# Patient Record
Sex: Female | Born: 1965
Health system: Southern US, Community
[De-identification: ages and names within clinical notes are randomized; demographics above are authoritative.]

## PROBLEM LIST (undated history)

## (undated) DIAGNOSIS — C801 Malignant (primary) neoplasm, unspecified: Secondary | ICD-10-CM

## (undated) DIAGNOSIS — I7300 Raynaud's syndrome without gangrene: Secondary | ICD-10-CM

## (undated) DIAGNOSIS — K589 Irritable bowel syndrome without diarrhea: Secondary | ICD-10-CM

## (undated) DIAGNOSIS — M542 Cervicalgia: Secondary | ICD-10-CM

## (undated) DIAGNOSIS — D649 Anemia, unspecified: Secondary | ICD-10-CM

## (undated) DIAGNOSIS — I8500 Esophageal varices without bleeding: Secondary | ICD-10-CM

## (undated) DIAGNOSIS — C220 Liver cell carcinoma: Secondary | ICD-10-CM

## (undated) DIAGNOSIS — E739 Lactose intolerance, unspecified: Secondary | ICD-10-CM

## (undated) DIAGNOSIS — K766 Portal hypertension: Secondary | ICD-10-CM

## (undated) DIAGNOSIS — J449 Chronic obstructive pulmonary disease, unspecified: Secondary | ICD-10-CM

## (undated) DIAGNOSIS — C50919 Malignant neoplasm of unspecified site of unspecified female breast: Secondary | ICD-10-CM

## (undated) DIAGNOSIS — D696 Thrombocytopenia, unspecified: Secondary | ICD-10-CM

## (undated) DIAGNOSIS — K7460 Unspecified cirrhosis of liver: Secondary | ICD-10-CM

## (undated) DIAGNOSIS — R519 Headache, unspecified: Secondary | ICD-10-CM

## (undated) DIAGNOSIS — K219 Gastro-esophageal reflux disease without esophagitis: Secondary | ICD-10-CM

## (undated) DIAGNOSIS — B1920 Unspecified viral hepatitis C without hepatic coma: Secondary | ICD-10-CM

## (undated) HISTORY — DX: Malignant (primary) neoplasm, unspecified: C80.1

## (undated) HISTORY — DX: Irritable bowel syndrome, unspecified: K58.9

## (undated) HISTORY — DX: Portal hypertension: K76.6

## (undated) HISTORY — DX: Unspecified cirrhosis of liver: K74.60

## (undated) HISTORY — DX: Lactose intolerance, unspecified: E73.9

## (undated) HISTORY — PX: COLONOSCOPY: SHX174

## (undated) HISTORY — DX: Cervicalgia: M54.2

## (undated) HISTORY — DX: Unspecified viral hepatitis C without hepatic coma: B19.20

## (undated) HISTORY — DX: Raynaud's syndrome without gangrene: I73.00

## (undated) HISTORY — DX: Thrombocytopenia, unspecified: D69.6

## (undated) HISTORY — DX: Gastro-esophageal reflux disease without esophagitis: K21.9

## (undated) SURGERY — ESOPHAGOSCOPY, WITH ESOPHAGEAL VARICES BAND LIGATION
Anesthesia: Monitor Anesthesia Care

---
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
## 1990-11-29 HISTORY — PX: PELVIC LAPAROSCOPY: SHX162

## 2000-11-29 HISTORY — PX: UPPER GASTROINTESTINAL ENDOSCOPY: SHX188

## 2002-03-20 ENCOUNTER — Encounter: Admission: RE | Admit: 2002-03-20 | Discharge: 2002-03-20 | Payer: Self-pay | Admitting: Endocrinology

## 2002-03-20 ENCOUNTER — Encounter: Payer: Self-pay | Admitting: Endocrinology

## 2002-08-16 ENCOUNTER — Encounter: Payer: Self-pay | Admitting: Endocrinology

## 2002-08-16 ENCOUNTER — Encounter: Admission: RE | Admit: 2002-08-16 | Discharge: 2002-08-16 | Payer: Self-pay | Admitting: Endocrinology

## 2002-09-28 ENCOUNTER — Encounter: Payer: Self-pay | Admitting: Pulmonary Disease

## 2002-09-28 ENCOUNTER — Ambulatory Visit (HOSPITAL_COMMUNITY): Admission: RE | Admit: 2002-09-28 | Discharge: 2002-09-28 | Payer: Self-pay | Admitting: Pulmonary Disease

## 2002-10-02 ENCOUNTER — Other Ambulatory Visit: Admission: RE | Admit: 2002-10-02 | Discharge: 2002-10-02 | Payer: Self-pay | Admitting: Gynecology

## 2002-12-13 ENCOUNTER — Encounter: Payer: Self-pay | Admitting: Internal Medicine

## 2003-05-10 ENCOUNTER — Encounter (INDEPENDENT_AMBULATORY_CARE_PROVIDER_SITE_OTHER): Payer: Self-pay | Admitting: *Deleted

## 2003-05-10 ENCOUNTER — Ambulatory Visit (HOSPITAL_COMMUNITY): Admission: RE | Admit: 2003-05-10 | Discharge: 2003-05-10 | Payer: Self-pay

## 2003-06-25 ENCOUNTER — Encounter: Payer: Self-pay | Admitting: Internal Medicine

## 2003-07-26 ENCOUNTER — Ambulatory Visit (HOSPITAL_COMMUNITY): Admission: RE | Admit: 2003-07-26 | Discharge: 2003-07-26 | Payer: Self-pay | Admitting: Internal Medicine

## 2003-07-26 ENCOUNTER — Encounter: Payer: Self-pay | Admitting: Internal Medicine

## 2003-10-01 ENCOUNTER — Other Ambulatory Visit: Admission: RE | Admit: 2003-10-01 | Discharge: 2003-10-01 | Payer: Self-pay | Admitting: Gynecology

## 2004-03-17 ENCOUNTER — Encounter: Payer: Self-pay | Admitting: Internal Medicine

## 2004-06-17 ENCOUNTER — Ambulatory Visit (HOSPITAL_COMMUNITY): Admission: RE | Admit: 2004-06-17 | Discharge: 2004-06-17 | Payer: Self-pay | Admitting: Gynecology

## 2004-06-17 ENCOUNTER — Encounter (INDEPENDENT_AMBULATORY_CARE_PROVIDER_SITE_OTHER): Payer: Self-pay | Admitting: Specialist

## 2004-08-10 ENCOUNTER — Emergency Department (HOSPITAL_COMMUNITY): Admission: EM | Admit: 2004-08-10 | Discharge: 2004-08-10 | Payer: Self-pay | Admitting: Emergency Medicine

## 2005-05-24 ENCOUNTER — Ambulatory Visit: Payer: Self-pay | Admitting: Pulmonary Disease

## 2005-09-01 ENCOUNTER — Other Ambulatory Visit: Admission: RE | Admit: 2005-09-01 | Discharge: 2005-09-01 | Payer: Self-pay | Admitting: Gynecology

## 2006-01-20 ENCOUNTER — Ambulatory Visit: Payer: Self-pay | Admitting: Gastroenterology

## 2006-03-11 ENCOUNTER — Ambulatory Visit (HOSPITAL_COMMUNITY): Admission: RE | Admit: 2006-03-11 | Discharge: 2006-03-11 | Payer: Self-pay | Admitting: Interventional Radiology

## 2006-03-18 ENCOUNTER — Ambulatory Visit: Payer: Self-pay | Admitting: Pulmonary Disease

## 2006-07-26 ENCOUNTER — Encounter: Admission: RE | Admit: 2006-07-26 | Discharge: 2006-08-04 | Payer: Self-pay | Admitting: Pulmonary Disease

## 2007-02-23 ENCOUNTER — Other Ambulatory Visit: Admission: RE | Admit: 2007-02-23 | Discharge: 2007-02-23 | Payer: Self-pay | Admitting: Gynecology

## 2007-03-22 ENCOUNTER — Ambulatory Visit: Payer: Self-pay | Admitting: Internal Medicine

## 2007-03-22 LAB — CONVERTED CEMR LAB
ALT: 142 units/L — ABNORMAL HIGH (ref 0–40)
Albumin: 3.5 g/dL (ref 3.5–5.2)
Alkaline Phosphatase: 81 units/L (ref 39–117)
Basophils Absolute: 0.1 10*3/uL (ref 0.0–0.1)
Basophils Relative: 0.6 % (ref 0.0–1.0)
Calcium: 9.7 mg/dL (ref 8.4–10.5)
Eosinophils Absolute: 0.1 10*3/uL (ref 0.0–0.6)
Glucose, Bld: 96 mg/dL (ref 70–99)
HCT: 43.3 % (ref 36.0–46.0)
Lymphocytes Relative: 24.4 % (ref 12.0–46.0)
MCHC: 35.1 g/dL (ref 30.0–36.0)
MCV: 93.1 fL (ref 78.0–100.0)
Neutro Abs: 5.9 10*3/uL (ref 1.4–7.7)
Platelets: 225 10*3/uL (ref 150–400)
RDW: 12.4 % (ref 11.5–14.6)
Total Bilirubin: 0.8 mg/dL (ref 0.3–1.2)

## 2007-03-23 ENCOUNTER — Encounter: Payer: Self-pay | Admitting: Internal Medicine

## 2007-04-04 ENCOUNTER — Ambulatory Visit: Payer: Self-pay | Admitting: Internal Medicine

## 2008-07-18 ENCOUNTER — Other Ambulatory Visit: Admission: RE | Admit: 2008-07-18 | Discharge: 2008-07-18 | Payer: Self-pay | Admitting: Obstetrics and Gynecology

## 2008-07-19 ENCOUNTER — Ambulatory Visit (HOSPITAL_COMMUNITY): Admission: RE | Admit: 2008-07-19 | Discharge: 2008-07-19 | Payer: Self-pay | Admitting: Obstetrics and Gynecology

## 2008-07-22 ENCOUNTER — Encounter (INDEPENDENT_AMBULATORY_CARE_PROVIDER_SITE_OTHER): Payer: Self-pay | Admitting: Obstetrics and Gynecology

## 2008-07-22 ENCOUNTER — Inpatient Hospital Stay (HOSPITAL_COMMUNITY): Admission: AD | Admit: 2008-07-22 | Discharge: 2008-07-22 | Payer: Self-pay | Admitting: Obstetrics and Gynecology

## 2010-05-19 ENCOUNTER — Encounter (INDEPENDENT_AMBULATORY_CARE_PROVIDER_SITE_OTHER): Payer: Self-pay | Admitting: *Deleted

## 2010-06-19 ENCOUNTER — Ambulatory Visit: Payer: Self-pay | Admitting: Internal Medicine

## 2010-06-19 DIAGNOSIS — B182 Chronic viral hepatitis C: Secondary | ICD-10-CM | POA: Insufficient documentation

## 2010-06-19 DIAGNOSIS — R1084 Generalized abdominal pain: Secondary | ICD-10-CM | POA: Insufficient documentation

## 2010-06-19 DIAGNOSIS — K219 Gastro-esophageal reflux disease without esophagitis: Secondary | ICD-10-CM | POA: Insufficient documentation

## 2010-06-19 DIAGNOSIS — R197 Diarrhea, unspecified: Secondary | ICD-10-CM | POA: Insufficient documentation

## 2010-06-19 LAB — CONVERTED CEMR LAB: IgA: 121 mg/dL (ref 68–378)

## 2010-06-22 ENCOUNTER — Telehealth: Payer: Self-pay | Admitting: Internal Medicine

## 2010-06-23 ENCOUNTER — Telehealth: Payer: Self-pay | Admitting: Internal Medicine

## 2010-08-26 ENCOUNTER — Ambulatory Visit: Payer: Self-pay | Admitting: Internal Medicine

## 2010-09-01 ENCOUNTER — Encounter: Payer: Self-pay | Admitting: Internal Medicine

## 2010-09-09 ENCOUNTER — Ambulatory Visit: Payer: Self-pay | Admitting: Internal Medicine

## 2010-09-09 DIAGNOSIS — K648 Other hemorrhoids: Secondary | ICD-10-CM | POA: Insufficient documentation

## 2010-09-09 DIAGNOSIS — K589 Irritable bowel syndrome without diarrhea: Secondary | ICD-10-CM | POA: Insufficient documentation

## 2010-11-12 ENCOUNTER — Telehealth (INDEPENDENT_AMBULATORY_CARE_PROVIDER_SITE_OTHER): Payer: Self-pay | Admitting: *Deleted

## 2010-11-29 HISTORY — PX: HAMMER TOE SURGERY: SHX385

## 2010-12-10 ENCOUNTER — Telehealth: Payer: Self-pay | Admitting: Internal Medicine

## 2010-12-20 ENCOUNTER — Encounter: Payer: Self-pay | Admitting: *Deleted

## 2010-12-29 NOTE — Progress Notes (Signed)
Summary: result  Phone Note Call from Patient Call back at [phone]   Caller: Patient Call For: Marina Goodell Reason for Call: Talk to Nurse, Lab or Test Results Summary of Call: Patient wants lab result from Friday. Initial call taken by: Tawni Levy,  June 22, 2010 11:47 AM  Follow-up for Phone Call        No answer,no machine at this number.  Teryl Lucy RN  June 23, 2010 9:18 AM  Notified of lab results.Will keep colon appt. Follow-up by: Teryl Lucy RN,  June 23, 2010 10:56 AM

## 2010-12-29 NOTE — Letter (Signed)
Summary: Patient Notice- Colon Biospy Results  [facility]  [address] [place], [place] [address]   Phone: [phone]  Fax: [phone]        September 01, 2010 MRN: [id number]    Theresa Rocha [address] [place], [place]  [address]    Dear Ms. Muchmore,  I am pleased to inform you that the biopsies taken during your recent colonoscopy were normal with no evidence of microscopic colitis or other abnormalities.  Additional information/recommendations:   __Please call [phone] to schedule a return visit to review      your condition.  __You should have a repeat colonoscopy examination for the purposes of routine screening in approximately 10 years.  Please call us if you are having persistent problems or have questions about your condition that have not been fully answered at this time.  Sincerely,  Hilarie Fredrickson MD   This letter has been electronically signed by your physician.  Appended Document: Patient Notice- Colon Biospy Results letter mailed

## 2010-12-29 NOTE — Assessment & Plan Note (Signed)
Summary: Followup post colonoscopy (IBS-diarrhea)   History of Present Illness Visit Type: Follow-up Visit Primary GI MD: Yancey Flemings MD Primary Provider: Renford Dills, MD  Requesting Provider: na Chief Complaint: F/u from colon. Pt c/o diarrhea  History of Present Illness:   45 year old with GERD complicated by peptic stricture, hepatitis C, psoriasis, and diarrhea predominant irritable bowel syndrome. Recent evaluations for ongoing problems with diarrhea. Negative celiac testing. Complete colonoscopy with intubation of the terminal ileum August 26, 2010 was normal. Random colon biopsies and normal. She was prescribed Levbid. Twice daily dosage resulted in some dizziness. Once daily dosage tolerated and may be somewhat helpful. Generally has one loose bowel movement in the morning after her meal. 3-4 days per month, however she will have multiple loose postprandial bowel movements. She has not tried other therapies. We have reviewed her workup. She does complain of some rectal discomfort with defecation when her diarrhea is worse.   GI Review of Systems      Denies abdominal pain, acid reflux, belching, bloating, chest pain, dysphagia with liquids, dysphagia with solids, heartburn, loss of appetite, nausea, vomiting, vomiting blood, weight loss, and  weight gain.      Reports diarrhea.     Denies anal fissure, black tarry stools, change in bowel habit, constipation, diverticulosis, fecal incontinence, heme positive stool, hemorrhoids, irritable bowel syndrome, jaundice, light color stool, liver problems, rectal bleeding, and  rectal pain.    Current Medications (verified): 1)  Levbid 0.375 Mg Xr12h-Tab (Hyoscyamine Sulfate) .... Take 1 By Mouth Two Times A Day 2)  Hydrocodone-Acetaminophen 7.5-325 Mg Tabs (Hydrocodone-Acetaminophen) .... As Needed For Pain 3)  Promethazine Hcl 25 Mg Tabs (Promethazine Hcl) .... Will Take With Pain Medicine  Allergies (verified): 1)  ! Asa 2)  ! *  Dilaudid  Past History:  Past Medical History: Chronic Hepatitis C Irritable Bowel Syndrome Esophageal Stricture Internal hemorrhoids  GERD  Past Surgical History: Left foot surgery   Family History: Lung Cancer: Father Family History of Diabetes: MGM Family History of Liver Disease/Cirrhosis:MGM No FH of Colon Cancer:  Social History: Reviewed history from 06/19/2010 and no changes required. Occupation: Interventional Radiology Patient currently smokes.  Alcohol Use - yes Daily Caffeine Use Illicit Drug Use - no  Review of Systems  The patient denies allergy/sinus, anemia, anxiety-new, arthritis/joint pain, back pain, blood in urine, breast changes/lumps, change in vision, confusion, cough, coughing up blood, depression-new, fainting, fatigue, fever, headaches-new, hearing problems, heart murmur, heart rhythm changes, itching, menstrual pain, muscle pains/cramps, night sweats, nosebleeds, pregnancy symptoms, shortness of breath, skin rash, sleeping problems, sore throat, swelling of feet/legs, swollen lymph glands, thirst - excessive , urination - excessive , urination changes/pain, urine leakage, vision changes, and voice change.    Vital Signs:  Patient profile:   45 year old female Height:      63 inches Weight:      112 pounds BMI:     19.91 BSA:     1.51 Pulse rate:   88 / minute Pulse rhythm:   regular BP sitting:   120 / 60  (left arm) Cuff size:   regular  Vitals Entered By: Ok Anis CMA (September 09, 2010 2:07 PM)  Physical Exam  General:  Well developed, well nourished, no acute distress. Lungs:  Clear throughout to auscultation. Heart:  Regular rate and rhythm; no murmurs, rubs,  or bruits. Abdomen:  Soft, nontender and nondistended. No masses, hepatosplenomegaly or hernias noted. Normal bowel sounds. Pulses:  Normal pulses noted. Neurologic:  alert and oriented Skin:  psoriasis Psych:  Alert and cooperative. Normal mood and affect.   Impression  & Recommendations:  Problem # 1:  IBS (ICD-564.1) diarrhea predominant irritable bowel syndrome. Ongoing.  Plan: #1. Probiotic Align one daily for 2 weeks. Samples given #2. Prescribed Librax one p.o. a.c. and h.s. p.r.n. Do not take with Levbid #3. Imodium p.r.n. #4. Follow p.r.n.  Problem # 2:  HEMORRHOIDS, INTERNAL (ICD-455.0) occasional intermittent discomfort in the rectum likely due to known hemorrhoids  Plan: #1. Anusol-HC suppositories p.r.n.Marland Kitchen Prescribed  Problem # 3:  GERD (ICD-530.81) currently asymptomatic.  Problem # 4:  HEPATITIS C-CHRONIC WITHOUT COMA (ICD-070.54) followed previously at the medical specialist clinic. They wish to reestablish care given newer therapies  Patient Instructions: 1)  Align Samples given to patient to take 1 by mouth once daily 2)  x 2 weeks. 3)  Librax Rx. sent to pharmacy #100 take 1 before each meal and at bedtime as needed 4)  Take Immodium as needed (purchase over the counter) 5)  annusol HC Supp. #30 1 per rectum once daily as needed 6)  Copy sent to : Renford Dills, MD  7)  The medication list was reviewed and reconciled.  All changed / newly prescribed medications were explained.  A complete medication list was provided to the patient / caregiver. Prescriptions: ANUSOL-HC 25 MG SUPP (HYDROCORTISONE ACETATE) 1 per rectum once daily as needed  #30 x 1   Entered by:   Milford Cage NCMA   Authorized by:   Hilarie Fredrickson MD   Signed by:   Milford Cage NCMA on 09/09/2010   Method used:   Electronically to        [id number] (retail)       [address]       [place], [place]  [address]       Ph: [phone] or [phone]       Fax: [phone]   RxID:   [id number] LIBRAX 2.5-5 MG CAPS (CLIDINIUM-CHLORDIAZEPOXIDE) 1 by mouth before meals and at bedtime as needed  #100 x 3   Entered by:   Milford Cage NCMA   Authorized by:   Hilarie Fredrickson MD   Signed by:   Milford Cage NCMA on 09/09/2010   Method used:    Electronically to        [facility] [phone]* (retail)       [address]       [place], [place]  [address]       Ph: [phone] or [phone]       Fax: [phone]   RxID:   [id number]

## 2010-12-29 NOTE — Letter (Signed)
Summary: New Patient letter  [facility]  [address] [place], [place] [address]   Phone: [phone]  Fax: [phone]       05/19/2010 MRN: [id number]  Theresa Rocha [address] [place], [place]  [address]  Dear Ms. Theresa Rocha,  Welcome to the Gastroenterology Division at [facility].    You are scheduled to see Dr.  Marina Goodell on 06-19-10 at 3:30p.m. on the 3rd floor at [facility], [address]  We ask that you try to arrive at our office 15 minutes prior to your appointment time to allow for check-in.  We would like you to complete the enclosed self-administered evaluation form prior to your visit and bring it with you on the day of your appointment.  We will review it with you.  Also, please bring a complete list of all your medications or, if you prefer, bring the medication bottles and we will list them.  Please bring your insurance card so that we may make a copy of it.  If your insurance requires a referral to see a specialist, please bring your referral form from your primary care physician.  Co-payments are due at the time of your visit and may be paid by cash, check or credit card.     Your office visit will consist of a consult with your physician (includes a physical exam), any laboratory testing he/she may order, scheduling of any necessary diagnostic testing (e.g. x-ray, ultrasound, CT-scan), and scheduling of a procedure (e.g. Endoscopy, Colonoscopy) if required.  Please allow enough time on your schedule to allow for any/all of these possibilities.    If you cannot keep your appointment, please call [phone] to cancel or reschedule prior to your appointment date.  This allows [place] the opportunity to schedule an appointment for another patient in need of care.  If you do not cancel or reschedule by 5 p.m. the business day prior to your appointment date, you will be charged a $50.00 late cancellation/no-show fee.    Thank you for choosing [facility] for your medical needs.  We appreciate the opportunity to care for you.  Please visit [place] at our website  to learn more about our practice.                     Sincerely,                                                             The Gastroenterology Division

## 2010-12-29 NOTE — Progress Notes (Signed)
Summary: ? re meds  Phone Note Call from Patient Call back at [phone]   Caller: Patient Call For: Marina Goodell Reason for Call: Talk to Nurse Summary of Call: Patient has questions regarding her medication Initial call taken by: Tawni Levy,  June 23, 2010 3:48 PM  Follow-up for Phone Call        No answer-no machine.  Teryl Lucy RN  June 23, 2010 4:30 p.m. Pt. requesting to cut back Levbid to once a day.Has worked in decreasing stool right after eating and cramping but feels b.i.d. dose makes her dizzy. Follow-up by: Teryl Lucy RN,  June 24, 2010 9:18 AM  Additional Follow-up for Phone Call Additional follow up Details #1::        she can try one daily or 1/2 two times a day. Additional Follow-up by: Hilarie Fredrickson MD,  June 24, 2010 12:00 PM    Additional Follow-up for Phone Call Additional follow up Details #2::    Pt. ntfd. of Dr.Ambrielle Kington's recommendations Follow-up by: Teryl Lucy RN,  June 24, 2010 12:20 PM

## 2010-12-29 NOTE — Consult Note (Signed)
Summary: [employer] [facility] [employer]   Imported By: Sherian Rein 08/25/2010 07:37:52  _____________________________________________________________________  External Attachment:    Type:   Image     Comment:   External Document

## 2010-12-29 NOTE — Progress Notes (Signed)
Summary: [facility]   Imported By: Sherian Rein 08/25/2010 07:39:06  _____________________________________________________________________  External Attachment:    Type:   Image     Comment:   External Document

## 2010-12-29 NOTE — Letter (Signed)
Summary: [facility] Instructions  [facility]  [address] [place], [place] [address]   Phone: [phone]  Fax: [phone]       ELVERNA CAFFEE    1966/11/21    MRN: [id number]        Procedure Day /Date:THURSDAY, 08/26/10     Arrival Time:7:30 AM     Procedure Time:8:00 AM     Location of Procedure:                    [facility] (4th Floor)                        PREPARATION FOR COLONOSCOPY WITH MOVIPREP   Starting 5 days prior to your procedure 08/21/10 eat nuts, seeds, popcorn, corn, beans, peas,  salads, or any raw vegetables.  Do not take any fiber supplements (e.g. Metamucil, Citrucel, and Benefiber).  THE DAY BEFORE YOUR PROCEDURE         DATE: 08/25/10 WEDNESDAY  1.  Drink clear liquids the entire day-NO SOLID FOOD  2.  Do not drink anything colored red or purple.  Avoid juices with pulp.  No orange juice.  3.  Drink at least 64 oz. (8 glasses) of fluid/clear liquids during the day to prevent dehydration and help the prep work efficiently.  CLEAR LIQUIDS INCLUDE: Water Jello Ice Popsicles Tea (sugar ok, no milk/cream) Powdered fruit flavored drinks Coffee (sugar ok, no milk/cream) Gatorade Juice: apple, white grape, white cranberry  Lemonade Clear bullion, consomm, broth Carbonated beverages (any kind) Strained chicken noodle soup Hard Candy                             4.  In the morning, mix first dose of MoviPrep solution:    Empty 1 Pouch A and 1 Pouch B into the disposable container    Add lukewarm drinking water to the top line of the container. Mix to dissolve    Refrigerate (mixed solution should be used within 24 hrs)  5.  Begin drinking the prep at 5:00 p.m. The MoviPrep container is divided by 4 marks.   Every 15 minutes drink the solution down to the next mark (approximately 8 oz) until the full liter is complete.   6.  Follow completed prep with 16 oz of clear liquid of your choice (Nothing red or purple).   Continue to drink clear liquids until bedtime.  7.  Before going to bed, mix second dose of MoviPrep solution:    Empty 1 Pouch A and 1 Pouch B into the disposable container    Add lukewarm drinking water to the top line of the container. Mix to dissolve    Refrigerate  THE DAY OF YOUR PROCEDURE      DATE  08/26/10 THURSDAY  Beginning at 3:00 AM hours before procedure):         1. Every 15 minutes, drink the solution down to the next mark (approx 8 oz) until the full liter is complete.  2. Follow completed prep with 16 oz. of clear liquid of your choice.    3. You may drink clear liquids until 6:00 AM HOURS BEFORE PROCEDURE).   MEDICATION INSTRUCTIONS  Unless otherwise instructed, you should take regular prescription medications with a small sip of water   as early as possible the morning of your procedure.         OTHER  INSTRUCTIONS  You will need a responsible adult at least 44 years of age to accompany you and drive you home.   This person must remain in the waiting room during your procedure.  Wear loose fitting clothing that is easily removed.  Leave jewelry and other valuables at home.  However, you may wish to bring a book to read or  an iPod/MP3 player to listen to music as you wait for your procedure to start.  Remove all body piercing jewelry and leave at home.  Total time from sign-in until discharge is approximately 2-3 hours.  You should go home directly after your procedure and rest.  You can resume normal activities the  day after your procedure.  The day of your procedure you should not:   Drive   Make legal decisions   Operate machinery   Drink alcohol   Return to work  You will receive specific instructions about eating, activities and medications before you leave.    The above instructions have been reviewed and explained to me by   _______________________    I fully understand and can verbalize these instructions  _____________________________ Date _________

## 2010-12-29 NOTE — Procedures (Signed)
Summary: EGD   EGD  Procedure date:  07/26/2003  Findings:      Location: [facility]  Findings: Esophagitis  Findings: Stricture:  GERD Patient Name: Lodema, Parma MRN:  Procedure Procedures: Panendoscopy (EGD) CPT: 43235.    with esophageal dilation. CPT: G9296129.  Personnel: Endoscopist: Wilhemina Bonito. Marina Goodell, MD.  Exam Location: Exam performed in Endoscopy Suite.  Patient Consent: Procedure, Alternatives, Risks and Benefits discussed, consent obtained,  Indications Symptoms: Dysphagia.  History  Pre-Exam Physical: Performed Jul 26, 2003  Entire physical exam was normal.  Exam Exam Info: Maximum depth of insertion Duodenum, intended Duodenum. Patient position: on left side. Vocal cords visualized. Gastric retroflexion performed. Images taken. ASA Classification: II. Tolerance: excellent.  Sedation Meds: Demerol 70 mg. given IV. Versed 7 mg. given IV.  Monitoring: BP and pulse monitoring done. Oximetry used. Supplemental O2 given  Fluoroscopy: Fluoroscopy was used.  Findings HIATAL HERNIA: Comments: small.  STRICTURE / STENOSIS: Stricture in Distal Esophagus.  Constriction: partial. Etiology: benign due to reflux. 38 cm from mouth. Lumen diameter is 14 mm. ICD9: Esophageal Stricture: 530.3. Comment: mild esophagitis (edema/erythema) also present.  - Dilation: Distal Esophagus. Procedure was performed under Fluoroscopy. Wire Guided/Savary (Wilson-Cook) dilator used, Diameter: 17 mm, No Resistance, No Heme present on extraction. 1  total dilators used. Patient tolerance excellent.   Assessment Abnormal examination, see findings above.  Diagnoses: 530.3: Esophageal Stricture.  530.11: Esophagitis, Reflux.  530.81: GERD.   Events  Unplanned Intervention: No unplanned interventions were required.  Unplanned Events: There were no complications. Plans Instructions: Nothing to eat or drink for 2hrs.  Clear or full liquids: 2 hrs. Resume previous diet: am.    Medication(s): PPI: Esomeprazole/Nexium 40 mg QD,   Disposition: After procedure patient sent to recovery. After recovery patient sent home.   This report was created from the original endoscopy report, which was reviewed and signed by the above listed endoscopist.   cc:  Alroy Dust, MD      The Patient

## 2010-12-29 NOTE — Assessment & Plan Note (Signed)
Summary: DIARRHEA//ABD PAIN--   History of Present Illness Visit Type: Initial Visit Primary GI MD: Yancey Flemings MD Primary Provider: Renford Dills, MD Chief Complaint: Chornic diarrhea, severe lower abdominla pain x6 months History of Present Illness:   45 year old with a history of GERD complicated by peptic stricture, hepatitis C, probable diarrhea predominant irritable bowel syndrome, and psoriasis. She presents today for worsening abdominal pain and diarrhea. She has had chronic intermittent problems with cramping lower abdominal discomfort and diarrhea, generally after meals. Particularly the morning meal. Her problems have been more severe over the past 6 months. Her weight has been stable and there has been no bleeding. She does complain of bloating and nausea as well as acid reflux. No dysphagia. Her weight has been stable.   GI Review of Systems    Reports abdominal pain, acid reflux, bloating, and  nausea.     Location of  Abdominal pain: lower abdomen.    Denies belching, chest pain, dysphagia with liquids, dysphagia with solids, heartburn, loss of appetite, vomiting, vomiting blood, weight loss, and  weight gain.      Reports diarrhea and  liver problems.     Denies anal fissure, black tarry stools, change in bowel habit, constipation, diverticulosis, fecal incontinence, heme positive stool, hemorrhoids, irritable bowel syndrome, jaundice, light color stool, rectal bleeding, and  rectal pain. Preventive Screening-Counseling & Management  Alcohol-Tobacco     Smoking Status: current      Drug Use:  no.      Current Medications (verified): 1)  None  Allergies (verified): 1)  ! Asa 2)  ! * Dilaudid  Past History:  Past Medical History: Chronic Hepatitis C Irritable Bowel Syndrome Esophageal Stricture  Past Surgical History: Unremarkable  Family History: Lung Cancer: Father Family History of Diabetes: MGM Family History of Liver Disease/Cirrhosis:MGM  Social  History: Occupation: Interventional Radiology Patient currently smokes.  Alcohol Use - yes Daily Caffeine Use Illicit Drug Use - no Smoking Status:  current Drug Use:  no  Review of Systems       The patient complains of fatigue.  The patient denies allergy/sinus, anemia, anxiety-new, arthritis/joint pain, back pain, blood in urine, breast changes/lumps, change in vision, confusion, cough, coughing up blood, depression-new, fainting, fever, headaches-new, hearing problems, heart murmur, heart rhythm changes, itching, menstrual pain, muscle pains/cramps, night sweats, nosebleeds, pregnancy symptoms, shortness of breath, skin rash, sleeping problems, sore throat, swelling of feet/legs, swollen lymph glands, thirst - excessive , urination - excessive , urination changes/pain, urine leakage, vision changes, and voice change.    Vital Signs:  Patient profile:   45 year old female Height:      63 inches Weight:      113.50 pounds BMI:     20.18 Pulse rate:   80 / minute Pulse rhythm:   regular BP sitting:   92 / 58  (left arm) Cuff size:   regular  Vitals Entered By: June McMurray CMA Duncan Dull) (June 19, 2010 3:22 PM)  Physical Exam  General:  Well developed, well nourished, no acute distress. Head:  Normocephalic and atraumatic. Eyes:  PERRLA, no icterus. Nose:  No deformity, discharge,  or lesions. Mouth:  No deformity or lesions Neck:  Supple; no masses or thyromegaly. Lungs:  Clear throughout to auscultation. Heart:  Regular rate and rhythm; no murmurs, rubs,  or bruits. Abdomen:  Soft, nontender and nondistended. No masses, hepatosplenomegaly or hernias noted. Normal bowel sounds. Rectal:  deferred until colonoscopy Msk:  Symmetrical with no gross deformities.  Normal posture. Pulses:  Normal pulses noted. Extremities:  No clubbing, cyanosis, edema or deformities noted. Neurologic:  Alert and  oriented x4; Skin:  psoriatic plaques on the legs and arms Psych:  Alert and  cooperative. Normal mood and affect.   Impression & Recommendations:  Problem # 1:  DIARRHEA (ICD-787.91) problems with chronic postprandial diarrhea associated with abdominal cramping. Felt to have irritable bowel syndrome. Problems worse over the past 6 months.  Plan: #1. Obtain tissue transglutaminase antibody and serum IgA level to screen for celiac sprue #2. Colonoscopy with biopsies to exclude microscopic colitis. Can also evaluate for ileal Crohn's disease. The nature of the procedure as well as the risks, benefits, and alternatives were reviewed. She understood and agreed to proceed #3. Movi prep prescribed. Patient instructed on its use  Problem # 2:  ABDOMINAL PAIN -GENERALIZED (ICD-789.07) pain felt to be secondary to irritable bowel. Ruling out other conditions as outlined above.  Plan: #1. Prescribed Levbid 0.375 mg p.o. b.i.d. p.r.n.  Problem # 3:  GERD (ICD-530.81) having symptoms on no medication. Does have a history of peptic stricture though no dysphagia now.  Plan: #1. PPI therapy on demand  Problem # 4:  HEPATITIS C-CHRONIC  (ICD-070.54) previous evaluation per medical specialist clinic  Other Orders: Colonoscopy (Colon) T-Tissue Transglutamase Ab IgA ([id number]) TLB-IgA (Immunoglobulin A) (82784-IGA)  Patient Instructions: 1)  Colon [facility] 08/26/10 8:00 am arrive at 7:00 am 2)  Movi prep instructions given and Rx. for Movi prep and Levbid sent to pharmacy. 3)  Labs ordered for you to have drawn today . 4)  Colonoscopy and Flexible Sigmoidoscopy brochure given.  5)  The medication list was reviewed and reconciled.  All changed / newly prescribed medications were explained.  A complete medication list was provided to the patient / caregiver. 6)  Copy: Dr. Renford Dills Prescriptions: LEVBID 0.375 MG XR12H-TAB (HYOSCYAMINE SULFATE) take 1 by mouth two times a day  #60 x 6   Entered by:   Milford Cage NCMA   Authorized by:   Hilarie Fredrickson MD   Signed by:    Milford Cage NCMA on 06/19/2010   Method used:   Electronically to        [facility] [phone]* (retail)       [address]       [place], [place]  [address]       Ph: [phone] or [phone]       Fax: [phone]   RxID:   [id number] MOVIPREP 100 GM  SOLR (PEG-KCL-NACL-NASULF-NA ASC-C) As per prep instructions.  #1 x 0   Entered by:   Milford Cage NCMA   Authorized by:   Hilarie Fredrickson MD   Signed by:   Milford Cage NCMA on 06/19/2010   Method used:   Electronically to        [phone] (retail)       [address]       [place], [place]  [address]       Ph: [phone] or [phone]       Fax: [phone]   RxID:   [id number]

## 2010-12-29 NOTE — Procedures (Signed)
Summary: Colonoscopy  Patient: Theresa Rocha Note: All result statuses are Final unless otherwise noted.  Tests: (1) Colonoscopy (COL)   COL Colonoscopy           DONE     [facility]     [address]     [place], [place]  [address]           COLONOSCOPY PROCEDURE REPORT           PATIENT:  Theresa Rocha, Theresa Rocha  MR#:  [id number]     BIRTHDATE:  05/13/1966, 44 yrs. old  GENDER:  female     ENDOSCOPIST:  Wilhemina Bonito. Eda Keys, MD     REF. BY:  Office     PROCEDURE DATE:  08/26/2010     PROCEDURE:  Diagnostic Colonoscopy     ASA CLASS:  Class II     INDICATIONS:  unexplained diarrhea, Abdominal pain     MEDICATIONS:   Fentanyl 100 mcg IV, Versed 10 mg IV           DESCRIPTION OF PROCEDURE:   After the risks benefits and     alternatives of the procedure were thoroughly explained, informed     consent was obtained.  Digital rectal exam was performed and     revealed no abnormalities.   The LB CF-H180AL [id number] endoscope     was introduced through the anus and advanced to the cecum, which     was identified by both the appendix and ileocecal valve, without     limitations.Time to cecum = 5:25 min.  The quality of the prep was     good, using MoviPrep.  The instrument was then slowly withdrawn     (time = 8:40 min) as the colon was fully examined.     <<PROCEDUREIMAGES>>           FINDINGS:  A normal appearing cecum, ileocecal valve, and     appendiceal orifice were identified. The ascending, hepatic     flexure, transverse, splenic flexure, descending, sigmoid colon,     and rectum appeared unremarkable. Random colon biopsies taken. The     terminal ileum appeared normal.   Retroflexed views in the rectum     revealed internal hemorrhoids.    The scope was then withdrawn     from the patient and the procedure completed.           COMPLICATIONS:  None     ENDOSCOPIC IMPRESSION:     1) Normal colon     2) Normal terminal ileum     3) Internal hemorrhoids     4) Suspect Irritable  bowel syndrome           RECOMMENDATIONS:     1) Await biopsy results     2) Continue current colorectal screening recommendations for     "routine risk" patients with a repeat colonoscopy in 10 years.     3) call office next 1-3 days to schedule followup visit in a few     weeks to review results and recommendations           ______________________________     Wilhemina Bonito. Eda Keys, MD           CC:  Renford Dills MD; The Patient           n.     eSIGNED:   Wilhemina Bonito. Eda Keys at 08/26/2010 08:55 AM  Kandy, Towery, [phone]  Note: An exclamation mark (!) indicates a result that was not dispersed into the flowsheet. Document Creation Date: 08/26/2010 8:57 AM _______________________________________________________________________  (1) Order result status: Final Collection or observation date-time: 08/26/2010 08:47 Requested date-time:  Receipt date-time:  Reported date-time:  Referring Physician:   Ordering Physician: Fransico Setters [phone]) Specimen Source:  Source: Launa Grill Order Number: [id number] Lab site:   Appended Document: Colonoscopy    Clinical Lists Changes  Observations: Added new observation of COLONNXTDUE: 07/2020 (08/26/2010 11:00)      Appended Document: Colonoscopy     Procedures Next Due Date:    Colonoscopy: 07/2020

## 2010-12-31 NOTE — Progress Notes (Signed)
Summary: ? re fmla paperwork  Phone Note Call from Patient Call back at [phone]   Caller: Patient Call For: Dr Marina Goodell Reason for Call: Talk to Nurse Summary of Call: Patient wants to know why were her fmla paperwork denied. Initial call taken by: Tawni Levy,  December 10, 2010 1:13 PM  Follow-up for Phone Call        Explained why Dr.Joushua Dugar does not sign FMLA papers for [employer].Advised she should make r.o.v. appt if meds as ordered aren't working.Marland Kitchen Marland KitchenWorried about taking Immodium .Advised to use as directed on the box and use her Librax on a more regular basis.  Follow-up by: Teryl Lucy RN,  December 14, 2010 10:53 AM

## 2010-12-31 NOTE — Progress Notes (Signed)
  Phone Note Other Incoming   Request: Send information Summary of Call: Completed [place] medical release, [employer] documents and patient's FMLA paperwork received. Information forwarded to [employer].

## 2011-01-01 NOTE — Progress Notes (Signed)
Summary: [facility]   Imported By: Sherian Rein 08/25/2010 07:40:09  _____________________________________________________________________  External Attachment:    Type:   Image     Comment:   External Document

## 2011-04-16 NOTE — Assessment & Plan Note (Signed)
[facility]                         GASTROENTEROLOGY OFFICE NOTE   KALIANN, Theresa Rocha                        MRN:          [id number]  DATE:03/22/2007                            DOB:          1965-12-03    HISTORY:  This is a 45 year old white female with a history of hepatitis  C, psoriasis, and chronic diarrhea felt due to lactose intolerance  and/or irritable bowel.  She was last seen in this office in April of  2005 for diarrhea.  She has not been seen since.  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  She contacted the office  today requesting to be seen for problems with diarrhea.  She reports to  me that she has chronic problems with postprandial diarrhea depending on  what she eats.  Worse with dairy products, though not exclusive to dairy  products.  Also worse with stress.  She apparently left her husband this  past winter, which resulted in increasing problems with diarrhea and  some transient weight loss.  This improved to her baseline and she  actually had some weight gain.  She states she was doing okay until 3 to  4 days ago when she developed worsening problems with diarrhea, multiple  stools per day, almost exclusively worsened with meals.  No nocturnal  symptoms.  Some nausea, but no vomiting.  She has had some mucus in her  stool, but no blood.  She has some sharp cramping in the lower abdomen,  laterally.  She has had this intermittently.  She has mentioned chills,  but no fever.  She has had some antibiotics back in January and last  year for gynecologic infections.  She thinks she was given metronidazole  only.  No other issues or problems.  She states she has been in contact  with the hepatitis outpatient clinic at [facility].   PHYSICAL EXAM:  Thin, tanned female in no acute distress.  Blood pressure 106/72, heart rate is 88, weight is 113.8 pounds.  HEENT:  Sclerae anicteric.  Conjunctivae pink.  Oral  mucosa intact.  No adenopathy.  LUNGS:  Clear.  HEART:  Regular.  ABDOMEN:  Soft without tenderness, mass, or hernia, good bowel sounds  heard.  RECTAL:  Deferred.  EXTREMITIES:  Without edema.   IMPRESSION:  Acute diarrheal illness, possible viral gastroenteritis.  Could be exacerbation of irritable bowel syndrome.  No particularly  worrisome features by history or physical exam.   RECOMMENDATIONS:  1. CBC and comprehensive metabolic panel today.  2. Beta hCG.  The patient requested this, wondering if she is      pregnant, though she states she had a period last weekend but that      they have been scant.  We will have this performed for her.  3. Stool studies including ova and parasite, culture for enteric      pathogens, Clostridium difficile, and fecal leukocytes.  4. Hemoccult studies.  5. Levbid 0.375 mg b.i.d.  6. Lomotil 1 to 2 q.6h p.r.n.  7. Brat diet, advance as tolerated.  8.  Office visit in 2 to 3 weeks if still having problems.     Wilhemina Bonito. Marina Goodell, MD  Electronically Signed    JNP/MedQ  DD: 03/22/2007  DT: 03/22/2007  Job #: [id number]   cc:   Lonzo Cloud. Kriste Basque, MD  Leatha Gilding Mezer, M.D.

## 2011-04-16 NOTE — Assessment & Plan Note (Signed)
[facility]                         GASTROENTEROLOGY OFFICE NOTE   LAYLANA, GERWIG                        MRN:          [id number]  DATE:04/04/2007                            DOB:          01-14-1966    HISTORY:  Theresa Rocha presents today for followup. She was evaluated on March 22, 2007, for an acute diarrheal illness felt to possibly represent a  viral gastroenteritis. See that dictation for details. CBC and beta HCG  were negative. Comprehensive metabolic panel was normal except for  elevated liver tests secondary to known hepatitis C. Stool studies were  negative. Hemoccult cards were not submitted. She took Lomotil and  within a day her symptoms resolved. She states that to me that she will  get urgency and loose stools when she eats certain foods which are  greasy. Otherwise, she is well.   PHYSICAL EXAMINATION:  Finds a well-appearing female in no acute  distress. Blood pressure 100/60, heart rate 82, weight 114.4 pounds  (increased 0.8 pounds).  HEENT: Sclerae anicteric.  ABDOMEN: Soft without tenderness, mass or hernia.  EXTREMITIES: Are without edema.   IMPRESSION:  1. Recent acute diarrheal illness likely secondary to infectious      agent. Probable virus.  2. Background symptoms consistent with irritable bowel syndrome.  3. However, chronic hepatitis C.   RECOMMENDATIONS:  1. Given Levbid to take on an as needed basis for more irritable bowel      syndrome symptoms.  2. [facility] of [facility].  3. Resume general medical care with Dr.  Kriste Basque.     Wilhemina Bonito. Marina Goodell, MD  Electronically Signed    JNP/MedQ  DD: 04/04/2007  DT: 04/04/2007  Job #: [id number]   cc:   Lonzo Cloud. Kriste Basque, MD  Leatha Gilding Mezer, M.D.

## 2011-04-16 NOTE — Op Note (Signed)
NAME:  Theresa Rocha, Theresa Rocha                           ACCOUNT NO.:  [id number]   MEDICAL RECORD NO.:  [id number]                   PATIENT TYPE:  AMB   LOCATION:  SDC                                  FACILITY:  [facility]   PHYSICIAN:  Howard C. Mezer, M.D.               DATE OF BIRTH:  12-Feb-1966   DATE OF PROCEDURE:  06/17/2004  DATE OF DISCHARGE:                                 OPERATIVE REPORT   PREOPERATIVE DIAGNOSES:  Endometrial polyp.   POSTOPERATIVE DIAGNOSES:  Endometrial polyp.   OPERATION PERFORMED:  Hysteroscopy D&C and removal of endometrial polyp.   ANESTHESIA:  MAC plus paracervical block.   SURGEON:  Leatha Gilding. Mezer, M.D.   DESCRIPTION OF PROCEDURE:  With the patient in lithotomy position, she was  prepped and draped in a routine fashion. The uterus sounded to just over 7  cm and the cervix very easily dilated. The hysteroscope was introduced and  Hyskon was used as a distention medium.  The endocervical canal appeared to  be normal. Although the lighting was suboptimal, the cavity was very  adequately explored with a polyp emanating from the anterior left side of  the uterus located.  This had normal vascular markings and was not  suspicious for malignancy. The remainder of the cavity was normal, both  tubal ostia were identified.  Grasping forceps was introduced and the polyp  removed including the base.  The cavity was then sharply curetted productive  of a moderate amount of tissue.  The hysteroscope was reintroduced and no  additional polypoid tissue was noted. There was minimal bleeding into the  procedure.  A paracervical block of 1% Xylocaine had been placed at the  beginning of the procedure. The estimated blood loss was less than 20 mL and  the sponge, instrument and needle counts were correct.  The patient  tolerated the procedure well and was taken to the recovery room in  satisfactory condition.                                               Leatha Gilding. Mezer,  M.D.    HCM/MEDQ  D:  06/17/2004  T:  06/17/2004  Job:  [id number]   cc:   Lonzo Cloud. Kriste Basque, M.D. [facility]

## 2012-01-03 ENCOUNTER — Other Ambulatory Visit: Payer: Self-pay | Admitting: Internal Medicine

## 2012-01-03 DIAGNOSIS — Z1231 Encounter for screening mammogram for malignant neoplasm of breast: Secondary | ICD-10-CM

## 2012-01-06 ENCOUNTER — Other Ambulatory Visit: Payer: Self-pay | Admitting: Obstetrics and Gynecology

## 2012-01-07 ENCOUNTER — Other Ambulatory Visit: Payer: Self-pay | Admitting: Gynecology

## 2012-01-07 DIAGNOSIS — R928 Other abnormal and inconclusive findings on diagnostic imaging of breast: Secondary | ICD-10-CM

## 2012-01-10 ENCOUNTER — Ambulatory Visit
Admission: RE | Admit: 2012-01-10 | Discharge: 2012-01-10 | Disposition: A | Discharge status: Home / Self Care | Payer: BC Managed Care – PPO | Source: Ambulatory Visit | Attending: Gynecology | Admitting: Gynecology

## 2012-01-10 DIAGNOSIS — R928 Other abnormal and inconclusive findings on diagnostic imaging of breast: Secondary | ICD-10-CM

## 2012-01-11 ENCOUNTER — Ambulatory Visit: Payer: Self-pay

## 2013-05-11 ENCOUNTER — Other Ambulatory Visit: Payer: Self-pay | Admitting: Internal Medicine

## 2013-05-11 ENCOUNTER — Other Ambulatory Visit (HOSPITAL_COMMUNITY): Payer: Self-pay | Admitting: Internal Medicine

## 2013-05-11 ENCOUNTER — Ambulatory Visit (HOSPITAL_COMMUNITY)
Admission: RE | Admit: 2013-05-11 | Discharge: 2013-05-11 | Disposition: A | Discharge status: Home / Self Care | Payer: BC Managed Care – PPO | Source: Ambulatory Visit | Attending: Internal Medicine | Admitting: Internal Medicine

## 2013-05-11 DIAGNOSIS — Z801 Family history of malignant neoplasm of trachea, bronchus and lung: Secondary | ICD-10-CM | POA: Insufficient documentation

## 2013-05-11 DIAGNOSIS — F17200 Nicotine dependence, unspecified, uncomplicated: Secondary | ICD-10-CM | POA: Insufficient documentation

## 2013-05-11 DIAGNOSIS — R059 Cough, unspecified: Secondary | ICD-10-CM

## 2013-05-11 DIAGNOSIS — R05 Cough: Secondary | ICD-10-CM | POA: Insufficient documentation

## 2013-05-11 DIAGNOSIS — F17210 Nicotine dependence, cigarettes, uncomplicated: Secondary | ICD-10-CM

## 2013-05-11 MED ORDER — IOHEXOL 300 MG/ML  SOLN
80.0000 mL | Freq: Once | INTRAMUSCULAR | Status: AC | PRN
  Administered 2013-05-11: 80 mL via INTRAVENOUS

## 2013-05-14 ENCOUNTER — Ambulatory Visit (HOSPITAL_COMMUNITY): Payer: BC Managed Care – PPO

## 2013-06-13 ENCOUNTER — Other Ambulatory Visit (HOSPITAL_COMMUNITY): Payer: Self-pay | Admitting: Internal Medicine

## 2013-06-13 DIAGNOSIS — B182 Chronic viral hepatitis C: Secondary | ICD-10-CM

## 2013-06-15 ENCOUNTER — Ambulatory Visit (HOSPITAL_COMMUNITY)
Admission: RE | Admit: 2013-06-15 | Discharge: 2013-06-15 | Disposition: A | Discharge status: Home / Self Care | Payer: BC Managed Care – PPO | Source: Ambulatory Visit | Attending: Internal Medicine | Admitting: Internal Medicine

## 2013-06-15 DIAGNOSIS — K7689 Other specified diseases of liver: Secondary | ICD-10-CM | POA: Insufficient documentation

## 2013-06-15 DIAGNOSIS — B182 Chronic viral hepatitis C: Secondary | ICD-10-CM

## 2014-05-20 ENCOUNTER — Encounter: Payer: Self-pay | Admitting: Podiatry

## 2014-05-20 ENCOUNTER — Ambulatory Visit (INDEPENDENT_AMBULATORY_CARE_PROVIDER_SITE_OTHER): Payer: BC Managed Care – PPO

## 2014-05-20 ENCOUNTER — Ambulatory Visit (INDEPENDENT_AMBULATORY_CARE_PROVIDER_SITE_OTHER): Payer: BC Managed Care – PPO | Admitting: Podiatry

## 2014-05-20 DIAGNOSIS — T847XXA Infection and inflammatory reaction due to other internal orthopedic prosthetic devices, implants and grafts, initial encounter: Secondary | ICD-10-CM

## 2014-05-20 MED ORDER — CEPHALEXIN 500 MG PO CAPS: 500.0000 mg | ORAL_CAPSULE | Freq: Two times a day (BID) | ORAL | Status: DC

## 2014-05-21 NOTE — Progress Notes (Signed)
Subjective:     Patient ID: Theresa Rocha, female   DOB: 1966/11/24, 48 y.o.   MRN: [id number]  HPI patient presents stating the third toe on my left foot has been red and I was just concerned because the screw was put in at last year   Review of Systems     Objective:   Physical Exam Neurovascular status is intact with no change in health history and patient well oriented x3. Around the distal phalanx I did note a localized redness with no drainage or proximal edema erythema or lymph no distention noted. The screw itself does not appear to be working its way out    Assessment:     Possible localized infection versus possible inflammatory reaction third toe left    Plan:     X-ray reviewed with patient and placed patient on cephalexin 500 mg twice a day for 10 days. Gave instructions of redness does not go away gets worse or any drainage or proximal inflammation should occur she is to reappoint immediately for [place] to reevaluate

## 2014-12-17 ENCOUNTER — Ambulatory Visit (INDEPENDENT_AMBULATORY_CARE_PROVIDER_SITE_OTHER): Payer: BLUE CROSS/BLUE SHIELD | Admitting: Podiatrist

## 2014-12-17 ENCOUNTER — Ambulatory Visit (INDEPENDENT_AMBULATORY_CARE_PROVIDER_SITE_OTHER): Payer: BLUE CROSS/BLUE SHIELD

## 2014-12-17 ENCOUNTER — Encounter: Payer: Self-pay | Admitting: Podiatrist

## 2014-12-17 VITALS — BP 110/67 | HR 81 | Resp 16

## 2014-12-17 DIAGNOSIS — Z472 Encounter for removal of internal fixation device: Secondary | ICD-10-CM

## 2014-12-17 NOTE — Patient Instructions (Signed)
Pre-Operative Instructions  Congratulations, you have decided to take an important step to improving your quality of life.  You can be assured that the doctors of [facility] will be with you every step of the way.  1. Plan to be at the surgery center/hospital at least 1 (one) hour prior to your scheduled time unless otherwise directed by the surgical center/hospital staff.  You must have a responsible adult accompany you, remain during the surgery and drive you home.  Make sure you have directions to the surgical center/hospital and know how to get there on time. 2. For hospital based surgery you will need to obtain a history and physical form from your family physician within 1 month prior to the date of surgery- we will give you a form for you primary physician.  3. We make every effort to accommodate the date you request for surgery.  There are however, times where surgery dates or times have to be moved.  We will contact you as soon as possible if a change in schedule is required.   4. No Aspirin/Ibuprofen for one week before surgery.  If you are on aspirin, any non-steroidal anti-inflammatory medications (Mobic, Aleve, Ibuprofen) you should stop taking it 7 days prior to your surgery.  You make take Tylenol  For pain prior to surgery.  5. Medications- If you are taking daily heart and blood pressure medications, seizure, reflux, allergy, asthma, anxiety, pain or diabetes medications, make sure the surgery center/hospital is aware before the day of surgery so they may notify you which medications to take or avoid the day of surgery. 6. No food or drink after midnight the night before surgery unless directed otherwise by surgical center/hospital staff. 7. No alcoholic beverages 24 hours prior to surgery.  No smoking 24 hours prior to or 24 hours after surgery. 8. Wear loose pants or shorts- loose enough to fit over bandages, boots, and casts. 9. No slip on shoes, sneakers are best. 10. Bring  your boot with you to the surgery center/hospital.  Also bring crutches or a walker if your physician has prescribed it for you.  If you do not have this equipment, it will be provided for you after surgery. 11. If you have not been contracted by the surgery center/hospital by the day before your surgery, call to confirm the date and time of your surgery. 12. Leave-time from work may vary depending on the type of surgery you have.  Appropriate arrangements should be made prior to surgery with your employer. 13. Prescriptions will be provided immediately following surgery by your doctor.  Have these filled as soon as possible after surgery and take the medication as directed. 14. Remove nail polish on the operative foot. 15. Wash the night before surgery.  The night before surgery wash the foot and leg well with the antibacterial soap provided and water paying special attention to beneath the toenails and in between the toes.  Rinse thoroughly with water and dry well with a towel.  Perform this wash unless told not to do so by your physician.  Enclosed: 1 Ice pack (please put in freezer the night before surgery)   1 Hibiclens skin cleaner   Pre-op Instructions  If you have any questions regarding the instructions, do not hesitate to call our office.  [place]: [address] [place], [place] [address] [phone]  [place]: [address] , [place] [address] [phone]  [place]: [address]  [place],  [address] [phone]  Dr. Richard   Tuchman DPM, Dr. Norman Regal DPM Dr. Richard Sikora DPM, Dr. M. Todd Hyatt DPM, Dr. Deara Bober DPM 

## 2014-12-17 NOTE — Progress Notes (Signed)
Chief Complaint  Patient presents with  . Toe Pain    Follow up 3rd toe left   "This toe has been like this since last year. Its just not better."     HPI: Patient is 49 y.o. female who presents today for pain in her left third toe. She states that the toe has been painful since last year and that it has not gotten any better. She previously had hammertoe surgery with fusion with digital screws digits 2, 3, 4 on the left foot. She relates that the screws on digits 2 and 4 are not painful.   Allergies  Allergen Reactions  . Aspirin     REACTION: nausea  . Hydromorphone Hcl     Physical Exam  Patient is awake, alert, and oriented x 3.  In no acute distress.  Vascular status is intact with palpable pedal pulses at 2/4 DP and PT bilateral and capillary refill time within normal limits. Neurological sensation is also intact bilaterally via Semmes Weinstein monofilament at 5/5 sites.   The left third toe itself is swollen in comparison with digits 2 and 4. There is some localized redness to the toe. No open lesion is seen, no drainage is noted.  X-ray shows Lucency at the distal half of the screw consistent with loosening in this area of the screw is seen on xray. No sign of Lucency seen around screws on toes 2, and 4.  Assessment:  Loosening of screw 3rd toe left foot/ inflammation of toe.   Plan:  Discussed treatment options and alternatives. Considering she's been having pain in this toe for the last year I recommended removing the screw on the third toe. Also discussed that we can remove the screws on toes 2 and 4 as well as there is complete fusion noted on x-ray. The patient would like to proceed and have this performed.  The consent form was discussed and all three pages were signed and the patient's questions were encouraged and answered to the best of my ability. Risks of the surgery were discussed including but not limited to continued pain, infection, swelling, elevated toe, decreased  range of motion,  suture or implant reaction, bleeding, decreased function, etc. Preoperative instructions were also dispensed to the patient as well as a preoperative surgical pamphlet to go along with the instructions. Surgery will be scheduled at the patients convenience and patient will be seen at [facility] surgery center on outpatient basis.The patient is instructed to call if any questions or concerns arise.

## 2014-12-30 ENCOUNTER — Encounter: Payer: Self-pay | Admitting: Podiatrist

## 2014-12-30 DIAGNOSIS — Z4889 Encounter for other specified surgical aftercare: Secondary | ICD-10-CM

## 2015-01-03 ENCOUNTER — Encounter: Payer: BLUE CROSS/BLUE SHIELD | Admitting: Podiatrist

## 2015-01-08 ENCOUNTER — Ambulatory Visit (INDEPENDENT_AMBULATORY_CARE_PROVIDER_SITE_OTHER): Payer: BLUE CROSS/BLUE SHIELD

## 2015-01-08 ENCOUNTER — Encounter: Payer: Self-pay | Admitting: Podiatry

## 2015-01-08 ENCOUNTER — Ambulatory Visit (INDEPENDENT_AMBULATORY_CARE_PROVIDER_SITE_OTHER): Payer: BLUE CROSS/BLUE SHIELD | Admitting: Podiatry

## 2015-01-08 VITALS — BP 105/78 | HR 88 | Resp 18

## 2015-01-08 DIAGNOSIS — T847XXA Infection and inflammatory reaction due to other internal orthopedic prosthetic devices, implants and grafts, initial encounter: Secondary | ICD-10-CM

## 2015-01-08 DIAGNOSIS — Z9889 Other specified postprocedural states: Secondary | ICD-10-CM

## 2015-01-08 NOTE — Patient Instructions (Signed)
Monitor for any signs/symptoms of infection. Call the office immediately if any occur or go directly to the emergency room. Call with any questions/concerns.  

## 2015-01-13 NOTE — Progress Notes (Signed)
Dr Valentina Lucks performed a screw removal on 2,3,4 met left foot on 2.1.16

## 2015-01-14 NOTE — Progress Notes (Signed)
Patient ID: Theresa Rocha, female   DOB: 06-24-1966, 49 y.o.   MRN: [id number]  Subjective:  49 year old female persist the office today postop visit #1 status post hardware removal to digits 2-4 on the left foot. Overall, she states that she is doing well. She is taking pain medication once a day and taken and about a cast directed. She denies any systemic complaints as fevers, chills, nausea, vomiting. She denies any chest pain, calf pain, shortness of breath. She's been wearing a surgical shoe. She does state that she went back to work Sunday night and without her feet for a period of time which resulted in some discomfort to the area. No other complaints at this time.  Objective: AAO 3, NAD DP/PT pulses palpable, CRT less than 3 seconds Protective sensation intact with Simms Weinstein monofilament, vibratory sensation intact, Achilles tendon reflex intact. Incisions on the distal aspect of the second-fourth digits are well coapted with sutures intact. There is no surrounding erythema. There is no drainage or purulence expressed. No malodor. There is no clinical signs of infection at this time. There is trace edema to the digits however the edema is improved compared to what it was prior to surgery. There is mild ecchymosis present. There is no tenderness over one surgical site or other areas of the feet. No other open lesions or pre-ulcerative lesions identified. No pain with calf compression, swelling, warmth, erythema.  Assessment: 49 year old female 1 week status post left foot hardware removal  Plan: -X-rays were obtained and reviewed the patient. -Treatment options discussed including alternatives, risks, complications. -The dressing was removed and anabolic ointment was applied over the incisions followed by dry sterile dressing. Keep the dressing clean, dry, intact. -Recommended her to continue with surgical shoe at this time. -Continue ice and elevation -Finish course of  antibiotics -Pain medicine as needed. -Follow-up in one week for suture removal or sooner if any problems are to arise. In the meantime, encouraged to call the office with any questions, concerns, change in symptoms.

## 2015-01-15 ENCOUNTER — Encounter: Payer: Self-pay | Admitting: Podiatry

## 2015-01-15 ENCOUNTER — Encounter: Payer: Self-pay | Admitting: Podiatrist

## 2015-01-15 ENCOUNTER — Ambulatory Visit (INDEPENDENT_AMBULATORY_CARE_PROVIDER_SITE_OTHER): Payer: BLUE CROSS/BLUE SHIELD | Admitting: Podiatry

## 2015-01-15 VITALS — BP 99/65 | HR 87 | Resp 18

## 2015-01-15 DIAGNOSIS — Z9889 Other specified postprocedural states: Secondary | ICD-10-CM

## 2015-01-15 MED ORDER — CEPHALEXIN 500 MG PO CAPS: 500.0000 mg | ORAL_CAPSULE | Freq: Three times a day (TID) | ORAL | Status: DC

## 2015-01-15 MED ORDER — FLUCONAZOLE 150 MG PO TABS: 150.0000 mg | ORAL_TABLET | Freq: Once | ORAL | Status: DC

## 2015-01-15 NOTE — Patient Instructions (Signed)
Monitor for any signs/symptoms of infection. Call the office immediately if any occur or go directly to the emergency room. Call with any questions/concerns.  

## 2015-01-16 NOTE — Progress Notes (Signed)
Patient ID: REID NAWROT, female   DOB: 04/03/1966, 49 y.o.   MRN: [id number]  Subjective: 49 year old female presents the office today postop visit #2 status post hardware removal to digits 2 -4 on the left foot. She states that since last appointment she is continuing the surgical shoe. She states that she is having some discomfort to the second and third digits. She denies any systemic complaints such as fevers, chills, nausea, vomiting. Denies any calf pain, chest pain, shortness of breath. No other complaints at this time in no acute changes since last point.   Objective : AAO 3, NAD  DP/PT pulses palpable, CRT less than 3 seconds  Neurological status unchanged.  Sutures are intact to the distal aspect of the digits 2-4 without any evidence of dehiscence. There is no surrounding erythema, edema, increased warmth. Upon removal of the sutures there was a slight amount of purulence identified which appeared to be localized to the suture. Once the sutures remove the area was palpated and no further purulence was expressed. There is no overlying erythema, edema, increase in warmth. There is no areas of fluctuance or crepitus. No malodor. No ascending cellulitis. There is mild tenderness along the second third digits.  No other areas of tenderness bilateral lower extremity. No pain with calf compression, swelling, warmth, erythema.   Assessment : 49 year old female 2 weeks status post hardware removal of foot   Plan: -Treatment options discussed including alternatives, risks, complications.  -Sutures removed at today's appointment.  -Due to the small amount of purulence in the second digit will start Keflex in case of infection. Also prescribed Diflucan. - Monitoring signs or symptoms of worsening infection and directed to call the office immediately should any occur or go to the emergency room.  -Continue surgical shoe.  -Pain medication as needed. She did not need a refill today. - Follow-up in  2 weeks or sooner if any problems are to arise. In the meantime encouraged to call the office with any questions, concerns, change in symptoms.

## 2015-01-29 ENCOUNTER — Ambulatory Visit (INDEPENDENT_AMBULATORY_CARE_PROVIDER_SITE_OTHER): Payer: BLUE CROSS/BLUE SHIELD | Admitting: Podiatry

## 2015-01-29 ENCOUNTER — Ambulatory Visit (INDEPENDENT_AMBULATORY_CARE_PROVIDER_SITE_OTHER): Payer: BLUE CROSS/BLUE SHIELD

## 2015-01-29 ENCOUNTER — Encounter: Payer: Self-pay | Admitting: Podiatry

## 2015-01-29 VITALS — BP 119/67 | HR 87 | Resp 18

## 2015-01-29 DIAGNOSIS — Z9889 Other specified postprocedural states: Secondary | ICD-10-CM | POA: Diagnosis not present

## 2015-01-29 DIAGNOSIS — S92912A Unspecified fracture of left toe(s), initial encounter for closed fracture: Secondary | ICD-10-CM

## 2015-01-29 NOTE — Patient Instructions (Signed)
Continue to wear surgical shoe

## 2015-02-03 NOTE — Progress Notes (Signed)
Patient ID: ANNALIZA ZIA, female   DOB: 15-Dec-1965, 49 y.o.   MRN: [id number]  Subjective: 49 year old female presents the office today postop visit #3 status post hardware removal to digits 2 -4 on the left foot. She has since transitioned to regular shoes. She does that she has some pain to the toes still and some mild swelling to the fourth toe. Overall she is able to ambulate though in a regular shoe. She denies any systemic complaints such as fevers, chills, nausea, vomiting. Denies any calf pain, chest pain, shortness of breath. No other complaints at this time in no acute changes since last point.   Objective : AAO 3, NAD  DP/PT pulses palpable, CRT less than 3 seconds  Neurological status unchanged.  Incisions along the dorsal aspect of the digits second, third, fourth digits are well coapted without any  evidence of dehiscence. There is trace edema overlying the digits however there is no associated erythema or increase in warmth. There is no ascending cellulitis, fluctuance, crepitance, maolor. There is mild tenderness palpation upon the digits mostly over the fourth digit. No pain with MTPJ range of motion. No clinical signs of infection.  No other areas of tenderness bilateral lower extremity. No pain with calf compression, swelling, warmth, erythema.   Assessment : 49 year old female status post hardware removal of foot   Plan: -X-rays were obtained and reviewed with the patient. See x-ray report. -Treatment options discussed including alternatives, risks, complications.  -Due to x-ray findings of fracture of the fourth digit and pain recommend continue mobilization the surgical shoe. -Continue ice and elevation. -Pain medication as needed. -Follow-up in 2 weeks or sooner should any problems arise. In the meantime encouraged to call the office if any questions, concerns, change in symptoms.

## 2015-02-19 ENCOUNTER — Encounter: Payer: BLUE CROSS/BLUE SHIELD | Admitting: Podiatry

## 2015-03-05 ENCOUNTER — Encounter: Payer: Self-pay | Admitting: Podiatrist

## 2015-03-05 ENCOUNTER — Ambulatory Visit: Payer: BLUE CROSS/BLUE SHIELD

## 2015-03-05 ENCOUNTER — Encounter: Payer: BLUE CROSS/BLUE SHIELD | Admitting: Podiatry

## 2015-03-11 NOTE — Progress Notes (Signed)
No show for appointment.  Will have the office call her.

## 2015-09-25 ENCOUNTER — Encounter: Payer: Self-pay | Admitting: Internal Medicine

## 2016-06-14 DIAGNOSIS — H66001 Acute suppurative otitis media without spontaneous rupture of ear drum, right ear: Secondary | ICD-10-CM | POA: Diagnosis not present

## 2016-06-14 DIAGNOSIS — H9201 Otalgia, right ear: Secondary | ICD-10-CM | POA: Diagnosis not present

## 2016-06-14 DIAGNOSIS — H60331 Swimmer's ear, right ear: Secondary | ICD-10-CM | POA: Diagnosis not present

## 2016-06-21 DIAGNOSIS — Z043 Encounter for examination and observation following other accident: Secondary | ICD-10-CM | POA: Diagnosis not present

## 2016-06-21 DIAGNOSIS — M549 Dorsalgia, unspecified: Secondary | ICD-10-CM | POA: Diagnosis not present

## 2016-06-22 ENCOUNTER — Encounter (INDEPENDENT_AMBULATORY_CARE_PROVIDER_SITE_OTHER): Payer: Self-pay

## 2016-06-22 ENCOUNTER — Ambulatory Visit
Admission: RE | Admit: 2016-06-22 | Discharge: 2016-06-22 | Disposition: A | Discharge status: Home / Self Care | Payer: Self-pay | Source: Ambulatory Visit | Attending: Internal Medicine | Admitting: Internal Medicine

## 2016-06-22 ENCOUNTER — Other Ambulatory Visit: Payer: Self-pay | Admitting: Internal Medicine

## 2016-06-22 DIAGNOSIS — M25511 Pain in right shoulder: Secondary | ICD-10-CM

## 2016-08-05 DIAGNOSIS — S9031XA Contusion of right foot, initial encounter: Secondary | ICD-10-CM | POA: Diagnosis not present

## 2016-08-05 DIAGNOSIS — S99921A Unspecified injury of right foot, initial encounter: Secondary | ICD-10-CM | POA: Diagnosis not present

## 2016-08-05 DIAGNOSIS — M7989 Other specified soft tissue disorders: Secondary | ICD-10-CM | POA: Diagnosis not present

## 2016-08-18 DIAGNOSIS — M25571 Pain in right ankle and joints of right foot: Secondary | ICD-10-CM | POA: Diagnosis not present

## 2016-08-18 DIAGNOSIS — R2241 Localized swelling, mass and lump, right lower limb: Secondary | ICD-10-CM | POA: Diagnosis not present

## 2016-08-18 DIAGNOSIS — M79671 Pain in right foot: Secondary | ICD-10-CM | POA: Diagnosis not present

## 2017-03-10 DIAGNOSIS — L409 Psoriasis, unspecified: Secondary | ICD-10-CM | POA: Diagnosis not present

## 2017-03-10 DIAGNOSIS — I781 Nevus, non-neoplastic: Secondary | ICD-10-CM | POA: Diagnosis not present

## 2017-03-21 DIAGNOSIS — B182 Chronic viral hepatitis C: Secondary | ICD-10-CM | POA: Diagnosis not present

## 2017-03-31 ENCOUNTER — Encounter: Payer: Self-pay | Admitting: Internal Medicine

## 2017-03-31 ENCOUNTER — Ambulatory Visit (INDEPENDENT_AMBULATORY_CARE_PROVIDER_SITE_OTHER): Payer: BLUE CROSS/BLUE SHIELD | Admitting: Internal Medicine

## 2017-03-31 VITALS — BP 135/80 | HR 79 | Temp 98.0°F | Ht 63.0 in | Wt 100.0 lb

## 2017-03-31 DIAGNOSIS — B182 Chronic viral hepatitis C: Secondary | ICD-10-CM

## 2017-03-31 DIAGNOSIS — Z23 Encounter for immunization: Secondary | ICD-10-CM

## 2017-03-31 NOTE — Progress Notes (Signed)
[facility] [facility]   CC: consideration for treatment for chronic hepatitis C  HPI:  +Theresa Rocha is a 51 y.o. female who presents for initial evaluation and management of chronic hepatitis C.  Patient tested positive over 5 years ago. Hepatitis C-associated risk factors present are: none. Patient denies history of blood transfusion, intranasal drug use, IV drug abuse, renal dialysis, sexual contact with person with liver disease, tattoos. Patient has had other studies performed. Results: hepatitis C RNA by PCR, result: positive. Patient has not had prior treatment for Hepatitis C. Patient does not have a past history of liver disease. Patient does not have a family history of liver disease. Patient does not  have associated signs or symptoms related to liver disease.  Labs reviewed and confirm chronic hepatitis C with a positive viral load. Genotype is pending from [place] office.   She did previously have a Fibroscan done in [place] that she remembers being an F1.   Records reviewed from Epic.  She did see Dr. Maceo Pro in the past,  In about 2007.  No biopsy found.      Patient does not have documented immunity to Hepatitis A. Patient does not have documented immunity to Hepatitis B.    Review of Systems:  Constitutional: negative for fatigue and malaise Gastrointestinal: negative for diarrhea Integument/breast: negative for pruritus Musculoskeletal: negative for myalgias and arthralgias All other systems reviewed and are negative       PMH: hepatitis C  Prior to Admission medications   Medication Sig Start Date End Date Taking? Authorizing Provider  loperamide (IMODIUM) 2 MG capsule Take 2 mg by mouth as needed for diarrhea or loose stools.   Yes Historical Provider, MD    Allergies  Allergen Reactions  . Hydromorphone Hcl   . Aspirin Nausea Only    REACTION: nausea    Social History  Substance Use Topics  . Smoking status: Current Every Day Smoker   Packs/day: 0.50    Types: Cigarettes    Start date: 11/29/1978  . Smokeless tobacco: Never Used  . Alcohol use 7.2 oz/week    12 Cans of beer per week    [place]: mother and father with lung cancer   Objective:  Constitutional: in no apparent distress,  Vitals:   03/31/17 1025  BP: 135/80  Pulse: 79  Temp: 98 F (36.7 C)   Eyes: anicteric Cardiovascular: Cor RRR Respiratory: CTA B; normal respiratory effort Gastrointestinal: Bowel sounds are normal, liver is not enlarged, spleen is not enlarged, soft, nt Musculoskeletal: no pedal edema noted Skin: negatives: no rash; no porphyria cutanea tarda Lymphatic: no cervical lymphadenopathy   Laboratory Genotype: No results found for: HCVGENOTYPE HCV viral load: No results found for: HCVQUANT Lab Results  Component Value Date   WBC 8.9 03/22/2007   HGB 15.2 (H) 03/22/2007   HCT 43.3 03/22/2007   MCV 93.1 03/22/2007   PLT 225 03/22/2007    Lab Results  Component Value Date   CREATININE 0.8 03/22/2007   BUN 6 03/22/2007   NA 141 03/22/2007   K 3.9 03/22/2007   CL 106 03/22/2007   CO2 32 03/22/2007    Lab Results  Component Value Date   ALT 142 (H) 03/22/2007   AST 108 (H) 03/22/2007   ALKPHOS 81 03/22/2007     Labs and history reviewed and show CHILD-PUGH unknown  5-6 points: Child class A 7-9 points: Child class B 10-15 points: Child class C  Lab Results  Component Value Date   BILITOT 0.8 03/22/2007   ALBUMIN 3.5 03/22/2007     Assessment: New Patient with Chronic Hepatitis C genotype unknown, untreated. Genotype results not available today.  I discussed with the patient the lab findings that confirm chronic hepatitis C as well as the natural history and progression of disease including about 30% of people who develop cirrhosis of the liver if left untreated and once cirrhosis is established there is a 2-7% risk per year of liver cancer and liver failure.  I discussed the importance of treatment and benefits in  reducing the risk, even if significant liver fibrosis exists.   Plan: 1) Patient counseled extensively on limiting acetaminophen to no more than 2 grams daily, avoidance of alcohol. 2) Transmission discussed with patient including sexual transmission, sharing razors and toothbrush.   3) Will need referral to gastroenterology if concern for cirrhosis 4) Will need referral for substance abuse counseling: No.; Further work up to include urine drug screen  No. 5) Will prescribe appropriate medication based on genotype and coverage  6) Hepatitis A and B titers - in a [employer] employee so has had hepatitis B vaccine series 7) Pneumovax vaccine today 9) Further work up to include liver staging with elastography and Fibrosure 10) will follow up after starting medication

## 2017-03-31 NOTE — Patient Instructions (Signed)
Date 03/31/17  Dear Ms Theresa Rocha, As discussed in the [facility], your hepatitis C therapy will include highly effective medication(s) for treatment and will vary based on the type of hepatitis C and insurance approval.  Potential medications include:          Harvoni (sofosbuvir 90mg /ledipasvir 400mg ) tablet oral daily          OR     Epclusa (sofosbuvir 400mg /velpatasvir 100mg ) tablet oral daily          OR      Mavyret (glecaprevir 100 mg/pibrentasvir 40 mg): Take 3 tablets oral daily          OR     Zepatier (elbasvir 50 mg/grazoprevir 100 mg) oral daily, +/- ribavirin              Medications are typically for 8 or 12 weeks total ---------------------------------------------------------------- Your HCV Treatment Start Date: You will be notified by our office once the medication is approved and where you can pick it up (or if mailed)   ---------------------------------------------------------------- [place]:   [facility] [address], [place] [address] Phone: [phone] Hours: Monday to Friday 7:30 am to 6:00 pm   Please always contact your pharmacy at least 3-4 business days before you run out of medications to ensure your next month's medication is ready or 1 week prior to running out if you receive it by mail.  Remember, each prescription is for 28 days. ---------------------------------------------------------------- GENERAL NOTES REGARDING YOUR HEPATITIS C MEDICATION:  Some medications have the following interactions:  - Acid reducing agents such as H2 blockers (ie. Pepcid (famotidine), Zantac (ranitidine), Tagamet (cimetidine), Axid (nizatidine) and proton pump inhibitors (ie. Prilosec (omeprazole), Protonix (pantoprazole), Nexium (esomeprazole), or Aciphex (rabeprazole)). Do not take until you have discussed with a health care provider.    -Antacids that contain magnesium and/or aluminum hydroxide (ie. Milk of Magensia, Rolaids,  Gaviscon, Maalox, Mylanta, an dArthritis Pain Formula).  -Calcium carbonate (calcium supplements or antacids such as Tums, Caltrate, Os-Cal).  -St. John's wort or any products that contain St. John's wort like some herbal supplements  Please inform the office prior to starting any of these medications.  - The common side effects associated with Harvoni include:      1. Fatigue      2. Headache      3. Nausea      4. Diarrhea      5. Insomnia  Please note that this only lists the most common side effects and is NOT a comprehensive list of the potential side effects of these medications. For more information, please review the drug information sheets that come with your medication package from the pharmacy.  ---------------------------------------------------------------- GENERAL HELPFUL HINTS ON HCV THERAPY: 1. Stay well-hydrated. 2. Notify the [facility] of any changes in your other over-the-counter/herbal or prescription medications. 3. If you miss a dose of your medication, take the missed dose as soon as you remember. Return to your regular time/dose schedule the next day.  4.  Do not stop taking your medications without first talking with your healthcare provider. 5.  You may take Tylenol (acetaminophen), as long as the dose is less than 2000 mg (OR no more than 4 tablets of the Tylenol Extra Strengths 500mg  tablet) in 24 hours. 6.  You will see our pharmacist-specialist within the first 2 weeks of starting your medication to monitor for any possible side effects. 7.  You will have labs once during treatment, soon  after treatment completion and one final lab 6 months after treatment completion to verify the virus is out of your system.  Scharlene Gloss, [place] for [place], [place]  [phone]

## 2017-03-31 NOTE — Addendum Note (Signed)
Addended by: Myrtis Hopping A on: 03/31/2017 11:46 AM   Modules accepted: Orders

## 2017-04-01 LAB — CBC WITH DIFFERENTIAL/PLATELET
BASOS PCT: 2 %
Basophils Absolute: 76 cells/uL (ref 0–200)
EOS PCT: 6 %
Eosinophils Absolute: 228 cells/uL (ref 15–500)
HCT: 40.8 % (ref 35.0–45.0)
Hemoglobin: 13.8 g/dL (ref 11.7–15.5)
Lymphocytes Relative: 36 %
Lymphs Abs: 1368 cells/uL (ref 850–3900)
MCH: 33.1 pg — AB (ref 27.0–33.0)
MCHC: 33.8 g/dL (ref 32.0–36.0)
MCV: 97.8 fL (ref 80.0–100.0)
MONOS PCT: 11 %
MPV: 9.9 fL (ref 7.5–12.5)
Monocytes Absolute: 418 cells/uL (ref 200–950)
NEUTROS ABS: 1710 {cells}/uL (ref 1500–7800)
Neutrophils Relative %: 45 %
PLATELETS: 39 10*3/uL — AB (ref 140–400)
RBC: 4.17 MIL/uL (ref 3.80–5.10)
RDW: 14.4 % (ref 11.0–15.0)
WBC: 3.8 10*3/uL (ref 3.8–10.8)

## 2017-04-01 LAB — PROTIME-INR
INR: 1.2 — AB
PROTHROMBIN TIME: 12.8 s — AB (ref 9.0–11.5)

## 2017-04-01 LAB — HIV ANTIBODY (ROUTINE TESTING W REFLEX): HIV: NONREACTIVE

## 2017-04-01 LAB — HEPATITIS B CORE ANTIBODY, TOTAL: Hep B Core Total Ab: NONREACTIVE

## 2017-04-01 LAB — HEPATITIS A ANTIBODY, TOTAL: Hep A Total Ab: REACTIVE — AB

## 2017-04-01 LAB — HEPATITIS B SURFACE ANTIBODY,QUALITATIVE: HEP B S AB: POSITIVE — AB

## 2017-04-01 LAB — HEPATITIS B SURFACE ANTIGEN: Hepatitis B Surface Ag: NEGATIVE

## 2017-04-04 LAB — HEPATITIS C GENOTYPE

## 2017-04-05 ENCOUNTER — Other Ambulatory Visit: Payer: Self-pay | Admitting: Internal Medicine

## 2017-04-05 LAB — LIVER FIBROSIS, FIBROTEST-ACTITEST
ALPHA-2-MACROGLOBULIN: 335 mg/dL — AB (ref 106–279)
ALT: 99 U/L — AB (ref 6–29)
Apolipoprotein A1: 97 mg/dL — ABNORMAL LOW (ref 101–198)
BILIRUBIN: 0.7 mg/dL (ref 0.2–1.2)
FIBROSIS SCORE: 0.86
GGT: 230 U/L — ABNORMAL HIGH (ref 3–55)
Haptoglobin: 43 mg/dL (ref 43–212)
NECROINFLAMMAT ACT SCORE: 0.75
Reference ID: 1934517

## 2017-04-06 ENCOUNTER — Other Ambulatory Visit: Payer: Self-pay | Admitting: Internal Medicine

## 2017-04-06 ENCOUNTER — Ambulatory Visit (HOSPITAL_COMMUNITY)
Admission: RE | Admit: 2017-04-06 | Discharge: 2017-04-06 | Disposition: A | Discharge status: Home / Self Care | Payer: BLUE CROSS/BLUE SHIELD | Source: Ambulatory Visit | Attending: Internal Medicine | Admitting: Internal Medicine

## 2017-04-06 DIAGNOSIS — B182 Chronic viral hepatitis C: Secondary | ICD-10-CM | POA: Diagnosis not present

## 2017-04-06 MED ORDER — LEDIPASVIR-SOFOSBUVIR 90-400 MG PO TABS: 1.0000 | ORAL_TABLET | Freq: Every day | ORAL | 2 refills | Status: DC

## 2017-04-08 ENCOUNTER — Other Ambulatory Visit: Payer: Self-pay | Admitting: Pharmacist Clinician (PhC)/ Clinical Pharmacy Specialist

## 2017-04-08 MED ORDER — LEDIPASVIR-SOFOSBUVIR 90-400 MG PO TABS: 1.0000 | ORAL_TABLET | Freq: Every day | ORAL | 2 refills | Status: DC

## 2017-04-08 NOTE — Progress Notes (Signed)
Transfer to [place].

## 2017-04-12 ENCOUNTER — Other Ambulatory Visit: Payer: Self-pay | Admitting: Pharmacist Clinician (PhC)/ Clinical Pharmacy Specialist

## 2017-04-12 MED ORDER — RIBAVIRIN 200 MG PO TABS: 600.0000 mg | ORAL_TABLET | Freq: Every day | ORAL | 2 refills | Status: DC

## 2017-04-12 NOTE — Progress Notes (Signed)
After d/w Dr. Linus Salmons, we will add low dose ribavirin to Harvoni. She probably has decompensated at this point with her low platelets.

## 2017-04-13 ENCOUNTER — Telehealth: Payer: Self-pay | Admitting: Pharmacist Clinician (PhC)/ Clinical Pharmacy Specialist

## 2017-04-13 NOTE — Telephone Encounter (Signed)
Called Bernie to let her know that ribavirin will be used in addition to her Harvoni due to her potential decompensated cirrhosis with a plt of 39k. Advised her to call [facility] to set up shipping of both medications.

## 2017-04-19 ENCOUNTER — Telehealth: Payer: Self-pay | Admitting: Pharmacist Clinician (PhC)/ Clinical Pharmacy Specialist

## 2017-04-19 ENCOUNTER — Encounter: Payer: Self-pay | Admitting: Pharmacy Technician

## 2017-04-19 NOTE — Telephone Encounter (Signed)
Theresa Rocha called to ask what she can take to help her sleep since she can't/doesn't dring 3-4 beers at night now. Told her to pick up some benadryl to take as needed. She will prob receive her harvoni/riba this Thursday. Counseled her again on side effects and adherence. She will come back in June for labs.

## 2017-04-26 ENCOUNTER — Telehealth: Payer: Self-pay | Admitting: Pharmacist

## 2017-04-26 NOTE — Telephone Encounter (Signed)
Theresa Rocha called complaining of lower back pain since starting ribavirin and Harvoni on Friday.  I told her that it could be due to the ribavirin - myalgias can happen with this medication.  Told her to take some Motrin or Tylenol up to 2,000 mg/day. She is also using heating pads. She will follow up here in ~2 weeks.

## 2017-05-12 ENCOUNTER — Ambulatory Visit (INDEPENDENT_AMBULATORY_CARE_PROVIDER_SITE_OTHER): Payer: BLUE CROSS/BLUE SHIELD | Admitting: Pharmacist

## 2017-05-12 DIAGNOSIS — B182 Chronic viral hepatitis C: Secondary | ICD-10-CM | POA: Diagnosis not present

## 2017-05-12 LAB — CBC
HEMATOCRIT: 39.2 % (ref 35.0–45.0)
HEMOGLOBIN: 12.8 g/dL (ref 11.7–15.5)
MCH: 33.1 pg — AB (ref 27.0–33.0)
MCHC: 32.7 g/dL (ref 32.0–36.0)
MCV: 101.3 fL — AB (ref 80.0–100.0)
MPV: 10.1 fL (ref 7.5–12.5)
PLATELETS: 39 10*3/uL — AB (ref 140–400)
RBC: 3.87 MIL/uL (ref 3.80–5.10)
RDW: 14.1 % (ref 11.0–15.0)
WBC: 5.9 10*3/uL (ref 3.8–10.8)

## 2017-05-12 NOTE — Progress Notes (Signed)
HPI: Theresa Rocha is a 51 y.o. female who presents to the [facility] clinic for Hep C follow-up.  She has genotype 1a, F4 fibrosis, and started 12 weeks of Harvoni and ribavirin on 5/25.   Lab Results  Component Value Date   HCVGENOTYPE 1a 03/31/2017    Allergies: Allergies  Allergen Reactions  . Hydromorphone Hcl   . Aspirin Nausea Only    REACTION: nausea    Past Medical History: No past medical history on file.  Social History: Social History   Social History  . Marital status: Married    Spouse name: N/A  . Number of children: N/A  . Years of education: N/A   Social History Main Topics  . Smoking status: Current Every Day Smoker    Packs/day: 0.50    Types: Cigarettes    Start date: 11/29/1978  . Smokeless tobacco: Never Used  . Alcohol use 7.2 oz/week    12 Cans of beer per week  . Drug use: No  . Sexual activity: Not Currently    Partners: Male   Other Topics Concern  . Not on file   Social History Narrative  . No narrative on file    Labs: Hep B S Ab (no units)  Date Value  03/31/2017 POS (A)   Hepatitis B Surface Ag (no units)  Date Value  03/31/2017 NEGATIVE    Lab Results  Component Value Date   HCVGENOTYPE 1a 03/31/2017    No flowsheet data found.  AST (units/L)  Date Value  03/22/2007 108 (H)   ALT  Date Value  03/31/2017 99 U/L (H)  03/22/2007 142 units/L (H)   INR (no units)  Date Value  03/31/2017 1.2 (H)    CrCl: CrCl cannot be calculated (Patient's most recent lab result is older than the maximum 21 days allowed.).  Fibrosis Score: F4 as assessed by ultrasound   Child-Pugh Score: B  Previous Treatment Regimen: None  Assessment: Theresa Rocha is here today for Hep C follow-up. She started Havoni ~ 5/25. She has baseline cirrhosis with a platelet count of 39 which potentially could mean decompensation, so we started her on low dose ribavirin as well. She had some intense myalgias when she first started the medications  but now takes a 200 mg ibuprofen at the same time as her Harvoni and ribavirin and it has gone away.  She does complain of some flu-like symptoms such as chills and "coldness". Discussed the potential side effects associated with ribavirin, as well as Harvoni.  She has no headaches or nausea.  She is also experiencing some diarrhea, but suffers from IBS already, so she is not sure if it is the IBS acting up or the medications.  She takes PRN imodium when needed.  She has not missed any doses and takes them around 1:30-2:30 in the afternoon. Explained her fibrosis to her and why she is also on ribavirin.  Will see her monthly and check a viral load and CBC today.    Plans: - Continue Harvoni and ribavirin x 12 weeks - CBC, Hep C viral load today - F/u with me again 7/19 at 2pm  Cassie L. Kuppelweiser, PharmD, [facility] for Infectious Disease 05/12/2017, 4:27 PM

## 2017-05-17 LAB — HEPATITIS C RNA QUANTITATIVE
HCV QUANT: DETECTED [IU]/mL — AB
HCV Quantitative Log: <1.18 Log IU/mL — AB

## 2017-05-19 DIAGNOSIS — L409 Psoriasis, unspecified: Secondary | ICD-10-CM | POA: Diagnosis not present

## 2017-06-16 ENCOUNTER — Ambulatory Visit (INDEPENDENT_AMBULATORY_CARE_PROVIDER_SITE_OTHER): Payer: BLUE CROSS/BLUE SHIELD | Admitting: Pharmacist

## 2017-06-16 DIAGNOSIS — B182 Chronic viral hepatitis C: Secondary | ICD-10-CM | POA: Diagnosis not present

## 2017-06-16 LAB — CBC
HCT: 37.9 % (ref 35.0–45.0)
Hemoglobin: 12.6 g/dL (ref 11.7–15.5)
MCH: 34.1 pg — ABNORMAL HIGH (ref 27.0–33.0)
MCHC: 33.2 g/dL (ref 32.0–36.0)
MCV: 102.4 fL — ABNORMAL HIGH (ref 80.0–100.0)
MPV: 11 fL (ref 7.5–12.5)
PLATELETS: 52 10*3/uL — AB (ref 140–400)
RBC: 3.7 MIL/uL — ABNORMAL LOW (ref 3.80–5.10)
RDW: 13.4 % (ref 11.0–15.0)
WBC: 5.1 10*3/uL (ref 3.8–10.8)

## 2017-06-16 NOTE — Progress Notes (Signed)
HPI: Theresa Rocha is a 51 y.o. female who presents to the [facility] for follow-up of her Hep C infection. She has genotype 1a, F4 fibrosis w/ decompensation, and started 12 weeks of Harvoni + RBV on 5/25.    Lab Results  Component Value Date   HCVGENOTYPE 1a 03/31/2017    Allergies: Allergies  Allergen Reactions  . Hydromorphone Hcl   . Aspirin Nausea Only    REACTION: nausea    Past Medical History: No past medical history on file.  Social History: Social History   Social History  . Marital status: Married    Spouse name: N/A  . Number of children: N/A  . Years of education: N/A   Social History Main Topics  . Smoking status: Current Every Day Smoker    Packs/day: 0.50    Types: Cigarettes    Start date: 11/29/1978  . Smokeless tobacco: Never Used  . Alcohol use 7.2 oz/week    12 Cans of beer per week  . Drug use: No  . Sexual activity: Not Currently    Partners: Male   Other Topics Concern  . Not on file   Social History Narrative  . No narrative on file    Labs: Hep B S Ab (no units)  Date Value  03/31/2017 POS (A)   Hepatitis B Surface Ag (no units)  Date Value  03/31/2017 NEGATIVE    Lab Results  Component Value Date   HCVGENOTYPE 1a 03/31/2017    Hepatitis C RNA quantitative Latest Ref Rng & Units 05/12/2017  HCV Quantitative NOT DETECTED IU/mL <15 DETECTED(A)  HCV Quantitative Log NOT DETECTED Log IU/mL <1.18 DETECTED(A)    AST (units/L)  Date Value  03/22/2007 108 (H)   ALT  Date Value  03/31/2017 99 U/L (H)  03/22/2007 142 units/L (H)   INR (no units)  Date Value  03/31/2017 1.2 (H)    CrCl: CrCl cannot be calculated (Patient's most recent lab result is older than the maximum 21 days allowed.).  Fibrosis Score: F4 as assessed by elastography   Child-Pugh Score: B  Previous Treatment Regimen: None  Assessment: Theresa Rocha is here today to follow-up for her Hep C infection.  We are following her closely since she has  decompensated cirrhosis (platelets 38). She is about to start her 3rd and final month of Harvoni and RBV.  She is still taking a 200 mg tablet of Motrin with the medications every day and is not having anymore back pain or myalgias. She has been having acid reflux and diarrhea x 2 weeks.  She states her diarrhea is pretty normal, and she takes an imodium sometimes and it helps. She states her acid reflux is pretty severe and she has been taking gas-x at night before bed.  I did a drug interaction check and simethicone does not interact with Harvoni or ribavirin.  I told her to continue taking that if it is helping.  She has not missed a single dose and takes 4 pills (1 Harvoni tablet + 3 RBV tablets) around 1-2 pm every day.  I will make her EOT appt with Dr. Linus Rocha and then I told her we would probably see her again for her cure visit. She already has a GI doctor established at [place].   Plans: - Continue Harvoni PO daily + RBV 300 mg PO daily x 3 months total - Hep C RNA and CBC today - F/u with Dr. Linus Rocha at [facility] 9/13 at [facility]  Theresa Rocha, PharmD, [facility] 06/16/2017, 3:41 PM

## 2017-06-20 LAB — HEPATITIS C RNA QUANTITATIVE
HCV QUANT LOG: NOT DETECTED {Log_IU}/mL
HCV QUANT: NOT DETECTED [IU]/mL

## 2017-08-11 ENCOUNTER — Ambulatory Visit (INDEPENDENT_AMBULATORY_CARE_PROVIDER_SITE_OTHER): Payer: BLUE CROSS/BLUE SHIELD | Admitting: Internal Medicine

## 2017-08-11 ENCOUNTER — Encounter: Payer: Self-pay | Admitting: Internal Medicine

## 2017-08-11 VITALS — BP 128/74 | HR 83 | Temp 98.0°F | Ht 63.0 in | Wt 100.0 lb

## 2017-08-11 DIAGNOSIS — K7460 Unspecified cirrhosis of liver: Secondary | ICD-10-CM

## 2017-08-11 DIAGNOSIS — B1920 Unspecified viral hepatitis C without hepatic coma: Secondary | ICD-10-CM | POA: Insufficient documentation

## 2017-08-11 DIAGNOSIS — B182 Chronic viral hepatitis C: Secondary | ICD-10-CM

## 2017-08-11 NOTE — Progress Notes (Signed)
   Subjective:    Patient ID: Theresa Rocha, female    DOB: [birth year], 51 y.o.   MRN: [id number]  HPI Here for follow up of HCV She has genotype 1a and treated with Harvoni + riba for 12 weeks due to decompensated cirrhosis.  She had some issues with maylgias and reflux but ok now completed the medications.  Early viral load < 15.  She was found to have cirrhosis on ultrasound and has had thrombocytopenia that has persisted.  She previously saw Dr. Henrene Pastor for GI for other issues.  She was not aware of cirrhosis.     Review of Systems  Constitutional: Negative for fatigue.  Gastrointestinal: Negative for blood in stool.  Skin: Negative for rash.  Neurological: Negative for dizziness.       Objective:   Physical Exam  Constitutional: She appears well-developed and well-nourished. No distress.  HENT:  Mouth/Throat: No oropharyngeal exudate.  Eyes: No scleral icterus.  Cardiovascular: Normal rate, regular rhythm and normal heart sounds.   No murmur heard. Pulmonary/Chest: Effort normal and breath sounds normal. No respiratory distress.  Lymphadenopathy:    She has no cervical adenopathy.  Skin: No rash noted.    SH: occasional beer      Assessment & Plan:

## 2017-08-11 NOTE — Assessment & Plan Note (Addendum)
Will check EOT lab today.  Will cirrhosis, I will have her return in 6 months for definitive SVR24.   Hepatitis A and B immune, received pneumovax

## 2017-08-11 NOTE — Assessment & Plan Note (Addendum)
This is a new problem since I last saw her.   I discussed with her the need for [facility] screening via ultrasound every 6 months.  I also discussed EGD and referral back to [facility].

## 2017-08-15 LAB — CBC WITH DIFFERENTIAL/PLATELET
BASOS ABS: 62 {cells}/uL (ref 0–200)
Basophils Relative: 1.5 %
EOS PCT: 5.1 %
Eosinophils Absolute: 209 cells/uL (ref 15–500)
HCT: 40.2 % (ref 35.0–45.0)
HEMOGLOBIN: 13.7 g/dL (ref 11.7–15.5)
LYMPHS ABS: 1435 {cells}/uL (ref 850–3900)
MCH: 32.8 pg (ref 27.0–33.0)
MCHC: 34.1 g/dL (ref 32.0–36.0)
MCV: 96.2 fL (ref 80.0–100.0)
MONOS PCT: 12.5 %
NEUTROS ABS: 1882 {cells}/uL (ref 1500–7800)
Neutrophils Relative %: 45.9 %
RBC: 4.18 10*6/uL (ref 3.80–5.10)
RDW: 12.1 % (ref 11.0–15.0)
Total Lymphocyte: 35 %
WBC mixed population: 513 cells/uL (ref 200–950)
WBC: 4.1 10*3/uL (ref 3.8–10.8)

## 2017-08-15 LAB — COMPLETE METABOLIC PANEL WITH GFR
AG Ratio: 0.8 (calc) — ABNORMAL LOW (ref 1.0–2.5)
ALBUMIN MSPROF: 3.2 g/dL — AB (ref 3.6–5.1)
ALT: 29 U/L (ref 6–29)
AST: 62 U/L — AB (ref 10–35)
Alkaline phosphatase (APISO): 135 U/L — ABNORMAL HIGH (ref 33–130)
BUN/Creatinine Ratio: 9 (calc) (ref 6–22)
BUN: 6 mg/dL — AB (ref 7–25)
CALCIUM: 8.4 mg/dL — AB (ref 8.6–10.4)
CO2: 24 mmol/L (ref 20–32)
CREATININE: 0.65 mg/dL (ref 0.50–1.05)
Chloride: 106 mmol/L (ref 98–110)
GFR, EST NON AFRICAN AMERICAN: 104 mL/min/{1.73_m2} (ref >=60)
GFR, Est African American: 120 mL/min/{1.73_m2} (ref >=60)
GLUCOSE: 99 mg/dL (ref 65–99)
Globulin: 3.9 g/dL (calc) — ABNORMAL HIGH (ref 1.9–3.7)
Potassium: 4 mmol/L (ref 3.5–5.3)
Sodium: 137 mmol/L (ref 135–146)
Total Bilirubin: 0.8 mg/dL (ref 0.2–1.2)
Total Protein: 7.1 g/dL (ref 6.1–8.1)

## 2017-08-15 LAB — HEPATITIS C RNA QUANTITATIVE
HCV QUANT LOG: NOT DETECTED {Log_IU}/mL
HCV RNA, PCR, QN: NOT DETECTED [IU]/mL

## 2017-08-18 ENCOUNTER — Encounter: Payer: Self-pay | Admitting: Internal Medicine

## 2017-10-13 ENCOUNTER — Encounter (INDEPENDENT_AMBULATORY_CARE_PROVIDER_SITE_OTHER): Payer: Self-pay

## 2017-10-13 ENCOUNTER — Encounter: Payer: Self-pay | Admitting: Internal Medicine

## 2017-10-13 ENCOUNTER — Ambulatory Visit (INDEPENDENT_AMBULATORY_CARE_PROVIDER_SITE_OTHER): Payer: BLUE CROSS/BLUE SHIELD | Admitting: Internal Medicine

## 2017-10-13 VITALS — Ht 63.0 in | Wt 105.0 lb

## 2017-10-13 DIAGNOSIS — K580 Irritable bowel syndrome with diarrhea: Secondary | ICD-10-CM | POA: Diagnosis not present

## 2017-10-13 DIAGNOSIS — K219 Gastro-esophageal reflux disease without esophagitis: Secondary | ICD-10-CM | POA: Diagnosis not present

## 2017-10-13 DIAGNOSIS — K7460 Unspecified cirrhosis of liver: Secondary | ICD-10-CM | POA: Diagnosis not present

## 2017-10-13 NOTE — Patient Instructions (Signed)

## 2017-10-13 NOTE — Progress Notes (Signed)
HISTORY OF PRESENT ILLNESS:  Theresa Rocha is a 51 y.o. 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 She is sent today by her infectious disease specialist Dr. Scharlene Gloss with a chief complaint of hepatic cirrhosis needing screening endoscopy and long-term follow-up. I have not seen the patient in this office since 2011. She did undergo colonoscopy with biopsies September 2011. The colon and ileum as well as colonic biopsies were normal. Routine follow-up in 10 years recommended. The patient had her hepatitis C genotype 1A treated with Harvoni and ribavirin for 12 weeks. Early viral load less than 15. Last seen by ID 08/11/2017 (office note reviewed). Abdominal ultrasound with elastography was performed 04/06/2017. The liver had changes consistent with cirrhosis. Metavir score is Some F3 + F4, equating to high-risk of fibrosis. Most recent laboratories from September 2018 show normal liver tests except for AST of 62. Albumin is 0.8. Normal sodium at 137. CBC with hemoglobin 13.7. Previous platelet counts this year range from 39,000-52,000. Most recent INR 1.2. Current meld score is 8. Patient denies problems with mentation, GI bleeding, swelling or edema.  REVIEW OF SYSTEMS:  All non-GI ROS negative unless otherwise stated in history of present illness except for stress  Past Medical History:  Diagnosis Date  . Hepatitis C   . IBS (irritable bowel syndrome)     History reviewed. No pertinent surgical history.  Social History NYJAI Rocha  reports that she has been smoking cigarettes.  She started smoking about 38 years ago. She has been smoking about 0.50 packs per day. she has never used smokeless tobacco. She reports that she drinks about 7.2 oz of alcohol per week. She reports that she does not use drugs.  family history includes Liver cancer in her maternal grandmother and  mother; Lung cancer in her father and mother.  Allergies  Allergen Reactions  . Hydromorphone Hcl   . Aspirin Nausea Only    REACTION: nausea       PHYSICAL EXAMINATION: Vital signs: Ht 5\' 3"  (1.6 m)   Wt 105 lb (47.6 kg)   BMI 18.60 kg/m   Constitutional: Thin but generally well-appearing, no acute distress Psychiatric: alert and oriented x3, cooperative Eyes: extraocular movements intact, anicteric, conjunctiva pink Mouth: oral pharynx moist, no lesions Neck: supple without thyromegaly Lymph: no lymphadenopathy Cardiovascular: heart regular rate and rhythm, no murmur Lungs: clear to auscultation bilaterally Abdomen: soft, nontender, nondistended, no obvious ascites, no peritoneal signs, normal bowel sounds, no organomegaly Rectal: Omitted Extremities: no clubbing, cyanosis, or lower extremity edema bilaterally Skin: no lesions on visible extremities Neuro: No focal deficits. No asterixis.    ASSESSMENT:  #1. 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 MELD score 8 #2. Status post treatment of hepatitis C genotype 1 with Harvoni and ribavirin per Dr. Linus Salmons #3. Evidence of portal hypertension on imaging and associated thrombocytopenia #4. History of diarrhea predominant irritable bowel #5. History of GERD complicated by peptic stricture. Currently without symptoms on no therapy #6. Normal colonoscopy 2011   PLAN:  #1. Discussion on hepatic cirrhosis and prognosticator's as well as potential complications #2. Schedule upper endoscopy to screen for esophageal varices. Also look for evidence of problems related to her reflux disease as she may need PPI.The nature of the procedure, as well as the risks, benefits, and alternatives were carefully and thoroughly reviewed with the patient. Ample time for discussion and questions allowed. The patient  understood, was satisfied, and agreed to proceed. #3. Fiber supplementation for irritable bowel syndrome #4. Will  need follow-up screening colonoscopy 2021 #5. Will need at least annual checkups in this office with periodic imaging to screen for [facility] #6. Ongoing general medical care with Dr. Delfina Redwood and ID care with Dr. Linus Salmons  A copy of this consultation note has been sent to Dr. Delfina Redwood and Dr. Linus Salmons

## 2017-11-25 ENCOUNTER — Other Ambulatory Visit: Payer: Self-pay

## 2017-11-25 ENCOUNTER — Inpatient Hospital Stay (HOSPITAL_COMMUNITY)
Admission: AD | Admit: 2017-11-25 | Discharge: 2017-11-26 | Disposition: A | Discharge status: Home / Self Care | Payer: BLUE CROSS/BLUE SHIELD | Source: Ambulatory Visit | Attending: Obstetrics & Gynecology | Admitting: Obstetrics & Gynecology

## 2017-11-25 ENCOUNTER — Encounter (HOSPITAL_COMMUNITY): Payer: Self-pay | Admitting: *Deleted

## 2017-11-25 DIAGNOSIS — N3001 Acute cystitis with hematuria: Secondary | ICD-10-CM | POA: Insufficient documentation

## 2017-11-25 DIAGNOSIS — F17210 Nicotine dependence, cigarettes, uncomplicated: Secondary | ICD-10-CM | POA: Diagnosis not present

## 2017-11-25 DIAGNOSIS — R300 Dysuria: Secondary | ICD-10-CM | POA: Diagnosis not present

## 2017-11-25 LAB — WET PREP, GENITAL
CLUE CELLS WET PREP: NONE SEEN
Sperm: NONE SEEN
TRICH WET PREP: NONE SEEN
YEAST WET PREP: NONE SEEN

## 2017-11-25 NOTE — MAU Note (Addendum)
Dysuria and frequency started yesterday and getting worse. Some lower abd pain. Hx IBS and some diarrhea. Took med and that improved but continues to have lower abd pain and dysuria with urgency and frequency. Pt instructed in self swab for wet prep and cultures. Will wait in lobby for results and pt agrees with plan of care

## 2017-11-26 DIAGNOSIS — N3001 Acute cystitis with hematuria: Secondary | ICD-10-CM

## 2017-11-26 LAB — URINALYSIS, ROUTINE W REFLEX MICROSCOPIC
BACTERIA UA: NONE SEEN
BILIRUBIN URINE: NEGATIVE
GLUCOSE, UA: NEGATIVE mg/dL
Ketones, ur: NEGATIVE mg/dL
NITRITE: NEGATIVE
Protein, ur: 100 mg/dL — AB
SPECIFIC GRAVITY, URINE: 1.017 (ref 1.005–1.030)
Squamous Epithelial / LPF: NONE SEEN
pH: 7 (ref 5.0–8.0)

## 2017-11-26 MED ORDER — NITROFURANTOIN MONOHYD MACRO 100 MG PO CAPS: 100.0000 mg | ORAL_CAPSULE | Freq: Two times a day (BID) | ORAL | 0 refills | Status: DC

## 2017-11-26 MED ORDER — PHENAZOPYRIDINE HCL 100 MG PO TABS: 100.0000 mg | ORAL_TABLET | Freq: Three times a day (TID) | ORAL | 0 refills | Status: DC | PRN

## 2017-11-26 NOTE — Discharge Instructions (Signed)
In late 2019, the [facility] will be moving to the [place]. At that time, the MAU (Maternity Admissions Unit), where you are being seen today, will no longer take care of non-pregnant patients. We strongly encourage you to find a doctor's office before that time, so that you can be seen with any GYN concerns, like vaginal discharge, urinary tract infection, etc.. in a timely manner.  In order to make an office visit more convenient, the Center for [place] [facility] will be offering evening hours with same-day appointments, walk-in appointments and scheduled appointments available during this time.  Center for [facility] Hours: Monday - 8am - 7:30 pm with walk-in between 4pm- 7:30 pm Tuesday - 8 am - 5 pm (starting 02/28/18 we will be open late and accepting walk-ins from 4pm - 7:30pm) Wednesday - 8 am - 5 pm (starting 05/31/18 we will be open late and accepting walk-ins from 4pm - 7:30pm) Thursday 8 am - 5 pm (starting 08/31/18 we will be open late and accepting walk-ins from 4pm - 7:30pm) Friday 8 am - 5 pm  For an appointment please call the Center for [place] [facility] at [phone]  For urgent needs, [facility] is also available for management of urgent GYN complaints such as vaginal discharge or urinary tract infections.   Primary care follow up  [facility] (will see you even if you do not have sickle cell): [phone] [facility]: [phone] [facility]: [phone]   Urinary Tract Infection, Adult A urinary tract infection (UTI) is an infection of any part of the urinary tract. The urinary tract includes the:  Kidneys.  Ureters.  Bladder.  Urethra.  These organs make, store, and get rid of pee (urine) in the body. Follow these instructions at home:  Take over-the-counter and prescription medicines only as told by your doctor.  If you  were prescribed an antibiotic medicine, take it as told by your doctor. Do not stop taking the antibiotic even if you start to feel better.  Avoid the following drinks: ? Alcohol. ? Caffeine. ? Tea. ? Carbonated drinks.  Drink enough fluid to keep your pee clear or pale yellow.  Keep all follow-up visits as told by your doctor. This is important.  Make sure to: ? Empty your bladder often and completely. Do not to hold pee for long periods of time. ? Empty your bladder before and after sex. ? Wipe from front to back after a bowel movement if you are female. Use each tissue one time when you wipe. Contact a doctor if:  You have back pain.  You have a fever.  You feel sick to your stomach (nauseous).  You throw up (vomit).  Your symptoms do not get better after 3 days.  Your symptoms go away and then come back. Get help right away if:  You have very bad back pain.  You have very bad lower belly (abdominal) pain.  You are throwing up and cannot keep down any medicines or water. This information is not intended to replace advice given to you by your health care provider. Make sure you discuss any questions you have with your health care provider. Document Released: 05/03/2008 Document Revised: 04/22/2016 Document Reviewed: 10/06/2015 Elsevier Interactive Patient Education  [employer].

## 2017-11-26 NOTE — Progress Notes (Signed)
Marcille Buffy CNM in to discuss test results and plan of care. Written and verbal d/c instructions given and understanding voiced

## 2017-11-26 NOTE — MAU Provider Note (Signed)
History     CSN: [id number]  Arrival date and time: 11/25/17 2252   First Provider Initiated Contact with Patient 11/26/17 0039      Chief Complaint  Patient presents with  . Dysuria   Theresa Rocha is a 51 y.o. Who presents today with urinary complaints.    Dysuria   This is a new problem. The current episode started yesterday. The problem occurs every urination. The problem has been unchanged. The quality of the pain is described as shooting. The pain is severe. There has been no fever. Associated symptoms include frequency and urgency. Pertinent negatives include no chills, nausea or vomiting. She has tried nothing for the symptoms.    Past Medical History:  Diagnosis Date  . Hepatitis C   . IBS (irritable bowel syndrome)     No past surgical history on file.  Family History  Problem Relation Age of Onset  . Liver cancer Mother   . Lung cancer Mother   . Lung cancer Father   . Liver cancer Maternal Grandmother     Social History   Tobacco Use  . Smoking status: Current Every Day Smoker    Packs/day: 0.50    Types: Cigarettes    Start date: 11/29/1978  . Smokeless tobacco: Never Used  Substance Use Topics  . Alcohol use: Yes    Alcohol/week: 7.2 oz    Types: 12 Cans of beer per week  . Drug use: No    Allergies:  Allergies  Allergen Reactions  . Hydromorphone Hcl   . Aspirin Nausea Only    REACTION: nausea    Medications Prior to Admission  Medication Sig Dispense Refill Last Dose  . loperamide (IMODIUM) 2 MG capsule Take 2 mg as needed by mouth for diarrhea or loose stools.       Review of Systems  Constitutional: Negative for chills and fever.  Gastrointestinal: Negative for nausea and vomiting.  Genitourinary: Positive for dysuria, frequency and urgency.   Physical Exam   Blood pressure 135/78, pulse 97, temperature 98.2 F (36.8 C), resp. rate 18, height 5\' 3"  (1.6 m), weight 102 lb (46.3 kg), last menstrual period 04/20/2013.  Physical  Exam  Nursing note and vitals reviewed. Constitutional: She is oriented to person, place, and time. She appears well-developed and well-nourished. No distress.  HENT:  Head: Normocephalic.  Cardiovascular: Normal rate.  Respiratory: Effort normal.  GI: Soft. There is no tenderness. There is no rebound.  Neurological: She is alert and oriented to person, place, and time.  Skin: Skin is warm and dry.  Psychiatric: She has a normal mood and affect.   Results for orders placed or performed during the hospital encounter of 11/25/17 (from the past 24 hour(s))  Urinalysis, Routine w reflex microscopic     Status: Abnormal   Collection Time: 11/25/17 11:12 PM  Result Value Ref Range   Color, Urine AMBER (A) YELLOW   APPearance CLOUDY (A) CLEAR   Specific Gravity, Urine 1.017 1.005 - 1.030   pH 7.0 5.0 - 8.0   Glucose, UA NEGATIVE NEGATIVE mg/dL   Hgb urine dipstick LARGE (A) NEGATIVE   Bilirubin Urine NEGATIVE NEGATIVE   Ketones, ur NEGATIVE NEGATIVE mg/dL   Protein, ur 100 (A) NEGATIVE mg/dL   Nitrite NEGATIVE NEGATIVE   Leukocytes, UA LARGE (A) NEGATIVE   RBC / HPF TOO NUMEROUS TO COUNT 0 - 5 RBC/hpf   WBC, UA TOO NUMEROUS TO COUNT 0 - 5 WBC/hpf   Bacteria, UA NONE  SEEN NONE SEEN   Squamous Epithelial / LPF NONE SEEN NONE SEEN   Mucus PRESENT   Wet prep, genital     Status: Abnormal   Collection Time: 11/25/17 11:12 PM  Result Value Ref Range   Yeast Wet Prep HPF POC NONE SEEN NONE SEEN   Trich, Wet Prep NONE SEEN NONE SEEN   Clue Cells Wet Prep HPF POC NONE SEEN NONE SEEN   WBC, Wet Prep HPF POC FEW (A) NONE SEEN   Sperm NONE SEEN     MAU Course  Procedures  MDM   Assessment and Plan   1. Acute cystitis with hematuria    DC home Comfort measures reviewed  RX: macrobid BID x 5 days, pyridium PRN #10 Return to MAU as needed for emergencies    Follow-up Information    [facility]   Specialty:  Urgent Care Contact  information: [place] 11/26/2017, 12:39 AM

## 2017-11-28 LAB — GC/CHLAMYDIA PROBE AMP (~~LOC~~) NOT AT ARMC
Chlamydia: NEGATIVE
NEISSERIA GONORRHEA: NEGATIVE

## 2017-11-28 LAB — URINE CULTURE

## 2017-11-29 ENCOUNTER — Telehealth: Payer: Self-pay | Admitting: Student

## 2017-11-29 DIAGNOSIS — N3000 Acute cystitis without hematuria: Secondary | ICD-10-CM

## 2017-11-29 MED ORDER — CEPHALEXIN 500 MG PO CAPS: 500.0000 mg | ORAL_CAPSULE | Freq: Four times a day (QID) | ORAL | 0 refills | Status: DC

## 2017-11-29 NOTE — Telephone Encounter (Signed)
Left voicemail.  Antibiotic needs to be switched to Keflex  Jorje Guild, NP

## 2017-11-29 NOTE — Telephone Encounter (Signed)
Patient returned phone call. Verified by name & DOB. Notified of need to change abx. Confirmed allergies & pharmacy.   Jorje Guild, NP

## 2017-12-01 ENCOUNTER — Encounter: Payer: BLUE CROSS/BLUE SHIELD | Admitting: Internal Medicine

## 2017-12-01 ENCOUNTER — Encounter: Payer: Self-pay | Admitting: Internal Medicine

## 2017-12-01 ENCOUNTER — Ambulatory Visit (AMBULATORY_SURGERY_CENTER): Payer: BLUE CROSS/BLUE SHIELD | Admitting: Internal Medicine

## 2017-12-01 ENCOUNTER — Other Ambulatory Visit: Payer: Self-pay

## 2017-12-01 VITALS — BP 121/76 | HR 86 | Temp 96.0°F | Resp 16 | Ht 63.0 in | Wt 105.0 lb

## 2017-12-01 DIAGNOSIS — K7460 Unspecified cirrhosis of liver: Secondary | ICD-10-CM | POA: Diagnosis not present

## 2017-12-01 DIAGNOSIS — K219 Gastro-esophageal reflux disease without esophagitis: Secondary | ICD-10-CM

## 2017-12-01 DIAGNOSIS — K2990 Gastroduodenitis, unspecified, without bleeding: Secondary | ICD-10-CM

## 2017-12-01 DIAGNOSIS — K766 Portal hypertension: Secondary | ICD-10-CM

## 2017-12-01 DIAGNOSIS — K2970 Gastritis, unspecified, without bleeding: Secondary | ICD-10-CM | POA: Diagnosis not present

## 2017-12-01 DIAGNOSIS — K3189 Other diseases of stomach and duodenum: Secondary | ICD-10-CM | POA: Diagnosis not present

## 2017-12-01 DIAGNOSIS — I8500 Esophageal varices without bleeding: Secondary | ICD-10-CM | POA: Diagnosis not present

## 2017-12-01 MED ORDER — SODIUM CHLORIDE 0.9 % IV SOLN: 500.0000 mL | Freq: Once | INTRAVENOUS | Status: DC

## 2017-12-01 MED ORDER — OMEPRAZOLE 20 MG PO CPDR: 20.0000 mg | DELAYED_RELEASE_CAPSULE | Freq: Every day | ORAL | 11 refills | Status: DC

## 2017-12-01 NOTE — Op Note (Signed)
[employer] Patient Name: Theresa Rocha Procedure Date: 12/01/2017 9:53 AM MRN: [id number] Endoscopist: Docia Chuck. Henrene Pastor , MD Age: 51 Referring MD:  Date of Birth: 1966-07-04 Gender: Female Account #: [id number] Procedure:                Upper GI endoscopy, With biopsies Indications:              Cirrhosis rule out esophageal varices Medicines:                Monitored Anesthesia Care Procedure:                Pre-Anesthesia Assessment:                           - Prior to the procedure, a History and Physical                            was performed, and patient medications and                            allergies were reviewed. The patient's tolerance of                            previous anesthesia was also reviewed. The risks                            and benefits of the procedure and the sedation                            options and risks were discussed with the patient.                            All questions were answered, and informed consent                            was obtained. Prior Anticoagulants: The patient has                            taken no previous anticoagulant or antiplatelet                            agents. ASA Grade Assessment: III - A patient with                            severe systemic disease. After reviewing the risks                            and benefits, the patient was deemed in                            satisfactory condition to undergo the procedure.                           After obtaining informed consent, the endoscope was  passed under direct vision. Throughout the                            procedure, the patient's blood pressure, pulse, and                            oxygen saturations were monitored continuously. The                            Model GIF-[id number]) scope was introduced                            through the mouth, and advanced to the second part                            of  duodenum. The upper GI endoscopy was                            accomplished without difficulty. The patient                            tolerated the procedure well. 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                           Mild reflux Esophagitis was found At the mucosal Z                            line.                           Moderate portal hypertensive gastropathy was found                            in the entire examined stomach. There was hematin                            in the antral portion.                           Patchy mildly erythematous mucosa was found in the                            duodenal bulb. Biopsies were taken with a cold                            forceps for Helicobacter pylori testing using                            CLOtest.                           The examined duodenum was Otherwise normal.  The cardia and gastric fundus were normal on                            retroflexion. No gastric varices. Complications:            No immediate complications. Estimated Blood Loss:     Estimated blood loss: none. Impression:               - Trace Esophageal varices.                           - Reflux esophagitis.                           - Portal hypertensive gastropathy, Moderate.                           - Erythematous duodenopathy. Biopsied.                           - Normal examined duodenum. Recommendation:           1. Prescribe omeprazole 20 mg daily; #30; 11 refills                           2. Follow-up CLO test and treat if positive                           3. Recommend multivitamin with iron. Take 1 daily                           4. GI office follow-up one year                           5. Repeat screening EGD in 2 years                           6. Resume general medical care with her PCP and                            other specialists. Docia Chuck. Henrene Pastor,  MD 12/01/2017 10:16:50 AM This report has been signed electronically.

## 2017-12-01 NOTE — Progress Notes (Signed)
To recovery, reporet to RN, VSS

## 2017-12-01 NOTE — Patient Instructions (Signed)
YOU HAD AN ENDOSCOPIC PROCEDURE TODAY AT [facility]:   Refer to the procedure report that was given to you for any specific questions about what was found during the examination.  If the procedure report does not answer your questions, please call your gastroenterologist to clarify.  If you requested that your care partner not be given the details of your procedure findings, then the procedure report has been included in a sealed envelope for you to review at your convenience later.  YOU SHOULD EXPECT: Some feelings of bloating in the abdomen. Passage of more gas than usual.  Walking can help get rid of the air that was put into your GI tract during the procedure and reduce the bloating. If you had a lower endoscopy (such as a colonoscopy or flexible sigmoidoscopy) you may notice spotting of blood in your stool or on the toilet paper. If you underwent a bowel prep for your procedure, you may not have a normal bowel movement for a few days.  Please Note:  You might notice some irritation and congestion in your nose or some drainage.  This is from the oxygen used during your procedure.  There is no need for concern and it should clear up in a day or so.  SYMPTOMS TO REPORT IMMEDIATELY:   Following upper endoscopy (EGD)  Vomiting of blood or coffee ground material  New chest pain or pain under the shoulder blades  Painful or persistently difficult swallowing  New shortness of breath  Fever of 100F or higher  Black, tarry-looking stools  For urgent or emergent issues, a gastroenterologist can be reached at any hour by calling [phone].   DIET:  We do recommend a small meal at first, but then you may proceed to your regular diet.  Drink plenty of fluids but you should avoid alcoholic beverages for 24 hours.  ACTIVITY:  You should plan to take it easy for the rest of today and you should NOT DRIVE or use heavy machinery until tomorrow (because of the sedation medicines used  during the test).    FOLLOW UP: Our staff will call the number listed on your records the next business day following your procedure to check on you and address any questions or concerns that you may have regarding the information given to you following your procedure. If we do not reach you, we will leave a message.  However, if you are feeling well and you are not experiencing any problems, there is no need to return our call.  We will assume that you have returned to your regular daily activities without incident.  If any biopsies were taken you will be contacted by phone or by letter within the next 1-3 weeks.  Please call us at [phone] if you have not heard about the biopsies in 3 weeks.   Await for results Multivitamin with iron once daily Omeprazole 20 mg one  tab daily Repeat EGD in 2 years GI follow up in one year  SIGNATURES/CONFIDENTIALITY: You and/or your care partner have signed paperwork which will be entered into your electronic medical record.  These signatures attest to the fact that that the information above on your After Visit Summary has been reviewed and is understood.  Full responsibility of the confidentiality of this discharge information lies with you and/or your care-partner.

## 2017-12-01 NOTE — Progress Notes (Signed)
Called to room to assist during endoscopic procedure.  Patient ID and intended procedure confirmed with present staff. Received instructions for my participation in the procedure from the performing physician.  

## 2017-12-02 ENCOUNTER — Telehealth: Payer: Self-pay

## 2017-12-02 LAB — HELICOBACTER PYLORI SCREEN-BIOPSY: UREASE: NEGATIVE

## 2017-12-02 NOTE — Telephone Encounter (Signed)
  Follow up Call-  Call back number 12/01/2017  Post procedure Call Back phone  # [phone]  Permission to leave phone message Yes  Some recent data might be hidden     Patient questions:  Do you have a fever, pain , or abdominal swelling? No. Pain Score  0 *  Have you tolerated food without any problems? Yes.    Have you been able to return to your normal activities? Yes.    Do you have any questions about your discharge instructions: Diet   No. Medications  No. Follow up visit  No.  Do you have questions or concerns about your Care? No.  Actions: * If pain score is 4 or above: No action needed, pain <4.

## 2018-03-22 DIAGNOSIS — R05 Cough: Secondary | ICD-10-CM | POA: Diagnosis not present

## 2018-03-22 DIAGNOSIS — R062 Wheezing: Secondary | ICD-10-CM | POA: Diagnosis not present

## 2018-03-22 DIAGNOSIS — J208 Acute bronchitis due to other specified organisms: Secondary | ICD-10-CM | POA: Diagnosis not present

## 2018-06-27 DIAGNOSIS — L821 Other seborrheic keratosis: Secondary | ICD-10-CM | POA: Diagnosis not present

## 2018-06-27 DIAGNOSIS — L409 Psoriasis, unspecified: Secondary | ICD-10-CM | POA: Diagnosis not present

## 2018-06-27 DIAGNOSIS — I781 Nevus, non-neoplastic: Secondary | ICD-10-CM | POA: Diagnosis not present

## 2018-12-17 ENCOUNTER — Other Ambulatory Visit: Payer: Self-pay | Admitting: Internal Medicine

## 2019-01-08 ENCOUNTER — Encounter: Payer: Self-pay | Admitting: Internal Medicine

## 2019-06-21 DIAGNOSIS — T148XXA Other injury of unspecified body region, initial encounter: Secondary | ICD-10-CM | POA: Diagnosis not present

## 2019-06-21 DIAGNOSIS — B1920 Unspecified viral hepatitis C without hepatic coma: Secondary | ICD-10-CM | POA: Diagnosis not present

## 2019-06-21 DIAGNOSIS — K7460 Unspecified cirrhosis of liver: Secondary | ICD-10-CM | POA: Diagnosis not present

## 2019-06-27 ENCOUNTER — Other Ambulatory Visit: Payer: Self-pay | Admitting: Internal Medicine

## 2019-06-27 DIAGNOSIS — K766 Portal hypertension: Secondary | ICD-10-CM | POA: Diagnosis not present

## 2019-06-27 DIAGNOSIS — M25572 Pain in left ankle and joints of left foot: Secondary | ICD-10-CM | POA: Diagnosis not present

## 2019-06-27 DIAGNOSIS — F17210 Nicotine dependence, cigarettes, uncomplicated: Secondary | ICD-10-CM | POA: Diagnosis not present

## 2019-06-27 DIAGNOSIS — B182 Chronic viral hepatitis C: Secondary | ICD-10-CM

## 2019-07-03 ENCOUNTER — Telehealth: Payer: Self-pay | Admitting: Internal Medicine

## 2019-07-03 NOTE — Telephone Encounter (Signed)
Spoke with pt and she is aware of recommendation.

## 2019-07-03 NOTE — Telephone Encounter (Signed)
She has liver disease and is at risk for the usual complications.  We have not seen her in over a year and a half.  With bleeding, sore abdomen, increased weight, she should go to the emergency room for a thorough evaluation

## 2019-07-03 NOTE — Telephone Encounter (Signed)
Pt states she started noticing some blood on the tissue when she had a BM last week. Yesterday she started seeing more BRB coloring the toilet water and the last time she went to the bathroom she reports it was just blood. She has a small diarrhea stool this am and did not see any blood. She also states she is retaining fluid, reports her abd is sore and she weighed 106 at Dr. Lina Sar office and ususally runs around 100lbs. Pt has OV scheduled with PA 8/27, please advise.

## 2019-07-04 ENCOUNTER — Emergency Department (HOSPITAL_COMMUNITY): Payer: BC Managed Care – PPO

## 2019-07-04 ENCOUNTER — Other Ambulatory Visit: Payer: Self-pay

## 2019-07-04 ENCOUNTER — Encounter (HOSPITAL_COMMUNITY): Payer: Self-pay | Admitting: Obstetrics and Gynecology

## 2019-07-04 ENCOUNTER — Emergency Department (HOSPITAL_COMMUNITY)
Admission: EM | Admit: 2019-07-04 | Discharge: 2019-07-04 | Disposition: A | Discharge status: Home / Self Care | Payer: BC Managed Care – PPO | Attending: Emergency Medicine | Admitting: Emergency Medicine

## 2019-07-04 DIAGNOSIS — F17210 Nicotine dependence, cigarettes, uncomplicated: Secondary | ICD-10-CM | POA: Diagnosis not present

## 2019-07-04 DIAGNOSIS — Z79899 Other long term (current) drug therapy: Secondary | ICD-10-CM | POA: Insufficient documentation

## 2019-07-04 DIAGNOSIS — K7460 Unspecified cirrhosis of liver: Secondary | ICD-10-CM | POA: Insufficient documentation

## 2019-07-04 DIAGNOSIS — R160 Hepatomegaly, not elsewhere classified: Secondary | ICD-10-CM | POA: Diagnosis not present

## 2019-07-04 DIAGNOSIS — K625 Hemorrhage of anus and rectum: Secondary | ICD-10-CM | POA: Diagnosis not present

## 2019-07-04 DIAGNOSIS — R188 Other ascites: Secondary | ICD-10-CM | POA: Insufficient documentation

## 2019-07-04 LAB — COMPREHENSIVE METABOLIC PANEL WITH GFR
ALT: 32 U/L (ref 0–44)
AST: 77 U/L — ABNORMAL HIGH (ref 15–41)
Albumin: 3 g/dL — ABNORMAL LOW (ref 3.5–5.0)
Alkaline Phosphatase: 148 U/L — ABNORMAL HIGH (ref 38–126)
Anion gap: 6 (ref 5–15)
BUN: 5 mg/dL — ABNORMAL LOW (ref 6–20)
CO2: 26 mmol/L (ref 22–32)
Calcium: 8.1 mg/dL — ABNORMAL LOW (ref 8.9–10.3)
Chloride: 107 mmol/L (ref 98–111)
Creatinine, Ser: 0.63 mg/dL (ref 0.44–1.00)
GFR calc Af Amer: >60 mL/min
GFR calc non Af Amer: >60 mL/min
Glucose, Bld: 107 mg/dL — ABNORMAL HIGH (ref 70–99)
Potassium: 3.5 mmol/L (ref 3.5–5.1)
Sodium: 139 mmol/L (ref 135–145)
Total Bilirubin: 2 mg/dL — ABNORMAL HIGH (ref 0.3–1.2)
Total Protein: 7.6 g/dL (ref 6.5–8.1)

## 2019-07-04 LAB — URINALYSIS, ROUTINE W REFLEX MICROSCOPIC
Bilirubin Urine: NEGATIVE
Glucose, UA: NEGATIVE mg/dL
Ketones, ur: NEGATIVE mg/dL
Leukocytes,Ua: NEGATIVE
Nitrite: NEGATIVE
Protein, ur: NEGATIVE mg/dL
Specific Gravity, Urine: 1.005 (ref 1.005–1.030)
pH: 7 (ref 5.0–8.0)

## 2019-07-04 LAB — CBC
HCT: 41.8 % (ref 36.0–46.0)
Hemoglobin: 13.9 g/dL (ref 12.0–15.0)
MCH: 33.7 pg (ref 26.0–34.0)
MCHC: 33.3 g/dL (ref 30.0–36.0)
MCV: 101.5 fL — ABNORMAL HIGH (ref 80.0–100.0)
Platelets: 46 10*3/uL — ABNORMAL LOW (ref 150–400)
RBC: 4.12 MIL/uL (ref 3.87–5.11)
RDW: 15.9 % — ABNORMAL HIGH (ref 11.5–15.5)
WBC: 4.8 10*3/uL (ref 4.0–10.5)
nRBC: 0 % (ref 0.0–0.2)

## 2019-07-04 LAB — TYPE AND SCREEN
ABO/RH(D): A POS
Antibody Screen: NEGATIVE

## 2019-07-04 LAB — ABO/RH: ABO/RH(D): A POS

## 2019-07-04 LAB — LIPASE, BLOOD: Lipase: 82 U/L — ABNORMAL HIGH (ref 11–51)

## 2019-07-04 LAB — PREGNANCY, URINE: Preg Test, Ur: NEGATIVE

## 2019-07-04 LAB — I-STAT BETA HCG BLOOD, ED (MC, WL, AP ONLY): I-stat hCG, quantitative: 7.6 m[IU]/mL — ABNORMAL HIGH

## 2019-07-04 LAB — POC OCCULT BLOOD, ED: Fecal Occult Bld: NEGATIVE

## 2019-07-04 MED ORDER — IOHEXOL 300 MG/ML  SOLN
30.0000 mL | Freq: Once | INTRAMUSCULAR | Status: AC
  Administered 2019-07-04: 30 mL via ORAL

## 2019-07-04 MED ORDER — IOHEXOL 300 MG/ML  SOLN
75.0000 mL | Freq: Once | INTRAMUSCULAR | Status: AC | PRN
  Administered 2019-07-04: 75 mL via INTRAVENOUS

## 2019-07-04 MED ORDER — SODIUM CHLORIDE 0.9 % IV SOLN
INTRAVENOUS | Status: DC
  Administered 2019-07-04: 10:00:00 via INTRAVENOUS

## 2019-07-04 MED ORDER — SODIUM CHLORIDE (PF) 0.9 % IJ SOLN
INTRAMUSCULAR | Status: AC
  Filled 2019-07-04: qty 50

## 2019-07-04 NOTE — Discharge Instructions (Addendum)
Follow-up with your gastroenterologist Dr. Henrene Pastor.  Return for any recurrent rectal bleeding 2 bloody bowel movements in a day is an indication to come back.  CT as we discussed did show some increased ascites.  Discussed that with Dr. Henrene Pastor.  Return for any new or worse symptoms.

## 2019-07-04 NOTE — ED Triage Notes (Signed)
Pt reports she had hepatitis C, but took the medication to cure it, but has since had abdominal distention. Pt reports she called her GI doctor and they had planned to preform an [place], but she started having copious amounts of bright red bleeding rectally and called her GI and was told to come here. Pt has obvious abdominal distention and is alert and oriented at this time. Pt reports drinking a 6 pack of beer a week and smoking 1/2 pack of cigarettes a day

## 2019-07-04 NOTE — ED Notes (Signed)
Pt informed of need for urine specimen. Pt states she's unable to provide one at this time.

## 2019-07-04 NOTE — ED Provider Notes (Signed)
[facility] DEPT Provider Note   CSN: [id number] Arrival date & time: 07/04/19  0759    History   Chief Complaint Chief Complaint  Patient presents with   Rectal Bleeding    HPI Theresa Rocha is a 53 y.o. female.     Patient on Monday had GI bleeding with large amount of red blood from the rectum.  Only a little bit of scant blood yesterday.  None today.  Patient is followed by Dr. Henrene Pastor from [facility].  She has a known history of cirrhosis type unknown.  Patient had an upper endoscopy recently which did show some esophageal varices.  Patient states that his belly has been getting more distended lately.  No real pain.  Patient admits to drinking a sixpack of beer a week.  Patient also known to have internal hemorrhoids.  Chronic hepatitis C.     Past Medical History:  Diagnosis Date   GERD (gastroesophageal reflux disease)    Hepatitis C    IBS (irritable bowel syndrome)     Patient Active Problem List   Diagnosis Date Noted   Cirrhosis ([place]) 08/11/2017   HEMORRHOIDS, INTERNAL 09/09/2010   IBS 09/09/2010   Chronic hepatitis C without hepatic coma ([place]) 06/19/2010   GERD 06/19/2010   DIARRHEA 06/19/2010   ABDOMINAL PAIN -GENERALIZED 06/19/2010    Past Surgical History:  Procedure Laterality Date   COLONOSCOPY     HAMMER TOE SURGERY Left 2012   pins placed in great toe, 2nd,3rd, 4th toes, all pins removed except great toe   PELVIC LAPAROSCOPY Bilateral [facility]  2002   had esophageal dilitation     OB History   No obstetric history on file.      Home Medications    Prior to Admission medications   Medication Sig Start Date End Date Taking? Authorizing Provider  loperamide (IMODIUM) 2 MG capsule Take 2 mg by mouth daily as needed for diarrhea or loose stools.    Yes [provider]  omeprazole (PRILOSEC) 20 MG capsule Take 1 capsule (20 mg total) by mouth daily.  Office visit for further refills 12/18/18  Yes Irene Shipper, MD  cephALEXin (KEFLEX) 500 MG capsule Take 1 capsule (500 mg total) by mouth 4 (four) times daily. Patient not taking: Reported on 07/04/2019 11/29/17   Jorje Guild, NP  phenazopyridine (PYRIDIUM) 100 MG tablet Take 1 tablet (100 mg total) by mouth 3 (three) times daily as needed for pain. Patient not taking: Reported on 07/04/2019 11/26/17   Tresea Mall, CNM    Family History Family History  Problem Relation Age of Onset   Liver cancer Mother    Lung cancer Mother    Lung cancer Father    Liver cancer Maternal Grandmother     Social History Social History   Tobacco Use   Smoking status: Current Every Day Smoker    Packs/day: 0.50    Types: Cigarettes    Start date: 11/29/1978   Smokeless tobacco: Never Used  Substance Use Topics   Alcohol use: Yes    Alcohol/week: 6.0 standard drinks    Types: 6 Cans of beer per week    Frequency: Never   Drug use: No     Allergies   Hydromorphone hcl and Aspirin   Review of Systems Review of Systems  Constitutional: Negative for chills and fever.  HENT: Negative for congestion, rhinorrhea and sore throat.   Eyes: Negative for visual disturbance.  Respiratory: Negative for cough and shortness of breath.   Cardiovascular: Negative for chest pain and leg swelling.  Gastrointestinal: Positive for abdominal distention and blood in stool. Negative for abdominal pain, diarrhea, nausea and vomiting.  Genitourinary: Negative for dysuria.  Musculoskeletal: Negative for back pain and neck pain.  Skin: Negative for rash.  Neurological: Negative for dizziness, light-headedness and headaches.  Hematological: Does not bruise/bleed easily.  Psychiatric/Behavioral: Negative for confusion.     Physical Exam Updated Vital Signs BP 127/79    Pulse 88    Temp 98.3 F (36.8 C) (Oral)    Resp 20    Ht 1.6 m (5\' 3" )    Wt 48.1 kg    LMP 04/20/2013    SpO2 98%    BMI 18.78 kg/m     Physical Exam Vitals signs and nursing note reviewed.  Constitutional:      General: She is not in acute distress.    Appearance: She is well-developed.  HENT:     Head: Normocephalic and atraumatic.  Eyes:     Extraocular Movements: Extraocular movements intact.     Conjunctiva/sclera: Conjunctivae normal.     Pupils: Pupils are equal, round, and reactive to light.  Neck:     Musculoskeletal: Normal range of motion and neck supple.  Cardiovascular:     Rate and Rhythm: Normal rate and regular rhythm.     Heart sounds: No murmur.  Pulmonary:     Effort: Pulmonary effort is normal. No respiratory distress.     Breath sounds: Normal breath sounds.  Abdominal:     General: There is distension.     Palpations: Abdomen is soft.     Tenderness: There is no abdominal tenderness.  Genitourinary:    Rectum: Normal. Guaiac result negative.     Comments: Stool light brown in color. Skin:    General: Skin is warm and dry.  Neurological:     General: No focal deficit present.     Mental Status: She is alert and oriented to person, place, and time.      ED Treatments / Results  Labs (all labs ordered are listed, but only abnormal results are displayed) Labs Reviewed  COMPREHENSIVE METABOLIC PANEL - Abnormal; Notable for the following components:      Result Value   Glucose, Bld 107 (*)    BUN 5 (*)    Calcium 8.1 (*)    Albumin 3.0 (*)    AST 77 (*)    Alkaline Phosphatase 148 (*)    Total Bilirubin 2.0 (*)    All other components within normal limits  CBC - Abnormal; Notable for the following components:   MCV 101.5 (*)    RDW 15.9 (*)    Platelets 46 (*)    All other components within normal limits  LIPASE, BLOOD - Abnormal; Notable for the following components:   Lipase 82 (*)    All other components within normal limits  URINALYSIS, ROUTINE W REFLEX MICROSCOPIC - Abnormal; Notable for the following components:   Hgb urine dipstick SMALL (*)    Bacteria, UA RARE  (*)    All other components within normal limits  I-STAT BETA HCG BLOOD, ED (MC, WL, AP ONLY) - Abnormal; Notable for the following components:   I-stat hCG, quantitative 7.6 (*)    All other components within normal limits  PREGNANCY, URINE  POC OCCULT BLOOD, ED  POC OCCULT BLOOD, ED  TYPE AND SCREEN  ABO/RH    EKG None  Radiology Ct Abdomen Pelvis W Contrast  Result Date: 07/04/2019 CLINICAL DATA:  Abdominal distension and pain, hepatitis C bright red blood rectally EXAM: CT ABDOMEN AND PELVIS WITH CONTRAST TECHNIQUE: Multidetector CT imaging of the abdomen and pelvis was performed using the standard protocol following bolus administration of intravenous contrast. CONTRAST:  17mL OMNIPAQUE IOHEXOL 300 MG/ML  SOLN COMPARISON:  Ultrasounds Apr 06, 2017. FINDINGS: Lower chest: The visualized heart size within normal limits. No pericardial fluid/thickening. There are numerous tortuous varicosity seen surrounding the distal esophagus at the GE junction. The visualized portions of the lungs are clear. Hepatobiliary: There is diffuse low density seen throughout the liver parenchyma with a nodular contour and hepatomegaly.The main portal vein is patent. No evidence of calcified gallstones, gallbladder wall thickening or biliary dilatation. Pancreas: Unremarkable. No pancreatic ductal dilatation or surrounding inflammatory changes. Spleen: Normal in size without focal abnormality. Tortuous splenic varicosities are seen at the hilum. Adrenals/Urinary Tract: Both adrenal glands appear normal. The kidneys and collecting system appear normal without evidence of urinary tract calculus or hydronephrosis. Bladder is unremarkable. Stomach/Bowel: Numerous varicosities are seen surrounding the lesser curvature of the stomach. The remainder of the small bowel is unremarkable. Air and contrast seen in nondilated loops of colon. No focal area of narrowing for bowel wall thickening.There is contrast seen filling the  appendix which measures 1.4 cm in dimensions and has apparent wall thickening. This is best seen on series 2, image 48. There is surrounding fat stranding changes seen in the right lower quadrant which is somewhat obscured due to ascites. Vascular/Lymphatic: There are no enlarged mesenteric, retroperitoneal, or pelvic lymph nodes. A recanalized anterior umbilical vein is present. Mild amount of atherosclerotic calcifications are seen. Reproductive: The uterus and adnexa are unremarkable. Other: AS moderate to large amount of abdominopelvic ascites is seen. 012 Musculoskeletal: No acute or significant osseous findings. IMPRESSION: 1. Findings of cirrhosis and hepatomegaly.  Patent portal vein. 2. Numerous varicosities as described above. 3. Moderate to large amount of abdominopelvic ascites 4. Contrast filled appendix which appears to be a mildly dilated with apparent wall thickening which could be due to surrounding ascites. Electronically Signed   By: Prudencio Pair M.D.   On: 07/04/2019 12:28    Procedures Procedures (including critical care time)  Medications Ordered in ED Medications  0.9 %  sodium chloride infusion ( Intravenous Stopped 07/04/19 1420)  sodium chloride (PF) 0.9 % injection (has no administration in time range)  iohexol (OMNIPAQUE) 300 MG/ML solution 30 mL (30 mLs Oral Contrast Given 07/04/19 1004)  iohexol (OMNIPAQUE) 300 MG/ML solution 75 mL (75 mLs Intravenous Contrast Given 07/04/19 1151)     Initial Impression / Assessment and Plan / ED Course  I have reviewed the triage vital signs and the nursing notes.  Pertinent labs & imaging results that were available during my care of the patient were reviewed by me and considered in my medical decision making (see chart for details).        No evidence of any significant rectal bleeding today stool Hemoccult negative light brown in color.  Hemoglobin stable.  Not tachycardic not hypotensive.  Abdomen with some distention probably  related to the ascites seen on the CT scan.  In addition there is some evidence of varices on the CT scan.  Which could result in bleeding.  Which is now moderate to large.  Patient with known history of cirrhosis type unknown.  There is a history of some alcohol use and there is a history  of hepatitis C.  Patient followed by Dr. Henrene Pastor from [place].  Discussed with their PA they will contact the patient for follow-up in the office.  And will probably phone in medication to help with the ascites.  Did not recommend anything at this time.  Patient stable for discharge home.  Final Clinical Impressions(s) / ED Diagnoses   Final diagnoses:  Rectal bleeding  Cirrhosis of liver with ascites, unspecified hepatic cirrhosis type [facility])    ED Discharge Orders    None       Fredia Sorrow, MD 07/04/19 1432

## 2019-07-04 NOTE — ED Notes (Signed)
ED Provider at bedside. 

## 2019-07-05 ENCOUNTER — Other Ambulatory Visit: Payer: BLUE CROSS/BLUE SHIELD

## 2019-07-05 ENCOUNTER — Telehealth: Payer: Self-pay

## 2019-07-05 ENCOUNTER — Other Ambulatory Visit: Payer: Self-pay

## 2019-07-05 DIAGNOSIS — K7460 Unspecified cirrhosis of liver: Secondary | ICD-10-CM

## 2019-07-05 NOTE — Telephone Encounter (Signed)
Pt scheduled [facility]. Blanch Media orders below on 07/10/19@9am , pt to arrive there at 8:45am. Pt aware of appts and Dr. Blanch Media recommendations.

## 2019-07-05 NOTE — Telephone Encounter (Signed)
Pt scheduled to see Dr. Henrene Pastor 07/12/19@11 :30am. Pt needs to be scheduled for IR para, orders are in epic. Left message for pt to call back, need to find out what day will work for pt to have para.

## 2019-07-05 NOTE — Telephone Encounter (Signed)
-----   Message from Irene Shipper, MD sent at 07/04/2019  9:36 PM EDT ----- Regarding: needs OV and paracentesis Vaughan Basta, Patient of mine in ER for cirrhosis, ascites and transient rectal bleeding. She used to work at [employer]. Anyway, she was sent home but needs follow up care. I reviewed her chart (ER sent me a note tonight) Please contact the patient regarding the following instructions:  1. No alcohol 2. US guided paracentesis. Up to 4 liters with IV albumin replacement (8 gm / L) 3. Send fluid for cell count with differential, protein, albumin, and cytology 4. 2 gm sodium diet 5. OV with me Thursday 8-13- 20 11:30 am (add on)  Thanks, Dr Henrene Pastor

## 2019-07-09 ENCOUNTER — Ambulatory Visit
Admission: RE | Admit: 2019-07-09 | Discharge: 2019-07-09 | Disposition: A | Discharge status: Home / Self Care | Payer: BLUE CROSS/BLUE SHIELD | Source: Ambulatory Visit | Attending: Internal Medicine | Admitting: Internal Medicine

## 2019-07-09 ENCOUNTER — Other Ambulatory Visit: Payer: Self-pay | Admitting: Radiology

## 2019-07-09 DIAGNOSIS — B182 Chronic viral hepatitis C: Secondary | ICD-10-CM | POA: Diagnosis not present

## 2019-07-09 DIAGNOSIS — K7689 Other specified diseases of liver: Secondary | ICD-10-CM | POA: Diagnosis not present

## 2019-07-10 ENCOUNTER — Telehealth: Payer: Self-pay | Admitting: Internal Medicine

## 2019-07-10 ENCOUNTER — Other Ambulatory Visit: Payer: Self-pay | Admitting: Internal Medicine

## 2019-07-10 ENCOUNTER — Other Ambulatory Visit: Payer: Self-pay

## 2019-07-10 ENCOUNTER — Ambulatory Visit (HOSPITAL_COMMUNITY)
Admission: RE | Admit: 2019-07-10 | Discharge: 2019-07-10 | Disposition: A | Discharge status: Home / Self Care | Payer: BC Managed Care – PPO | Source: Ambulatory Visit | Attending: Internal Medicine | Admitting: Internal Medicine

## 2019-07-10 DIAGNOSIS — K7460 Unspecified cirrhosis of liver: Secondary | ICD-10-CM

## 2019-07-10 DIAGNOSIS — K769 Liver disease, unspecified: Secondary | ICD-10-CM

## 2019-07-10 DIAGNOSIS — R188 Other ascites: Secondary | ICD-10-CM | POA: Insufficient documentation

## 2019-07-10 MED ORDER — LIDOCAINE HCL 1 % IJ SOLN
INTRAMUSCULAR | Status: AC
  Filled 2019-07-10: qty 20

## 2019-07-10 NOTE — Telephone Encounter (Signed)
Pt returned your call and would like another call. °

## 2019-07-10 NOTE — Telephone Encounter (Signed)
See result note.  

## 2019-07-10 NOTE — Progress Notes (Signed)
   US guided Limited Abd  No ascites seen in all 4 quadrants  No paracentesis procedure performed  Pt DC to home

## 2019-07-12 ENCOUNTER — Ambulatory Visit: Payer: BC Managed Care – PPO | Admitting: Internal Medicine

## 2019-07-17 ENCOUNTER — Telehealth: Payer: Self-pay | Admitting: Internal Medicine

## 2019-07-18 ENCOUNTER — Ambulatory Visit (HOSPITAL_COMMUNITY): Payer: BC Managed Care – PPO

## 2019-07-19 ENCOUNTER — Encounter: Payer: Self-pay | Admitting: Gastroenterology

## 2019-07-19 ENCOUNTER — Ambulatory Visit: Payer: BLUE CROSS/BLUE SHIELD | Admitting: Gastroenterology

## 2019-07-26 ENCOUNTER — Ambulatory Visit (HOSPITAL_COMMUNITY)
Admission: RE | Admit: 2019-07-26 | Discharge: 2019-07-26 | Disposition: A | Discharge status: Home / Self Care | Payer: BC Managed Care – PPO | Source: Ambulatory Visit | Attending: Internal Medicine | Admitting: Internal Medicine

## 2019-07-26 ENCOUNTER — Other Ambulatory Visit: Payer: Self-pay

## 2019-07-26 ENCOUNTER — Encounter: Payer: Self-pay | Admitting: Gastroenterology

## 2019-07-26 ENCOUNTER — Other Ambulatory Visit (INDEPENDENT_AMBULATORY_CARE_PROVIDER_SITE_OTHER): Payer: BC Managed Care – PPO

## 2019-07-26 ENCOUNTER — Telehealth: Payer: Self-pay | Admitting: Hematology

## 2019-07-26 ENCOUNTER — Ambulatory Visit (INDEPENDENT_AMBULATORY_CARE_PROVIDER_SITE_OTHER): Payer: BC Managed Care – PPO | Admitting: Gastroenterology

## 2019-07-26 VITALS — BP 110/52 | HR 100 | Temp 98.0°F | Ht 63.0 in | Wt 108.0 lb

## 2019-07-26 DIAGNOSIS — R160 Hepatomegaly, not elsewhere classified: Secondary | ICD-10-CM | POA: Diagnosis not present

## 2019-07-26 DIAGNOSIS — K219 Gastro-esophageal reflux disease without esophagitis: Secondary | ICD-10-CM

## 2019-07-26 DIAGNOSIS — K7460 Unspecified cirrhosis of liver: Secondary | ICD-10-CM

## 2019-07-26 DIAGNOSIS — K769 Liver disease, unspecified: Secondary | ICD-10-CM | POA: Insufficient documentation

## 2019-07-26 DIAGNOSIS — K7011 Alcoholic hepatitis with ascites: Secondary | ICD-10-CM | POA: Insufficient documentation

## 2019-07-26 DIAGNOSIS — B182 Chronic viral hepatitis C: Secondary | ICD-10-CM | POA: Diagnosis not present

## 2019-07-26 LAB — PROTIME-INR
INR: 1.5 ratio — ABNORMAL HIGH (ref 0.8–1.0)
Prothrombin Time: 17.6 s — ABNORMAL HIGH (ref 9.6–13.1)

## 2019-07-26 MED ORDER — SPIRONOLACTONE 50 MG PO TABS: 50.0000 mg | ORAL_TABLET | Freq: Every day | ORAL | 2 refills | Status: DC

## 2019-07-26 MED ORDER — GADOBUTROL 1 MMOL/ML IV SOLN
5.0000 mL | Freq: Once | INTRAVENOUS | Status: AC | PRN
  Administered 2019-07-26: 11:00:00 5 mL via INTRAVENOUS

## 2019-07-26 MED ORDER — PANTOPRAZOLE SODIUM 40 MG PO TBEC: 40.0000 mg | DELAYED_RELEASE_TABLET | Freq: Every day | ORAL | 5 refills | Status: DC

## 2019-07-26 MED ORDER — FUROSEMIDE 20 MG PO TABS: 20.0000 mg | ORAL_TABLET | Freq: Every day | ORAL | 2 refills | Status: DC

## 2019-07-26 NOTE — Progress Notes (Signed)
07/26/2019 Theresa MCAFEE [id number] 09/02/1966   HISTORY OF PRESENT ILLNESS: Bryony is a 53 year old female who is a patient of Dr. Blanch Media.  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  Her hep C was successfully treated 2 years ago.  She has continued to drink alcohol up until 3 or 4 weeks ago when she tells me she quit completely.  She presents here today for complaints of abdominal bloating related to ascites.  Apparently she had paracentesis attempted previously, but they were not able to access the fluid in order to successfully perform the procedure/study.  Dr. Henrene Pastor ordered an MRI of the liver for her, which was performed this morning.  The results showed the following:  IMPRESSION: 1. In the lateral segment left hepatic lobe there are two LI-RADS category LR-5 lesions representing hepatocellular carcinoma. 2. There is also a LI-RADS category LR-3 lesion peripherally in the right hepatic lobe, intermediate probability for hepatocellular carcinoma. 3. Hepatic cirrhosis and diffuse wall thickening of the gallbladder.  Widespread steatosis in the liver. 4. 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 5.  Aortic Atherosclerosis (ICD10-I70.0). 6. Widespread ascites and mesenteric edema.  Labs at the beginning of August showed normal sodium and creatinine.  AST 77, ALT 32, alk phos 148, total bilirubin 2.0, platelets 46.  White blood cell count and hemoglobin normal.  While she is here she complains of her omeprazole causing her gassiness/flatulence.  She has chronic diarrhea that has been thought to be due to IBS and manages that with Imodium.  Past Medical History:  Diagnosis Date  . Cervicalgia   . GERD (gastroesophageal reflux disease)   . Hepatic cirrhosis ([place])   . Hepatitis C   . IBS (irritable bowel syndrome)   . Lactose intolerance   . Portal hypertension ([place])   . Raynaud's syndrome   . Thrombocytopenia ([place])    Past Surgical History:   Procedure Laterality Date  . COLONOSCOPY    . HAMMER TOE SURGERY Left 2012   pins placed in great toe, 2nd,3rd, 4th toes, all pins removed except great toe  . PELVIC LAPAROSCOPY Bilateral 1992  . UPPER GASTROINTESTINAL ENDOSCOPY  2002   had esophageal dilitation    reports that she has been smoking cigarettes. She started smoking about 40 years ago. She has been smoking about 0.50 packs per day. She has never used smokeless tobacco. She reports current alcohol use of about 6.0 standard drinks of alcohol per week. She reports that she does not use drugs. family history includes Liver cancer in her maternal grandmother and mother; Lung cancer in her father and mother. Allergies  Allergen Reactions  . Hydromorphone Hcl Nausea And Vomiting  . Aspirin Nausea Only      Outpatient Encounter Medications as of 07/26/2019  Medication Sig  . loperamide (IMODIUM) 2 MG capsule Take 2 mg by mouth daily as needed for diarrhea or loose stools.   Marland Kitchen omeprazole (PRILOSEC) 20 MG capsule Take 1 capsule (20 mg total) by mouth daily. Office visit for further refills  . cephALEXin (KEFLEX) 500 MG capsule Take 1 capsule (500 mg total) by mouth 4 (four) times daily. (Patient not taking: Reported on 07/04/2019)  . phenazopyridine (PYRIDIUM) 100 MG tablet Take 1 tablet (100 mg total) by mouth 3 (three) times daily as needed for pain. (Patient not taking: Reported on 07/04/2019)   No facility-administered encounter medications on file as of 07/26/2019.      REVIEW OF SYSTEMS  : All  other systems reviewed and negative except where noted in the History of Present Illness.   PHYSICAL EXAM: BP (!) 110/52 (BP Location: Left Arm, Patient Position: Sitting, Cuff Size: Normal)   Pulse 100   Temp 98 F (36.7 C) (Other (Comment))   Ht '5\' 3"'$  (1.6 m)   Wt 108 lb (49 kg)   LMP 04/20/2013   BMI 19.13 kg/m  General: Well developed white female in no acute distress Head: Normocephalic and atraumatic Eyes:  Sclerae  anicteric, conjunctiva pink. Ears: Normal auditory acuity Lungs: Clear throughout to auscultation; no increased WOB. Heart: Regular rate and rhythm; no M/R/G. Abdomen: Soft, somewhat distended, but not tense.  BS present.  Non-tender. Musculoskeletal: Symmetrical with no gross deformities  Skin: No lesions on visible extremities Extremities: No edema  Neurological: Alert oriented x 4, grossly non-focal Psychological:  Alert and cooperative. 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  Hep C was successfully treated 2 years ago.  She has continued to use alcohol up until 3 to 4 weeks ago when she claims that she stopped completely.  She had an MRI earlier today that shows portal venous hypertension with uphill paraesophageal varices as well as widespread ascites and mesenteric edema and liver lesions concerning for hepatocellular carcinoma.  Also has thrombocytopenia with platelets of 46 most recently. *GERD: She thinks that the omeprazole makes her gassy.  Will switch PPI to pantoprazole 40 mg daily to see if she has any different response with this.  -I discussed the MRI findings with the patient.  Will refer to oncology.  We will plan for EGD with Dr. Henrene Pastor for esophageal varices screening.  She does have ascites by imaging, but by her exam I am not sure that she has enough fluid that they will be able to do a paracentesis as this was an issue previously as well.  She does not have tense ascites, although does appear somewhat bloated from this.  I am going to start her on Lasix 20 mg daily and Aldactone 50 mg daily.  We discussed a 2 g sodium diet and she was given literature on this.  She will try to measure her weights daily.  I would like her back in a week to check a BMP in order to monitor her renal function in case we need to further increase her diuretics.  Today I am going to check a PT/INR and AFP level.   CC:   Seward Carol, MD

## 2019-07-26 NOTE — Patient Instructions (Addendum)
You have been scheduled for an endoscopy. Please follow written instructions given to you at your visit today. If you use inhalers (even only as needed), please bring them with you on the day of your procedure. Your physician has requested that you go to www.startemmi.com and enter the access code given to you at your visit today. This web site gives a general overview about your procedure. However, you should still follow specific instructions given to you by our office regarding your preparation for the procedure.  Your provider has requested that you go to the basement level for lab work before leaving today. Press "B" on the elevator. The lab is located at the first door on the left as you exit the elevator.  In one week Return for lab work 08/02/19  We have sent the following medications to your pharmacy for you to pick up at your convenience:  STOP ALCOHOL  STOP OMEPRAZOLE  You have been set up for an oncology referral for liver masses

## 2019-07-26 NOTE — Telephone Encounter (Signed)
Received a new patient from [facility] for cirrhosis of liver/liver masses. I cld and scheduled MS. Theresa Rocha to see [name] on 8/31 at 230pm. Lft the appt date and time on the pt's vm to cb and confirm appt.

## 2019-07-27 NOTE — Progress Notes (Signed)
Case reviewed personally with PA Zehr

## 2019-07-30 ENCOUNTER — Ambulatory Visit: Payer: BC Managed Care – PPO | Admitting: Hematology

## 2019-07-30 LAB — AFP TUMOR MARKER: AFP-Tumor Marker: 5.5 ng/mL

## 2019-07-30 NOTE — Telephone Encounter (Signed)
Called patient to advise of rescheduled appointment with Cira Rue NP and [name] on 9/3 @ 1:45 PM. Patient aware to arrive 15 minutes early.

## 2019-07-31 NOTE — Progress Notes (Addendum)
Theresa Rocha  [phone] [phone] Note   Patient Care Team: Seward Carol, MD as PCP - General (Internal Medicine) Arna Snipe, RN as Oncology Nurse Navigator Irene Shipper, MD as Consulting Physician (Gastroenterology) Truitt Merle, MD as Consulting Physician (Hematology) Alla Feeling, NP as Nurse Practitioner (Nurse Practitioner) 08/02/2019  CHIEF COMPLAINTS/PURPOSE OF CONSULTATION:  Liver masses  Oncology History  Hepatocellular carcinoma ([place])  07/04/2019 Imaging   CT AP IMPRESSION: 1. Findings of cirrhosis and hepatomegaly.  Patent portal vein. 2. Numerous varicosities as described above. 3. Moderate to large amount of abdominopelvic ascites 4. Contrast filled appendix which appears to be a mildly dilated with apparent wall thickening which could be due to surrounding ascites.   07/09/2019 Imaging   ABD US IMPRESSION: 1. Three small hypoechoic lesions in the RIGHT hepatic lobe measuring less than 1 cm but not evident on comparison contrast enhanced CT from 07/04/2019. Recommend MRI of the abdomen without and with contrast to evaluate these hepatic lesions in cirrhotic patient. 2. Increased liver echogenicity and lobular contour consistent cirrhosis. 3. Moderate mild to moderate volume of ascites. 4. Mild gallbladder wall thickening likely related to ascites. 5. No cholelithiasis or cholecystitis evident   07/26/2019 Imaging   MR ABD IMPRESSION: 1. In the lateral segment left hepatic lobe there are two LI-RADS category LR-5 lesions representing hepatocellular carcinoma. 2. There is also a LI-RADS category LR-3 lesion peripherally in the right hepatic lobe, intermediate probability for hepatocellular carcinoma. 3. Hepatic cirrhosis and diffuse wall thickening of the gallbladder. Widespread steatosis in the liver. 4. 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 5.  Aortic Atherosclerosis  (ICD10-I70.0). 6. Widespread ascites and mesenteric edema.   07/26/2019 Tumor Marker   AFP 5.5 (baseline)   08/02/2019 Initial Diagnosis   Hepatocellular carcinoma (HCC)     HISTORY OF PRESENTING ILLNESS:  Theresa Rocha 53 y.o. female with a history of chronic hep C genotype 1a s/p harvoni and ribavirin x12 weeks in 2018 and liver cirrhosis followed by Dr. Henrene Pastor presented to ED on 07/04/2019 reporting rectal bleeding. FOBT was negative. Hgb 13.9 but was found to be thrombocytopenic with PLT count 49K. CT AP on 07/04/19 showed cirrhosis and hepatomegaly, esophageal varices, and moderate to large volume ascites. Outpatient ABD [place] on 07/09/19 showed three small sub-cm hypoechoic lesions in the right hepatic lobe. MR Abdomen on 07/26/19 showed in the lateral segment left hepatic lobe there are two LR-5 lesions representing HCC. There is also a LR-3 lesion peripherally in the right hepatic lobe, indeterminate for HCC. No abdominopelvic adenopathy on CT. AFP in normal range, 5.5. She had an abdominal US in 04/06/2017 that showed small hypoechoic nodules in the right hepatic lobe, largest up to 1 cm, that were felt likely to be small cysts.   PMH is significant for treated hepatitis C, cirrhosis with portal hypertension and esophageal varices, IBS with diarrhea, GERD, and psoriasis. Socially, she lives with her husband. She adopted her niece and nephew who are 45 and 49, respectively. She works as the Medical sales representative at [employer]. She works two 12 hour shifts per week. She is independent of ADLs and drives herself. She denies drug use, has never received blood transfusion. She is current every day smoker, 0.5 PPD x35 years. She drank 3-4 cans beer nightly x20 years. She reports a family history of lung cancer in her mother and father.   Today she presents by herself, her husband was on the  phone for part of today's discussion. She has had fatigue for past couple of months. She has IBS and lactose  intolerance. Also has intermittent nausea which she attributes to her diuretics. She started lasix and aldactone which has helped abdominal bloating. Also has intermittent abdominal pain she feels is related to gas. She has diarrhea at baseline, controlled with imodium. She has a "smoker's cough," otherwise denies chest pain or dyspnea. Denies recurrent GI bleeding.   MEDICAL HISTORY:  Past Medical History:  Diagnosis Date   Cervicalgia    GERD (gastroesophageal reflux disease)    Hepatic cirrhosis (HCC)    Hepatitis C    IBS (irritable bowel syndrome)    Lactose intolerance    Portal hypertension (HCC)    Raynaud's syndrome    Thrombocytopenia (HCC)     SURGICAL HISTORY: Past Surgical History:  Procedure Laterality Date   COLONOSCOPY     HAMMER TOE SURGERY Left 2012   pins placed in great toe, 2nd,3rd, 4th toes, all pins removed except great toe   PELVIC LAPAROSCOPY Bilateral 1992   UPPER GASTROINTESTINAL ENDOSCOPY  2002   had esophageal dilitation    SOCIAL HISTORY: Social History   Socioeconomic History   Marital status: Married    Spouse name: Not on file   Number of children: Not on file   Years of education: Not on file   Highest education level: Not on file  Occupational History   Not on file  Social Needs   Financial resource strain: Not on file   Food insecurity    Worry: Not on file    Inability: Not on file   Transportation needs    Medical: Not on file    Non-medical: Not on file  Tobacco Use   Smoking status: Current Every Day Smoker    Packs/day: 0.50    Types: Cigarettes    Start date: 11/29/1978   Smokeless tobacco: Never Used  Substance and Sexual Activity   Alcohol use: Yes    Alcohol/week: 6.0 standard drinks    Types: 6 Cans of beer per week    Frequency: Never   Drug use: No   Sexual activity: Not Currently    Partners: Male  Lifestyle   Physical activity    Days per week: Not on file    Minutes per session:  Not on file   Stress: Not on file  Relationships   Social connections    Talks on phone: Not on file    Gets together: Not on file    Attends religious service: Not on file    Active member of club or organization: Not on file    Attends meetings of clubs or organizations: Not on file    Relationship status: Not on file   Intimate partner violence    Fear of current or ex partner: Not on file    Emotionally abused: Not on file    Physically abused: Not on file    Forced sexual activity: Not on file  Other Topics Concern   Not on file  Social History Narrative   Not on file    FAMILY HISTORY: Family History  Problem Relation Age of Onset   Liver cancer Mother    Lung cancer Mother    Lung cancer Father    Liver cancer Maternal Grandmother     ALLERGIES:  is allergic to hydromorphone hcl and aspirin.  MEDICATIONS:  Current Outpatient Medications  Medication Sig Dispense Refill   furosemide (LASIX) 20  MG tablet Take 1 tablet (20 mg total) by mouth daily. 30 tablet 2   loperamide (IMODIUM) 2 MG capsule Take 2 mg by mouth daily as needed for diarrhea or loose stools.      pantoprazole (PROTONIX) 40 MG tablet Take 1 tablet (40 mg total) by mouth daily. 30 tablet 5   spironolactone (ALDACTONE) 50 MG tablet Take 1 tablet (50 mg total) by mouth daily. 30 tablet 2   omeprazole (PRILOSEC) 20 MG capsule Take 1 capsule (20 mg total) by mouth daily. Office visit for further refills (Patient not taking: Reported on 08/02/2019) 30 capsule 2   ondansetron (ZOFRAN) 8 MG tablet Take 1 tablet (8 mg total) by mouth every 8 (eight) hours as needed for nausea or vomiting. 20 tablet 0   No current facility-administered medications for this visit.     PHYSICAL EXAMINATION: ECOG PERFORMANCE STATUS: 1 - Symptomatic but completely ambulatory  Vitals:   08/02/19 1406  BP: 131/88  Pulse: 98  Resp: 16  Temp: 98.9 F (37.2 C)  SpO2: 98%   Filed Weights   08/02/19 1406  Weight:  97 lb 6.4 oz (44.2 kg)    GENERAL:alert, no distress and comfortable SKIN: psoriatic rash on lower legs  EYES: sclera clear LYMPH:  no palpable cervical or supraclavicular lymphadenopathy LUNGS: clear to auscultation with normal breathing effort HEART: regular rate & rhythm and no murmurs and no lower extremity edema ABDOMEN:abdomen soft, non-tender and normal bowel sounds. No hepatomegaly. Spleen is not palpable. No significant ascites Musculoskeletal: no cyanosis of digits   PSYCH: alert & oriented x 3 with fluent speech NEURO: no focal motor/sensory deficits  LABORATORY DATA:  I have reviewed the data as listed CBC Latest Ref Rng & Units 07/04/2019 08/11/2017 06/16/2017  WBC 4.0 - 10.5 K/uL 4.8 4.1 5.1  Hemoglobin 12.0 - 15.0 g/dL 13.9 13.7 12.6  Hematocrit 36.0 - 46.0 % 41.8 40.2 37.9  Platelets 150 - 400 K/uL 46(L) CANCELED 52(L)   CMP Latest Ref Rng & Units 08/02/2019 07/04/2019 08/11/2017  Glucose 70 - 99 mg/dL 100(H) 107(H) 99  BUN 6 - 23 mg/dL 5(L) 5(L) 6(L)  Creatinine 0.40 - 1.20 mg/dL 0.62 0.63 0.65  Sodium 135 - 145 mEq/L 133(L) 139 137  Potassium 3.5 - 5.1 mEq/L 3.4(L) 3.5 4.0  Chloride 96 - 112 mEq/L 97 107 106  CO2 19 - 32 mEq/L 31 26 24   Calcium 8.4 - 10.5 mg/dL 8.5 8.1(L) 8.4(L)  Total Protein 6.5 - 8.1 g/dL - 7.6 7.1  Total Bilirubin 0.3 - 1.2 mg/dL - 2.0(H) 0.8  Alkaline Phos 38 - 126 U/L - 148(H) -  AST 15 - 41 U/L - 77(H) 62(H)  ALT 0 - 44 U/L - 32 29    RADIOGRAPHIC STUDIES: I have personally reviewed the radiological images as listed and agreed with the findings in the report. Ct Abdomen Pelvis W Contrast  Result Date: 07/04/2019 CLINICAL DATA:  Abdominal distension and pain, hepatitis C bright red blood rectally EXAM: CT ABDOMEN AND PELVIS WITH CONTRAST TECHNIQUE: Multidetector CT imaging of the abdomen and pelvis was performed using the standard protocol following bolus administration of intravenous contrast. CONTRAST:  76mL OMNIPAQUE IOHEXOL 300 MG/ML  SOLN  COMPARISON:  Ultrasounds Apr 06, 2017. FINDINGS: Lower chest: The visualized heart size within normal limits. No pericardial fluid/thickening. There are numerous tortuous varicosity seen surrounding the distal esophagus at the GE junction. The visualized portions of the lungs are clear. Hepatobiliary: There is diffuse low density seen throughout  the liver parenchyma with a nodular contour and hepatomegaly.The main portal vein is patent. No evidence of calcified gallstones, gallbladder wall thickening or biliary dilatation. Pancreas: Unremarkable. No pancreatic ductal dilatation or surrounding inflammatory changes. Spleen: Normal in size without focal abnormality. Tortuous splenic varicosities are seen at the hilum. Adrenals/Urinary Tract: Both adrenal glands appear normal. The kidneys and collecting system appear normal without evidence of urinary tract calculus or hydronephrosis. Bladder is unremarkable. Stomach/Bowel: Numerous varicosities are seen surrounding the lesser curvature of the stomach. The remainder of the small bowel is unremarkable. Air and contrast seen in nondilated loops of colon. No focal area of narrowing for bowel wall thickening.There is contrast seen filling the appendix which measures 1.4 cm in dimensions and has apparent wall thickening. This is best seen on series 2, image 48. There is surrounding fat stranding changes seen in the right lower quadrant which is somewhat obscured due to ascites. Vascular/Lymphatic: There are no enlarged mesenteric, retroperitoneal, or pelvic lymph nodes. A recanalized anterior umbilical vein is present. Mild amount of atherosclerotic calcifications are seen. Reproductive: The uterus and adnexa are unremarkable. Other: AS moderate to large amount of abdominopelvic ascites is seen. 012 Musculoskeletal: No acute or significant osseous findings. IMPRESSION: 1. Findings of cirrhosis and hepatomegaly.  Patent portal vein. 2. Numerous varicosities as described above.  3. Moderate to large amount of abdominopelvic ascites 4. Contrast filled appendix which appears to be a mildly dilated with apparent wall thickening which could be due to surrounding ascites. Electronically Signed   By: Prudencio Pair M.D.   On: 07/04/2019 12:28   Mr Liver W Wo Contrast  Result Date: 07/26/2019 CLINICAL DATA:  Abdominal distention and pain. Hepatitis-C. Rectal bleeding. EXAM: MRI ABDOMEN WITHOUT AND WITH CONTRAST TECHNIQUE: Multiplanar multisequence MR imaging of the abdomen was performed both before and after the administration of intravenous contrast. COMPARISON:  CT abdomen 07/04/2019 and abdominal ultrasound 07/09/2019 FINDINGS: Lower chest: Unremarkable Hepatobiliary: Hepatic cirrhosis with nodular liver contour and typical morphologic changes. Diffuse wall thickening of the gallbladder. Widespread steatosis in the liver. In segment 2 of the liver on image 23/901 there is a 1.1 by 1.0 cm focus of non rim arterial phase enhancement which demonstrates later washout and capsule appearance. LI-RADS category LR-5. Also in the lateral segment left hepatic lobe on image 31/901 there is a 1.5 by 1.3 cm focus of arterial phase enhancement with washout but without definite capsule appearance. PI-RADS category LR-5. Inferiorly in the peripheral portion of the right hepatic lobe there is a 0.7 by 0.7 cm subcapsular focus of accentuated arterial phase enhancement on image 59/901, with a faint suggestion of possible washout on later images, but the small size of the lesion makes this indeterminate. LI-RADS category LR-3. Pancreas:  Unremarkable Spleen:  Unremarkable Adrenals/Urinary Tract:  Unremarkable Stomach/Bowel: Unremarkable Vascular/Lymphatic: Uphill paraesophageal varices. Aortoiliac atherosclerotic vascular disease. Other:  Widespread ascites and mesenteric edema. Musculoskeletal: Unremarkable IMPRESSION: 1. In the lateral segment left hepatic lobe there are two LI-RADS category LR-5 lesions  representing hepatocellular carcinoma. 2. There is also a LI-RADS category LR-3 lesion peripherally in the right hepatic lobe, intermediate probability for hepatocellular carcinoma. 3. Hepatic cirrhosis and diffuse wall thickening of the gallbladder. Widespread steatosis in the liver. 4. 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 5.  Aortic Atherosclerosis (ICD10-I70.0). 6. Widespread ascites and mesenteric edema. Electronically Signed   By: Van Clines M.D.   On: 07/26/2019 12:58   Ir Abdomen US Limited  Result Date: 07/10/2019 CLINICAL DATA:  53 year old  female with HCV cirrhosis and abdominal distension. Evaluate for ascites and drain if present. EXAM: LIMITED ABDOMEN ULTRASOUND FOR ASCITES TECHNIQUE: Limited ultrasound survey for ascites was performed in all four abdominal quadrants. COMPARISON:  Recent prior abdominal ultrasound 07/09/2019 FINDINGS: Sonographic interrogation of the 4 quadrants of the abdomen demonstrates trace perihepatic ascites. There is no sufficient fluid pocket for paracentesis at this time. Paracentesis was deferred. IMPRESSION: Trace perihepatic ascites, insufficient for paracentesis. Electronically Signed   By: Jacqulynn Cadet M.D.   On: 07/10/2019 10:07   US Abdomen Limited Ruq  Result Date: 07/09/2019 CLINICAL DATA:  Chronic hepatitis-C.  Follow-up liver lesion. EXAM: ULTRASOUND ABDOMEN LIMITED RIGHT UPPER QUADRANT COMPARISON:  CT 07/04/2019 FINDINGS: Gallbladder: Mild gallbladder wall thickening to 4 mm. Small amount pericholecystic fluid may relate to ascites. The gallbladder is nondistended. Negative sonographic Murphy's sign. No calculi identified Common bile duct: Diameter: Normal at 3 mm Liver: Liver has uniformly increased echogenicity. Liver has a nodular contour. There is perihepatic fluid consistent ascites. There several discrete hypoechoic lesions in the liver parenchyma of the RIGHT hepatic lobe. These lesions are small measuring  approximately 7 mm each. These lesions are not identified on comparison CT from 07/04/2019. Portal vein is patent on color Doppler imaging with normal direction of blood flow towards the liver. Other: Mild-to-moderate ascites. IMPRESSION: 1. Three small hypoechoic lesions in the RIGHT hepatic lobe measuring less than 1 cm but not evident on comparison contrast enhanced CT from 07/04/2019. Recommend MRI of the abdomen without and with contrast to evaluate these hepatic lesions in cirrhotic patient. 2. Increased liver echogenicity and lobular contour consistent cirrhosis. 3. Moderate mild to moderate volume of ascites. 4. Mild gallbladder wall thickening likely related to ascites. 5. No cholelithiasis or cholecystitis evident Electronically Signed   By: Suzy Bouchard M.D.   On: 07/09/2019 15:17    ASSESSMENT & PLAN: 53 yo female with   1. Multifocal Hepatocellular carcinoma, cT2N0Mx, stage II Pt Is a 53 year old Caucasian female with treated hepatitis C, liver cirrhosis with portal hypertension and varices, ascites, and recent episode of GI bleeding. No evidence of hepatic encephalopathy. She is Child-Pugh class B. She has significant thrombocytopenia, PLT 46K. She has multifocal lesions, two LR-5 lesions in the left hepatic lobe 1.1 x1.0 and 1.5 x 1.3 with typical image findings consistent with Cobb; one lesion in the right hepatic lobe is 0.7 cm, LR-3. Her AFP is in normal range. Given her history of liver disease and imaging findings, her diagnosis of HCC is quite certain.  Her case was reviewed in GI tumor board and a biopsy was not felt to be needed to confirm the diagnosis.  She has what appears to be early stage disease, no abdominopelvic adenopathy.   We did discuss the treatment options of liver transplant versus resection versus liver targeted therapy for [place]. Although her liver tumor is within [place] for liver transplant, she only quit drinking alcohol 1 week ago, she will need to have  quit for approximately 6 months before transplant.  She has strong family support.  We will refer her to Roosevelt Locks, NP at liver clinic for transplant work-up. Even if she is not a transplant candidate, she will need ongoing management for cirrhosis. We will also refer her to Dr. Barry Dienes for surgery to discuss possible resection. If she is not a surgical candidate, she will likely be referred to IR for liver targeted therapy.   We briefly discussed systemic chemotherapy and biological therapy. In general, [place] is  not very sensitive to chemotherapy. Will consider these if she progresses after resection or local therapy.   We will see her back in 3-4 months.   We again discussed alcohol cessation and she is very motivated to quit.  2. 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 She is followed by Dr. Henrene Pastor. She will undergo endoscopy next week. She on lasix and aldactone, ascites nearly resolved clinically. We discussed due to her significant liver disease, varices and bleeding risk, she would be a complicated surgical candidate   3. Hepatitis c -s/p harvoni and ribavirin per Dr. Bradd Burner in 2018  Orders Placed This Encounter  Procedures   CT Chest Wo Contrast    Standing Status:   Future    Standing Expiration Date:   08/01/2020    Order Specific Question:   ** REASON FOR EXAM (FREE TEXT)    Answer:   initial staging [place]    Order Specific Question:   Is patient pregnant?    Answer:   No    Order Specific Question:   Preferred imaging location?    Answer:   [facility]    Order Specific Question:   Radiology Contrast Protocol - do NOT remove file path    Answer:   \[email or web address]   Ambulatory referral to General Surgery    Referral Priority:   Routine    Referral Type:   Surgical    Referral Reason:   Specialty Services Required    Referred to Provider:   Stark Klein, MD      Requested Specialty:   General Surgery    Number of Visits Requested:   1    All questions were answered. The patient knows to call the clinic with any problems, questions or concerns.     Alla Feeling, NP 08/02/2019 4:52 PM  Addendum  I have seen the patient, examined her. I agree with the assessment and and plan and have edited the notes.   Mrs Vandeven is a 53 yo female with PMH of treated Hep C, alcohol abuse, liver cirrhosis with portal hypertension, varices, ascites, presented with image discovered multifocal liver lesions. The two lesion (1.5cm and 1.3cm) in left lobe have typical images features of HCC, and additional 0.7cm lesion in right lobe is indeterminate. We reviewed her images in GI tumor board yesterday, and her two left lobe lesions are consider LR-5, diagnostic for HCC, and no tissue biopsy is recommended.  Her abdominal MRI showed no extrahepatic lesion, will get a CT chest to complete staging.  We discussed treatment options for her early stage liver cancer, including surgical resection, liver transplant, ablation by IR or SBRT. She is open to these options, and agrees to see liver surgeon Dr. Barry Dienes and liver [facility] to discuss liver transplant.  She understands she has to quit alcohol completely, for at least 6 months, to be eligible for liver transplant.  If surgical resection is not offered (due to her high risk for surgery from liver cirrhosis, portal hypertension and thrombocytopenia), she will be referred to IR for ablation while she is being considered for liver transplant.   We discussed the risk of recurrence after definitive therapy, and the role of systemic therapy if she has further disease progression after local therapy, or metastatic disease in the future.  I will follow-up her in my clinic to monitor her disease in the future.  Truitt Merle  08/02/2019

## 2019-08-02 ENCOUNTER — Other Ambulatory Visit (INDEPENDENT_AMBULATORY_CARE_PROVIDER_SITE_OTHER): Payer: BC Managed Care – PPO

## 2019-08-02 ENCOUNTER — Encounter: Payer: Self-pay | Admitting: Internal Medicine

## 2019-08-02 ENCOUNTER — Encounter: Payer: Self-pay | Admitting: Nurse Practitioner

## 2019-08-02 ENCOUNTER — Telehealth: Payer: Self-pay

## 2019-08-02 ENCOUNTER — Inpatient Hospital Stay: Payer: BC Managed Care – PPO | Attending: Nurse Practitioner | Admitting: Nurse Practitioner

## 2019-08-02 ENCOUNTER — Other Ambulatory Visit: Payer: Self-pay

## 2019-08-02 VITALS — BP 131/88 | HR 98 | Temp 98.9°F | Resp 16 | Ht 63.0 in | Wt 97.4 lb

## 2019-08-02 DIAGNOSIS — R5383 Other fatigue: Secondary | ICD-10-CM

## 2019-08-02 DIAGNOSIS — F17210 Nicotine dependence, cigarettes, uncomplicated: Secondary | ICD-10-CM

## 2019-08-02 DIAGNOSIS — F1099 Alcohol use, unspecified with unspecified alcohol-induced disorder: Secondary | ICD-10-CM

## 2019-08-02 DIAGNOSIS — K219 Gastro-esophageal reflux disease without esophagitis: Secondary | ICD-10-CM | POA: Diagnosis not present

## 2019-08-02 DIAGNOSIS — C220 Liver cell carcinoma: Secondary | ICD-10-CM

## 2019-08-02 DIAGNOSIS — R160 Hepatomegaly, not elsewhere classified: Secondary | ICD-10-CM

## 2019-08-02 DIAGNOSIS — K7460 Unspecified cirrhosis of liver: Secondary | ICD-10-CM

## 2019-08-02 DIAGNOSIS — R197 Diarrhea, unspecified: Secondary | ICD-10-CM

## 2019-08-02 DIAGNOSIS — K7011 Alcoholic hepatitis with ascites: Secondary | ICD-10-CM | POA: Diagnosis not present

## 2019-08-02 DIAGNOSIS — K766 Portal hypertension: Secondary | ICD-10-CM | POA: Diagnosis not present

## 2019-08-02 DIAGNOSIS — R110 Nausea: Secondary | ICD-10-CM

## 2019-08-02 LAB — BASIC METABOLIC PANEL
BUN: 5 mg/dL — ABNORMAL LOW (ref 6–23)
CO2: 31 mEq/L (ref 19–32)
Calcium: 8.5 mg/dL (ref 8.4–10.5)
Chloride: 97 mEq/L (ref 96–112)
Creatinine, Ser: 0.62 mg/dL (ref 0.40–1.20)
GFR: 100.97 mL/min (ref >60.00)
Glucose, Bld: 100 mg/dL — ABNORMAL HIGH (ref 70–99)
Potassium: 3.4 mEq/L — ABNORMAL LOW (ref 3.5–5.1)
Sodium: 133 mEq/L — ABNORMAL LOW (ref 135–145)

## 2019-08-02 MED ORDER — ONDANSETRON HCL 8 MG PO TABS: 8.0000 mg | ORAL_TABLET | Freq: Three times a day (TID) | ORAL | 0 refills | Status: DC | PRN

## 2019-08-02 NOTE — Telephone Encounter (Signed)
Called pt to introduce myself and to provide direct contact information. Explained role of Oncology Nurse Navigator and confirmed initial consult for today @ 1:45 with Lacie NP and [name]. Will provide her with educational materials from [facility].

## 2019-08-07 ENCOUNTER — Telehealth: Payer: Self-pay

## 2019-08-07 ENCOUNTER — Other Ambulatory Visit: Payer: Self-pay

## 2019-08-07 ENCOUNTER — Ambulatory Visit (HOSPITAL_COMMUNITY)
Admission: RE | Admit: 2019-08-07 | Discharge: 2019-08-07 | Disposition: A | Discharge status: Home / Self Care | Payer: BC Managed Care – PPO | Source: Ambulatory Visit | Attending: Nurse Practitioner | Admitting: Nurse Practitioner

## 2019-08-07 DIAGNOSIS — C220 Liver cell carcinoma: Secondary | ICD-10-CM

## 2019-08-07 DIAGNOSIS — J439 Emphysema, unspecified: Secondary | ICD-10-CM | POA: Diagnosis not present

## 2019-08-07 DIAGNOSIS — R918 Other nonspecific abnormal finding of lung field: Secondary | ICD-10-CM | POA: Diagnosis not present

## 2019-08-07 NOTE — Telephone Encounter (Signed)
Faxed over referral to [facility] for patient to be seen by Roosevelt Locks NP, sent OV note from 08/02/2019, demographics, insurance card information. Faxed to [phone]

## 2019-08-08 ENCOUNTER — Telehealth: Payer: Self-pay

## 2019-08-08 NOTE — Telephone Encounter (Signed)
Covid-19 screening questions   Do you now or have you had a fever in the last 14 days?  Do you have any respiratory symptoms of shortness of breath or cough now or in the last 14 days?  Do you have any family members or close contacts with diagnosed or suspected Covid-19 in the past 14 days?  Have you been tested for Covid-19 and found to be positive?       

## 2019-08-09 ENCOUNTER — Ambulatory Visit (AMBULATORY_SURGERY_CENTER): Payer: BC Managed Care – PPO | Admitting: Internal Medicine

## 2019-08-09 ENCOUNTER — Other Ambulatory Visit: Payer: Self-pay

## 2019-08-09 ENCOUNTER — Other Ambulatory Visit (INDEPENDENT_AMBULATORY_CARE_PROVIDER_SITE_OTHER): Payer: BC Managed Care – PPO

## 2019-08-09 ENCOUNTER — Encounter: Payer: Self-pay | Admitting: Internal Medicine

## 2019-08-09 VITALS — BP 110/62 | HR 81 | Temp 98.4°F | Resp 17 | Ht 63.0 in | Wt 97.0 lb

## 2019-08-09 DIAGNOSIS — K3189 Other diseases of stomach and duodenum: Secondary | ICD-10-CM | POA: Diagnosis not present

## 2019-08-09 DIAGNOSIS — K7460 Unspecified cirrhosis of liver: Secondary | ICD-10-CM

## 2019-08-09 DIAGNOSIS — I8500 Esophageal varices without bleeding: Secondary | ICD-10-CM | POA: Diagnosis not present

## 2019-08-09 DIAGNOSIS — K219 Gastro-esophageal reflux disease without esophagitis: Secondary | ICD-10-CM

## 2019-08-09 DIAGNOSIS — K766 Portal hypertension: Secondary | ICD-10-CM

## 2019-08-09 LAB — BASIC METABOLIC PANEL
BUN: 10 mg/dL (ref 6–23)
CO2: 29 mEq/L (ref 19–32)
Calcium: 8.4 mg/dL (ref 8.4–10.5)
Chloride: 101 mEq/L (ref 96–112)
Creatinine, Ser: 0.7 mg/dL (ref 0.40–1.20)
GFR: 87.76 mL/min (ref >60.00)
Glucose, Bld: 100 mg/dL — ABNORMAL HIGH (ref 70–99)
Potassium: 3.2 mEq/L — ABNORMAL LOW (ref 3.5–5.1)
Sodium: 137 mEq/L (ref 135–145)

## 2019-08-09 MED ORDER — SODIUM CHLORIDE 0.9 % IV SOLN: 500.0000 mL | Freq: Once | INTRAVENOUS | Status: DC

## 2019-08-09 NOTE — Progress Notes (Signed)
Temperature- June Leland  Large bruised area to right hand after unsuccessful IV attempt- warm pack applied

## 2019-08-09 NOTE — Op Note (Signed)
[facility] Patient Name: Theresa Rocha Procedure Date: 08/09/2019 10:13 AM MRN: [id number] Endoscopist: Docia Chuck. Henrene Pastor , MD Age: 53 Referring MD:  Date of Birth: December 03, 1965 Gender: Female Account #: [id number] Procedure:                Upper GI endoscopy Indications:              Cirrhosis rule out esophageal varices. Previous                            screening examination January 2019 with trace                            varices. Now with decompensated cirrhosis (ascites)                            and small HCC's Medicines:                Monitored Anesthesia Care Procedure:                Pre-Anesthesia Assessment:                           - Prior to the procedure, a History and Physical                            was performed, and patient medications and                            allergies were reviewed. The patient's tolerance of                            previous anesthesia was also reviewed. The risks                            and benefits of the procedure and the sedation                            options and risks were discussed with the patient.                            All questions were answered, and informed consent                            was obtained. Prior Anticoagulants: The patient has                            taken no previous anticoagulant or antiplatelet                            agents. ASA Grade Assessment: III - A patient with                            severe systemic disease. After reviewing the risks  and benefits, the patient was deemed in                            satisfactory condition to undergo the procedure.                           After obtaining informed consent, the endoscope was                            passed under direct vision. Throughout the                            procedure, the patient's blood pressure, pulse, and                            oxygen saturations were monitored  continuously. The                            Endoscope was introduced through the mouth, and                            advanced to the second part of duodenum. The upper                            GI endoscopy was accomplished without difficulty.                            The patient tolerated the procedure well. Scope In: Scope Out: Findings:                 1+ varices (3 columns without stigmata) were found                            in the lower third of the esophagus.                           The stomach revealed mild but diffuse portal                            hypertensive gastropathy. No gastric varices.                           The examined duodenum was normal.                           The cardia and gastric fundus were normal on                            retroflexion. Complications:            No immediate complications. Estimated Blood Loss:     Estimated blood loss: none. Impression:               1. Small esophageal varices  2. Mild diffuse portal hypertensive gastropathy                           3. Otherwise normal EGD. Recommendation:           1. Instructed to take diuretics (Aldactone 50 mg,                            Lasix 20 mg) EVERY OTHER DAY                           2. We will contact you after your blood work from                            today (obtained earlier) returns                           3. Continue low-sodium diet                           4. Continue complete abstinence from alcohol                           5. Office follow-up with Dr. Henrene Pastor in 4 to 6 weeks                           6. Continue follow-up with your other specialists                            as scheduled. Docia Chuck. Henrene Pastor, MD 08/09/2019 10:38:16 AM This report has been signed electronically.

## 2019-08-09 NOTE — Progress Notes (Signed)
PT taken to PACU. Monitors in place. VSS. Report given to RN. 

## 2019-08-09 NOTE — Patient Instructions (Signed)
Continue on Aldactone every day.    Continue low sodium diet.  Complete abstinence from alcohol.  Follow up with Dr. Henrene Pastor in 4-6 weeks.  YOU HAD AN ENDOSCOPIC PROCEDURE TODAY AT [facility]:   Refer to the procedure report that was given to you for any specific questions about what was found during the examination.  If the procedure report does not answer your questions, please call your gastroenterologist to clarify.  If you requested that your care partner not be given the details of your procedure findings, then the procedure report has been included in a sealed envelope for you to review at your convenience later.  YOU SHOULD EXPECT: Some feelings of bloating in the abdomen. Passage of more gas than usual.  Walking can help get rid of the air that was put into your GI tract during the procedure and reduce the bloating. If you had a lower endoscopy (such as a colonoscopy or flexible sigmoidoscopy) you may notice spotting of blood in your stool or on the toilet paper. If you underwent a bowel prep for your procedure, you may not have a normal bowel movement for a few days.  Please Note:  You might notice some irritation and congestion in your nose or some drainage.  This is from the oxygen used during your procedure.  There is no need for concern and it should clear up in a day or so.  SYMPTOMS TO REPORT IMMEDIATELY:    Following upper endoscopy (EGD)  Vomiting of blood or coffee ground material  New chest pain or pain under the shoulder blades  Painful or persistently difficult swallowing  New shortness of breath  Fever of 100F or higher  Black, tarry-looking stools  For urgent or emergent issues, a gastroenterologist can be reached at any hour by calling [phone].   DIET:  We do recommend a small meal at first, but then you may proceed to your regular diet.  Drink plenty of fluids but you should avoid alcoholic beverages for 24 hours.  ACTIVITY:  You should  plan to take it easy for the rest of today and you should NOT DRIVE or use heavy machinery until tomorrow (because of the sedation medicines used during the test).    FOLLOW UP: Our staff will call the number listed on your records 48-72 hours following your procedure to check on you and address any questions or concerns that you may have regarding the information given to you following your procedure. If we do not reach you, we will leave a message.  We will attempt to reach you two times.  During this call, we will ask if you have developed any symptoms of COVID 19. If you develop any symptoms (ie: fever, flu-like symptoms, shortness of breath, cough etc.) before then, please call [phone].  If you test positive for Covid 19 in the 2 weeks post procedure, please call and report this information to [place].    If any biopsies were taken you will be contacted by phone or by letter within the next 1-3 weeks.  Please call us at [phone] if you have not heard about the biopsies in 3 weeks.    SIGNATURES/CONFIDENTIALITY: You and/or your care partner have signed paperwork which will be entered into your electronic medical record.  These signatures attest to the fact that that the information above on your After Visit Summary has been reviewed and is understood.  Full responsibility of the confidentiality of this discharge information lies  with you and/or your care-partner.

## 2019-08-10 ENCOUNTER — Telehealth: Payer: Self-pay | Admitting: Hematology

## 2019-08-10 NOTE — Telephone Encounter (Signed)
I called pt and discussed her CT chest findings. It showed a few (up to 33mm) lung nodules, no high suspicion for metastasis. She is a smoker with emphysema on CT, will repeat CT in 6 months. She has appointment with Dr. Barry Dienes next Monday, will send referral to liver clinic [facility] (I emailed her today). Pt appreciated my call.   Truitt Merle  08/10/2019

## 2019-08-13 ENCOUNTER — Telehealth: Payer: Self-pay | Admitting: *Deleted

## 2019-08-13 ENCOUNTER — Telehealth: Payer: Self-pay

## 2019-08-13 DIAGNOSIS — K7460 Unspecified cirrhosis of liver: Secondary | ICD-10-CM | POA: Diagnosis not present

## 2019-08-13 DIAGNOSIS — C220 Liver cell carcinoma: Secondary | ICD-10-CM | POA: Diagnosis not present

## 2019-08-13 DIAGNOSIS — K766 Portal hypertension: Secondary | ICD-10-CM | POA: Diagnosis not present

## 2019-08-13 NOTE — Telephone Encounter (Signed)
Second follow up call attempt.  Reached voicemail

## 2019-08-13 NOTE — Telephone Encounter (Signed)
Called [phone] and left a messaged we tried to reach pt for a follow up call. maw

## 2019-08-13 NOTE — Telephone Encounter (Signed)
Pt is returning your call and said she is doing okay from her procedure

## 2019-08-15 ENCOUNTER — Other Ambulatory Visit: Payer: Self-pay

## 2019-08-15 ENCOUNTER — Other Ambulatory Visit: Payer: Self-pay | Admitting: General Surgery

## 2019-08-15 DIAGNOSIS — C220 Liver cell carcinoma: Secondary | ICD-10-CM

## 2019-08-15 DIAGNOSIS — K7460 Unspecified cirrhosis of liver: Secondary | ICD-10-CM

## 2019-08-15 MED ORDER — POTASSIUM CHLORIDE ER 20 MEQ PO TBCR: 20.0000 meq | EXTENDED_RELEASE_TABLET | Freq: Every day | ORAL | 0 refills | Status: DC

## 2019-08-16 ENCOUNTER — Encounter: Payer: Self-pay | Admitting: *Deleted

## 2019-08-16 ENCOUNTER — Ambulatory Visit
Admission: RE | Admit: 2019-08-16 | Discharge: 2019-08-16 | Disposition: A | Discharge status: Home / Self Care | Payer: BC Managed Care – PPO | Source: Ambulatory Visit | Attending: General Surgery | Admitting: General Surgery

## 2019-08-16 DIAGNOSIS — C220 Liver cell carcinoma: Secondary | ICD-10-CM

## 2019-08-16 HISTORY — PX: IR RADIOLOGIST EVAL & MGMT: IMG5224

## 2019-08-16 NOTE — Consult Note (Signed)
Chief Complaint: Patient was seen in consultation today for liver ablation at the request of [facility]  Referring Physician(s): Byerly,Faera  History of Present Illness: Theresa Rocha is a 53 y.o. female with a history of chronic hepatitis C infection diagnosed in approximately 2002 and treated with antivirals in 2018 by Dr. Linus Salmons. She developed evidence of cirrhosis also around that time and has a history of moderate/heavy alcohol use. She is followed by Dr. Henrene Pastor who performed EGD last week demonstrating 1+ varices in the lower third of the esophagus and mild portal hypertensive gastropathy of the stomach.  She has not had any history of variceal bleeding.    Recently, she had a CT of the abdomen and pelvis on 07/04/2019 demonstrating cirrhosis and ascites.  By ultrasound, volume of ascites was not large enough for paracentesis.  After starting Aldactone and Lasix she has lost 8 pounds and has had no progressive abdominal distention.  MRI of the abdomen was performed on 07/26/2019 demonstrating 2 small left lobe lesions measuring approximately 1.1 cm and 1.5 cm in greatest diameter and demonstrating increased arterial phase enhancement and washout pattern consistent with small hepatocellular carcinomas (LI-RADS LR-5).  Inferiorly in the right lobe there was a 0.7 cm area of arterial enhancement that was indeterminate (LR-3).  She does have normal AFP level of 5.5, INR 1.5 and thrombocytopenia with platelet count of 46.  She saw Cira Rue, NP and [name] on 08/02/2019.  Systemic therapy was not recommended.  She saw Dr. Barry Dienes the following week who did not recommend surgical resection due to high surgical risk.  CT of the chest on 08/07/2019 demonstrated some tiny pulmonary nodules bilaterally in the setting of emphysematous lung disease due to chronic smoking.  These were felt to be low suspicion for metastatic disease and will be followed.  She is scheduled to see Roosevelt Locks, CRNP in the  [facility] next Monday for initial Hepatology evaluation.   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 She is very functional and has no complaints referable to her liver disease.  She does have chronic diarrhea related to IBS and lactose intolerance.  Past Medical History:  Diagnosis Date   Cancer ([facility])    liver- just diagnosed, not started treatment yet   Cervicalgia    GERD (gastroesophageal reflux disease)    Hepatic cirrhosis (HCC)    Hepatitis C    IBS (irritable bowel syndrome)    Lactose intolerance    Portal hypertension (H[facility])    Raynaud's syndrome    Thrombocytopenia ([place])     Past Surgical History:  Procedure Laterality Date   COLONOSCOPY     HAMMER TOE SURGERY Left 2012   pins placed in great toe, 2nd,3rd, 4th toes, all pins removed except great toe   PELVIC LAPAROSCOPY Bilateral 1992   UPPER GASTROINTESTINAL ENDOSCOPY  2002   had esophageal dilitation    Allergies: Hydromorphone hcl and Aspirin  Medications: Prior to Admission medications   Medication Sig Start Date End Date Taking? Authorizing Provider  furosemide (LASIX) 20 MG tablet Take 1 tablet (20 mg total) by mouth daily. 07/26/19   Zehr, Laban Emperor, PA-C  loperamide (IMODIUM) 2 MG capsule Take 2 mg by mouth daily as needed for diarrhea or loose stools.  [provider]  omeprazole (PRILOSEC) 20 MG capsule Take 1 capsule (20 mg total) by mouth daily. Office visit for further refills Patient not taking: Reported on 08/02/2019 12/18/18   Irene Shipper, MD  ondansetron (ZOFRAN) 8 MG tablet Take 1 tablet (8 mg total) by mouth every 8 (eight) hours as needed for nausea or vomiting. 08/02/19   Alla Feeling, NP  pantoprazole (PROTONIX) 40 MG tablet Take 1 tablet (40 mg total) by mouth daily.  07/26/19   Zehr, Laban Emperor, PA-C  Potassium Chloride ER 20 MEQ TBCR Take 20 mEq by mouth daily. 08/15/19   Irene Shipper, MD  spironolactone (ALDACTONE) 50 MG tablet Take 1 tablet (50 mg total) by mouth daily. 07/26/19   Zehr, Laban Emperor, PA-C     Family History  Problem Relation Age of Onset   Liver cancer Mother    Lung cancer Mother    Lung cancer Father    Liver cancer Maternal Grandmother    Colon cancer Neg Hx    Esophageal cancer Neg Hx    Stomach cancer Neg Hx    Rectal cancer Neg Hx     Social History   Socioeconomic History   Marital status: Married    Spouse name: Not on file   Number of children: Not on file   Years of education: Not on file   Highest education level: Not on file  Occupational History   Not on file  Social Needs   Financial resource strain: Not on file   Food insecurity    Worry: Not on file    Inability: Not on file   Transportation needs    Medical: Not on file    Non-medical: Not on file  Tobacco Use   Smoking status: Current Every Day Smoker    Packs/day: 0.50    Types: Cigarettes    Start date: 11/29/1978   Smokeless tobacco: Never Used  Substance and Sexual Activity   Alcohol use: Yes    Alcohol/week: 6.0 standard drinks    Types: 6 Cans of beer per week    Frequency: Never   Drug use: No   Sexual activity: Not Currently    Partners: Male  Lifestyle   Physical activity    Days per week: Not on file    Minutes per session: Not on file   Stress: Not on file  Relationships   Social connections    Talks on phone: Not on file    Gets together: Not on file    Attends religious service: Not on file    Active member of club or organization: Not on file    Attends meetings of clubs or organizations: Not on file    Relationship status: Not on file  Other Topics Concern   Not on file  Social History Narrative   Not on file    ECOG Status: 0 - Asymptomatic  Review of Systems: A 12 point ROS discussed and  pertinent positives are indicated in the HPI above.  All other systems are negative.  Review of Systems  Constitutional: Negative.   HENT: Negative.   Respiratory: Negative.   Cardiovascular: Negative.   Gastrointestinal: Positive for diarrhea. Negative for abdominal pain, blood in stool, nausea and vomiting.  Genitourinary: Negative.   Musculoskeletal: Negative.   Neurological: Negative.   Hematological: Negative.     Vital Signs: BP 102/69 (BP Location: Left Arm)    Pulse 90    Temp 97.7 F (36.5 C)  LMP 04/20/2013    SpO2 90%   Physical Exam Vitals signs reviewed.  Constitutional:      General: She is not in acute distress.    Appearance: Normal appearance. She is normal weight. She is not ill-appearing, toxic-appearing or diaphoretic.  HENT:     Head: Normocephalic and atraumatic.  Eyes:     General: No scleral icterus. Neck:     Musculoskeletal: Neck supple.  Cardiovascular:     Rate and Rhythm: Normal rate and regular rhythm.     Heart sounds: Normal heart sounds. No murmur. No gallop.   Pulmonary:     Effort: Pulmonary effort is normal. No respiratory distress.     Breath sounds: Normal breath sounds. No stridor. No wheezing, rhonchi or rales.  Abdominal:     General: There is distension.     Palpations: There is no mass.     Tenderness: There is no abdominal tenderness. There is no guarding or rebound.     Hernia: No hernia is present.  Musculoskeletal:        General: No swelling.  Skin:    General: Skin is warm and dry.  Neurological:     General: No focal deficit present.     Mental Status: She is alert and oriented to person, place, and time.     Imaging: Ct Chest Wo Contrast  Result Date: 08/08/2019 CLINICAL DATA:  53 year old female with hepatocellular carcinoma. Staging. EXAM: CT CHEST WITHOUT CONTRAST TECHNIQUE: Multidetector CT imaging of the chest was performed following the standard protocol without IV contrast. COMPARISON:  05/11/2013  FINDINGS: Cardiovascular: The heart size is normal. No substantial pericardial effusion. No thoracic aortic aneurysm. Mediastinum/Nodes: No mediastinal lymphadenopathy. No evidence for gross hilar lymphadenopathy although assessment is limited by the lack of intravenous contrast on today's study. The esophagus has normal imaging features. There is no axillary lymphadenopathy. Lungs/Pleura: Centrilobular and paraseptal emphysema evident. Biapical pleuroparenchymal scarring noted. 2 mm right middle lobe pulmonary nodule visible on 93/5. 3 mm right middle lobe nodule identified on 77/5. 4 mm left upper lobe nodule identified on 74/5. Another nodule in the lingula (86/5) measures 6 mm. No focal airspace consolidation. No pleural effusion. Upper Abdomen: Cirrhotic liver morphology noted. Liver lesions seen on recent MRI not well demonstrated on today's noncontrast CT. Musculoskeletal: No worrisome lytic or sclerotic osseous abnormality. IMPRESSION: 1. Tiny bilateral pulmonary nodules measuring up to 6 mm. These are too small to definitively characterize. Given the history of HCC, close follow-up recommended to ensure stability. 2.  Emphysema. ([id number]) Electronically Signed   By: Misty Stanley M.D.   On: 08/08/2019 08:05   Mr Liver W Wo Contrast  Result Date: 07/26/2019 CLINICAL DATA:  Abdominal distention and pain. Hepatitis-C. Rectal bleeding. EXAM: MRI ABDOMEN WITHOUT AND WITH CONTRAST TECHNIQUE: Multiplanar multisequence MR imaging of the abdomen was performed both before and after the administration of intravenous contrast. COMPARISON:  CT abdomen 07/04/2019 and abdominal ultrasound 07/09/2019 FINDINGS: Lower chest: Unremarkable Hepatobiliary: Hepatic cirrhosis with nodular liver contour and typical morphologic changes. Diffuse wall thickening of the gallbladder. Widespread steatosis in the liver. In segment 2 of the liver on image 23/901 there is a 1.1 by 1.0 cm focus of non rim arterial phase enhancement  which demonstrates later washout and capsule appearance. LI-RADS category LR-5. Also in the lateral segment left hepatic lobe on image 31/901 there is a 1.5 by 1.3 cm focus of arterial phase enhancement with washout but without definite capsule appearance. PI-RADS category LR-5. Inferiorly in  the peripheral portion of the right hepatic lobe there is a 0.7 by 0.7 cm subcapsular focus of accentuated arterial phase enhancement on image 59/901, with a faint suggestion of possible washout on later images, but the small size of the lesion makes this indeterminate. LI-RADS category LR-3. Pancreas:  Unremarkable Spleen:  Unremarkable Adrenals/Urinary Tract:  Unremarkable Stomach/Bowel: Unremarkable Vascular/Lymphatic: Uphill paraesophageal varices. Aortoiliac atherosclerotic vascular disease. Other:  Widespread ascites and mesenteric edema. Musculoskeletal: Unremarkable IMPRESSION: 1. In the lateral segment left hepatic lobe there are two LI-RADS category LR-5 lesions representing hepatocellular carcinoma. 2. There is also a LI-RADS category LR-3 lesion peripherally in the right hepatic lobe, intermediate probability for hepatocellular carcinoma. 3. Hepatic cirrhosis and diffuse wall thickening of the gallbladder. Widespread steatosis in the liver. 4. 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 5.  Aortic Atherosclerosis (ICD10-I70.0). 6. Widespread ascites and mesenteric edema. Electronically Signed   By: Van Clines M.D.   On: 07/26/2019 12:58    Labs:  CBC: Recent Labs    07/04/19 0900  WBC 4.8  HGB 13.9  HCT 41.8  PLT 46*    COAGS: Recent Labs    07/26/19 1505  INR 1.5*    BMP: Recent Labs    07/04/19 0900 08/02/19 1303 08/09/19 0817  NA 139 133* 137  K 3.5 3.4* 3.2*  CL 107 97 101  CO2 '26 31 29  '$ GLUCOSE 107* 100* 100*  BUN 5* 5* 10  CALCIUM 8.1* 8.5 8.4  CREATININE 0.63 0.62 0.70  GFRNONAA >60  --   --   GFRAA >60  --   --     LIVER FUNCTION  TESTS: Recent Labs    07/04/19 0900  BILITOT 2.0*  AST 77*  ALT 32  ALKPHOS 148*  PROT 7.6  ALBUMIN 3.0*    TUMOR MARKERS: Recent Labs    07/26/19 1505  AFPTM 5.5    Assessment and Plan:  I met with Theresa Rocha and her husband Mikki Santee.  We reviewed imaging findings.  The smaller lesion demonstrates more avid hyperenhancement on the early arterial phase and is located very high in the dome of the left lobe just beneath the diaphragm and base of the heart.  This measures just over 1 cm in diameter and is in segment II of the left lobe.  The larger region of abnormal enhancement measures approximately 1.5 cm in greatest diameter and is located at the border of segment II and segment III in the lateral segment.  This is more potentially amenable to percutaneous thermal ablation based on location if it can be visualized with imaging.  Given its subtle appearance by MRI, it may be very difficult to detect by CT and was not visualized on the CT dated 07/04/2019.  The only way potentially to accurately ablate this lesion would be under ultrasound guidance for probe placement.  I reviewed potential ablation of one or both lesions in the left lobe including risks.  The larger lesion is in a safer location for ablation.  The smaller lesion would be very difficult to visualize with any imaging modality and also difficult to treat given its high location just under the diaphragm near the heart.  I told Theresa Rocha that it would be worthwhile to examine the liver thoroughly by ultrasound to see if the left lobe lesions are visible.  If the larger lesion is clearly visualized by ultrasound, I would favor that we perform thermal ablation of this lesion with potential biopsy at that time.  I think  treating the smaller lesion may be quite difficult at this time but will try to visualize this lesion by ultrasound as well.  We have made a plan to look at the liver by ultrasound on 08/24/2019 informally at [facility] and if the  1.5 cm lesion is visible, we will proceed with scheduling thermal ablation of the liver at [facility] under general anesthesia.  She also plans to discuss longer-term plans regarding her liver disease with Dawn Drazek next Monday.  Thank you for this interesting consult.  I greatly enjoyed meeting Theresa Rocha and look forward to participating in their care.  A copy of this report was sent to the requesting provider on this date.  Electronically Signed: Azzie Roup 08/16/2019, 4:31 PM     I spent a total of 40 Minutes in face to face in clinical consultation, greater than 50% of which was counseling/coordinating care for ablation of hepatocellular carcinoma.

## 2019-08-19 DIAGNOSIS — C50919 Malignant neoplasm of unspecified site of unspecified female breast: Secondary | ICD-10-CM | POA: Insufficient documentation

## 2019-08-20 DIAGNOSIS — K7460 Unspecified cirrhosis of liver: Secondary | ICD-10-CM | POA: Diagnosis not present

## 2019-08-20 DIAGNOSIS — R188 Other ascites: Secondary | ICD-10-CM | POA: Diagnosis not present

## 2019-08-20 DIAGNOSIS — I8500 Esophageal varices without bleeding: Secondary | ICD-10-CM | POA: Diagnosis not present

## 2019-08-20 DIAGNOSIS — C220 Liver cell carcinoma: Secondary | ICD-10-CM | POA: Diagnosis not present

## 2019-08-22 DIAGNOSIS — K625 Hemorrhage of anus and rectum: Secondary | ICD-10-CM | POA: Diagnosis not present

## 2019-08-22 DIAGNOSIS — B1920 Unspecified viral hepatitis C without hepatic coma: Secondary | ICD-10-CM | POA: Diagnosis not present

## 2019-08-23 ENCOUNTER — Other Ambulatory Visit (INDEPENDENT_AMBULATORY_CARE_PROVIDER_SITE_OTHER): Payer: BC Managed Care – PPO

## 2019-08-23 ENCOUNTER — Other Ambulatory Visit: Payer: Self-pay

## 2019-08-23 DIAGNOSIS — K7460 Unspecified cirrhosis of liver: Secondary | ICD-10-CM | POA: Diagnosis not present

## 2019-08-23 LAB — BASIC METABOLIC PANEL
BUN: 9 mg/dL (ref 6–23)
CO2: 27 mEq/L (ref 19–32)
Calcium: 8.4 mg/dL (ref 8.4–10.5)
Chloride: 105 mEq/L (ref 96–112)
Creatinine, Ser: 0.65 mg/dL (ref 0.40–1.20)
GFR: 95.59 mL/min (ref >60.00)
Glucose, Bld: 95 mg/dL (ref 70–99)
Potassium: 3.8 mEq/L (ref 3.5–5.1)
Sodium: 136 mEq/L (ref 135–145)

## 2019-08-27 ENCOUNTER — Other Ambulatory Visit: Payer: Self-pay | Admitting: Internal Medicine

## 2019-08-27 DIAGNOSIS — Z1231 Encounter for screening mammogram for malignant neoplasm of breast: Secondary | ICD-10-CM

## 2019-08-29 ENCOUNTER — Other Ambulatory Visit: Payer: Self-pay | Admitting: Hematology

## 2019-08-29 ENCOUNTER — Ambulatory Visit
Admission: RE | Admit: 2019-08-29 | Discharge: 2019-08-29 | Disposition: A | Discharge status: Home / Self Care | Payer: BC Managed Care – PPO | Source: Ambulatory Visit | Attending: Hematology | Admitting: Hematology

## 2019-08-29 ENCOUNTER — Other Ambulatory Visit (HOSPITAL_COMMUNITY): Payer: Self-pay | Admitting: Diagnostic Radiology

## 2019-08-29 ENCOUNTER — Ambulatory Visit
Admission: RE | Admit: 2019-08-29 | Discharge: 2019-08-29 | Disposition: A | Discharge status: Home / Self Care | Payer: BC Managed Care – PPO | Source: Ambulatory Visit | Attending: Internal Medicine | Admitting: Internal Medicine

## 2019-08-29 ENCOUNTER — Other Ambulatory Visit: Payer: Self-pay

## 2019-08-29 DIAGNOSIS — N63 Unspecified lump in unspecified breast: Secondary | ICD-10-CM

## 2019-08-29 DIAGNOSIS — Z1231 Encounter for screening mammogram for malignant neoplasm of breast: Secondary | ICD-10-CM

## 2019-08-29 DIAGNOSIS — N6311 Unspecified lump in the right breast, upper outer quadrant: Secondary | ICD-10-CM | POA: Diagnosis not present

## 2019-08-29 DIAGNOSIS — C50811 Malignant neoplasm of overlapping sites of right female breast: Secondary | ICD-10-CM | POA: Diagnosis not present

## 2019-08-30 DIAGNOSIS — C50919 Malignant neoplasm of unspecified site of unspecified female breast: Secondary | ICD-10-CM

## 2019-08-30 HISTORY — DX: Malignant neoplasm of unspecified site of unspecified female breast: C50.919

## 2019-08-30 NOTE — Progress Notes (Signed)
Interventional Radiology Progress Note  Ultrasound of the liver was performed yesterday informally at the hospital in order to try to determine whether the small left lobe liver lesions would be visible by ultrasound to allow accurate percutaneous ablation.  By ultrasound, the liver is very heterogeneous and nodular in appearance and neither of the areas of suspicious early arterial phase enhancement in the left lobe of the liver could be visualized or reproduced by ultrasound currently.  Trying to ablate one or both areas by contrast-enhanced CT would be extremely difficult given their small size and transient early arterial enhancement by MRI.  Theresa Rocha also now has a palpable right breast mass and is scheduled to have this evaluated at the [facility].  Given this work-up and also the fact that the liver lesions are not visible by ultrasound, I recommended a follow-up MRI of the abdomen with and without contrast either later this month or in November to closely follow the liver lesions.  We will coordinate setting up the MRI study. She did have a consultation with Roosevelt Locks recently and is scheduled to undergo evaluation at [facility] for potential future liver transplantation.  [name]. Kathlene Cote, M.D Pager:  [phone]

## 2019-08-31 ENCOUNTER — Telehealth: Payer: Self-pay | Admitting: *Deleted

## 2019-08-31 ENCOUNTER — Telehealth: Payer: Self-pay | Admitting: Hematology

## 2019-08-31 NOTE — Telephone Encounter (Signed)
Scheduled appt per 10/1 sch message - pt is aware of appt date and time

## 2019-08-31 NOTE — Telephone Encounter (Signed)
Left vm for pt to return call to provide pt with navigation resources and contact information.

## 2019-09-03 ENCOUNTER — Other Ambulatory Visit: Payer: BC Managed Care – PPO

## 2019-09-03 NOTE — Progress Notes (Signed)
[place]   [phone] [phone]   Clinic Follow up Note   Patient Care Team: Seward Carol, MD as PCP - General (Internal Medicine) Arna Snipe, RN as Oncology Nurse Navigator Irene Shipper, MD as Consulting Physician (Gastroenterology) Truitt Merle, MD as Consulting Physician (Hematology) Alla Feeling, NP as Nurse Practitioner (Nurse Practitioner)  Date of Service:  09/06/2019  CHIEF COMPLAINT: Newly diagnosed Right breast cancer and Liver cancer   SUMMARY OF ONCOLOGIC HISTORY: Oncology History Overview Note  Cancer Staging Hepatocellular carcinoma [facility]) Staging form: Liver, AJCC 8th Edition - Clinical stage from 08/02/2019: Stage II (cT2, cN0, cM0) - Signed by Truitt Merle, MD on 08/02/2019  Malignant neoplasm of central portion of right breast in female, estrogen receptor positive ([place]) Staging form: Breast, AJCC 8th Edition - Clinical stage from 08/29/2019: Stage IB (cT2, cN0, cM0, G2, ER+, PR+, HER2-) - Signed by Truitt Merle, MD on 09/05/2019    Hepatocellular carcinoma ([place])  07/04/2019 Imaging   CT AP IMPRESSION: 1. Findings of cirrhosis and hepatomegaly.  Patent portal vein. 2. Numerous varicosities as described above. 3. Moderate to large amount of abdominopelvic ascites 4. Contrast filled appendix which appears to be a mildly dilated with apparent wall thickening which could be due to surrounding ascites.   07/09/2019 Imaging   ABD US IMPRESSION: 1. Three small hypoechoic lesions in the RIGHT hepatic lobe measuring less than 1 cm but not evident on comparison contrast enhanced CT from 07/04/2019. Recommend MRI of the abdomen without and with contrast to evaluate these hepatic lesions in cirrhotic patient. 2. Increased liver echogenicity and lobular contour consistent cirrhosis. 3. Moderate mild to moderate volume of ascites. 4. Mild gallbladder wall thickening likely related to ascites. 5. No cholelithiasis or cholecystitis  evident   07/26/2019 Imaging   MR ABD IMPRESSION: 1. In the lateral segment left hepatic lobe there are two LI-RADS category LR-5 lesions representing hepatocellular carcinoma. 2. There is also a LI-RADS category LR-3 lesion peripherally in the right hepatic lobe, intermediate probability for hepatocellular carcinoma. 3. Hepatic cirrhosis and diffuse wall thickening of the gallbladder. Widespread steatosis in the liver. 4. 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 5.  Aortic Atherosclerosis (ICD10-I70.0). 6. Widespread ascites and mesenteric edema.   07/26/2019 Tumor Marker   AFP 5.5 (baseline)   08/02/2019 Initial Diagnosis   Hepatocellular carcinoma ([place])   08/02/2019 Cancer Staging   Staging form: Liver, AJCC 8th Edition - Clinical stage from 08/02/2019: Stage II (cT2, cN0, cM0) - Signed by Truitt Merle, MD on 08/02/2019   08/07/2019 Imaging   CT Chest 08/07/19 IMPRESSION: 1. Tiny bilateral pulmonary nodules measuring up to 6 mm. These are too small to definitively characterize. Given the history of HCC, close follow-up recommended to ensure stability. 2.  Emphysema. (ICD10-J43.9)   08/09/2019 Procedure   Upper Endoscopy by Dr Henrene Pastor 08/09/19  IMPRESSION 1. Small esophageal varices 2. Mild diffuse portal hypertensive gastropathy 3. Otherwise normal EGD.   Malignant neoplasm of central portion of right breast in female, estrogen receptor positive ([place])  08/29/2019 Cancer Staging   Staging form: Breast, AJCC 8th Edition - Clinical stage from 08/29/2019: Stage IB (cT2, cN0, cM0, G2, ER+, PR+, HER2-) - Signed by Truitt Merle, MD on 09/05/2019   08/29/2019 Mammogram   Diagnostic mammogram 08/29/19 IMPRESSION: Suspicious mass in the 12 o'clock region of the right breast 3 cm from the nipple measuring 3.2 x 1.6 x 2.6 cm   08/29/2019 Initial Biopsy   Diagnosis 08/29/19 Breast,  right, needle core biopsy, 12 o'clock - INVASIVE DUCTAL CARCINOMA, SEE COMMENT.   08/29/2019  Receptors her2   Results: IMMUNOHISTOCHEMICAL AND MORPHOMETRIC ANALYSIS PERFORMED MANUALLY The tumor cells are NEGATIVE for Her2 (1+). Estrogen Receptor: 100%, POSITIVE, STRONG STAINING INTENSITY Progesterone Receptor: 90%, POSITIVE, STRONG STAINING INTENSITY Proliferation Marker Ki67: 15%   09/05/2019 Initial Diagnosis   Malignant neoplasm of central portion of right breast in female, estrogen receptor positive ([place])      CURRENT THERAPY:  PENDING surgery   INTERVAL HISTORY:  Theresa Rocha is here for a follow up. She was to have mammogram before she went on Transplant list. She did not have mammogram since 2013. Before scan she felt the right breast mass herself. Her mammogram showed mass and biopsy confirmed this as breast cancer. She denies any family h/o Breast or Ovarian cancer. She will see Dr. Barry Dienes next week about proceeding with surgery. She notes she went through menopause 2-3 years ago. She notes she checks her breast every time she showers, so it was random for her to feel lump suddenly.  She notes she currently works 2 days a week with varying hours.     REVIEW OF SYSTEMS:   Constitutional: Denies fevers, chills or abnormal weight loss Eyes: Denies blurriness of vision Ears, nose, mouth, throat, and face: Denies mucositis or sore throat Respiratory: Denies cough, dyspnea or wheezes Cardiovascular: Denies palpitation, chest discomfort or lower extremity swelling Gastrointestinal:  Denies nausea, heartburn or change in bowel habits Skin: Denies abnormal skin rashes Lymphatics: Denies new lymphadenopathy or easy bruising Neurological:Denies numbness, tingling or new weaknesses Behavioral/Psych: Mood is stable, no new changes  All other systems were reviewed with the patient and are negative.  MEDICAL HISTORY:  Past Medical History:  Diagnosis Date  . Cancer [facility])    liver- just diagnosed, not started treatment yet  . Cervicalgia   . GERD (gastroesophageal  reflux disease)   . Hepatic cirrhosis ([place])   . Hepatitis C   . IBS (irritable bowel syndrome)   . Lactose intolerance   . Portal hypertension ([place])   . Raynaud's syndrome   . Thrombocytopenia ([place])     SURGICAL HISTORY: Past Surgical History:  Procedure Laterality Date  . COLONOSCOPY    . HAMMER TOE SURGERY Left 2012   pins placed in great toe, 2nd,3rd, 4th toes, all pins removed except great toe  . IR RADIOLOGIST EVAL & MGMT  08/16/2019  . PELVIC LAPAROSCOPY Bilateral 1992  . UPPER GASTROINTESTINAL ENDOSCOPY  2002   had esophageal dilitation    I have reviewed the social history and family history with the patient and they are unchanged from previous note.  ALLERGIES:  is allergic to hydromorphone hcl and aspirin.  MEDICATIONS:  Current Outpatient Medications  Medication Sig Dispense Refill  . furosemide (LASIX) 20 MG tablet Take 1 tablet (20 mg total) by mouth daily. 30 tablet 2  . loperamide (IMODIUM) 2 MG capsule Take 2 mg by mouth daily as needed for diarrhea or loose stools.     . ondansetron (ZOFRAN) 8 MG tablet Take 1 tablet (8 mg total) by mouth every 8 (eight) hours as needed for nausea or vomiting. 20 tablet 0  . pantoprazole (PROTONIX) 40 MG tablet Take 1 tablet (40 mg total) by mouth daily. 30 tablet 5  . Potassium Chloride ER 20 MEQ TBCR Take 20 mEq by mouth daily. 30 tablet 0  . spironolactone (ALDACTONE) 50 MG tablet Take 1 tablet (50 mg total) by mouth daily.  30 tablet 2   No current facility-administered medications for this visit.     PHYSICAL EXAMINATION: ECOG PERFORMANCE STATUS: 1 - Symptomatic but completely ambulatory  Vitals:   09/06/19 1537  BP: 107/70  Pulse: 82  Resp: 18  Temp: 99.1 F (37.3 C)  SpO2: 99%   Filed Weights   09/06/19 1537  Weight: 101 lb 11.2 oz (46.1 kg)    GENERAL:alert, no distress and comfortable SKIN: skin color, texture, turgor are normal, no rashes or significant lesions EYES: normal, Conjunctiva are pink and  non-injected, sclera clear  NECK: supple, thyroid normal size, non-tender, without nodularity LYMPH:  no palpable lymphadenopathy in the cervical, axillary  LUNGS: clear to auscultation and percussion with normal breathing effort HEART: regular rate & rhythm and no murmurs and no lower extremity edema ABDOMEN:abdomen soft, non-tender and normal bowel sounds Musculoskeletal:no cyanosis of digits and no clubbing  NEURO: alert & oriented x 3 with fluent speech, no focal motor/sensory deficits BREAST: (+) Diffuse skin ecchymosis around nipple. (+) 5x3cm mass above nipple, tender, likely enlarged from bleeding from biopsy.   LABORATORY DATA:  I have reviewed the data as listed CBC Latest Ref Rng & Units 09/06/2019 07/04/2019 08/11/2017  WBC 4.0 - 10.5 K/uL 6.0 4.8 4.1  Hemoglobin 12.0 - 15.0 g/dL 12.9 13.9 13.7  Hematocrit 36.0 - 46.0 % 38.6 41.8 40.2  Platelets 150 - 400 K/uL 69(L) 46(L) CANCELED     CMP Latest Ref Rng & Units 09/06/2019 08/23/2019 08/09/2019  Glucose 70 - 99 mg/dL 120(H) 95 100(H)  BUN 6 - 20 mg/dL _0 Creatinine 0.44 - 1.00 mg/dL 0.74 0.65 0.70  Sodium 135 - 145 mmol/L 138 136 137  Potassium 3.5 - 5.1 mmol/L 3.7 3.8 3.2(L)  Chloride 98 - 111 mmol/L 105 105 101  CO2 22 - 32 mmol/L _1 Calcium 8.9 - 10.3 mg/dL 8.1(L) 8.4 8.4  Total Protein 6.5 - 8.1 g/dL 6.8 - -  Total Bilirubin 0.3 - 1.2 mg/dL 1.3(H) - -  Alkaline Phos 38 - 126 U/L 122 - -  AST 15 - 41 U/L 40 - -  ALT 0 - 44 U/L 23 - -      RADIOGRAPHIC STUDIES: I have personally reviewed the radiological images as listed and agreed with the findings in the report. No results found.   ASSESSMENT & PLAN:  Theresa Rocha is a 53 y.o. female with    1. Malignant neoplasm of central portion of right breast, Stage II, c(T2N0M0), ER/PR+, HER2-, Grade II -We discussed her image findings and the biopsy results in great details. She has invasive ductal carcinoma of right breast.  -We discussed surgical  resection is the only way to cure it. She will see  by Dr. Barry Dienes next week to discuss this and will likely proceed with surgery soon.  -Given her high breast density, bigger size on exam than what we seen on images, I recommend breast MRI before surgery for further evaluation.   -I discussed the risk of recurrence after surgical resection, which depends on staging and biology of the tumor.  I recommend a Oncotype Dx test on the surgical sample and we'll make a decision about adjuvant chemotherapy based on the Oncotype result. Written material of this test was given to her. She is young and fit, would be a good candidate for chemotherapy if her Oncotype recurrence score is high. -If her surgical sentinel lymph node positive, I recommend mammaprint for further risk stratification  and guide adjuvant chemotherapy. -I discussed liver cancer can limited the chemo treatment she receives. She understands.  -I discussed the role of adjuvant radiation, especially after lumpectomy, to reduce her risk of local recurrence.  -Given the strong ER and PR expression in her postmenopausal status, I recommend adjuvant endocrine therapy with aromatase inhibitor for a total of 5-10 years to reduce the risk of cancer recurrence. Potential benefits and side effects were discussed with patient and she is interested. -We also discussed the breast cancer surveillance after her surgery. She will continue annual screening mammogram, self exam, and a routine office visit with lab and exam with [place]. -I encouraged her to have healthy diet and exercise regularly -Labs reviewed today, CBC and CMP WNL except PLT 69K, BG 120, Ca 8.1, Albumin 2.9, Tbili 1.3. Physical exam today shows her right breast mass is about 5cm, which appears to be much bigger than the size on image -F/u with her after surgery or radiation   2. Multifocal Hepatocellular carcinoma, cT2N0Mx, stage II -She initially presented with multifocal lesions of liver  consistent with [place] with indeterminate right lobe lesions. Her AFP is in normal range.  -Given her history of liver disease and imaging findings, her diagnosis of HCC is quite certain.  Her case was reviewed in GI tumor board and a biopsy was not felt to be needed to confirm the diagnosis. She has what appears to be early stage disease, no evidence of distant metastasis --She was seen by liver clinic Dawn Drazak to discuss liver transplant. She started workup and screening for transplant when she was diagnosed with right breast cancer.  Unfortunately the breast cancer diagnosis and treatment will likely put her liver transplant on hold. She will discuss with Dawn further  -She was seen by Dr. Barry Dienes and Dr. Kathlene Cote, unfortunately she was felt to be not a good candidate for surgical resection of liver targeted ablation at this point due to her liver function and location of tumors.  Dr. Kathlene Cote recommended close follow-up -I discussed her breast cancer with Dr. Barry Dienes and Dr. Kathlene Cote and agreed to wait on Liver treatment decisions until we treat her breast cancer. She is agreeable.    3. 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  -08/09/19 Upper endoscopy did show small Varices, no bleeding.  -She is followed by Dr. Henrene Pastor.  -She on lasix and aldactone, ascites nearly resolved clinically. We discussed due to her significant liver disease, varices and bleeding risk, she would be a complicated surgical candidate.    4. Hepatitis c -s/p harvoni and ribavirin per Dr. Bradd Burner in 2018   5. Alcohol and smoking Cessation  -She is current every day smoker, 0.5 PPD x35 years.  -She drank 3-4 cans beer nightly x20 years. -Cessation of both was discussed and strongly encouraged.  -CT chest from 08/08/19 shows tiny b/l pulmonary nodules, which are too small to characterize, will monitor.   6. Low Weight  -I encouraged her  to increase calorie, protein and carbohydrate intake wo help her gain weight before breast surgery.   7.  Thrombocytopenia -Secondary to liver cirrhosis and splenomegaly -I discussed the option of thrombopoietin agonist, such as Doptelet, before her breast surgery to reduce her risk of recurrence   PLAN:  -Proceed with breast surgery soon  -Oncotype on breast surgical sample  -F/u after surgery or radiation  -Copy note to Heide Spark, Dr Barry Dienes and Dr Kathlene Cote.  No problem-specific Assessment & Plan notes found for this encounter.   No orders of the defined types were placed in this encounter.  All questions were answered. The patient knows to call the clinic with any problems, questions or concerns. No barriers to learning was detected. I spent 30 minutes counseling the patient face to face. The total time spent in the appointment was 40 minutes and more than 50% was on counseling and review of test results     Truitt Merle, MD 09/06/2019   I, Joslyn Devon, am acting as scribe for Truitt Merle, MD.   I have reviewed the above documentation for accuracy and completeness, and I agree with the above.

## 2019-09-05 ENCOUNTER — Ambulatory Visit: Payer: BC Managed Care – PPO | Admitting: Hematology

## 2019-09-05 ENCOUNTER — Other Ambulatory Visit: Payer: BC Managed Care – PPO

## 2019-09-05 ENCOUNTER — Other Ambulatory Visit: Payer: Self-pay

## 2019-09-05 DIAGNOSIS — C220 Liver cell carcinoma: Secondary | ICD-10-CM

## 2019-09-05 DIAGNOSIS — C50111 Malignant neoplasm of central portion of right female breast: Secondary | ICD-10-CM | POA: Insufficient documentation

## 2019-09-06 ENCOUNTER — Other Ambulatory Visit: Payer: Self-pay

## 2019-09-06 ENCOUNTER — Inpatient Hospital Stay: Payer: BC Managed Care – PPO | Attending: Hematology

## 2019-09-06 ENCOUNTER — Inpatient Hospital Stay (HOSPITAL_BASED_OUTPATIENT_CLINIC_OR_DEPARTMENT_OTHER): Payer: BC Managed Care – PPO | Admitting: Hematology

## 2019-09-06 VITALS — BP 107/70 | HR 82 | Temp 99.1°F | Resp 18 | Ht 63.0 in | Wt 101.7 lb

## 2019-09-06 DIAGNOSIS — R918 Other nonspecific abnormal finding of lung field: Secondary | ICD-10-CM | POA: Diagnosis not present

## 2019-09-06 DIAGNOSIS — C50111 Malignant neoplasm of central portion of right female breast: Secondary | ICD-10-CM | POA: Diagnosis not present

## 2019-09-06 DIAGNOSIS — C220 Liver cell carcinoma: Secondary | ICD-10-CM | POA: Diagnosis not present

## 2019-09-06 DIAGNOSIS — Z170 Estrogen receptor positive status [ER+]: Secondary | ICD-10-CM

## 2019-09-06 DIAGNOSIS — K7031 Alcoholic cirrhosis of liver with ascites: Secondary | ICD-10-CM | POA: Diagnosis not present

## 2019-09-06 DIAGNOSIS — F1010 Alcohol abuse, uncomplicated: Secondary | ICD-10-CM | POA: Diagnosis not present

## 2019-09-06 DIAGNOSIS — B182 Chronic viral hepatitis C: Secondary | ICD-10-CM

## 2019-09-06 DIAGNOSIS — D6959 Other secondary thrombocytopenia: Secondary | ICD-10-CM | POA: Insufficient documentation

## 2019-09-06 DIAGNOSIS — K766 Portal hypertension: Secondary | ICD-10-CM | POA: Insufficient documentation

## 2019-09-06 DIAGNOSIS — B1920 Unspecified viral hepatitis C without hepatic coma: Secondary | ICD-10-CM | POA: Insufficient documentation

## 2019-09-06 DIAGNOSIS — I8510 Secondary esophageal varices without bleeding: Secondary | ICD-10-CM | POA: Diagnosis not present

## 2019-09-06 DIAGNOSIS — Z01419 Encounter for gynecological examination (general) (routine) without abnormal findings: Secondary | ICD-10-CM | POA: Diagnosis not present

## 2019-09-06 DIAGNOSIS — F17210 Nicotine dependence, cigarettes, uncomplicated: Secondary | ICD-10-CM | POA: Insufficient documentation

## 2019-09-06 DIAGNOSIS — Z681 Body mass index (BMI) 19 or less, adult: Secondary | ICD-10-CM | POA: Diagnosis not present

## 2019-09-06 LAB — CBC WITH DIFFERENTIAL (CANCER CENTER ONLY)
Abs Immature Granulocytes: 0.02 10*3/uL (ref 0.00–0.07)
Basophils Absolute: 0.1 10*3/uL (ref 0.0–0.1)
Basophils Relative: 1 %
Eosinophils Absolute: 0.2 10*3/uL (ref 0.0–0.5)
Eosinophils Relative: 3 %
HCT: 38.6 % (ref 36.0–46.0)
Hemoglobin: 12.9 g/dL (ref 12.0–15.0)
Immature Granulocytes: 0 %
Lymphocytes Relative: 24 %
Lymphs Abs: 1.4 10*3/uL (ref 0.7–4.0)
MCH: 32.7 pg (ref 26.0–34.0)
MCHC: 33.4 g/dL (ref 30.0–36.0)
MCV: 98 fL (ref 80.0–100.0)
Monocytes Absolute: 0.6 10*3/uL (ref 0.1–1.0)
Monocytes Relative: 10 %
Neutro Abs: 3.7 10*3/uL (ref 1.7–7.7)
Neutrophils Relative %: 62 %
Platelet Count: 69 10*3/uL — ABNORMAL LOW (ref 150–400)
RBC: 3.94 MIL/uL (ref 3.87–5.11)
RDW: 14 % (ref 11.5–15.5)
WBC Count: 6 10*3/uL (ref 4.0–10.5)
nRBC: 0 % (ref 0.0–0.2)

## 2019-09-06 LAB — CMP (CANCER CENTER ONLY)
ALT: 23 U/L (ref 0–44)
AST: 40 U/L (ref 15–41)
Albumin: 2.9 g/dL — ABNORMAL LOW (ref 3.5–5.0)
Alkaline Phosphatase: 122 U/L (ref 38–126)
Anion gap: 7 (ref 5–15)
BUN: 10 mg/dL (ref 6–20)
CO2: 26 mmol/L (ref 22–32)
Calcium: 8.1 mg/dL — ABNORMAL LOW (ref 8.9–10.3)
Chloride: 105 mmol/L (ref 98–111)
Creatinine: 0.74 mg/dL (ref 0.44–1.00)
GFR, Est AFR Am: >60 mL/min (ref >60)
GFR, Estimated: >60 mL/min (ref >60)
Glucose, Bld: 120 mg/dL — ABNORMAL HIGH (ref 70–99)
Potassium: 3.7 mmol/L (ref 3.5–5.1)
Sodium: 138 mmol/L (ref 135–145)
Total Bilirubin: 1.3 mg/dL — ABNORMAL HIGH (ref 0.3–1.2)
Total Protein: 6.8 g/dL (ref 6.5–8.1)

## 2019-09-07 ENCOUNTER — Encounter: Payer: Self-pay | Admitting: *Deleted

## 2019-09-07 ENCOUNTER — Telehealth: Payer: Self-pay | Admitting: Hematology

## 2019-09-07 ENCOUNTER — Telehealth: Payer: Self-pay | Admitting: *Deleted

## 2019-09-07 ENCOUNTER — Encounter: Payer: Self-pay | Admitting: Hematology

## 2019-09-07 NOTE — Telephone Encounter (Signed)
Left vm regarding new pt appt with [name] to assess for needs or questions regarding dx or treatment care plan. Contact information provided for questions or needs.

## 2019-09-07 NOTE — Telephone Encounter (Signed)
No los per 10/8. °

## 2019-09-10 ENCOUNTER — Other Ambulatory Visit: Payer: Self-pay | Admitting: General Surgery

## 2019-09-10 ENCOUNTER — Other Ambulatory Visit: Payer: Self-pay | Admitting: Hematology

## 2019-09-10 DIAGNOSIS — Z170 Estrogen receptor positive status [ER+]: Secondary | ICD-10-CM

## 2019-09-10 DIAGNOSIS — C50111 Malignant neoplasm of central portion of right female breast: Secondary | ICD-10-CM

## 2019-09-10 DIAGNOSIS — C50411 Malignant neoplasm of upper-outer quadrant of right female breast: Secondary | ICD-10-CM

## 2019-09-10 NOTE — Progress Notes (Signed)
Breast  

## 2019-09-11 DIAGNOSIS — C50919 Malignant neoplasm of unspecified site of unspecified female breast: Secondary | ICD-10-CM | POA: Diagnosis not present

## 2019-09-11 DIAGNOSIS — K7469 Other cirrhosis of liver: Secondary | ICD-10-CM | POA: Diagnosis not present

## 2019-09-11 DIAGNOSIS — C220 Liver cell carcinoma: Secondary | ICD-10-CM | POA: Diagnosis not present

## 2019-09-13 ENCOUNTER — Telehealth: Payer: Self-pay | Admitting: *Deleted

## 2019-09-13 ENCOUNTER — Other Ambulatory Visit: Payer: Self-pay

## 2019-09-13 ENCOUNTER — Ambulatory Visit (HOSPITAL_COMMUNITY)
Admission: RE | Admit: 2019-09-13 | Discharge: 2019-09-13 | Disposition: A | Discharge status: Home / Self Care | Payer: BC Managed Care – PPO | Source: Ambulatory Visit | Attending: Hematology | Admitting: Hematology

## 2019-09-13 DIAGNOSIS — C50911 Malignant neoplasm of unspecified site of right female breast: Secondary | ICD-10-CM | POA: Diagnosis not present

## 2019-09-13 DIAGNOSIS — C50111 Malignant neoplasm of central portion of right female breast: Secondary | ICD-10-CM | POA: Diagnosis not present

## 2019-09-13 DIAGNOSIS — Z170 Estrogen receptor positive status [ER+]: Secondary | ICD-10-CM | POA: Insufficient documentation

## 2019-09-13 MED ORDER — GADOBUTROL 1 MMOL/ML IV SOLN
5.0000 mL | Freq: Once | INTRAVENOUS | Status: AC | PRN
  Administered 2019-09-13: 5 mL via INTRAVENOUS

## 2019-09-13 NOTE — Telephone Encounter (Signed)
Pt called requesting msg be sent to [name] with sx date information and need for medication to take prior to sx. Informed pt that msg has been sent to Dr. Donneta Romberg NP and nurse. Denies further questions or needs at this time.

## 2019-09-16 ENCOUNTER — Other Ambulatory Visit: Payer: Self-pay | Admitting: Hematology

## 2019-09-16 MED ORDER — ANASTROZOLE 1 MG PO TABS: 1.0000 mg | ORAL_TABLET | Freq: Every day | ORAL | 2 refills | Status: DC

## 2019-09-16 MED ORDER — DOPTELET 20 MG PO TABS: 40.0000 mg | ORAL_TABLET | Freq: Every day | ORAL | 0 refills | Status: DC

## 2019-09-17 ENCOUNTER — Encounter: Payer: Self-pay | Admitting: *Deleted

## 2019-09-17 ENCOUNTER — Telehealth: Payer: Self-pay | Admitting: *Deleted

## 2019-09-17 ENCOUNTER — Telehealth: Payer: Self-pay | Admitting: Nurse Practitioner

## 2019-09-17 MED FILL — ANASTROZOLE 1 MG TABLET: 1 | 30 days supply | Qty: 30 | Fill #0

## 2019-09-17 NOTE — Telephone Encounter (Signed)
I called [name] to review recommendations to begin Doptelet 40 mg daily x5 days, starting 10 days before breast surgery. I also reviewed rationale for beginning Anastrozole while she awaits surgery and potential side effects of AI. She agrees to the recommendations and will start. Doptelet is pending authorization. She has DEXA scheduled this week at [facility]. She will request the report to be sent over to me. She verbalizes understanding and appreciates the call. Theresa Rue, NP  09/17/2019

## 2019-09-17 NOTE — Telephone Encounter (Signed)
[place] faxed Prior authorization request for Doptelet 20 MG tabs.  Request to [employer] letter tray receptacle of Prior Authorization requests and forms for review.

## 2019-09-18 ENCOUNTER — Telehealth: Payer: Self-pay | Admitting: Pharmacist

## 2019-09-18 DIAGNOSIS — B182 Chronic viral hepatitis C: Secondary | ICD-10-CM

## 2019-09-18 NOTE — Telephone Encounter (Signed)
Oral Oncology Pharmacist Encounter  Received new prescription for Doptelet (avatrombopag) for the treatment of chronic liver disease thrombocytopenia prior to surgery, planned duration 5 days.  Patient is planned for resection of breast cancer on 10/16/19 Avatrombopag is planned to be administered at 40 mg once daily for 5 days, starting 10 days prior to scheduled surgery. Start date planned for 10/06/19 Prescribing information states patient should undergo procedure 5-8 days after last avatrombopag dose  Labs from 09/06/19 assessed, OK for treatment course. Noted total bilirubin is 1.3 mg/dL, slightly above normal limits, no dose adjustment per manufacturer Noted pltc=69k  Current medication list in Epic reviewed, no DDIs with avatrombopag identified.  Prescription has been e-scribed to the [facility] for benefits analysis and approval by MD on 09/16/19.  Oral Oncology Clinic will continue to follow for insurance authorization, copayment issues, initial counseling and start date.  Johny Drilling, PharmD, BCPS, BCOP  09/18/2019 3:22 PM Oral Oncology Clinic [phone]

## 2019-09-19 ENCOUNTER — Ambulatory Visit: Payer: BC Managed Care – PPO | Admitting: Internal Medicine

## 2019-09-21 ENCOUNTER — Other Ambulatory Visit: Payer: Self-pay

## 2019-09-21 ENCOUNTER — Other Ambulatory Visit (INDEPENDENT_AMBULATORY_CARE_PROVIDER_SITE_OTHER): Payer: BC Managed Care – PPO

## 2019-09-21 DIAGNOSIS — K7460 Unspecified cirrhosis of liver: Secondary | ICD-10-CM

## 2019-09-21 DIAGNOSIS — Z13820 Encounter for screening for osteoporosis: Secondary | ICD-10-CM | POA: Diagnosis not present

## 2019-09-21 LAB — BASIC METABOLIC PANEL
BUN: 8 mg/dL (ref 6–23)
CO2: 26 mEq/L (ref 19–32)
Calcium: 8.2 mg/dL — ABNORMAL LOW (ref 8.4–10.5)
Chloride: 104 mEq/L (ref 96–112)
Creatinine, Ser: 0.69 mg/dL (ref 0.40–1.20)
GFR: 89.19 mL/min (ref >60.00)
Glucose, Bld: 110 mg/dL — ABNORMAL HIGH (ref 70–99)
Potassium: 3.7 mEq/L (ref 3.5–5.1)
Sodium: 136 mEq/L (ref 135–145)

## 2019-09-25 MED ORDER — DOPTELET 20 MG PO TABS: 40.0000 mg | ORAL_TABLET | Freq: Every day | ORAL | 0 refills | Status: DC

## 2019-09-25 NOTE — Telephone Encounter (Addendum)
Oral Chemotherapy Pharmacist Encounter   I spoke with patient for overview of new oral chemotherapy medication: Doptelet (avatrombopag) for the treatment of chronic liver disease thrombocytopenia prior to surgery, planned duration 5 days.  Patient is planned for resection of breast cancer on 10/16/19 Avatrombopag is planned to be administered at 40 mg once daily for 5 days, starting 10 days prior to scheduled surgery. Start date planned for 10/06/19 Prescribing information states patient should undergo procedure 5-8 days after last avatrombopag dose  Counseled patient on administration, dosing, side effects, monitoring, drug-food interactions, safe handling, storage, and disposal.  Patient will take Doptelet 20mg  tablets, 2 tablets (40 mg) by mouth once daily, taken with food, for 5 days.  Doptelet start date: 10/06/19  Side effects include but not limited to: headache, arthralgias, fatigue, bruising, petechia, gingival hemorrhage, epistaxis, upper respiratory tract infection, or fever.   Thrombotic and thromboembolic complications with Doptelet have occurred (<10% incidence).  Reviewed with patient importance of keeping a medication schedule and plan for any missed doses.  Medication reconciliation performed and medication/allergy list updated.  Insurance authorization for Doptelet was not required at this time. Prescription must be filled a [facility] specialty pharmacy per insurance requirement. Patient informed prescription sent to [facility] today. We will follow-up with dispensing pharmacy to ensure timely delivery of the Doptelet to patient.  Patient states she is experiencing headaches and back pain since starting anastrozole on 09/22/19. She will alert the office is this persists or worsens. Patient informed [name] may be able to change to a different aromatase inhibitor to minimize adverse effects.  Patient also with questions about filling out accomodation paperwork for the time  she will ned to be out of work secondary to surgery and chest wall radiation. She plans to drop this paperwork at [facility] tomorrow. Patient questions how long to ask for accomodation and was instructed to ask surgeon or [name] for better information regarding this.  All questions answered.  Ms. Delaune voiced understanding and appreciation.   Patient knows to call the office with questions or concerns.  Johny Drilling, PharmD, BCPS, BCOP  09/25/2019  9:31 AM Oral Oncology Clinic [phone]

## 2019-09-25 NOTE — Telephone Encounter (Signed)
Oral Chemotherapy Pharmacist Encounter   Received notification that insurance authorization for Doptelet (avatrombopag) was not required at this time. Prescription must be filled a [facility] specialty pharmacy per insurance requirement.  Prescription for avatrombopag 20 mg tablets, take 2 tablets (40 mg) daily with food for 5 days, 11/7-11/12, quantity #10, refills=0, has been e-scribed to [facility].  Attempted to reach patient to provide update and offer for initial counseling on Doptelet.  No answer.  Left voicemail for patient to call back to discuss details of medication acquisition and initial counseling session.  Johny Drilling, PharmD, BCPS, BCOP  09/25/2019   2:24 PM Oral Oncology Clinic [phone]

## 2019-09-26 ENCOUNTER — Ambulatory Visit: Payer: BC Managed Care – PPO | Admitting: Internal Medicine

## 2019-09-26 ENCOUNTER — Encounter: Payer: Self-pay | Admitting: Internal Medicine

## 2019-09-26 VITALS — BP 92/60 | HR 60 | Temp 97.8°F | Ht 63.0 in | Wt 99.6 lb

## 2019-09-26 DIAGNOSIS — K766 Portal hypertension: Secondary | ICD-10-CM | POA: Diagnosis not present

## 2019-09-26 DIAGNOSIS — K7031 Alcoholic cirrhosis of liver with ascites: Secondary | ICD-10-CM | POA: Diagnosis not present

## 2019-09-26 DIAGNOSIS — I8500 Esophageal varices without bleeding: Secondary | ICD-10-CM | POA: Diagnosis not present

## 2019-09-26 DIAGNOSIS — K7011 Alcoholic hepatitis with ascites: Secondary | ICD-10-CM

## 2019-09-26 DIAGNOSIS — K3189 Other diseases of stomach and duodenum: Secondary | ICD-10-CM

## 2019-09-26 DIAGNOSIS — K219 Gastro-esophageal reflux disease without esophagitis: Secondary | ICD-10-CM

## 2019-09-26 MED ORDER — FUROSEMIDE 20 MG PO TABS: 20.0000 mg | ORAL_TABLET | Freq: Every day | ORAL | 6 refills | Status: DC

## 2019-09-26 MED ORDER — PANTOPRAZOLE SODIUM 40 MG PO TBEC: 40.0000 mg | DELAYED_RELEASE_TABLET | Freq: Every day | ORAL | 6 refills | Status: DC

## 2019-09-26 MED ORDER — SPIRONOLACTONE 50 MG PO TABS: 50.0000 mg | ORAL_TABLET | Freq: Every day | ORAL | 6 refills | Status: DC

## 2019-09-26 NOTE — Patient Instructions (Addendum)
We have sent the following medications to your pharmacy for you to pick up at your convenience:  Aldactone, Lasix, Pantoprazole  Please follow up in 6 months

## 2019-09-26 NOTE — Progress Notes (Signed)
HISTORY OF PRESENT ILLNESS:  Theresa Rocha is a 53 y.o. 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  She was seen in this clinic in recent months with decompensated cirrhosis as manifested by ascites.  She was placed on diuretics.  No significant ascites on ultrasound.  Upper endoscopy August 09, 2019 with 1+ varices without stigmata.  Mild portal hypertensive gastropathy.  She has been seen by the transplant clinic [facility]) at [facility] August 20, 2019.  I have reviewed that note.  Meld score 14.  The potential for transplantation was discussed.  She has remained abstinent from alcohol.  Feeling better with improved energy.  Taking diuretics every other day with stable weight.  Her only complaint is inverted sleep pattern.  She was evaluated for probable small HCC which was not accessible for ablation therapy but is being monitored closely.  Unfortunately, also diagnosed with intraductal or breast cancer for which she is anticipating lumpectomy with sentinel node November 17.  Adjuvant therapy has not been determined though hopeful for radiation only.  I have reviewed interval x-rays and laboratories from September 06, 2019.  Total bilirubin 1.3.  Normal transaminases.  Albumin 2.9.  Last colonoscopy September 2011 was normal  REVIEW OF SYSTEMS:  All non-GI ROS negative unless otherwise stated in the HPI.  Past Medical History:  Diagnosis Date  . Breast cancer ([place]) 08/30/2019  . Cancer [facility])    liver- just diagnosed, not started treatment yet  . Cervicalgia   . GERD (gastroesophageal reflux disease)   . Hepatic cirrhosis ([place])   . Hepatitis C   . IBS (irritable bowel syndrome)   . Lactose intolerance   . Portal hypertension ([place])   . Raynaud's syndrome   . Thrombocytopenia ([place])     Past Surgical History:  Procedure Laterality Date  . COLONOSCOPY    . HAMMER TOE SURGERY Left  2012   pins placed in great toe, 2nd,3rd, 4th toes, all pins removed except great toe  . IR RADIOLOGIST EVAL & MGMT  08/16/2019  . PELVIC LAPAROSCOPY Bilateral 1992  . UPPER GASTROINTESTINAL ENDOSCOPY  2002   had esophageal dilitation    Social History Theresa Rocha  reports that she has been smoking cigarettes. She started smoking about 40 years ago. She has been smoking about 0.50 packs per day. She has never used smokeless tobacco. She reports current alcohol use of about 6.0 standard drinks of alcohol per week. She reports that she does not use drugs.  family history includes Liver cancer in her maternal grandmother and mother; Lung cancer in her father and mother.  Allergies  Allergen Reactions  . Hydromorphone Hcl Nausea And Vomiting  . Aspirin Nausea Only       PHYSICAL EXAMINATION: Vital signs: BP 92/60   Pulse 60   Temp 97.8 F (36.6 C)   Ht 5\' 3"  (1.6 m)   Wt 99 lb 9.6 oz (45.2 kg)   LMP 04/20/2013   BMI 17.64 kg/m   Constitutional: Thin but generally well-appearing, no acute distress Psychiatric: alert and oriented x3, cooperative Eyes: extraocular movements intact, anicteric, conjunctiva pink Mouth: oral pharynx moist, no lesions Neck: supple no lymphadenopathy Cardiovascular: heart regular rate and rhythm, no murmur Lungs: clear to auscultation bilaterally Abdomen: soft, nontender, nondistended, no obvious ascites, no peritoneal signs, normal bowel sounds, no organomegaly Rectal: Omitted Extremities: no clubbing, cyanosis, or lower extremity edema bilaterally  Skin: no lesions on visible extremities Neuro: No focal deficits. No asterixis.   ASSESSMENT:  1.  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  Currently compensated.  Meld score 14.  Being followed by transplant center 2.  Probable small HCC being monitored by IR 3.  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  Mild portal gastropathy.  September 2020 4.  GERD.  Stable on PPI 5.  Chronic  alcoholism.  She has been abstinent in recent months 6.  Recent diagnosis of breast cancer.  For lumpectomy November 17   PLAN:  1.  Continue PPI.  Medication refilled 2.  Continue diuretics every other day.  Medications refilled 3.  Ongoing follow-up with other specialist 4.  Routine GI follow-up in this clinic in 6 months.  Contact the office in the interim for any questions or problems 5.  Relook EGD 1 to 2 years 6.  Screening colonoscopy due next year pending overall clinical status

## 2019-09-27 NOTE — Telephone Encounter (Signed)
Oral Oncology Pharmacist Encounter  I spoke with pharmacist at [facility] this morning. Doptelet prescription instructions updated for patient to take 40mg  with food for 5 days, 11/7-11/11.  Prescription is ready to be scheduled. They plan to reach out to the patient today.  Johny Drilling, PharmD, BCPS, BCOP 09/27/2019 8:07 AM Oral Oncology Clinic [phone]

## 2019-09-28 ENCOUNTER — Telehealth: Payer: Self-pay

## 2019-09-28 NOTE — Telephone Encounter (Signed)
No entry 

## 2019-09-30 HISTORY — PX: BREAST BIOPSY: SHX20

## 2019-09-30 HISTORY — PX: BREAST LUMPECTOMY: SHX2

## 2019-10-01 NOTE — Telephone Encounter (Signed)
Oral Oncology Patient Advocate Encounter  Confirmed with [facility] that the [facility] will deliver to the patient today, 11/2 with a $16.66 copay.  [place] Patient [place] Phone [phone] Fax [phone] 10/01/2019   10:11 AM

## 2019-10-04 ENCOUNTER — Telehealth: Payer: Self-pay

## 2019-10-04 NOTE — Telephone Encounter (Signed)
Spoke with patient to let her know that we have faxed a correction letter to [facility] for her husband's FMLA request.  This was sent to fax [phone]

## 2019-10-11 ENCOUNTER — Encounter (HOSPITAL_COMMUNITY): Payer: Self-pay

## 2019-10-11 ENCOUNTER — Encounter (HOSPITAL_COMMUNITY)
Admission: RE | Admit: 2019-10-11 | Discharge: 2019-10-11 | Disposition: A | Discharge status: Home / Self Care | Payer: BC Managed Care – PPO | Source: Ambulatory Visit | Attending: General Surgery | Admitting: General Surgery

## 2019-10-11 ENCOUNTER — Other Ambulatory Visit: Payer: Self-pay

## 2019-10-11 DIAGNOSIS — Z853 Personal history of malignant neoplasm of breast: Secondary | ICD-10-CM | POA: Insufficient documentation

## 2019-10-11 DIAGNOSIS — J439 Emphysema, unspecified: Secondary | ICD-10-CM | POA: Insufficient documentation

## 2019-10-11 DIAGNOSIS — Z01812 Encounter for preprocedural laboratory examination: Secondary | ICD-10-CM | POA: Insufficient documentation

## 2019-10-11 DIAGNOSIS — Z79899 Other long term (current) drug therapy: Secondary | ICD-10-CM | POA: Diagnosis not present

## 2019-10-11 DIAGNOSIS — Z79811 Long term (current) use of aromatase inhibitors: Secondary | ICD-10-CM | POA: Insufficient documentation

## 2019-10-11 DIAGNOSIS — K766 Portal hypertension: Secondary | ICD-10-CM | POA: Diagnosis not present

## 2019-10-11 DIAGNOSIS — Z87891 Personal history of nicotine dependence: Secondary | ICD-10-CM | POA: Diagnosis not present

## 2019-10-11 DIAGNOSIS — E739 Lactose intolerance, unspecified: Secondary | ICD-10-CM | POA: Diagnosis not present

## 2019-10-11 HISTORY — DX: Headache, unspecified: R51.9

## 2019-10-11 HISTORY — DX: Liver cell carcinoma: C22.0

## 2019-10-11 HISTORY — DX: Chronic obstructive pulmonary disease, unspecified: J44.9

## 2019-10-11 LAB — CBC WITH DIFFERENTIAL/PLATELET
Abs Immature Granulocytes: 0.08 10*3/uL — ABNORMAL HIGH (ref 0.00–0.07)
Basophils Absolute: 0.1 10*3/uL (ref 0.0–0.1)
Basophils Relative: 1 %
Eosinophils Absolute: 0.1 10*3/uL (ref 0.0–0.5)
Eosinophils Relative: 2 %
HCT: 40.2 % (ref 36.0–46.0)
Hemoglobin: 13.3 g/dL (ref 12.0–15.0)
Immature Granulocytes: 2 %
Lymphocytes Relative: 22 %
Lymphs Abs: 1.1 10*3/uL (ref 0.7–4.0)
MCH: 32.2 pg (ref 26.0–34.0)
MCHC: 33.1 g/dL (ref 30.0–36.0)
MCV: 97.3 fL (ref 80.0–100.0)
Monocytes Absolute: 0.5 10*3/uL (ref 0.1–1.0)
Monocytes Relative: 10 %
Neutro Abs: 3.3 10*3/uL (ref 1.7–7.7)
Neutrophils Relative %: 63 %
Platelets: DECREASED 10*3/uL (ref 150–400)
RBC: 4.13 MIL/uL (ref 3.87–5.11)
RDW: 14.8 % (ref 11.5–15.5)
WBC: 5.2 10*3/uL (ref 4.0–10.5)
nRBC: 0 % (ref 0.0–0.2)

## 2019-10-11 LAB — PROTIME-INR
INR: 1.4 — ABNORMAL HIGH (ref 0.8–1.2)
Prothrombin Time: 17.1 seconds — ABNORMAL HIGH (ref 11.4–15.2)

## 2019-10-11 LAB — COMPREHENSIVE METABOLIC PANEL
ALT: 24 U/L (ref 0–44)
AST: 37 U/L (ref 15–41)
Albumin: 3 g/dL — ABNORMAL LOW (ref 3.5–5.0)
Alkaline Phosphatase: 108 U/L (ref 38–126)
Anion gap: 6 (ref 5–15)
BUN: 6 mg/dL (ref 6–20)
CO2: 24 mmol/L (ref 22–32)
Calcium: 8.6 mg/dL — ABNORMAL LOW (ref 8.9–10.3)
Chloride: 108 mmol/L (ref 98–111)
Creatinine, Ser: 0.76 mg/dL (ref 0.44–1.00)
GFR calc Af Amer: >60 mL/min (ref >60)
GFR calc non Af Amer: >60 mL/min (ref >60)
Glucose, Bld: 96 mg/dL (ref 70–99)
Potassium: 4 mmol/L (ref 3.5–5.1)
Sodium: 138 mmol/L (ref 135–145)
Total Bilirubin: 1.3 mg/dL — ABNORMAL HIGH (ref 0.3–1.2)
Total Protein: 7 g/dL (ref 6.5–8.1)

## 2019-10-11 LAB — TYPE AND SCREEN
ABO/RH(D): A POS
Antibody Screen: NEGATIVE

## 2019-10-11 LAB — ABO/RH: ABO/RH(D): A POS

## 2019-10-11 NOTE — Pre-Procedure Instructions (Signed)
Theresa Rocha  10/11/2019      [id number] - [place], [place] [address] [place] [address] Phone: [phone] Fax: [phone]  [place], [place] [address] Phone: [phone] Fax: [place], [place] [address] Phone: [phone] Fax: [phone]    Your procedure is scheduled on Nov.17  Report to [facility] Entrance A at 5:30 A.M.  Call this number if you have problems the morning of surgery:  [phone]   Remember:  Do not eat or drink after midnight.  You may drink clear liquids until 4:30 .  Clear liquids allowed are:                    Water, Juice (non-citric and without pulp), Carbonated beverages, Clear Tea, Black Coffee only, Plain Jell-O only, Gatorade and Plain Popsicles only    Take these medicines the morning of surgery with A SIP OF WATER :             Anastrozole (arimidex)            zofran if needed            Pantoprazole (protonix)              7 days prior to surgery STOP taking any Aspirin (unless otherwise instructed by your surgeon), Aleve, Naproxen, Ibuprofen, Motrin, Advil, Goody's, BC's, all herbal medications, fish oil, and all vitamins.    Do not wear jewelry, make-up or nail polish.  Do not wear lotions, powders, or perfumes, or deodorant.  Do not shave 48 hours prior to surgery.  Men may shave face and neck.  Do not bring valuables to the hospital.  [facility] is not responsible for any belongings or valuables.  Contacts, dentures or bridgework may not be worn into surgery.  Leave your suitcase in the car.  After surgery it may be brought to your room.  For patients admitted to the hospital, discharge time will be determined by your treatment team.  Patients discharged the day of surgery will not be allowed to  drive home.    Special instructions:  [facility]- Preparing For Surgery  Before surgery, you can play an important role. Because skin is not sterile, your skin needs to be as free of germs as possible. You can reduce the number of germs on your skin by washing with CHG (chlorahexidine gluconate) Soap before surgery.  CHG is an antiseptic cleaner which kills germs and bonds with the skin to continue killing germs even after washing.    Oral Hygiene is also important to reduce your risk of infection.  Remember - BRUSH YOUR TEETH THE MORNING OF SURGERY WITH YOUR REGULAR TOOTHPASTE  Please do not use if you have an allergy to CHG or antibacterial soaps. If your skin becomes reddened/irritated stop using the CHG.  Do not shave (including legs and underarms) for at least 48 hours prior to first CHG shower. It is OK to shave your face.  Please follow these instructions carefully.   1. Shower the NIGHT BEFORE SURGERY and the MORNING OF SURGERY with CHG.   2. If you chose to wash your hair, wash your hair first as usual with your normal shampoo.  3. After you shampoo, rinse your hair and body thoroughly to remove the shampoo.  4. Use CHG as you would any other liquid soap. You can apply CHG directly to the skin and wash gently with a scrungie or a clean washcloth.   5. Apply the CHG Soap to your body ONLY FROM THE NECK DOWN.  Do not use on open wounds or open sores. Avoid contact with your eyes, ears, mouth and genitals (private parts). Wash Face and genitals (private parts)  with your normal soap.  6. Wash thoroughly, paying special attention to the area where your surgery will be performed.  7. Thoroughly rinse your body with warm water from the neck down.  8. DO NOT shower/wash with your normal soap after using and rinsing off the CHG Soap.  9. Pat yourself dry with a CLEAN TOWEL.  10. Wear CLEAN PAJAMAS to bed the night before surgery, wear comfortable clothes the morning of  surgery  11. Place CLEAN SHEETS on your bed the night of your first shower and DO NOT SLEEP WITH PETS.    Day of Surgery:  Do not apply any deodorants/lotions.  Please wear clean clothes to the hospital/surgery center.   Remember to brush your teeth WITH YOUR REGULAR TOOTHPASTE.    Please read over the following fact sheets that you were given. Coughing and Deep Breathing and Surgical Site Infection Prevention

## 2019-10-11 NOTE — Progress Notes (Signed)
PCP - Seward Carol @ [facility] Cardiologist - na Oncologist: Burr Medico GI: Henrene Pastor Liver Specialist: Charlaine Dalton  Chest x-ray - na EKG - na Stress Test - na ECHO - na Cardiac Cath - na  Sleep Study - na CPAP -   Fasting Blood Sugar - na Checks Blood Sugar _____ times a day  Blood Thinner Instructions:na Aspirin Instructions:  ERAS Protcol -yes PRE-SURGERY Ensure or G2- none  COVID TEST- 11/13   Anesthesia review: History, low platelets  Patient denies shortness of breath, fever, cough and chest pain at PAT appointment   All instructions explained to the patient, with a verbal understanding of the material. Patient agrees to go over the instructions while at home for a better understanding. Patient also instructed to self quarantine after being tested for COVID-19. The opportunity to ask questions was provided.

## 2019-10-11 NOTE — Progress Notes (Signed)
Notified Michelle @ Dr. Marlowe Aschoff office of abnormal pt/ptt and platelet count stating "platelet clumps noted on smear, count appears decreased" and immature platelet fraction may be indicated, consider ordering this additional test [id number].

## 2019-10-11 NOTE — Progress Notes (Addendum)
Anesthesia Chart Review:  Case: [id number] Date/Time: 10/16/19 0715   Procedure: RIGHT BREAST LUMPECTOMY WITH SENTINEL  LYMPH NODE BIOPSY (Right Breast)   Anesthesia type: General   Pre-op diagnosis: right breast cancer   Location: [facility] OR ROOM 04 / T[facility] OR   Surgeon: Stark Klein, MD      DISCUSSION: Patient is a 53 year old female scheduled for the above procedure. 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   History includes smoking, right breast cancer (08/29/19 biopsy: invasive ductal carcinoma), hepatic cirrhosis (with portal hypertension, esophageal varices, thrombocytopenia), hepatocellular carcinoma (06/2019), Hepatitis C (s/p treatment 2018), COPD, GERD, IBS, Raynaud's syndrome. Sober since 06/2019.  Patient with known cirrhosis believed due to ETOH and hepatitis C. On 07/04/19, she developed episode of rectal bleeding and was seen in the ED. Hemoccult negative and HGB stable. Imaging showed ascites. Had attempted paracentesis 07/10/19, but insufficient ascites and started on diuretic therapy instead. MRI of the liver 07/26/19 showed two hepatic lesions felt consistent with hepatocellular carcinoma. Patient seen by GI, oncology, hepatology, and general surgery. [facility] records (where liver transplant evaluation initiated) indicate that per [facility] Tumor Board, she was not felt to be a candidate for systemic therapy and also not felt to be a candidate for liver resection given presence of ascites and portal hypertension She was referred to IR Aletta Edouard, MD for consideration for liver ablation,niether lesion could be visualized/reproduced by [place] due to their small size, so follow-up MRI in November recommended and on-going management of new palpable right breast mass (that has since been confirmed as breast cancer).  Last seen at [facility] on 09/11/19 by Roosevelt Locks, NP with plans to follow-up ~ 2-3 weeks after breast surgery. She discussed increased  surgical risks in regards to underlying liver disease. Patient is no longer a candidate for liver transplant given her breast cancer diagnosis.   Preoperative labs consistent with her known liver disease.  Total bilirubin 1.3, INR 1.4, PT 17.1. Platelet clumps noted on smear with count appearing decreased (previously PLT count 46-69K since 01/17/17 in [facility]). AST/ALT and H/H were WNL.   - COVID-19 test is in process. PAT RN contacted Dr. Marlowe Aschoff staff regarding abnormal lab values. Per April at [facility], Dr. Barry Dienes wants a repeat CBC on the morning of surgery. Dr. Barry Dienes had already ordered a T&S and also prepare pheresed platelets with a standing order for 2 units (12-20 packs) of if PLT count < 50,000. Anesthesiologist and surgeon to follow-up day of surgery CBC results.   VS: BP 105/64   Pulse 71   Temp 36.8 C (Oral)   Resp 18   Ht 5\' 3"  (1.6 m)   Wt 45.9 kg   LMP 04/20/2013   SpO2 98%   BMI 17.94 kg/m    PROVIDERS: Seward Carol, MD is PCP Samson Frederic, MD is Hepatologist. Last seen by Roosevelt Locks, NP on 09/11/19 ([facility], Note scanned under Media Tab).  Scarlette Shorts, MD is GI  Donald Prose, MD is ID Truitt Merle, MD is HEM-ONC   LABS: Preoperative labs noted. See DISCUSSION. Repeat CBC on the day of surgery.  (all labs ordered are listed, but only abnormal results are displayed)  Labs Reviewed  CBC WITH DIFFERENTIAL/PLATELET - Abnormal; Notable for the following components:      Result Value   Abs Immature Granulocytes 0.08 (*)    All other components within normal limits  COMPREHENSIVE METABOLIC PANEL -  Abnormal; Notable for the following components:   Calcium 8.6 (*)    Albumin 3.0 (*)    Total Bilirubin 1.3 (*)    All other components within normal limits  PROTIME-INR - Abnormal; Notable for the following components:   Prothrombin Time 17.1 (*)    INR 1.4 (*)    All other components within normal limits  TYPE AND SCREEN   ABO/RH  PREPARE PLATELET PHERESIS    OTHER:  According to 09/11/19 Liver Care notes: "She had an EGD on 08/09/2019 which reported grade 1 esophageal varices portal hypertensive gastropathy and no gastric varices.  She has no history of variceal hemorrhage or GI bleeding."   IMAGES: CT Chest 08/07/19: IMPRESSION: 1. Tiny bilateral pulmonary nodules measuring up to 6 mm. These are too small to definitively characterize. Given the history of HCC, close follow-up recommended to ensure stability. 2.  Emphysema. [id number])   EKG: N/A   CV: N/A   Past Medical History:  Diagnosis Date  . Breast cancer ([place]) 08/30/2019  . Cancer [facility])    liver- just diagnosed, not started treatment yet  . Cervicalgia   . COPD (chronic obstructive pulmonary disease) ([place])   . GERD (gastroesophageal reflux disease)   . Headache   . Hepatic cirrhosis ([place])   . Hepatitis C    resolved 2018  . Hepatocellular carcinoma ([place])   . IBS (irritable bowel syndrome)   . Lactose intolerance   . Portal hypertension ([place])   . Raynaud's syndrome   . Thrombocytopenia ([place])     Past Surgical History:  Procedure Laterality Date  . COLONOSCOPY    . HAMMER TOE SURGERY Left 2012   pins placed in great toe, 2nd,3rd, 4th toes, all pins removed except great toe  . IR RADIOLOGIST EVAL & MGMT  08/16/2019  . PELVIC LAPAROSCOPY Bilateral 1992  . UPPER GASTROINTESTINAL ENDOSCOPY  2002   had esophageal dilitation    MEDICATIONS: . anastrozole (ARIMIDEX) 1 MG tablet  . Avatrombopag Maleate (DOPTELET) 20 MG TABS  . furosemide (LASIX) 20 MG tablet  . loperamide (IMODIUM) 2 MG capsule  . ondansetron (ZOFRAN) 8 MG tablet  . pantoprazole (PROTONIX) 40 MG tablet  . Potassium Chloride ER 20 MEQ TBCR  . spironolactone (ALDACTONE) 50 MG tablet   No current facility-administered medications for this encounter.     Myra Gianotti, PA-C Surgical Short Stay/Anesthesiology [facility] Phone [phone] [facility] Phone [phone] 10/12/2019 5:53 PM

## 2019-10-12 ENCOUNTER — Other Ambulatory Visit (HOSPITAL_COMMUNITY)
Admission: RE | Admit: 2019-10-12 | Discharge: 2019-10-12 | Disposition: A | Discharge status: Home / Self Care | Payer: BC Managed Care – PPO | Source: Ambulatory Visit | Attending: General Surgery | Admitting: General Surgery

## 2019-10-12 DIAGNOSIS — Z01812 Encounter for preprocedural laboratory examination: Secondary | ICD-10-CM | POA: Diagnosis not present

## 2019-10-12 DIAGNOSIS — Z20828 Contact with and (suspected) exposure to other viral communicable diseases: Secondary | ICD-10-CM | POA: Diagnosis not present

## 2019-10-12 NOTE — Anesthesia Preprocedure Evaluation (Addendum)
Anesthesia Evaluation  Patient identified by MRN, date of birth, ID band Patient awake    Reviewed: Allergy & Precautions, NPO status , Patient's Chart, lab work & pertinent test results  Airway Mallampati: II  TM Distance: >3 FB Neck ROM: Full    Dental  (+) Edentulous Upper, Edentulous Lower   Pulmonary COPD, Current SmokerPatient did not abstain from smoking.,    Pulmonary exam normal breath sounds clear to auscultation       Cardiovascular negative cardio ROS Normal cardiovascular exam Rhythm:Regular Rate:Normal     Neuro/Psych  Headaches, negative psych ROS   GI/Hepatic GERD  ,(+) Cirrhosis     substance abuse  alcohol use, Hepatitis -, C  Endo/Other  negative endocrine ROS  Renal/GU negative Renal ROS  negative genitourinary   Musculoskeletal negative musculoskeletal ROS (+)   Abdominal   Peds negative pediatric ROS (+)  Hematology negative hematology ROS (+)   Anesthesia Other Findings Breast Cancer  Reproductive/Obstetrics negative OB ROS                           Anesthesia Physical Anesthesia Plan  ASA: III  Anesthesia Plan: General   Post-op Pain Management:  Regional for Post-op pain   Induction: Intravenous  PONV Risk Score and Plan: 2 and Ondansetron, Midazolam and Treatment may vary due to age or medical condition  Airway Management Planned: LMA and Oral ETT  Additional Equipment:   Intra-op Plan:   Post-operative Plan: Extubation in OR  Informed Consent: I have reviewed the patients History and Physical, chart, labs and discussed the procedure including the risks, benefits and alternatives for the proposed anesthesia with the patient or authorized representative who has indicated his/her understanding and acceptance.     Dental advisory given  Plan Discussed with: CRNA  Anesthesia Plan Comments: (See PAT note written 10/12/2019 by Myra Gianotti, PA-C.  Known cirrhosis with recent diagnosis of liver and breast cancer. PLT count 46-69K since 12/2016. Clumped on 10/11/19 CBC, so plan for STAT CBC on arrival. Dr. Barry Dienes already ordered T&S and has order for 2 Unit phereses platelets if count <= 50K.  )       Anesthesia Quick Evaluation

## 2019-10-14 LAB — NOVEL CORONAVIRUS, NAA (HOSP ORDER, SEND-OUT TO REF LAB; TAT 18-24 HRS): SARS-CoV-2, NAA: NOT DETECTED

## 2019-10-15 MED FILL — ANASTROZOLE 1 MG TABLET: 1 | 30 days supply | Qty: 30 | Fill #1

## 2019-10-15 NOTE — H&P (Signed)
Theresa Rocha Location: [facility] Patient #: [id number] DOB: Jul 03, 1966 Married / Language: English / Race: White Female   History of Present Illness The patient is a 53 year old female who presents with breast cancer. Pt is being worked up for transplant for cirrhosis and [place]. She had mammogram as part of workup. This was positive for a 2.5 cm mass at 12 o'clock. She is referred back for consultation by Dr. Autumn Patty for this new diagnosis of breast cancer. Biopsy showed invasive ductal carcinoma, ER/PR +, her 2 negative, Ki 67 15%. axilla was negative. Mass is palpable. She hasn't had a mammogram in at least 5 years.     dx mammogram 08/29/2019 CLINICAL DATA: 53 year old female with a palpable right breast mass. The patient has known hepatitis C and cirrhosis and presumed hepatocellular carcinoma and is awaiting transplant.  EXAM: DIGITAL DIAGNOSTIC BILATERAL MAMMOGRAM WITH CAD AND TOMO  ULTRASOUND RIGHT BREAST  COMPARISON: Previous exam(s).  ACR Breast Density Category c: The breast tissue is heterogeneously dense, which may obscure small masses.  FINDINGS: No suspicious mass or malignant type microcalcifications identified in the left breast.  In the upper slightly outer quadrant of the right breast there is a spiculated 2.5 cm mass. Several tiny punctate calcifications are seen in the mass. No additional mass is seen in the right breast.  Mammographic images were processed with CAD.  On physical exam, I palpate a discrete mass in the right breast at 12 o'clock 3 cm from the nipple.  Targeted ultrasound is performed, showing a lobulated hypoechoic mass in the right breast at 12 o'clock 3 cm from the nipple measuring 3.2 x 1.6 x 2.6 cm. Sonographic evaluation of the right axilla does not show any enlarged adenopathy  IMPRESSION: Suspicious mass in the 12 o'clock region of the right breast.  RECOMMENDATION: Ultrasound-guided core biopsy of the  mass in the 12 o'clock region of the right breast is recommended. The biopsy will be scheduled the patient's convenience.  I have discussed the findings and recommendations with the patient. If applicable, a reminder letter will be sent to the patient regarding the next appointment.  BI-RADS CATEGORY 5: Highly suggestive of malignancy.  pathology 08/29/19 Diagnosis Breast, right, needle core biopsy, 12 o'clock - INVASIVE DUCTAL CARCINOMA, SEE COMMENT. The carcinoma appears grade 2 and measures 13 mm in greatest linear extent. IMMUNOHISTOCHEMICAL AND MORPHOMETRIC ANALYSIS PERFORMED MANUALLY The tumor cells are NEGATIVE for Her2 (1+). Estrogen Receptor: 100%, POSITIVE, STRONG STAINING INTENSITY Progesterone Receptor: 90%, POSITIVE, STRONG STAINING INTENSITY Proliferation Marker Ki67: 15%   Allergies Aspirin 81 *ANALGESICS - NonNarcotic*  Dolophine *ANALGESICS - OPIOID*  Allergies Reconciled   Medication History Spironolactone ('50MG'$  Tablet, Oral) Active. Potassium Chloride ER (20MEQ Tablet ER, Oral) Active. Pantoprazole Sodium ('40MG'$  Tablet DR, Oral) Active. Ondansetron HCl ('8MG'$  Tablet, Oral) Active. Furosemide ('20MG'$  Tablet, Oral) Active. Loperamide HCl ('2MG'$  Tablet, Oral) Active. Medications Reconciled    Review of Systems  All other systems negative  Vitals  Weight: 100.5 lb Height: 63in Body Surface Area: 1.44 m Body Mass Index: 17.8 kg/m  Temp.: 98.97F(Temporal)  Pulse: 97 (Regular)  BP: 118/64 (Sitting, Left Arm, Standard)       Physical Exam General Mental Status-Alert. General Appearance-Consistent with stated age. Hydration-Well hydrated. Voice-Normal.  Head and Neck Head-normocephalic, atraumatic with no lesions or palpable masses.  Eye Sclera/Conjunctiva - Bilateral-No scleral icterus.  Chest and Lung Exam Chest and lung exam reveals -quiet, even and easy respiratory effort with no use of accessory  muscles. Inspection Chest Wall - Normal. Back - normal.  Breast Note: 3 cm palpable mass at 12 o'clock on right. breasts are relatively dense bilaterally. slight contour change on right upper breast. no nipple retraction or skin dimpling. mass is mobile. no LAD. nothing palpable on left.   Cardiovascular Cardiovascular examination reveals -normal pedal pulses bilaterally. Note: regular rate and rhythm  Abdomen Inspection-Inspection Normal. Palpation/Percussion Palpation and Percussion of the abdomen reveal - Soft, Non Tender, No Rebound tenderness, No Rigidity (guarding) and No hepatosplenomegaly.  Peripheral Vascular Upper Extremity Inspection - Bilateral - Normal - No Clubbing, No Cyanosis, No Edema, Pulses Intact. Lower Extremity Palpation - Edema - Bilateral - No edema - Bilateral.  Neurologic Neurologic evaluation reveals -alert and oriented x 3 with no impairment of recent or remote memory. Mental Status-Normal.  Musculoskeletal Global Assessment -Note: no gross deformities.  Normal Exam - Left-Upper Extremity Strength Normal and Lower Extremity Strength Normal. Normal Exam - Right-Upper Extremity Strength Normal and Lower Extremity Strength Normal.  Lymphatic Head & Neck  General Head & Neck Lymphatics: Bilateral - Description - Normal. Axillary  General Axillary Region: Bilateral - Description - Normal. Tenderness - Non Tender.    Assessment & Plan  MALIGNANT NEOPLASM OF UPPER-OUTER QUADRANT OF RIGHT BREAST IN FEMALE, ESTROGEN RECEPTOR POSITIVE (C50.411) Impression: This is a new cT2N0 right breast cancer, hormone positive. She is good candidate for lumpectomy. Will plan no seed since mass is palpable. Nipple is too small for periareolar incision. The surgical procedure was described to the patient. I discussed the incision type and location and that we would need radiology involved on with a wire or seed marker and/or sentinel node.  The risks  and benefits of the procedure were described to the patient and she wishes to proceed.  We discussed the risks bleeding, infection, damage to other structures, need for further procedures/surgeries. We discussed the risk of seroma. The patient was advised if the area in the breast in cancer, we may need to go back to surgery for additional tissue to obtain negative margins or for a lymph node biopsy. The patient was advised that these are the most common complications, but that others can occur as well. They were advised against taking aspirin or other anti-inflammatory agents/blood thinners the week before surgery.  Advised her to discuss with [facility] how this will affect her transplant status. Current Plans You are being scheduled for surgery- Our schedulers will call you.  You should hear from our office's scheduling department within 5 working days about the location, date, and time of surgery. We try to make accommodations for patient's preferences in scheduling surgery, but sometimes the OR schedule or the surgeon's schedule prevents [place] from making those accommodations.  If you have not heard from our office [phone]) in 5 working days, call the office and ask for your surgeon's nurse.  If you have other questions about your diagnosis, plan, or surgery, call the office and ask for your surgeon's nurse.  Pt Education - flb breast cancer surgery: discussed with patient and provided information.

## 2019-10-16 ENCOUNTER — Encounter (HOSPITAL_COMMUNITY)
Admission: RE | Disposition: A | Discharge status: Home / Self Care | Payer: Self-pay | Source: Ambulatory Visit | Attending: General Surgery

## 2019-10-16 ENCOUNTER — Ambulatory Visit (HOSPITAL_COMMUNITY): Payer: BC Managed Care – PPO | Admitting: Vascular Surgery

## 2019-10-16 ENCOUNTER — Encounter (HOSPITAL_COMMUNITY): Payer: Self-pay

## 2019-10-16 ENCOUNTER — Other Ambulatory Visit: Payer: Self-pay

## 2019-10-16 ENCOUNTER — Ambulatory Visit (HOSPITAL_COMMUNITY)
Admission: RE | Admit: 2019-10-16 | Discharge: 2019-10-16 | Disposition: A | Discharge status: Home / Self Care | Payer: BC Managed Care – PPO | Source: Ambulatory Visit | Attending: General Surgery | Admitting: General Surgery

## 2019-10-16 ENCOUNTER — Encounter (HOSPITAL_COMMUNITY)
Admission: RE | Admit: 2019-10-16 | Discharge: 2019-10-16 | Disposition: A | Discharge status: Home / Self Care | Payer: BC Managed Care – PPO | Source: Ambulatory Visit | Attending: General Surgery | Admitting: General Surgery

## 2019-10-16 DIAGNOSIS — Z170 Estrogen receptor positive status [ER+]: Secondary | ICD-10-CM | POA: Insufficient documentation

## 2019-10-16 DIAGNOSIS — G8918 Other acute postprocedural pain: Secondary | ICD-10-CM | POA: Diagnosis not present

## 2019-10-16 DIAGNOSIS — J449 Chronic obstructive pulmonary disease, unspecified: Secondary | ICD-10-CM | POA: Diagnosis not present

## 2019-10-16 DIAGNOSIS — C50911 Malignant neoplasm of unspecified site of right female breast: Secondary | ICD-10-CM | POA: Diagnosis not present

## 2019-10-16 DIAGNOSIS — C50411 Malignant neoplasm of upper-outer quadrant of right female breast: Secondary | ICD-10-CM | POA: Insufficient documentation

## 2019-10-16 DIAGNOSIS — Z79899 Other long term (current) drug therapy: Secondary | ICD-10-CM | POA: Insufficient documentation

## 2019-10-16 DIAGNOSIS — K7011 Alcoholic hepatitis with ascites: Secondary | ICD-10-CM | POA: Diagnosis not present

## 2019-10-16 DIAGNOSIS — B182 Chronic viral hepatitis C: Secondary | ICD-10-CM | POA: Diagnosis not present

## 2019-10-16 HISTORY — PX: BREAST LUMPECTOMY WITH AXILLARY LYMPH NODE BIOPSY: SHX5593

## 2019-10-16 LAB — CBC
HCT: 36.8 % (ref 36.0–46.0)
Hemoglobin: 12 g/dL (ref 12.0–15.0)
MCH: 32 pg (ref 26.0–34.0)
MCHC: 32.6 g/dL (ref 30.0–36.0)
MCV: 98.1 fL (ref 80.0–100.0)
Platelets: DECREASED 10*3/uL (ref 150–400)
RBC: 3.75 MIL/uL — ABNORMAL LOW (ref 3.87–5.11)
RDW: 14.9 % (ref 11.5–15.5)
WBC: 6.2 10*3/uL (ref 4.0–10.5)
nRBC: 0 % (ref 0.0–0.2)

## 2019-10-16 SURGERY — BREAST LUMPECTOMY WITH AXILLARY LYMPH NODE BIOPSY
Anesthesia: General | Site: Breast | Laterality: Right

## 2019-10-16 MED ORDER — PROPOFOL 10 MG/ML IV BOLUS
INTRAVENOUS | Status: DC | PRN
  Administered 2019-10-16: 200 mg via INTRAVENOUS

## 2019-10-16 MED ORDER — BUPIVACAINE-EPINEPHRINE 0.5% -1:200000 IJ SOLN
INTRAMUSCULAR | Status: AC
  Filled 2019-10-16: qty 1

## 2019-10-16 MED ORDER — LIDOCAINE-EPINEPHRINE 1 %-1:100000 IJ SOLN
INTRAMUSCULAR | Status: DC | PRN
  Administered 2019-10-16: 8 mL

## 2019-10-16 MED ORDER — MEPERIDINE HCL 25 MG/ML IJ SOLN: 6.2500 mg | INTRAMUSCULAR | Status: DC | PRN

## 2019-10-16 MED ORDER — LIDOCAINE HCL 1 % IJ SOLN
INTRAMUSCULAR | Status: AC
  Filled 2019-10-16: qty 20

## 2019-10-16 MED ORDER — OXYCODONE HCL 5 MG/5ML PO SOLN: 5.0000 mg | Freq: Once | ORAL | Status: AC | PRN

## 2019-10-16 MED ORDER — DEXAMETHASONE SODIUM PHOSPHATE 10 MG/ML IJ SOLN
INTRAMUSCULAR | Status: DC | PRN
  Administered 2019-10-16: 4 mg via INTRAVENOUS

## 2019-10-16 MED ORDER — PROPOFOL 10 MG/ML IV BOLUS
INTRAVENOUS | Status: AC
  Filled 2019-10-16: qty 20

## 2019-10-16 MED ORDER — PROMETHAZINE HCL 12.5 MG PO TABS: 12.5000 mg | ORAL_TABLET | Freq: Four times a day (QID) | ORAL | 0 refills | Status: DC | PRN

## 2019-10-16 MED ORDER — FENTANYL CITRATE (PF) 250 MCG/5ML IJ SOLN
INTRAMUSCULAR | Status: AC
  Filled 2019-10-16: qty 5

## 2019-10-16 MED ORDER — MIDAZOLAM HCL 2 MG/2ML IJ SOLN
INTRAMUSCULAR | Status: DC | PRN
  Administered 2019-10-16 (×2): 1 mg via INTRAVENOUS

## 2019-10-16 MED ORDER — LIDOCAINE 2% (20 MG/ML) 5 ML SYRINGE
INTRAMUSCULAR | Status: DC | PRN
  Administered 2019-10-16: 60 mg via INTRAVENOUS

## 2019-10-16 MED ORDER — FENTANYL CITRATE (PF) 100 MCG/2ML IJ SOLN
25.0000 ug | INTRAMUSCULAR | Status: DC | PRN
  Administered 2019-10-16: 10:00:00 50 ug via INTRAVENOUS

## 2019-10-16 MED ORDER — LIDOCAINE-EPINEPHRINE 1 %-1:100000 IJ SOLN
INTRAMUSCULAR | Status: AC
  Filled 2019-10-16: qty 1

## 2019-10-16 MED ORDER — LACTATED RINGERS IV SOLN
INTRAVENOUS | Status: DC | PRN
  Administered 2019-10-16 (×2): via INTRAVENOUS

## 2019-10-16 MED ORDER — OXYCODONE HCL 5 MG PO TABS
5.0000 mg | ORAL_TABLET | Freq: Once | ORAL | Status: AC | PRN
  Administered 2019-10-16: 5 mg via ORAL

## 2019-10-16 MED ORDER — CEFAZOLIN SODIUM-DEXTROSE 2-4 GM/100ML-% IV SOLN
2.0000 g | INTRAVENOUS | Status: AC
  Administered 2019-10-16: 2 g via INTRAVENOUS
  Filled 2019-10-16: qty 100

## 2019-10-16 MED ORDER — SODIUM CHLORIDE (PF) 0.9 % IJ SOLN
INTRAMUSCULAR | Status: AC
  Filled 2019-10-16: qty 10

## 2019-10-16 MED ORDER — ONDANSETRON HCL 4 MG/2ML IJ SOLN
INTRAMUSCULAR | Status: DC | PRN
  Administered 2019-10-16: 4 mg via INTRAVENOUS

## 2019-10-16 MED ORDER — ACETAMINOPHEN 500 MG PO TABS
1000.0000 mg | ORAL_TABLET | ORAL | Status: AC
  Administered 2019-10-16: 1000 mg via ORAL
  Filled 2019-10-16: qty 2

## 2019-10-16 MED ORDER — CHLORHEXIDINE GLUCONATE CLOTH 2 % EX PADS: 6.0000 | MEDICATED_PAD | Freq: Once | CUTANEOUS | Status: DC

## 2019-10-16 MED ORDER — OXYCODONE HCL 5 MG PO TABS
ORAL_TABLET | ORAL | Status: AC
  Filled 2019-10-16: qty 1

## 2019-10-16 MED ORDER — OXYCODONE HCL 5 MG PO TABS: 5.0000 mg | ORAL_TABLET | Freq: Four times a day (QID) | ORAL | 0 refills | Status: DC | PRN

## 2019-10-16 MED ORDER — FENTANYL CITRATE (PF) 100 MCG/2ML IJ SOLN
INTRAMUSCULAR | Status: AC
  Filled 2019-10-16: qty 2

## 2019-10-16 MED ORDER — TECHNETIUM TC 99M SULFUR COLLOID FILTERED
1.0000 | Freq: Once | INTRAVENOUS | Status: AC | PRN
  Administered 2019-10-16: 1 via INTRADERMAL

## 2019-10-16 MED ORDER — EPHEDRINE SULFATE 50 MG/ML IJ SOLN
INTRAMUSCULAR | Status: DC | PRN
  Administered 2019-10-16 (×2): 5 mg via INTRAVENOUS

## 2019-10-16 MED ORDER — SODIUM CHLORIDE (PF) 0.9 % IJ SOLN
INTRAVENOUS | Status: DC | PRN
  Administered 2019-10-16: 5 mL via INTRAMUSCULAR

## 2019-10-16 MED ORDER — 0.9 % SODIUM CHLORIDE (POUR BTL) OPTIME
TOPICAL | Status: DC | PRN
  Administered 2019-10-16: 1000 mL

## 2019-10-16 MED ORDER — ROPIVACAINE HCL 5 MG/ML IJ SOLN
INTRAMUSCULAR | Status: DC | PRN
  Administered 2019-10-16: 30 mL via PERINEURAL

## 2019-10-16 MED ORDER — FENTANYL CITRATE (PF) 250 MCG/5ML IJ SOLN
INTRAMUSCULAR | Status: DC | PRN
  Administered 2019-10-16 (×2): 50 ug via INTRAVENOUS
  Administered 2019-10-16: 25 ug via INTRAVENOUS

## 2019-10-16 MED ORDER — MIDAZOLAM HCL 2 MG/2ML IJ SOLN
INTRAMUSCULAR | Status: AC
  Filled 2019-10-16: qty 2

## 2019-10-16 MED ORDER — PHENYLEPHRINE HCL (PRESSORS) 10 MG/ML IV SOLN
INTRAVENOUS | Status: DC | PRN
  Administered 2019-10-16: 120 ug via INTRAVENOUS
  Administered 2019-10-16 (×2): 80 ug via INTRAVENOUS
  Administered 2019-10-16: 120 ug via INTRAVENOUS
  Administered 2019-10-16: 160 ug via INTRAVENOUS
  Administered 2019-10-16: 120 ug via INTRAVENOUS

## 2019-10-16 MED ORDER — ONDANSETRON HCL 4 MG/2ML IJ SOLN
INTRAMUSCULAR | Status: AC
  Filled 2019-10-16: qty 2

## 2019-10-16 MED ORDER — METHYLENE BLUE 0.5 % INJ SOLN
INTRAVENOUS | Status: AC
  Filled 2019-10-16: qty 10

## 2019-10-16 MED ORDER — PROMETHAZINE HCL 25 MG/ML IJ SOLN: 6.2500 mg | INTRAMUSCULAR | Status: DC | PRN

## 2019-10-16 SURGICAL SUPPLY — 54 items
BINDER BREAST LRG (GAUZE/BANDAGES/DRESSINGS) IMPLANT
BINDER BREAST MEDIUM (GAUZE/BANDAGES/DRESSINGS) ×2 IMPLANT
BINDER BREAST XLRG (GAUZE/BANDAGES/DRESSINGS) IMPLANT
BLADE SURG 10 STRL SS (BLADE) ×2 IMPLANT
BNDG GAUZE ELAST 4 BULKY (GAUZE/BANDAGES/DRESSINGS) IMPLANT
CANISTER SUCT 3000ML PPV (MISCELLANEOUS) ×2 IMPLANT
CHLORAPREP W/TINT 26 (MISCELLANEOUS) ×2 IMPLANT
CLIP VESOCCLUDE LG 6/CT (CLIP) ×2 IMPLANT
CLIP VESOCCLUDE MED 24/CT (CLIP) ×2 IMPLANT
CLIP VESOCCLUDE MED 6/CT (CLIP) ×2 IMPLANT
CLIP VESOCCLUDE SM WIDE 6/CT (CLIP) ×2 IMPLANT
CONT SPEC STER OR (MISCELLANEOUS) ×4 IMPLANT
COVER MAYO STAND STRL (DRAPES) ×2 IMPLANT
COVER PROBE W GEL 5X96 (DRAPES) ×2 IMPLANT
COVER SURGICAL LIGHT HANDLE (MISCELLANEOUS) ×2 IMPLANT
COVER WAND RF STERILE (DRAPES) IMPLANT
DERMABOND ADVANCED (GAUZE/BANDAGES/DRESSINGS) ×1
DERMABOND ADVANCED .7 DNX12 (GAUZE/BANDAGES/DRESSINGS) ×1 IMPLANT
DRSG PAD ABDOMINAL 8X10 ST (GAUZE/BANDAGES/DRESSINGS) ×2 IMPLANT
ELECT REM PT RETURN 9FT ADLT (ELECTROSURGICAL) ×2
ELECTRODE REM PT RTRN 9FT ADLT (ELECTROSURGICAL) ×1 IMPLANT
GAUZE SPONGE 4X4 12PLY STRL (GAUZE/BANDAGES/DRESSINGS) ×2 IMPLANT
GAUZE SPONGE 4X4 12PLY STRL LF (GAUZE/BANDAGES/DRESSINGS) IMPLANT
GLOVE BIO SURGEON STRL SZ 6 (GLOVE) ×2 IMPLANT
GLOVE BIOGEL PI IND STRL 7.0 (GLOVE) ×1 IMPLANT
GLOVE BIOGEL PI INDICATOR 7.0 (GLOVE) ×1
GLOVE INDICATOR 6.5 STRL GRN (GLOVE) ×2 IMPLANT
GOWN STRL REUS W/ TWL LRG LVL3 (GOWN DISPOSABLE) ×2 IMPLANT
GOWN STRL REUS W/TWL 2XL LVL3 (GOWN DISPOSABLE) ×2 IMPLANT
GOWN STRL REUS W/TWL LRG LVL3 (GOWN DISPOSABLE) ×2
ILLUMINATOR WAVEGUIDE N/F (MISCELLANEOUS) IMPLANT
KIT BASIN OR (CUSTOM PROCEDURE TRAY) ×2 IMPLANT
KIT MARKER MARGIN INK (KITS) ×2 IMPLANT
LIGHT WAVEGUIDE WIDE FLAT (MISCELLANEOUS) IMPLANT
NEEDLE 18GX1X1/2 (RX/OR ONLY) (NEEDLE) ×2 IMPLANT
NEEDLE FILTER BLUNT 18X 1/2SAF (NEEDLE) ×1
NEEDLE FILTER BLUNT 18X1 1/2 (NEEDLE) ×1 IMPLANT
NEEDLE HYPO 25GX1X1/2 BEV (NEEDLE) ×2 IMPLANT
NS IRRIG 1000ML POUR BTL (IV SOLUTION) ×2 IMPLANT
PACK GENERAL/GYN (CUSTOM PROCEDURE TRAY) ×2 IMPLANT
PACK UNIVERSAL I (CUSTOM PROCEDURE TRAY) ×2 IMPLANT
PENCIL SMOKE EVACUATOR (MISCELLANEOUS) ×2 IMPLANT
STOCKINETTE IMPERVIOUS 9X36 MD (GAUZE/BANDAGES/DRESSINGS) ×2 IMPLANT
STRIP CLOSURE SKIN 1/2X4 (GAUZE/BANDAGES/DRESSINGS) IMPLANT
SUT MON AB 4-0 PC3 18 (SUTURE) ×4 IMPLANT
SUT SILK 2 0 SH (SUTURE) IMPLANT
SUT VIC AB 2-0 SH 27 (SUTURE) ×1
SUT VIC AB 2-0 SH 27XBRD (SUTURE) ×1 IMPLANT
SUT VIC AB 3-0 SH 27 (SUTURE) ×1
SUT VIC AB 3-0 SH 27X BRD (SUTURE) ×1 IMPLANT
SYR 5ML LL (SYRINGE) ×2 IMPLANT
SYR CONTROL 10ML LL (SYRINGE) ×4 IMPLANT
TOWEL GREEN STERILE (TOWEL DISPOSABLE) ×2 IMPLANT
TOWEL GREEN STERILE FF (TOWEL DISPOSABLE) IMPLANT

## 2019-10-16 NOTE — Interval H&P Note (Signed)
History and Physical Interval Note:  10/16/2019 7:26 AM  Theresa Rocha  has presented today for surgery, with the diagnosis of right breast cancer.  The various methods of treatment have been discussed with the patient and family. After consideration of risks, benefits and other options for treatment, the patient has consented to  Procedure(s): RIGHT BREAST LUMPECTOMY WITH SENTINEL LYMPH NODE BIOPSY (Right) as a surgical intervention.  The patient's history has been reviewed, patient examined, no change in status, stable for surgery.  I have reviewed the patient's chart and labs.  Questions were answered to the patient's satisfaction.     Stark Klein

## 2019-10-16 NOTE — Anesthesia Procedure Notes (Signed)
Procedure Name: LMA Insertion Date/Time: 10/16/2019 7:55 AM Performed by: Bryson Corona, CRNA Pre-anesthesia Checklist: Patient identified, Emergency Drugs available, Suction available and Patient being monitored Patient Re-evaluated:Patient Re-evaluated prior to induction Oxygen Delivery Method: Circle System Utilized Preoxygenation: Pre-oxygenation with 100% oxygen Induction Type: IV induction Ventilation: Mask ventilation without difficulty LMA: LMA inserted LMA Size: 4.0 Number of attempts: 1 Placement Confirmation: positive ETCO2 Tube secured with: Tape Dental Injury: Teeth and Oropharynx as per pre-operative assessment

## 2019-10-16 NOTE — Op Note (Signed)
Right Breast Lumpectomy with Sentinel Node Mapping and Biopsy  Indications: This patient presents with history of right breast cancer with clinically negative axillary lymph node exam.  Pre-operative Diagnosis: right breast cancer, cT2N0, UOQ, +/+/-  Post-operative Diagnosis: right breast cancer, same as above  Surgeon: Stark Klein, MD FACS   Assistants: Judyann Munson, RNFA  Anesthesia: General LMA anesthesia and Local anesthesia 1% plain lidocaine, 0.25.% bupivacaine, with epinephrine  ASA Class: 3  Procedure Details  The patient was seen in the Holding Room. The risks, benefits, complications, treatment options, and expected outcomes were discussed with the patient. The possibilities of reaction to medication, pulmonary aspiration, bleeding, infection, the need for additional procedures, failure to diagnose a condition, and creating a complication requiring transfusion or operation were discussed with the patient. The patient concurred with the proposed plan, giving informed consent.  The site of surgery properly noted/marked. The patient was taken to Operating Room # 2, identified as Theresa Rocha and the procedure verified as Right Breast Lumpectomy and Sentinel Node Biopsy.  After induction of anesthesia, the right arm, breast, and chest were prepped and draped in standard fashion.   A Time Out was held and the above information confirmed.  Methylene blue was injected into the subareolar location.    The lumpectomy was performed by creating a transverse incision over the superior  breast.  A section of skin was removed where the skin was tethered.  Skin hooks were used to assist with creating skin flaps along with the cautery.  Dissection was carried down to the pectoral fascia.  The specimen was marked with the margin marker paint kit.  The lumpectomy specimen was checked in the faxatron and the biopsy clip was in the center.  Hemostasis was achieved with cautery.  Clips were  placed on all sides of the lumpectomy site.  There was a significant defect superiorly at the lumpectomy site.  The cautery was used to free up the underlying breast tissue.  2-0 vicryl mastopexy sutures were used to help close down the defect.   The wound was irrigated and closed with a 3-0 Vicryl deep dermal interrupted and a 4-0 Monocryl subcuticular closure in layers.   Using a hand-held gamma probe, axillary sentinel nodes were identified transcutaneously.  An oblique incision was created below the axillary hairline.  Dissection was carried through the clavipectoral fascia.  Two deep level 2 axillary sentinel nodes were removed and submitted to pathology. Counts per second were 190 and 58.  The background count was 8. The axillary incision was closed with 3-0 vicryl deep dermal interrupted sutures and 4-0 monocryl subcuticular closure in layers.   Sterile dressings were applied. At the end of the operation, all sponge, instrument, and needle counts were correct.  Findings: grossly clear surgical margins, anterior margin is skin and posterior margin is pectoralis.  Pec fascia was taken with the specimen.    Estimated Blood Loss:  Minimal         Drains: none         Specimens: right breast lumpectomy and two right sentinel lymph nodes.                  Complications:  None; patient tolerated the procedure well.         Disposition: PACU - hemodynamically stable.         Condition: stable

## 2019-10-16 NOTE — Anesthesia Procedure Notes (Signed)
Anesthesia Regional Block: Pectoralis block   Pre-Anesthetic Checklist: ,, timeout performed, Correct Patient, Correct Site, Correct Laterality, Correct Procedure, Correct Position, site marked, Risks and benefits discussed,  Surgical consent,  Pre-op evaluation,  At surgeon's request and post-op pain management  Laterality: Right  Prep: chloraprep       Needles:  Injection technique: Single-shot  Needle Type: Stimiplex     Needle Length: 9cm  Needle Gauge: 21     Additional Needles:   Procedures:,,,, ultrasound used (permanent image in chart),,,,  Narrative:  Start time: 10/16/2019 7:21 AM End time: 10/16/2019 7:26 AM Injection made incrementally with aspirations every 5 mL.  Performed by: Personally  Anesthesiologist: Lynda Rainwater, MD

## 2019-10-16 NOTE — Discharge Instructions (Addendum)
[facility] °Office Phone Number [phone] ° °BREAST BIOPSY/ PARTIAL MASTECTOMY: POST OP INSTRUCTIONS ° °Always review your discharge instruction sheet given to you by the facility where your surgery was performed. ° °IF YOU HAVE DISABILITY OR FAMILY LEAVE FORMS, YOU MUST BRING THEM TO THE OFFICE FOR PROCESSING.  DO NOT GIVE THEM TO YOUR DOCTOR. ° °1. A prescription for pain medication may be given to you upon discharge.  Take your pain medication as prescribed, if needed.  If narcotic pain medicine is not needed, then you may take acetaminophen (Tylenol) or ibuprofen (Advil) as needed. °2. Take your usually prescribed medications unless otherwise directed °3. If you need a refill on your pain medication, please contact your pharmacy.  They will contact our office to request authorization.  Prescriptions will not be filled after 5pm or on week-ends. °4. You should eat very light the first 24 hours after surgery, such as soup, crackers, pudding, etc.  Resume your normal diet the day after surgery. °5. Most patients will experience some swelling and bruising in the breast.  Ice packs and a good support bra will help.  Swelling and bruising can take several days to resolve.  °6. It is common to experience some constipation if taking pain medication after surgery.  Increasing fluid intake and taking a stool softener will usually help or prevent this problem from occurring.  A mild laxative (Milk of Magnesia or Miralax) should be taken according to package directions if there are no bowel movements after 48 hours. °7. Unless discharge instructions indicate otherwise, you may remove your bandages 48 hours after surgery, and you may shower at that time.  You may have steri-strips (small skin tapes) in place directly over the incision.  These strips should be left on the skin for 7-10 days.   Any sutures or staples will be removed at the office during your follow-up visit. °8. ACTIVITIES:  You may resume  regular daily activities (gradually increasing) beginning the next day.  Wearing a good support bra or sports bra (or the breast binder) minimizes pain and swelling.  You may have sexual intercourse when it is comfortable. °a. You may drive when you no longer are taking prescription pain medication, you can comfortably wear a seatbelt, and you can safely maneuver your car and apply brakes. °b. RETURN TO WORK:  __________1 week_______________ °9. You should see your doctor in the office for a follow-up appointment approximately two weeks after your surgery.  Your doctor’s nurse will typically make your follow-up appointment when she calls you with your pathology report.  Expect your pathology report 2-3 business days after your surgery.  You may call to check if you do not hear from us after three days. ° ° °WHEN TO CALL YOUR DOCTOR: °1. Fever over 101.0 °2. Nausea and/or vomiting. °3. Extreme swelling or bruising. °4. Continued bleeding from incision. °5. Increased pain, redness, or drainage from the incision. ° °The clinic staff is available to answer your questions during regular business hours.  Please don’t hesitate to call and ask to speak to one of the nurses for clinical concerns.  If you have a medical emergency, go to the nearest emergency room or call 911.  A surgeon from [facility] is always on call at the hospital. ° °For further questions, please visit [email or web address]  ° °

## 2019-10-16 NOTE — Anesthesia Postprocedure Evaluation (Signed)
Anesthesia Post Note  Patient: Theresa Rocha  Procedure(s) Performed: RIGHT BREAST LUMPECTOMY WITH SENTINEL LYMPH NODE BIOPSY (Right Breast)     Patient location during evaluation: PACU Anesthesia Type: General Level of consciousness: awake and alert Pain management: pain level controlled Vital Signs Assessment: post-procedure vital signs reviewed and stable Respiratory status: spontaneous breathing, nonlabored ventilation and respiratory function stable Cardiovascular status: blood pressure returned to baseline and stable Postop Assessment: no apparent nausea or vomiting Anesthetic complications: no    Last Vitals:  Vitals:   10/16/19 1021 10/16/19 1038  BP: 123/85 118/87  Pulse: 92 84  Resp: 17 15  Temp: 36.4 C   SpO2: 94% 97%    Last Pain:  Vitals:   10/16/19 1038  TempSrc:   PainSc: [place]

## 2019-10-16 NOTE — Transfer of Care (Signed)
Immediate Anesthesia Transfer of Care Note  Patient: Theresa Rocha  Procedure(s) Performed: RIGHT BREAST LUMPECTOMY WITH SENTINEL LYMPH NODE BIOPSY (Right Breast)  Patient Location: PACU  Anesthesia Type:General  Level of Consciousness: awake, alert  and oriented  Airway & Oxygen Therapy: Patient Spontanous Breathing and Patient connected to face mask oxygen  Post-op Assessment: Report given to RN and Post -op Vital signs reviewed and stable  Post vital signs: Reviewed and stable  Last Vitals:  Vitals Value Taken Time  BP 131/86 10/16/19 0949  Temp    Pulse 92 10/16/19 0951  Resp 18 10/16/19 0951  SpO2 97 % 10/16/19 0951  Vitals shown include unvalidated device data.  Last Pain:  Vitals:   10/16/19 0626  TempSrc: Oral  PainSc:          Complications: No apparent anesthesia complications

## 2019-10-16 NOTE — Progress Notes (Signed)
Notified Addison Lank., CRNA about patient's decreased platelet count, who notified Dr. Sabra Heck. Per patient, this is chronic. No new orders per anesthesia team.

## 2019-10-17 ENCOUNTER — Encounter (HOSPITAL_COMMUNITY): Payer: Self-pay | Admitting: General Surgery

## 2019-10-18 LAB — SURGICAL PATHOLOGY

## 2019-10-18 NOTE — Progress Notes (Signed)
Please let patient know margins and lymph nodes are negative.

## 2019-10-19 ENCOUNTER — Telehealth: Payer: Self-pay | Admitting: *Deleted

## 2019-10-19 NOTE — Telephone Encounter (Signed)
Ordered oncotype per Dr. Feng.  Faxed requisition to pathology.   

## 2019-10-23 ENCOUNTER — Telehealth: Payer: Self-pay

## 2019-10-23 NOTE — Telephone Encounter (Signed)
Faxed signed order back to [facility] at [phone] received confirmation fax went through, sent to HIM for scanning to chart.

## 2019-10-31 ENCOUNTER — Other Ambulatory Visit: Payer: Self-pay | Admitting: *Deleted

## 2019-10-31 ENCOUNTER — Telehealth: Payer: Self-pay | Admitting: *Deleted

## 2019-10-31 DIAGNOSIS — C50111 Malignant neoplasm of central portion of right female breast: Secondary | ICD-10-CM

## 2019-10-31 NOTE — Progress Notes (Signed)
amb  

## 2019-10-31 NOTE — Telephone Encounter (Signed)
Received oncotype results of 21/8%. Unable to leave voicemail.  No answer.

## 2019-11-01 ENCOUNTER — Other Ambulatory Visit: Payer: BC Managed Care – PPO

## 2019-11-01 ENCOUNTER — Ambulatory Visit: Payer: BC Managed Care – PPO | Admitting: Hematology

## 2019-11-01 ENCOUNTER — Telehealth: Payer: Self-pay | Admitting: Internal Medicine

## 2019-11-06 NOTE — Telephone Encounter (Signed)
The labs Dr. Henrene Pastor ordered were from October.  Patient does not appear to have had any labs drawn at [facility]

## 2019-11-08 ENCOUNTER — Encounter: Payer: Self-pay | Admitting: Hematology

## 2019-11-09 ENCOUNTER — Telehealth: Payer: Self-pay | Admitting: Internal Medicine

## 2019-11-09 ENCOUNTER — Other Ambulatory Visit: Payer: Self-pay

## 2019-11-09 DIAGNOSIS — R197 Diarrhea, unspecified: Secondary | ICD-10-CM

## 2019-11-09 DIAGNOSIS — K7031 Alcoholic cirrhosis of liver with ascites: Secondary | ICD-10-CM

## 2019-11-09 DIAGNOSIS — E876 Hypokalemia: Secondary | ICD-10-CM

## 2019-11-09 LAB — CBC WITH DIFFERENTIAL/PLATELET
Abs Immature Granulocytes: 0.01 10*3/uL (ref 0.00–0.07)
Basophils Absolute: 0.1 10*3/uL (ref 0.0–0.1)
Basophils Relative: 1 %
Eosinophils Absolute: 0.1 10*3/uL (ref 0.0–0.5)
Eosinophils Relative: 2 %
HCT: 36 % (ref 36.0–46.0)
Hemoglobin: 11.9 g/dL — ABNORMAL LOW (ref 12.0–15.0)
Immature Granulocytes: 0 %
Lymphocytes Relative: 23 %
Lymphs Abs: 1.2 10*3/uL (ref 0.7–4.0)
MCH: 32.6 pg (ref 26.0–34.0)
MCHC: 33.1 g/dL (ref 30.0–36.0)
MCV: 98.6 fL (ref 80.0–100.0)
Monocytes Absolute: 0.7 10*3/uL (ref 0.1–1.0)
Monocytes Relative: 14 %
Neutro Abs: 3.3 10*3/uL (ref 1.7–7.7)
Neutrophils Relative %: 60 %
Platelets: 85 10*3/uL — ABNORMAL LOW (ref 150–400)
RBC: 3.65 MIL/uL — ABNORMAL LOW (ref 3.87–5.11)
RDW: 15.1 % (ref 11.5–15.5)
WBC: 5.4 10*3/uL (ref 4.0–10.5)
nRBC: 0 % (ref 0.0–0.2)

## 2019-11-09 LAB — COMPREHENSIVE METABOLIC PANEL
ALT: 18 U/L (ref 0–44)
AST: 31 U/L (ref 15–41)
Albumin: 2.7 g/dL — ABNORMAL LOW (ref 3.5–5.0)
Alkaline Phosphatase: 83 U/L (ref 38–126)
Anion gap: 7 (ref 5–15)
BUN: 8 mg/dL (ref 6–20)
CO2: 27 mmol/L (ref 22–32)
Calcium: 8.1 mg/dL — ABNORMAL LOW (ref 8.9–10.3)
Chloride: 105 mmol/L (ref 98–111)
Creatinine, Ser: 0.95 mg/dL (ref 0.44–1.00)
GFR calc Af Amer: >60 mL/min (ref >60)
GFR calc non Af Amer: >60 mL/min (ref >60)
Glucose, Bld: 84 mg/dL (ref 70–99)
Potassium: 3.1 mmol/L — ABNORMAL LOW (ref 3.5–5.1)
Sodium: 139 mmol/L (ref 135–145)
Total Bilirubin: 1 mg/dL (ref 0.3–1.2)
Total Protein: 6.2 g/dL — ABNORMAL LOW (ref 6.5–8.1)

## 2019-11-09 LAB — PROTIME-INR
INR: 1.3 — ABNORMAL HIGH (ref 0.8–1.2)
Prothrombin Time: 16.5 seconds — ABNORMAL HIGH (ref 11.4–15.2)

## 2019-11-09 MED ORDER — POTASSIUM CHLORIDE CRYS ER 20 MEQ PO TBCR: 20.0000 meq | EXTENDED_RELEASE_TABLET | Freq: Every day | ORAL | 0 refills | Status: DC

## 2019-11-09 MED ORDER — FUROSEMIDE 40 MG PO TABS: 40.0000 mg | ORAL_TABLET | Freq: Every day | ORAL | 3 refills | Status: DC

## 2019-11-09 MED ORDER — SPIRONOLACTONE 100 MG PO TABS: 100.0000 mg | ORAL_TABLET | Freq: Every day | ORAL | 3 refills | Status: DC

## 2019-11-09 NOTE — Telephone Encounter (Signed)
1.  Increase Aldactone to 100 mg daily 2.  Increase Lasix to 40 mg daily 3.  Low-sodium diet 4.  Check stool for C. difficile now 5.  Check CBC, comprehensive metabolic panel, PT/INR now 6.  Have her follow-up with me in 2 weeks

## 2019-11-09 NOTE — Telephone Encounter (Signed)
Pt had a surgery two weeks ago, she states that since then the medications that Dr. Henrene Pastor had her on are not working and she has been having gi issues. Pls call her.

## 2019-11-09 NOTE — Telephone Encounter (Signed)
Spoke with pt and she is aware. 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 New scripts sent to pharmacy. Pt scheduled to see Dr. Henrene Pastor 11/21/19@11am .

## 2019-11-09 NOTE — Telephone Encounter (Signed)
1.  Moved my 11 AM appointment (ES) that day to some other time with an APP 2.  Put Theresa Rocha in that spot Thanks

## 2019-11-09 NOTE — Telephone Encounter (Signed)
Pt had lumpectomy 3 weeks ago. Reports since surgery the diuretics she is on do not seem to be working. States her feet and ankles are swollen and her abd is quite distended. Also reports she has been having watery diarrhea now for over a week with bad abdominal cramping. She is currently taking lasix 20mg  daily and aldactone 50mg  daily. Please advise.

## 2019-11-09 NOTE — Telephone Encounter (Signed)
There are no available appts until 12/14/19. ?Overbook on 12/23? Please advise.

## 2019-11-12 NOTE — Telephone Encounter (Signed)
Patient has been scheduled for paracentesis tomorrow at [facility] 12:45 arrival for 1:00 tomorrow. She has been notified

## 2019-11-12 NOTE — Telephone Encounter (Signed)
Patient stopped into the office today to show me her abdomen.  She c/o large amount of ascites and it has gotten larger, despite the increase in diuretics on Friday.  She did increase to furosemide 40 and spironolactone 100.  She states she is very uncomfortable in any position.

## 2019-11-12 NOTE — Telephone Encounter (Signed)
Okay then.  Set her up for ultrasound-guided paracentesis.  Up to 5 L.  IV albumin replacement.  Send fluid for cell count with differential.  Thanks

## 2019-11-12 NOTE — Addendum Note (Signed)
Addended by: Marlon Pel on: 11/12/2019 03:56 PM   Modules accepted: Orders

## 2019-11-13 ENCOUNTER — Other Ambulatory Visit: Payer: BC Managed Care – PPO

## 2019-11-13 ENCOUNTER — Ambulatory Visit (HOSPITAL_COMMUNITY)
Admission: RE | Admit: 2019-11-13 | Discharge: 2019-11-13 | Disposition: A | Discharge status: Home / Self Care | Payer: BC Managed Care – PPO | Source: Ambulatory Visit | Attending: Internal Medicine | Admitting: Internal Medicine

## 2019-11-13 ENCOUNTER — Other Ambulatory Visit: Payer: Self-pay

## 2019-11-13 DIAGNOSIS — K7031 Alcoholic cirrhosis of liver with ascites: Secondary | ICD-10-CM | POA: Diagnosis present

## 2019-11-13 DIAGNOSIS — R188 Other ascites: Secondary | ICD-10-CM | POA: Diagnosis not present

## 2019-11-13 DIAGNOSIS — R197 Diarrhea, unspecified: Secondary | ICD-10-CM

## 2019-11-13 HISTORY — PX: IR PARACENTESIS: IMG2679

## 2019-11-13 LAB — BODY FLUID CELL COUNT WITH DIFFERENTIAL
Eos, Fluid: 0 %
Lymphs, Fluid: 55 %
Monocyte-Macrophage-Serous Fluid: 28 % — ABNORMAL LOW (ref 50–90)
Neutrophil Count, Fluid: 17 % (ref 0–25)
Total Nucleated Cell Count, Fluid: 131 cu mm (ref 0–1000)

## 2019-11-13 MED ORDER — LIDOCAINE HCL 1 % IJ SOLN
INTRAMUSCULAR | Status: DC | PRN
  Administered 2019-11-13: 10 mL

## 2019-11-13 MED ORDER — LIDOCAINE HCL 1 % IJ SOLN
INTRAMUSCULAR | Status: AC
  Filled 2019-11-13: qty 20

## 2019-11-13 NOTE — Procedures (Signed)
   US guided LLQ paracentesis  2.9 L clear yellow fluid Sent for labs  Tolerated well

## 2019-11-14 ENCOUNTER — Telehealth: Payer: Self-pay | Admitting: Internal Medicine

## 2019-11-14 NOTE — Telephone Encounter (Signed)
Pt inquired about results of stool sample and paracentesis.

## 2019-11-14 NOTE — Telephone Encounter (Signed)
Spoke with pt and let her know the lab results are not back yet, let her know we will call her when they are back.

## 2019-11-14 NOTE — Progress Notes (Signed)
Location of Breast Cancer: Right Breast  Histology per Pathology Report:  08/29/19 Diagnosis Breast, right, needle core biopsy, 12 o'clock - INVASIVE DUCTAL CARCINOMA, SEE COMMENT.  Receptor Status: ER(100%), PR (90%), Her2-neu (NEG), Ki-(15%)  10/16/19 FINAL MICROSCOPIC DIAGNOSIS: A. BREAST, RIGHT, LUMPECTOMY: - Invasive ductal carcinoma, 2.3 cm, Nottingham grade 2 of 3. - Margins of resection are not involved (Closest margin: 1 mm, anterior). - See oncology table. B. SENTINEL LYMPH NODE, RIGHT AXILLARY #1, BIOPSY: - One lymph node, negative for carcinoma (0/1). C. SENTINEL LYMPH NODE, RIGHT AXILLARY #2, BIOPSY: - One lymph node, negative for carcinoma (0/1).  Did patient present with symptoms or was this found on screening mammography?: She had a mammogram as part of work up for a liver transplant due to cirrhosis and [place]. She does report that she felt the mass before the mammogram.   Past/Anticipated interventions by surgeon, if any: 10/16/19 Dr. Barry Dienes Right Breast Lumpectomy with Sentinel Node Mapping and Biopsy  Past/Anticipated interventions by medical oncology, if any: 09/06/19 [name] PLAN:  -Proceed with breast surgery soon  -Oncotype on breast surgical sample  -F/u after surgery or radiation  -Copy note to [facility], Dr Barry Dienes and Dr Kathlene Cote.   Lymphedema issues, if any:  She denies.  She does report generalized swelling. She noted extremity swelling and abdominal swelling. She did have a paracentesis performed with of 2.5 Liters of fluid removed on 11/13/19.  Pain issues, if any:  She reports pain to her abdomen related to her paracentesis.   SAFETY ISSUES:  Prior radiation? No  Pacemaker/ICD? No  Possible current pregnancy? No, she is not having periods.   Is the patient on methotrexate? No  Current Complaints / other details:   Multifocal Hepatocellular carcinoma, cT2N0Mx, stage II -She initially presented with multifocal lesions of liver  consistent with [place] with indeterminate right lobe lesions. Her AFP is in normal range.  -Given her history of liver disease and imaging findings, her diagnosis of HCC is quite certain.Her case was reviewed in GI tumor board and a biopsy was not felt to be needed to confirm the diagnosis.She has what appears to be early stage disease, no evidence of distant metastasis --She was seen by liver clinic Dawn Drazak to discuss liver transplant.She started workup and screening for transplant when she was diagnosed with right breast cancer.  Unfortunately the breast cancer diagnosis and treatment will likely put her liver transplant on hold. She will discuss with Dawn further  -She was seen by Dr. Barry Dienes and Dr. Kathlene Cote, unfortunately she was felt to be not a good candidate for surgical resection of liver targeted ablation at this point due to her liver function and location of tumors.  Dr. Kathlene Cote recommended close follow-up -I discussed her breast cancer with Dr. Barry Dienes and Dr. Kathlene Cote and agreed to wait on Liver treatment decisions until we treat her breast cancer. She is agreeable.      Ardon Franklin, Stephani Police, RN 11/14/2019,10:37 AM

## 2019-11-15 LAB — PATHOLOGIST SMEAR REVIEW

## 2019-11-15 NOTE — Progress Notes (Signed)
Radiation Oncology         [phone] ________________________________  Initial outpatient Consultation by telephone as patient was unable to access MyChart video during pandemic precautions   Name: Theresa Rocha MRN: [id number]  Date: 11/16/2019  DOB: 1966-01-01  [id number], Jori Moll, MD  Truitt Merle, MD   REFERRING PHYSICIAN: Truitt Merle, MD  DIAGNOSIS:    ICD-10-CM   1. Malignant neoplasm of central portion of right breast in female, estrogen receptor positive ([place])  C50.111    Z17.0     Cancer Staging Malignant neoplasm of central portion of right breast in female, estrogen receptor positive ([place]) Staging form: Breast, AJCC 8th Edition - Clinical stage from 08/29/2019: Stage IB (cT2, cN0, cM0, G2, ER+, PR+, HER2-) - Signed by Truitt Merle, MD on 09/05/2019 - Pathologic stage from 10/16/2019: Stage IA (pT2, pN0, cM0, G2, ER+, PR+, HER2-, Oncotype DX score: 21) - Signed by Gardenia Phlegm, NP on 11/07/2019  CHIEF COMPLAINT: Here to discuss management of right breast cancer  HISTORY OF PRESENT ILLNESS::Theresa Rocha is a 53 y.o. female who presented with breast abnormality on the following imaging: diagnostic mammography on the date of 08/29/2019.  Symptoms, if any, at that time, were: palpable right breast mass.   Ultrasound of breast on 08/29/2019 revealed suspicious 3.2 cm mass in the right breast at 12 o'clock.   Biopsy on date of 08/29/2019 showed invasive ductal carcinoma.  ER status: positive; PR status positive, Her2 status negative; Grade 2.  MRI breasts 09-13-2019 demonstrated an irregular spiculated mass in the right breast with no abnormal right axillary or right internal mammary lymph nodes.  There was a prominent left internal mammary node that was indeterminate.  The consensus at tumor board is that this is unlikely to be related to her right breast cancer and this will be followed in the future by [name].  No evidence of malignancy in the left breast..  She has  known indeterminate pulmonary nodules from the CT scan of her chest that was previously done in September, cannot rule out metastases from known hepatocellular carcinoma.  She proceeded to right lumpectomy with sentinel lymph node biopsy on 10/16/2019 with pathology report revealing: tumor size of 2.3 cm; histology of ductal carcinoma; margin status to invasive disease of >1 mm; nodal status of negative (0/2); Grade 2. Prognostic panel was not repeated.  Oncotype DX was obtained on the final surgical sample and the recurrence score of 21 predicts a risk of recurrence outside the breast over the next 9 years of 7%, if the patient's only systemic therapy is an antiestrogen for 5 years.  It also predicts no benefit from chemotherapy.  She is also coping with HCC of the liver. Following with [name] and Dr. Kathlene Cote for this. The consensus is to complete procedures for her breast cancer before considering further procedures to treat the [facility] in 2021.  Lymphedema issues, if any:  She denies.  She does report generalized swelling. She noted extremity swelling and abdominal swelling. She did have a paracentesis performed with of 2.5 Liters of fluid removed on 11/13/19.  Pain issues, if any:  She reports pain to her abdomen related to her paracentesis.   SAFETY ISSUES:  Prior radiation? No  Pacemaker/ICD? No  Possible current pregnancy? No, she is not having periods.   Is the patient on methotrexate? 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), Cervicalgia, COPD (chronic obstructive pulmonary disease) ([place]), GERD (gastroesophageal reflux disease), Headache, Hepatic cirrhosis ([place]), Hepatitis C, Hepatocellular carcinoma ([place]), IBS (irritable bowel syndrome), Lactose intolerance, Portal hypertension ([place]), Raynaud's syndrome, and Thrombocytopenia  ([place]).    PAST SURGICAL HISTORY: Past Surgical History:  Procedure Laterality Date  . BREAST LUMPECTOMY WITH AXILLARY LYMPH NODE BIOPSY Right 10/16/2019   Procedure: RIGHT BREAST LUMPECTOMY WITH SENTINEL LYMPH NODE BIOPSY;  Surgeon: Stark Klein, MD;  Location: [place];  Service: General;  Laterality: Right;  . COLONOSCOPY    . HAMMER TOE SURGERY Left 2012   pins placed in great toe, 2nd,3rd, 4th toes, all pins removed except great toe  . IR PARACENTESIS  11/13/2019  . IR RADIOLOGIST EVAL & MGMT  08/16/2019  . PELVIC LAPAROSCOPY Bilateral 1992  . UPPER GASTROINTESTINAL ENDOSCOPY  2002   had esophageal dilitation    FAMILY HISTORY: family history includes Liver cancer in her maternal grandmother and mother; Lung cancer in her father and mother.  SOCIAL HISTORY:  reports that she has been smoking cigarettes. She started smoking about 40 years ago. She has a 15.00 pack-year smoking history. She has never used smokeless tobacco. She reports previous alcohol use of about 6.0 standard drinks of alcohol per week. She reports that she does not use drugs.  ALLERGIES: Hydromorphone hcl and Aspirin  MEDICATIONS:  Current Outpatient Medications  Medication Sig Dispense Refill  . anastrozole (ARIMIDEX) 1 MG tablet Take 1 tablet (1 mg total) by mouth daily. 30 tablet 2  . furosemide (LASIX) 40 MG tablet Take 1 tablet (40 mg total) by mouth daily. 30 tablet 3  . loperamide (IMODIUM) 2 MG capsule Take 2 mg by mouth daily as needed for diarrhea or loose stools.     . ondansetron (ZOFRAN) 8 MG tablet Take 1 tablet (8 mg total) by mouth every 8 (eight) hours as needed for nausea or vomiting. 20 tablet 0  . pantoprazole (PROTONIX) 40 MG tablet Take 1 tablet (40 mg total) by mouth daily. 30 tablet 6  . potassium chloride SA (KLOR-CON) 20 MEQ tablet Take 1 tablet (20 mEq total) by mouth daily. 30 tablet 0  . spironolactone (ALDACTONE) 100 MG tablet Take 1 tablet (100 mg total) by mouth daily. 30 tablet 3  .  Avatrombopag Maleate (DOPTELET) 20 MG TABS Take 40 mg by mouth daily. Take daily with food for 5 days. Start 10 days prior to surgery 11/7 - 11/12 then stop. (Patient not taking: Reported on 11/16/2019) 10 tablet 0  . oxyCODONE (OXY IR/ROXICODONE) 5 MG immediate release tablet Take 1 tablet (5 mg total) by mouth every 6 (six) hours as needed for severe pain. (Patient not taking: Reported on 11/16/2019) 15 tablet 0  . promethazine (PHENERGAN) 12.5 MG tablet Take 1 tablet (12.5 mg total) by mouth every 6 (six) hours as needed for nausea or vomiting. (Patient not taking: Reported on 11/16/2019) 20 tablet 0   No current facility-administered medications for this encounter.    REVIEW OF SYSTEMS: As above  PHYSICAL EXAM:  vitals were not taken for this visit.   General: Alert and oriented, in no acute distress   LABORATORY DATA:  Lab Results  Component Value Date   WBC 5.4 11/09/2019   HGB 11.9 (L) 11/09/2019   HCT 36.0 11/09/2019   MCV 98.6 11/09/2019   PLT 85 (L) 11/09/2019   CMP     Component Value Date/Time   NA 139  11/09/2019 1107   K 3.1 (L) 11/09/2019 1107   CL 105 11/09/2019 1107   CO2 27 11/09/2019 1107   GLUCOSE 84 11/09/2019 1107   BUN 8 11/09/2019 1107   CREATININE 0.95 11/09/2019 1107   CREATININE 0.74 09/06/2019 1506   CREATININE 0.65 08/11/2017 1159   CALCIUM 8.1 (L) 11/09/2019 1107   PROT 6.2 (L) 11/09/2019 1107   ALBUMIN 2.7 (L) 11/09/2019 1107   AST 31 11/09/2019 1107   AST 40 09/06/2019 1506   ALT 18 11/09/2019 1107   ALT 23 09/06/2019 1506   ALT 99 (H) 03/31/2017 1408   ALKPHOS 83 11/09/2019 1107   BILITOT 1.0 11/09/2019 1107   BILITOT 1.3 (H) 09/06/2019 1506   GFRNONAA >60 11/09/2019 1107   GFRNONAA >60 09/06/2019 1506   GFRNONAA 104 08/11/2017 1159   GFRAA >60 11/09/2019 1107   GFRAA >60 09/06/2019 1506   GFRAA 120 08/11/2017 1159         RADIOGRAPHY: IR Paracentesis  Result Date: 11/13/2019 INDICATION: Cirrhosis; ascites EXAM:  ULTRASOUND-GUIDED PARACENTESIS COMPARISON:  None. MEDICATIONS: 10 cc 1% lidocaine COMPLICATIONS: None immediate. TECHNIQUE: Informed written consent was obtained from the patient after a discussion of the risks, benefits and alternatives to treatment. A timeout was performed prior to the initiation of the procedure. Initial ultrasound scanning demonstrates a large amount of ascites within the right lower abdominal quadrant. The right lower abdomen was prepped and draped in the usual sterile fashion. 1% lidocaine with epinephrine was used for local anesthesia. Under direct ultrasound guidance, a 19 gauge, 7-cm, Yueh catheter was introduced. An ultrasound image was saved for documentation purposed. The paracentesis was performed. The catheter was removed and a dressing was applied. The patient tolerated the procedure well without immediate post procedural complication. FINDINGS: A total of approximately 2.9 liters of clear yellow fluid was removed. Samples were sent to the laboratory as requested by the clinical team. IMPRESSION: Successful ultrasound-guided paracentesis yielding 2.9 liters of peritoneal fluid. Read by Lavonia Drafts [facility] Electronically Signed   By: Corrie Mckusick D.O.   On: 11/13/2019 14:40      IMPRESSION/PLAN: Right Breast Cancer   It was a pleasure meeting the patient today. We discussed the risks, benefits, and side effects of radiotherapy. I recommend radiotherapy to the right breast to reduce her risk of locoregional recurrence by 2/3.  We discussed that radiation would take approximately 4 weeks to complete and that I would give the patient a few weeks to heal following surgery before starting treatment planning. We spoke about acute effects including skin irritation and fatigue as well as much less common late effects including internal organ injury or irritation. We spoke about the latest technology that is used to minimize the risk of late effects for patients undergoing radiotherapy to  the breast or chest wall. No guarantees of treatment were given. The patient is enthusiastic about proceeding with treatment. I look forward to participating in the patient's care.  We will proceed with CT simulation early next week and start treatment after New Year's to facilitate patient's plans over the holidays.  We discussed measures to reduce the risk of infection during the COVID-19 pandemic.   This encounter was provided by telemedicine platform by telephone as patient was unable to access MyChart video during pandemic precautions The patient has given verbal consent for this type of encounter and has been advised to only accept a meeting of this type in a secure network environment. The time spent during this encounter  was 17 minutes. The attendants for this meeting include Eppie Gibson  and Lataunya Ruud.  During the encounter, Eppie Gibson was located at [facility] Radiation Oncology Department.  Yoneko Talerico was located at home.    __________________________________________   Eppie Gibson, MD   This document serves as a record of services personally performed by Eppie Gibson, MD. It was created on her behalf by Wilburn Mylar, a trained medical scribe. The creation of this record is based on the scribe's personal observations and the provider's statements to them. This document has been checked and approved by the attending provider.

## 2019-11-16 ENCOUNTER — Ambulatory Visit
Admission: RE | Admit: 2019-11-16 | Discharge: 2019-11-16 | Disposition: A | Discharge status: Home / Self Care | Payer: BC Managed Care – PPO | Source: Ambulatory Visit | Attending: Radiation Oncology | Admitting: Radiation Oncology

## 2019-11-16 ENCOUNTER — Other Ambulatory Visit: Payer: Self-pay

## 2019-11-16 ENCOUNTER — Encounter: Payer: Self-pay | Admitting: Radiation Oncology

## 2019-11-16 DIAGNOSIS — Z9889 Other specified postprocedural states: Secondary | ICD-10-CM | POA: Diagnosis not present

## 2019-11-16 DIAGNOSIS — Z170 Estrogen receptor positive status [ER+]: Secondary | ICD-10-CM

## 2019-11-16 DIAGNOSIS — K7469 Other cirrhosis of liver: Secondary | ICD-10-CM | POA: Diagnosis not present

## 2019-11-16 DIAGNOSIS — C50111 Malignant neoplasm of central portion of right female breast: Secondary | ICD-10-CM | POA: Diagnosis not present

## 2019-11-16 DIAGNOSIS — C220 Liver cell carcinoma: Secondary | ICD-10-CM | POA: Diagnosis not present

## 2019-11-19 ENCOUNTER — Other Ambulatory Visit: Payer: Self-pay

## 2019-11-19 ENCOUNTER — Ambulatory Visit
Admission: RE | Admit: 2019-11-19 | Discharge: 2019-11-19 | Disposition: A | Discharge status: Home / Self Care | Payer: BC Managed Care – PPO | Source: Ambulatory Visit | Attending: Radiation Oncology | Admitting: Radiation Oncology

## 2019-11-19 ENCOUNTER — Other Ambulatory Visit (INDEPENDENT_AMBULATORY_CARE_PROVIDER_SITE_OTHER): Payer: BC Managed Care – PPO

## 2019-11-19 ENCOUNTER — Other Ambulatory Visit: Payer: Self-pay | Admitting: Radiation Oncology

## 2019-11-19 DIAGNOSIS — K7031 Alcoholic cirrhosis of liver with ascites: Secondary | ICD-10-CM

## 2019-11-19 DIAGNOSIS — E876 Hypokalemia: Secondary | ICD-10-CM | POA: Diagnosis not present

## 2019-11-19 DIAGNOSIS — K7011 Alcoholic hepatitis with ascites: Secondary | ICD-10-CM

## 2019-11-19 DIAGNOSIS — C50111 Malignant neoplasm of central portion of right female breast: Secondary | ICD-10-CM | POA: Insufficient documentation

## 2019-11-19 DIAGNOSIS — B182 Chronic viral hepatitis C: Secondary | ICD-10-CM

## 2019-11-19 DIAGNOSIS — Z170 Estrogen receptor positive status [ER+]: Secondary | ICD-10-CM | POA: Insufficient documentation

## 2019-11-19 DIAGNOSIS — C220 Liver cell carcinoma: Secondary | ICD-10-CM

## 2019-11-19 LAB — BASIC METABOLIC PANEL
BUN: 15 mg/dL (ref 6–23)
CO2: 26 mEq/L (ref 19–32)
Calcium: 8.7 mg/dL (ref 8.4–10.5)
Chloride: 104 mEq/L (ref 96–112)
Creatinine, Ser: 0.7 mg/dL (ref 0.40–1.20)
GFR: 87.67 mL/min (ref >60.00)
Glucose, Bld: 125 mg/dL — ABNORMAL HIGH (ref 70–99)
Potassium: 3.8 mEq/L (ref 3.5–5.1)
Sodium: 137 mEq/L (ref 135–145)

## 2019-11-19 NOTE — Progress Notes (Signed)
  Radiation Oncology         [phone] ________________________________  Name: Theresa Rocha MRN: [id number]  Date: 11/19/2019  DOB: [birth year]  SIMULATION AND TREATMENT PLANNING NOTE    Outpatient  DIAGNOSIS:  C50.111     ICD-10-CM   1. Ascites due to alcoholic hepatitis  [id number] Ambulatory referral to Nutrition and Diabetic Education  2. Malignant neoplasm of central portion of right breast in female, estrogen receptor positive ([place])  C50.111 Ambulatory referral to Nutrition and Diabetic Education   Z17.0   3. Hepatocellular carcinoma ([place])  C22.0 Ambulatory referral to Nutrition and Diabetic Education  4. Chronic hepatitis C without hepatic coma (HCC)  B18.2 Ambulatory referral to Nutrition and Diabetic Education    NARRATIVE:  The patient was brought to the [place].  Identity was confirmed.  All relevant records and images related to the planned course of therapy were reviewed.  The patient freely provided informed written consent to proceed with treatment after reviewing the details related to the planned course of therapy. The consent form was witnessed and verified by the simulation staff.    Then, the patient was set-up in a stable reproducible supine position for radiation therapy with her ipsilateral arm over her head, and her upper body secured in a custom-made Vac-lok device.  CT images were obtained.  Surface markings were placed.  The CT images were loaded into the planning software.    TREATMENT PLANNING NOTE: Treatment planning then occurred.  The radiation prescription was entered and confirmed.     A total of 3 medically necessary complex treatment devices were fabricated and supervised by me: 2 fields with MLCs for custom blocks to protect heart, and lungs;  and, a Vac-lok. MORE COMPLEX DEVICES MAY BE MADE IN DOSIMETRY FOR FIELD IN FIELD BEAMS FOR DOSE HOMOGENEITY.  I have requested : 3D Simulation which is medically necessary to give adequate  dose to at risk tissues while sparing lungs and heart.  I have requested a DVH of the following structures: lungs, heart, right lumpectomy cavity.    The patient will receive 40.05 Gy in 15 fractions to the right breast with 2 tangential fields.  This will be followed by a boost.  Optical Surface Tracking Plan:  Since intensity modulated radiotherapy (IMRT) and 3D conformal radiation treatment methods are predicated on accurate and precise positioning for treatment, intrafraction motion monitoring is medically necessary to ensure accurate and safe treatment delivery. The ability to quantify intrafraction motion without excessive ionizing radiation dose can only be performed with optical surface tracking. Accordingly, surface imaging offers the opportunity to obtain 3D measurements of patient position throughout IMRT and 3D treatments without excessive radiation exposure. I am ordering optical surface tracking for this patient's upcoming course of radiotherapy.  ________________________________   Reference:  [name] Alert, J, et al. Surface imaging-based analysis of intrafraction motion for breast radiotherapy patients.Journal of [place], n. 6, nov. 2014. ISSN [id number].  Available at: <http://www.jacmp.org/index.php/jacmp/article/view/4957>.   Note: Patient's scars were examined and felt to have healed well for treatment planning.  Patient reported trouble gaining weight and challenges with finding appropriate diet that is compatible with her liver failure.  Referral made to dietitian/nutrition.  -----------------------------------  Eppie Gibson, MD

## 2019-11-21 ENCOUNTER — Encounter: Payer: Self-pay | Admitting: Internal Medicine

## 2019-11-21 ENCOUNTER — Ambulatory Visit: Payer: BC Managed Care – PPO | Admitting: Internal Medicine

## 2019-11-21 ENCOUNTER — Telehealth: Payer: Self-pay | Admitting: Hematology

## 2019-11-21 ENCOUNTER — Other Ambulatory Visit: Payer: Self-pay

## 2019-11-21 ENCOUNTER — Other Ambulatory Visit (INDEPENDENT_AMBULATORY_CARE_PROVIDER_SITE_OTHER): Payer: BC Managed Care – PPO

## 2019-11-21 VITALS — BP 100/60 | HR 93 | Temp 98.3°F | Ht 63.0 in | Wt 91.6 lb

## 2019-11-21 DIAGNOSIS — K589 Irritable bowel syndrome without diarrhea: Secondary | ICD-10-CM

## 2019-11-21 DIAGNOSIS — K7460 Unspecified cirrhosis of liver: Secondary | ICD-10-CM | POA: Diagnosis not present

## 2019-11-21 DIAGNOSIS — R197 Diarrhea, unspecified: Secondary | ICD-10-CM | POA: Diagnosis not present

## 2019-11-21 DIAGNOSIS — C50111 Malignant neoplasm of central portion of right female breast: Secondary | ICD-10-CM | POA: Diagnosis not present

## 2019-11-21 DIAGNOSIS — Z170 Estrogen receptor positive status [ER+]: Secondary | ICD-10-CM | POA: Diagnosis not present

## 2019-11-21 DIAGNOSIS — K7011 Alcoholic hepatitis with ascites: Secondary | ICD-10-CM

## 2019-11-21 LAB — BASIC METABOLIC PANEL
BUN: 14 mg/dL (ref 6–23)
CO2: 28 mEq/L (ref 19–32)
Calcium: 8.9 mg/dL (ref 8.4–10.5)
Chloride: 101 mEq/L (ref 96–112)
Creatinine, Ser: 0.71 mg/dL (ref 0.40–1.20)
GFR: 86.24 mL/min (ref >60.00)
Glucose, Bld: 97 mg/dL (ref 70–99)
Potassium: 3.4 mEq/L — ABNORMAL LOW (ref 3.5–5.1)
Sodium: 136 mEq/L (ref 135–145)

## 2019-11-21 MED ORDER — DICYCLOMINE HCL 20 MG PO TABS: ORAL_TABLET | ORAL | 3 refills | Status: DC

## 2019-11-21 MED FILL — ANASTROZOLE 1 MG TABLET: 1 | 30 days supply | Qty: 30 | Fill #2

## 2019-11-21 NOTE — Telephone Encounter (Signed)
Scheduled appt per 12/21 sch message - mailed reminder letter with appt date and time

## 2019-11-21 NOTE — Patient Instructions (Addendum)
We have sent the following medications to your pharmacy for you to pick up at your convenience:  [facility] provider has requested that you go to the basement level for lab work before leaving today. Press "B" on the elevator. The lab is located at the first door on the left as you exit the elevator.  Please follow up in about 3 months

## 2019-11-22 ENCOUNTER — Encounter: Payer: Self-pay | Admitting: Internal Medicine

## 2019-11-22 LAB — SPECIMEN STATUS REPORT

## 2019-11-22 NOTE — Progress Notes (Signed)
HISTORY OF PRESENT ILLNESS:  Theresa Rocha is a 53 y.o. 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  Also recent diagnosis of breast cancer for which she is anticipating radiation therapy in January.  Screening upper endoscopy with 1+ varices without stigmata.  Mild portal gastropathy.  Has been seen by the transplant clinic at [facility].  Meld score 14.  Last seen by myself September 26, 2019.  Recently contacted the office complaining of recurrent abdominal ascites.  Underwent 2.9 L paracentesis.  Studies negative for SBP.  Daily diuretics resumed at Aldactone 100 mg daily and Lasix 40 mg daily.  She presents today for follow-up as requested.  She is feeling better since her paracentesis.  She is concerned about the inability to gain weight.  Theresa Rocha has always been thin.  Her last weight was 99 pounds.  91 pounds today.  Most recent laboratories from November 09, 2019 finds albumin 2.7.  Normal liver tests with AST 31, ALT 18, alkaline phosphatase 83, total bilirubin 1.0.  Unremarkable CBC except for platelets 85,000.  Last ultrasound guided paracentesis as described (November 13, 2019).  Theresa Rocha also reports intermittent problems with cramping and loose stools consistent with her known irritable bowel syndrome.  She requests a medication for her cramps.  REVIEW OF SYSTEMS:  All non-GI ROS negative unless otherwise stated in the HPI except for muscle cramps, night sweats, back pain  Past Medical History:  Diagnosis Date  . Breast cancer ([place]) 08/30/2019  . Cancer [facility])    liver- just diagnosed, not started treatment yet  . Cervicalgia   . COPD (chronic obstructive pulmonary disease) ([place])   . GERD (gastroesophageal reflux disease)   . Headache   . Hepatic cirrhosis ([place])   . Hepatitis C    resolved 2018  . Hepatocellular carcinoma ([place])   . IBS (irritable bowel syndrome)   . Lactose intolerance   . Portal hypertension ([place])    . Raynaud's syndrome   . Thrombocytopenia ([place])     Past Surgical History:  Procedure Laterality Date  . BREAST LUMPECTOMY WITH AXILLARY LYMPH NODE BIOPSY Right 10/16/2019   Procedure: RIGHT BREAST LUMPECTOMY WITH SENTINEL LYMPH NODE BIOPSY;  Surgeon: Stark Klein, MD;  Location: [place];  Service: General;  Laterality: Right;  . COLONOSCOPY    . HAMMER TOE SURGERY Left 2012   pins placed in great toe, 2nd,3rd, 4th toes, all pins removed except great toe  . IR PARACENTESIS  11/13/2019  . IR RADIOLOGIST EVAL & MGMT  08/16/2019  . PELVIC LAPAROSCOPY Bilateral 1992  . UPPER GASTROINTESTINAL ENDOSCOPY  2002   had esophageal dilitation    Social History Theresa Rocha  reports that she has been smoking cigarettes. She started smoking about 41 years ago. She has a 15.00 pack-year smoking history. She has never used smokeless tobacco. She reports previous alcohol use of about 6.0 standard drinks of alcohol per week. She reports that she does not use drugs.  family history includes Liver cancer in her maternal grandmother and mother; Lung cancer in her father and mother.  Allergies  Allergen Reactions  . Hydromorphone Hcl Nausea And Vomiting  . Aspirin Nausea Only       PHYSICAL EXAMINATION: Vital signs: BP 100/60   Pulse 93   Temp 98.3 F (36.8 C)   Ht 5\' 3"  (1.6 m)   Wt 91 lb 9.6 oz (41.5 kg)   LMP 04/20/2013   BMI  16.23 kg/m   Constitutional: generally well-appearing, no acute distress Psychiatric: alert and oriented x3, cooperative Eyes: extraocular movements intact, anicteric, conjunctiva pink Mouth: oral pharynx moist, no lesions Neck: supple no lymphadenopathy Cardiovascular: heart regular rate and rhythm, no murmur Lungs: clear to auscultation bilaterally Abdomen: soft, nontender, nondistended, no obvious ascites, no peritoneal signs, normal bowel sounds, no organomegaly Rectal: Omitted Extremities: no clubbing, cyanosis, or lower extremity edema bilaterally Skin:  Spider telangiectatic is present.  No additional lesions on visible extremities Neuro: No focal deficits. No asterixis.    ASSESSMENT:  1.  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  Recent problems with recurrent ascites status post paracentesis.  Now doing better on diuretics 2.  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  Recent diagnosis of breast cancer.  Anticipating radiation therapy in January 4.  Irritable bowel syndrome.  Diarrhea predominant.  Problematic at this time 5.  Probable small HCC being monitored closely by IR 6.  Concurrent care with transplant team at [place]:  1.  Continue Aldactone 100 mg daily 2.  Continue Lasix 40 mg daily 3.  Basic metabolic panel today 4.  Prescribe Bentyl 20 mg every 4-6 hours as needed for cramping pain.  Medication side effects reviewed 5.  GI office follow-up 3 months 25-minute spent face-to-face with the patient.  Greater than 50% of the time used for counseling regarding her advanced liver disease and active irritable bowel syndrome

## 2019-11-26 LAB — CLOSTRIDIUM DIFFICILE BY PCR: Toxigenic C. Difficile by PCR: NEGATIVE

## 2019-11-28 ENCOUNTER — Telehealth: Payer: Self-pay

## 2019-11-28 NOTE — Telephone Encounter (Signed)
Malachy Mood, please refill and let her know, thanks    Truitt Merle MD

## 2019-11-28 NOTE — Telephone Encounter (Signed)
Instructed patient to continue taking the Anastrozole, she verbalized an understanding and states she just got it refilled.  Will let us know when she needs another.

## 2019-11-28 NOTE — Telephone Encounter (Addendum)
Patient calls stating that she is currently taking Anastrozole and she is on her last refill.  She is uncertain whether she is supposed to continue this medication or not.    Work [phone] or cell [phone]   Spoke with patient to let her know [name] would like her to continue taking the Anastrozole.  She states she just got it filled.  Will let us know when she needs additional refills.

## 2019-11-30 DIAGNOSIS — Z923 Personal history of irradiation: Secondary | ICD-10-CM

## 2019-11-30 HISTORY — DX: Personal history of irradiation: Z92.3

## 2019-12-02 ENCOUNTER — Other Ambulatory Visit: Payer: Self-pay | Admitting: Oncology

## 2019-12-03 ENCOUNTER — Encounter: Payer: Self-pay | Admitting: Nutrition

## 2019-12-03 ENCOUNTER — Telehealth: Payer: Self-pay

## 2019-12-03 ENCOUNTER — Ambulatory Visit: Payer: BC Managed Care – PPO | Admitting: Nutrition

## 2019-12-03 ENCOUNTER — Ambulatory Visit
Admission: RE | Admit: 2019-12-03 | Discharge: 2019-12-03 | Disposition: A | Discharge status: Home / Self Care | Payer: BC Managed Care – PPO | Source: Ambulatory Visit | Attending: Radiation Oncology | Admitting: Radiation Oncology

## 2019-12-03 ENCOUNTER — Inpatient Hospital Stay: Payer: BC Managed Care – PPO | Attending: Hematology | Admitting: Nutrition

## 2019-12-03 ENCOUNTER — Other Ambulatory Visit: Payer: Self-pay

## 2019-12-03 DIAGNOSIS — Z170 Estrogen receptor positive status [ER+]: Secondary | ICD-10-CM | POA: Diagnosis not present

## 2019-12-03 DIAGNOSIS — C50111 Malignant neoplasm of central portion of right female breast: Secondary | ICD-10-CM | POA: Insufficient documentation

## 2019-12-03 MED ORDER — SONAFINE EX EMUL
1.0000 "application " | Freq: Once | CUTANEOUS | Status: AC
  Administered 2019-12-03: 1 via TOPICAL

## 2019-12-03 MED ORDER — ALRA NON-METALLIC DEODORANT (RAD-ONC)
1.0000 "application " | Freq: Once | TOPICAL | Status: AC
  Administered 2019-12-03: 1 via TOPICAL

## 2019-12-03 NOTE — Telephone Encounter (Signed)
-----   Message from Algernon Huxley, RN sent at 11/19/2019 11:25 AM EST ----- Regarding: labs Pt needs labs, orders in epic

## 2019-12-03 NOTE — Progress Notes (Signed)
Pt here for patient teaching.  Pt given Radiation and You booklet, skin care instructions, Alra deodorant and Sonafine.  Reviewed areas of pertinence such as fatigue, skin changes, breast tenderness and breast swelling . Pt able to give teach back of to pat skin, use unscented/gentle soap, drink plenty of water and sitz bath,apply Sonafine bid, avoid applying anything to skin within 4 hours of treatment, avoid wearing an under wire bra and to use an electric razor if they must shave. Pt verbalizes understanding of information given and will contact nursing with any questions or concerns.     [email or web address]/treatmentinformation/whattoexpect/index

## 2019-12-03 NOTE — Telephone Encounter (Signed)
Pt aware.

## 2019-12-03 NOTE — Progress Notes (Signed)
54 year old female diagnosed with hepatocellular/breast cancer.  She is a patient of [name] and Dr. Isidore Moos.  Patient is receiving radiation therapy to her breast for her breast cancer.    Past medical history includes Raynaud's disease, IBS, hepatitis C, hepatitis cirrhosis, GERD, COPD, and lactose intolerance.  Medications include Arimidex, Lasix, Imodium, Zofran, Protonix, and Phenergan.  Labs include potassium 3.4.  Height: 5 feet 3 inches. Weight: 98.4 pounds.  Patient has ascites. Usual body weight: 100-108 pounds. BMI is not accurate secondary to ascites.  Noted patient had paracentesis on December 15 and had 2.5 L of fluid removed. She states she is lactose intolerant and does not tolerate greasy foods. Reports she is trying to keep her sodium intake at no more than 2000 mg daily.  She understands how to read a food label. Patient hopes to find a way to increase overall calories and protein.  Nutrition diagnosis: Food and nutrition related knowledge deficit related to cancer and associated treatments as evidenced by no prior need for nutrition related information.  Intervention: Educated patient on strategies for increasing protein within stated intolerances.   I provided a fact sheet and samples of lactose free nutrition supplements. Encourage patient to continue to monitor sodium in various foods she is consuming. Encourage smaller more frequent meals and snacks. Provided fact sheets gave patient my contact information.  Monitoring, evaluation, goals: Patient will tolerate maintenance of lean body mass.  Next visit: Patient will contact me with questions or concerns.

## 2019-12-03 NOTE — Progress Notes (Signed)
Patient did not show up for nutrition appointment. 

## 2019-12-04 ENCOUNTER — Other Ambulatory Visit: Payer: Self-pay

## 2019-12-04 ENCOUNTER — Ambulatory Visit
Admission: RE | Admit: 2019-12-04 | Discharge: 2019-12-04 | Disposition: A | Discharge status: Home / Self Care | Payer: BC Managed Care – PPO | Source: Ambulatory Visit | Attending: Radiation Oncology | Admitting: Radiation Oncology

## 2019-12-04 DIAGNOSIS — Z170 Estrogen receptor positive status [ER+]: Secondary | ICD-10-CM | POA: Diagnosis not present

## 2019-12-04 DIAGNOSIS — C50111 Malignant neoplasm of central portion of right female breast: Secondary | ICD-10-CM | POA: Diagnosis not present

## 2019-12-05 ENCOUNTER — Ambulatory Visit
Admission: RE | Admit: 2019-12-05 | Discharge: 2019-12-05 | Disposition: A | Discharge status: Home / Self Care | Payer: BC Managed Care – PPO | Source: Ambulatory Visit | Attending: Radiation Oncology | Admitting: Radiation Oncology

## 2019-12-05 ENCOUNTER — Other Ambulatory Visit (INDEPENDENT_AMBULATORY_CARE_PROVIDER_SITE_OTHER): Payer: BC Managed Care – PPO

## 2019-12-05 ENCOUNTER — Other Ambulatory Visit: Payer: Self-pay

## 2019-12-05 DIAGNOSIS — K7031 Alcoholic cirrhosis of liver with ascites: Secondary | ICD-10-CM

## 2019-12-05 DIAGNOSIS — Z170 Estrogen receptor positive status [ER+]: Secondary | ICD-10-CM | POA: Diagnosis not present

## 2019-12-05 DIAGNOSIS — C50111 Malignant neoplasm of central portion of right female breast: Secondary | ICD-10-CM | POA: Diagnosis not present

## 2019-12-05 LAB — BASIC METABOLIC PANEL
BUN: 11 mg/dL (ref 6–23)
CO2: 30 mEq/L (ref 19–32)
Calcium: 8.6 mg/dL (ref 8.4–10.5)
Chloride: 103 mEq/L (ref 96–112)
Creatinine, Ser: 0.68 mg/dL (ref 0.40–1.20)
GFR: 90.64 mL/min (ref >60.00)
Glucose, Bld: 103 mg/dL — ABNORMAL HIGH (ref 70–99)
Potassium: 3.9 mEq/L (ref 3.5–5.1)
Sodium: 136 mEq/L (ref 135–145)

## 2019-12-06 ENCOUNTER — Other Ambulatory Visit: Payer: Self-pay

## 2019-12-06 ENCOUNTER — Ambulatory Visit
Admission: RE | Admit: 2019-12-06 | Discharge: 2019-12-06 | Disposition: A | Discharge status: Home / Self Care | Payer: BC Managed Care – PPO | Source: Ambulatory Visit | Attending: Radiation Oncology | Admitting: Radiation Oncology

## 2019-12-06 DIAGNOSIS — K7460 Unspecified cirrhosis of liver: Secondary | ICD-10-CM

## 2019-12-06 DIAGNOSIS — Z170 Estrogen receptor positive status [ER+]: Secondary | ICD-10-CM | POA: Diagnosis not present

## 2019-12-06 DIAGNOSIS — C50111 Malignant neoplasm of central portion of right female breast: Secondary | ICD-10-CM | POA: Diagnosis not present

## 2019-12-07 ENCOUNTER — Other Ambulatory Visit: Payer: Self-pay

## 2019-12-07 ENCOUNTER — Ambulatory Visit
Admission: RE | Admit: 2019-12-07 | Discharge: 2019-12-07 | Disposition: A | Discharge status: Home / Self Care | Payer: BC Managed Care – PPO | Source: Ambulatory Visit | Attending: Radiation Oncology | Admitting: Radiation Oncology

## 2019-12-07 DIAGNOSIS — C50111 Malignant neoplasm of central portion of right female breast: Secondary | ICD-10-CM | POA: Diagnosis not present

## 2019-12-07 DIAGNOSIS — Z170 Estrogen receptor positive status [ER+]: Secondary | ICD-10-CM | POA: Diagnosis not present

## 2019-12-10 ENCOUNTER — Other Ambulatory Visit: Payer: Self-pay

## 2019-12-10 ENCOUNTER — Ambulatory Visit
Admission: RE | Admit: 2019-12-10 | Discharge: 2019-12-10 | Disposition: A | Discharge status: Home / Self Care | Payer: BC Managed Care – PPO | Source: Ambulatory Visit | Attending: Radiation Oncology | Admitting: Radiation Oncology

## 2019-12-10 ENCOUNTER — Encounter: Payer: Self-pay | Admitting: *Deleted

## 2019-12-10 DIAGNOSIS — C50111 Malignant neoplasm of central portion of right female breast: Secondary | ICD-10-CM | POA: Diagnosis not present

## 2019-12-10 DIAGNOSIS — Z170 Estrogen receptor positive status [ER+]: Secondary | ICD-10-CM | POA: Diagnosis not present

## 2019-12-11 ENCOUNTER — Other Ambulatory Visit: Payer: Self-pay

## 2019-12-11 ENCOUNTER — Ambulatory Visit
Admission: RE | Admit: 2019-12-11 | Discharge: 2019-12-11 | Disposition: A | Discharge status: Home / Self Care | Payer: BC Managed Care – PPO | Source: Ambulatory Visit | Attending: Radiation Oncology | Admitting: Radiation Oncology

## 2019-12-11 DIAGNOSIS — Z170 Estrogen receptor positive status [ER+]: Secondary | ICD-10-CM | POA: Diagnosis not present

## 2019-12-11 DIAGNOSIS — C50111 Malignant neoplasm of central portion of right female breast: Secondary | ICD-10-CM | POA: Diagnosis not present

## 2019-12-12 ENCOUNTER — Other Ambulatory Visit: Payer: Self-pay

## 2019-12-12 ENCOUNTER — Ambulatory Visit
Admission: RE | Admit: 2019-12-12 | Discharge: 2019-12-12 | Disposition: A | Discharge status: Home / Self Care | Payer: BC Managed Care – PPO | Source: Ambulatory Visit | Attending: Radiation Oncology | Admitting: Radiation Oncology

## 2019-12-12 DIAGNOSIS — Z170 Estrogen receptor positive status [ER+]: Secondary | ICD-10-CM | POA: Diagnosis not present

## 2019-12-12 DIAGNOSIS — C50111 Malignant neoplasm of central portion of right female breast: Secondary | ICD-10-CM | POA: Diagnosis not present

## 2019-12-13 ENCOUNTER — Other Ambulatory Visit: Payer: Self-pay

## 2019-12-13 ENCOUNTER — Ambulatory Visit
Admission: RE | Admit: 2019-12-13 | Discharge: 2019-12-13 | Disposition: A | Discharge status: Home / Self Care | Payer: BC Managed Care – PPO | Source: Ambulatory Visit | Attending: Radiation Oncology | Admitting: Radiation Oncology

## 2019-12-13 DIAGNOSIS — Z170 Estrogen receptor positive status [ER+]: Secondary | ICD-10-CM | POA: Diagnosis not present

## 2019-12-13 DIAGNOSIS — C50111 Malignant neoplasm of central portion of right female breast: Secondary | ICD-10-CM | POA: Diagnosis not present

## 2019-12-14 ENCOUNTER — Ambulatory Visit: Payer: BC Managed Care – PPO

## 2019-12-17 ENCOUNTER — Ambulatory Visit: Payer: BC Managed Care – PPO | Admitting: Radiation Oncology

## 2019-12-17 ENCOUNTER — Other Ambulatory Visit: Payer: Self-pay

## 2019-12-17 ENCOUNTER — Ambulatory Visit
Admission: RE | Admit: 2019-12-17 | Discharge: 2019-12-17 | Disposition: A | Discharge status: Home / Self Care | Payer: BC Managed Care – PPO | Source: Ambulatory Visit | Attending: Radiation Oncology | Admitting: Radiation Oncology

## 2019-12-17 DIAGNOSIS — C50111 Malignant neoplasm of central portion of right female breast: Secondary | ICD-10-CM | POA: Diagnosis not present

## 2019-12-17 DIAGNOSIS — Z170 Estrogen receptor positive status [ER+]: Secondary | ICD-10-CM | POA: Diagnosis not present

## 2019-12-18 ENCOUNTER — Other Ambulatory Visit: Payer: Self-pay

## 2019-12-18 ENCOUNTER — Ambulatory Visit
Admission: RE | Admit: 2019-12-18 | Discharge: 2019-12-18 | Disposition: A | Discharge status: Home / Self Care | Payer: BC Managed Care – PPO | Source: Ambulatory Visit | Attending: Radiation Oncology | Admitting: Radiation Oncology

## 2019-12-18 DIAGNOSIS — C50111 Malignant neoplasm of central portion of right female breast: Secondary | ICD-10-CM | POA: Diagnosis not present

## 2019-12-18 DIAGNOSIS — Z170 Estrogen receptor positive status [ER+]: Secondary | ICD-10-CM | POA: Diagnosis not present

## 2019-12-19 ENCOUNTER — Other Ambulatory Visit: Payer: Self-pay

## 2019-12-19 ENCOUNTER — Ambulatory Visit
Admission: RE | Admit: 2019-12-19 | Discharge: 2019-12-19 | Disposition: A | Discharge status: Home / Self Care | Payer: BC Managed Care – PPO | Source: Ambulatory Visit | Attending: Radiation Oncology | Admitting: Radiation Oncology

## 2019-12-19 ENCOUNTER — Telehealth: Payer: Self-pay | Admitting: Hematology

## 2019-12-19 ENCOUNTER — Other Ambulatory Visit (INDEPENDENT_AMBULATORY_CARE_PROVIDER_SITE_OTHER): Payer: BC Managed Care – PPO

## 2019-12-19 DIAGNOSIS — C50111 Malignant neoplasm of central portion of right female breast: Secondary | ICD-10-CM | POA: Diagnosis not present

## 2019-12-19 DIAGNOSIS — K7460 Unspecified cirrhosis of liver: Secondary | ICD-10-CM | POA: Diagnosis not present

## 2019-12-19 DIAGNOSIS — Z170 Estrogen receptor positive status [ER+]: Secondary | ICD-10-CM | POA: Diagnosis not present

## 2019-12-19 LAB — BASIC METABOLIC PANEL
BUN: 12 mg/dL (ref 6–23)
CO2: 28 mEq/L (ref 19–32)
Calcium: 8.5 mg/dL (ref 8.4–10.5)
Chloride: 104 mEq/L (ref 96–112)
Creatinine, Ser: 0.69 mg/dL (ref 0.40–1.20)
GFR: 89.11 mL/min (ref >60.00)
Glucose, Bld: 113 mg/dL — ABNORMAL HIGH (ref 70–99)
Potassium: 3.8 mEq/L (ref 3.5–5.1)
Sodium: 137 mEq/L (ref 135–145)

## 2019-12-19 MED ORDER — POTASSIUM CHLORIDE CRYS ER 20 MEQ PO TBCR: 20.0000 meq | EXTENDED_RELEASE_TABLET | Freq: Every day | ORAL | 0 refills | Status: DC

## 2019-12-19 NOTE — Telephone Encounter (Signed)
Spoke with pt and she is coming for labs today and wanted to make sure there is an order in place. Pt states she only has 2 potassium pills left, refill sent to pharmacy but discussed with her that the labs may decide if she continues the potassium or not. Pt verbalized understanding.

## 2019-12-19 NOTE — Telephone Encounter (Signed)
Returned patient's phone call regarding rescheduling 01/22 appointment, per patient's request appointment has moved to 02/04.

## 2019-12-19 NOTE — Telephone Encounter (Signed)
Pt has questions about labs and would like a call back.

## 2019-12-20 ENCOUNTER — Ambulatory Visit
Admission: RE | Admit: 2019-12-20 | Discharge: 2019-12-20 | Disposition: A | Discharge status: Home / Self Care | Payer: BC Managed Care – PPO | Source: Ambulatory Visit | Attending: Radiation Oncology | Admitting: Radiation Oncology

## 2019-12-20 ENCOUNTER — Other Ambulatory Visit: Payer: Self-pay

## 2019-12-20 DIAGNOSIS — K7460 Unspecified cirrhosis of liver: Secondary | ICD-10-CM

## 2019-12-20 DIAGNOSIS — Z170 Estrogen receptor positive status [ER+]: Secondary | ICD-10-CM | POA: Diagnosis not present

## 2019-12-20 DIAGNOSIS — C50111 Malignant neoplasm of central portion of right female breast: Secondary | ICD-10-CM | POA: Diagnosis not present

## 2019-12-21 ENCOUNTER — Ambulatory Visit
Admission: RE | Admit: 2019-12-21 | Discharge: 2019-12-21 | Disposition: A | Discharge status: Home / Self Care | Payer: BC Managed Care – PPO | Source: Ambulatory Visit | Attending: Radiation Oncology | Admitting: Radiation Oncology

## 2019-12-21 ENCOUNTER — Inpatient Hospital Stay: Payer: BC Managed Care – PPO | Admitting: Hematology

## 2019-12-21 ENCOUNTER — Other Ambulatory Visit: Payer: Self-pay

## 2019-12-21 DIAGNOSIS — Z170 Estrogen receptor positive status [ER+]: Secondary | ICD-10-CM | POA: Diagnosis not present

## 2019-12-21 DIAGNOSIS — C50111 Malignant neoplasm of central portion of right female breast: Secondary | ICD-10-CM | POA: Diagnosis not present

## 2019-12-24 ENCOUNTER — Ambulatory Visit: Payer: BC Managed Care – PPO | Admitting: Radiation Oncology

## 2019-12-24 ENCOUNTER — Ambulatory Visit
Admission: RE | Admit: 2019-12-24 | Discharge: 2019-12-24 | Disposition: A | Discharge status: Home / Self Care | Payer: BC Managed Care – PPO | Source: Ambulatory Visit | Attending: Radiation Oncology | Admitting: Radiation Oncology

## 2019-12-24 ENCOUNTER — Encounter: Payer: Self-pay | Admitting: Radiation Oncology

## 2019-12-24 ENCOUNTER — Other Ambulatory Visit: Payer: Self-pay

## 2019-12-24 ENCOUNTER — Ambulatory Visit: Payer: BC Managed Care – PPO

## 2019-12-24 DIAGNOSIS — Z170 Estrogen receptor positive status [ER+]: Secondary | ICD-10-CM | POA: Diagnosis not present

## 2019-12-24 DIAGNOSIS — C50111 Malignant neoplasm of central portion of right female breast: Secondary | ICD-10-CM | POA: Diagnosis not present

## 2019-12-25 ENCOUNTER — Ambulatory Visit: Payer: BC Managed Care – PPO

## 2019-12-25 ENCOUNTER — Other Ambulatory Visit: Payer: Self-pay

## 2019-12-25 ENCOUNTER — Ambulatory Visit
Admission: RE | Admit: 2019-12-25 | Discharge: 2019-12-25 | Disposition: A | Discharge status: Home / Self Care | Payer: BC Managed Care – PPO | Source: Ambulatory Visit | Attending: Radiation Oncology | Admitting: Radiation Oncology

## 2019-12-25 DIAGNOSIS — Z170 Estrogen receptor positive status [ER+]: Secondary | ICD-10-CM | POA: Diagnosis not present

## 2019-12-25 DIAGNOSIS — C50111 Malignant neoplasm of central portion of right female breast: Secondary | ICD-10-CM | POA: Diagnosis not present

## 2019-12-26 ENCOUNTER — Other Ambulatory Visit: Payer: Self-pay

## 2019-12-26 ENCOUNTER — Ambulatory Visit
Admission: RE | Admit: 2019-12-26 | Discharge: 2019-12-26 | Disposition: A | Discharge status: Home / Self Care | Payer: BC Managed Care – PPO | Source: Ambulatory Visit | Attending: Radiation Oncology | Admitting: Radiation Oncology

## 2019-12-26 DIAGNOSIS — Z170 Estrogen receptor positive status [ER+]: Secondary | ICD-10-CM | POA: Diagnosis not present

## 2019-12-26 DIAGNOSIS — C50111 Malignant neoplasm of central portion of right female breast: Secondary | ICD-10-CM | POA: Diagnosis not present

## 2019-12-27 ENCOUNTER — Other Ambulatory Visit: Payer: Self-pay

## 2019-12-27 ENCOUNTER — Ambulatory Visit
Admission: RE | Admit: 2019-12-27 | Discharge: 2019-12-27 | Disposition: A | Discharge status: Home / Self Care | Payer: BC Managed Care – PPO | Source: Ambulatory Visit | Attending: Radiation Oncology | Admitting: Radiation Oncology

## 2019-12-27 ENCOUNTER — Encounter: Payer: Self-pay | Admitting: *Deleted

## 2019-12-27 DIAGNOSIS — Z170 Estrogen receptor positive status [ER+]: Secondary | ICD-10-CM | POA: Diagnosis not present

## 2019-12-27 DIAGNOSIS — C50111 Malignant neoplasm of central portion of right female breast: Secondary | ICD-10-CM | POA: Diagnosis not present

## 2019-12-28 ENCOUNTER — Other Ambulatory Visit: Payer: Self-pay

## 2019-12-28 ENCOUNTER — Ambulatory Visit: Payer: BC Managed Care – PPO

## 2019-12-28 ENCOUNTER — Ambulatory Visit
Admission: RE | Admit: 2019-12-28 | Discharge: 2019-12-28 | Disposition: A | Discharge status: Home / Self Care | Payer: BC Managed Care – PPO | Source: Ambulatory Visit | Attending: Radiation Oncology | Admitting: Radiation Oncology

## 2019-12-28 DIAGNOSIS — Z170 Estrogen receptor positive status [ER+]: Secondary | ICD-10-CM

## 2019-12-28 DIAGNOSIS — C50111 Malignant neoplasm of central portion of right female breast: Secondary | ICD-10-CM | POA: Diagnosis not present

## 2019-12-28 MED ORDER — ANASTROZOLE 1 MG PO TABS: 1.0000 mg | ORAL_TABLET | Freq: Every day | ORAL | 2 refills | Status: DC

## 2019-12-28 MED FILL — ANASTROZOLE 1 MG TABLET: 1 | 30 days supply | Qty: 30 | Fill #0

## 2019-12-28 NOTE — Progress Notes (Signed)
[place]   [phone] [phone]   Clinic Follow up Note   Patient Care Team: Seward Carol, MD as PCP - General (Internal Medicine) Arna Snipe, RN (Inactive) as Oncology Nurse Navigator Irene Shipper, MD as Consulting Physician (Gastroenterology) Truitt Merle, MD as Consulting Physician (Hematology) Alla Feeling, NP as Nurse Practitioner (Nurse Practitioner) Stark Klein, MD as Consulting Physician (General Surgery) Aletta Edouard, MD as Consulting Physician (Interventional Radiology) Roosevelt Locks, [place] as Nurse Practitioner (Nurse Practitioner) Mauro Kaufmann, RN as Oncology Nurse Navigator Rockwell Germany, RN as Oncology Nurse Navigator  Date of Service:  01/03/2020  CHIEF COMPLAINT: F/u of right breast cancer and liver cancer   SUMMARY OF ONCOLOGIC HISTORY: Oncology History Overview Note  Cancer Staging Hepatocellular carcinoma [facility]) Staging form: Liver, AJCC 8th Edition - Clinical stage from 08/02/2019: Stage II (cT2, cN0, cM0) - Signed by Truitt Merle, MD on 08/02/2019  Malignant neoplasm of central portion of right breast in female, estrogen receptor positive ([place]) Staging form: Breast, AJCC 8th Edition - Clinical stage from 08/29/2019: Stage IB (cT2, cN0, cM0, G2, ER+, PR+, HER2-) - Signed by Truitt Merle, MD on 09/05/2019    Hepatocellular carcinoma ([place])  07/04/2019 Imaging   CT AP IMPRESSION: 1. Findings of cirrhosis and hepatomegaly.  Patent portal vein. 2. Numerous varicosities as described above. 3. Moderate to large amount of abdominopelvic ascites 4. Contrast filled appendix which appears to be a mildly dilated with apparent wall thickening which could be due to surrounding ascites.   07/09/2019 Imaging   ABD US IMPRESSION: 1. Three small hypoechoic lesions in the RIGHT hepatic lobe measuring less than 1 cm but not evident on comparison contrast enhanced CT from 07/04/2019. Recommend MRI of the abdomen without and with  contrast to evaluate these hepatic lesions in cirrhotic patient. 2. Increased liver echogenicity and lobular contour consistent cirrhosis. 3. Moderate mild to moderate volume of ascites. 4. Mild gallbladder wall thickening likely related to ascites. 5. No cholelithiasis or cholecystitis evident   07/26/2019 Imaging   MR ABD IMPRESSION: 1. In the lateral segment left hepatic lobe there are two LI-RADS category LR-5 lesions representing hepatocellular carcinoma. 2. There is also a LI-RADS category LR-3 lesion peripherally in the right hepatic lobe, intermediate probability for hepatocellular carcinoma. 3. Hepatic cirrhosis and diffuse wall thickening of the gallbladder. Widespread steatosis in the liver. 4. 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 5.  Aortic Atherosclerosis (ICD10-I70.0). 6. Widespread ascites and mesenteric edema.   07/26/2019 Tumor Marker   AFP 5.5 (baseline)   08/02/2019 Initial Diagnosis   Hepatocellular carcinoma ([place])   08/02/2019 Cancer Staging   Staging form: Liver, AJCC 8th Edition - Clinical stage from 08/02/2019: Stage II (cT2, cN0, cM0) - Signed by Truitt Merle, MD on 08/02/2019   08/07/2019 Imaging   CT Chest 08/07/19 IMPRESSION: 1. Tiny bilateral pulmonary nodules measuring up to 6 mm. These are too small to definitively characterize. Given the history of HCC, close follow-up recommended to ensure stability. 2.  Emphysema. (ICD10-J43.9)   08/09/2019 Procedure   Upper Endoscopy by Dr Henrene Pastor 08/09/19  IMPRESSION 1. Small esophageal varices 2. Mild diffuse portal hypertensive gastropathy 3. Otherwise normal EGD.   Malignant neoplasm of central portion of right breast in female, estrogen receptor positive ([place])  08/29/2019 Cancer Staging   Staging form: Breast, AJCC 8th Edition - Clinical stage from 08/29/2019: Stage IB (cT2, cN0, cM0, G2, ER+, PR+, HER2-) - Signed by Truitt Merle, MD on 09/05/2019  08/29/2019 Mammogram   Diagnostic mammogram  08/29/19 IMPRESSION: Suspicious mass in the 12 o'clock region of the right breast 3 cm from the nipple measuring 3.2 x 1.6 x 2.6 cm   08/29/2019 Initial Biopsy   Diagnosis 08/29/19 Breast, right, needle core biopsy, 12 o'clock - INVASIVE DUCTAL CARCINOMA, SEE COMMENT.   08/29/2019 Receptors her2   Results: IMMUNOHISTOCHEMICAL AND MORPHOMETRIC ANALYSIS PERFORMED MANUALLY The tumor cells are NEGATIVE for Her2 (1+). Estrogen Receptor: 100%, POSITIVE, STRONG STAINING INTENSITY Progesterone Receptor: 90%, POSITIVE, STRONG STAINING INTENSITY Proliferation Marker Ki67: 15%   09/05/2019 Initial Diagnosis   Malignant neoplasm of central portion of right breast in female, estrogen receptor positive ([place])   09/13/2019 Breast MRI   MRI breast 09/13/19 IMPRESSION: 1. Irregular spiculated enhancing mass in the right breast representing the patient's known malignancy. No abnormal right axillary lymph nodes. 2. Prominent left internal mammary lymph node is indeterminate. No abnormal appearing right internal mammary lymph nodes. 3. No evidence of malignancy in the left breast.   10/16/2019 Cancer Staging   Staging form: Breast, AJCC 8th Edition - Pathologic stage from 10/16/2019: Stage IA (pT2, pN0, cM0, G2, ER+, PR+, HER2-, Oncotype DX score: 21) - Signed by Gardenia Phlegm, NP on 11/07/2019   10/16/2019 Oncotype testing   Oncotype 10/16/19 Recurrence score 21 with 7% benefit of Tamoxifen or AI. There is less than 1 % benefit of chemo.    10/16/2019 Surgery   RIGHT BREAST LUMPECTOMY WITH SENTINEL LYMPH NODE BIOPSY by Dr Barry Dienes 10/16/19    10/16/2019 Pathology Results   FINAL MICROSCOPIC DIAGNOSIS: 10/16/19  A. BREAST, RIGHT, LUMPECTOMY:  - Invasive ductal carcinoma, 2.3 cm, Nottingham grade 2 of 3.  - Margins of resection are not involved (Closest margin: 1 mm,  anterior).  - See oncology table.   B. SENTINEL LYMPH NODE, RIGHT AXILLARY #1, BIOPSY:  - One lymph node, negative  for carcinoma (0/1).   C. SENTINEL LYMPH NODE, RIGHT AXILLARY #2, BIOPSY:  - One lymph node, negative for carcinoma (0/1).    12/03/2019 - 12/31/2019 Radiation Therapy   Adjuvant RT with Dr Isidore Moos 12/03/19-12/31/19      CURRENT THERAPY:  Anastrozole '1mg'$  daily started in Nov 2020  INTERVAL HISTORY:  Abreanna Drawdy is here for a follow up. She presents to the clinic alone. She completed adjuvant radiation for her breast cancer last week.  She tolerates it went well overall, has moderate skin burn, and moderate fatigue.  She has been working through the course of radiation.  She underwent paracentesis on November 13, 2019, and has been on diuretics since then. She has responded very well, abdominal bloating and leg swelling has resolved.  She is currently taking diuretics every other day.  She has close follow-up and lab with Dr. Henrene Pastor.  No abdominal pain, nausea, bowel movement has been normal, no hematochezia or melena, appetite fair, her weight has been stable.  Review of system otherwise negative.   MEDICAL HISTORY:  Past Medical History:  Diagnosis Date  . Breast cancer ([place]) 08/30/2019  . Cancer [facility])    liver- just diagnosed, not started treatment yet  . Cervicalgia   . COPD (chronic obstructive pulmonary disease) ([place])   . GERD (gastroesophageal reflux disease)   . Headache   . Hepatic cirrhosis ([place])   . Hepatitis C    resolved 2018  . Hepatocellular carcinoma ([place])   . IBS (irritable bowel syndrome)   . Lactose intolerance   . Portal hypertension ([place])   . Raynaud's  syndrome   . Thrombocytopenia ([place])     SURGICAL HISTORY: Past Surgical History:  Procedure Laterality Date  . BREAST LUMPECTOMY WITH AXILLARY LYMPH NODE BIOPSY Right 10/16/2019   Procedure: RIGHT BREAST LUMPECTOMY WITH SENTINEL LYMPH NODE BIOPSY;  Surgeon: Stark Klein, MD;  Location: [place];  Service: General;  Laterality: Right;  . COLONOSCOPY    . HAMMER TOE SURGERY Left 2012   pins placed in great toe,  2nd,3rd, 4th toes, all pins removed except great toe  . IR PARACENTESIS  11/13/2019  . IR RADIOLOGIST EVAL & MGMT  08/16/2019  . PELVIC LAPAROSCOPY Bilateral 1992  . UPPER GASTROINTESTINAL ENDOSCOPY  2002   had esophageal dilitation    I have reviewed the social history and family history with the patient and they are unchanged from previous note.  ALLERGIES:  is allergic to hydromorphone hcl and aspirin.  MEDICATIONS:  Current Outpatient Medications  Medication Sig Dispense Refill  . anastrozole (ARIMIDEX) 1 MG tablet Take 1 tablet (1 mg total) by mouth daily. 30 tablet 2  . dicyclomine (BENTYL) 20 MG tablet Take one tablet every 4-6 hours as needed for abdominal cramping 90 tablet 3  . furosemide (LASIX) 40 MG tablet Take 1 tablet (40 mg total) by mouth daily. 30 tablet 3  . loperamide (IMODIUM) 2 MG capsule Take 2 mg by mouth daily as needed for diarrhea or loose stools.     . pantoprazole (PROTONIX) 40 MG tablet Take 1 tablet (40 mg total) by mouth daily. 30 tablet 6  . potassium chloride SA (KLOR-CON) 20 MEQ tablet Take 1 tablet (20 mEq total) by mouth daily. 30 tablet 0  . promethazine (PHENERGAN) 12.5 MG tablet Take 1 tablet (12.5 mg total) by mouth every 6 (six) hours as needed for nausea or vomiting. 20 tablet 0  . spironolactone (ALDACTONE) 100 MG tablet Take 1 tablet (100 mg total) by mouth daily. 30 tablet 3  . baclofen (LIORESAL) 10 MG tablet Take 10 mg by mouth 2 (two) times daily as needed.    . ondansetron (ZOFRAN) 8 MG tablet Take 1 tablet (8 mg total) by mouth every 8 (eight) hours as needed for nausea or vomiting. (Patient not taking: Reported on 01/03/2020) 20 tablet 0   No current facility-administered medications for this visit.    PHYSICAL EXAMINATION: ECOG PERFORMANCE STATUS: 1 - Symptomatic but completely ambulatory  Vitals:   01/03/20 0813  BP: 107/72  Pulse: 92  Resp: 18  Temp: 98 F (36.7 C)  SpO2: 99%   Filed Weights   01/03/20 0813  Weight: 94 lb  (42.6 kg)   GENERAL:alert, no distress and comfortable SKIN: skin color, texture, turgor are normal, no rashes or significant lesions EYES: normal, Conjunctiva are pink and non-injected, sclera clear NECK: supple, thyroid normal size, non-tender, without nodularity LYMPH:  no palpable lymphadenopathy in the cervical, axillary  LUNGS: clear to auscultation and percussion with normal breathing effort HEART: regular rate & rhythm and no murmurs and no lower extremity edema ABDOMEN:abdomen soft, non-tender and normal bowel sounds Musculoskeletal:no cyanosis of digits and no clubbing  NEURO: alert & oriented x 3 with fluent speech, no focal motor/sensory deficits BREAST: Status post right breast lumpectomy, diffuse skin pigmentation and erythema from radiation, no skin ulcers or discharge.  Lateral breast and axillary exam showed no palpable mass or adenopathy.  LABORATORY DATA:  I have reviewed the data as listed CBC Latest Ref Rng & Units 01/03/2020 11/09/2019 10/16/2019  WBC 4.0 - 10.5 K/uL 5.3  5.4 6.2  Hemoglobin 12.0 - 15.0 g/dL 13.1 11.9(L) 12.0  Hematocrit 36.0 - 46.0 % 39.7 36.0 36.8  Platelets 150 - 400 K/uL 64(L) 85(L) PLATELET CLUMPS NOTED ON SMEAR, COUNT APPEARS DECREASED     CMP Latest Ref Rng & Units 12/19/2019 12/05/2019 11/21/2019  Glucose 70 - 99 mg/dL 113(H) 103(H) 97  BUN 6 - 23 mg/dL '12 11 14  '$ Creatinine 0.40 - 1.20 mg/dL 0.69 0.68 0.71  Sodium 135 - 145 mEq/L 137 136 136  Potassium 3.5 - 5.1 mEq/L 3.8 3.9 3.4(L)  Chloride 96 - 112 mEq/L 104 103 101  CO2 19 - 32 mEq/L '28 30 28  '$ Calcium 8.4 - 10.5 mg/dL 8.5 8.6 8.9  Total Protein 6.5 - 8.1 g/dL - - -  Total Bilirubin 0.3 - 1.2 mg/dL - - -  Alkaline Phos 38 - 126 U/L - - -  AST 15 - 41 U/L - - -  ALT 0 - 44 U/L - - -      RADIOGRAPHIC STUDIES: I have personally reviewed the radiological images as listed and agreed with the findings in the report. No results found.   ASSESSMENT & PLAN:  Theresa Rocha is a 54  y.o. female with    1. Malignant neoplasm of central portion of right breast, Stage II, c(T2N0M0), ER/PR+, HER2-, Grade II -She was diagnosed in 07/2019. She underwent right lumpectomy with SLNB. Path showed 2.3 cm mass was resected, clear margins and node negative. Her Oncotype showed RS 21 with no benefit form adjuvant chemo, so it was not recommended.  -She completed adjuvant RT with Dr Isidore Moos 12/03/19-12/31/19 to reduce risk of local breast cancer.  -She started adjuvant anastrozole in November 2020, has been tolerating well.  We will continue for 7 to 10 years. -Continue breast cancer surveillance, with annual mammogram, office visit with Pap every 3 to 6 months   2.Multifocal Hepatocellular carcinoma, cT2N0Mx, stage II -She initially presented with multifocal lesions of liver consistent with [place] with indeterminate right lobe lesions. Her AFP was in normal range.  -Given her history of liver disease and imaging findings, her diagnosis of HCC is quite certain.Her case was reviewed in GI tumor board and a biopsy was not felt to be needed to confirm the diagnosis.She has what appears to be early stage disease, no evidence of distant metastasis -She was seen by liver clinic [facility] to discuss liver transplant.She started workup and screening for transplant when she was diagnosed with right breast cancer.  Unfortunately the breast cancer diagnosis and treatment will likely put her liver transplant on hold.  -She was seen by Dr. Barry Dienes and Dr. Kathlene Cote, unfortunately she was felt to be not a good candidate for surgical resection of liver targeted ablation at this point due to her liver function and location of tumors.  Dr. Kathlene Cote recommended close follow-up  -We also previously discussed her case in GI tumor board, SBRT radiation could be considered if the liver targeted radiation is not feasible. -will repeat liver MRI in a month, and her follow-up with Dr. Kathlene Cote scan  3. 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  -08/09/19 Upper endoscopy did show small Varices, no bleeding.  -She is followed by Dr. Henrene Pastor.  -She on lasix and aldactone, ascites resolved clinically.  -f/u with Dr. Henrene Pastor   4. Hepatitis C -s/p harvoni and ribavirin per Dr. Bradd Burner in 2018   5. Alcohol and  smoking Cessation  -She is current every day smoker, 0.5 PPD x35 years.  -She drank 3-4 cans beer nightly x20 years. -Cessation of both was discussed and strongly encouraged.  -CT chest from 08/08/19 shows tiny b/l pulmonary nodules, which are too small to characterize, will monitor.   6. Low Weight  -I encouraged her to increase calorie, protein and carbohydrate intake wo help her gain weight before breast surgery.  -weight stable lately   7. Thrombocytopenia -Secondary to liver cirrhosis and splenomegaly -I discussed the option of thrombopoietin agonist, such as Doptelet, before her breast surgery to reduce her risk of recurrence    PLAN:  -Continue anastrozole, she is tolerating well -Lab today, including CBC, CMP, AFP -Abdominal MRI with and without contrast to monitor her [facility] in a month, will send a message to Dr. Kathlene Cote for her follow-up appointment with him after the scan -We will send a message to Dr. Henrene Pastor about her lab and f/u diuretics  -Virtual survivorship in 3 months -Lab and follow-up in 6 months      No problem-specific Assessment & Plan notes found for this encounter.   Orders Placed This Encounter  Procedures  . MR Abdomen W Wo Contrast    LIVER HCC protocol    Standing Status:   Future    Standing Expiration Date:   03/02/2021    Order Specific Question:   If indicated for the ordered procedure, I authorize the administration of contrast media per Radiology protocol    Answer:   Yes    Order Specific Question:   What is the patient's sedation requirement?    Answer:   No  Sedation    Order Specific Question:   Does the patient have a pacemaker or implanted devices?    Answer:   No    Order Specific Question:   Radiology Contrast Protocol - do NOT remove file path    Answer:   [email or web address]    Order Specific Question:   Preferred imaging location?    Answer:   [facility] (table limit-350 lbs)  . AFP tumor marker    Standing Status:   Standing    Number of Occurrences:   20    Standing Expiration Date:   01/02/2025   All questions were answered. The patient knows to call the clinic with any problems, questions or concerns. No barriers to learning was detected. The total time spent in the appointment was 30 minutes.     Truitt Merle, MD 01/03/2020   I, Joslyn Devon, am acting as scribe for Truitt Merle, MD.   I have reviewed the above documentation for accuracy and completeness, and I agree with the above.

## 2019-12-31 ENCOUNTER — Encounter: Payer: Self-pay | Admitting: *Deleted

## 2019-12-31 ENCOUNTER — Other Ambulatory Visit: Payer: Self-pay

## 2019-12-31 ENCOUNTER — Encounter: Payer: Self-pay | Admitting: Radiation Oncology

## 2019-12-31 ENCOUNTER — Ambulatory Visit
Admission: RE | Admit: 2019-12-31 | Discharge: 2019-12-31 | Disposition: A | Discharge status: Home / Self Care | Payer: BC Managed Care – PPO | Source: Ambulatory Visit | Attending: Radiation Oncology | Admitting: Radiation Oncology

## 2019-12-31 DIAGNOSIS — C50111 Malignant neoplasm of central portion of right female breast: Secondary | ICD-10-CM | POA: Insufficient documentation

## 2019-12-31 DIAGNOSIS — Z170 Estrogen receptor positive status [ER+]: Secondary | ICD-10-CM | POA: Diagnosis not present

## 2019-12-31 MED ORDER — SONAFINE EX EMUL
1.0000 "application " | Freq: Once | CUTANEOUS | Status: AC
  Administered 2019-12-31: 1 via TOPICAL

## 2020-01-01 NOTE — Progress Notes (Signed)
  Patient Name: Theresa Rocha MRN: [id number] DOB: 11-20-1966 Referring Physician: Truitt Merle (Profile Not Attached) Date of Service: 12/31/2019 [facility] [place] [place]                                                        End Of Treatment Note  Diagnoses: C50.111-Malignant neoplasm of central portion of right female breast  Cancer Staging: Cancer Staging Hepatocellular carcinoma [facility]) Staging form: Liver, AJCC 8th Edition - Clinical stage from 08/02/2019: Stage II (cT2, cN0, cM0) - Signed by Truitt Merle, MD on 08/02/2019  Malignant neoplasm of central portion of right breast in female, estrogen receptor positive ([place]) Staging form: Breast, AJCC 8th Edition - Clinical stage from 08/29/2019: Stage IB (cT2, cN0, cM0, G2, ER+, PR+, HER2-) - Signed by Truitt Merle, MD on 09/05/2019 - Pathologic stage from 10/16/2019: Stage IA (pT2, pN0, cM0, G2, ER+, PR+, HER2-, Oncotype DX score: 21) - Signed by Garden[place] Phlegm, NP on 11/07/2019   Intent: Curative  Radiation Treatment Dates: 12/03/2019 through 12/31/2019 Site Technique Total Dose (Gy) Dose per Fx (Gy) Completed Fx Beam Energies  Breast, Right: Breast_Rt 3D 40.05/40.05 2.67 15/15 6X  Breast, Right: Breast_Rt_Bst 3D 10/10 2 5/5 6X   Narrative: The patient tolerated radiation therapy relatively well.   Plan: The patient will follow-up with radiation oncology in 1 mo, or as needed. -----------------------------------  Eppie Gibson, MD

## 2020-01-03 ENCOUNTER — Other Ambulatory Visit: Payer: Self-pay

## 2020-01-03 ENCOUNTER — Encounter: Payer: Self-pay | Admitting: Hematology

## 2020-01-03 ENCOUNTER — Inpatient Hospital Stay: Payer: BC Managed Care – PPO

## 2020-01-03 ENCOUNTER — Telehealth: Payer: Self-pay | Admitting: Hematology

## 2020-01-03 ENCOUNTER — Inpatient Hospital Stay: Payer: BC Managed Care – PPO | Attending: Hematology | Admitting: Hematology

## 2020-01-03 VITALS — BP 107/72 | HR 92 | Temp 98.0°F | Resp 18 | Ht 63.0 in | Wt 94.0 lb

## 2020-01-03 DIAGNOSIS — Z170 Estrogen receptor positive status [ER+]: Secondary | ICD-10-CM

## 2020-01-03 DIAGNOSIS — C220 Liver cell carcinoma: Secondary | ICD-10-CM | POA: Insufficient documentation

## 2020-01-03 DIAGNOSIS — F17210 Nicotine dependence, cigarettes, uncomplicated: Secondary | ICD-10-CM | POA: Insufficient documentation

## 2020-01-03 DIAGNOSIS — D6959 Other secondary thrombocytopenia: Secondary | ICD-10-CM | POA: Diagnosis not present

## 2020-01-03 DIAGNOSIS — Z923 Personal history of irradiation: Secondary | ICD-10-CM | POA: Insufficient documentation

## 2020-01-03 DIAGNOSIS — C50111 Malignant neoplasm of central portion of right female breast: Secondary | ICD-10-CM

## 2020-01-03 DIAGNOSIS — Z79899 Other long term (current) drug therapy: Secondary | ICD-10-CM | POA: Diagnosis not present

## 2020-01-03 DIAGNOSIS — I8510 Secondary esophageal varices without bleeding: Secondary | ICD-10-CM | POA: Insufficient documentation

## 2020-01-03 DIAGNOSIS — C50011 Malignant neoplasm of nipple and areola, right female breast: Secondary | ICD-10-CM | POA: Diagnosis not present

## 2020-01-03 DIAGNOSIS — K766 Portal hypertension: Secondary | ICD-10-CM | POA: Diagnosis not present

## 2020-01-03 DIAGNOSIS — Z79811 Long term (current) use of aromatase inhibitors: Secondary | ICD-10-CM | POA: Insufficient documentation

## 2020-01-03 DIAGNOSIS — J449 Chronic obstructive pulmonary disease, unspecified: Secondary | ICD-10-CM | POA: Diagnosis not present

## 2020-01-03 DIAGNOSIS — B1920 Unspecified viral hepatitis C without hepatic coma: Secondary | ICD-10-CM | POA: Insufficient documentation

## 2020-01-03 DIAGNOSIS — K7031 Alcoholic cirrhosis of liver with ascites: Secondary | ICD-10-CM | POA: Insufficient documentation

## 2020-01-03 DIAGNOSIS — R162 Hepatomegaly with splenomegaly, not elsewhere classified: Secondary | ICD-10-CM | POA: Diagnosis not present

## 2020-01-03 LAB — CBC WITH DIFFERENTIAL (CANCER CENTER ONLY)
Abs Immature Granulocytes: 0.02 10*3/uL (ref 0.00–0.07)
Basophils Absolute: 0.1 10*3/uL (ref 0.0–0.1)
Basophils Relative: 1 %
Eosinophils Absolute: 0.1 10*3/uL (ref 0.0–0.5)
Eosinophils Relative: 2 %
HCT: 39.7 % (ref 36.0–46.0)
Hemoglobin: 13.1 g/dL (ref 12.0–15.0)
Immature Granulocytes: 0 %
Lymphocytes Relative: 17 %
Lymphs Abs: 0.9 10*3/uL (ref 0.7–4.0)
MCH: 30.8 pg (ref 26.0–34.0)
MCHC: 33 g/dL (ref 30.0–36.0)
MCV: 93.2 fL (ref 80.0–100.0)
Monocytes Absolute: 0.5 10*3/uL (ref 0.1–1.0)
Monocytes Relative: 10 %
Neutro Abs: 3.7 10*3/uL (ref 1.7–7.7)
Neutrophils Relative %: 70 %
Platelet Count: 64 10*3/uL — ABNORMAL LOW (ref 150–400)
RBC: 4.26 MIL/uL (ref 3.87–5.11)
RDW: 15 % (ref 11.5–15.5)
WBC Count: 5.3 10*3/uL (ref 4.0–10.5)
nRBC: 0 % (ref 0.0–0.2)

## 2020-01-03 LAB — CMP (CANCER CENTER ONLY)
ALT: 20 U/L (ref 0–44)
AST: 32 U/L (ref 15–41)
Albumin: 3.3 g/dL — ABNORMAL LOW (ref 3.5–5.0)
Alkaline Phosphatase: 122 U/L (ref 38–126)
Anion gap: 6 (ref 5–15)
BUN: 11 mg/dL (ref 6–20)
CO2: 26 mmol/L (ref 22–32)
Calcium: 8.6 mg/dL — ABNORMAL LOW (ref 8.9–10.3)
Chloride: 106 mmol/L (ref 98–111)
Creatinine: 0.75 mg/dL (ref 0.44–1.00)
GFR, Est AFR Am: >60 mL/min (ref >60)
GFR, Estimated: >60 mL/min (ref >60)
Glucose, Bld: 104 mg/dL — ABNORMAL HIGH (ref 70–99)
Potassium: 3.6 mmol/L (ref 3.5–5.1)
Sodium: 138 mmol/L (ref 135–145)
Total Bilirubin: 1.3 mg/dL — ABNORMAL HIGH (ref 0.3–1.2)
Total Protein: 7 g/dL (ref 6.5–8.1)

## 2020-01-03 NOTE — Telephone Encounter (Signed)
Scheduled appt per 2/4 los.  Sent a message to HIM pool to get a calendar mailed out. 

## 2020-01-04 LAB — AFP TUMOR MARKER: AFP, Serum, Tumor Marker: 5.9 ng/mL (ref 0.0–8.3)

## 2020-01-07 ENCOUNTER — Telehealth: Payer: Self-pay | Admitting: Internal Medicine

## 2020-01-07 NOTE — Telephone Encounter (Signed)
The pt advised that as soon as Dr Henrene Pastor receives the information from Dr Burr Medico we will call her with recommendations.  The pt has been advised of the information and verbalized understanding.

## 2020-01-18 ENCOUNTER — Other Ambulatory Visit: Payer: Self-pay | Admitting: Interventional Radiology

## 2020-01-18 ENCOUNTER — Telehealth: Payer: Self-pay

## 2020-01-18 DIAGNOSIS — C787 Secondary malignant neoplasm of liver and intrahepatic bile duct: Secondary | ICD-10-CM

## 2020-01-18 DIAGNOSIS — C50919 Malignant neoplasm of unspecified site of unspecified female breast: Secondary | ICD-10-CM

## 2020-01-18 NOTE — Telephone Encounter (Signed)
Pt aware.

## 2020-01-18 NOTE — Telephone Encounter (Signed)
-----   Message from Algernon Huxley, RN sent at 12/20/2019  9:41 AM EST ----- Regarding: bmet Needs labs, order in epic

## 2020-01-18 NOTE — Telephone Encounter (Signed)
Pt states the fluid that had been accumulating in her abdomen is not present any more and she is not gaining any weight. States [name] told her she may only need one of the diuretics she is on.

## 2020-01-18 NOTE — Telephone Encounter (Signed)
Pt had labs last week at the cancer center with [name] and per pt [name] thought her lasix/aldactone may need to be adjusted. Pt was due for bmet and wanted Dr. Henrene Pastor to review cmet from last week and advise. Pt states she is currently taking aldactone 100mg  daily and lasix 40mg  daily.

## 2020-01-18 NOTE — Telephone Encounter (Signed)
Continue both diuretics for now to avoid recurrent ascites.  Continue with regular blood work.  Repeat basic metabolic panel in 1 week

## 2020-01-18 NOTE — Telephone Encounter (Signed)
I need more information.  I reviewed the comprehensive metabolic panel from February 3, it looks fine.  Adjust diuretics in what manner?  For what reason?

## 2020-01-24 ENCOUNTER — Other Ambulatory Visit: Payer: Self-pay

## 2020-01-24 ENCOUNTER — Other Ambulatory Visit (INDEPENDENT_AMBULATORY_CARE_PROVIDER_SITE_OTHER): Payer: BC Managed Care – PPO

## 2020-01-24 DIAGNOSIS — K7460 Unspecified cirrhosis of liver: Secondary | ICD-10-CM

## 2020-01-24 LAB — BASIC METABOLIC PANEL
BUN: 11 mg/dL (ref 6–23)
CO2: 26 mEq/L (ref 19–32)
Calcium: 9 mg/dL (ref 8.4–10.5)
Chloride: 106 mEq/L (ref 96–112)
Creatinine, Ser: 0.63 mg/dL (ref 0.40–1.20)
GFR: 98.94 mL/min (ref >60.00)
Glucose, Bld: 96 mg/dL (ref 70–99)
Potassium: 3.9 mEq/L (ref 3.5–5.1)
Sodium: 138 mEq/L (ref 135–145)

## 2020-01-25 IMAGING — MG MM DIGITAL DIAGNOSTIC BILAT W/ TOMO W/ CAD
6 of 10 series · 6 of 30 positions shown · non-contrast
Comparison: Previous exam(s).

CLINICAL DATA: 53-year-old female with a palpable right breast
mass. The patient has known hepatitis C and cirrhosis and presumed
hepatocellular carcinoma and is awaiting transplant.

EXAM:
DIGITAL DIAGNOSTIC BILATERAL MAMMOGRAM WITH CAD AND TOMO
ULTRASOUND RIGHT BREAST

[L CC synth-2D]
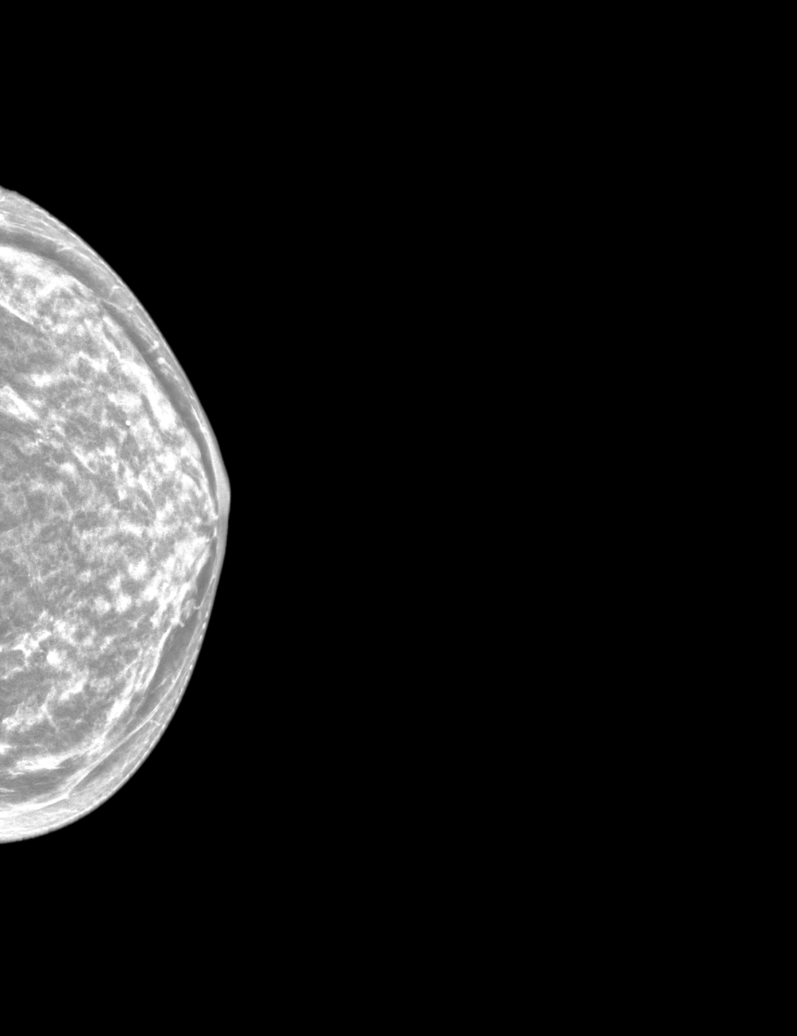

[R TAN synth-2D]
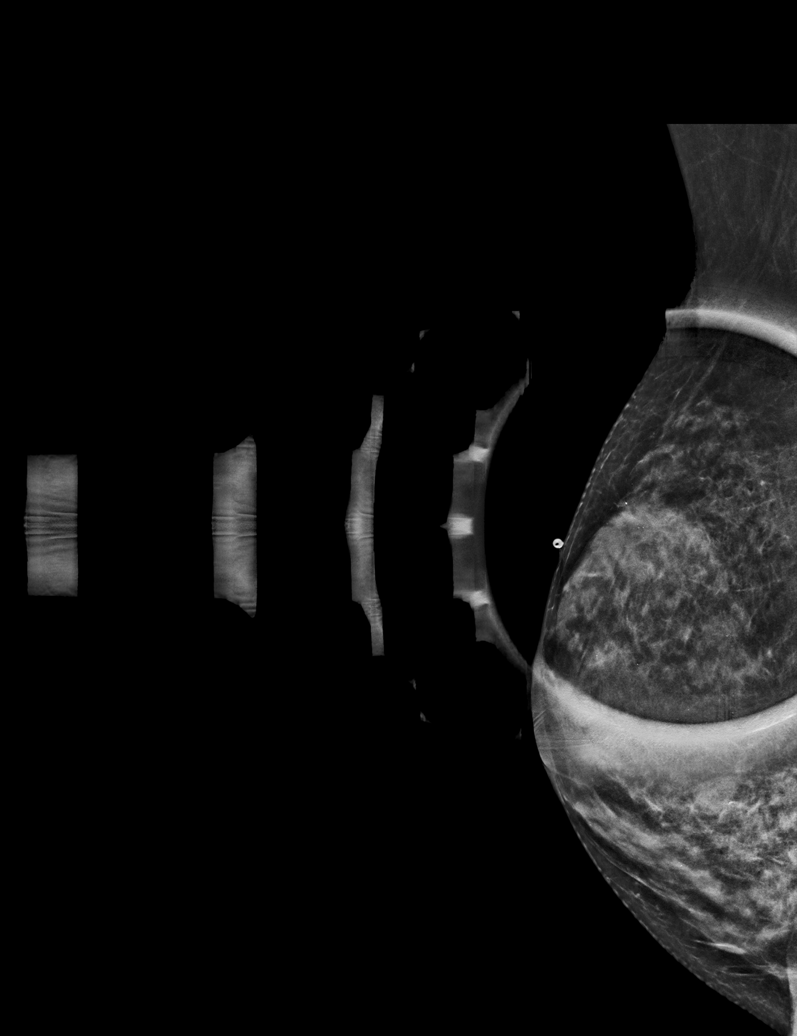

[R MLO synth-2D]
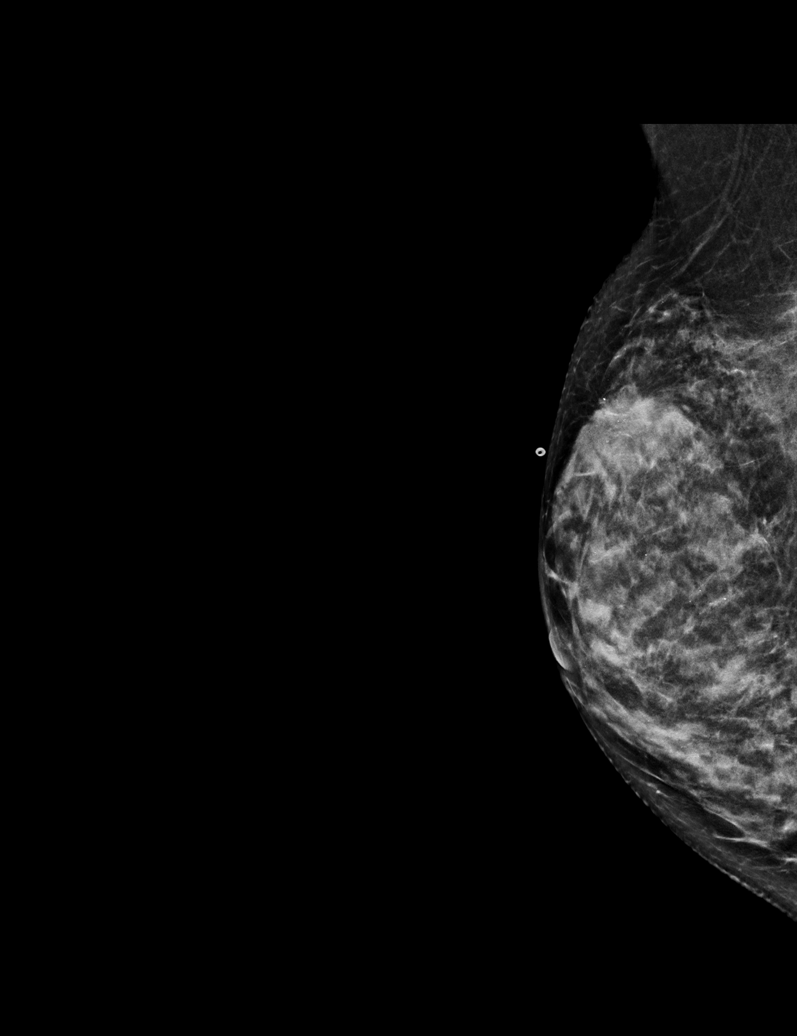

[R CC synth-2D]
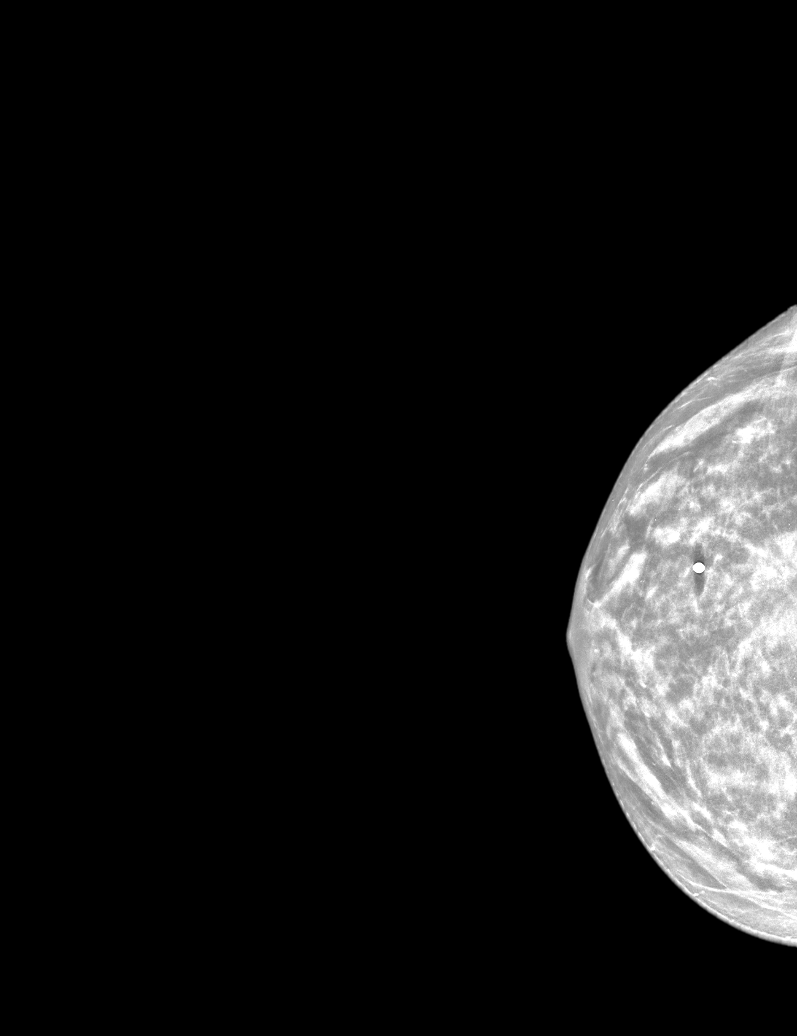

[L MLO synth-2D]
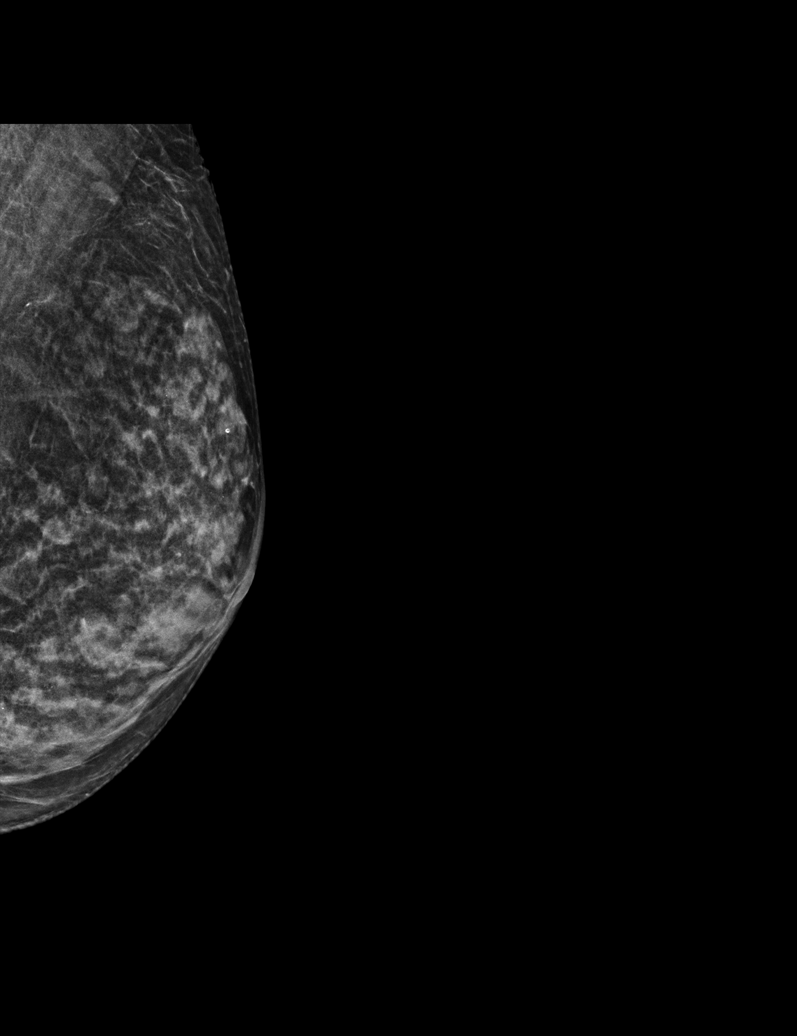

[R TAN tomo · tomo slice 23/45.0]
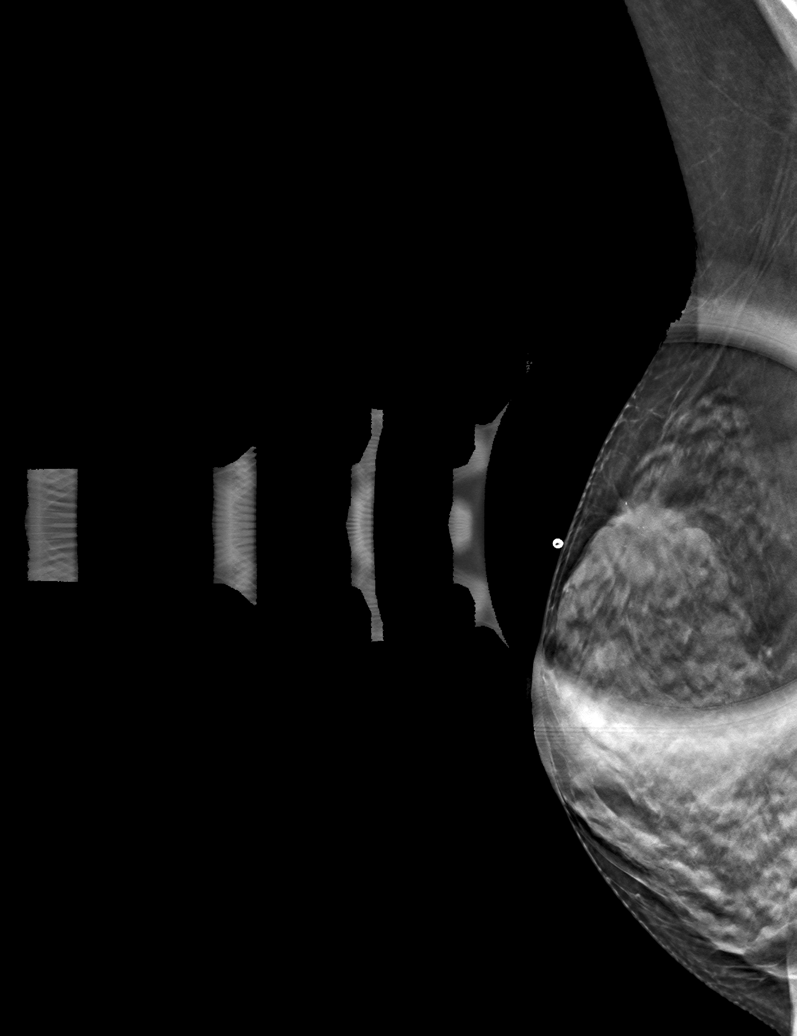

[6 of 30 positions shown; findings below may reference images not displayed]

ACR Breast Density Category c: The breast tissue is heterogeneously
dense, which may obscure small masses.
FINDINGS: No suspicious mass or malignant type microcalcifications identified
in the left breast.

In the upper slightly outer quadrant of the right breast there is a
spiculated 2.5 cm mass. Several tiny punctate calcifications are
seen in the mass. No additional mass is seen in the right breast.

Mammographic images were processed with CAD.

On physical exam, I palpate a discrete mass in the right breast at
12 o'clock 3 cm from the nipple.

Targeted ultrasound is performed, showing a lobulated hypoechoic
mass in the right breast at 12 o'clock 3 cm from the nipple
measuring 3.2 x 1.6 x 2.6 cm. Sonographic evaluation of the right
axilla does not show any enlarged adenopathy
IMPRESSION: Suspicious mass in the 12 o'clock region of the right breast.

RECOMMENDATION:
Ultrasound-guided core biopsy of the mass in the 12 o'clock region
of the right breast is recommended. The biopsy will be scheduled the
patient's convenience.

I have discussed the findings and recommendations with the patient.
If applicable, a reminder letter will be sent to the patient
regarding the next appointment.

BI-RADS CATEGORY  5: Highly suggestive of malignancy.

## 2020-01-30 ENCOUNTER — Ambulatory Visit: Payer: Self-pay | Admitting: Radiation Oncology

## 2020-02-04 ENCOUNTER — Other Ambulatory Visit: Payer: Self-pay

## 2020-02-04 ENCOUNTER — Ambulatory Visit (HOSPITAL_COMMUNITY)
Admission: RE | Admit: 2020-02-04 | Discharge: 2020-02-04 | Disposition: A | Discharge status: Home / Self Care | Payer: BC Managed Care – PPO | Source: Ambulatory Visit | Attending: Hematology | Admitting: Hematology

## 2020-02-04 DIAGNOSIS — Z170 Estrogen receptor positive status [ER+]: Secondary | ICD-10-CM | POA: Diagnosis not present

## 2020-02-04 DIAGNOSIS — C50111 Malignant neoplasm of central portion of right female breast: Secondary | ICD-10-CM

## 2020-02-04 DIAGNOSIS — K769 Liver disease, unspecified: Secondary | ICD-10-CM | POA: Diagnosis not present

## 2020-02-04 MED ORDER — GADOBUTROL 1 MMOL/ML IV SOLN
5.0000 mL | Freq: Once | INTRAVENOUS | Status: AC | PRN
  Administered 2020-02-04: 5 mL via INTRAVENOUS

## 2020-02-06 ENCOUNTER — Ambulatory Visit
Admission: RE | Admit: 2020-02-06 | Discharge: 2020-02-06 | Disposition: A | Discharge status: Home / Self Care | Payer: BC Managed Care – PPO | Source: Ambulatory Visit | Attending: Interventional Radiology | Admitting: Interventional Radiology

## 2020-02-06 ENCOUNTER — Encounter: Payer: Self-pay | Admitting: *Deleted

## 2020-02-06 DIAGNOSIS — K7689 Other specified diseases of liver: Secondary | ICD-10-CM | POA: Diagnosis not present

## 2020-02-06 DIAGNOSIS — C50919 Malignant neoplasm of unspecified site of unspecified female breast: Secondary | ICD-10-CM

## 2020-02-06 DIAGNOSIS — C787 Secondary malignant neoplasm of liver and intrahepatic bile duct: Secondary | ICD-10-CM

## 2020-02-06 HISTORY — PX: IR RADIOLOGIST EVAL & MGMT: IMG5224

## 2020-02-06 NOTE — Progress Notes (Signed)
I called the patient today about her upcoming follow-up appointment in radiation oncology.   Given the state of the COVID-19 pandemic, concerning case numbers in our community, and guidance from [facility], I offered a phone assessment with the patient to determine if coming to the clinic was necessary. She accepted.  I let the patient know that I had spoken with Dr. Isidore Moos, and she wanted them to know the importance of washing their hands for at least 20 seconds at a time, especially after going out in public, and before they eat.  Limit going out in public whenever possible. Do not touch your face, unless your hands are clean, such as when bathing. Get plenty of rest, eat well, and stay hydrated.   The patient denies any symptomatic concerns.  Specifically, they report good healing of their skin in the radiation fields.  Skin is intact.    I recommended that she continue skin care by applying oil or lotion with vitamin E to the skin in the radiation fields, BID, for 2 more months.  Continue follow-up with medical oncology - follow-up is scheduled on 04/01/20 with Cira Rue NP .  I explained that yearly mammograms are important for patients with intact breast tissue, and physical exams are important after mastectomy for patients that cannot undergo mammography.  I encouraged her to call if she had further questions or concerns about her healing. Otherwise, she will follow-up PRN in radiation oncology. Patient is pleased with this plan, and we will cancel her upcoming follow-up to reduce the risk of COVID-19 transmission.

## 2020-02-06 NOTE — Progress Notes (Signed)
Chief Complaint: Follow up of liver lesions.  History of Present Illness: Theresa Rocha is a 54 y.o. female seen previously last September for possible ablation of liver lesions suspicious for small hepatocellular carcinomas.  She has a history of chronic hepatitis C infection and cirrhosis with MRI of the abdomen on 07/26/2019 demonstrating 2 small left lobe lesions measuring approximately 1.1 cm and 1.5 cm in greatest diameter and demonstrating increased arterial phase enhancement and washout pattern consistent with small hepatocellular carcinomas (LI-RADS LR-5).  Inferiorly in the right lobe there was a 0.7 cm area of arterial enhancement that was indeterminate (LR-3).  Neither of the 2 suspicious left lobe lesions could be localized by ultrasound in the liver.  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  She underwent lumpectomy and resection of 2 negative sentinel lymph nodes by Dr. Barry Dienes in November.  She just completed radiation therapy to the right breast in early February administered by Dr. Isidore Moos. She is on adjuvant anastrozole per [name].   Theresa Rocha did have significant fatigue with radiation therapy which she is still recovering from.  She is back at work 2 days a week.  Her weight was down to as low as 89 pounds during treatment and she weighs approximately 93 pounds currently.  She is trying to supplement her diet with nutritional shakes for additional caloric intake.  She does not have any complaint of abdominal pain or distention and has not had any nausea, vomiting, melena or blood in her stool.  She is on diuretics for her previous ascites and did undergo a paracentesis on 11/13/2019 yielding 2.9 L of fluid.  She has not had any recurrence of abdominal distention since that time.  AFP remains normal with recent value of 5.9  on 01/03/2020.  A follow-up MRI of the abdomen was performed on 02/04/2020.  Past Medical History:  Diagnosis Date  . Breast cancer ([place]) 08/30/2019  . Cancer [facility])    liver- just diagnosed, not started treatment yet  . Cervicalgia   . COPD (chronic obstructive pulmonary disease) ([place])   . GERD (gastroesophageal reflux disease)   . Headache   . Hepatic cirrhosis ([place])   . Hepatitis C    resolved 2018  . Hepatocellular carcinoma ([place])   . IBS (irritable bowel syndrome)   . Lactose intolerance   . Portal hypertension ([place])   . Raynaud's syndrome   . Thrombocytopenia ([place])     Past Surgical History:  Procedure Laterality Date  . BREAST LUMPECTOMY WITH AXILLARY LYMPH NODE BIOPSY Right 10/16/2019   Procedure: RIGHT BREAST LUMPECTOMY WITH SENTINEL LYMPH NODE BIOPSY;  Surgeon: Stark Klein, MD;  Location: [facility];  Service: General;  Laterality: Right;  . COLONOSCOPY    . HAMMER TOE SURGERY Left 2012   pins placed in great toe, 2nd,3rd, 4th toes, all pins removed except great toe  . IR PARACENTESIS  11/13/2019  . IR RADIOLOGIST EVAL & MGMT  08/16/2019  . PELVIC LAPAROSCOPY Bilateral 1992  . UPPER GASTROINTESTINAL ENDOSCOPY  2002   had esophageal dilitation    Allergies: Hydromorphone hcl and Aspirin  Medications: Prior to Admission medications   Medication Sig Start Date End Date Taking? Authorizing Provider  anastrozole (ARIMIDEX) 1 MG tablet Take 1 tablet (1 mg total) by mouth daily. 12/28/19   Truitt Merle, MD  baclofen (LIORESAL) 10 MG tablet Take 10 mg by mouth 2 (two) times daily as needed. 10/31/19   [provider]  dicyclomine (BENTYL) 20 MG tablet Take one tablet every 4-6 hours as needed for abdominal cramping 11/21/19   Irene Shipper, MD  furosemide (LASIX) 40 MG tablet Take 1 tablet (40 mg total) by mouth daily. 11/09/19   Irene Shipper, MD  loperamide (IMODIUM) 2 MG capsule Take 2 mg by mouth daily as needed for diarrhea or loose stools.     [provider]   ondansetron (ZOFRAN) 8 MG tablet Take 1 tablet (8 mg total) by mouth every 8 (eight) hours as needed for nausea or vomiting. Patient not taking: Reported on 01/03/2020 08/02/19   Alla Feeling, NP  pantoprazole (PROTONIX) 40 MG tablet Take 1 tablet (40 mg total) by mouth daily. 09/26/19   Irene Shipper, MD  potassium chloride SA (KLOR-CON) 20 MEQ tablet Take 1 tablet (20 mEq total) by mouth daily. 12/19/19   Irene Shipper, MD  promethazine (PHENERGAN) 12.5 MG tablet Take 1 tablet (12.5 mg total) by mouth every 6 (six) hours as needed for nausea or vomiting. 10/16/19   Stark Klein, MD  spironolactone (ALDACTONE) 100 MG tablet Take 1 tablet (100 mg total) by mouth daily. 11/09/19   Irene Shipper, MD     Family History  Problem Relation Age of Onset  . Liver cancer Mother   . Lung cancer Mother   . Lung cancer Father   . Liver cancer Maternal Grandmother   . Colon cancer Neg Hx   . Esophageal cancer Neg Hx   . Stomach cancer Neg Hx   . Rectal cancer Neg Hx     Social History   Socioeconomic History  . Marital status: Married    Spouse name: Not on file  . Number of children: Not on file  . Years of education: Not on file  . Highest education level: Not on file  Occupational History  . Not on file  Tobacco Use  . Smoking status: Current Every Day Smoker    Packs/day: 0.50    Years: 30.00    Pack years: 15.00    Types: Cigarettes    Start date: 11/29/1978  . Smokeless tobacco: Never Used  Substance and Sexual Activity  . Alcohol use: Not Currently    Alcohol/week: 6.0 standard drinks    Types: 6 Cans of beer per week    Comment: stopped 06/2019  . Drug use: No  . Sexual activity: Not Currently    Partners: Male  Other Topics Concern  . Not on file  Social History Narrative  . Not on file   Social Determinants of Health   Financial Resource Strain:   . Difficulty of Paying Living Expenses: Not on file  Food Insecurity:   . Worried About Charity fundraiser in the Last  Year: Not on file  . Ran Out of Food in the Last Year: Not on file  Transportation Needs:   . Lack of Transportation (Medical): Not on file  . Lack of Transportation (Non-Medical): Not on file  Physical Activity:   . Days of Exercise per Week: Not on file  . Minutes of Exercise per Session: Not on file  Stress:   . Feeling of Stress : Not on file  Social Connections:   . Frequency of Communication with Friends and Family: Not on file  . Frequency of Social Gatherings with Friends and Family:  Not on file  . Attends Religious Services: Not on file  . Active Member of Clubs or Organizations: Not on file  . Attends Archivist Meetings: Not on file  . Marital Status: Not on file    ECOG Status: 1 - Symptomatic but completely ambulatory  Review of Systems: A 12 point ROS discussed and pertinent positives are indicated in the HPI above.  All other systems are negative.  Review of Systems  Constitutional: Positive for fatigue and unexpected weight change. Negative for chills and fever.  Respiratory: Negative.   Cardiovascular: Negative.   Gastrointestinal: Negative.   Genitourinary: Negative.   Skin: Negative.   Neurological: Negative.   Hematological: Negative.     Vital Signs: BP 104/62 (BP Location: Right Arm)   Pulse 83   Temp 98 F (36.7 C)   LMP 04/20/2013   SpO2 99%   Physical Exam Vitals reviewed.  Constitutional:      General: She is not in acute distress.    Appearance: Normal appearance. She is not ill-appearing, toxic-appearing or diaphoretic.  Neurological:     General: No focal deficit present.     Mental Status: She is alert and oriented to person, place, and time.     Imaging: MR Abdomen W Wo Contrast  Result Date: 02/04/2020 CLINICAL DATA:  Hepatic cirrhosis with previously identified LI-RADS category 5 lesions in the left hepatic lobe. EXAM: MRI ABDOMEN WITHOUT AND WITH CONTRAST TECHNIQUE: Multiplanar multisequence MR imaging of the abdomen  was performed both before and after the administration of intravenous contrast. Of note, the axial postcontrast HASTE imaging was not performed. CONTRAST:  73m GADAVIST GADOBUTROL 1 MMOL/ML IV SOLN COMPARISON:  07/26/2019 FINDINGS: Lower chest: Moderate right pleural effusion. Hepatobiliary: Nodular hepatic parenchyma with irregular/nodular contour. Liver measures 14 cm craniocaudal length. The previously identified lesion high in the lateral segment left liver (segment II) is stable in size measuring 1.1 x 1.0 cm today (image 20/series 16). This shows diffuse arterial phase hyperenhancement on early imaging. Previous MRI demonstrated diffuse washout of the lesion but this is not well demonstrated on today's exam, potentially related to small size, and proximity to the hemidiaphragm/respiratory motion. A 2nd segment II lesion identified previously at 1.5 x 1.3 cm now measures 1.6 x 1.2 cm, stable (28/16). This lesion also shows arterial phase hyperenhancement with diffuse washout on delayed postcontrast imaging and remains compatible with LI-RADS 5 category. The 3rd focus of subcapsular arterial phase hyperenhancement identified previously is again seen on today's study (57/16) stable at 6 mm today compared to 7 mm previously. As noted on the prior study, no definite washout is discernible in this lesion on delayed postcontrast imaging. This lesion remains compatible with LI-RADS category 3. No evidence for gallstones. Diffuse gallbladder wall thickening has decreased slightly in the interval. Pancreas: No focal mass lesion. No dilatation of the main duct. No intraparenchymal cyst. No peripancreatic edema. Spleen:  12 cm craniocaudal length.  No focal abnormality. Adrenals/Urinary Tract: No adrenal nodule or mass. Kidneys unremarkable. Stomach/Bowel: Stomach is unremarkable. No gastric wall thickening. No evidence of outlet obstruction. Duodenum is normally positioned as is the ligament of Treitz. No small bowel or  colonic dilatation within the visualized abdomen. Vascular/Lymphatic: No abdominal aortic aneurysm. There is no gastrohepatic or hepatoduodenal ligament lymphadenopathy. No retroperitoneal or mesenteric lymphadenopathy. Other: Overall decrease in volume of ascites with small intraperitoneal free fluid evident today. Musculoskeletal: No abnormal marrow enhancement within the visualized bony anatomy. IMPRESSION: Stable exam. The small segment II  lesions identified previously as LI-RADS category 5 are stable in size. Previously characterized tiny LI-RADS 3 lesion in the inferior right liver is stable. No new suspicious focal hepatic lesion on a background liver morphology consistent with cirrhosis. Interval decrease in ascites with decrease and diffuse gallbladder wall thickening. Electronically Signed   By: Misty Stanley M.D.   On: 02/04/2020 09:43    Labs:  CBC: Recent Labs    10/11/19 1101 10/16/19 0558 11/09/19 1107 01/03/20 0847  WBC 5.2 6.2 5.4 5.3  HGB 13.3 12.0 11.9* 13.1  HCT 40.2 36.8 36.0 39.7  PLT PLATELET CLUMPS NOTED ON SMEAR, COUNT APPEARS DECREASED PLATELET CLUMPS NOTED ON SMEAR, COUNT APPEARS DECREASED 85* 64*    COAGS: Recent Labs    07/26/19 1505 10/11/19 1101 11/09/19 1107  INR 1.5* 1.4* 1.3*    BMP: Recent Labs    09/06/19 1506 09/21/19 1231 10/11/19 1101 10/11/19 1101 11/09/19 1107 11/19/19 1001 12/05/19 1544 12/19/19 1530 01/03/20 0847 01/24/20 1225  NA 138   < > 138   < > 139   < > 136 137 138 138  K 3.7   < > 4.0   < > 3.1*   < > 3.9 3.8 3.6 3.9  CL 105   < > 108   < > 105   < > 103 104 106 106  CO2 26   < > 24   < > 27   < > '30 28 26 26  '$ GLUCOSE 120*   < > 96   < > 84   < > 103* 113* 104* 96  BUN 10   < > 6   < > 8   < > '11 12 11 11  '$ CALCIUM 8.1*   < > 8.6*   < > 8.1*   < > 8.6 8.5 8.6* 9.0  CREATININE 0.74   < > 0.76   < > 0.95   < > 0.68 0.69 0.75 0.63  GFRNONAA >60  --  >60  --  >60  --   --   --  >60  --   GFRAA >60  --  >60  --  >60  --    --   --  >60  --    < > = values in this interval not displayed.    LIVER FUNCTION TESTS: Recent Labs    09/06/19 1506 10/11/19 1101 11/09/19 1107 01/03/20 0847  BILITOT 1.3* 1.3* 1.0 1.3*  AST 40 37 31 32  ALT '23 24 18 20  '$ ALKPHOS 122 108 83 122  PROT 6.8 7.0 6.2* 7.0  ALBUMIN 2.9* 3.0* 2.7* 3.3*    TUMOR MARKERS: Recent Labs    07/26/19 1505  AFPTM 5.5    Assessment and Plan:  I met in person with Angeleigh and we reviewed the recent MRI study on 02/04/2020. This demonstrates stable appearance of the 1.1 cm lesion in the superior aspect of segment II of the left lobe and the 1.5-1.6 cm segment II lesion, both of which demonstrate enhancement early in the arterial phase with eventual washout.  These were both previously considered LI-RADS 5 lesions.  The smaller 1.1 cm lesion does not demonstrate as convincing washout on the current study.  The third small subcapsular area of enhancement in the right lobe measures approximately 6 mm and remains a LI-RADS 3 lesion.  Given stability of the small lesions over nearly 6 months and no rise in AFP, I recommended continued close surveillance by MRI before  making a decision regarding treatment.  It is reassuring that the lesions appear to demonstrate absolutely no growth over 6 months.  The current MRI also demonstrates no ascites and a small right pleural effusion.  The pleural effusion may be secondary to the recent breast radiation therapy and is currently not causing any symptoms.  I recommended a follow-up MRI of the abdomen with and without gadolinium in 3 to 4 months.   Electronically Signed: Azzie Roup 02/06/2020, 12:07 PM     I spent a total of 15 Minutes in face to face in clinical consultation, greater than 50% of which was counseling/coordinating care for possible ablation of liver lesions.

## 2020-02-07 MED FILL — ANASTROZOLE 1 MG TABLET: 1 | 30 days supply | Qty: 30 | Fill #1

## 2020-02-08 ENCOUNTER — Ambulatory Visit
Admission: RE | Admit: 2020-02-08 | Discharge: 2020-02-08 | Disposition: A | Discharge status: Home / Self Care | Payer: BC Managed Care – PPO | Source: Ambulatory Visit | Attending: Radiation Oncology | Admitting: Radiation Oncology

## 2020-02-15 ENCOUNTER — Telehealth: Payer: Self-pay | Admitting: Internal Medicine

## 2020-02-15 MED ORDER — POTASSIUM CHLORIDE CRYS ER 20 MEQ PO TBCR: 20.0000 meq | EXTENDED_RELEASE_TABLET | Freq: Every day | ORAL | 0 refills | Status: DC

## 2020-02-15 NOTE — Telephone Encounter (Signed)
Theresa Rocha, does this patient need to continue potassium? I see the rx was only given for 30 days. Or does she need repeat labs?

## 2020-02-15 NOTE — Telephone Encounter (Signed)
Refill sent for a 30 days supply only. Left a detailed message on patients voicemail regarding Rx and lab work

## 2020-02-15 NOTE — Telephone Encounter (Signed)
Pt would like a refill for potassium.  She requested brand name because the tablets are smaller.

## 2020-02-15 NOTE — Telephone Encounter (Signed)
Pt had a BMET done on 01/24/20 and was supposed to have repeat labs in 4 weeks. She is due for labs next week. Probably needs a refill until we see next lab results.

## 2020-02-20 ENCOUNTER — Other Ambulatory Visit (INDEPENDENT_AMBULATORY_CARE_PROVIDER_SITE_OTHER): Payer: BC Managed Care – PPO

## 2020-02-20 DIAGNOSIS — K7469 Other cirrhosis of liver: Secondary | ICD-10-CM | POA: Diagnosis not present

## 2020-02-20 DIAGNOSIS — C220 Liver cell carcinoma: Secondary | ICD-10-CM | POA: Diagnosis not present

## 2020-02-20 DIAGNOSIS — R188 Other ascites: Secondary | ICD-10-CM | POA: Diagnosis not present

## 2020-02-20 DIAGNOSIS — K7460 Unspecified cirrhosis of liver: Secondary | ICD-10-CM

## 2020-02-20 LAB — BASIC METABOLIC PANEL
BUN: 9 mg/dL (ref 6–23)
CO2: 24 mEq/L (ref 19–32)
Calcium: 8.3 mg/dL — ABNORMAL LOW (ref 8.4–10.5)
Chloride: 106 mEq/L (ref 96–112)
Creatinine, Ser: 0.59 mg/dL (ref 0.40–1.20)
GFR: 106.68 mL/min (ref >60.00)
Glucose, Bld: 152 mg/dL — ABNORMAL HIGH (ref 70–99)
Potassium: 3.7 mEq/L (ref 3.5–5.1)
Sodium: 136 mEq/L (ref 135–145)

## 2020-02-21 ENCOUNTER — Other Ambulatory Visit: Payer: Self-pay

## 2020-02-21 DIAGNOSIS — K7460 Unspecified cirrhosis of liver: Secondary | ICD-10-CM

## 2020-03-07 DIAGNOSIS — K7469 Other cirrhosis of liver: Secondary | ICD-10-CM | POA: Diagnosis not present

## 2020-03-10 ENCOUNTER — Other Ambulatory Visit: Payer: Self-pay | Admitting: Internal Medicine

## 2020-03-14 MED FILL — ANASTROZOLE 1 MG TABLET: 1 | 30 days supply | Qty: 30 | Fill #2

## 2020-03-26 ENCOUNTER — Telehealth: Payer: Self-pay

## 2020-03-26 NOTE — Telephone Encounter (Signed)
-----   Message from Algernon Huxley, RN sent at 02/21/2020  8:32 AM EDT ----- Regarding: lab BMET needed, order in epic

## 2020-03-26 NOTE — Telephone Encounter (Signed)
Pt notified of need for BMET, orders in epic.

## 2020-03-28 ENCOUNTER — Other Ambulatory Visit (INDEPENDENT_AMBULATORY_CARE_PROVIDER_SITE_OTHER): Payer: BC Managed Care – PPO

## 2020-03-28 DIAGNOSIS — K7460 Unspecified cirrhosis of liver: Secondary | ICD-10-CM | POA: Diagnosis not present

## 2020-03-28 LAB — BASIC METABOLIC PANEL
BUN: 11 mg/dL (ref 6–23)
CO2: 25 mEq/L (ref 19–32)
Calcium: 8.6 mg/dL (ref 8.4–10.5)
Chloride: 111 mEq/L (ref 96–112)
Creatinine, Ser: 0.72 mg/dL (ref 0.40–1.20)
GFR: 84.75 mL/min (ref >60.00)
Glucose, Bld: 109 mg/dL — ABNORMAL HIGH (ref 70–99)
Potassium: 3.9 mEq/L (ref 3.5–5.1)
Sodium: 141 mEq/L (ref 135–145)

## 2020-03-31 ENCOUNTER — Other Ambulatory Visit: Payer: Self-pay

## 2020-03-31 DIAGNOSIS — K7460 Unspecified cirrhosis of liver: Secondary | ICD-10-CM

## 2020-04-01 ENCOUNTER — Encounter: Payer: Self-pay | Admitting: Nurse Practitioner

## 2020-04-01 ENCOUNTER — Inpatient Hospital Stay: Payer: BC Managed Care – PPO | Attending: Hematology | Admitting: Nurse Practitioner

## 2020-04-01 DIAGNOSIS — Z170 Estrogen receptor positive status [ER+]: Secondary | ICD-10-CM

## 2020-04-01 DIAGNOSIS — C50111 Malignant neoplasm of central portion of right female breast: Secondary | ICD-10-CM | POA: Diagnosis not present

## 2020-04-01 NOTE — Progress Notes (Signed)
Patient Care Team: Seward Carol, MD as PCP - General (Internal Medicine) Arna Snipe, RN (Inactive) as Oncology Nurse Navigator Irene Shipper, MD as Consulting Physician (Gastroenterology) Truitt Merle, MD as Consulting Physician (Hematology) Alla Feeling, NP as Nurse Practitioner (Nurse Practitioner) Stark Klein, MD as Consulting Physician (General Surgery) Aletta Edouard, MD as Consulting Physician (Interventional Radiology) Roosevelt Locks, CRNP as Nurse Practitioner (Nurse Practitioner) Mauro Kaufmann, RN as Oncology Nurse Navigator Rockwell Germany, RN as Oncology Nurse Navigator Eppie Gibson, MD as Attending Physician (Radiation Oncology)   CLINIC: Survivorship   I connected with Theresa Rocha. Theresa Rocha on 04/01/20 at 12:20 PM EDT by Mychart video-enabled platform and verified that I am speaking with the correct person using two identifiers.  I discussed the limitations, risks, security and privacy concerns of performing an evaluation and management service by telephone and the availability of in person appointments. I also discussed with the patient that there may be a patient responsible charge related to this service. The patient expressed understanding and agreed to proceed.   Others included in today's visit: None Patient's location: Home Provider's location: [place] office   BRIEF ONCOLOGIC HISTORY:  Oncology History Overview Note  Cancer Staging Hepatocellular carcinoma ([place]) Staging form: Liver, AJCC 8th Edition - Clinical stage from 08/02/2019: Stage II (cT2, cN0, cM0) - Signed by Truitt Merle, MD on 08/02/2019  Malignant neoplasm of central portion of right breast in female, estrogen receptor positive ([place]) Staging form: Breast, AJCC 8th Edition - Clinical stage from 08/29/2019: Stage IB (cT2, cN0, cM0, G2, ER+, PR+, HER2-) - Signed by Truitt Merle, MD on 09/05/2019    Hepatocellular carcinoma ([place])  07/04/2019 Imaging   CT AP IMPRESSION: 1. Findings of cirrhosis and hepatomegaly.   Patent portal vein. 2. Numerous varicosities as described above. 3. Moderate to large amount of abdominopelvic ascites 4. Contrast filled appendix which appears to be a mildly dilated with apparent wall thickening which could be due to surrounding ascites.   07/09/2019 Imaging   ABD US IMPRESSION: 1. Three small hypoechoic lesions in the RIGHT hepatic lobe measuring less than 1 cm but not evident on comparison contrast enhanced CT from 07/04/2019. Recommend MRI of the abdomen without and with contrast to evaluate these hepatic lesions in cirrhotic patient. 2. Increased liver echogenicity and lobular contour consistent cirrhosis. 3. Moderate mild to moderate volume of ascites. 4. Mild gallbladder wall thickening likely related to ascites. 5. No cholelithiasis or cholecystitis evident   07/26/2019 Imaging   MR ABD IMPRESSION: 1. In the lateral segment left hepatic lobe there are two LI-RADS category LR-5 lesions representing hepatocellular carcinoma. 2. There is also a LI-RADS category LR-3 lesion peripherally in the right hepatic lobe, intermediate probability for hepatocellular carcinoma. 3. Hepatic cirrhosis and diffuse wall thickening of the gallbladder. Widespread steatosis in the liver. 4. 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 5.  Aortic Atherosclerosis (ICD10-I70.0). 6. Widespread ascites and mesenteric edema.   07/26/2019 Tumor Marker   AFP 5.5 (baseline)   08/02/2019 Initial Diagnosis   Hepatocellular carcinoma ([place])   08/02/2019 Cancer Staging   Staging form: Liver, AJCC 8th Edition - Clinical stage from 08/02/2019: Stage II (cT2, cN0, cM0) - Signed by Truitt Merle, MD on 08/02/2019   08/07/2019 Imaging   CT Chest 08/07/19 IMPRESSION: 1. Tiny bilateral pulmonary nodules measuring up to 6 mm. These are too small to definitively characterize. Given the history of HCC, close follow-up recommended to ensure stability. 2.  Emphysema. (ICD10-J43.9)    08/09/2019 Procedure  Upper Endoscopy by Dr Henrene Pastor 08/09/19  IMPRESSION 1. Small esophageal varices 2. Mild diffuse portal hypertensive gastropathy 3. Otherwise normal EGD.   Malignant neoplasm of central portion of right breast in female, estrogen receptor positive ([place])  08/29/2019 Cancer Staging   Staging form: Breast, AJCC 8th Edition - Clinical stage from 08/29/2019: Stage IB (cT2, cN0, cM0, G2, ER+, PR+, HER2-) - Signed by Truitt Merle, MD on 09/05/2019   08/29/2019 Mammogram   Diagnostic mammogram 08/29/19 IMPRESSION: Suspicious mass in the 12 o'clock region of the right breast 3 cm from the nipple measuring 3.2 x 1.6 x 2.6 cm   08/29/2019 Initial Biopsy   Diagnosis 08/29/19 Breast, right, needle core biopsy, 12 o'clock - INVASIVE DUCTAL CARCINOMA, SEE COMMENT.   08/29/2019 Receptors her2   Results: IMMUNOHISTOCHEMICAL AND MORPHOMETRIC ANALYSIS PERFORMED MANUALLY The tumor cells are NEGATIVE for Her2 (1+). Estrogen Receptor: 100%, POSITIVE, STRONG STAINING INTENSITY Progesterone Receptor: 90%, POSITIVE, STRONG STAINING INTENSITY Proliferation Marker Ki67: 15%   09/05/2019 Initial Diagnosis   Malignant neoplasm of central portion of right breast in female, estrogen receptor positive ([place])   09/13/2019 Breast MRI   MRI breast 09/13/19 IMPRESSION: 1. Irregular spiculated enhancing mass in the right breast representing the patient's known malignancy. No abnormal right axillary lymph nodes. 2. Prominent left internal mammary lymph node is indeterminate. No abnormal appearing right internal mammary lymph nodes. 3. No evidence of malignancy in the left breast.   10/16/2019 Cancer Staging   Staging form: Breast, AJCC 8th Edition - Pathologic stage from 10/16/2019: Stage IA (pT2, pN0, cM0, G2, ER+, PR+, HER2-, Oncotype DX score: 21) - Signed by Gardenia Phlegm, NP on 11/07/2019   10/16/2019 Oncotype testing   Oncotype 10/16/19 Recurrence score 21 with 7% benefit of  Tamoxifen or AI. There is less than 1 % benefit of chemo.    10/16/2019 Surgery   RIGHT BREAST LUMPECTOMY WITH SENTINEL LYMPH NODE BIOPSY by Dr Barry Dienes 10/16/19    10/16/2019 Pathology Results   FINAL MICROSCOPIC DIAGNOSIS: 10/16/19  A. BREAST, RIGHT, LUMPECTOMY:  - Invasive ductal carcinoma, 2.3 cm, Nottingham grade 2 of 3.  - Margins of resection are not involved (Closest margin: 1 mm,  anterior).  - See oncology table.   B. SENTINEL LYMPH NODE, RIGHT AXILLARY #1, BIOPSY:  - One lymph node, negative for carcinoma (0/1).   C. SENTINEL LYMPH NODE, RIGHT AXILLARY #2, BIOPSY:  - One lymph node, negative for carcinoma (0/1).    09/2019 -  Anti-estrogen oral therapy   Anastrozole 1 mg po once daily starting 09/2019    12/03/2019 - 12/31/2019 Radiation Therapy   Adjuvant RT with Dr Isidore Moos 12/03/19-12/31/19   04/01/2020 Survivorship   SCP delivered by virtual visit per Cira Rue, NP      INTERVAL HISTORY:  Ms. Bridge to review her survivorship care plan detailing her treatment course for breast cancer, as well as monitoring long-term side effects of that treatment, education regarding health maintenance, screening, and overall wellness and health promotion.     Overall, Ms. Sheldon reports feeling well overall. She continues anastrozole which she started in 09/2019. She has mild hot flashes at night and moodiness that presents as irritability in the afternoon especially if she is fatigued. Her main complaint is joint pain in her knees that requires her to use assistance to stand up after bending at the knees, like when doing laundry low to the ground. This is moderately interfering but manageable. She is not taking pain meds due to liver function.  She remains functional and walks. Otherwise, denies recent fever, infection, change in bowel habits, rectal or vaginal bleeding, decreased appetite, weight loss, new/increased abdominal pain or bloating, cough, chest pain, dyspnea, leg swelling, or new  nodularity, mass, tenderness, nipple change or discharge.     ONCOLOGY TREATMENT TEAM:  1. Surgeon:  Dr. Barry Dienes at [facility] 2. Medical Oncologist: [name]  3. Radiation Oncologist: Dr. Isidore Moos     PAST MEDICAL/SURGICAL HISTORY:  Past Medical History:  Diagnosis Date  . Breast cancer ([place]) 08/30/2019  . Cancer [facility])    liver- just diagnosed, not started treatment yet  . Cervicalgia   . COPD (chronic obstructive pulmonary disease) ([place])   . GERD (gastroesophageal reflux disease)   . Headache   . Hepatic cirrhosis ([employer])   . Hepatitis C    resolved 2018  . Hepatocellular carcinoma ([place])   . IBS (irritable bowel syndrome)   . Lactose intolerance   . Portal hypertension ([place])   . Raynaud's syndrome   . Thrombocytopenia ([place])    Past Surgical History:  Procedure Laterality Date  . BREAST LUMPECTOMY WITH AXILLARY LYMPH NODE BIOPSY Right 10/16/2019   Procedure: RIGHT BREAST LUMPECTOMY WITH SENTINEL LYMPH NODE BIOPSY;  Surgeon: Stark Klein, MD;  Location: [facility];  Service: General;  Laterality: Right;  . COLONOSCOPY    . HAMMER TOE SURGERY Left 2012   pins placed in great toe, 2nd,3rd, 4th toes, all pins removed except great toe  . IR PARACENTESIS  11/13/2019  . IR RADIOLOGIST EVAL & MGMT  08/16/2019  . IR RADIOLOGIST EVAL & MGMT  02/06/2020  . PELVIC LAPAROSCOPY Bilateral 1992  . UPPER GASTROINTESTINAL ENDOSCOPY  2002   had esophageal dilitation     ALLERGIES:  Allergies  Allergen Reactions  . Hydromorphone Hcl Nausea And Vomiting  . Aspirin Nausea Only     CURRENT MEDICATIONS:  Outpatient Encounter Medications as of 04/01/2020  Medication Sig  . anastrozole (ARIMIDEX) 1 MG tablet Take 1 tablet (1 mg total) by mouth daily.  . furosemide (LASIX) 40 MG tablet Take 1 tablet (40 mg total) by mouth daily.  Marland Kitchen KLOR-CON M20 20 MEQ tablet TAKE 1 TABLET BY MOUTH EVERY DAY  . loperamide (IMODIUM) 2 MG capsule Take 2 mg by mouth daily as needed for diarrhea or loose  stools.   . promethazine (PHENERGAN) 12.5 MG tablet Take 1 tablet (12.5 mg total) by mouth every 6 (six) hours as needed for nausea or vomiting.  Marland Kitchen spironolactone (ALDACTONE) 100 MG tablet Take 1 tablet (100 mg total) by mouth daily.  . baclofen (LIORESAL) 10 MG tablet Take 10 mg by mouth 2 (two) times daily as needed.  . dicyclomine (BENTYL) 20 MG tablet Take one tablet every 4-6 hours as needed for abdominal cramping  . ondansetron (ZOFRAN) 8 MG tablet Take 1 tablet (8 mg total) by mouth every 8 (eight) hours as needed for nausea or vomiting. (Patient not taking: Reported on 01/03/2020)  . pantoprazole (PROTONIX) 40 MG tablet Take 1 tablet (40 mg total) by mouth daily.   No facility-administered encounter medications on file as of 04/01/2020.     ONCOLOGIC FAMILY HISTORY:  Family History  Problem Relation Age of Onset  . Liver cancer Mother   . Lung cancer Mother   . Lung cancer Father   . Liver cancer Maternal Grandmother   . Colon cancer Neg Hx   . Esophageal cancer Neg Hx   . Stomach cancer Neg Hx   . Rectal  cancer Neg Hx      GENETIC COUNSELING/TESTING: None  SOCIAL HISTORY:  Social History   Socioeconomic History  . Marital status: Married    Spouse name: Not on file  . Number of children: Not on file  . Years of education: Not on file  . Highest education level: Not on file  Occupational History  . Not on file  Tobacco Use  . Smoking status: Current Every Day Smoker    Packs/day: 0.50    Years: 30.00    Pack years: 15.00    Types: Cigarettes    Start date: 11/29/1978  . Smokeless tobacco: Never Used  Substance and Sexual Activity  . Alcohol use: Not Currently    Alcohol/week: 6.0 standard drinks    Types: 6 Cans of beer per week    Comment: stopped 06/2019  . Drug use: No  . Sexual activity: Not Currently    Partners: Male  Other Topics Concern  . Not on file  Social History Narrative  . Not on file   Social Determinants of Health   Financial Resource  Strain:   . Difficulty of Paying Living Expenses:   Food Insecurity:   . Worried About Charity fundraiser in the Last Year:   . Arboriculturist in the Last Year:   Transportation Needs:   . Film/video editor (Medical):   Marland Kitchen Lack of Transportation (Non-Medical):   Physical Activity:   . Days of Exercise per Week:   . Minutes of Exercise per Session:   Stress:   . Feeling of Stress :   Social Connections:   . Frequency of Communication with Friends and Family:   . Frequency of Social Gatherings with Friends and Family:   . Attends Religious Services:   . Active Member of Clubs or Organizations:   . Attends Archivist Meetings:   Marland Kitchen Marital Status:   Intimate Partner Violence:   . Fear of Current or Ex-Partner:   . Emotionally Abused:   Marland Kitchen Physically Abused:   . Sexually Abused:      OBSERVATIONS/OBJECTIVE:  Patient appears well via video platform. Speech is clear. Normal respiratory effort. Mood appears appropriate.   LABORATORY DATA:  None for this visit.  DIAGNOSTIC IMAGING:  None for this visit.      ASSESSMENT AND PLAN:  Ms.. Slaven is a pleasant 54 y.o. female with Stage IA right breast invasive ductal carcinoma, ER+/PR+/HER2-, diagnosed in 08/2019, treated with lumpectomy, adjuvant radiation therapy, and anti-estrogen therapy with Anastrozole beginning pre-operatively in 09/2019.  She presents to the Survivorship Clinic for our initial meeting and routine follow-up post-completion of treatment for breast cancer.    1. Stage IA right breast cancer:  Ms. Erdman is continuing to recover from definitive treatment for breast cancer. She will follow-up with her medical oncologist, [name] in 06/2020 with history and physical exam per surveillance protocol.  She will continue her anti-estrogen therapy with Anastrozole. Thus far, she is tolerating it moderately well with mild hot flash, moodiness, and mild to moderate knee pain. Symptoms are manageable and prefers to  continue anastrozole for now. We reviewed if symptoms worsen or fail to improve, we can consider switching her to an alternative agent such as exemestane or tamoxifen. She was instructed to make [name] or myself aware if she begins to experience any worsening side effects of the medication and I could see her back in clinic to help manage those side effects, as needed. Her  mammogram is due in 07/2020; orders placed today.  Her breast density is category C. Today, a comprehensive survivorship care plan and treatment summary was reviewed with the patient today detailing her breast cancer diagnosis, treatment course, potential late/long-term effects of treatment, appropriate follow-up care with recommendations for the future, and patient education resources.  A copy of this summary, along with a letter will be sent to the patient's primary care provider via In Basket message after today's visit.    2. Hot flashes, moodiness, joint pain (knees): related to anastrozole - She has been tolerating well since starting in 09/2019. Symptoms are mild to moderate, overall manageable. She prefers to continue anastrozole for now. Will follow closely. I encouraged her to remain active, eat and drink well, and promote healthy lifestyle for mental and physical health.   3. Bone health:  Given Ms. Peake age/history of breast cancer and her current treatment regimen including anti-estrogen therapy with anastrozole, she is at risk for bone demineralization.  Her last DEXA scan was [facility], and recalls she was diagnosed with osteoporosis. Will obtain a copy of the report to determine whether to continue AI or change to tamoxifen. In the meantime I encouraged her to increase consumption of foods rich in calcium, begin calcium/vitamin D supplement, as well as increase her weight-bearing activities.  She was given education on specific activities to promote bone health.  4. Cancer screening:  Due  to Ms. Nam history and her age, she should receive screening for skin cancers, colon cancer, and gynecologic cancers.  The information and recommendations are listed on the patient's comprehensive care plan/treatment summary and were reviewed in detail with the patient.    5. Health maintenance and wellness promotion: Ms. Pack was encouraged to consume 5-7 servings of fruits and vegetables per day. We reviewed the "Nutrition Rainbow" handout, as well as the handout "Take Control of Your Health and Reduce Your Cancer Risk" from the [employer].  She was also encouraged to engage in moderate to vigorous exercise for 30 minutes per day most days of the week. We discussed the [employer] fitness program, which is designed for cancer survivors to help them become more physically fit after cancer treatments.  She was instructed to limit her alcohol consumption and was encouraged stop smoking.     6. Support services/counseling: It is not uncommon for this period of the patient's cancer care trajectory to be one of many emotions and stressors.  We discussed how this can be increasingly difficult during the times of quarantine and social distancing due to the COVID-19 pandemic.   She was given information regarding our available services and encouraged to contact me with any questions or for help enrolling in any of our support group/programs.    Follow up instructions:    -Return to cancer center 06/2020  -Mammogram due in [address]21 -Follow up with surgery as indicated -DEXA - will [facility] report from [facility]  -She is welcome to return back to the Survivorship Clinic at any time; no additional follow-up needed at this time.  -Consider referral back to survivorship as a long-term survivor for continued surveillance The patient was provided an opportunity to ask questions and all were answered. The patient agreed with the plan and demonstrated an understanding of the instructions.   The patient was advised to call back or seek an in-person evaluation if the symptoms worsen or if the condition fails to improve as anticipated.  I provided 30 minutes of face-to-face time during this video-enabled encounter.    Orders Placed This Encounter  Procedures  . MM DIAG BREAST TOMO BILATERAL    Standing Status:   Future    Standing Expiration Date:   04/01/2021    Order Specific Question:   Reason for Exam (SYMPTOM  OR DIAGNOSIS REQUIRED)    Answer:   h/o right breast cancer s/p lumpectomy 09/2019 and radiation    Order Specific Question:   Is the patient pregnant?    Answer:   No    Order Specific Question:   Preferred imaging location?    Answer:   [place], NP

## 2020-04-09 ENCOUNTER — Telehealth: Payer: Self-pay | Admitting: Nurse Practitioner

## 2020-04-09 DIAGNOSIS — C50111 Malignant neoplasm of central portion of right female breast: Secondary | ICD-10-CM

## 2020-04-09 DIAGNOSIS — Z170 Estrogen receptor positive status [ER+]: Secondary | ICD-10-CM

## 2020-04-09 MED ORDER — TAMOXIFEN CITRATE 20 MG PO TABS: 20.0000 mg | ORAL_TABLET | Freq: Every day | ORAL | 1 refills | Status: DC

## 2020-04-09 NOTE — Telephone Encounter (Signed)
I called Ms. Rey to f/u her worsening knee joint pain. This is interfering with her ADLs that require bending down, laundry, etc. She tries to avoid pain medication due to her [place]. I received her D[facility] from [facility] performed on 09/21/19 which showed osteoporosis T score -3.1 at [facility] spine. Given the above, I am recommending to change her endocrine therapy from AI to tamoxifen. I reviewed potential SE's including but not limited to hot flash, weight gain, insomnia, thrombosis, CAD, gyn malignancies. She has no h/o blood clots or endometrial cancer. She agrees to proceed.   She will stop anastrozole now and start Tamoxifen in approx 2 weeks when her joint pain improves. I sent in new Rx to her pharmacy. She will return at her previously scheduled visit in 06/2020, or sooner if needed. She knows to call if her symptoms worsen or fail to improve, or she has new concerns.   Cira Rue, NP

## 2020-04-10 IMAGING — US IR PARACENTESIS
1 series · 2 of 2 positions shown · non-contrast
Comparison: None.

MEDICATIONS:
10 cc 1% lidocaine

COMPLICATIONS:
None immediate.

INDICATION: Cirrhosis; ascites

EXAM:
ULTRASOUND-GUIDED PARACENTESIS
TECHNIQUE: Informed written consent was obtained from the patient after a
discussion of the risks, benefits and alternatives to treatment. A
timeout was performed prior to the initiation of the procedure.

[Series 1: (id) · 2 of 2 slices shown]
[im 1/2]
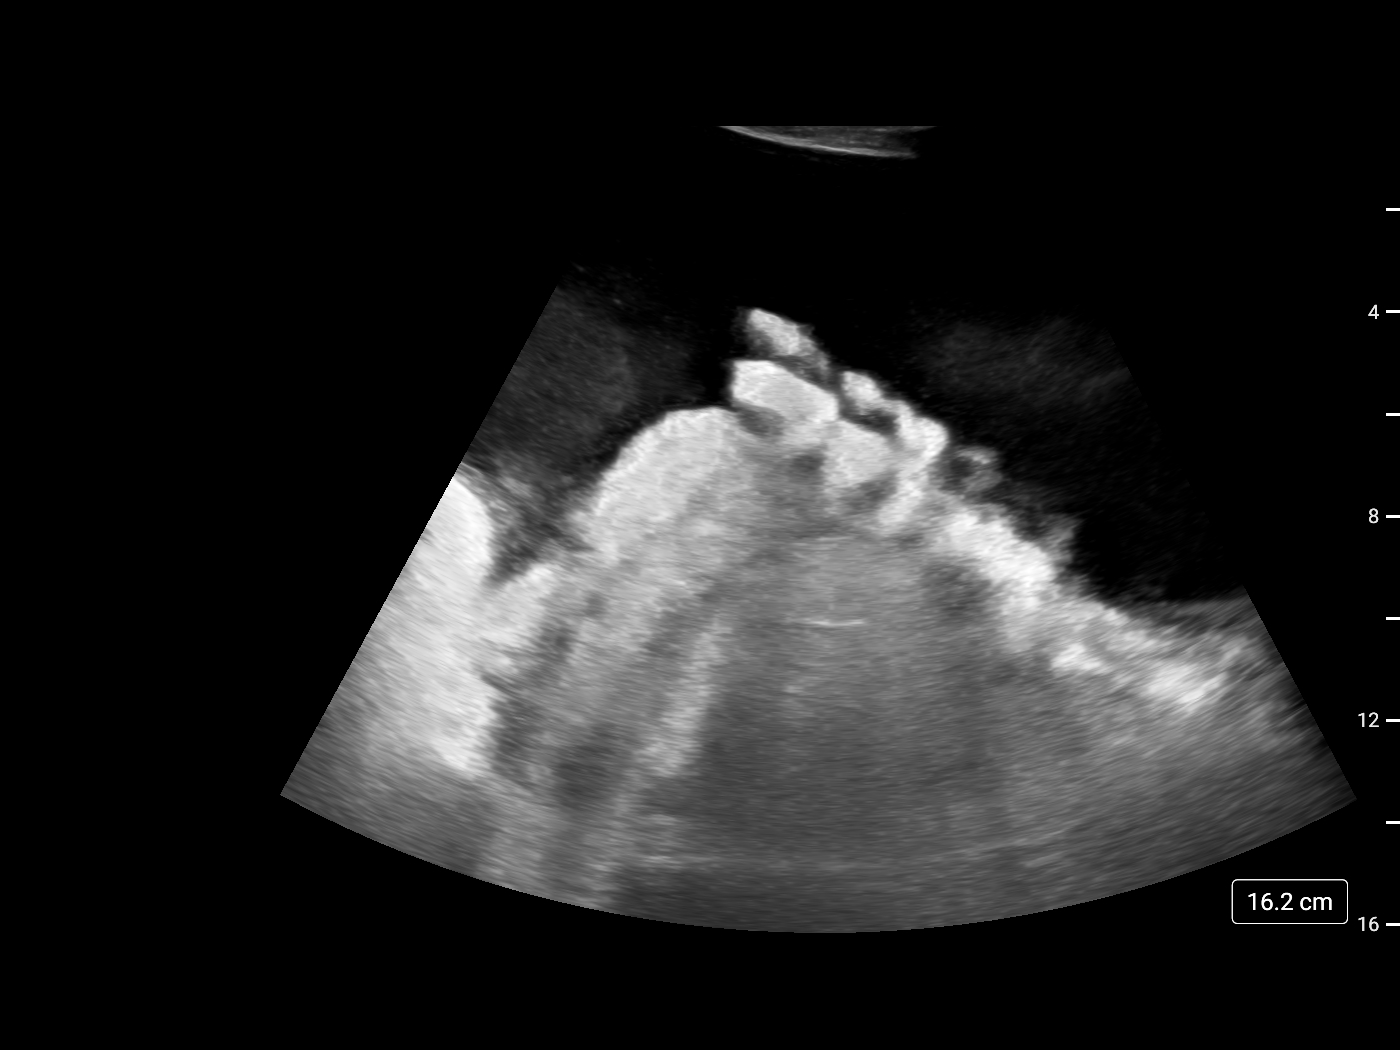
[im 2/2]
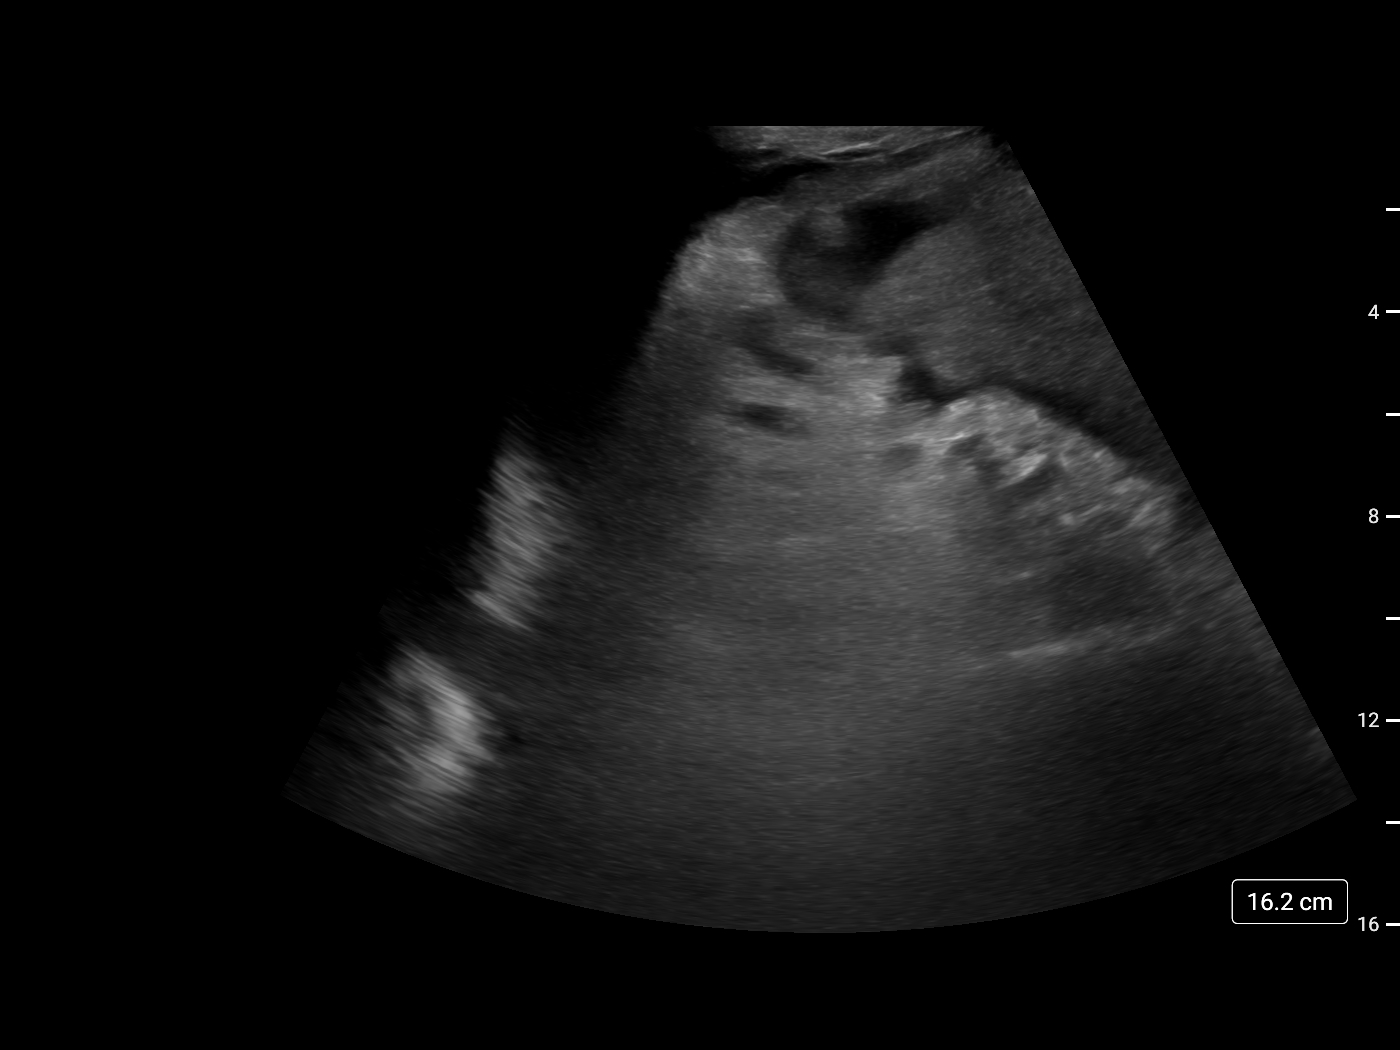

[2 of 2 positions shown; findings below may reference images not displayed]

Initial ultrasound scanning demonstrates a large amount of ascites
within the right lower abdominal quadrant. The right lower abdomen
was prepped and draped in the usual sterile fashion. 1% lidocaine
with epinephrine was used for local anesthesia. Under direct
ultrasound guidance, a 19 gauge, 7-cm, Yueh catheter was introduced.
An ultrasound image was saved for documentation purposed. The
paracentesis was performed. The catheter was removed and a dressing
was applied. The patient tolerated the procedure well without
immediate post procedural complication.
FINDINGS: A total of approximately 2.9 liters of clear yellow fluid was
removed. Samples were sent to the laboratory as requested by the
clinical team.
IMPRESSION: Successful ultrasound-guided paracentesis yielding 2.9 liters of
peritoneal fluid.

Read by

Davis Xavier Babilonia

## 2020-04-24 ENCOUNTER — Other Ambulatory Visit: Payer: Self-pay | Admitting: Interventional Radiology

## 2020-04-24 DIAGNOSIS — C50919 Malignant neoplasm of unspecified site of unspecified female breast: Secondary | ICD-10-CM

## 2020-05-22 DIAGNOSIS — K7469 Other cirrhosis of liver: Secondary | ICD-10-CM | POA: Diagnosis not present

## 2020-05-22 DIAGNOSIS — R188 Other ascites: Secondary | ICD-10-CM | POA: Diagnosis not present

## 2020-05-22 DIAGNOSIS — C220 Liver cell carcinoma: Secondary | ICD-10-CM | POA: Diagnosis not present

## 2020-05-23 DIAGNOSIS — N760 Acute vaginitis: Secondary | ICD-10-CM | POA: Diagnosis not present

## 2020-05-27 DIAGNOSIS — K7469 Other cirrhosis of liver: Secondary | ICD-10-CM | POA: Diagnosis not present

## 2020-05-27 DIAGNOSIS — C220 Liver cell carcinoma: Secondary | ICD-10-CM | POA: Diagnosis not present

## 2020-06-18 ENCOUNTER — Ambulatory Visit (HOSPITAL_COMMUNITY)
Admission: RE | Admit: 2020-06-18 | Discharge: 2020-06-18 | Disposition: A | Discharge status: Home / Self Care | Payer: BC Managed Care – PPO | Source: Ambulatory Visit | Attending: Interventional Radiology | Admitting: Interventional Radiology

## 2020-06-18 ENCOUNTER — Other Ambulatory Visit: Payer: Self-pay

## 2020-06-18 DIAGNOSIS — C787 Secondary malignant neoplasm of liver and intrahepatic bile duct: Secondary | ICD-10-CM | POA: Insufficient documentation

## 2020-06-18 DIAGNOSIS — C50919 Malignant neoplasm of unspecified site of unspecified female breast: Secondary | ICD-10-CM

## 2020-06-18 DIAGNOSIS — K7460 Unspecified cirrhosis of liver: Secondary | ICD-10-CM | POA: Diagnosis not present

## 2020-06-18 DIAGNOSIS — R188 Other ascites: Secondary | ICD-10-CM | POA: Diagnosis not present

## 2020-06-18 DIAGNOSIS — K7689 Other specified diseases of liver: Secondary | ICD-10-CM | POA: Diagnosis not present

## 2020-06-18 MED ORDER — GADOBUTROL 1 MMOL/ML IV SOLN
4.5000 mL | Freq: Once | INTRAVENOUS | Status: AC | PRN
  Administered 2020-06-18: 4.5 mL via INTRAVENOUS

## 2020-06-25 NOTE — Progress Notes (Signed)
[place]   [phone] Fax:([phone]) [phone]   Clinic Follow up Note   Patient Care Team: Seward Carol, MD as PCP - General (Internal Medicine) Arna Snipe, RN (Inactive) as Oncology Nurse Navigator Irene Shipper, MD as Consulting Physician (Gastroenterology) Truitt Merle, MD as Consulting Physician (Hematology) Alla Feeling, NP as Nurse Practitioner (Nurse Practitioner) Stark Klein, MD as Consulting Physician (General Surgery) Aletta Edouard, MD as Consulting Physician (Interventional Radiology) Roosevelt Locks, [place] as Nurse Practitioner (Nurse Practitioner) Mauro Kaufmann, RN as Oncology Nurse Navigator Rockwell Germany, RN as Oncology Nurse Navigator Eppie Gibson, MD as Attending Physician (Radiation Oncology)  Date of Service:  07/02/2020  CHIEF COMPLAINT: F/u of right breast cancer and liver cancer   SUMMARY OF ONCOLOGIC HISTORY: Oncology History Overview Note  Cancer Staging Hepatocellular carcinoma [facility]) Staging form: Liver, AJCC 8th Edition - Clinical stage from 08/02/2019: Stage II (cT2, cN0, cM0) - Signed by Truitt Merle, MD on 08/02/2019  Malignant neoplasm of central portion of right breast in female, estrogen receptor positive ([place]) Staging form: Breast, AJCC 8th Edition - Clinical stage from 08/29/2019: Stage IB (cT2, cN0, cM0, G2, ER+, PR+, HER2-) - Signed by Truitt Merle, MD on 09/05/2019    Hepatocellular carcinoma ([place])  07/04/2019 Imaging   CT AP IMPRESSION: 1. Findings of cirrhosis and hepatomegaly.  Patent portal vein. 2. Numerous varicosities as described above. 3. Moderate to large amount of abdominopelvic ascites 4. Contrast filled appendix which appears to be a mildly dilated with apparent wall thickening which could be due to surrounding ascites.   07/09/2019 Imaging   ABD US IMPRESSION: 1. Three small hypoechoic lesions in the RIGHT hepatic lobe measuring less than 1 cm but not evident on comparison contrast enhanced CT from  07/04/2019. Recommend MRI of the abdomen without and with contrast to evaluate these hepatic lesions in cirrhotic patient. 2. Increased liver echogenicity and lobular contour consistent cirrhosis. 3. Moderate mild to moderate volume of ascites. 4. Mild gallbladder wall thickening likely related to ascites. 5. No cholelithiasis or cholecystitis evident   07/26/2019 Imaging   MR ABD IMPRESSION: 1. In the lateral segment left hepatic lobe there are two LI-RADS category LR-5 lesions representing hepatocellular carcinoma. 2. There is also a LI-RADS category LR-3 lesion peripherally in the right hepatic lobe, intermediate probability for hepatocellular carcinoma. 3. Hepatic cirrhosis and diffuse wall thickening of the gallbladder. Widespread steatosis in the liver. 4. 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 5.  Aortic Atherosclerosis (ICD10-I70.0). 6. Widespread ascites and mesenteric edema.   07/26/2019 Tumor Marker   AFP 5.5 (baseline)   08/02/2019 Initial Diagnosis   Hepatocellular carcinoma ([place])   08/02/2019 Cancer Staging   Staging form: Liver, AJCC 8th Edition - Clinical stage from 08/02/2019: Stage II (cT2, cN0, cM0) - Signed by Truitt Merle, MD on 08/02/2019   08/07/2019 Imaging   CT Chest 08/07/19 IMPRESSION: 1. Tiny bilateral pulmonary nodules measuring up to 6 mm. These are too small to definitively characterize. Given the history of HCC, close follow-up recommended to ensure stability. 2.  Emphysema. (ICD10-J43.9)   08/09/2019 Procedure   Upper Endoscopy by Dr Henrene Pastor 08/09/19  IMPRESSION 1. Small esophageal varices 2. Mild diffuse portal hypertensive gastropathy 3. Otherwise normal EGD.   02/04/2020 Imaging   MRI abdomen  IMPRESSION: Stable exam. The small segment II lesions identified previously as LI-RADS category 5 are stable in size.   Previously characterized tiny LI-RADS 3 lesion in the inferior right liver is stable.  No new suspicious focal  hepatic lesion on a background liver morphology consistent with cirrhosis.   Interval decrease in ascites with decrease and diffuse gallbladder wall thickening.     06/18/2020 Imaging   MRI Abdomen  IMPRESSION: 1. Stable 1.1 x 0.9 cm rounded lesion in the medial left lobe of the liver, hepatic segment II, demonstrating arterial phase hyperenhancement without washout or capsule. This has previously been described as a LI-RADS category 5 lesion based on prior observations, if categorized by current imaging features this is consistent with LI-RADS category 4. 2. Stable 1.5 x 1.2 cm lesion lateral and inferior to this lesion in hepatic segment II with very subtle evidence of washout but without capsular enhancement. LI-RADS category 5. 3. Stable 5 mm focus of early arterial phase hyperenhancement of the inferior right lobe of the liver, hepatic segment VI, without evidence of washout or capsule. This remains consistent with LI-RADS category 3. 4. No new suspicious lesions or contrast enhancement. 5. Cirrhotic morphology of the liver. 6. Trace perihepatic ascites.     Malignant neoplasm of central portion of right breast in female, estrogen receptor positive ([place])  08/29/2019 Cancer Staging   Staging form: Breast, AJCC 8th Edition - Clinical stage from 08/29/2019: Stage IB (cT2, cN0, cM0, G2, ER+, PR+, HER2-) - Signed by Truitt Merle, MD on 09/05/2019   08/29/2019 Mammogram   Diagnostic mammogram 08/29/19 IMPRESSION: Suspicious mass in the 12 o'clock region of the right breast 3 cm from the nipple measuring 3.2 x 1.6 x 2.6 cm   08/29/2019 Initial Biopsy   Diagnosis 08/29/19 Breast, right, needle core biopsy, 12 o'clock - INVASIVE DUCTAL CARCINOMA, SEE COMMENT.   08/29/2019 Receptors her2   Results: IMMUNOHISTOCHEMICAL AND MORPHOMETRIC ANALYSIS PERFORMED MANUALLY The tumor cells are NEGATIVE for Her2 (1+). Estrogen Receptor: 100%, POSITIVE, STRONG STAINING INTENSITY Progesterone  Receptor: 90%, POSITIVE, STRONG STAINING INTENSITY Proliferation Marker Ki67: 15%   09/05/2019 Initial Diagnosis   Malignant neoplasm of central portion of right breast in female, estrogen receptor positive ([place])   09/13/2019 Breast MRI   MRI breast 09/13/19 IMPRESSION: 1. Irregular spiculated enhancing mass in the right breast representing the patient's known malignancy. No abnormal right axillary lymph nodes. 2. Prominent left internal mammary lymph node is indeterminate. No abnormal appearing right internal mammary lymph nodes. 3. No evidence of malignancy in the left breast.   10/16/2019 Cancer Staging   Staging form: Breast, AJCC 8th Edition - Pathologic stage from 10/16/2019: Stage IA (pT2, pN0, cM0, G2, ER+, PR+, HER2-, Oncotype DX score: 21) - Signed by Gardenia Phlegm, NP on 11/07/2019   10/16/2019 Oncotype testing   Oncotype 10/16/19 Recurrence score 21 with 7% benefit of Tamoxifen or AI. There is less than 1 % benefit of chemo.    10/16/2019 Surgery   RIGHT BREAST LUMPECTOMY WITH SENTINEL LYMPH NODE BIOPSY by Dr Barry Dienes 10/16/19    10/16/2019 Pathology Results   FINAL MICROSCOPIC DIAGNOSIS: 10/16/19  A. BREAST, RIGHT, LUMPECTOMY:  - Invasive ductal carcinoma, 2.3 cm, Nottingham grade 2 of 3.  - Margins of resection are not involved (Closest margin: 1 mm,  anterior).  - See oncology table.   B. SENTINEL LYMPH NODE, RIGHT AXILLARY #1, BIOPSY:  - One lymph node, negative for carcinoma (0/1).   C. SENTINEL LYMPH NODE, RIGHT AXILLARY #2, BIOPSY:  - One lymph node, negative for carcinoma (0/1).    09/2019 -  Anti-estrogen oral therapy   Anastrozole '1mg'$  daily started in Nov 2020. Due to worsened knee pain/arthritis, she  was switched to Tamoxifen in 03/2020.    12/03/2019 - 12/31/2019 Radiation Therapy   Adjuvant RT with Dr Isidore Moos 12/03/19-12/31/19   04/01/2020 Survivorship   SCP delivered by virtual visit per Cira Rue, NP       CURRENT THERAPY:  Anastrozole  '1mg'$  daily started in Nov 2020. Due to worsened knee pain/arthritis, she was switched to Tamoxifen in 03/2020.  Zometa q22month starting in 06/2020.   INTERVAL HISTORY:  Theresa Rocha here for a follow up. She was last seen by me 6 months ago and virtual visit with NP Lacie 3 months ago. She presents to the clinic alone. She notes she saw Dr YKathlene Cotelast week. Her MRI was stable and she will continue to f/u with him. I reviewed her medication list with her. She is no longer on diuretics. She notes she had more bleeding from IV 2 weeks ago with her MRI.     REVIEW OF SYSTEMS:   Constitutional: Denies fevers, chills or abnormal weight loss Eyes: Denies blurriness of vision Ears, nose, mouth, throat, and face: Denies mucositis or sore throat Respiratory: Denies cough, dyspnea or wheezes Cardiovascular: Denies palpitation, chest discomfort or lower extremity swelling Gastrointestinal:  Denies nausea, heartburn or change in bowel habits Skin: Denies abnormal skin rashes Lymphatics: Denies new lymphadenopathy or easy bruising Neurological:Denies numbness, tingling or new weaknesses Behavioral/Psych: Mood is stable, no new changes  All other systems were reviewed with the patient and are negative.  MEDICAL HISTORY:  Past Medical History:  Diagnosis Date   [place]ast cancer [place] 08/30/2019   Cancer ([facility]    liver- just diagnosed, not started treatment yet   Cervicalgia    COPD (chronic obstructive pulmonary disease) (HCC)    GERD (gastroesophageal reflux disease)    Headache    Hepatic cirrhosis (HCC)    Hepatitis C    resolved 2018   Hepatocellular carcinoma (HCC)    IBS (irritable bowel syndrome)    Lactose intolerance    Portal hypertension ([facility]    Raynaud's syndrome    Thrombocytopenia ([facility]     SURGICAL HISTORY: Past Surgical History:  Procedure Laterality Date   BREAST LUMPECTOMY WITH AXILLARY LYMPH NODE BIOPSY Right 10/16/2019   Procedure: RIGHT BREAST  LUMPECTOMY WITH SENTINEL LYMPH NODE BIOPSY;  Surgeon: BStark Klein MD;  [facility]  Service: General;  Laterality: Right;   COLONOSCOPY     HAMMER TOE SURGERY Left 2012   pins placed in great toe, 2nd,3rd, 4th toes, all pins removed except great toe   IR PARACENTESIS  11/13/2019   IR RADIOLOGIST EVAL & MGMT  08/16/2019   IR RADIOLOGIST EVAL & MGMT  02/06/2020   IR RADIOLOGIST EVAL & MGMT  06/26/2020   PELVIC LAPAROSCOPY Bilateral 1992   UPPER GASTROINTESTINAL ENDOSCOPY  2002   had esophageal dilitation    I have reviewed the social history and family history with the patient and they are unchanged from previous note.  ALLERGIES:  is allergic to hydromorphone hcl and aspirin.  MEDICATIONS:  Current Outpatient Medications  Medication Sig Dispense Refill   baclofen (LIORESAL) 10 MG tablet Take 10 mg by mouth 2 (two) times daily as needed.     dicyclomine (BENTYL) 20 MG tablet Take one tablet every 4-6 hours as needed for abdominal cramping 90 tablet 3   KLOR-CON M20 20 MEQ tablet TAKE 1 TABLET BY MOUTH EVERY DAY 30 tablet 3   loperamide (IMODIUM) 2 MG capsule Take 2 mg by mouth daily as needed  for diarrhea or loose stools.      ondansetron (ZOFRAN) 8 MG tablet Take 1 tablet (8 mg total) by mouth every 8 (eight) hours as needed for nausea or vomiting. (Patient not taking: Reported on 01/03/2020) 20 tablet 0   pantoprazole (PROTONIX) 40 MG tablet Take 1 tablet (40 mg total) by mouth daily. 30 tablet 6   promethazine (PHENERGAN) 12.5 MG tablet Take 1 tablet (12.5 mg total) by mouth every 6 (six) hours as needed for nausea or vomiting. 20 tablet 0   tamoxifen (NOLVADEX) 20 MG tablet Take 1 tablet (20 mg total) by mouth daily. 90 tablet 1   No current facility-administered medications for this visit.    PHYSICAL EXAMINATION: ECOG PERFORMANCE STATUS: 1 - Symptomatic but completely ambulatory  Vitals:   07/02/20 0907  BP: 126/82  Pulse: 78  Resp: 18  Temp: 98.6 F (37  C)  SpO2: 96%   Filed Weights   07/02/20 0907  Weight: 103 lb 1.6 oz (46.8 kg)    GENERAL:alert, no distress and comfortable SKIN: skin color, texture, turgor are normal, no rashes or significant lesions EYES: normal, Conjunctiva are pink and non-injected, sclera clear  NECK: supple, thyroid normal size, non-tender, without nodularity LYMPH:  no palpable lymphadenopathy in the cervical, axillary  LUNGS: clear to auscultation and percussion with normal breathing effort HEART: regular rate & rhythm and no murmurs and no lower extremity edema ABDOMEN:abdomen soft, non-tender and normal bowel sounds Musculoskeletal:no cyanosis of digits and no clubbing  NEURO: alert & oriented x 3 with fluent speech, no focal motor/sensory deficits BREAST: S/p right lumpectomy: Surgical incision healed well. No palpable mass, nodules or adenopathy bilaterally. Breast exam benign.   LABORATORY DATA:  I have reviewed the data as listed CBC Latest Ref Rng & Units 07/02/2020 01/03/2020 11/09/2019  WBC 4.0 - 10.5 K/uL 4.1 5.3 5.4  Hemoglobin 12.0 - 15.0 g/dL 10.9(L) 13.1 11.9(L)  Hematocrit 36 - 46 % 34.4(L) 39.7 36.0  Platelets 150 - 400 K/uL 54(L) 64(L) 85(L)     CMP Latest Ref Rng & Units 07/02/2020 03/28/2020 02/20/2020  Glucose 70 - 99 mg/dL 97 109(H) 152(H)  BUN 6 - 20 mg/dL '7 11 9  '$ Creatinine 0.44 - 1.00 mg/dL 0.72 0.72 0.59  Sodium 135 - 145 mmol/L 142 141 136  Potassium 3.5 - 5.1 mmol/L 4.0 3.9 3.7  Chloride 98 - 111 mmol/L 109 111 106  CO2 22 - 32 mmol/L '27 25 24  '$ Calcium 8.9 - 10.3 mg/dL 8.7(L) 8.6 8.3(L)  Total Protein 6.5 - 8.1 g/dL 7.1 - -  Total Bilirubin 0.3 - 1.2 mg/dL 0.6 - -  Alkaline Phos 38 - 126 U/L 113 - -  AST 15 - 41 U/L 28 - -  ALT 0 - 44 U/L 16 - -      RADIOGRAPHIC STUDIES: I have personally reviewed the radiological images as listed and agreed with the findings in the report. No results found.   ASSESSMENT & PLAN:  Theresa Rocha is a 54 y.o. female with    1.  Malignant neoplasm of central portion of right breast, Stage II, c(T2N0M0), ER/PR+, HER2-, Grade II, RS 21 -She was diagnosed in 07/2019. She underwent right lumpectomy with SLNB and adjuvant Radiation.  -Given low risk disease with Recurrence Score 21, adjuvant chemo was not recommended. -She completed adjuvant RT with Dr Isidore Moos 12/03/19-12/31/19 to reduce risk of local breast cancer.  -She started adjuvant anastrozole in November 2020, has been tolerating well. Due to  knee joint pain, her anastrozole was switched to Tamoxifen. She is tolerating with hot flashes well. Will continue to complete 10 years.  -She is clinically doing well. Lab reviewed, her CBC and CMP are within normal limits except Hg 10.9 ,plt 54K, Ca 8.7. Her physical exam unremarkable. There is no clinical concern for recurrence. -Continue Surveillance. Next Mammogram in 07/2020.  -Continue Tamoxifen -f/u in 6 months    2.Multifocal Hepatocellular carcinoma, cT2N0Mx, stage II -She initially presented with multifocal lesions of liver consistent with [place] with indeterminate right lobe lesions. Her AFP was in normal range.  -Given her history of liver disease and imaging findings, her diagnosis of HCC is quite certain.Her case was reviewed in GI tumor board and a biopsy was not felt to be needed to confirm the diagnosis.She has what appears to be early stage disease, no evidence of distant metastasis -She was seen by liver clinic Dawn Drazak to discuss liver transplant.She started workup and screening for transplant when she was diagnosed with right breast cancer. Unfortunately the breast cancer diagnosis and treatment will likely put her liver transplant on hold.  -She was seen by Dr. Barry Dienes and Dr. Kathlene Cote, unfortunately she was felt to be not a good candidate for surgical resection of liver targeted ablation at this point due to her liver function and location of tumors.  -We also previously discussed her case in GI tumor board, SBRT  radiation could be considered if the liver targeted radiation is not feasible. -I reviewed Her 06/18/20 abdominal MRI which shows stable liver lesions, some with typical appearance of [place]. She recently saw Dr Kathlene Cote who discussed doing a biopsy before recommending any treatment. She will continue to f/u with him for close observation. Next MRI in 6 months  -She will continue to f/u with Heide Spark every 6 months.    3. 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  -08/09/19 Upper endoscopy did show small Varices, no bleeding.  -Her Ascites have resolved. She is no longer on diuretics.  -f/u with Dr. Henrene Pastor   4. Hepatitis C -s/p harvoni and ribavirin per Dr. Bradd Burner in 2018  5. Alcohol and smoking Cessation  -She has h/o smoking 0.5 PPD x35 years. She drank 3-4 cans beer nightly x20 years. -Cessation of both was discussed and strongly encouraged. She has stopped drinking alcohol but smokes 5 cigarettes a day still. I encouraged her to continue reducing until she quits completely.  -CT chest from 08/08/19 shows tiny b/l pulmonary nodules, which are too small to characterize, will monitor.  6. Osteoporosis, Hypocalcemia  -Her DEXA from 09/21/19 which showed osteoporosis T score -3.1 at [facility]. Given the above, I switched her endocrine therapy from AI to tamoxifen. -I also discussed bisphosphonate with Zometa infusions to strengthen her bone and reduce risk of bone mets from breast cancer. I reviewed side effects with her. She note she has full dentures in place. She is agreeable to proceed. Plan to start in 2 weeks and continue every 6 months for 2 years.  -Her Ca is 8.7 today (07/02/20). I recommend she start Calcium supplement.   7. Low Weight  -I encouraged her to increase calorie, protein and carbohydrate intake wo help her gain weight before breast surgery.  -She notes she has been eating more red meat and  carbs and has been able to gain more weight, 103 pounds today (07/02/20)  8. Thrombocytopenia -Secondary to liver cirrhosis and  splenomegaly -I discussed the option of thrombopoietin agonist, such as Doptelet, before her breast surgery to reduce her risk of recurrence  -Platelets have been slowly trending down this year, at [place] today (07/02/20).   9. Anemia -She has had intermittent mild anemia lately.  -I discussed this could be bleeding from her Varices. She denies any obvious bleeding.  -Hg 10.9 today (07/02/20). I will obtain iron study for more work up -I recommend she start prenatal vitamin.  -Repeat lab in 3 months   PLAN:  -Continue Tamoxifen  -start Zometa in 2 weeks  -called lab to add on ferritin and iron study today  -Lab in 3 months for anemia f/u  -Lab, F/u and Zometa in 6 months    No problem-specific Assessment & Plan notes found for this encounter.   Orders Placed This Encounter  Procedures   Ferritin    Standing Status:   Future    Number of Occurrences:   1    Standing Expiration Date:   07/02/2021   Iron and TIBC    Standing Status:   Standing    Number of Occurrences:   10    Standing Expiration Date:   07/02/2021   All questions were answered. The patient knows to call the clinic with any problems, questions or concerns. No barriers to learning was detected. The total time spent in the appointment was 30 minutes.     Truitt Merle, MD 07/02/2020   I, Joslyn Devon, am acting as scribe for Truitt Merle, MD.   I have reviewed the above documentation for accuracy and completeness, and I agree with the above.

## 2020-06-26 ENCOUNTER — Ambulatory Visit
Admission: RE | Admit: 2020-06-26 | Discharge: 2020-06-26 | Disposition: A | Discharge status: Home / Self Care | Payer: BC Managed Care – PPO | Source: Ambulatory Visit | Attending: Interventional Radiology | Admitting: Interventional Radiology

## 2020-06-26 ENCOUNTER — Encounter: Payer: Self-pay | Admitting: *Deleted

## 2020-06-26 DIAGNOSIS — C50919 Malignant neoplasm of unspecified site of unspecified female breast: Secondary | ICD-10-CM

## 2020-06-26 DIAGNOSIS — C220 Liver cell carcinoma: Secondary | ICD-10-CM | POA: Diagnosis not present

## 2020-06-26 HISTORY — PX: IR RADIOLOGIST EVAL & MGMT: IMG5224

## 2020-06-26 NOTE — Progress Notes (Addendum)
Chief Complaint: Patient was seen in consultation today for follow-up of liver lesions.  History of Present Illness: Theresa Rocha is a 54 y.o. female being followed for possible ablation/local therapy of liver lesions suspicious for small hepatocellular carcinomas.  She has a history of chronic hepatitis C infection and cirrhosis with MRI of the abdomen on 07/26/2019 demonstrating 2 small left lobe lesions measuring approximately 1.1 cm and 1.5 cm in greatest diameter and demonstrating increased arterial phase enhancement and washout pattern consistent with small hepatocellular carcinomas (LI-RADS LR-5). Inferiorly in the right lobe there was a 0.7 cm area of arterial enhancement that was indeterminate (LR-3).  Neither of the 2 suspicious left lobe lesions could be localized by ultrasound in the liver.  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  She underwent lumpectomy and resection of 2 negative sentinel lymph nodes by Dr. Barry Dienes in November.  She just completed radiation therapy to the right breast in early February administered by Dr. Isidore Moos. She is on adjuvant anastrozole per [name].   She is feeling well and has less fatigue after treatment. She is now off diuretics with no significant recurrence of ascites. She does have some joint pain periodically from anastrozole, primarily in her knees. She denies any abdominal pain or other gastrointestinal symptoms.  Past Medical History:  Diagnosis Date  . Breast cancer ([place]) 08/30/2019  . Cancer [facility])    liver- just diagnosed, not started treatment yet  . Cervicalgia   . COPD (chronic obstructive pulmonary disease) ([place])   . GERD (gastroesophageal reflux disease)   . Headache   . Hepatic cirrhosis ([place])   . Hepatitis C    resolved 2018  . Hepatocellular carcinoma ([place])   .  IBS (irritable bowel syndrome)   . Lactose intolerance   . Portal hypertension ([place])   . Raynaud's syndrome   . Thrombocytopenia ([place])     Past Surgical History:  Procedure Laterality Date  . BREAST LUMPECTOMY WITH AXILLARY LYMPH NODE BIOPSY Right 10/16/2019   Procedure: RIGHT BREAST LUMPECTOMY WITH SENTINEL LYMPH NODE BIOPSY;  Surgeon: Stark Klein, MD;  Location: [place];  Service: General;  Laterality: Right;  . COLONOSCOPY    . HAMMER TOE SURGERY Left 2012   pins placed in great toe, 2nd,3rd, 4th toes, all pins removed except great toe  . IR PARACENTESIS  11/13/2019  . IR RADIOLOGIST EVAL & MGMT  08/16/2019  . IR RADIOLOGIST EVAL & MGMT  02/06/2020  . IR RADIOLOGIST EVAL & MGMT  06/26/2020  . PELVIC LAPAROSCOPY Bilateral 1992  . UPPER GASTROINTESTINAL ENDOSCOPY  2002   had esophageal dilitation    Allergies: Hydromorphone hcl and Aspirin  Medications: Prior to Admission medications   Medication Sig Start Date End Date Taking? Authorizing Provider  baclofen (LIORESAL) 10 MG tablet Take 10 mg by mouth 2 (two) times daily as needed. 10/31/19   [provider]  dicyclomine (BENTYL) 20 MG tablet Take one tablet every 4-6 hours as needed for abdominal cramping 11/21/19   Irene Shipper, MD  furosemide (LASIX) 40 MG tablet Take 1 tablet (40 mg total) by mouth daily. 11/09/19   Irene Shipper, MD  KLOR-CON M20 20 MEQ tablet TAKE 1 TABLET BY MOUTH EVERY DAY 03/10/20   Irene Shipper, MD  loperamide (IMODIUM) 2 MG capsule Take 2  mg by mouth daily as needed for diarrhea or loose stools.     [provider]  ondansetron (ZOFRAN) 8 MG tablet Take 1 tablet (8 mg total) by mouth every 8 (eight) hours as needed for nausea or vomiting. Patient not taking: Reported on 01/03/2020 08/02/19   Alla Feeling, NP  pantoprazole (PROTONIX) 40 MG tablet Take 1 tablet (40 mg total) by mouth daily. 09/26/19   Irene Shipper, MD  promethazine (PHENERGAN) 12.5 MG tablet Take 1 tablet (12.5 mg total)  by mouth every 6 (six) hours as needed for nausea or vomiting. 10/16/19   Stark Klein, MD  spironolactone (ALDACTONE) 100 MG tablet Take 1 tablet (100 mg total) by mouth daily. 11/09/19   Irene Shipper, MD  tamoxifen (NOLVADEX) 20 MG tablet Take 1 tablet (20 mg total) by mouth daily. 04/09/20   Alla Feeling, NP     Family History  Problem Relation Age of Onset  . Liver cancer Mother   . Lung cancer Mother   . Lung cancer Father   . Liver cancer Maternal Grandmother   . Colon cancer Neg Hx   . Esophageal cancer Neg Hx   . Stomach cancer Neg Hx   . Rectal cancer Neg Hx     Social History   Socioeconomic History  . Marital status: Married    Spouse name: Not on file  . Number of children: Not on file  . Years of education: Not on file  . Highest education level: Not on file  Occupational History  . Not on file  Tobacco Use  . Smoking status: Current Every Day Smoker    Packs/day: 0.50    Years: 30.00    Pack years: 15.00    Types: Cigarettes    Start date: 11/29/1978  . Smokeless tobacco: Never Used  Vaping Use  . Vaping Use: Never used  Substance and Sexual Activity  . Alcohol use: Not Currently    Alcohol/week: 6.0 standard drinks    Types: 6 Cans of beer per week    Comment: stopped 06/2019  . Drug use: No  . Sexual activity: Not Currently    Partners: Male  Other Topics Concern  . Not on file  Social History Narrative  . Not on file   Social Determinants of Health   Financial Resource Strain:   . Difficulty of Paying Living Expenses:   Food Insecurity:   . Worried About Charity fundraiser in the Last Year:   . Arboriculturist in the Last Year:   Transportation Needs:   . Film/video editor (Medical):   Marland Kitchen Lack of Transportation (Non-Medical):   Physical Activity:   . Days of Exercise per Week:   . Minutes of Exercise per Session:   Stress:   . Feeling of Stress :   Social Connections:   . Frequency of Communication with Friends and Family:   .  Frequency of Social Gatherings with Friends and Family:   . Attends Religious Services:   . Active Member of Clubs or Organizations:   . Attends Archivist Meetings:   Marland Kitchen Marital Status:     ECOG Status: 0 - Asymptomatic  Review of Systems: A 12 point ROS discussed and pertinent positives are indicated in the HPI above.  All other systems are negative.  Review of Systems  Constitutional: Negative.   Respiratory: Negative.   Cardiovascular: Negative.   Gastrointestinal: Negative.   Genitourinary: Negative.   Musculoskeletal:  Positive for arthralgias.  Neurological: Negative.     Vital Signs: LMP 04/20/2013   Physical Exam Constitutional:      General: She is not in acute distress.    Appearance: Normal appearance. She is not ill-appearing, toxic-appearing or diaphoretic.  Abdominal:     General: There is no distension.  Musculoskeletal:        General: No swelling.  Skin:    General: Skin is warm and dry.  Neurological:     General: No focal deficit present.     Mental Status: She is alert and oriented to person, place, and time.       Imaging: MR ABDOMEN WWO CONTRAST  Result Date: 06/19/2020 CLINICAL DATA:  Hepatitis C cirrhosis, LI-RADS category 5 lesions, surveillance follow-up, additional history of breast cancer status post lumpectomy EXAM: MRI ABDOMEN WITHOUT AND WITH CONTRAST TECHNIQUE: Multiplanar multisequence MR imaging of the abdomen was performed both before and after the administration of intravenous contrast. CONTRAST:  4.55m GADAVIST GADOBUTROL 1 MMOL/ML IV SOLN COMPARISON:  02/04/2020, 07/26/2019 FINDINGS: Lower chest: No acute findings. Hepatobiliary: Cirrhotic morphology of the liver. Redemonstrated rounded lesion in the medial left lobe of the liver, hepatic segment II, measuring 1.1 x 0.9 cm, unchanged in size, which again demonstrates brisk early arterial phase hyperenhancement, without evidence of washout or capsule (series 17, image 20).  There is a redemonstrated, more ill-defined lesion lateral and inferior to this lesion measuring 1.5 x 1.2 cm, unchanged, with very subtle evidence of washout but without capsular enhancement (series 17, image 28, series 21, image 30). Unchanged 5 mm focus of early arterial phase hyperenhancement of the inferior right lobe of the liver, hepatic segment VI, without evidence of washout or capsule (series 17, image 60). No new suspicious contrast enhancement identified. Pancreas: No mass, inflammatory changes, or other parenchymal abnormality identified. Spleen:  Within normal limits in size and appearance. Adrenals/Urinary Tract: No masses identified. No evidence of hydronephrosis. Stomach/Bowel: Visualized portions within the abdomen are unremarkable. Vascular/Lymphatic: No pathologically enlarged lymph nodes identified. No abdominal aortic aneurysm demonstrated. Other:  Trace perihepatic ascites. Musculoskeletal: No suspicious bone lesions identified. IMPRESSION: 1. Stable 1.1 x 0.9 cm rounded lesion in the medial left lobe of the liver, hepatic segment II, demonstrating arterial phase hyperenhancement without washout or capsule. This has previously been described as a LI-RADS category 5 lesion based on prior observations, if categorized by current imaging features this is consistent with LI-RADS category 4. 2. Stable 1.5 x 1.2 cm lesion lateral and inferior to this lesion in hepatic segment II with very subtle evidence of washout but without capsular enhancement. LI-RADS category 5. 3. Stable 5 mm focus of early arterial phase hyperenhancement of the inferior right lobe of the liver, hepatic segment VI, without evidence of washout or capsule. This remains consistent with LI-RADS category 3. 4. No new suspicious lesions or contrast enhancement. 5. Cirrhotic morphology of the liver. 6. Trace perihepatic ascites. Electronically Signed   By: AEddie CandleM.D.   On: 06/19/2020 09:28   IR Radiologist Eval &  Mgmt  Result Date: 06/26/2020 Please refer to notes tab for details about interventional procedure. (Op Note)   Labs:  CBC: Recent Labs    10/11/19 1101 10/16/19 0558 11/09/19 1107 01/03/20 0847  WBC 5.2 6.2 5.4 5.3  HGB 13.3 12.0 11.9* 13.1  HCT 40.2 36.8 36.0 39.7  PLT PLATELET CLUMPS NOTED ON SMEAR, COUNT APPEARS DECREASED PLATELET CLUMPS NOTED ON SMEAR, COUNT APPEARS DECREASED 85* 64*    COAGS:  Recent Labs    07/26/19 1505 10/11/19 1101 11/09/19 1107  INR 1.5* 1.4* 1.3*    BMP: Recent Labs    09/06/19 1506 09/21/19 1231 10/11/19 1101 10/11/19 1101 11/09/19 1107 11/19/19 1001 01/03/20 0847 01/24/20 1225 02/20/20 1151 03/28/20 1555  NA 138   < > 138   < > 139   < > 138 138 136 141  K 3.7   < > 4.0   < > 3.1*   < > 3.6 3.9 3.7 3.9  CL 105   < > 108   < > 105   < > 106 106 106 111  CO2 26   < > 24   < > 27   < > '26 26 24 25  '$ GLUCOSE 120*   < > 96   < > 84   < > 104* 96 152* 109*  BUN 10   < > 6   < > 8   < > '11 11 9 11  '$ CALCIUM 8.1*   < > 8.6*   < > 8.1*   < > 8.6* 9.0 8.3* 8.6  CREATININE 0.74   < > 0.76   < > 0.95   < > 0.75 0.63 0.59 0.72  GFRNONAA >60  --  >60  --  >60  --  >60  --   --   --   GFRAA >60  --  >60  --  >60  --  >60  --   --   --    < > = values in this interval not displayed.    LIVER FUNCTION TESTS: Recent Labs    09/06/19 1506 10/11/19 1101 11/09/19 1107 01/03/20 0847  BILITOT 1.3* 1.3* 1.0 1.3*  AST 40 37 31 32  ALT '23 24 18 20  '$ ALKPHOS 122 108 83 122  PROT 6.8 7.0 6.2* 7.0  ALBUMIN 2.9* 3.0* 2.7* 3.3*    TUMOR MARKERS: Recent Labs    07/26/19 1505  AFPTM 5.5    Assessment and Plan:  I met with Theresa Rocha and reviewed the recent MRI of the abdomen with her dated 06/18/2020.  This demonstrates stable appearance of the 1.1 cm and 1.5 cm hyperenhancing left lobe liver lesions (probable LR5) and unchanged 5 mm arterial focus of hyperenhancement in the inferior right lobe of the liver in segment VI (LR3).  No new liver lesions  are identified.  AFP level was 4.4 on 05/27/20 and 5.9 on 01/03/20.  It is reassuring that none of the 3 liver lesions including the 2 more suspicious lesions in the left lobe have not enlarged and appear stable and unchanged since the first MRI in August, 2020.  These lesions were not visible by ultrasound.  I recommended continued close surveillance of the liver by imaging and correlation with AFP levels.  I recommended another follow-up MRI in 6 months.   Electronically Signed: Azzie Roup 06/26/2020, 11:45 AM     I spent a total of 15 Minutes in face to face in clinical consultation, greater than 50% of which was counseling/coordinating care for follow up of liver lesions.

## 2020-07-02 ENCOUNTER — Encounter: Payer: Self-pay | Admitting: Hematology

## 2020-07-02 ENCOUNTER — Inpatient Hospital Stay: Payer: BC Managed Care – PPO

## 2020-07-02 ENCOUNTER — Other Ambulatory Visit: Payer: Self-pay

## 2020-07-02 ENCOUNTER — Inpatient Hospital Stay: Payer: BC Managed Care – PPO | Attending: Hematology | Admitting: Hematology

## 2020-07-02 VITALS — BP 126/82 | HR 78 | Temp 98.6°F | Resp 18 | Ht 63.0 in | Wt 103.1 lb

## 2020-07-02 DIAGNOSIS — Z170 Estrogen receptor positive status [ER+]: Secondary | ICD-10-CM | POA: Diagnosis not present

## 2020-07-02 DIAGNOSIS — D649 Anemia, unspecified: Secondary | ICD-10-CM

## 2020-07-02 DIAGNOSIS — Z79899 Other long term (current) drug therapy: Secondary | ICD-10-CM | POA: Insufficient documentation

## 2020-07-02 DIAGNOSIS — C220 Liver cell carcinoma: Secondary | ICD-10-CM

## 2020-07-02 DIAGNOSIS — B1920 Unspecified viral hepatitis C without hepatic coma: Secondary | ICD-10-CM | POA: Insufficient documentation

## 2020-07-02 DIAGNOSIS — F17210 Nicotine dependence, cigarettes, uncomplicated: Secondary | ICD-10-CM | POA: Insufficient documentation

## 2020-07-02 DIAGNOSIS — C50111 Malignant neoplasm of central portion of right female breast: Secondary | ICD-10-CM | POA: Insufficient documentation

## 2020-07-02 DIAGNOSIS — M818 Other osteoporosis without current pathological fracture: Secondary | ICD-10-CM | POA: Insufficient documentation

## 2020-07-02 LAB — CMP (CANCER CENTER ONLY)
ALT: 16 U/L (ref 0–44)
AST: 28 U/L (ref 15–41)
Albumin: 3.5 g/dL (ref 3.5–5.0)
Alkaline Phosphatase: 113 U/L (ref 38–126)
Anion gap: 6 (ref 5–15)
BUN: 7 mg/dL (ref 6–20)
CO2: 27 mmol/L (ref 22–32)
Calcium: 8.7 mg/dL — ABNORMAL LOW (ref 8.9–10.3)
Chloride: 109 mmol/L (ref 98–111)
Creatinine: 0.72 mg/dL (ref 0.44–1.00)
GFR, Est AFR Am: >60 mL/min (ref >60)
GFR, Estimated: >60 mL/min (ref >60)
Glucose, Bld: 97 mg/dL (ref 70–99)
Potassium: 4 mmol/L (ref 3.5–5.1)
Sodium: 142 mmol/L (ref 135–145)
Total Bilirubin: 0.6 mg/dL (ref 0.3–1.2)
Total Protein: 7.1 g/dL (ref 6.5–8.1)

## 2020-07-02 LAB — CBC WITH DIFFERENTIAL (CANCER CENTER ONLY)
Abs Immature Granulocytes: 0.01 10*3/uL (ref 0.00–0.07)
Basophils Absolute: 0 10*3/uL (ref 0.0–0.1)
Basophils Relative: 1 %
Eosinophils Absolute: 0.1 10*3/uL (ref 0.0–0.5)
Eosinophils Relative: 3 %
HCT: 34.4 % — ABNORMAL LOW (ref 36.0–46.0)
Hemoglobin: 10.9 g/dL — ABNORMAL LOW (ref 12.0–15.0)
Immature Granulocytes: 0 %
Lymphocytes Relative: 26 %
Lymphs Abs: 1.1 10*3/uL (ref 0.7–4.0)
MCH: 26 pg (ref 26.0–34.0)
MCHC: 31.7 g/dL (ref 30.0–36.0)
MCV: 81.9 fL (ref 80.0–100.0)
Monocytes Absolute: 0.5 10*3/uL (ref 0.1–1.0)
Monocytes Relative: 13 %
Neutro Abs: 2.3 10*3/uL (ref 1.7–7.7)
Neutrophils Relative %: 57 %
Platelet Count: 54 10*3/uL — ABNORMAL LOW (ref 150–400)
RBC: 4.2 MIL/uL (ref 3.87–5.11)
RDW: 19.8 % — ABNORMAL HIGH (ref 11.5–15.5)
WBC Count: 4.1 10*3/uL (ref 4.0–10.5)
nRBC: 0 % (ref 0.0–0.2)

## 2020-07-02 LAB — IRON AND TIBC
Iron: 29 ug/dL — ABNORMAL LOW (ref 41–142)
Saturation Ratios: 7 % — ABNORMAL LOW (ref 21–57)
TIBC: 404 ug/dL (ref 236–444)
UIBC: 375 ug/dL (ref 120–384)

## 2020-07-02 LAB — FERRITIN: Ferritin: 13 ng/mL (ref 11–307)

## 2020-07-03 ENCOUNTER — Telehealth: Payer: Self-pay | Admitting: Hematology

## 2020-07-03 LAB — AFP TUMOR MARKER: AFP, Serum, Tumor Marker: 4.2 ng/mL (ref 0.0–8.3)

## 2020-07-03 NOTE — Telephone Encounter (Signed)
Scheduled per 8/4 los. Pt is aware of appt times and dates.

## 2020-07-08 ENCOUNTER — Telehealth: Payer: Self-pay | Admitting: *Deleted

## 2020-07-08 NOTE — Telephone Encounter (Signed)
-----   Message from Truitt Merle, MD sent at 07/08/2020 10:30 AM EDT ----- Please let pt know her iron level is low, please start OTC iron pill once daily in addition to prenatal vitamin. Thanks   Truitt Merle

## 2020-07-08 NOTE — Telephone Encounter (Signed)
LM with note below 

## 2020-07-17 ENCOUNTER — Other Ambulatory Visit (INDEPENDENT_AMBULATORY_CARE_PROVIDER_SITE_OTHER): Payer: BC Managed Care – PPO

## 2020-07-17 ENCOUNTER — Inpatient Hospital Stay: Payer: BC Managed Care – PPO

## 2020-07-17 ENCOUNTER — Telehealth: Payer: Self-pay | Admitting: Internal Medicine

## 2020-07-17 ENCOUNTER — Other Ambulatory Visit: Payer: Self-pay

## 2020-07-17 VITALS — BP 149/82 | HR 80 | Temp 98.3°F | Resp 18

## 2020-07-17 DIAGNOSIS — K7460 Unspecified cirrhosis of liver: Secondary | ICD-10-CM | POA: Diagnosis not present

## 2020-07-17 DIAGNOSIS — C50111 Malignant neoplasm of central portion of right female breast: Secondary | ICD-10-CM

## 2020-07-17 DIAGNOSIS — Z170 Estrogen receptor positive status [ER+]: Secondary | ICD-10-CM | POA: Diagnosis not present

## 2020-07-17 DIAGNOSIS — Z79899 Other long term (current) drug therapy: Secondary | ICD-10-CM | POA: Diagnosis not present

## 2020-07-17 DIAGNOSIS — M818 Other osteoporosis without current pathological fracture: Secondary | ICD-10-CM | POA: Diagnosis not present

## 2020-07-17 DIAGNOSIS — C220 Liver cell carcinoma: Secondary | ICD-10-CM | POA: Diagnosis not present

## 2020-07-17 DIAGNOSIS — B1920 Unspecified viral hepatitis C without hepatic coma: Secondary | ICD-10-CM | POA: Diagnosis not present

## 2020-07-17 DIAGNOSIS — F17210 Nicotine dependence, cigarettes, uncomplicated: Secondary | ICD-10-CM | POA: Diagnosis not present

## 2020-07-17 LAB — BASIC METABOLIC PANEL
BUN: 8 mg/dL (ref 6–23)
CO2: 27 mEq/L (ref 19–32)
Calcium: 8.8 mg/dL (ref 8.4–10.5)
Chloride: 106 mEq/L (ref 96–112)
Creatinine, Ser: 0.75 mg/dL (ref 0.40–1.20)
GFR: 80.76 mL/min (ref >60.00)
Glucose, Bld: 96 mg/dL (ref 70–99)
Potassium: 3.9 mEq/L (ref 3.5–5.1)
Sodium: 140 mEq/L (ref 135–145)

## 2020-07-17 MED ORDER — ZOLEDRONIC ACID 4 MG/100ML IV SOLN
INTRAVENOUS | Status: AC
  Filled 2020-07-17: qty 100

## 2020-07-17 MED ORDER — SODIUM CHLORIDE 0.9 % IV SOLN
Freq: Once | INTRAVENOUS | Status: AC
  Filled 2020-07-17: qty 250

## 2020-07-17 MED ORDER — ZOLEDRONIC ACID 4 MG/100ML IV SOLN
4.0000 mg | Freq: Once | INTRAVENOUS | Status: AC
  Administered 2020-07-17: 4 mg via INTRAVENOUS

## 2020-07-17 NOTE — Telephone Encounter (Signed)
See below

## 2020-07-17 NOTE — Patient Instructions (Signed)
[facility] Discharge Instructions for Patients Receiving Chemotherapy  Today you received the following chemotherapy agents Zometa  To help prevent nausea and vomiting after your treatment, we encourage you to take your nausea medication.   If you develop nausea and vomiting that is not controlled by your nausea medication, call the clinic.   BELOW ARE SYMPTOMS THAT SHOULD BE REPORTED IMMEDIATELY:  *FEVER GREATER THAN 100.5 F  *CHILLS WITH OR WITHOUT FEVER  NAUSEA AND VOMITING THAT IS NOT CONTROLLED WITH YOUR NAUSEA MEDICATION  *UNUSUAL SHORTNESS OF BREATH  *UNUSUAL BRUISING OR BLEEDING  TENDERNESS IN MOUTH AND THROAT WITH OR WITHOUT PRESENCE OF ULCERS  *URINARY PROBLEMS  *BOWEL PROBLEMS  UNUSUAL RASH Items with * indicate a potential emergency and should be followed up as soon as possible.  Feel free to call the clinic should you have any questions or concerns. The clinic phone number is [phone].  Please show the [place] at check-in to the Emergency Department and triage nurse.  Zoledronic Acid injection (Hypercalcemia, Oncology) What is this medicine? ZOLEDRONIC ACID (ZOE le dron ik AS id) lowers the amount of calcium loss from bone. It is used to treat too much calcium in your blood from cancer. It is also used to prevent complications of cancer that has spread to the bone. This medicine may be used for other purposes; ask your health care provider or pharmacist if you have questions. COMMON BRAND NAME(S): Zometa What should I tell my health care provider before I take this medicine? They need to know if you have any of these conditions:  aspirin-sensitive asthma  cancer, especially if you are receiving medicines used to treat cancer  dental disease or wear dentures  infection  kidney disease  receiving corticosteroids like dexamethasone or prednisone  an unusual or allergic reaction to zoledronic acid, other medicines, foods,  dyes, or preservatives  pregnant or trying to get pregnant  breast-feeding How should I use this medicine? This medicine is for infusion into a vein. It is given by a health care professional in a hospital or clinic setting. Talk to your pediatrician regarding the use of this medicine in children. Special care may be needed. Overdosage: If you think you have taken too much of this medicine contact a poison control center or emergency room at once. NOTE: This medicine is only for you. Do not share this medicine with others. What if I miss a dose? It is important not to miss your dose. Call your doctor or health care professional if you are unable to keep an appointment. What may interact with this medicine?  certain antibiotics given by injection  NSAIDs, medicines for pain and inflammation, like ibuprofen or naproxen  some diuretics like bumetanide, furosemide  teriparatide  thalidomide This list may not describe all possible interactions. Give your health care provider a list of all the medicines, herbs, non-prescription drugs, or dietary supplements you use. Also tell them if you smoke, drink alcohol, or use illegal drugs. Some items may interact with your medicine. What should I watch for while using this medicine? Visit your doctor or health care professional for regular checkups. It may be some time before you see the benefit from this medicine. Do not stop taking your medicine unless your doctor tells you to. Your doctor may order blood tests or other tests to see how you are doing. Women should inform their doctor if they wish to become pregnant or think they might be pregnant. There is  a potential for serious side effects to an unborn child. Talk to your health care professional or pharmacist for more information. You should make sure that you get enough calcium and vitamin D while you are taking this medicine. Discuss the foods you eat and the vitamins you take with your health  care professional. Some people who take this medicine have severe bone, joint, and/or muscle pain. This medicine may also increase your risk for jaw problems or a broken thigh bone. Tell your doctor right away if you have severe pain in your jaw, bones, joints, or muscles. Tell your doctor if you have any pain that does not go away or that gets worse. Tell your dentist and dental surgeon that you are taking this medicine. You should not have major dental surgery while on this medicine. See your dentist to have a dental exam and fix any dental problems before starting this medicine. Take good care of your teeth while on this medicine. Make sure you see your dentist for regular follow-up appointments. What side effects may I notice from receiving this medicine? Side effects that you should report to your doctor or health care professional as soon as possible:  allergic reactions like skin rash, itching or hives, swelling of the face, lips, or tongue  anxiety, confusion, or depression  breathing problems  changes in vision  eye pain  feeling faint or lightheaded, falls  jaw pain, especially after dental work  mouth sores  muscle cramps, stiffness, or weakness  redness, blistering, peeling or loosening of the skin, including inside the mouth  trouble passing urine or change in the amount of urine Side effects that usually do not require medical attention (report to your doctor or health care professional if they continue or are bothersome):  bone, joint, or muscle pain  constipation  diarrhea  fever  hair loss  irritation at site where injected  loss of appetite  nausea, vomiting  stomach upset  trouble sleeping  trouble swallowing  weak or tired This list may not describe all possible side effects. Call your doctor for medical advice about side effects. You may report side effects to FDA at 1-800-FDA-1088. Where should I keep my medicine? This drug is given in a  hospital or clinic and will not be stored at home. NOTE: This sheet is a summary. It may not cover all possible information. If you have questions about this medicine, talk to your doctor, pharmacist, or health care provider.  2020 Elsevier/Gold Standard (2014-04-13 14:19:39)

## 2020-07-29 DIAGNOSIS — N760 Acute vaginitis: Secondary | ICD-10-CM | POA: Diagnosis not present

## 2020-07-29 DIAGNOSIS — R197 Diarrhea, unspecified: Secondary | ICD-10-CM | POA: Diagnosis not present

## 2020-07-29 DIAGNOSIS — N898 Other specified noninflammatory disorders of vagina: Secondary | ICD-10-CM | POA: Diagnosis not present

## 2020-10-01 ENCOUNTER — Other Ambulatory Visit: Payer: Self-pay | Admitting: Nurse Practitioner

## 2020-10-01 DIAGNOSIS — Z170 Estrogen receptor positive status [ER+]: Secondary | ICD-10-CM

## 2020-10-01 DIAGNOSIS — C50111 Malignant neoplasm of central portion of right female breast: Secondary | ICD-10-CM

## 2020-10-01 DIAGNOSIS — C220 Liver cell carcinoma: Secondary | ICD-10-CM

## 2020-10-02 ENCOUNTER — Ambulatory Visit
Admission: RE | Admit: 2020-10-02 | Discharge: 2020-10-02 | Disposition: A | Discharge status: Home / Self Care | Payer: BC Managed Care – PPO | Source: Ambulatory Visit | Attending: Nurse Practitioner | Admitting: Nurse Practitioner

## 2020-10-02 ENCOUNTER — Inpatient Hospital Stay: Payer: BC Managed Care – PPO | Attending: Hematology

## 2020-10-02 ENCOUNTER — Other Ambulatory Visit: Payer: Self-pay

## 2020-10-02 DIAGNOSIS — C220 Liver cell carcinoma: Secondary | ICD-10-CM | POA: Diagnosis not present

## 2020-10-02 DIAGNOSIS — D649 Anemia, unspecified: Secondary | ICD-10-CM

## 2020-10-02 DIAGNOSIS — C50111 Malignant neoplasm of central portion of right female breast: Secondary | ICD-10-CM | POA: Diagnosis not present

## 2020-10-02 DIAGNOSIS — Z170 Estrogen receptor positive status [ER+]: Secondary | ICD-10-CM | POA: Diagnosis not present

## 2020-10-02 DIAGNOSIS — R922 Inconclusive mammogram: Secondary | ICD-10-CM | POA: Diagnosis not present

## 2020-10-02 LAB — CMP (CANCER CENTER ONLY)
ALT: 28 U/L (ref 0–44)
AST: 60 U/L — ABNORMAL HIGH (ref 15–41)
Albumin: 3.8 g/dL (ref 3.5–5.0)
Alkaline Phosphatase: 89 U/L (ref 38–126)
Anion gap: 7 (ref 5–15)
BUN: 6 mg/dL (ref 6–20)
CO2: 28 mmol/L (ref 22–32)
Calcium: 8.5 mg/dL — ABNORMAL LOW (ref 8.9–10.3)
Chloride: 106 mmol/L (ref 98–111)
Creatinine: 0.77 mg/dL (ref 0.44–1.00)
GFR, Estimated: >60 mL/min (ref >60)
Glucose, Bld: 100 mg/dL — ABNORMAL HIGH (ref 70–99)
Potassium: 3.9 mmol/L (ref 3.5–5.1)
Sodium: 141 mmol/L (ref 135–145)
Total Bilirubin: 0.9 mg/dL (ref 0.3–1.2)
Total Protein: 7.8 g/dL (ref 6.5–8.1)

## 2020-10-02 LAB — CBC WITH DIFFERENTIAL (CANCER CENTER ONLY)
Abs Immature Granulocytes: 0.02 10*3/uL (ref 0.00–0.07)
Basophils Absolute: 0 10*3/uL (ref 0.0–0.1)
Basophils Relative: 1 %
Eosinophils Absolute: 0.1 10*3/uL (ref 0.0–0.5)
Eosinophils Relative: 2 %
HCT: 43.9 % (ref 36.0–46.0)
Hemoglobin: 14.5 g/dL (ref 12.0–15.0)
Immature Granulocytes: 0 %
Lymphocytes Relative: 24 %
Lymphs Abs: 1.3 10*3/uL (ref 0.7–4.0)
MCH: 28.8 pg (ref 26.0–34.0)
MCHC: 33 g/dL (ref 30.0–36.0)
MCV: 87.3 fL (ref 80.0–100.0)
Monocytes Absolute: 0.6 10*3/uL (ref 0.1–1.0)
Monocytes Relative: 12 %
Neutro Abs: 3.2 10*3/uL (ref 1.7–7.7)
Neutrophils Relative %: 61 %
Platelet Count: 78 10*3/uL — ABNORMAL LOW (ref 150–400)
RBC: 5.03 MIL/uL (ref 3.87–5.11)
RDW: 19.5 % — ABNORMAL HIGH (ref 11.5–15.5)
WBC Count: 5.2 10*3/uL (ref 4.0–10.5)
nRBC: 0 % (ref 0.0–0.2)

## 2020-10-02 LAB — IRON AND TIBC
Iron: 108 ug/dL (ref 41–142)
Saturation Ratios: 25 % (ref 21–57)
TIBC: 436 ug/dL (ref 236–444)
UIBC: 328 ug/dL (ref 120–384)

## 2020-10-03 LAB — AFP TUMOR MARKER: AFP, Serum, Tumor Marker: 3.6 ng/mL (ref 0.0–8.3)

## 2020-10-06 ENCOUNTER — Encounter: Payer: Self-pay | Admitting: Hematology

## 2020-10-11 ENCOUNTER — Other Ambulatory Visit: Payer: Self-pay | Admitting: Nurse Practitioner

## 2020-10-11 DIAGNOSIS — C50111 Malignant neoplasm of central portion of right female breast: Secondary | ICD-10-CM

## 2020-10-11 DIAGNOSIS — Z170 Estrogen receptor positive status [ER+]: Secondary | ICD-10-CM

## 2020-11-12 DIAGNOSIS — I8500 Esophageal varices without bleeding: Secondary | ICD-10-CM | POA: Diagnosis not present

## 2020-11-12 DIAGNOSIS — C220 Liver cell carcinoma: Secondary | ICD-10-CM | POA: Diagnosis not present

## 2020-11-12 DIAGNOSIS — K7469 Other cirrhosis of liver: Secondary | ICD-10-CM | POA: Diagnosis not present

## 2020-11-13 ENCOUNTER — Other Ambulatory Visit: Payer: Self-pay | Admitting: Nurse Practitioner

## 2020-11-13 DIAGNOSIS — C220 Liver cell carcinoma: Secondary | ICD-10-CM

## 2020-11-17 ENCOUNTER — Other Ambulatory Visit: Payer: Self-pay | Admitting: Interventional Radiology

## 2020-11-17 DIAGNOSIS — C220 Liver cell carcinoma: Secondary | ICD-10-CM

## 2020-11-17 DIAGNOSIS — C50919 Malignant neoplasm of unspecified site of unspecified female breast: Secondary | ICD-10-CM

## 2020-11-17 DIAGNOSIS — C787 Secondary malignant neoplasm of liver and intrahepatic bile duct: Secondary | ICD-10-CM

## 2020-11-23 ENCOUNTER — Other Ambulatory Visit: Payer: Self-pay | Admitting: Internal Medicine

## 2020-12-03 ENCOUNTER — Other Ambulatory Visit (HOSPITAL_COMMUNITY): Payer: Self-pay | Admitting: Nurse Practitioner

## 2020-12-03 ENCOUNTER — Other Ambulatory Visit: Payer: Self-pay | Admitting: *Deleted

## 2020-12-03 DIAGNOSIS — C220 Liver cell carcinoma: Secondary | ICD-10-CM

## 2020-12-06 ENCOUNTER — Other Ambulatory Visit: Payer: Self-pay | Admitting: Internal Medicine

## 2020-12-08 ENCOUNTER — Telehealth: Payer: Self-pay | Admitting: Internal Medicine

## 2020-12-08 MED ORDER — PANTOPRAZOLE SODIUM 40 MG PO TBEC: 40.0000 mg | DELAYED_RELEASE_TABLET | Freq: Every day | ORAL | 2 refills | Status: DC

## 2020-12-08 NOTE — Telephone Encounter (Signed)
Protonix refilled

## 2020-12-08 NOTE — Telephone Encounter (Signed)
Inbound call from patient requesting additional refills for Protonix medication be sent to pharmacy in chart please.

## 2020-12-16 ENCOUNTER — Other Ambulatory Visit: Payer: BC Managed Care – PPO

## 2020-12-16 DIAGNOSIS — C220 Liver cell carcinoma: Secondary | ICD-10-CM | POA: Diagnosis not present

## 2020-12-17 ENCOUNTER — Ambulatory Visit (HOSPITAL_COMMUNITY): Admission: RE | Admit: 2020-12-17 | Payer: BC Managed Care – PPO | Source: Ambulatory Visit

## 2020-12-17 ENCOUNTER — Encounter (HOSPITAL_COMMUNITY): Payer: Self-pay

## 2020-12-17 LAB — AFP TUMOR MARKER: AFP-Tumor Marker: 4.4 ng/mL

## 2020-12-18 ENCOUNTER — Telehealth: Payer: BC Managed Care – PPO

## 2020-12-30 ENCOUNTER — Ambulatory Visit (HOSPITAL_COMMUNITY)
Admission: RE | Admit: 2020-12-30 | Discharge: 2020-12-30 | Disposition: A | Discharge status: Home / Self Care | Payer: BC Managed Care – PPO | Source: Ambulatory Visit | Attending: Nurse Practitioner | Admitting: Nurse Practitioner

## 2020-12-30 ENCOUNTER — Other Ambulatory Visit: Payer: Self-pay

## 2020-12-30 DIAGNOSIS — C220 Liver cell carcinoma: Secondary | ICD-10-CM | POA: Diagnosis not present

## 2020-12-30 DIAGNOSIS — K7460 Unspecified cirrhosis of liver: Secondary | ICD-10-CM | POA: Diagnosis not present

## 2020-12-30 DIAGNOSIS — R188 Other ascites: Secondary | ICD-10-CM | POA: Diagnosis not present

## 2020-12-30 DIAGNOSIS — K7689 Other specified diseases of liver: Secondary | ICD-10-CM | POA: Diagnosis not present

## 2020-12-30 MED ORDER — GADOBUTROL 1 MMOL/ML IV SOLN
4.5000 mL | Freq: Once | INTRAVENOUS | Status: AC | PRN
  Administered 2020-12-30: 4.5 mL via INTRAVENOUS

## 2020-12-31 ENCOUNTER — Ambulatory Visit
Admission: RE | Admit: 2020-12-31 | Discharge: 2020-12-31 | Disposition: A | Discharge status: Home / Self Care | Payer: BC Managed Care – PPO | Source: Ambulatory Visit | Attending: Interventional Radiology | Admitting: Interventional Radiology

## 2020-12-31 ENCOUNTER — Encounter: Payer: Self-pay | Admitting: *Deleted

## 2020-12-31 DIAGNOSIS — C787 Secondary malignant neoplasm of liver and intrahepatic bile duct: Secondary | ICD-10-CM

## 2020-12-31 DIAGNOSIS — C50919 Malignant neoplasm of unspecified site of unspecified female breast: Secondary | ICD-10-CM

## 2020-12-31 DIAGNOSIS — K7689 Other specified diseases of liver: Secondary | ICD-10-CM | POA: Diagnosis not present

## 2020-12-31 HISTORY — PX: IR RADIOLOGIST EVAL & MGMT: IMG5224

## 2020-12-31 NOTE — Progress Notes (Signed)
Chief Complaint: Patient was consulted remotely today (TeleHealth) for follow up of liver lesions.  History of Present Illness: Theresa Rocha is a 55 y.o. female being followed for possible ablation/local therapy of liver lesions suspicious for small hepatocellular carcinomas. She has a history of chronic hepatitis C infection and cirrhosis with MRI of the abdomen on 8/27/2020demonstrating 2 small left lobe lesions measuring approximately 1.1 cm and 1.5 cm in greatest diameter and demonstrating increased arterial phase enhancement and washout pattern consistent with small hepatocellular carcinomas (LI-RADS LR-5). Inferiorly in the right lobe there was a 0.7 cm area of arterial enhancement that was indeterminate (LR-3).Neither of the 2 suspicious left lobe lesions could be localized by ultrasound in the liver.  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 She underwent lumpectomy and resection of 2 negativesentinel lymph nodes by Dr. Barry Dienes in November. She just completed radiation therapy to the right breast in early February administered by Dr. Isidore Moos. She is on adjuvant anastrozole per [name].  She has been asymptomatic.  Last AFP level was 4.4 on 12/16/2020.  AFP was 3.6 last November, 4.2 last August and 5.9 last February.   Past Medical History:  Diagnosis Date  . Breast cancer ([place]) 08/30/2019  . Cancer [facility])    liver- just diagnosed, not started treatment yet  . Cervicalgia   . COPD (chronic obstructive pulmonary disease) ([place])   . GERD (gastroesophageal reflux disease)   . Headache   . Hepatic cirrhosis ([place])   . Hepatitis C    resolved 2018  . Hepatocellular carcinoma ([place])   . IBS (irritable bowel syndrome)   . Lactose intolerance   . Personal history of radiation therapy 11/2019   Right breast  . Portal  hypertension ([place])   . Raynaud's syndrome   . Thrombocytopenia ([place])     Past Surgical History:  Procedure Laterality Date  . BREAST BIOPSY Right 09/2019  . BREAST LUMPECTOMY Right 09/2019  . BREAST LUMPECTOMY WITH AXILLARY LYMPH NODE BIOPSY Right 10/16/2019   Procedure: RIGHT BREAST LUMPECTOMY WITH SENTINEL LYMPH NODE BIOPSY;  Surgeon: Stark Klein, MD;  Location: [facility];  Service: General;  Laterality: Right;  . COLONOSCOPY    . HAMMER TOE SURGERY Left 2012   pins placed in great toe, 2nd,3rd, 4th toes, all pins removed except great toe  . IR PARACENTESIS  11/13/2019  . IR RADIOLOGIST EVAL & MGMT  08/16/2019  . IR RADIOLOGIST EVAL & MGMT  02/06/2020  . IR RADIOLOGIST EVAL & MGMT  06/26/2020  . PELVIC LAPAROSCOPY Bilateral 1992  . UPPER GASTROINTESTINAL ENDOSCOPY  2002   had esophageal dilitation    Allergies: Hydromorphone hcl and Aspirin  Medications: Prior to Admission medications   Medication Sig Start Date End Date Taking? Authorizing Provider  baclofen (LIORESAL) 10 MG tablet Take 10 mg by mouth 2 (two) times daily as needed. 10/31/19   [provider]  dicyclomine (BENTYL) 20 MG tablet Take one tablet every 4-6 hours as needed for abdominal cramping 11/21/19   Irene Shipper, MD  KLOR-CON M20 20 MEQ tablet TAKE 1 TABLET BY MOUTH EVERY DAY 03/10/20   Irene Shipper, MD  loperamide (IMODIUM) 2 MG capsule Take 2 mg by mouth daily as needed for diarrhea or loose stools.     [provider]  ondansetron [facility]) 8  MG tablet Take 1 tablet (8 mg total) by mouth every 8 (eight) hours as needed for nausea or vomiting. Patient not taking: Reported on 01/03/2020 08/02/19   Alla Feeling, NP  pantoprazole (PROTONIX) 40 MG tablet Take 1 tablet (40 mg total) by mouth daily. Office visit for further refills 12/08/20   Irene Shipper, MD  promethazine (PHENERGAN) 12.5 MG tablet Take 1 tablet (12.5 mg total) by mouth every 6 (six) hours as needed for nausea or vomiting. 10/16/19    Stark Klein, MD  tamoxifen (NOLVADEX) 20 MG tablet TAKE 1 TABLET BY MOUTH EVERY DAY 10/13/20   Truitt Merle, MD     Family History  Problem Relation Age of Onset  . Liver cancer Mother   . Lung cancer Mother   . Lung cancer Father   . Liver cancer Maternal Grandmother   . Colon cancer Neg Hx   . Esophageal cancer Neg Hx   . Stomach cancer Neg Hx   . Rectal cancer Neg Hx     Social History   Socioeconomic History  . Marital status: Married    Spouse name: Not on file  . Number of children: Not on file  . Years of education: Not on file  . Highest education level: Not on file  Occupational History  . Not on file  Tobacco Use  . Smoking status: Current Every Day Smoker    Packs/day: 0.50    Years: 30.00    Pack years: 15.00    Types: Cigarettes    Start date: 11/29/1978  . Smokeless tobacco: Never Used  Vaping Use  . Vaping Use: Never used  Substance and Sexual Activity  . Alcohol use: Not Currently    Alcohol/week: 6.0 standard drinks    Types: 6 Cans of beer per week    Comment: stopped 06/2019  . Drug use: No  . Sexual activity: Not Currently    Partners: Male  Other Topics Concern  . Not on file  Social History Narrative  . Not on file   Social Determinants of Health   Financial Resource Strain: Not on file  Food Insecurity: Not on file  Transportation Needs: Not on file  Physical Activity: Not on file  Stress: Not on file  Social Connections: Not on file    ECOG Status: 0 - Asymptomatic  Review of Systems  Constitutional: Negative.   Respiratory: Negative.   Cardiovascular: Negative.   Gastrointestinal: Negative.   Musculoskeletal: Negative.   Neurological: Negative.     Review of Systems: A 12 point ROS discussed and pertinent positives are indicated in the HPI above.  All other systems are negative.  Physical Exam No direct physical exam was performed (except for noted visual exam findings with Video Visits).   Vital Signs: LMP 04/20/2013    Imaging: No results found.  Labs:  CBC: Recent Labs    01/03/20 0847 07/02/20 0834 10/02/20 0954  WBC 5.3 4.1 5.2  HGB 13.1 10.9* 14.5  HCT 39.7 34.4* 43.9  PLT 64* 54* 78*    COAGS: No results for input(s): INR, APTT in the last 8760 hours.  BMP: Recent Labs    01/03/20 0847 01/24/20 1225 03/28/20 1555 07/02/20 0834 07/17/20 1053 10/02/20 0954  NA 138   < > 141 142 140 141  K 3.6   < > 3.9 4.0 3.9 3.9  CL 106   < > 111 109 106 106  CO2 26   < > 25 27 27  28  GLUCOSE 104*   < > 109* 97 96 100*  BUN 11   < > 11 7 8 6   CALCIUM 8.6*   < > 8.6 8.7* 8.8 8.5*  CREATININE 0.75   < > 0.72 0.72 0.75 0.77  GFRNONAA >60  --   --  >60  --  >60  GFRAA >60  --   --  >60  --   --    < > = values in this interval not displayed.    LIVER FUNCTION TESTS: Recent Labs    01/03/20 0847 07/02/20 0834 10/02/20 0954  BILITOT 1.3* 0.6 0.9  AST 32 28 60*  ALT 20 16 28   ALKPHOS 122 113 89  PROT 7.0 7.1 7.8  ALBUMIN 3.3* 3.5 3.8    TUMOR MARKERS: Recent Labs    12/16/20 1106  AFPTM 4.4    Assessment and Plan:  I spoke with Theresa Rocha over the phone.  We reviewed the follow-up MRI which was performed yesterday on 12/30/2020.  This demonstrates stable segment II lesion measuring 1.0 x 0.9 cm, stable segment II lesion measuring 1.3 x 1.2 cm and stable tiny focus of arterial hyperenhancement in the right lobe in segment VI measuring no more than 5 mm.  Lesions are characterized as LR 4 and LR 3.  Given stability of the small lesions, I recommended continued surveillance with periodic MRI.  These have been performed at roughly a 81-month interval since August 2020.  Theresa Rocha will also be following up with [name].   Electronically Signed: Azzie Roup 12/31/2020, 9:59 AM     I spent a total of 10 Minutes in remote  clinical consultation, greater than 50% of which was counseling/coordinating care for liver lesions.    Visit type: Audio only (telephone). Audio (no video) only due  to patient's lack of internet/smartphone capability.   Alternative for in-person consultation at [facility], [address] [facility], [place], [place]. This visit type was conducted due to national recommendations for restrictions regarding the COVID-19 Pandemic (e.g. social distancing).  This format is felt to be most appropriate for this patient at this time.  All issues noted in this document were discussed and addressed.

## 2020-12-31 NOTE — Progress Notes (Signed)
[place]   [phone] Fax:([phone]) [phone]   Clinic Follow up Note   Patient Care Team: Seward Carol, MD as PCP - General (Internal Medicine) Arna Snipe, RN (Inactive) as Oncology Nurse Navigator Irene Shipper, MD as Consulting Physician (Gastroenterology) Truitt Merle, MD as Consulting Physician (Hematology) Alla Feeling, NP as Nurse Practitioner (Nurse Practitioner) Stark Klein, MD as Consulting Physician (General Surgery) Aletta Edouard, MD as Consulting Physician (Interventional Radiology) Roosevelt Locks, [place] as Nurse Practitioner (Nurse Practitioner) Mauro Kaufmann, RN as Oncology Nurse Navigator Rockwell Germany, RN as Oncology Nurse Navigator Eppie Gibson, MD as Attending Physician (Radiation Oncology)  Date of Service:  01/02/2021  CHIEF COMPLAINT: F/u of right breast cancer and liver cancer  SUMMARY OF ONCOLOGIC HISTORY: Oncology History Overview Note  Cancer Staging Hepatocellular carcinoma [facility]) Staging form: Liver, AJCC 8th Edition - Clinical stage from 08/02/2019: Stage II (cT2, cN0, cM0) - Signed by Truitt Merle, MD on 08/02/2019  Malignant neoplasm of central portion of right breast in female, estrogen receptor positive ([place]) Staging form: Breast, AJCC 8th Edition - Clinical stage from 08/29/2019: Stage IB (cT2, cN0, cM0, G2, ER+, PR+, HER2-) - Signed by Truitt Merle, MD on 09/05/2019    Hepatocellular carcinoma ([place])  07/04/2019 Imaging   CT AP IMPRESSION: 1. Findings of cirrhosis and hepatomegaly.  Patent portal vein. 2. Numerous varicosities as described above. 3. Moderate to large amount of abdominopelvic ascites 4. Contrast filled appendix which appears to be a mildly dilated with apparent wall thickening which could be due to surrounding ascites.   07/09/2019 Imaging   ABD US IMPRESSION: 1. Three small hypoechoic lesions in the RIGHT hepatic lobe measuring less than 1 cm but not evident on comparison contrast enhanced CT from  07/04/2019. Recommend MRI of the abdomen without and with contrast to evaluate these hepatic lesions in cirrhotic patient. 2. Increased liver echogenicity and lobular contour consistent cirrhosis. 3. Moderate mild to moderate volume of ascites. 4. Mild gallbladder wall thickening likely related to ascites. 5. No cholelithiasis or cholecystitis evident   07/26/2019 Imaging   MR ABD IMPRESSION: 1. In the lateral segment left hepatic lobe there are two LI-RADS category LR-5 lesions representing hepatocellular carcinoma. 2. There is also a LI-RADS category LR-3 lesion peripherally in the right hepatic lobe, intermediate probability for hepatocellular carcinoma. 3. Hepatic cirrhosis and diffuse wall thickening of the gallbladder. Widespread steatosis in the liver. 4. 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 5.  Aortic Atherosclerosis (ICD10-I70.0). 6. Widespread ascites and mesenteric edema.   07/26/2019 Tumor Marker   AFP 5.5 (baseline)   08/02/2019 Initial Diagnosis   Hepatocellular carcinoma ([place])   08/02/2019 Cancer Staging   Staging form: Liver, AJCC 8th Edition - Clinical stage from 08/02/2019: Stage II (cT2, cN0, cM0) - Signed by Truitt Merle, MD on 08/02/2019   08/07/2019 Imaging   CT Chest 08/07/19 IMPRESSION: 1. Tiny bilateral pulmonary nodules measuring up to 6 mm. These are too small to definitively characterize. Given the history of HCC, close follow-up recommended to ensure stability. 2.  Emphysema. (ICD10-J43.9)   08/09/2019 Procedure   Upper Endoscopy by Dr Henrene Pastor 08/09/19  IMPRESSION 1. Small esophageal varices 2. Mild diffuse portal hypertensive gastropathy 3. Otherwise normal EGD.   02/04/2020 Imaging   MRI abdomen  IMPRESSION: Stable exam. The small segment II lesions identified previously as LI-RADS category 5 are stable in size.   Previously characterized tiny LI-RADS 3 lesion in the inferior right liver is stable.   No  new suspicious focal  hepatic lesion on a background liver morphology consistent with cirrhosis.   Interval decrease in ascites with decrease and diffuse gallbladder wall thickening.     06/18/2020 Imaging   MRI Abdomen  IMPRESSION: 1. Stable 1.1 x 0.9 cm rounded lesion in the medial left lobe of the liver, hepatic segment II, demonstrating arterial phase hyperenhancement without washout or capsule. This has previously been described as a LI-RADS category 5 lesion based on prior observations, if categorized by current imaging features this is consistent with LI-RADS category 4. 2. Stable 1.5 x 1.2 cm lesion lateral and inferior to this lesion in hepatic segment II with very subtle evidence of washout but without capsular enhancement. LI-RADS category 5. 3. Stable 5 mm focus of early arterial phase hyperenhancement of the inferior right lobe of the liver, hepatic segment VI, without evidence of washout or capsule. This remains consistent with LI-RADS category 3. 4. No new suspicious lesions or contrast enhancement. 5. Cirrhotic morphology of the liver. 6. Trace perihepatic ascites.     Malignant neoplasm of central portion of right breast in female, estrogen receptor positive ([place])  08/29/2019 Cancer Staging   Staging form: Breast, AJCC 8th Edition - Clinical stage from 08/29/2019: Stage IB (cT2, cN0, cM0, G2, ER+, PR+, HER2-) - Signed by Truitt Merle, MD on 09/05/2019   08/29/2019 Mammogram   Diagnostic mammogram 08/29/19 IMPRESSION: Suspicious mass in the 12 o'clock region of the right breast 3 cm from the nipple measuring 3.2 x 1.6 x 2.6 cm   08/29/2019 Initial Biopsy   Diagnosis 08/29/19 Breast, right, needle core biopsy, 12 o'clock - INVASIVE DUCTAL CARCINOMA, SEE COMMENT.   08/29/2019 Receptors her2   Results: IMMUNOHISTOCHEMICAL AND MORPHOMETRIC ANALYSIS PERFORMED MANUALLY The tumor cells are NEGATIVE for Her2 (1+). Estrogen Receptor: 100%, POSITIVE, STRONG STAINING INTENSITY Progesterone  Receptor: 90%, POSITIVE, STRONG STAINING INTENSITY Proliferation Marker Ki67: 15%   09/05/2019 Initial Diagnosis   Malignant neoplasm of central portion of right breast in female, estrogen receptor positive ([place])   09/13/2019 Breast MRI   MRI breast 09/13/19 IMPRESSION: 1. Irregular spiculated enhancing mass in the right breast representing the patient's known malignancy. No abnormal right axillary lymph nodes. 2. Prominent left internal mammary lymph node is indeterminate. No abnormal appearing right internal mammary lymph nodes. 3. No evidence of malignancy in the left breast.   10/16/2019 Cancer Staging   Staging form: Breast, AJCC 8th Edition - Pathologic stage from 10/16/2019: Stage IA (pT2, pN0, cM0, G2, ER+, PR+, HER2-, Oncotype DX score: 21) - Signed by Gardenia Phlegm, NP on 11/07/2019   10/16/2019 Oncotype testing   Oncotype 10/16/19 Recurrence score 21 with 7% benefit of Tamoxifen or AI. There is less than 1 % benefit of chemo.    10/16/2019 Surgery   RIGHT BREAST LUMPECTOMY WITH SENTINEL LYMPH NODE BIOPSY by Dr Barry Dienes 10/16/19    10/16/2019 Pathology Results   FINAL MICROSCOPIC DIAGNOSIS: 10/16/19  A. BREAST, RIGHT, LUMPECTOMY:  - Invasive ductal carcinoma, 2.3 cm, Nottingham grade 2 of 3.  - Margins of resection are not involved (Closest margin: 1 mm,  anterior).  - See oncology table.   B. SENTINEL LYMPH NODE, RIGHT AXILLARY #1, BIOPSY:  - One lymph node, negative for carcinoma (0/1).   C. SENTINEL LYMPH NODE, RIGHT AXILLARY #2, BIOPSY:  - One lymph node, negative for carcinoma (0/1).    09/2019 -  Anti-estrogen oral therapy   Anastrozole 64m daily started in Nov 2020. Due to worsened knee pain/arthritis, she was  switched to Tamoxifen in 03/2020.    12/03/2019 - 12/31/2019 Radiation Therapy   Adjuvant RT with Dr Isidore Moos 12/03/19-12/31/19   04/01/2020 Survivorship   SCP delivered by virtual visit per Cira Rue, NP       CURRENT THERAPY:  Anastrozole  $RemoveBefo'1mg'hGoIgpkmimu$  daily started in Nov 2020. Due to worsened knee pain/arthritis, she was switched to Tamoxifen in 03/2020.  Zometa q100months starting in 06/2020.   INTERVAL HISTORY:  Theresa Rocha is here for a follow up of breast and liver cancer. She was last seen by me 6 months ago. She presents to the clinic alone. She notes she plans to see Roosevelt Locks again in June. She notes Tamoxifen is better than anastrozole. She has less joint pain now. She notes after her first Zometa infusion she had flu like symptoms only on the day after. She agreed to continue Zometa. She notes diarrhea from IBS and Lactose intolerance.    REVIEW OF SYSTEMS:   Constitutional: Denies fevers, chills or abnormal weight loss Eyes: Denies blurriness of vision Ears, nose, mouth, throat, and face: Denies mucositis or sore throat Respiratory: Denies cough, dyspnea or wheezes Cardiovascular: Denies palpitation, chest discomfort or lower extremity swelling Gastrointestinal:  Denies nausea, heartburn or change in bowel habits Skin: Denies abnormal skin rashes Lymphatics: Denies new lymphadenopathy or easy bruising Neurological:Denies numbness, tingling or new weaknesses Behavioral/Psych: Mood is stable, no new changes  All other systems were reviewed with the patient and are negative.  MEDICAL HISTORY:  Past Medical History:  Diagnosis Date  . Breast cancer ([place]) 08/30/2019  . Cancer [facility])    liver- just diagnosed, not started treatment yet  . Cervicalgia   . COPD (chronic obstructive pulmonary disease) ([place])   . GERD (gastroesophageal reflux disease)   . Headache   . Hepatic cirrhosis ([place])   . Hepatitis C    resolved 2018  . Hepatocellular carcinoma ([place])   . IBS (irritable bowel syndrome)   . Lactose intolerance   . Personal history of radiation therapy 11/2019   Right breast  . Portal hypertension ([place])   . Raynaud's syndrome   . Thrombocytopenia ([place])     SURGICAL HISTORY: Past Surgical History:  Procedure  Laterality Date  . BREAST BIOPSY Right 09/2019  . BREAST LUMPECTOMY Right 09/2019  . BREAST LUMPECTOMY WITH AXILLARY LYMPH NODE BIOPSY Right 10/16/2019   Procedure: RIGHT BREAST LUMPECTOMY WITH SENTINEL LYMPH NODE BIOPSY;  Surgeon: Stark Klein, MD;  Location: [place];  Service: General;  Laterality: Right;  . COLONOSCOPY    . HAMMER TOE SURGERY Left 2012   pins placed in great toe, 2nd,3rd, 4th toes, all pins removed except great toe  . IR PARACENTESIS  11/13/2019  . IR RADIOLOGIST EVAL & MGMT  08/16/2019  . IR RADIOLOGIST EVAL & MGMT  02/06/2020  . IR RADIOLOGIST EVAL & MGMT  06/26/2020  . IR RADIOLOGIST EVAL & MGMT  12/31/2020  . PELVIC LAPAROSCOPY Bilateral 1992  . UPPER GASTROINTESTINAL ENDOSCOPY  2002   had esophageal dilitation    I have reviewed the social history and family history with the patient and they are unchanged from previous note.  ALLERGIES:  is allergic to hydromorphone hcl and aspirin.  MEDICATIONS:  Current Outpatient Medications  Medication Sig Dispense Refill  . baclofen (LIORESAL) 10 MG tablet Take 10 mg by mouth 2 (two) times daily as needed.    . dicyclomine (BENTYL) 20 MG tablet Take one tablet every 4-6 hours as needed for abdominal cramping 90  tablet 3  . KLOR-CON M20 20 MEQ tablet TAKE 1 TABLET BY MOUTH EVERY DAY 30 tablet 3  . loperamide (IMODIUM) 2 MG capsule Take 2 mg by mouth daily as needed for diarrhea or loose stools.     . ondansetron (ZOFRAN) 8 MG tablet Take 1 tablet (8 mg total) by mouth every 8 (eight) hours as needed for nausea or vomiting. (Patient not taking: Reported on 01/03/2020) 20 tablet 0  . pantoprazole (PROTONIX) 40 MG tablet Take 1 tablet (40 mg total) by mouth daily. Office visit for further refills 30 tablet 2  . promethazine (PHENERGAN) 12.5 MG tablet Take 1 tablet (12.5 mg total) by mouth every 6 (six) hours as needed for nausea or vomiting. 20 tablet 0  . tamoxifen (NOLVADEX) 20 MG tablet TAKE 1 TABLET BY MOUTH EVERY DAY 90 tablet 1    No current facility-administered medications for this visit.    PHYSICAL EXAMINATION: ECOG PERFORMANCE STATUS: 1 - Symptomatic but completely ambulatory  Vitals:   01/02/21 0942  BP: 117/77  Pulse: 83  Resp: 16  Temp: 98.6 F (37 C)  SpO2: 100%   Filed Weights   01/02/21 0942  Weight: 111 lb 12.8 oz (50.7 kg)    GENERAL:alert, no distress and comfortable SKIN: skin color, texture, turgor are normal, no rashes or significant lesions EYES: normal, Conjunctiva are pink and non-injected, sclera clear  NECK: supple, thyroid normal size, non-tender, without nodularity LYMPH:  no palpable lymphadenopathy in the cervical, axillary  LUNGS: clear to auscultation and percussion with normal breathing effort HEART: regular rate & rhythm and no murmurs and no lower extremity edema ABDOMEN:abdomen soft, non-tender and normal bowel sounds Musculoskeletal:no cyanosis of digits and no clubbing  NEURO: alert & oriented x 3 with fluent speech, no focal motor/sensory deficits BREAST: S/p right lumpectomy: Surgical incision healed well with minimal scar tissue. No palpable mass, nodules or adenopathy bilaterally. Breast exam benign.   LABORATORY DATA:  I have reviewed the data as listed CBC Latest Ref Rng & Units 01/02/2021 10/02/2020 07/02/2020  WBC 4.0 - 10.5 K/uL 4.8 5.2 4.1  Hemoglobin 12.0 - 15.0 g/dL 9.6(L) 14.5 10.9(L)  Hematocrit 36.0 - 46.0 % 30.4(L) 43.9 34.4(L)  Platelets 150 - 400 K/uL 77(L) 78(L) 54(L)     CMP Latest Ref Rng & Units 01/02/2021 10/02/2020 07/17/2020  Glucose 70 - 99 mg/dL 102(H) 100(H) 96  BUN 6 - 20 mg/dL 4(L) 6 8  Creatinine 0.44 - 1.00 mg/dL 0.72 0.77 0.75  Sodium 135 - 145 mmol/L 140 141 140  Potassium 3.5 - 5.1 mmol/L 3.7 3.9 3.9  Chloride 98 - 111 mmol/L 112(H) 106 106  CO2 22 - 32 mmol/L _0 Calcium 8.9 - 10.3 mg/dL 8.0(L) 8.5(L) 8.8  Total Protein 6.5 - 8.1 g/dL 6.7 7.8 -  Total Bilirubin 0.3 - 1.2 mg/dL 0.5 0.9 -  Alkaline Phos 38 - 126 U/L 77 89  -  AST 15 - 41 U/L 43(H) 60(H) -  ALT 0 - 44 U/L 23 28 -      RADIOGRAPHIC STUDIES: I have personally reviewed the radiological images as listed and agreed with the findings in the report. No results found.   ASSESSMENT & PLAN:  Theresa Rocha is a 55 y.o. female with   1. Malignant neoplasm of central portion of right breast, Stage II, c(T2N0M0), ER/PR+, HER2-, Grade II, RS 21 -She was diagnosed in 07/2019. She underwent right lumpectomy with SLNB and adjuvant Radiation.  -Given low  risk disease with Recurrence Score 21, adjuvant chemo was not recommended.  -Shecompletedadjuvant RT with Dr Squire1/4/21-2/1/21to reduce risk of local breast cancer.  -She started adjuvant anastrozole in November 2020, has been tolerating well. Due to knee joint pain, her anastrozole was switched to Tamoxifen. She is tolerating with hot flashes well. Will continue to complete 10 years.  -From a breast cancer standpoint, She is clinically doing well. Lab reviewed, her CBC and CMP are within normal limits except Hg 9.6, plt 77K. Her physical exam and her 09/2020 mammogram were unremarkable. There is no clinical concern for recurrence. -Continue Surveillance. Next Mammogram in 09/2020.  -Continue Tamoxifen -f/u in 6 months    2.Multifocal Hepatocellular carcinoma, cT2N0Mx, stage II -She initially presented with multifocal lesions of liver consistent with [place] with indeterminate right lobe lesions. Her AFPwas in normal range.  -Given her history of liver disease and imaging findings, her diagnosis of HCC is quite certain.Her case was reviewed in GI tumor board and a biopsy was not felt to be needed to confirm the diagnosis.She has what appears to be early stage disease, no evidence of distant metastasis -She was seen by liver clinic Dawn Drazak to discuss liver transplant.She started workup and screening for transplant when she was diagnosed with right breast cancer. Unfortunately the breast cancer  diagnosis and treatment will likely put her liver transplant on hold.  -She was seen by Dr. Barry Dienes and Dr. Kathlene Cote, unfortunately she was felt to be not a good candidate for surgical resection or liver targeted ablation at this point due to her liver function and location of tumors.  -We also previously discussed her case in GI tumor board, SBRT radiation could be considered if theliver targetedradiation is not feasible. She is currently on observation.  -Her MRI Liver from 12/30/20 showed stable liver lesions and no new suspicious lesions. I reviewed with patient today.  -She will continue to f/u with him for close observation. She will continue to f/u with Heide Spark every 6 months.    3. 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  -08/09/19 Upper endoscopy did show small Varices, no bleeding.  -Her Ascites have resolved. She is no longer on diuretics.  -Given recently worsened anemia, I recommend she f/u with Dr Henrene Pastor soon.   4. Hepatitis C -s/p harvoni and ribavirin per Dr. Bradd Burner in 2018  5. Alcohol and smoking Cessation  -She has h/o smoking 0.5 PPD x35 years. She drank 3-4 cans beer nightly x20 years. -Cessation of both was discussed and strongly encouraged. She has stopped drinking alcohol but smokes 5 cigarettes a day still. I encouraged her to continue reducing until she quits completely.  -CT chest from 08/08/19 shows tiny b/l pulmonary nodules, which are too small to characterize, will monitor.  6. Osteoporosis, Hypocalcemia  -Her DEXA from 09/21/19 which showed osteoporosis T score -3.1 at AP spine. Given the above, I switched her endocrine therapy from AI to tamoxifen.  -I started her on Zometa q59month for 2 years on 07/17/20.  -I again recommended her to start Calcium supplement in 06/2020.  -She will repeat DEXA with Gyn office in late 2022.   7. Low Weight  -I encouraged her to increase  calorie, protein and carbohydrate intake wo help her gain weight before breast surgery. -She notes she has been eating more red meat and carbs and has been able to gain more weight.  -Weight increased to 111 today (  01/02/21)  8. Thrombocytopenia -Secondary to liver cirrhosis and splenomegaly -I discussed the option of thrombopoietin agonist, such as Doptelet, before her breast surgery to reduce her risk of recurrence  -Platelets has improved to 77K today (01/02/21).   9. Anemia -She has had intermittent mild anemia.  -I discussed this could be bleeding from her Varices. She denies any obvious bleeding.  -Anemia worsened today with Hg 9.6 (01/02/21). I will check her iron panel and B12 today.  -I recommend she start OTC oral iron. Will repeat labs in 1 month   PLAN: -Lab today  -Proceed with Zometa today., she will restart calcium and vitD 2 tabs daily for her hypocalcemia  -Lab in 4 weeks for anemia f/u  -Continue Tamoxifen  -Lab and F/u in 6 months    No problem-specific Assessment & Plan notes found for this encounter.   Orders Placed This Encounter  Procedures  . Retic Panel    Standing Status:   Future    Number of Occurrences:   1    Standing Expiration Date:   01/02/2022  . Folate RBC    Standing Status:   Future    Number of Occurrences:   1    Standing Expiration Date:   01/02/2022  . Vitamin B12    Standing Status:   Future    Number of Occurrences:   1    Standing Expiration Date:   01/02/2022  . Methylmalonic acid, serum    Standing Status:   Future    Number of Occurrences:   1    Standing Expiration Date:   01/02/2022  . Ferritin    Standing Status:   Future    Number of Occurrences:   1    Standing Expiration Date:   01/02/2022   All questions were answered. The patient knows to call the clinic with any problems, questions or concerns. No barriers to learning was detected. The total time spent in the appointment was 30 minutes.     Truitt Merle, MD 01/02/2021   I,  Joslyn Devon, am acting as scribe for Truitt Merle, MD.   I have reviewed the above documentation for accuracy and completeness, and I agree with the above.

## 2021-01-02 ENCOUNTER — Encounter: Payer: Self-pay | Admitting: Hematology

## 2021-01-02 ENCOUNTER — Inpatient Hospital Stay: Payer: BC Managed Care – PPO

## 2021-01-02 ENCOUNTER — Other Ambulatory Visit: Payer: Self-pay

## 2021-01-02 ENCOUNTER — Telehealth: Payer: Self-pay | Admitting: Hematology

## 2021-01-02 ENCOUNTER — Inpatient Hospital Stay: Payer: BC Managed Care – PPO | Attending: Hematology | Admitting: Hematology

## 2021-01-02 VITALS — BP 117/77 | HR 83 | Temp 98.6°F | Resp 16 | Ht 63.0 in | Wt 111.8 lb

## 2021-01-02 DIAGNOSIS — C50111 Malignant neoplasm of central portion of right female breast: Secondary | ICD-10-CM | POA: Insufficient documentation

## 2021-01-02 DIAGNOSIS — D6959 Other secondary thrombocytopenia: Secondary | ICD-10-CM | POA: Insufficient documentation

## 2021-01-02 DIAGNOSIS — D649 Anemia, unspecified: Secondary | ICD-10-CM

## 2021-01-02 DIAGNOSIS — R162 Hepatomegaly with splenomegaly, not elsewhere classified: Secondary | ICD-10-CM | POA: Insufficient documentation

## 2021-01-02 DIAGNOSIS — F17210 Nicotine dependence, cigarettes, uncomplicated: Secondary | ICD-10-CM | POA: Insufficient documentation

## 2021-01-02 DIAGNOSIS — C220 Liver cell carcinoma: Secondary | ICD-10-CM | POA: Insufficient documentation

## 2021-01-02 DIAGNOSIS — M810 Age-related osteoporosis without current pathological fracture: Secondary | ICD-10-CM | POA: Diagnosis not present

## 2021-01-02 DIAGNOSIS — Z170 Estrogen receptor positive status [ER+]: Secondary | ICD-10-CM | POA: Diagnosis not present

## 2021-01-02 DIAGNOSIS — K580 Irritable bowel syndrome with diarrhea: Secondary | ICD-10-CM | POA: Diagnosis not present

## 2021-01-02 DIAGNOSIS — B1920 Unspecified viral hepatitis C without hepatic coma: Secondary | ICD-10-CM | POA: Insufficient documentation

## 2021-01-02 DIAGNOSIS — I8510 Secondary esophageal varices without bleeding: Secondary | ICD-10-CM | POA: Diagnosis not present

## 2021-01-02 DIAGNOSIS — Z923 Personal history of irradiation: Secondary | ICD-10-CM | POA: Insufficient documentation

## 2021-01-02 DIAGNOSIS — K766 Portal hypertension: Secondary | ICD-10-CM | POA: Diagnosis not present

## 2021-01-02 DIAGNOSIS — K7030 Alcoholic cirrhosis of liver without ascites: Secondary | ICD-10-CM | POA: Insufficient documentation

## 2021-01-02 DIAGNOSIS — Z79810 Long term (current) use of selective estrogen receptor modulators (SERMs): Secondary | ICD-10-CM | POA: Insufficient documentation

## 2021-01-02 DIAGNOSIS — Z79899 Other long term (current) drug therapy: Secondary | ICD-10-CM | POA: Diagnosis not present

## 2021-01-02 DIAGNOSIS — R232 Flushing: Secondary | ICD-10-CM | POA: Diagnosis not present

## 2021-01-02 LAB — IRON AND TIBC
Iron: 17 ug/dL — ABNORMAL LOW (ref 41–142)
Saturation Ratios: 4 % — ABNORMAL LOW (ref 21–57)
TIBC: 396 ug/dL (ref 236–444)
UIBC: 379 ug/dL (ref 120–384)

## 2021-01-02 LAB — CBC WITH DIFFERENTIAL (CANCER CENTER ONLY)
Abs Immature Granulocytes: 0.02 10*3/uL (ref 0.00–0.07)
Basophils Absolute: 0.1 10*3/uL (ref 0.0–0.1)
Basophils Relative: 1 %
Eosinophils Absolute: 0.1 10*3/uL (ref 0.0–0.5)
Eosinophils Relative: 3 %
HCT: 30.4 % — ABNORMAL LOW (ref 36.0–46.0)
Hemoglobin: 9.6 g/dL — ABNORMAL LOW (ref 12.0–15.0)
Immature Granulocytes: 0 %
Lymphocytes Relative: 24 %
Lymphs Abs: 1.1 10*3/uL (ref 0.7–4.0)
MCH: 29.3 pg (ref 26.0–34.0)
MCHC: 31.6 g/dL (ref 30.0–36.0)
MCV: 92.7 fL (ref 80.0–100.0)
Monocytes Absolute: 0.5 10*3/uL (ref 0.1–1.0)
Monocytes Relative: 11 %
Neutro Abs: 2.9 10*3/uL (ref 1.7–7.7)
Neutrophils Relative %: 61 %
Platelet Count: 77 10*3/uL — ABNORMAL LOW (ref 150–400)
RBC: 3.28 MIL/uL — ABNORMAL LOW (ref 3.87–5.11)
RDW: 15.9 % — ABNORMAL HIGH (ref 11.5–15.5)
WBC Count: 4.8 10*3/uL (ref 4.0–10.5)
nRBC: 0 % (ref 0.0–0.2)

## 2021-01-02 LAB — RETIC PANEL
Immature Retic Fract: 27.2 % — ABNORMAL HIGH (ref 2.3–15.9)
RBC.: 2.99 MIL/uL — ABNORMAL LOW (ref 3.87–5.11)
Retic Count, Absolute: 65.5 10*3/uL (ref 19.0–186.0)
Retic Ct Pct: 2.2 % (ref 0.4–3.1)
Reticulocyte Hemoglobin: 23.3 pg — ABNORMAL LOW (ref >27.9)

## 2021-01-02 LAB — CMP (CANCER CENTER ONLY)
ALT: 23 U/L (ref 0–44)
AST: 43 U/L — ABNORMAL HIGH (ref 15–41)
Albumin: 3.4 g/dL — ABNORMAL LOW (ref 3.5–5.0)
Alkaline Phosphatase: 77 U/L (ref 38–126)
Anion gap: 6 (ref 5–15)
BUN: 4 mg/dL — ABNORMAL LOW (ref 6–20)
CO2: 22 mmol/L (ref 22–32)
Calcium: 8 mg/dL — ABNORMAL LOW (ref 8.9–10.3)
Chloride: 112 mmol/L — ABNORMAL HIGH (ref 98–111)
Creatinine: 0.72 mg/dL (ref 0.44–1.00)
GFR, Estimated: >60 mL/min (ref >60)
Glucose, Bld: 102 mg/dL — ABNORMAL HIGH (ref 70–99)
Potassium: 3.7 mmol/L (ref 3.5–5.1)
Sodium: 140 mmol/L (ref 135–145)
Total Bilirubin: 0.5 mg/dL (ref 0.3–1.2)
Total Protein: 6.7 g/dL (ref 6.5–8.1)

## 2021-01-02 LAB — VITAMIN B12: Vitamin B-12: 624 pg/mL (ref 180–914)

## 2021-01-02 LAB — FERRITIN: Ferritin: 13 ng/mL (ref 11–307)

## 2021-01-02 MED ORDER — ZOLEDRONIC ACID 4 MG/100ML IV SOLN
INTRAVENOUS | Status: AC
  Filled 2021-01-02: qty 100

## 2021-01-02 MED ORDER — ZOLEDRONIC ACID 4 MG/100ML IV SOLN
4.0000 mg | Freq: Once | INTRAVENOUS | Status: AC
  Administered 2021-01-02: 4 mg via INTRAVENOUS

## 2021-01-02 MED ORDER — SODIUM CHLORIDE 0.9 % IV SOLN
Freq: Once | INTRAVENOUS | Status: AC
  Filled 2021-01-02: qty 250

## 2021-01-02 NOTE — Progress Notes (Signed)
Per [name], ok for treatment today with corrected calcium 8.48. Instructed pt. to take Calcium 2 tabs daily for two months then once daily.

## 2021-01-02 NOTE — Telephone Encounter (Signed)
Scheduled appointments per 2/4 los. Spoke to patient who is aware of appointments dates and times. Gave patient calendar print out.

## 2021-01-02 NOTE — Patient Instructions (Addendum)
Zoledronic Acid Injection (Hypercalcemia, Oncology) What is this medicine? ZOLEDRONIC ACID (ZOE le dron ik AS id) slows calcium loss from bones. It high calcium levels in the blood from some kinds of cancer. It may be used in other people at risk for bone loss. This medicine may be used for other purposes; ask your health care provider or pharmacist if you have questions. COMMON BRAND NAME(S): Zometa What should I tell my health care provider before I take this medicine? They need to know if you have any of these conditions:  cancer  dehydration  dental disease  kidney disease  liver disease  low levels of calcium in the blood  lung or breathing disease (asthma)  receiving steroids like dexamethasone or prednisone  an unusual or allergic reaction to zoledronic acid, other medicines, foods, dyes, or preservatives  pregnant or trying to get pregnant  breast-feeding How should I use this medicine? This drug is injected into a vein. It is given by a health care provider in a hospital or clinic setting. Talk to your health care provider about the use of this drug in children. Special care may be needed. Overdosage: If you think you have taken too much of this medicine contact a poison control center or emergency room at once. NOTE: This medicine is only for you. Do not share this medicine with others. What if I miss a dose? Keep appointments for follow-up doses. It is important not to miss your dose. Call your health care provider if you are unable to keep an appointment. What may interact with this medicine?  certain antibiotics given by injection  NSAIDs, medicines for pain and inflammation, like ibuprofen or naproxen  some diuretics like bumetanide, furosemide  teriparatide  thalidomide This list may not describe all possible interactions. Give your health care provider a list of all the medicines, herbs, non-prescription drugs, or dietary supplements you use. Also tell  them if you smoke, drink alcohol, or use illegal drugs. Some items may interact with your medicine. What should I watch for while using this medicine? Visit your health care provider for regular checks on your progress. It may be some time before you see the benefit from this drug. Some people who take this drug have severe bone, joint, or muscle pain. This drug may also increase your risk for jaw problems or a broken thigh bone. Tell your health care provider right away if you have severe pain in your jaw, bones, joints, or muscles. Tell you health care provider if you have any pain that does not go away or that gets worse. Tell your dentist and dental surgeon that you are taking this drug. You should not have major dental surgery while on this drug. See your dentist to have a dental exam and fix any dental problems before starting this drug. Take good care of your teeth while on this drug. Make sure you see your dentist for regular follow-up appointments. You should make sure you get enough calcium and vitamin D while you are taking this drug. Discuss the foods you eat and the vitamins you take with your health care provider. Check with your health care provider if you have severe diarrhea, nausea, and vomiting, or if you sweat a lot. The loss of too much body fluid may make it dangerous for you to take this drug. You may need blood work done while you are taking this drug. Do not become pregnant while taking this drug. Women should inform their health care provider   if they wish to become pregnant or think they might be pregnant. There is potential for serious harm to an unborn child. Talk to your health care provider for more information. What side effects may I notice from receiving this medicine? Side effects that you should report to your doctor or health care provider as soon as possible:  allergic reactions (skin rash, itching or hives; swelling of the face, lips, or tongue)  bone  pain  infection (fever, chills, cough, sore throat, pain or trouble passing urine)  jaw pain, especially after dental work  joint pain  kidney injury (trouble passing urine or change in the amount of urine)  low blood pressure (dizziness; feeling faint or lightheaded, falls; unusually weak or tired)  low calcium levels (fast heartbeat; muscle cramps or pain; pain, tingling, or numbness in the hands or feet; seizures)  low magnesium levels (fast, irregular heartbeat; muscle cramp or pain; muscle weakness; tremors; seizures)  low red blood cell counts (trouble breathing; feeling faint; lightheaded, falls; unusually weak or tired)  muscle pain  redness, blistering, peeling, or loosening of the skin, including inside the mouth  severe diarrhea  swelling of the ankles, feet, hands  trouble breathing Side effects that usually do not require medical attention (report to your doctor or health care provider if they continue or are bothersome):  anxious  constipation  coughing  depressed mood  eye irritation, itching, or pain  fever  general ill feeling or flu-like symptoms  nausea  pain, redness, or irritation at site where injected  trouble sleeping This list may not describe all possible side effects. Call your doctor for medical advice about side effects. You may report side effects to FDA at 1-800-FDA-1088. Where should I keep my medicine? This drug is given in a hospital or clinic. It will not be stored at home. NOTE: This sheet is a summary. It may not cover all possible information. If you have questions about this medicine, talk to your doctor, pharmacist, or health care provider.  2021 Elsevier/Gold Standard (2019-08-30 09:13:00)  Per [name], take Calcium supplement 2 tablets daily for two months then decrease to 1 tablet daily.

## 2021-01-03 LAB — AFP TUMOR MARKER: AFP, Serum, Tumor Marker: 4.1 ng/mL (ref 0.0–8.3)

## 2021-01-04 LAB — FOLATE RBC
Folate, Hemolysate: 342 ng/mL
Folate, RBC: 1239 ng/mL (ref >498)
Hematocrit: 27.6 % — ABNORMAL LOW (ref 34.0–46.6)

## 2021-01-05 ENCOUNTER — Telehealth: Payer: Self-pay

## 2021-01-05 NOTE — Telephone Encounter (Signed)
-----   Message from Truitt Merle, MD sent at 01/05/2021  7:23 AM EST ----- Please let pt know her iron level is low, please take OTC iron ferric sulfate 2 tabs daily, repeat lab in a month. Thanks  Truitt Merle  01/05/2021

## 2021-01-05 NOTE — Telephone Encounter (Signed)
Pt called message left for lab results medication changes with instructions pt encouraged to call back for any questions changes or concerns

## 2021-01-06 ENCOUNTER — Encounter: Payer: Self-pay | Admitting: Internal Medicine

## 2021-01-06 LAB — METHYLMALONIC ACID, SERUM: Methylmalonic Acid, Quantitative: 67 nmol/L (ref 0–378)

## 2021-03-30 ENCOUNTER — Encounter: Payer: Self-pay | Admitting: Podiatry

## 2021-03-30 ENCOUNTER — Ambulatory Visit: Payer: BC Managed Care – PPO | Admitting: Podiatry

## 2021-03-30 ENCOUNTER — Other Ambulatory Visit: Payer: Self-pay | Admitting: Podiatry

## 2021-03-30 ENCOUNTER — Ambulatory Visit (INDEPENDENT_AMBULATORY_CARE_PROVIDER_SITE_OTHER): Payer: BC Managed Care – PPO

## 2021-03-30 ENCOUNTER — Other Ambulatory Visit: Payer: Self-pay

## 2021-03-30 DIAGNOSIS — M2062 Acquired deformities of toe(s), unspecified, left foot: Secondary | ICD-10-CM

## 2021-03-30 DIAGNOSIS — M779 Enthesopathy, unspecified: Secondary | ICD-10-CM | POA: Diagnosis not present

## 2021-03-30 NOTE — Patient Instructions (Signed)

## 2021-04-01 NOTE — Progress Notes (Signed)
Subjective:   Patient ID: Theresa Rocha, female   DOB: 55 y.o.   MRN: [id number]   HPI 55 year old female presents the office today for concerns of pain to her left foot.  She said that she has pain on daily basis and she points along the medial first metatarsal along the area of bunion where she gets discomfort.  She states that she got a pedicure recently and she has had discomfort.  She is concerned that the pressure from the bunion is causing the skin to open up.  No open sores.  She is concerned that the screw from the prior surgery is backing out.  She tried offloading, padding with insignificant improvement.  Has had 3 other surgeries on her feet by other providers.     Review of Systems  All other systems reviewed and are negative.  Past Medical History:  Diagnosis Date  . Breast cancer ([place]) 08/30/2019  . Cancer [facility])    liver- just diagnosed, not started treatment yet  . Cervicalgia   . COPD (chronic obstructive pulmonary disease) ([place])   . GERD (gastroesophageal reflux disease)   . Headache   . Hepatic cirrhosis ([place])   . Hepatitis C    resolved 2018  . Hepatocellular carcinoma ([place])   . IBS (irritable bowel syndrome)   . Lactose intolerance   . Personal history of radiation therapy 11/2019   Right breast  . Portal hypertension ([place])   . Raynaud's syndrome   . Thrombocytopenia ([place])     Past Surgical History:  Procedure Laterality Date  . BREAST BIOPSY Right 09/2019  . BREAST LUMPECTOMY Right 09/2019  . BREAST LUMPECTOMY WITH AXILLARY LYMPH NODE BIOPSY Right 10/16/2019   Procedure: RIGHT BREAST LUMPECTOMY WITH SENTINEL LYMPH NODE BIOPSY;  Surgeon: Stark Klein, MD;  Location: [facility];  Service: General;  Laterality: Right;  . COLONOSCOPY    . HAMMER TOE SURGERY Left 2012   pins placed in great toe, 2nd,3rd, 4th toes, all pins removed except great toe  . IR PARACENTESIS  11/13/2019  . IR RADIOLOGIST EVAL & MGMT  08/16/2019  . IR RADIOLOGIST EVAL & MGMT   02/06/2020  . IR RADIOLOGIST EVAL & MGMT  06/26/2020  . IR RADIOLOGIST EVAL & MGMT  12/31/2020  . PELVIC LAPAROSCOPY Bilateral 1992  . UPPER GASTROINTESTINAL ENDOSCOPY  2002   had esophageal dilitation     Current Outpatient Medications:  .  baclofen (LIORESAL) 10 MG tablet, Take 10 mg by mouth 2 (two) times daily as needed., Disp: , Rfl:  .  dicyclomine (BENTYL) 20 MG tablet, Take one tablet every 4-6 hours as needed for abdominal cramping, Disp: 90 tablet, Rfl: 3 .  KLOR-CON M20 20 MEQ tablet, TAKE 1 TABLET BY MOUTH EVERY DAY, Disp: 30 tablet, Rfl: 3 .  loperamide (IMODIUM) 2 MG capsule, Take 2 mg by mouth daily as needed for diarrhea or loose stools. , Disp: , Rfl:  .  ondansetron (ZOFRAN) 8 MG tablet, Take 1 tablet (8 mg total) by mouth every 8 (eight) hours as needed for nausea or vomiting. (Patient not taking: Reported on 01/03/2020), Disp: 20 tablet, Rfl: 0 .  pantoprazole (PROTONIX) 40 MG tablet, Take 1 tablet (40 mg total) by mouth daily. Office visit for further refills, Disp: 30 tablet, Rfl: 2 .  promethazine (PHENERGAN) 12.5 MG tablet, Take 1 tablet (12.5 mg total) by mouth every 6 (six) hours as needed for nausea or vomiting., Disp: 20 tablet, Rfl: 0 .  tamoxifen (NOLVADEX) 20  MG tablet, TAKE 1 TABLET BY MOUTH EVERY DAY, Disp: 90 tablet, Rfl: 1  Allergies  Allergen Reactions  . Hydromorphone Hcl Nausea And Vomiting  . Aspirin Nausea Only         Objective:  Physical Exam  General: AAO x3, NAD  Dermatological: Small scab present to medial aspect of the first metatarsal head on the bunion but there is no edema, erythema measuring approximately signs of infection.  No pain.  Vascular: Dorsalis Pedis artery and Posterior Tibial artery pedal pulses are 2/4 bilateral with immedate capillary fill time. There is no pain with calf compression, swelling, warmth, erythema.   Neruologic: Grossly intact via light touch bilateral.   Musculoskeletal: Moderate bunion is present and there  is tenderness palpation along the medial first metatarsal head along with the first MPJ with range of motion.  Slight crepitation with range of motion.  Muscular strength 5/5 in all groups tested bilateral.  Gait: Unassisted, Nonantalgic.       Assessment:   55 year old female with symptomatic bunion left foot     Plan:  -Treatment options discussed including all alternatives, risks, and complications -Etiology of symptoms were discussed -X-rays were obtained and reviewed with the patient.  No evidence of acute fracture or stress fracture.  Screw from the prior surgery is intact.  Mild arthritic changes present the first MPJ. -I discussed with conservative as well as surgical treatment options.  At this point she was proceed with surgical intervention.  Discussed with her further revision bunion surgery and already having some mild arthritis present I recommended first MPJ arthrodesis.  Discussed trying to do reconstruction as well but will proceed with arthrodesis after discussion she agrees with this. -The incision placement as well as the postoperative course was discussed with the patient. I discussed risks of the surgery which include, but not limited to, infection, bleeding, pain, swelling, need for further surgery, delayed or nonhealing, painful or ugly scar, numbness or sensation changes, over/under correction, recurrence, transfer lesions, further deformity, hardware failure, DVT/PE, loss of toe/foot. Patient understands these risks and wishes to proceed with surgery. The surgical consent was reviewed with the patient all 3 pages were signed. No promises or guarantees were given to the outcome of the procedure. All questions were answered to the best of my ability. Before the surgery the patient was encouraged to call the office if there is any further questions. The surgery will be performed at the [facility] on an outpatient basis. -She is on tamoxifen.  We will get clearance from her  oncologist.  Will likely need to be on blood thinners postoperatively given increased risk of DVT.  Trula Slade DPM

## 2021-04-08 ENCOUNTER — Encounter: Payer: Self-pay | Admitting: Podiatry

## 2021-04-14 ENCOUNTER — Telehealth: Payer: Self-pay | Admitting: Hematology

## 2021-04-14 NOTE — Telephone Encounter (Signed)
Scheduled appt per 5/17 sch msg. Called pt, no answer. Left msg with appt date and time.  

## 2021-04-23 ENCOUNTER — Telehealth: Payer: Self-pay | Admitting: Urology

## 2021-04-23 NOTE — Telephone Encounter (Signed)
DOS - 05/20/21  HALLUX MPJ FUSION LEFT --- 17001   [employer] EFFECTIVE DATE - 12/01/15   PLAN DEDUCTIBLE - $600.00 W/ $600.00 REMAINING OUT OF POCKET - $3,600.00 W/ $1,710.00 COINSURANCE - 20% COPAY - $0.00   SPOKE WITH JOSE WITH [employer] AND HE STATED THAT FOR CPT CODE 74944 NO PRIOR AUTH IS REQUIRED.   REF # I - [id number]

## 2021-04-28 ENCOUNTER — Other Ambulatory Visit: Payer: BC Managed Care – PPO

## 2021-04-28 ENCOUNTER — Ambulatory Visit: Payer: BC Managed Care – PPO | Admitting: Hematology

## 2021-04-29 NOTE — Progress Notes (Signed)
[place]   Telephone:([phone]) [phone] [phone]) [phone]   Clinic Follow up Note   Patient Care Team: Seward Carol, MD as PCP - General (Internal Medicine) Arna Snipe, RN (Inactive) as Oncology Nurse Navigator Irene Shipper, MD as Consulting Physician (Gastroenterology) Truitt Merle, MD as Consulting Physician (Hematology) Alla Feeling, NP as Nurse Practitioner (Nurse Practitioner) Stark Klein, MD as Consulting Physician (General Surgery) Aletta Edouard, MD as Consulting Physician (Interventional Radiology) Roosevelt Locks, [place] as Nurse Practitioner (Nurse Practitioner) Mauro Kaufmann, RN as Oncology Nurse Navigator Rockwell Germany, RN as Oncology Nurse Navigator Eppie Gibson, MD as Attending Physician (Radiation Oncology)  Date of Service:  05/01/2021  CHIEF COMPLAINT: F/u of right breast cancer and liver cancer  SUMMARY OF ONCOLOGIC HISTORY: Oncology History Overview Note  Cancer Staging Hepatocellular carcinoma [facility]) Staging form: Liver, AJCC 8th Edition - Clinical stage from 08/02/2019: Stage II (cT2, cN0, cM0) - Signed by Truitt Merle, MD on 08/02/2019  Malignant neoplasm of central portion of right breast in female, estrogen receptor positive ([place]) Staging form: Breast, AJCC 8th Edition - Clinical stage from 08/29/2019: Stage IB (cT2, cN0, cM0, G2, ER+, PR+, HER2-) - Signed by Truitt Merle, MD on 09/05/2019    Hepatocellular carcinoma ([place])  07/04/2019 Imaging   CT AP IMPRESSION: 1. Findings of cirrhosis and hepatomegaly.  Patent portal vein. 2. Numerous varicosities as described above. 3. Moderate to large amount of abdominopelvic ascites 4. Contrast filled appendix which appears to be a mildly dilated with apparent wall thickening which could be due to surrounding ascites.   07/09/2019 Imaging   ABD US IMPRESSION: 1. Three small hypoechoic lesions in the RIGHT hepatic lobe measuring less than 1 cm but not evident on comparison contrast enhanced CT from  07/04/2019. Recommend MRI of the abdomen without and with contrast to evaluate these hepatic lesions in cirrhotic patient. 2. Increased liver echogenicity and lobular contour consistent cirrhosis. 3. Moderate mild to moderate volume of ascites. 4. Mild gallbladder wall thickening likely related to ascites. 5. No cholelithiasis or cholecystitis evident   07/26/2019 Imaging   MR ABD IMPRESSION: 1. In the lateral segment left hepatic lobe there are two LI-RADS category LR-5 lesions representing hepatocellular carcinoma. 2. There is also a LI-RADS category LR-3 lesion peripherally in the right hepatic lobe, intermediate probability for hepatocellular carcinoma. 3. Hepatic cirrhosis and diffuse wall thickening of the gallbladder. Widespread steatosis in the liver. 4. 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 5.  Aortic Atherosclerosis (ICD10-I70.0). 6. Widespread ascites and mesenteric edema.   07/26/2019 Tumor Marker   AFP 5.5 (baseline)   08/02/2019 Initial Diagnosis   Hepatocellular carcinoma ([place])   08/02/2019 Cancer Staging   Staging form: Liver, AJCC 8th Edition - Clinical stage from 08/02/2019: Stage II (cT2, cN0, cM0) - Signed by Truitt Merle, MD on 08/02/2019   08/07/2019 Imaging   CT Chest 08/07/19 IMPRESSION: 1. Tiny bilateral pulmonary nodules measuring up to 6 mm. These are too small to definitively characterize. Given the history of HCC, close follow-up recommended to ensure stability. 2.  Emphysema. (ICD10-J43.9)   08/09/2019 Procedure   Upper Endoscopy by Dr Henrene Pastor 08/09/19  IMPRESSION 1. Small esophageal varices 2. Mild diffuse portal hypertensive gastropathy 3. Otherwise normal EGD.   02/04/2020 Imaging   MRI abdomen  IMPRESSION: Stable exam. The small segment II lesions identified previously as LI-RADS category 5 are stable in size.   Previously characterized tiny LI-RADS 3 lesion in the inferior right liver is stable.   No  new suspicious focal  hepatic lesion on a background liver morphology consistent with cirrhosis.   Interval decrease in ascites with decrease and diffuse gallbladder wall thickening.     06/18/2020 Imaging   MRI Abdomen  IMPRESSION: 1. Stable 1.1 x 0.9 cm rounded lesion in the medial left lobe of the liver, hepatic segment II, demonstrating arterial phase hyperenhancement without washout or capsule. This has previously been described as a LI-RADS category 5 lesion based on prior observations, if categorized by current imaging features this is consistent with LI-RADS category 4. 2. Stable 1.5 x 1.2 cm lesion lateral and inferior to this lesion in hepatic segment II with very subtle evidence of washout but without capsular enhancement. LI-RADS category 5. 3. Stable 5 mm focus of early arterial phase hyperenhancement of the inferior right lobe of the liver, hepatic segment VI, without evidence of washout or capsule. This remains consistent with LI-RADS category 3. 4. No new suspicious lesions or contrast enhancement. 5. Cirrhotic morphology of the liver. 6. Trace perihepatic ascites.     Malignant neoplasm of central portion of right breast in female, estrogen receptor positive ([place])  08/29/2019 Cancer Staging   Staging form: Breast, AJCC 8th Edition - Clinical stage from 08/29/2019: Stage IB (cT2, cN0, cM0, G2, ER+, PR+, HER2-) - Signed by Truitt Merle, MD on 09/05/2019   08/29/2019 Mammogram   Diagnostic mammogram 08/29/19 IMPRESSION: Suspicious mass in the 12 o'clock region of the right breast 3 cm from the nipple measuring 3.2 x 1.6 x 2.6 cm   08/29/2019 Initial Biopsy   Diagnosis 08/29/19 Breast, right, needle core biopsy, 12 o'clock - INVASIVE DUCTAL CARCINOMA, SEE COMMENT.   08/29/2019 Receptors her2   Results: IMMUNOHISTOCHEMICAL AND MORPHOMETRIC ANALYSIS PERFORMED MANUALLY The tumor cells are NEGATIVE for Her2 (1+). Estrogen Receptor: 100%, POSITIVE, STRONG STAINING INTENSITY Progesterone  Receptor: 90%, POSITIVE, STRONG STAINING INTENSITY Proliferation Marker Ki67: 15%   09/05/2019 Initial Diagnosis   Malignant neoplasm of central portion of right breast in female, estrogen receptor positive ([place])   09/13/2019 Breast MRI   MRI breast 09/13/19 IMPRESSION: 1. Irregular spiculated enhancing mass in the right breast representing the patient's known malignancy. No abnormal right axillary lymph nodes. 2. Prominent left internal mammary lymph node is indeterminate. No abnormal appearing right internal mammary lymph nodes. 3. No evidence of malignancy in the left breast.   10/16/2019 Cancer Staging   Staging form: Breast, AJCC 8th Edition - Pathologic stage from 10/16/2019: Stage IA (pT2, pN0, cM0, G2, ER+, PR+, HER2-, Oncotype DX score: 21) - Signed by Gardenia Phlegm, NP on 11/07/2019   10/16/2019 Oncotype testing   Oncotype 10/16/19 Recurrence score 21 with 7% benefit of Tamoxifen or AI. There is less than 1 % benefit of chemo.    10/16/2019 Surgery   RIGHT BREAST LUMPECTOMY WITH SENTINEL LYMPH NODE BIOPSY by Dr Barry Dienes 10/16/19    10/16/2019 Pathology Results   FINAL MICROSCOPIC DIAGNOSIS: 10/16/19  A. BREAST, RIGHT, LUMPECTOMY:  - Invasive ductal carcinoma, 2.3 cm, Nottingham grade 2 of 3.  - Margins of resection are not involved (Closest margin: 1 mm,  anterior).  - See oncology table.   B. SENTINEL LYMPH NODE, RIGHT AXILLARY #1, BIOPSY:  - One lymph node, negative for carcinoma (0/1).   C. SENTINEL LYMPH NODE, RIGHT AXILLARY #2, BIOPSY:  - One lymph node, negative for carcinoma (0/1).    09/2019 -  Anti-estrogen oral therapy   Anastrozole 64m daily started in Nov 2020. Due to worsened knee pain/arthritis, she was  switched to Tamoxifen in 03/2020.    12/03/2019 - 12/31/2019 Radiation Therapy   Adjuvant RT with Dr Isidore Moos 12/03/19-12/31/19   04/01/2020 Survivorship   SCP delivered by virtual visit per Cira Rue, NP       CURRENT THERAPY:  Anastrozole  64m daily started in Nov 2020. Due to worsened knee pain/arthritis, she was switched to Tamoxifen in 03/2020.  Zometa q643monthstarting in 06/2020, held before 3rd dose on 05/01/21, due to mouth sores.   INTERVAL HISTORY:  TiCorona Popovichs here for a follow up of breast and liver cancer. She was last seen by me 4 months ago. She presents to the clinic alone. She notes she is doing well. She note she had 3 surges in her left foot (last 6 years ago), but due to recent bunion, she plans to have another foot surgery on 05/20/21. This causes her pain with certain shoes. She denies other vaginal bleeding. She notes mouth sores with Zometa infusion from her full dentures. Orajel mouthwash has helped. She has not been seen by dentist in years. She denies seeing bone exposure.     REVIEW OF SYSTEMS:   Constitutional: Denies fevers, chills or abnormal weight loss Eyes: Denies blurriness of vision Ears, nose, mouth, throat, and face: Denies mucositis or sore throat Respiratory: Denies cough, dyspnea or wheezes Cardiovascular: Denies palpitation, chest discomfort or lower extremity swelling Gastrointestinal:  Denies nausea, heartburn or change in bowel habits Skin: Denies abnormal skin rashes Lymphatics: Denies new lymphadenopathy or easy bruising Neurological:Denies numbness, tingling or new weaknesses Behavioral/Psych: Mood is stable, no new changes  All other systems were reviewed with the patient and are negative.  MEDICAL HISTORY:  Past Medical History:  Diagnosis Date  . [facility]  . [facility]   liver- just diagnosed, not started treatment yet  . Cervicalgia   . COPD (chronic obstructive pulmonary disease) ([facility]  . GERD (gastroesophageal reflux disease)   . Headache   . Hepatic cirrhosis ([facility]  . Hepatitis C    resolved 2018  . Hepatocellular carcinoma ([facility]  . IBS (irritable bowel syndrome)   . Lactose intolerance   . Personal history of radiation therapy  11/2019   Right breast  . Portal hypertension ([facility]  . Raynaud's syndrome   . Thrombocytopenia ([facility]    SURGICAL HISTORY: Past Surgical History:  Procedure Laterality Date  . BREAST BIOPSY Right 09/2019  . BREAST LUMPECTOMY Right 09/2019  . BREAST LUMPECTOMY WITH AXILLARY LYMPH NODE BIOPSY Right 10/16/2019   Procedure: RIGHT BREAST LUMPECTOMY WITH SENTINEL LYMPH NODE BIOPSY;  Surgeon: ByStark KleinMD;  [facility] Service: General;  Laterality: Right;  . COLONOSCOPY    . HAMMER TOE SURGERY Left 2012   pins placed in great toe, 2nd,3rd, 4th toes, all pins removed except great toe  . IR PARACENTESIS  11/13/2019  . IR RADIOLOGIST EVAL & MGMT  08/16/2019  . IR RADIOLOGIST EVAL & MGMT  02/06/2020  . IR RADIOLOGIST EVAL & MGMT  06/26/2020  . IR RADIOLOGIST EVAL & MGMT  12/31/2020  . PELVIC LAPAROSCOPY Bilateral 1992  . UPPER GASTROINTESTINAL ENDOSCOPY  2002   had esophageal dilitation    I have reviewed the social history and family history with the patient and they are unchanged from previous note.  ALLERGIES:  is allergic to hydromorphone hcl and aspirin.  MEDICATIONS:  Current Outpatient Medications  Medication Sig Dispense Refill  . baclofen (LIORESAL) 10 MG tablet  Take 10 mg by mouth 2 (two) times daily as needed.    . dicyclomine (BENTYL) 20 MG tablet Take one tablet every 4-6 hours as needed for abdominal cramping 90 tablet 3  . KLOR-CON M20 20 MEQ tablet TAKE 1 TABLET BY MOUTH EVERY DAY 30 tablet 3  . loperamide (IMODIUM) 2 MG capsule Take 2 mg by mouth daily as needed for diarrhea or loose stools.     . ondansetron (ZOFRAN) 8 MG tablet Take 1 tablet (8 mg total) by mouth every 8 (eight) hours as needed for nausea or vomiting. (Patient not taking: Reported on 01/03/2020) 20 tablet 0  . pantoprazole (PROTONIX) 40 MG tablet Take 1 tablet (40 mg total) by mouth daily. Office visit for further refills 30 tablet 2  . promethazine (PHENERGAN) 12.5 MG tablet Take 1 tablet (12.5  mg total) by mouth every 6 (six) hours as needed for nausea or vomiting. 20 tablet 0  . tamoxifen (NOLVADEX) 20 MG tablet TAKE 1 TABLET BY MOUTH EVERY DAY 90 tablet 1   No current facility-administered medications for this visit.    PHYSICAL EXAMINATION: ECOG PERFORMANCE STATUS: 0 - Asymptomatic  Vitals:   05/01/21 1148  BP: 127/78  Pulse: 78  Resp: 13  Temp: 99.6 F (37.6 C)  SpO2: 99%   Filed Weights   05/01/21 1148  Weight: 111 lb 8 oz (50.6 kg)    GENERAL:alert, no distress and comfortable SKIN: skin color, texture, turgor are normal, no rashes or significant lesions EYES: normal, Conjunctiva are pink and non-injected, sclera clear  NECK: supple, thyroid normal size, non-tender, without nodularity LYMPH:  no palpable lymphadenopathy in the cervical, axillary  LUNGS: clear to auscultation and percussion with normal breathing effort HEART: regular rate & rhythm and no murmurs and no lower extremity edema ABDOMEN:abdomen soft, non-tender and normal bowel sounds Musculoskeletal:no cyanosis of digits and no clubbing  NEURO: alert & oriented x 3 with fluent speech, no focal motor/sensory deficits BREAST: S/p right lumpectomy: surgical incision healed well. No palpable mass, nodules or adenopathy bilaterally. Breast exam benign.   LABORATORY DATA:  I have reviewed the data as listed CBC Latest Ref Rng & Units 05/01/2021 01/02/2021 01/02/2021  WBC 4.0 - 10.5 K/uL 5.6 4.8 -  Hemoglobin 12.0 - 15.0 g/dL 11.0(L) 9.6(L) -  Hematocrit 36.0 - 46.0 % 35.6(L) 27.6(L) 30.4(L)  Platelets 150 - 400 K/uL 68(L) 77(L) -     CMP Latest Ref Rng & Units 05/01/2021 01/02/2021 10/02/2020  Glucose 70 - 99 mg/dL 107(H) 102(H) 100(H)  BUN 6 - 20 mg/dL 4(L) 4(L) 6  Creatinine 0.44 - 1.00 mg/dL 0.76 0.72 0.77  Sodium 135 - 145 mmol/L 139 140 141  Potassium 3.5 - 5.1 mmol/L 3.7 3.7 3.9  Chloride 98 - 111 mmol/L 104 112(H) 106  CO2 22 - 32 mmol/L _0 Calcium 8.9 - 10.3 mg/dL 8.6(L) 8.0(L) 8.5(L)   Total Protein 6.5 - 8.1 g/dL 7.5 6.7 7.8  Total Bilirubin 0.3 - 1.2 mg/dL 1.3(H) 0.5 0.9  Alkaline Phos 38 - 126 U/L 82 77 89  AST 15 - 41 U/L 76(H) 43(H) 60(H)  ALT 0 - 44 U/L _1 RADIOGRAPHIC STUDIES: I have personally reviewed the radiological images as listed and agreed with the findings in the report. No results found.   ASSESSMENT & PLAN:  Theresa Rocha is a 55 y.o. female with   1. Malignant neoplasm of central portion of right breast, Stage  II, c(T2N0M0), ER/PR+, HER2-, Grade II, RS 21 -She was diagnosed in 07/2019. She underwent right lumpectomy with SLNBand adjuvant Radiation.  -Given low risk disease with Recurrence Score 21, adjuvant chemo was not recommended.  -Shecompletedadjuvant RT with Dr Squire1/4/21-2/1/21to reduce risk of local breast cancer.  -She started adjuvant anastrozole in November 2020, has been tolerating well.Due to knee joint pain, her anastrozole was switched to Tamoxifen. She is tolerating with hot flashes well. Will continue to complete 10 years.  -From a breast cancer standpoint, She is clinically doing well. Lab reviewed, her CBC and CMP are within normal limits except Hg 11, plt 68K, BG 107, Ca 8.6, AST 76, tbili 1.3.  Breast exam unremarkable.  -Continue Surveillance. Next Mammogram in 09/2021.  -Continue Tamoxifen -f/u in 6 months   2.Multifocal Hepatocellular carcinoma, cT2N0Mx, stage II -She initially presented with multifocal lesions of liver consistent with [place] with indeterminate right lobe lesions. Her AFPwas in normal range.  -Given her history of liver disease and imaging findings, her diagnosis of HCC is quite certain.Her case was reviewed in GI tumor board and a biopsy was not felt to be needed to confirm the diagnosis.She has what appears to be early stage disease, no evidence of distant metastasis -She was seen by liver clinic [facility]raz[facility] to discuss liver transplant.She started workup and screening for  transplant when she was diagnosed with right breast cancer. Unfortunately the breast cancer diagnosis and treatment will likely put her liver transplant on hold.  -She was seen by Dr. Barry Dienes and Dr. Kathlene Cote, unfortunately she was felt to be not a good candidate for surgical resection or liver targeted ablation at this point due to her liver function and location of tumors.  -We also previously discussed her case in GI tumor board, SBRT radiation could be considered if theliver targetedradiation is not feasible. She is currently on observation.  -She will continue to f/u for close observation. She will continue to f/u with Roosevelt Locks every 6 months. -Plan for next MRI scan in 06/2021.   3. 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  -s/p harvoni and ribavirin treatment for Hep C per Dr. Bradd Burner in 2018 -08/09/19 Upper endoscopy did show small Varices, no bleeding. -Her Ascites have resolved. She is no longer on diuretics. -Continue to F/u with Dr Henrene Pastor and [facility]  4. Alcohol and smoking Cessation  -Shehas h/o smoking0.5 PPD x35 years. She drank 3-4 cans beer nightly x20 years. -Cessation of both was discussed and strongly encouraged.She has stopped drinking alcohol but smokes 5 cigarettes a day still. I encouraged her to continue reducing until she quits completely. -CT chest from 08/08/19 shows tiny b/l pulmonary nodules, which are too small to characterize, will monitor.  5. Osteoporosis, Hypocalcemia -Her DEXA from10/23/20 which showed osteoporosis T score -3.1 at [facility] spine. Given the above, Iswitched herendocrine therapy from AI to tamoxifen.  -I started her on Zometa q32month for 2 years on 07/17/20. Held before 3rd dose on 05/01/21, due to mouth sores. She has full sets of dentures and has not seen dentist in years. I highly recommend she a dentist soon for evaluation.  -I again  recommended her to start Calcium supplement daily  -She will repeat DEXA with Gyn office in late 2022.   6. Low Weight  -I encouraged her to increase calorie, protein and carbohydrate intake wo help her gain weight before breast surgery. -She notes she  has been eating more red meat and carbs and has been able to gain more weight.  -Weight is stable at 111 pounds (05/01/21)  7. Thrombocytopenia, Pending left foot surgery -Secondary to liver cirrhosis and splenomegaly -I discussed the option of thrombopoietin agonist, such as Doptelet, before her breast surgery to reduce her risk of recurrence -Platelet ct is 68K today (05/01/21). She does plan to have another left foot surgery with Dr Jacqualyn Posey on 05/20/21. I discussed with this superficial surgery, this may not lead to severe blood loss. Treatment for thrombocytopenia is indicated for plt under 50K. I will discuss this and possibility of low dose blood thinner after surgery with Dr Jacqualyn Posey. If this is a concern, may prescribe TPO before surgery to increase her platelets.  -Given risk of blood clots after foot surgery, I will also hold Tamoxifen from 05/11/21 and she can restart in early July, 2022 when she recovers well from surgery. -Patient was asked about prophylactic anticoagulation after for surgery.  Due to her moderate risk of bleeding, I will only recommend low-dose anticoagulation.  8. Anemia -She has had intermittent mild anemia.  -I discussed this could be bleeding from her Varices. She denies any obvious bleeding.  -Anemia improved to 11 today (05/01/21). I previously recommend she start oral iron.   -I recommend she start OTC oral iron.   PLAN: -MRI abdomen in 06/2021,and f/u with Dr. Kathlene Cote after MRI  -Mammogram in 09/2021 -hold Tamoxifen for now dur to pending foot surgery and restart in 2-4 weeks after surgery when she is able to walk without limitation  -Lab and F/u in 6 months  -will communicate with Dr Jacqualyn Posey   No  problem-specific Assessment & Plan notes found for this encounter.   Orders Placed This Encounter  Procedures  . MR Abdomen W Wo Contrast    Standing Status:   Future    Standing Expiration Date:   05/01/2022    Order Specific Question:   If indicated for the ordered procedure, I authorize the administration of contrast media per Radiology protocol    Answer:   Yes    Order Specific Question:   What is the patient's sedation requirement?    Answer:   No Sedation    Order Specific Question:   Does the patient have a pacemaker or implanted devices?    Answer:   No    Order Specific Question:   Preferred imaging location?    Answer:   [facility] (table limit-550lbs)  . MM DIAG BREAST TOMO BILATERAL    Standing Status:   Future    Standing Expiration Date:   05/01/2022    Order Specific Question:   Reason for Exam (SYMPTOM  OR DIAGNOSIS REQUIRED)    Answer:   screening    Order Specific Question:   Preferred imaging location?    Answer:   [facility]    Order Specific Question:   Release to patient    Answer:   Immediate    Order Specific Question:   Is the patient pregnant?    Answer:   No   All questions were answered. The patient knows to call the clinic with any problems, questions or concerns. No barriers to learning was detected. The total time spent in the appointment was 30 minutes.     Truitt Merle, MD 05/01/2021   I, Joslyn Devon, am acting as scribe for Truitt Merle, MD.   I have reviewed the above documentation for accuracy and completeness, and  I agree with the above.

## 2021-05-01 ENCOUNTER — Encounter: Payer: Self-pay | Admitting: Hematology

## 2021-05-01 ENCOUNTER — Inpatient Hospital Stay: Payer: BC Managed Care – PPO | Attending: Hematology

## 2021-05-01 ENCOUNTER — Other Ambulatory Visit: Payer: Self-pay

## 2021-05-01 ENCOUNTER — Inpatient Hospital Stay: Payer: BC Managed Care – PPO | Admitting: Hematology

## 2021-05-01 VITALS — BP 127/78 | HR 78 | Temp 99.6°F | Resp 13 | Ht 63.0 in | Wt 111.5 lb

## 2021-05-01 DIAGNOSIS — D649 Anemia, unspecified: Secondary | ICD-10-CM | POA: Diagnosis not present

## 2021-05-01 DIAGNOSIS — C220 Liver cell carcinoma: Secondary | ICD-10-CM

## 2021-05-01 DIAGNOSIS — Z79810 Long term (current) use of selective estrogen receptor modulators (SERMs): Secondary | ICD-10-CM | POA: Insufficient documentation

## 2021-05-01 DIAGNOSIS — D6959 Other secondary thrombocytopenia: Secondary | ICD-10-CM | POA: Diagnosis not present

## 2021-05-01 DIAGNOSIS — K7031 Alcoholic cirrhosis of liver with ascites: Secondary | ICD-10-CM | POA: Diagnosis not present

## 2021-05-01 DIAGNOSIS — Z170 Estrogen receptor positive status [ER+]: Secondary | ICD-10-CM | POA: Insufficient documentation

## 2021-05-01 DIAGNOSIS — C50111 Malignant neoplasm of central portion of right female breast: Secondary | ICD-10-CM

## 2021-05-01 LAB — CBC WITH DIFFERENTIAL (CANCER CENTER ONLY)
Abs Immature Granulocytes: 0.02 10*3/uL (ref 0.00–0.07)
Basophils Absolute: 0.1 10*3/uL (ref 0.0–0.1)
Basophils Relative: 1 %
Eosinophils Absolute: 0.1 10*3/uL (ref 0.0–0.5)
Eosinophils Relative: 2 %
HCT: 35.6 % — ABNORMAL LOW (ref 36.0–46.0)
Hemoglobin: 11 g/dL — ABNORMAL LOW (ref 12.0–15.0)
Immature Granulocytes: 0 %
Lymphocytes Relative: 20 %
Lymphs Abs: 1.1 10*3/uL (ref 0.7–4.0)
MCH: 24.6 pg — ABNORMAL LOW (ref 26.0–34.0)
MCHC: 30.9 g/dL (ref 30.0–36.0)
MCV: 79.6 fL — ABNORMAL LOW (ref 80.0–100.0)
Monocytes Absolute: 0.6 10*3/uL (ref 0.1–1.0)
Monocytes Relative: 11 %
Neutro Abs: 3.7 10*3/uL (ref 1.7–7.7)
Neutrophils Relative %: 66 %
Platelet Count: 68 10*3/uL — ABNORMAL LOW (ref 150–400)
RBC: 4.47 MIL/uL (ref 3.87–5.11)
RDW: 22.5 % — ABNORMAL HIGH (ref 11.5–15.5)
WBC Count: 5.6 10*3/uL (ref 4.0–10.5)
nRBC: 0 % (ref 0.0–0.2)

## 2021-05-01 LAB — IRON AND TIBC
Iron: 37 ug/dL — ABNORMAL LOW (ref 41–142)
Saturation Ratios: 8 % — ABNORMAL LOW (ref 21–57)
TIBC: 451 ug/dL — ABNORMAL HIGH (ref 236–444)
UIBC: 414 ug/dL — ABNORMAL HIGH (ref 120–384)

## 2021-05-01 LAB — CMP (CANCER CENTER ONLY)
ALT: 31 U/L (ref 0–44)
AST: 76 U/L — ABNORMAL HIGH (ref 15–41)
Albumin: 3.5 g/dL (ref 3.5–5.0)
Alkaline Phosphatase: 82 U/L (ref 38–126)
Anion gap: 8 (ref 5–15)
BUN: 4 mg/dL — ABNORMAL LOW (ref 6–20)
CO2: 27 mmol/L (ref 22–32)
Calcium: 8.6 mg/dL — ABNORMAL LOW (ref 8.9–10.3)
Chloride: 104 mmol/L (ref 98–111)
Creatinine: 0.76 mg/dL (ref 0.44–1.00)
GFR, Estimated: >60 mL/min (ref >60)
Glucose, Bld: 107 mg/dL — ABNORMAL HIGH (ref 70–99)
Potassium: 3.7 mmol/L (ref 3.5–5.1)
Sodium: 139 mmol/L (ref 135–145)
Total Bilirubin: 1.3 mg/dL — ABNORMAL HIGH (ref 0.3–1.2)
Total Protein: 7.5 g/dL (ref 6.5–8.1)

## 2021-05-02 LAB — AFP TUMOR MARKER: AFP, Serum, Tumor Marker: 3.8 ng/mL (ref 0.0–9.2)

## 2021-05-12 ENCOUNTER — Other Ambulatory Visit: Payer: Self-pay | Admitting: Hematology

## 2021-05-12 ENCOUNTER — Telehealth: Payer: Self-pay | Admitting: Hematology

## 2021-05-12 ENCOUNTER — Encounter: Payer: Self-pay | Admitting: Hematology

## 2021-05-12 ENCOUNTER — Other Ambulatory Visit (HOSPITAL_COMMUNITY): Payer: Self-pay

## 2021-05-12 ENCOUNTER — Telehealth: Payer: Self-pay | Admitting: Pharmacist

## 2021-05-12 ENCOUNTER — Telehealth: Payer: Self-pay

## 2021-05-12 DIAGNOSIS — I8500 Esophageal varices without bleeding: Secondary | ICD-10-CM | POA: Insufficient documentation

## 2021-05-12 MED ORDER — DOPTELET 20 MG PO TABS
40.0000 mg | ORAL_TABLET | Freq: Every day | ORAL | 0 refills | Status: AC
  Filled 2021-05-12: qty 10, 5d supply, fill #0

## 2021-05-12 NOTE — Telephone Encounter (Signed)
This nurse spoke with patient who called and stated that she is having foot surgery on 6/22 and [name] is supposed to call in a medication for her to take prior to the surgery for low platelets.  This nurse advised patient that Dopelet was called in to [facility] outpatient pharmacy today.  Patient stated that she will pick it up and start taking it on 6/15.  Patient also stated that she needs the order for her follow up MRI to be sent to [facility] because she is being followed by Dr. Kathlene Cote.  This nurse advised patient that information would be forwarded to [name]. Patient knows to call clinic with any problems, questons or concerns.

## 2021-05-12 NOTE — Telephone Encounter (Signed)
Oral Oncology Patient Advocate Encounter   Received notification from [employer] that prior authorization for Doptelet is required.   PA submitted on CoverMyMeds Key [id number] Status is pending   Oral Oncology Clinic will continue to follow.   [place] Patient [place] Phone [phone] Fax [phone] 05/12/2021 1:19 PM

## 2021-05-12 NOTE — Telephone Encounter (Signed)
Oral Oncology Pharmacist Encounter  Received new prescription for Doptelet (avatrombopag) for the treatment of thrombocytopenia secondary to liver cirrhosis and splenomegaly, planned duration 5 days.  Prescription dose and frequency assessed for appropriateness.  CBC w/ Diff and CMP from 05/01/21 assessed, noted pltc 68 K/uL, AST 76 U/L and T. Bili 1.3 mg/dL - no hepatic dose adjustments required.  Current medication list in Epic reviewed, no relevant/significant DDIs with Doptelet identified.  Evaluated chart and no patient barriers to medication adherence noted.   Patient agreement for treatment documented in MD note on 05/01/21.  Prescription has been e-scribed to the [facility] for benefits analysis and approval.  Oral Oncology Clinic will continue to follow for insurance authorization, copayment issues, initial counseling and start date.  Leron Croak, PharmD, BCPS Hematology/Oncology Clinical Pharmacist [facility] [phone] 05/12/2021 1:46 PM

## 2021-05-12 NOTE — Telephone Encounter (Signed)
Dr. Jacqualyn Posey called me back and we discussed her risk of bleeding during surgery and risk of thrombosis after surgery. I have stopped her Tamoxifen. Due to her moderate risk of bleeding from surgery, I will called in Doptelet 40mg  daily for 5 days 10-13 days before surgery. She will be put on Xarelto 10mg  daily post-op to reduce her risk of thrombosis. Dr. Jacqualyn Posey will postpone her surgery and we will coordinate her date of surgery when she receives Doptelet, which I called in to [place] today. My nurse will call pt to review the plan with her. I also informed oral pharmacist Wells Guiles to help processing her prescription.   Truitt Merle  05/12/2021

## 2021-05-13 ENCOUNTER — Other Ambulatory Visit (HOSPITAL_COMMUNITY): Payer: Self-pay

## 2021-05-13 ENCOUNTER — Telehealth: Payer: Self-pay | Admitting: Podiatry

## 2021-05-13 DIAGNOSIS — C220 Liver cell carcinoma: Secondary | ICD-10-CM | POA: Diagnosis not present

## 2021-05-13 DIAGNOSIS — K7469 Other cirrhosis of liver: Secondary | ICD-10-CM | POA: Diagnosis not present

## 2021-05-13 DIAGNOSIS — I8500 Esophageal varices without bleeding: Secondary | ICD-10-CM | POA: Diagnosis not present

## 2021-05-13 DIAGNOSIS — Z9189 Other specified personal risk factors, not elsewhere classified: Secondary | ICD-10-CM | POA: Diagnosis not present

## 2021-05-13 NOTE — Telephone Encounter (Signed)
Pt called and stated that she is still waiting on her platelet medication. She spoke to her liver specialist this morning and they stated her platelets are at 68. Her liver specialist it was not a major concern. Patient is concerned about getting her sx done. Please call patient.

## 2021-05-13 NOTE — Telephone Encounter (Signed)
Oral Oncology Pharmacist Encounter   Prior Authorization for Doptelet (avatrombopag) has been denied.     Will proceed with appeal process at this time.   Oral Oncology Clinic will continue to follow.    Leron Croak, PharmD, BCPS Hematology/Oncology Clinical Pharmacist [facility] [phone] 05/13/2021 10:39 AM

## 2021-05-14 ENCOUNTER — Telehealth: Payer: Self-pay

## 2021-05-14 NOTE — Telephone Encounter (Signed)
-----   Message from Truitt Merle, MD sent at 05/14/2021 12:20 PM EDT ----- That's OK, thanks   Santiago Glad, please let pt know we are not able to get the medicine for her, OK to proceed with her foot surgery, I think her risk of bleeding is not very high given her current plt count, I will let her surgeon know   Krista Blue  ----- Message ----- From: Britt Boozer, [facility] Sent: 05/14/2021   7:47 AM EDT To: Truitt Merle, MD, Royston Bake, RN, [facility]  Good Morning!  Unfortunately they have upheld the appeal and will not provide coverage for Ms. Kowal as long as she has a pltc > 50 K/uL.  Thanks, Wells Guiles  ----- Message ----- From: Truitt Merle, MD Sent: 05/12/2021   4:22 PM EDT To: Britt Boozer, RPH, Royston Bake, RN, #  Do you have any samples? She only needs 10 tabs of 20mg . If not, let's try appeal. If not successful, will let her proceed with her foot surgery, I think the risk of bleeding is not very high  Thanks  Krista Blue  ----- Message ----- From: Britt Boozer, [facility] Sent: 05/12/2021   3:32 PM EDT To: Truitt Merle, MD, Royston Bake, RN, [facility]  Hi [name],  We just received notification that prior authorization was denied for Doptelet since her pltc was not < 50K on most recent labs. We can proceed with the appeal process if you would like, but that will take up to 72 hours for response.  Thanks, Wells Guiles ----- Message ----- From: Truitt Merle, MD Sent: 05/12/2021  12:43 PM EDT To: Britt Boozer, RPH, Royston Bake, RN, #  My nurse, pleas let pt know that I spoke with her podiatrist Dr. Jacqualyn Posey today. He will postpone her foot surgery. To improve her thrombocytopenia, I called in Doptelet to [facility] today, she will take 2 tabs daily for 5 days, 10-13 days before her surgery. Please let her call us when she receiveds the medicine, we will call Dr. Leigh Aurora office so they can schedule her surgery and we will let her know when to start.   Wells Guiles, please help to get it filled,  thanks   Krista Blue

## 2021-05-14 NOTE — Telephone Encounter (Signed)
Oral Oncology Pharmacist Encounter   Notified by [employer] that appeal for Doptelet has been upheld.  They are unable to provide coverage for the patient at this time due to patient currently having a platelet count > 50 K/uL.    MD notified about insurance determination.    Leron Croak, PharmD, BCPS Hematology/Oncology Clinical Pharmacist [facility] [phone] 05/14/2021 7:45 AM

## 2021-05-14 NOTE — Telephone Encounter (Signed)
I spoke with Ms Koc and relayed Dr Ernestina Penna comments and recommendations.  MS Quirk verbalized understanding.

## 2021-05-18 ENCOUNTER — Telehealth: Payer: Self-pay | Admitting: *Deleted

## 2021-05-18 NOTE — Telephone Encounter (Signed)
Patient is wanting to ask for a something for nausea to go along with pain medication for  her surgery on Wednesday.Possible Zofran. Please advise.

## 2021-05-20 ENCOUNTER — Other Ambulatory Visit: Payer: Self-pay | Admitting: Podiatry

## 2021-05-20 ENCOUNTER — Encounter: Payer: Self-pay | Admitting: Podiatry

## 2021-05-20 DIAGNOSIS — M21612 Bunion of left foot: Secondary | ICD-10-CM | POA: Diagnosis not present

## 2021-05-20 DIAGNOSIS — M7752 Other enthesopathy of left foot: Secondary | ICD-10-CM | POA: Diagnosis not present

## 2021-05-20 DIAGNOSIS — M25572 Pain in left ankle and joints of left foot: Secondary | ICD-10-CM | POA: Diagnosis not present

## 2021-05-20 DIAGNOSIS — M7751 Other enthesopathy of right foot: Secondary | ICD-10-CM | POA: Diagnosis not present

## 2021-05-20 MED ORDER — RIVAROXABAN 10 MG PO TABS: 10.0000 mg | ORAL_TABLET | Freq: Every day | ORAL | 0 refills | Status: DC

## 2021-05-20 MED ORDER — PROMETHAZINE HCL 25 MG PO TABS: 25.0000 mg | ORAL_TABLET | Freq: Three times a day (TID) | ORAL | 0 refills | Status: DC | PRN

## 2021-05-20 MED ORDER — CEPHALEXIN 500 MG PO CAPS: 500.0000 mg | ORAL_CAPSULE | Freq: Three times a day (TID) | ORAL | 0 refills | Status: DC

## 2021-05-20 MED ORDER — HYDROCODONE-ACETAMINOPHEN 10-325 MG PO TABS: 1.0000 | ORAL_TABLET | ORAL | 0 refills | Status: DC | PRN

## 2021-05-20 NOTE — Progress Notes (Signed)
Postop medications sent  She has been on hydrocodone before and did well with it. Spoke oncologist and will do xarelto 10mg  daily for DVT/PE  prevention.   She has clearance from her liver specialist and oncologist for surgery.   Patient understands risks and wants to proceed.

## 2021-05-21 ENCOUNTER — Telehealth: Payer: Self-pay | Admitting: Podiatry

## 2021-05-21 NOTE — Telephone Encounter (Signed)
Patient called our office stating she had surgery yesterday and wants to know when can she take the boot off when resting on the couch. She states the boot is extremely heavy and making the pain worst.

## 2021-05-21 NOTE — Telephone Encounter (Signed)
I called Theresa Rocha back and left her a voicemail stating  while awake and laying down she can take the boot off for a short amount of time or unstrap it.  And If she has a tall boot we can try to get her a smaller one when she comes in. And to also wear the boot anytime she is up or sleeping. I told her if she has anymore questions or concern to please give [place] a call back.

## 2021-05-22 ENCOUNTER — Telehealth: Payer: Self-pay

## 2021-05-22 ENCOUNTER — Telehealth: Payer: Self-pay | Admitting: *Deleted

## 2021-05-22 ENCOUNTER — Other Ambulatory Visit: Payer: Self-pay | Admitting: Podiatry

## 2021-05-22 DIAGNOSIS — M21619 Bunion of unspecified foot: Secondary | ICD-10-CM

## 2021-05-22 NOTE — Telephone Encounter (Signed)
Placed order for knee scooter with [employer]. Notified Crystalmarie that someone from [facility] will be in contact with her today.

## 2021-05-22 NOTE — Telephone Encounter (Signed)
Called and spoke with the patient and stated that I was calling to see how the patient was doing after surgery with Dr Jacqualyn Posey and patient stated that is was some what painful but over all doing good and the boot was a little tight and I stated to take the boot off and release some of the pressure and when patient was getting up patient had to have the boot on and ice and elevate and could wiggle the toes and not any fever or chills and not any nausea and the nerve block wore off yesterday morning at 1:30 am and to not be on it more than 15 minutes on the hour and to call the office if any concerns or questions. Theresa Rocha

## 2021-05-25 ENCOUNTER — Ambulatory Visit (INDEPENDENT_AMBULATORY_CARE_PROVIDER_SITE_OTHER): Payer: BC Managed Care – PPO

## 2021-05-25 ENCOUNTER — Encounter: Payer: BC Managed Care – PPO | Admitting: Podiatry

## 2021-05-25 ENCOUNTER — Other Ambulatory Visit: Payer: Self-pay

## 2021-05-25 ENCOUNTER — Ambulatory Visit (INDEPENDENT_AMBULATORY_CARE_PROVIDER_SITE_OTHER): Payer: BC Managed Care – PPO | Admitting: Podiatry

## 2021-05-25 DIAGNOSIS — Z9889 Other specified postprocedural states: Secondary | ICD-10-CM | POA: Diagnosis not present

## 2021-05-25 MED ORDER — HYDROCODONE-ACETAMINOPHEN 10-325 MG PO TABS: 1.0000 | ORAL_TABLET | ORAL | 0 refills | Status: AC | PRN

## 2021-05-25 MED ORDER — PROMETHAZINE HCL 25 MG PO TABS: 25.0000 mg | ORAL_TABLET | Freq: Three times a day (TID) | ORAL | 0 refills | Status: DC | PRN

## 2021-05-25 NOTE — Progress Notes (Signed)
   Subjective:  Patient presents today status post left great toe arthrodesis. DOS: 05/20/2021.  Patient states that she feels a lot of pressure and pain in her foot.  Currently the pain is at 6/10.  She is taking hydrocodone with Phenergan postoperatively.  She is also currently on Keflex 500 mg TID for postoperative prophylaxis.  She presents for further treatment and evaluation  Past Medical History:  Diagnosis Date   Breast cancer ([place]) 08/30/2019   Cancer ([place])    liver- just diagnosed, not started treatment yet   Cervicalgia    COPD (chronic obstructive pulmonary disease) (HCC)    GERD (gastroesophageal reflux disease)    Headache    Hepatic cirrhosis (HCC)    Hepatitis C    resolved 2018   Hepatocellular carcinoma (H[facility])    IBS (irritable bowel syndrome)    Lactose intolerance    Personal history of radiation therapy 11/2019   Right breast   Portal hypertension ([place])    Raynaud's syndrome    Thrombocytopenia ([place])       Objective/Physical Exam Neurovascular status intact.  Skin incisions appear to be well coapted with sutures intact. No sign of infectious process noted. No dehiscence. No active bleeding noted.  There is heavy edema with ecchymosis noted diffusely throughout the foot.  Patient is on anticoagulant medication therapy.  Radiographic Exam:  Orthopedic hardware and arthrodesis site appears to be stable with routine healing.  Good alignment of the first ray.  Good apposition of the arthrodesis site.  Assessment: 1. s/p left first MTPJ arthrodesis. DOS: 05/20/2021   Plan of Care:  1. Patient was evaluated. X-rays reviewed 2.  Dressings changed.  Clean dry and intact x1 week 3.  Continue Keflex 500 mg TID as prescribed 4.  Refill prescription for Vicodin 10/325 mg Q4H #30 PRN pain 5.  Refill prescription for Phenergan 25 mg as needed #20 6.  Continue nonweightbearing in the cam boot. 7.  Knee scooter pending.  Knee scooter is medically necessary.  Patient is  strictly nonweightbearing left lower extremity postoperatively 8.  Return to clinic 1 week with Dr. Debbe Bales, DPM [facility]  Dr. Edrick Kins, DPM    [address], [place] [address]                Office [phone]  Fax [phone]

## 2021-06-04 ENCOUNTER — Ambulatory Visit (INDEPENDENT_AMBULATORY_CARE_PROVIDER_SITE_OTHER): Payer: BC Managed Care – PPO | Admitting: Podiatry

## 2021-06-04 ENCOUNTER — Other Ambulatory Visit: Payer: Self-pay

## 2021-06-04 VITALS — BP 121/79 | HR 85 | Temp 97.5°F

## 2021-06-04 DIAGNOSIS — M21619 Bunion of unspecified foot: Secondary | ICD-10-CM

## 2021-06-04 DIAGNOSIS — Z9889 Other specified postprocedural states: Secondary | ICD-10-CM

## 2021-06-08 NOTE — Progress Notes (Signed)
Subjective: Theresa Rocha is a 55 y.o. is seen today in office s/p left 1st MPTJ arthrodesis preformed on 05/20/2021.  States that she still in some discomfort but overall is improving.  She has returned to work.  She is been walking in the cam boot.  She is denies any recent injury or trauma.  She has no other concerns today.  Denies any systemic complaints such as fevers, chills, nausea, vomiting. No calf pain, chest pain, shortness of breath.   Objective: General: No acute distress, AAOx3  DP/PT pulses palpable 2/4, CRT < 3 sec to all digits.  Protective sensation intact. Motor function intact.  Left foot: Incision is well coapted without any evidence of dehiscence with sutures intact. There is no surrounding erythema, ascending cellulitis, fluctuance, crepitus, malodor, drainage/purulence. There is mild to moderate edema around the surgical site. There is mild pain along the surgical site.  Arthrodesis site is stable.  Toe is rectus position. No other areas of tenderness to bilateral lower extremities.  No other open lesions or pre-ulcerative lesions.  No pain with calf compression, swelling, warmth, erythema.   Assessment and Plan:  Status post left foot first MPJ arthrodesis, doing well with no complications   -Treatment options discussed including all alternatives, risks, and complications -I cleaned the incisions today.  On the distal portion of the incision there is no opening but there is movement across the incision.  Keep dressing clean, dry, intact. -Continue cam boot. -Ice/elevation -Pain medication as needed. -Monitor for any clinical signs or symptoms of infection and DVT/PE and directed to call the office immediately should any occur or go to the ER. -Follow-up as scheduled or sooner if any problems arise. In the meantime, encouraged to call the office with any questions, concerns, change in symptoms.   Celesta Gentile, DPM

## 2021-06-09 ENCOUNTER — Other Ambulatory Visit: Payer: Self-pay | Admitting: Interventional Radiology

## 2021-06-09 DIAGNOSIS — C50919 Malignant neoplasm of unspecified site of unspecified female breast: Secondary | ICD-10-CM

## 2021-06-11 ENCOUNTER — Other Ambulatory Visit: Payer: Self-pay | Admitting: Podiatry

## 2021-06-11 NOTE — Telephone Encounter (Signed)
Please advise 

## 2021-06-16 ENCOUNTER — Other Ambulatory Visit: Payer: Self-pay | Admitting: Internal Medicine

## 2021-06-18 ENCOUNTER — Encounter: Payer: BC Managed Care – PPO | Admitting: Podiatry

## 2021-06-18 DIAGNOSIS — K920 Hematemesis: Secondary | ICD-10-CM | POA: Diagnosis not present

## 2021-06-18 DIAGNOSIS — R42 Dizziness and giddiness: Secondary | ICD-10-CM | POA: Diagnosis not present

## 2021-06-18 DIAGNOSIS — R519 Headache, unspecified: Secondary | ICD-10-CM | POA: Diagnosis not present

## 2021-06-19 ENCOUNTER — Other Ambulatory Visit: Payer: Self-pay

## 2021-06-19 ENCOUNTER — Encounter: Payer: Self-pay | Admitting: Podiatry

## 2021-06-19 ENCOUNTER — Ambulatory Visit (INDEPENDENT_AMBULATORY_CARE_PROVIDER_SITE_OTHER): Payer: BC Managed Care – PPO

## 2021-06-19 ENCOUNTER — Ambulatory Visit (INDEPENDENT_AMBULATORY_CARE_PROVIDER_SITE_OTHER): Payer: BC Managed Care – PPO | Admitting: Podiatry

## 2021-06-19 DIAGNOSIS — M21619 Bunion of unspecified foot: Secondary | ICD-10-CM

## 2021-06-19 DIAGNOSIS — Z9889 Other specified postprocedural states: Secondary | ICD-10-CM | POA: Diagnosis not present

## 2021-06-23 NOTE — Progress Notes (Signed)
Subjective: Theresa Rocha is a 55 y.o. is seen today in office s/p left 1st MPTJ arthrodesis preformed on 05/20/2021.  She says the surgical boot was hurting so she went back to wearing surgical shoe that she had at home.  This is been doing well she not had significant pain to her foot.  No increase in swelling.  No recent injury or changes otherwise. Denies any systemic complaints such as fevers, chills, nausea, vomiting. No calf pain, chest pain, shortness of breath.   Objective: General: No acute distress, AAOx3  DP/PT pulses palpable 2/4, CRT < 3 sec to all digits.  Protective sensation intact. Motor function intact.  Left foot: Incision is well coapted without any evidence of dehiscence and scar is formed.  There is minimal edema.  There is no erythema or warmth.  Arthrodesis site appears to be stable.  No significant tenderness palpation. No other areas of tenderness to bilateral lower extremities.  No other open lesions or pre-ulcerative lesions.  No pain with calf compression, swelling, warmth, erythema.   Assessment and Plan:  Status post left foot first MPJ arthrodesis, doing well with no complications   -Treatment options discussed including all alternatives, risks, and complications -X-rays obtained reviewed.  Radiolucency still present with first MPJ arthrodesis with hardware intact with any complicating factors. -Continue immobilization.  Not ready to transition to regular shoe at this time. -Continue to ice and elevate. -Pain medication as needed-she has not been requiring this. -Monitor for any clinical signs or symptoms of infection and DVT/PE and directed to call the office immediately should any occur or go to the ER. -Follow-up as scheduled or sooner if any problems arise. In the meantime, encouraged to call the office with any questions, concerns, change in symptoms.   Celesta Gentile, DPM

## 2021-07-02 ENCOUNTER — Ambulatory Visit: Payer: BC Managed Care – PPO

## 2021-07-02 ENCOUNTER — Other Ambulatory Visit: Payer: BC Managed Care – PPO

## 2021-07-02 ENCOUNTER — Ambulatory Visit: Payer: BC Managed Care – PPO | Admitting: Hematology

## 2021-07-06 ENCOUNTER — Encounter: Payer: BC Managed Care – PPO | Admitting: Podiatry

## 2021-07-07 ENCOUNTER — Other Ambulatory Visit: Payer: Self-pay

## 2021-07-07 ENCOUNTER — Ambulatory Visit (INDEPENDENT_AMBULATORY_CARE_PROVIDER_SITE_OTHER): Payer: BC Managed Care – PPO | Admitting: Podiatry

## 2021-07-07 ENCOUNTER — Encounter: Payer: Self-pay | Admitting: Podiatry

## 2021-07-07 ENCOUNTER — Ambulatory Visit (INDEPENDENT_AMBULATORY_CARE_PROVIDER_SITE_OTHER): Payer: BC Managed Care – PPO

## 2021-07-07 DIAGNOSIS — Z9889 Other specified postprocedural states: Secondary | ICD-10-CM | POA: Diagnosis not present

## 2021-07-07 DIAGNOSIS — M21619 Bunion of unspecified foot: Secondary | ICD-10-CM

## 2021-07-10 DIAGNOSIS — M21619 Bunion of unspecified foot: Secondary | ICD-10-CM | POA: Insufficient documentation

## 2021-07-10 NOTE — Progress Notes (Signed)
Subjective: Theresa Rocha is a 55 y.o. is seen today in office s/p left 1st MPTJ arthrodesis preformed on 05/20/2021.  States that she is doing better.  She is still wearing surgical shoe.  She has not been wrapping it she notices some swelling occasionally.  Not take any pain medication.  She has no new concerns today.  No recent injury or falls.  Denies any fevers or chills.     Objective: General: No acute distress, AAOx3  DP/PT pulses palpable 2/4, CRT < 3 sec to all digits.  Protective sensation intact. Motor function intact.  Left foot: Incision is well coapted without any evidence of dehiscence and scar is formed.  There is minimal edema.  There is no erythema or warmth.  Arthrodesis site appears to be stable.  There is no tenderness palpation on surgical site. No other areas of tenderness to bilateral lower extremities.  No other open lesions or pre-ulcerative lesions.  No pain with calf compression, swelling, warmth, erythema.   Assessment and Plan:  Status post left foot first MPJ arthrodesis, doing well with no complications   -Treatment options discussed including all alternatives, risks, and complications -X-rays obtained reviewed.  Increased consolidation across the arthrodesis site.  No evidence of acute fracture.  Hardware intact. -At this time she can transition to regular shoe as tolerated.  Continue with compression, elevation to help with any postoperative edema.  Return in about 4 weeks (around 08/04/2021).  Trula Slade DPM

## 2021-07-17 ENCOUNTER — Other Ambulatory Visit: Payer: Self-pay

## 2021-07-17 ENCOUNTER — Ambulatory Visit (HOSPITAL_COMMUNITY)
Admission: RE | Admit: 2021-07-17 | Discharge: 2021-07-17 | Disposition: A | Discharge status: Home / Self Care | Payer: BC Managed Care – PPO | Source: Ambulatory Visit | Attending: Hematology | Admitting: Hematology

## 2021-07-17 ENCOUNTER — Telehealth: Payer: Self-pay | Admitting: *Deleted

## 2021-07-17 DIAGNOSIS — K7460 Unspecified cirrhosis of liver: Secondary | ICD-10-CM | POA: Diagnosis not present

## 2021-07-17 DIAGNOSIS — K828 Other specified diseases of gallbladder: Secondary | ICD-10-CM | POA: Diagnosis not present

## 2021-07-17 DIAGNOSIS — C220 Liver cell carcinoma: Secondary | ICD-10-CM | POA: Diagnosis not present

## 2021-07-17 DIAGNOSIS — K7689 Other specified diseases of liver: Secondary | ICD-10-CM | POA: Diagnosis not present

## 2021-07-17 MED ORDER — GADOBUTROL 1 MMOL/ML IV SOLN
5.0000 mL | Freq: Once | INTRAVENOUS | Status: AC | PRN
  Administered 2021-07-17: 5 mL via INTRAVENOUS

## 2021-07-17 NOTE — Telephone Encounter (Signed)
Returned the call to patient, no answer, left message per Dr Jacqualyn Posey concerning MRI compatibility.

## 2021-07-17 NOTE — Telephone Encounter (Signed)
Patient is scheduled for an MRI (abdomen) today. She had a plate inserted into the foot 2 months ago, is this MRI compatible?Please advise.

## 2021-07-21 ENCOUNTER — Encounter: Payer: Self-pay | Admitting: *Deleted

## 2021-07-21 ENCOUNTER — Ambulatory Visit
Admission: RE | Admit: 2021-07-21 | Discharge: 2021-07-21 | Disposition: A | Discharge status: Home / Self Care | Payer: BC Managed Care – PPO | Source: Ambulatory Visit | Attending: Interventional Radiology | Admitting: Interventional Radiology

## 2021-07-21 DIAGNOSIS — C787 Secondary malignant neoplasm of liver and intrahepatic bile duct: Secondary | ICD-10-CM | POA: Diagnosis not present

## 2021-07-21 DIAGNOSIS — C50919 Malignant neoplasm of unspecified site of unspecified female breast: Secondary | ICD-10-CM

## 2021-07-21 HISTORY — PX: IR RADIOLOGIST EVAL & MGMT: IMG5224

## 2021-07-22 ENCOUNTER — Other Ambulatory Visit: Payer: Self-pay | Admitting: Interventional Radiology

## 2021-07-22 DIAGNOSIS — C220 Liver cell carcinoma: Secondary | ICD-10-CM

## 2021-08-07 ENCOUNTER — Ambulatory Visit (HOSPITAL_COMMUNITY)
Admission: RE | Admit: 2021-08-07 | Discharge: 2021-08-07 | Disposition: A | Discharge status: Home / Self Care | Payer: BC Managed Care – PPO | Source: Ambulatory Visit | Attending: Interventional Radiology | Admitting: Interventional Radiology

## 2021-08-07 ENCOUNTER — Other Ambulatory Visit: Payer: Self-pay

## 2021-08-07 ENCOUNTER — Other Ambulatory Visit (HOSPITAL_COMMUNITY): Payer: Self-pay | Admitting: Interventional Radiology

## 2021-08-07 DIAGNOSIS — K7689 Other specified diseases of liver: Secondary | ICD-10-CM | POA: Diagnosis not present

## 2021-08-07 DIAGNOSIS — C220 Liver cell carcinoma: Secondary | ICD-10-CM

## 2021-08-07 DIAGNOSIS — R188 Other ascites: Secondary | ICD-10-CM | POA: Diagnosis not present

## 2021-08-07 MED ORDER — SULFUR HEXAFLUORIDE MICROSPH 60.7-25 MG IJ SUSR
INTRAMUSCULAR | Status: AC
  Filled 2021-08-07: qty 5

## 2021-08-07 MED ORDER — SULFUR HEXAFLUORIDE MICROSPH 60.7-25 MG IJ SUSR
INTRAMUSCULAR | Status: DC | PRN
  Administered 2021-08-07: 2.4 mL via INTRAVENOUS

## 2021-08-07 MED ORDER — SULFUR HEXAFLUORIDE MICROSPH 60.7-25 MG IJ SUSR: 5.0000 mL | Freq: Once | INTRAMUSCULAR | Status: DC | PRN

## 2021-08-10 ENCOUNTER — Other Ambulatory Visit: Payer: Self-pay

## 2021-08-10 ENCOUNTER — Ambulatory Visit (INDEPENDENT_AMBULATORY_CARE_PROVIDER_SITE_OTHER): Payer: BC Managed Care – PPO

## 2021-08-10 ENCOUNTER — Encounter: Payer: Self-pay | Admitting: Podiatry

## 2021-08-10 ENCOUNTER — Ambulatory Visit (INDEPENDENT_AMBULATORY_CARE_PROVIDER_SITE_OTHER): Payer: BC Managed Care – PPO | Admitting: Podiatry

## 2021-08-10 DIAGNOSIS — M21612 Bunion of left foot: Secondary | ICD-10-CM | POA: Diagnosis not present

## 2021-08-10 DIAGNOSIS — M21619 Bunion of unspecified foot: Secondary | ICD-10-CM

## 2021-08-10 DIAGNOSIS — Z9889 Other specified postprocedural states: Secondary | ICD-10-CM

## 2021-08-10 NOTE — Progress Notes (Signed)
Chief Complaint: Patient was seen in consultation today for surveillance of liver lesions.  History of Present Illness: Theresa Rocha is a 55 y.o. female being followed for possible ablation/local therapy of liver lesions suspicious for small hepatocellular carcinomas.  She has a history of chronic hepatitis C infection and cirrhosis with MRI of the abdomen on 07/26/2019 demonstrating 2 small left lobe lesions measuring approximately 1.1 cm and 1.5 cm in greatest diameter and demonstrating increased arterial phase enhancement and washout pattern consistent with small hepatocellular carcinomas (LI-RADS LR-5).  Inferiorly in the right lobe there was a 0.7 cm area of arterial enhancement that was indeterminate (LR-3).  Neither of the 2 suspicious left lobe lesions could be localized by ultrasound in the liver. A follow-up MRI on 12/30/2020 demonstrated stable segment II lesion measuring 1.0 x 0.9 cm, stable segment II lesion measuring 1.3 x 1.2 cm and stable tiny focus of arterial hyperenhancement in the right lobe in segment VI measuring no more than 5 mm.  Lesions are characterized as LR 4 and LR 3.  Karime remains asymptomatic. A follow up MRI was performed on 07/17/2021.  Past Medical History:  Diagnosis Date   Breast cancer ([place]) 08/30/2019   Cancer ([place])    liver- just diagnosed, not started treatment yet   Cervicalgia    COPD (chronic obstructive pulmonary disease) (HCC)    GERD (gastroesophageal reflux disease)    Headache    Hepatic cirrhosis (HCC)    Hepatitis C    resolved 2018   Hepatocellular carcinoma (HCC)    IBS (irritable bowel syndrome)    Lactose intolerance    Personal history of radiation therapy 11/2019   Right breast   Portal hypertension ([place])    Raynaud's syndrome    Thrombocytopenia ([place])     Past Surgical History:  Procedure Laterality Date   BREAST BIOPSY Right 09/2019   BREAST LUMPECTOMY Right 09/2019   BREAST LUMPECTOMY WITH AXILLARY LYMPH NODE  BIOPSY Right 10/16/2019   Procedure: RIGHT BREAST LUMPECTOMY WITH SENTINEL LYMPH NODE BIOPSY;  Surgeon: Stark Klein, MD;  Location: [facility];  Service: General;  Laterality: Right;   COLONOSCOPY     HAMMER TOE SURGERY Left 2012   pins placed in great toe, 2nd,3rd, 4th toes, all pins removed except great toe   IR PARACENTESIS  11/13/2019   IR RADIOLOGIST EVAL & MGMT  08/16/2019   IR RADIOLOGIST EVAL & MGMT  02/06/2020   IR RADIOLOGIST EVAL & MGMT  06/26/2020   IR RADIOLOGIST EVAL & MGMT  12/31/2020   IR RADIOLOGIST EVAL & MGMT  07/21/2021   PELVIC LAPAROSCOPY Bilateral 1992   UPPER GASTROINTESTINAL ENDOSCOPY  2002   had esophageal dilitation    Allergies: Hydromorphone hcl and Aspirin  Medications: Prior to Admission medications   Medication Sig Start Date End Date Taking? Authorizing Provider  baclofen (LIORESAL) 10 MG tablet Take 10 mg by mouth 2 (two) times daily as needed. 10/31/19   [provider]  cephALEXin (KEFLEX) 500 MG capsule Take 1 capsule (500 mg total) by mouth 3 (three) times daily. 05/20/21   Trula Slade, DPM  dicyclomine (BENTYL) 20 MG tablet Take one tablet every 4-6 hours as needed for abdominal cramping 11/21/19   Irene Shipper, MD  KLOR-CON M20 20 MEQ tablet TAKE 1 TABLET BY MOUTH EVERY DAY 03/10/20   Irene Shipper, MD  loperamide (IMODIUM) 2 MG capsule Take 2 mg by mouth daily as needed for diarrhea or loose stools.  [provider]  ondansetron (ZOFRAN) 8 MG tablet Take 1 tablet (8 mg total) by mouth every 8 (eight) hours as needed for nausea or vomiting. Patient not taking: Reported on 01/03/2020 08/02/19   Alla Feeling, NP  pantoprazole (PROTONIX) 40 MG tablet Take 1 tablet (40 mg total) by mouth daily. Office visit for future refills 06/16/21   Irene Shipper, MD  promethazine (PHENERGAN) 25 MG tablet Take 1 tablet (25 mg total) by mouth every 8 (eight) hours as needed for nausea or vomiting. 05/25/21   Edrick Kins, DPM  rivaroxaban (XARELTO)  10 MG TABS tablet Take 1 tablet (10 mg total) by mouth daily. 05/20/21   Trula Slade, DPM  tamoxifen (NOLVADEX) 10 MG tablet Take by mouth.    [provider]  tamoxifen (NOLVADEX) 20 MG tablet TAKE 1 TABLET BY MOUTH EVERY DAY 10/13/20   Truitt Merle, MD     Family History  Problem Relation Age of Onset   Liver cancer Mother    Lung cancer Mother    Lung cancer Father    Liver cancer Maternal Grandmother    Colon cancer Neg Hx    Esophageal cancer Neg Hx    Stomach cancer Neg Hx    Rectal cancer Neg Hx     Social History   Socioeconomic History   Marital status: Married    Spouse name: Not on file   Number of children: Not on file   Years of education: Not on file   Highest education level: Not on file  Occupational History   Not on file  Tobacco Use   Smoking status: Every Day    Packs/day: 0.50    Years: 30.00    Pack years: 15.00    Types: Cigarettes    Start date: 11/29/1978   Smokeless tobacco: Never  Vaping Use   Vaping Use: Never used  Substance and Sexual Activity   Alcohol use: Not Currently    Alcohol/week: 6.0 standard drinks    Types: 6 Cans of beer per week    Comment: stopped 06/2019   Drug use: No   Sexual activity: Not Currently    Partners: Male  Other Topics Concern   Not on file  Social History Narrative   Not on file   Social Determinants of Health   Financial Resource Strain: Not on file  Food Insecurity: Not on file  Transportation Needs: Not on file  Physical Activity: Not on file  Stress: Not on file  Social Connections: Not on file    ECOG Status: 0 - Asymptomatic  Review of Systems: A 12 point ROS discussed and pertinent positives are indicated in the HPI above.  All other systems are negative.  Review of Systems  Vital Signs: LMP 04/20/2013   Physical Exam  Mallampati Score:     Imaging: MR Abdomen W Wo Contrast  Result Date: 07/20/2021 CLINICAL DATA:  Hepatocellular carcinoma, liver lesion surveillance  EXAM: MRI ABDOMEN WITHOUT AND WITH CONTRAST TECHNIQUE: Multiplanar multisequence MR imaging of the abdomen was performed both before and after the administration of intravenous contrast. CONTRAST:  69m GADAVIST GADOBUTROL 1 MMOL/ML IV SOLN COMPARISON:  12/30/2020 FINDINGS: Lower chest: No acute findings. Hepatobiliary: Coarse, nodular, cirrhotic morphology of the liver. Unchanged arterially hyperenhancing lesion of the anterior left lobe of the liver, hepatic segment II, measuring 1.1 x 0.9 cm (series 801, image 39). No evidence of washout or capsule. Redemonstrated arterially hyperenhancing lesion with mild underlying intrinsic T1 hyperintensity, inferior  and lateral to lesion above, hepatic segment II, measuring 1.2 x 1.2 cm (series 801, image 51). This lesion demonstrates subtle evidence of washout, new compared to prior examination, without evidence of capsule (series 804, image 51). Unchanged subcentimeter focus of arterial hyperenhancement of the inferior right lobe of the liver, hepatic segment VI, measuring 5 mm (series 801, image 90). There is a new, or at least substantially enlarged arterially hyperenhancing lesion of the posterior liver dome, hepatic segment VII, measuring 2.6 x 2.5 cm, without evidence of washout or capsule (series 801, image 50). New subcentimeter arterially hyperenhancing lesion of the inferior right lobe of the liver, hepatic segment VI, measuring 4 mm (series 801, image 78). Mildly distended gallbladder with gallbladder wall thickening and pericholecystic fluid (series 4, image 20). Pancreas: No mass, inflammatory changes, or other parenchymal abnormality identified. Spleen:  Within normal limits in size and appearance. Adrenals/Urinary Tract: No masses identified. No evidence of hydronephrosis. Stomach/Bowel: Visualized portions within the abdomen are unremarkable. Vascular/Lymphatic: No pathologically enlarged lymph nodes identified. No abdominal aortic aneurysm demonstrated.  Other:  Small volume ascites throughout the abdomen and pelvis. Musculoskeletal: No suspicious bone lesions identified. IMPRESSION: 1. Redemonstrated 1.2 x 1.2 cm arterially hyperenhancing lesion of the left lobe of the liver, hepatic segment II. This demonstrates subtle evidence of washout, without evidence of capsule. New evidence of washout upgrades this lesion to LI-RADS category 5. 2. There is a new, or at least substantially enlarged arterially hyperenhancing lesion of the posterior liver dome, hepatic segment VII, measuring 2.6 x 2.5 cm, without evidence of washout or capsule. LI-RADS category 5. 3. Unchanged arterially hyperenhancing lesion of the anterior left lobe of the liver, hepatic segment II, measuring 1.1 x 0.9 cm. No evidence of washout or capsule. LI-RADS category 3. 4. Additional subcentimeter foci of arterial hyperenhancement. LI-RADS category 3. 5. Small volume ascites throughout the abdomen and pelvis. 6. Mildly distended gallbladder with gallbladder wall thickening and pericholecystic fluid, nonspecific in the setting of ascites. No biliary ductal dilatation. Electronically Signed   By: Eddie Candle M.D.   On: 07/20/2021 09:09   US Abdomen Limited  Result Date: 08/07/2021 CLINICAL DATA:  History of cirrhosis. Suspicious lesion in the left lobe of the liver measuring approximately 1.2 cm by MRI and demonstrating arterial enhancement and washout. Recent MRI also demonstrated new area of arterial hyperenhancement in the posterior right lobe of the liver measuring approximately 2.5 cm without washout. Further evaluation of both of these areas is performed with contrast enhanced ultrasound to help with further management. EXAM: 1. LIMITED ABDOMINAL ULTRASOUND 2. ULTRASOUND OF THE LIVER WITH CONTRAST 3. ULTRASOUND OF THE LIVER WITH CONTRAST, ADDITIONAL LESION AND INJECTION CONTRAST:  Lumason 4.8 mL IV Number of injections: 2 COMPARISON:  MRI of the abdomen on 07/17/2021 FINDINGS: LIVER Size:  Enlarged left lobe and atrophic right lobe. Echogenicity: Increased/heterogeneous. Surface Contour: Nodular. Observation 1 Lobe: Right Size: Ill-defined hypoechoic area of by ultrasound in the posterior right lobe measuring roughly 2.5-2.7 cm. Arterial phase enhancement features: No arterial phase enhancement identified. Onset of washout: No washout identified. Degree of washout: No os showed identified. Tumor in vein: None CEUS LI-RADS category: CEUS LR-2 Observation 2 Lobe: Left Size: 1.7 x 1.8 x 1.6 cm Arterial phase enhancement features: Diffuse Onset of washout: 12 seconds Degree of washout: Marked Tumor in vein: No CEUS LI-RADS category: CEUS LR-5 FLUID Small amount of ascites seen adjacent to the left lobe of the liver. IMPRESSION: 1. Posterior right lobe hepatic lesion seen by  MRI is identified as and ill-defined area of relative decreased echogenicity in the liver by ultrasound measuring 2.5-2.7 cm. This demonstrates no arterial enhancement or washout is considered CEUS LR-2 by contrast enhanced ultrasound (probably benign). 2. Segment II left lobe rounded lesion is hypoechoic by ultrasound and measures approximately 1.7 x 1.8 x 1.6 cm. This demonstrates diffuse arterial enhancement with diffuse washout and is considered CEUS LR-5 by contrast enhanced ultrasound (definitely [place]). Electronically Signed   By: Aletta Edouard M.D.   On: 08/07/2021 17:45   US LIVER W/CM 1ST LESION  Result Date: 08/07/2021 CLINICAL DATA:  History of cirrhosis. Suspicious lesion in the left lobe of the liver measuring approximately 1.2 cm by MRI and demonstrating arterial enhancement and washout. Recent MRI also demonstrated new area of arterial hyperenhancement in the posterior right lobe of the liver measuring approximately 2.5 cm without washout. Further evaluation of both of these areas is performed with contrast enhanced ultrasound to help with further management. EXAM: 1. LIMITED ABDOMINAL ULTRASOUND 2. ULTRASOUND OF THE  LIVER WITH CONTRAST 3. ULTRASOUND OF THE LIVER WITH CONTRAST, ADDITIONAL LESION AND INJECTION CONTRAST:  Lumason 4.8 mL IV Number of injections: 2 COMPARISON:  MRI of the abdomen on 07/17/2021 FINDINGS: LIVER Size: Enlarged left lobe and atrophic right lobe. Echogenicity: Increased/heterogeneous. Surface Contour: Nodular. Observation 1 Lobe: Right Size: Ill-defined hypoechoic area of by ultrasound in the posterior right lobe measuring roughly 2.5-2.7 cm. Arterial phase enhancement features: No arterial phase enhancement identified. Onset of washout: No washout identified. Degree of washout: No os showed identified. Tumor in vein: None CEUS LI-RADS category: CEUS LR-2 Observation 2 Lobe: Left Size: 1.7 x 1.8 x 1.6 cm Arterial phase enhancement features: Diffuse Onset of washout: 12 seconds Degree of washout: Marked Tumor in vein: No CEUS LI-RADS category: CEUS LR-5 FLUID Small amount of ascites seen adjacent to the left lobe of the liver. IMPRESSION: 1. Posterior right lobe hepatic lesion seen by MRI is identified as and ill-defined area of relative decreased echogenicity in the liver by ultrasound measuring 2.5-2.7 cm. This demonstrates no arterial enhancement or washout is considered CEUS LR-2 by contrast enhanced ultrasound (probably benign). 2. Segment II left lobe rounded lesion is hypoechoic by ultrasound and measures approximately 1.7 x 1.8 x 1.6 cm. This demonstrates diffuse arterial enhancement with diffuse washout and is considered CEUS LR-5 by contrast enhanced ultrasound (definitely [place]). Electronically Signed   By: Aletta Edouard M.D.   On: 08/07/2021 17:45   US LIVER W/CM EA ADDT'L LESION  Result Date: 08/07/2021 CLINICAL DATA:  History of cirrhosis. Suspicious lesion in the left lobe of the liver measuring approximately 1.2 cm by MRI and demonstrating arterial enhancement and washout. Recent MRI also demonstrated new area of arterial hyperenhancement in the posterior right lobe of the liver  measuring approximately 2.5 cm without washout. Further evaluation of both of these areas is performed with contrast enhanced ultrasound to help with further management. EXAM: 1. LIMITED ABDOMINAL ULTRASOUND 2. ULTRASOUND OF THE LIVER WITH CONTRAST 3. ULTRASOUND OF THE LIVER WITH CONTRAST, ADDITIONAL LESION AND INJECTION CONTRAST:  Lumason 4.8 mL IV Number of injections: 2 COMPARISON:  MRI of the abdomen on 07/17/2021 FINDINGS: LIVER Size: Enlarged left lobe and atrophic right lobe. Echogenicity: Increased/heterogeneous. Surface Contour: Nodular. Observation 1 Lobe: Right Size: Ill-defined hypoechoic area of by ultrasound in the posterior right lobe measuring roughly 2.5-2.7 cm. Arterial phase enhancement features: No arterial phase enhancement identified. Onset of washout: No washout identified. Degree of washout: No os showed  identified. Tumor in vein: None CEUS LI-RADS category: CEUS LR-2 Observation 2 Lobe: Left Size: 1.7 x 1.8 x 1.6 cm Arterial phase enhancement features: Diffuse Onset of washout: 12 seconds Degree of washout: Marked Tumor in vein: No CEUS LI-RADS category: CEUS LR-5 FLUID Small amount of ascites seen adjacent to the left lobe of the liver. IMPRESSION: 1. Posterior right lobe hepatic lesion seen by MRI is identified as and ill-defined area of relative decreased echogenicity in the liver by ultrasound measuring 2.5-2.7 cm. This demonstrates no arterial enhancement or washout is considered CEUS LR-2 by contrast enhanced ultrasound (probably benign). 2. Segment II left lobe rounded lesion is hypoechoic by ultrasound and measures approximately 1.7 x 1.8 x 1.6 cm. This demonstrates diffuse arterial enhancement with diffuse washout and is considered CEUS LR-5 by contrast enhanced ultrasound (definitely [place]). Electronically Signed   By: Aletta Edouard M.D.   On: 08/07/2021 17:45   IR Radiologist Eval & Mgmt  Result Date: 07/21/2021 Please refer to notes tab for details about interventional  procedure. (Op Note)   Labs:  CBC: Recent Labs    10/02/20 0954 01/02/21 0910 01/02/21 1047 05/01/21 1113  WBC 5.2 4.8  --  5.6  HGB 14.5 9.6*  --  11.0*  HCT 43.9 30.4* 27.6* 35.6*  PLT 78* 77*  --  68*    COAGS: No results for input(s): INR, APTT in the last 8760 hours.  BMP: Recent Labs    10/02/20 0954 01/02/21 0910 05/01/21 1113  NA 141 140 139  K 3.9 3.7 3.7  CL 106 112* 104  CO2 '28 22 27  '$ GLUCOSE 100* 102* 107*  BUN 6 4* 4*  CALCIUM 8.5* 8.0* 8.6*  CREATININE 0.77 0.72 0.76  GFRNONAA >60 >60 >60    LIVER FUNCTION TESTS: Recent Labs    10/02/20 0954 01/02/21 0910 05/01/21 1113  BILITOT 0.9 0.5 1.3*  AST 60* 43* 76*  ALT '28 23 31  '$ ALKPHOS 89 77 82  PROT 7.8 6.7 7.5  ALBUMIN 3.8 3.4* 3.5    TUMOR MARKERS: Recent Labs    12/16/20 1106  AFPTM 4.4    Assessment and Plan:  I reviewed the recent MRI of 8/22 with Otila Kluver. A small LR-3 lesion in the left lobe is unchanged. The other left lobe lesion in segment II appears slightly larger, measuring 1.2 cm and demonstrates washout and was upgraded to LR-5. An area of vague hyperenhancement in the posterior right lobe appeared larger, measuring 2.6 cm and was categorized as LR-5 based on growth, despite demonstrating no washout or enhancing capsule by MRI.  In order to characterize both of the LR-5 lesions by MRI better for treatment planning recommendations, a contrast enhanced ultrasound (CEUS) was performed on 08/07/21 with 2 different Lumason injections centered over the two areas. The vague posterior right lobe lesion also appears vague by [place] and corresponds in size, measuring roughly 2.7 cm. CEUS shows no arterial hyperenhancement or washout and the lesion is considered CEUS LR-2. The segment II lesion in the left lobe is well circumscribed by [place] and measures approximately 1.7 x 1.8 x 1.6 cm. This lesion demonstrates uniform arterial enhancement and quick washout and is considered CEUS LR-5.  I discussed  CEUS findings with Jeanny and discussed percutaneous thermal ablation, biopsy and continued surveillance. The right lobe lesion can clearly be followed by MRI given more reassuring appearance by CEUS. The left lobe lesion is clearly highly suspicious for [facility], and after discussion, Charlesia would rather proceed with ablation  at this time. It is in a good location and of size amenable to percutaneous microwave ablation. We will begin the scheduling process for ablation at [facility] under [place] and CT guidance under general anesthesia.   Electronically Signed: Azzie Roup 08/10/2021, 8:25 AM    I spent a total of  25 Minutes in face to face in clinical consultation, greater than 50% of which was counseling/coordinating care for liver lesions.

## 2021-08-12 ENCOUNTER — Other Ambulatory Visit (HOSPITAL_COMMUNITY): Payer: Self-pay | Admitting: Interventional Radiology

## 2021-08-12 DIAGNOSIS — K769 Liver disease, unspecified: Secondary | ICD-10-CM

## 2021-08-12 NOTE — Progress Notes (Signed)
Subjective: Theresa Rocha is a 55 y.o. is seen today in office s/p left 1st MPTJ arthrodesis preformed on 05/20/2021.  She does report that she is doing better.  She does work 12-hour shifts on Saturday and Sunday over the weekend when her regular shoe.  Since doing this she noticed some mild ankle swelling on the left side but no injury to the area.  She does state she has some ascites right now not sure if it is coming from that issue.  She does feel that she is making progress.  No other concerns today.   Objective: General: No acute distress, AAOx3  DP/PT pulses palpable 2/4, CRT < 3 sec to all digits.  Protective sensation intact. Motor function intact.  Left foot: Incision is well coapted without any evidence of dehiscence and scar is formed.  There is faint edema to the surgical site.  There is no erythema or warmth.  Arthrodesis site appears to be stable.  There is no tenderness palpation on surgical site.  There is mild edema present on the medial aspect of the ankle and the course of the flexor tendons there is no significant pain.  There is no erythema or warmth.  No other areas of point tenderness.  MMT 5/5. No other areas of tenderness to bilateral lower extremities.  No other open lesions or pre-ulcerative lesions.  No pain with calf compression, swelling, warmth, erythema.   Assessment and Plan:  Status post left foot first MPJ arthrodesis, doing well with no complications   -Treatment options discussed including all alternatives, risks, and complications -X-rays obtained reviewed.  Increased consolidation across the arthrodesis site off on the oblique view still radiolucent evident.  No evidence of acute fracture.  Hardware intact. -At this point she clinically is making good progress and having significant pain or swelling to the arthrodesis site.  We discussed transition to regular shoe as tolerated but we discussed wearing different shoes with more support as opposed to what she  is wearing.  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  Over this will improve and we discussed general rehab exercises to be performed.  Compression anklet as needed.  Trula Slade DPM

## 2021-08-26 NOTE — Progress Notes (Signed)
Sent message, via epic in basket, requesting orders in epic from surgeon.  

## 2021-08-31 NOTE — Patient Instructions (Signed)
DUE TO COVID-19 ONLY ONE VISITOR IS ALLOWED TO COME WITH YOU AND STAY IN THE WAITING ROOM ONLY DURING PRE OP AND PROCEDURE.   **NO VISITORS ARE ALLOWED IN THE SHORT STAY AREA OR RECOVERY ROOM!!**  IF YOU WILL BE ADMITTED INTO THE HOSPITAL YOU ARE ALLOWED ONLY TWO SUPPORT PEOPLE DURING VISITATION HOURS ONLY (10AM -8PM)   The support person(s) may change daily. The support person(s) must pass our screening, gel in and out, and wear a mask at all times, including in the patient's room. Patients must also wear a mask when staff or their support person are in the room.  No visitors under the age of 29. Any visitor under the age of 8 must be accompanied by an adult.    COVID SWAB TESTING MUST BE COMPLETED ON:  09/14/21 **MUST PRESENT COMPLETED FORM AT TESTING SITE**    [place]  [place] (backside of the building) You are not required to quarantine, however you are required to wear a well-fitted mask when you are out and around people not in your household.  Hand Hygiene often Do NOT share personal items Notify your provider if you are in close contact with someone who has COVID or you develop fever 100.4 or greater, new onset of sneezing, cough, sore throat, shortness of breath or body aches.  [place], [address], must go inside of the hospital, NOT A DRIVE THRU!  (Must self quarantine after testing. Follow instructions on handout.)       Your procedure is scheduled on:    Report to [facility] Main Entrance    Report to admitting at: 7:00 AM   Call this number if you have problems the morning of surgery [phone]   Do not eat food :After Midnight.   May have liquids until : 5:30 AM   day of surgery  CLEAR LIQUID DIET  Foods Allowed                                                                     Foods Excluded  Water, Black Coffee and tea, regular and decaf                              liquids that you cannot  Plain Jell-O in any flavor  (No red)                                           see through such as: Fruit ices (not with fruit pulp)                                     milk, soups, orange juice              Iced Popsicles (No red)  All solid food                                   Apple juices Sports drinks like Gatorade (No red) Lightly seasoned clear broth or consume(fat free) Sugar,   Sample Menu Breakfast                                Lunch                                     Supper Cranberry juice                    Beef broth                            Chicken broth Jell-O                                     Grape juice                           Apple juice Coffee or tea                        Jell-O                                      Popsicle                                                Coffee or tea                        Coffee or tea     Oral Hygiene is also important to reduce your risk of infection.                                    Remember - BRUSH YOUR TEETH THE MORNING OF SURGERY WITH YOUR REGULAR TOOTHPASTE   Do NOT smoke after Midnight   Take these medicines the morning of surgery with A SIP OF WATER:   DO NOT TAKE ANY ORAL DIABETIC MEDICATIONS DAY OF YOUR SURGERY                              You may not have any metal on your body including hair pins, jewelry, and body piercing             Do not wear make-up, lotions, powders, perfumes/cologne, or deodorant  Do not wear nail polish including gel and S&S, artificial/acrylic nails, or any other type of covering on natural nails including finger and toenails. If you have artificial nails, gel coating, etc. that needs to be removed by a nail salon please have this removed prior to surgery or  surgery may need to be canceled/ delayed if the surgeon/ anesthesia feels like they are unable to be safely monitored.   Do not shave  48 hours prior to surgery.     Do not bring valuables to the hospital. [place].   Contacts, dentures or bridgework may not be worn into surgery.   Bring small overnight bag day of surgery.    Patients discharged on the day of surgery will not be allowed to drive home.   Special Instructions: Bring a copy of your healthcare power of attorney and living will documents         the day of surgery if you haven't scanned them before.              Please read over the following fact sheets you were given: IF YOU HAVE QUESTIONS ABOUT YOUR PRE-OP INSTRUCTIONS PLEASE CALL [phone]   [facility] - Preparing for Surgery Before surgery, you can play an important role.  Because skin is not sterile, your skin needs to be as free of germs as possible.  You can reduce the number of germs on your skin by washing with CHG (chlorahexidine gluconate) soap before surgery.  CHG is an antiseptic cleaner which kills germs and bonds with the skin to continue killing germs even after washing. Please DO NOT use if you have an allergy to CHG or antibacterial soaps.  If your skin becomes reddened/irritated stop using the CHG and inform your nurse when you arrive at Short Stay. Do not shave (including legs and underarms) for at least 48 hours prior to the first CHG shower.  You may shave your face/neck. Please follow these instructions carefully:  1.  Shower with CHG Soap the night before surgery and the  morning of Surgery.  2.  If you choose to wash your hair, wash your hair first as usual with your  normal  shampoo.  3.  After you shampoo, rinse your hair and body thoroughly to remove the  shampoo.                           4.  Use CHG as you would any other liquid soap.  You can apply chg directly  to the skin and wash                       Gently with a scrungie or clean washcloth.  5.  Apply the CHG Soap to your body ONLY FROM THE NECK DOWN.   Do not use on face/ open                            Wound or open sores. Avoid contact with eyes, ears mouth and genitals (private parts).                       Wash face,  Genitals (private parts) with your normal soap.             6.  Wash thoroughly, paying special attention to the area where your surgery  will be performed.  7.  Thoroughly rinse your body with warm water from the neck down.  8.  DO NOT shower/wash with your normal soap after using and rinsing off  the CHG Soap.  9.  Pat yourself dry with a clean towel.            10.  Wear clean pajamas.            11.  Place clean sheets on your bed the night of your first shower and do not  sleep with pets. Day of Surgery : Do not apply any lotions/deodorants the morning of surgery.  Please wear clean clothes to the hospital/surgery center.  FAILURE TO FOLLOW THESE INSTRUCTIONS MAY RESULT IN THE CANCELLATION OF YOUR SURGERY PATIENT SIGNATURE_________________________________  NURSE SIGNATURE__________________________________  ________________________________________________________________________

## 2021-09-01 ENCOUNTER — Other Ambulatory Visit: Payer: Self-pay

## 2021-09-01 ENCOUNTER — Encounter (HOSPITAL_COMMUNITY)
Admission: RE | Admit: 2021-09-01 | Discharge: 2021-09-01 | Disposition: A | Discharge status: Home / Self Care | Payer: BC Managed Care – PPO | Source: Ambulatory Visit | Attending: Interventional Radiology | Admitting: Interventional Radiology

## 2021-09-01 ENCOUNTER — Encounter (HOSPITAL_COMMUNITY): Payer: Self-pay

## 2021-09-01 DIAGNOSIS — Z01818 Encounter for other preprocedural examination: Secondary | ICD-10-CM | POA: Diagnosis not present

## 2021-09-01 HISTORY — DX: Anemia, unspecified: D64.9

## 2021-09-01 LAB — CBC
HCT: 30.8 % — ABNORMAL LOW (ref 36.0–46.0)
Hemoglobin: 8.7 g/dL — ABNORMAL LOW (ref 12.0–15.0)
MCH: 22.4 pg — ABNORMAL LOW (ref 26.0–34.0)
MCHC: 28.2 g/dL — ABNORMAL LOW (ref 30.0–36.0)
MCV: 79.2 fL — ABNORMAL LOW (ref 80.0–100.0)
Platelets: 72 10*3/uL — ABNORMAL LOW (ref 150–400)
RBC: 3.89 MIL/uL (ref 3.87–5.11)
RDW: 22.8 % — ABNORMAL HIGH (ref 11.5–15.5)
WBC: 3.9 10*3/uL — ABNORMAL LOW (ref 4.0–10.5)
nRBC: 0 % (ref 0.0–0.2)

## 2021-09-01 LAB — BASIC METABOLIC PANEL
Anion gap: 7 (ref 5–15)
BUN: 6 mg/dL (ref 6–20)
CO2: 25 mmol/L (ref 22–32)
Calcium: 8.3 mg/dL — ABNORMAL LOW (ref 8.9–10.3)
Chloride: 111 mmol/L (ref 98–111)
Creatinine, Ser: 0.58 mg/dL (ref 0.44–1.00)
GFR, Estimated: >60 mL/min (ref >60)
Glucose, Bld: 122 mg/dL — ABNORMAL HIGH (ref 70–99)
Potassium: 3.9 mmol/L (ref 3.5–5.1)
Sodium: 143 mmol/L (ref 135–145)

## 2021-09-01 NOTE — Progress Notes (Signed)
COVID Vaccine Completed: Yes Date COVID Vaccine completed: 2021 x 1 COVID vaccine manufacturer: [employer]   PCP - Dr. Seward Carol Cardiologist -   Chest x-ray -  EKG -  Stress Test -  ECHO -  Cardiac Cath -  Pacemaker/ICD device last checked:  Sleep Study -  CPAP -   Fasting Blood Sugar -  Checks Blood Sugar _____ times a day  Blood Thinner Instructions: Aspirin Instructions: Last Dose:  Anesthesia review: Hx: COPD,Portal HTN,Smoker  Patient denies shortness of breath, fever, cough and chest pain at PAT appointment   Patient verbalized understanding of instructions that were given to them at the PAT appointment. Patient was also instructed that they will need to review over the PAT instructions again at home before surgery.

## 2021-09-01 NOTE — Progress Notes (Signed)
Lab results: HGB: 8.7 HTC: 30.2 Platelets: 72

## 2021-09-08 ENCOUNTER — Other Ambulatory Visit: Payer: Self-pay | Admitting: Internal Medicine

## 2021-09-14 ENCOUNTER — Other Ambulatory Visit (HOSPITAL_COMMUNITY): Payer: Self-pay | Admitting: Interventional Radiology

## 2021-09-15 ENCOUNTER — Other Ambulatory Visit: Payer: Self-pay | Admitting: Radiology

## 2021-09-15 ENCOUNTER — Other Ambulatory Visit (HOSPITAL_COMMUNITY): Payer: Self-pay | Admitting: Physician Assistant

## 2021-09-15 LAB — SARS CORONAVIRUS 2 (TAT 6-24 HRS): SARS Coronavirus 2: NEGATIVE

## 2021-09-15 NOTE — Anesthesia Preprocedure Evaluation (Addendum)
Anesthesia Evaluation  Patient identified by MRN, date of birth, ID band Patient awake    Airway Mallampati: II  TM Distance: >3 FB     Dental   Pulmonary COPD, Current Smoker and Patient abstained from smoking.,    breath sounds clear to auscultation       Cardiovascular negative cardio ROS   Rhythm:Regular Rate:Normal     Neuro/Psych Seizures -,     GI/Hepatic GERD  ,(+) Hepatitis -  Endo/Other  negative endocrine ROS  Renal/GU negative Renal ROS     Musculoskeletal   Abdominal   Peds  Hematology   Anesthesia Other Findings   Reproductive/Obstetrics                           Anesthesia Physical Anesthesia Plan  ASA: 3  Anesthesia Plan: General   Post-op Pain Management:    Induction: Intravenous  PONV Risk Score and Plan: 2 and Treatment may vary due to age or medical condition, Ondansetron, Midazolam and Dexamethasone  Airway Management Planned:   Additional Equipment:   Intra-op Plan:   Post-operative Plan: Extubation in OR  Informed Consent: I have reviewed the patients History and Physical, chart, labs and discussed the procedure including the risks, benefits and alternatives for the proposed anesthesia with the patient or authorized representative who has indicated his/her understanding and acceptance.     Dental advisory given  Plan Discussed with: Anesthesiologist and CRNA  Anesthesia Plan Comments:       Anesthesia Quick Evaluation

## 2021-09-16 ENCOUNTER — Encounter (HOSPITAL_COMMUNITY): Payer: Self-pay

## 2021-09-16 ENCOUNTER — Ambulatory Visit (HOSPITAL_COMMUNITY)
Admission: RE | Admit: 2021-09-16 | Discharge: 2021-09-16 | Disposition: A | Discharge status: Home / Self Care | Payer: BC Managed Care – PPO | Source: Ambulatory Visit | Attending: Interventional Radiology | Admitting: Interventional Radiology

## 2021-09-16 ENCOUNTER — Other Ambulatory Visit: Payer: Self-pay

## 2021-09-16 ENCOUNTER — Encounter (HOSPITAL_COMMUNITY)
Admission: RE | Disposition: A | Discharge status: Home / Self Care | Payer: Self-pay | Source: Ambulatory Visit | Attending: Interventional Radiology

## 2021-09-16 ENCOUNTER — Observation Stay (HOSPITAL_COMMUNITY)
Admission: RE | Admit: 2021-09-16 | Discharge: 2021-09-16 | Disposition: A | Discharge status: Home / Self Care | Payer: BC Managed Care – PPO | Source: Ambulatory Visit | Attending: Interventional Radiology | Admitting: Interventional Radiology

## 2021-09-16 ENCOUNTER — Ambulatory Visit (HOSPITAL_COMMUNITY): Payer: BC Managed Care – PPO | Admitting: Anesthesiology

## 2021-09-16 ENCOUNTER — Encounter (HOSPITAL_COMMUNITY): Payer: Self-pay | Admitting: Interventional Radiology

## 2021-09-16 VITALS — BP 112/82 | HR 73 | Temp 98.4°F | Ht 63.0 in | Wt 110.0 lb

## 2021-09-16 DIAGNOSIS — K7460 Unspecified cirrhosis of liver: Secondary | ICD-10-CM | POA: Diagnosis not present

## 2021-09-16 DIAGNOSIS — Z886 Allergy status to analgesic agent status: Secondary | ICD-10-CM | POA: Diagnosis not present

## 2021-09-16 DIAGNOSIS — K7031 Alcoholic cirrhosis of liver with ascites: Secondary | ICD-10-CM | POA: Diagnosis not present

## 2021-09-16 DIAGNOSIS — B182 Chronic viral hepatitis C: Secondary | ICD-10-CM | POA: Insufficient documentation

## 2021-09-16 DIAGNOSIS — K769 Liver disease, unspecified: Secondary | ICD-10-CM | POA: Diagnosis not present

## 2021-09-16 DIAGNOSIS — C220 Liver cell carcinoma: Secondary | ICD-10-CM | POA: Diagnosis not present

## 2021-09-16 DIAGNOSIS — F17210 Nicotine dependence, cigarettes, uncomplicated: Secondary | ICD-10-CM | POA: Diagnosis not present

## 2021-09-16 DIAGNOSIS — F17200 Nicotine dependence, unspecified, uncomplicated: Secondary | ICD-10-CM | POA: Insufficient documentation

## 2021-09-16 DIAGNOSIS — K219 Gastro-esophageal reflux disease without esophagitis: Secondary | ICD-10-CM | POA: Diagnosis not present

## 2021-09-16 DIAGNOSIS — J449 Chronic obstructive pulmonary disease, unspecified: Secondary | ICD-10-CM | POA: Insufficient documentation

## 2021-09-16 DIAGNOSIS — I8510 Secondary esophageal varices without bleeding: Secondary | ICD-10-CM | POA: Diagnosis not present

## 2021-09-16 DIAGNOSIS — Z79899 Other long term (current) drug therapy: Secondary | ICD-10-CM | POA: Diagnosis not present

## 2021-09-16 HISTORY — PX: RADIOLOGY WITH ANESTHESIA: SHX6223

## 2021-09-16 LAB — COMPREHENSIVE METABOLIC PANEL
ALT: 36 U/L (ref 0–44)
AST: 79 U/L — ABNORMAL HIGH (ref 15–41)
Albumin: 3.1 g/dL — ABNORMAL LOW (ref 3.5–5.0)
Alkaline Phosphatase: 87 U/L (ref 38–126)
Anion gap: 5 (ref 5–15)
BUN: <5 mg/dL — ABNORMAL LOW (ref 6–20)
CO2: 26 mmol/L (ref 22–32)
Calcium: 8.1 mg/dL — ABNORMAL LOW (ref 8.9–10.3)
Chloride: 108 mmol/L (ref 98–111)
Creatinine, Ser: 0.51 mg/dL (ref 0.44–1.00)
GFR, Estimated: >60 mL/min (ref >60)
Glucose, Bld: 97 mg/dL (ref 70–99)
Potassium: 3.6 mmol/L (ref 3.5–5.1)
Sodium: 139 mmol/L (ref 135–145)
Total Bilirubin: 1.1 mg/dL (ref 0.3–1.2)
Total Protein: 6.9 g/dL (ref 6.5–8.1)

## 2021-09-16 LAB — PROTIME-INR
INR: 1.6 — ABNORMAL HIGH (ref 0.8–1.2)
Prothrombin Time: 19.2 seconds — ABNORMAL HIGH (ref 11.4–15.2)

## 2021-09-16 LAB — CBC WITH DIFFERENTIAL/PLATELET
Abs Immature Granulocytes: 0.01 10*3/uL (ref 0.00–0.07)
Basophils Absolute: 0 10*3/uL (ref 0.0–0.1)
Basophils Relative: 1 %
Eosinophils Absolute: 0.1 10*3/uL (ref 0.0–0.5)
Eosinophils Relative: 4 %
HCT: 30.2 % — ABNORMAL LOW (ref 36.0–46.0)
Hemoglobin: 8.8 g/dL — ABNORMAL LOW (ref 12.0–15.0)
Immature Granulocytes: 0 %
Lymphocytes Relative: 24 %
Lymphs Abs: 0.9 10*3/uL (ref 0.7–4.0)
MCH: 22.4 pg — ABNORMAL LOW (ref 26.0–34.0)
MCHC: 29.1 g/dL — ABNORMAL LOW (ref 30.0–36.0)
MCV: 77 fL — ABNORMAL LOW (ref 80.0–100.0)
Monocytes Absolute: 0.4 10*3/uL (ref 0.1–1.0)
Monocytes Relative: 11 %
Neutro Abs: 2.2 10*3/uL (ref 1.7–7.7)
Neutrophils Relative %: 60 %
Platelets: 55 10*3/uL — ABNORMAL LOW (ref 150–400)
RBC: 3.92 MIL/uL (ref 3.87–5.11)
RDW: 23.7 % — ABNORMAL HIGH (ref 11.5–15.5)
WBC: 3.7 10*3/uL — ABNORMAL LOW (ref 4.0–10.5)
nRBC: 0 % (ref 0.0–0.2)

## 2021-09-16 LAB — TYPE AND SCREEN
ABO/RH(D): A POS
Antibody Screen: NEGATIVE

## 2021-09-16 SURGERY — CT WITH ANESTHESIA
Anesthesia: General

## 2021-09-16 MED ORDER — SODIUM CHLORIDE 0.9 % IV SOLN: INTRAVENOUS | Status: DC

## 2021-09-16 MED ORDER — LACTATED RINGERS IV SOLN: INTRAVENOUS | Status: DC

## 2021-09-16 MED ORDER — SUGAMMADEX SODIUM 200 MG/2ML IV SOLN
INTRAVENOUS | Status: DC | PRN
  Administered 2021-09-16: 100 mg via INTRAVENOUS

## 2021-09-16 MED ORDER — MIDAZOLAM HCL 2 MG/2ML IJ SOLN
INTRAMUSCULAR | Status: DC | PRN
  Administered 2021-09-16 (×2): 1 mg via INTRAVENOUS

## 2021-09-16 MED ORDER — FENTANYL CITRATE PF 50 MCG/ML IJ SOSY: 25.0000 ug | PREFILLED_SYRINGE | INTRAMUSCULAR | Status: DC | PRN

## 2021-09-16 MED ORDER — ORAL CARE MOUTH RINSE: 15.0000 mL | Freq: Once | OROMUCOSAL | Status: AC

## 2021-09-16 MED ORDER — CHLORHEXIDINE GLUCONATE 0.12 % MT SOLN
15.0000 mL | Freq: Once | OROMUCOSAL | Status: AC
Start: 2021-09-16 — End: 2021-09-16
  Administered 2021-09-16: 15 mL via OROMUCOSAL

## 2021-09-16 MED ORDER — PROPOFOL 10 MG/ML IV BOLUS
INTRAVENOUS | Status: DC | PRN
  Administered 2021-09-16: 150 mg via INTRAVENOUS

## 2021-09-16 MED ORDER — SODIUM CHLORIDE 0.9 % IV SOLN
INTRAVENOUS | Status: AC
  Filled 2021-09-16: qty 250

## 2021-09-16 MED ORDER — ONDANSETRON HCL 4 MG/2ML IJ SOLN: 4.0000 mg | Freq: Four times a day (QID) | INTRAMUSCULAR | Status: DC | PRN

## 2021-09-16 MED ORDER — ONDANSETRON HCL 4 MG/2ML IJ SOLN
INTRAMUSCULAR | Status: DC | PRN
  Administered 2021-09-16: 4 mg via INTRAVENOUS

## 2021-09-16 MED ORDER — DEXAMETHASONE SODIUM PHOSPHATE 10 MG/ML IJ SOLN
INTRAMUSCULAR | Status: DC | PRN
  Administered 2021-09-16: 5 mg via INTRAVENOUS

## 2021-09-16 MED ORDER — MIDAZOLAM HCL 2 MG/2ML IJ SOLN
INTRAMUSCULAR | Status: AC
  Filled 2021-09-16: qty 2

## 2021-09-16 MED ORDER — MORPHINE SULFATE (PF) 4 MG/ML IV SOLN: 2.0000 mg | INTRAVENOUS | Status: DC | PRN

## 2021-09-16 MED ORDER — DOCUSATE SODIUM 100 MG PO CAPS: 100.0000 mg | ORAL_CAPSULE | Freq: Two times a day (BID) | ORAL | Status: DC

## 2021-09-16 MED ORDER — LIDOCAINE 2% (20 MG/ML) 5 ML SYRINGE
INTRAMUSCULAR | Status: DC | PRN
  Administered 2021-09-16: 60 mg via INTRAVENOUS

## 2021-09-16 MED ORDER — PHENYLEPHRINE 40 MCG/ML (10ML) SYRINGE FOR IV PUSH (FOR BLOOD PRESSURE SUPPORT)
PREFILLED_SYRINGE | INTRAVENOUS | Status: DC | PRN
  Administered 2021-09-16 (×3): 80 ug via INTRAVENOUS

## 2021-09-16 MED ORDER — SENNOSIDES-DOCUSATE SODIUM 8.6-50 MG PO TABS: 1.0000 | ORAL_TABLET | Freq: Every day | ORAL | Status: DC | PRN

## 2021-09-16 MED ORDER — FENTANYL CITRATE (PF) 250 MCG/5ML IJ SOLN
INTRAMUSCULAR | Status: AC
  Filled 2021-09-16: qty 5

## 2021-09-16 MED ORDER — ONDANSETRON HCL 4 MG/2ML IJ SOLN
INTRAMUSCULAR | Status: AC
  Administered 2021-09-16: 4 mg via INTRAVENOUS
  Filled 2021-09-16: qty 2

## 2021-09-16 MED ORDER — MORPHINE SULFATE (PF) 4 MG/ML IV SOLN
INTRAVENOUS | Status: AC
  Administered 2021-09-16: 2 mg via INTRAVENOUS
  Filled 2021-09-16: qty 1

## 2021-09-16 MED ORDER — FENTANYL CITRATE (PF) 250 MCG/5ML IJ SOLN
INTRAMUSCULAR | Status: DC | PRN
  Administered 2021-09-16: 50 ug via INTRAVENOUS
  Administered 2021-09-16: 100 ug via INTRAVENOUS

## 2021-09-16 MED ORDER — SODIUM CHLORIDE 0.9 % IV SOLN
2.0000 g | INTRAVENOUS | Status: AC
  Administered 2021-09-16: 2 g via INTRAVENOUS
  Filled 2021-09-16: qty 2

## 2021-09-16 MED ORDER — ROCURONIUM BROMIDE 10 MG/ML (PF) SYRINGE
PREFILLED_SYRINGE | INTRAVENOUS | Status: DC | PRN
  Administered 2021-09-16: 50 mg via INTRAVENOUS

## 2021-09-16 NOTE — Transfer of Care (Signed)
Immediate Anesthesia Transfer of Care Note  Patient: Theresa Rocha  Procedure(s) Performed: CT MICROWAVE ABLATION  Patient Location: PACU  Anesthesia Type:General  Level of Consciousness: awake and alert   Airway & Oxygen Therapy: Patient Spontanous Breathing and Patient connected to face mask oxygen  Post-op Assessment: Report given to RN and Post -op Vital signs reviewed and stable  Post vital signs: Reviewed and stable  Last Vitals:  Vitals Value Taken Time  BP 133/86 09/16/21 1038  Temp    Pulse 96 09/16/21 1040  Resp 28 09/16/21 1040  SpO2 100 % 09/16/21 1040  Vitals shown include unvalidated device data.  Last Pain: There were no vitals filed for this visit.       Complications: No notable events documented.

## 2021-09-16 NOTE — Anesthesia Postprocedure Evaluation (Signed)
Anesthesia Post Note  Patient: Theresa Rocha  Procedure(s) Performed: CT MICROWAVE ABLATION     Patient location during evaluation: PACU Anesthesia Type: General Level of consciousness: awake Pain management: pain level controlled Vital Signs Assessment: post-procedure vital signs reviewed and stable Respiratory status: spontaneous breathing Cardiovascular status: stable Postop Assessment: no apparent nausea or vomiting Anesthetic complications: no   No notable events documented.  Last Vitals:  Vitals:   09/16/21 1213 09/16/21 1300  BP: (!) 143/80 138/88  Pulse: 79 87  Resp: 14 16  Temp:    SpO2: 94% 95%    Last Pain:  Vitals:   09/16/21 1300  PainSc: 7                  Modest Draeger

## 2021-09-16 NOTE — H&P (Deleted)
  The note originally documented on this encounter has been moved the the encounter in which it belongs.  

## 2021-09-16 NOTE — Discharge Instructions (Signed)
Cryoablation of Hepatic Tumors, Care After  This sheet gives you information about how to care for yourself after your procedure. Your health care provider may also give you more specific instructions. If you have problems or questions, contact your health care provider.  What can I expect after the procedure? After your procedure, it is common to have pain and discomfort in the upper abdomen. You may be given prescription pain medicine to control this if the pain is severe.  Follow these instructions at home: Puncture site care Follow instructions from your health care provider about how to take care of your incision. Make sure you:  - Wash your hands with soap and water before you change your bandage (dressing). If soap and water are not available, use hand sanitizer.  - Ok to shower 48 hours following procedure. Recommend showering with bandage on, remove bandage immediately after showering and pat area dry. No further dressing changes needed after this- ensure area remains clean and dry until fully healed.  - No submerging (swimming, bathing) for 7 days post-procedure. Check your incision area every day for signs of infection. Check for: - Redness, swelling, or pain. - Fluid or blood. - Warmth. - Pus or a bad smell. Activity Rest as often as needing during the first few days of recovery. No stooping, bending, or lifting more than 10 pounds for 1 week.  General instructions To prevent or treat constipation while you are taking prescription pain medicine, your health care provider may recommend that you: - Drink enough fluid to keep your urine clear or pale yellow. - Take over-the-counter or prescription medicines. - Eat foods that are high in fiber, such as fresh fruits and vegetables, whole grains, and beans. - Limit foods that are high in fat and processed sugars, such as fried and sweet foods. - Do not use any products that contain nicotine or tobacco, such as cigarettes and  e-cigarettes. If you need help quitting, ask your health care provider. - Keep all follow-up visits as told by your health care provider. This is important.  3-4 week televisit with the doctor who performed the procedure. Our office will call you to set up the appointment.    Contact a health care provider if: You cannot pass gas. You are unable to have a bowel movement within 3 days. You have a skin rash.  Get help right away if: You have a fever. You have severe or lasting pain in your abdomen, shoulder, or back. You have trouble swallowing or breathing. You have severe weakness or dizziness. You have chest pain or shortness of breath.   This information is not intended to replace advice given to you by your health care provider. Make sure you discuss any questions you have with your health care provider.

## 2021-09-16 NOTE — H&P (Signed)
Chief Complaint: Patient was seen in consultation today for image guided microwave ablation of [place] at the request of Yamagata,Glenn  Referring Physician(s): Aletta Edouard  Supervising Physician: Aletta Edouard  Patient Status: [facility] - In-pt  History of Present Illness: Theresa Rocha is a 55 y.o. female with PMHs of COPD, GERD, chronic hepatitis C, cirrhosis, breast cancer, and liver lesions highly suspicious for [place]. She has been under imaging surveillance since 2020, and she was referred to Dr. Kathlene Cote for possible ablation/local therapy of liver lesions suspicious for small hepatocellular carcinomas. She had a consultation visit with Dr. Kathlene Cote on 07/21/21 when treatment options for the liver lesions were discussed with the patient, and she was deemed an appropriate candidate for image guided thermal ablation of the liver lesion.  After thorough discussion and shared decision making, patient decided to proceed with image guided thermal ablation of the left lobe segment II liver lesion.  Patient presents to [facility] IR for the procedure today.   Patient laying in bed, not in acute distress.  Denise headache, fever, chills, shortness of breath, cough, chest pain, abdominal pain, nausea ,vomiting, and bleeding.   Past Medical History:  Diagnosis Date   Anemia    Breast cancer ([place]) 08/30/2019   Cancer ([place])    liver- just diagnosed, not started treatment yet   Cervicalgia    COPD (chronic obstructive pulmonary disease) ([place])    GERD (gastroesophageal reflux disease)    Headache    Hepatic cirrhosis (H[facility])    Hepatitis C    resolved 2018   Hepatocellular carcinoma (H[facility])    IBS (irritable bowel syndrome)    Lactose intolerance    Personal history of radiation therapy 11/2019   Right breast   Portal hypertension ([place])    Raynaud's syndrome    Thrombocytopenia ([place])     Past Surgical History:  Procedure Laterality Date   BREAST BIOPSY Right 09/2019   BREAST LUMPECTOMY Right  09/2019   BREAST LUMPECTOMY WITH AXILLARY LYMPH NODE BIOPSY Right 10/16/2019   Procedure: RIGHT BREAST LUMPECTOMY WITH SENTINEL LYMPH NODE BIOPSY;  Surgeon: Stark Klein, MD;  Location: [facility];  Service: General;  Laterality: Right;   COLONOSCOPY     HAMMER TOE SURGERY Left 2012   pins placed in great toe, 2nd,3rd, 4th toes, all pins removed except great toe   IR PARACENTESIS  11/13/2019   IR RADIOLOGIST EVAL & MGMT  08/16/2019   IR RADIOLOGIST EVAL & MGMT  02/06/2020   IR RADIOLOGIST EVAL & MGMT  06/26/2020   IR RADIOLOGIST EVAL & MGMT  12/31/2020   IR RADIOLOGIST EVAL & MGMT  07/21/2021   PELVIC LAPAROSCOPY Bilateral 1992   UPPER GASTROINTESTINAL ENDOSCOPY  2002   had esophageal dilitation    Allergies: Hydromorphone hcl and Aspirin  Medications: Prior to Admission medications   Medication Sig Start Date End Date Taking? Authorizing Provider  dicyclomine (BENTYL) 20 MG tablet Take one tablet every 4-6 hours as needed for abdominal cramping 11/21/19  Yes Irene Shipper, MD  loperamide (IMODIUM) 2 MG capsule Take 2 mg by mouth daily as needed for diarrhea or loose stools.    Yes [provider]  pantoprazole (PROTONIX) 40 MG tablet Take 1 tablet (40 mg total) by mouth daily. Office visit for future refills 06/16/21  Yes Irene Shipper, MD  tamoxifen (NOLVADEX) 20 MG tablet TAKE 1 TABLET BY MOUTH EVERY DAY 10/13/20  Yes Truitt Merle, MD  cephALEXin (KEFLEX) 500 MG capsule Take 1 capsule (500 mg  total) by mouth 3 (three) times daily. Patient not taking: Reported on 08/25/2021 05/20/21   Trula Slade, DPM  KLOR-CON M20 20 MEQ tablet TAKE 1 TABLET BY MOUTH EVERY DAY Patient not taking: Reported on 08/25/2021 03/10/20   Irene Shipper, MD  ondansetron (ZOFRAN) 8 MG tablet Take 1 tablet (8 mg total) by mouth every 8 (eight) hours as needed for nausea or vomiting. 08/02/19   Alla Feeling, NP  promethazine (PHENERGAN) 25 MG tablet Take 1 tablet (25 mg total) by mouth every 8 (eight) hours as  needed for nausea or vomiting. 05/25/21   Edrick Kins, DPM  rivaroxaban (XARELTO) 10 MG TABS tablet Take 1 tablet (10 mg total) by mouth daily. Patient not taking: Reported on 08/25/2021 05/20/21   Trula Slade, DPM     Family History  Problem Relation Age of Onset   Liver cancer Mother    Lung cancer Mother    Lung cancer Father    Liver cancer Maternal Grandmother    Colon cancer Neg Hx    Esophageal cancer Neg Hx    Stomach cancer Neg Hx    Rectal cancer Neg Hx     Social History   Socioeconomic History   Marital status: Married    Spouse name: Not on file   Number of children: Not on file   Years of education: Not on file   Highest education level: Not on file  Occupational History   Not on file  Tobacco Use   Smoking status: Every Day    Packs/day: 0.50    Years: 30.00    Pack years: 15.00    Types: Cigarettes    Start date: 11/29/1978   Smokeless tobacco: Never  Vaping Use   Vaping Use: Never used  Substance and Sexual Activity   Alcohol use: Not Currently    Alcohol/week: 6.0 standard drinks    Types: 6 Cans of beer per week    Comment: stopped 06/2019   Drug use: No   Sexual activity: Not Currently    Partners: Male  Other Topics Concern   Not on file  Social History Narrative   Not on file   Social Determinants of Health   Financial Resource Strain: Not on file  Food Insecurity: Not on file  Transportation Needs: Not on file  Physical Activity: Not on file  Stress: Not on file  Social Connections: Not on file     Review of Systems: A 12 point ROS discussed and pertinent positives are indicated in the HPI above.  All other systems are negative.  Vital Signs: BP 112/82   Pulse 73   Temp 98.4 F (36.9 C) (Oral)   Ht 5\' 3"  (1.6 m)   Wt 110 lb (49.9 kg)   LMP 04/20/2013   SpO2 100%   BMI 19.49 kg/m   Physical Exam Vitals and nursing note reviewed.  Constitutional:      General: Patient is not in acute distress.    Appearance:  Normal appearance. Patient is not ill-appearing.  HENT:     Head: Normocephalic and atraumatic.     Mouth/Throat:     Mouth: Mucous membranes are moist.     Pharynx: Oropharynx is clear.  Cardiovascular:     Rate and Rhythm: Normal rate and regular rhythm.     Pulses: Normal pulses.     Heart sounds: Normal heart sounds.  Pulmonary:     Effort: Pulmonary effort is normal.  Breath sounds: Normal breath sounds.  Abdominal:     General: Abdomen is flat. Bowel sounds are normal.     Palpations: Abdomen is soft.  Musculoskeletal:     Cervical back: Neck supple.  Skin:    General: Skin is warm and dry.     Coloration: Skin is not jaundiced or pale.  Neurological:     Mental Status: Patient is alert and oriented to person, place, and time.  Psychiatric:        Mood and Affect: Mood normal.        Behavior: Behavior normal.        Judgment: Judgment normal.    MD Evaluation Airway: WNL Heart: WNL Abdomen: WNL Chest/ Lungs: WNL ASA  Classification: 3 Mallampati/Airway Score: Two  Imaging: No results found.  Labs:  CBC: Recent Labs    10/02/20 0954 01/02/21 0910 01/02/21 1047 05/01/21 1113 09/01/21 1316  WBC 5.2 4.8  --  5.6 3.9*  HGB 14.5 9.6*  --  11.0* 8.7*  HCT 43.9 30.4* 27.6* 35.6* 30.8*  PLT 78* 77*  --  68* 72*    COAGS: Recent Labs    09/16/21 0730  INR 1.6*    BMP: Recent Labs    01/02/21 0910 05/01/21 1113 09/01/21 1316 09/16/21 0730  NA 140 139 143 139  K 3.7 3.7 3.9 3.6  CL 112* 104 111 108  CO2 22 27 25 26   GLUCOSE 102* 107* 122* 97  BUN 4* 4* 6 <5*  CALCIUM 8.0* 8.6* 8.3* 8.1*  CREATININE 0.72 0.76 0.58 0.51  GFRNONAA >60 >60 >60 >60    LIVER FUNCTION TESTS: Recent Labs    10/02/20 0954 01/02/21 0910 05/01/21 1113 09/16/21 0730  BILITOT 0.9 0.5 1.3* 1.1  AST 60* 43* 76* 79*  ALT 28 23 31  36  ALKPHOS 89 77 82 87  PROT 7.8 6.7 7.5 6.9  ALBUMIN 3.8 3.4* 3.5 3.1*    TUMOR MARKERS: Recent Labs    12/16/20 1106   AFPTM 4.4    Assessment and Plan: 55 y.o. female with liver lesions highly suspicious for [facility], who is familiar to our service from consultation with Dr. Kathlene Cote on 07/21/21 to discuss treatment options for the liver lesions. She was deemed an appropriate candidate for image guided thermal ablation of left lobe segment II liver lesion.   Patient [facility] to [facility] IR for the procedure today.  NPO since MN. VSS CBC with leukocytopenia WBC 3.7, anemia hgb 8.8, plt 55 - all at baseline  CMP stable INR 1.6 today, slightly higher than baseline on 1.3~1.4 - discussed with Dr. Kathlene Cote, [place] to proceed.  Not on AC/AP tx.   Risks and benefits discussed with the patient including, but not limited to bleeding, infection, liver failure, bile duct injury, or damage to adjacent structures.  All of the patient's questions were answered, patient is agreeable to proceed. Consent signed and in chart.   Thank you for this interesting consult.  I greatly enjoyed meeting Theresa Rocha and look forward to participating in their care.  A copy of this report was sent to the requesting provider on this date.  Electronically Signed: Tera Mater, PA-C 09/16/2021, 8:23 AM   I spent a total of    25 Minutes in face to face in clinical consultation, greater than 50% of which was counseling/coordinating care for image guided thermal ablation of a left  liver lesion.   This chart was dictated using voice recognition software.  Despite best  efforts to proofread,  errors can occur which can change the documentation meaning.

## 2021-09-16 NOTE — Progress Notes (Addendum)
Patient is s/p image guided thermal ablation of a hepatic lesion with Dr. Kathlene Cote today.  Patient tolerated procedure well, was transferred to PACU.  Patient seen in PACU at 1443 hrs.  Patient states that she is doing okay, denies nausea, vomiting, abdominal pain, complaints around the puncture site. She states the only complaint she has is pain in her left side probably from laying on her left side during the procedure.  VSS Upon evaluation, patient sitting in a stretcher not in acute distress. Positive dressing on epigastric puncture site. Site is unremarkable with no erythema, edema, tenderness, bleeding or drainage. Dressing otherwise clean, dry, and intact.  Patient still has a Foley catheter, urine to bag appears clear.   Foley catheter to be removed, patient states that she normally does not urinate for 8 to 12 hours and she is unsure she will be ready to urinate once Foley is removed. Discussed with Dr. Kathlene Cote, okay to discharge patient even if she does not urinate after Foley is removed. Patient to call IR if she cannot urinate tomorrow morning.   Patient will need Dr. Kathlene Cote via televisit for follow-up. Order placed.  Please call IR for questions and concerns regarding thermal ablation of a hepatic lesion.  Armando Gang Jediah Horger PA-C 09/16/2021 3:10 PM

## 2021-09-16 NOTE — Procedures (Signed)
Interventional Radiology Procedure Note  Procedure: Thermal ablation of hepatocellular carcinoma  Anesthesia: General  Complications: None  Estimated Blood Loss: < 10 mL  Findings: Left lobe HCC ablated with single NeuWave PR probe at 65W for 8 minutes under [place] and CT guidance.   Plan: PACU recovery with possible discharge today.  [name]. Kathlene Cote, M.D Pager:  [phone]

## 2021-09-16 NOTE — Sedation Documentation (Signed)
Anesthesia in to sedate and monitor. 

## 2021-09-16 NOTE — Anesthesia Procedure Notes (Signed)
Procedure Name: Intubation Date/Time: 09/16/2021 8:56 AM Performed by: Sharlette Dense, CRNA Pre-anesthesia Checklist: Patient identified, Emergency Drugs available, Suction available and Patient being monitored Patient Re-evaluated:Patient Re-evaluated prior to induction Oxygen Delivery Method: Circle system utilized Preoxygenation: Pre-oxygenation with 100% oxygen Induction Type: IV induction Ventilation: Mask ventilation without difficulty Laryngoscope Size: Miller and 2 Grade View: Grade I Tube type: Oral Tube size: 7.0 mm Number of attempts: 1 Airway Equipment and Method: Stylet Placement Confirmation: ETT inserted through vocal cords under direct vision, positive ETCO2 and breath sounds checked- equal and bilateral Secured at: 21 cm Tube secured with: Tape Dental Injury: Teeth and Oropharynx as per pre-operative assessment

## 2021-09-17 ENCOUNTER — Encounter (HOSPITAL_COMMUNITY): Payer: Self-pay | Admitting: Interventional Radiology

## 2021-09-17 NOTE — Discharge Summary (Signed)
Patient ID: Theresa Rocha MRN: [id number] DOB/AGE: 1966-05-17 55 y.o.  Admit date: 09/16/2021 Discharge date: 09/16/2021  Supervising Physician: Aletta Edouard  Patient Status: [facility] - In-pt  Admission Diagnoses: liver lesion  Discharge Diagnoses:  Active Problems:   Hepatocellular carcinoma [facility])   Discharged Condition: good  Hospital Course:   Patient presented to [facility] IR for an image-guided hepatic mass thermal ablation with Dr. Kathlene Cote. Procedure occurred without major complications and patient was transferred to PACU in stable condition (VSS, puncture site stable.)    Patient awake and alert, no complaints at this time. Foley catheter removed without difficulty. Patient denies epigastric puncture site pain or hematoma. Puncture site stable. Plan to discharge home today and follow-up with Dr. Kathlene Cote for televisit 3-4 weeks after discharge.   Consults: None  Significant Diagnostic Studies: none   Treatments: Thermal ablation   Discharge Exam: Blood pressure 126/72, pulse (!) 101, temperature 98.8 F (37.1 C), resp. rate 16, last menstrual period 04/20/2013, SpO2 95 %.  Physical Exam Vitals and nursing note reviewed.  Constitutional:      General: Patient is not in acute distress.    Appearance: Normal appearance. Patient is not ill-appearing.  HENT:     Head: Normocephalic and atraumatic.     Mouth/Throat:     Mouth: Mucous membranes are moist.     Pharynx: Oropharynx is clear.  Cardiovascular:     Rate and Rhythm: Normal rate and regular rhythm.     Pulses: Normal pulses.     Heart sounds: Normal heart sounds.  Pulmonary:     Effort: Pulmonary effort is normal.     Breath sounds: Normal breath sounds.  Abdominal:     General: Abdomen is flat. Bowel sounds are normal.     Palpations: Abdomen is soft.  Musculoskeletal:     Cervical back: Neck supple.  Skin:    General: Skin is warm and dry.     Coloration: Skin is not jaundiced or pale.  Positive  dressing on epigastric puncture site. Site is unremarkable with no erythema, edema, tenderness, bleeding or drainage. Dressing clean, dry, and intact.   Neurological:     Mental Status: Patient is alert and oriented to person, place, and time.  Psychiatric:        Mood and Affect: Mood normal.        Behavior: Behavior normal.        Judgment: Judgment normal.    Disposition: Discharge disposition: 01-Home or Self Care       Discharge Instructions     Call MD for:   Complete by: As directed    Call MD for:  difficulty breathing, headache or visual disturbances   Complete by: As directed    Call MD for:  extreme fatigue   Complete by: As directed    Call MD for:  hives   Complete by: As directed    Call MD for:  persistant dizziness or light-headedness   Complete by: As directed    Call MD for:  persistant nausea and vomiting   Complete by: As directed    Call MD for:  redness, tenderness, or signs of infection (pain, swelling, redness, odor or green/yellow discharge around incision site)   Complete by: As directed    Call MD for:  severe uncontrolled pain   Complete by: As directed    Call MD for:  temperature >100.4   Complete by: As directed    Diet - low sodium heart healthy  Complete by: As directed    Discharge instructions   Complete by: As directed    Do not submerge the upper abdomen puncture site under water for 7 days. No bending, stooping, lifting more than 10 pounds for 7 day.   Discharge wound care:   Complete by: As directed    Keep the dressing x 24 hours, no shower for 24 hours. May take shower with the dressing on tomorrow, take the dressing off after shower and pat dry the area completely and place a new dressing. Check the site for infection such as redness of skin or thick drainage.  Keep the site clean and dry. No submerging (bathing or swimming) for 7 days.   Increase activity slowly   Complete by: As directed       Allergies as of 09/16/2021        Reactions   Hydromorphone Hcl Nausea And Vomiting   Aspirin Nausea Only        Medication List     TAKE these medications    cephALEXin 500 MG capsule Commonly known as: KEFLEX Take 1 capsule (500 mg total) by mouth 3 (three) times daily.   dicyclomine 20 MG tablet Commonly known as: Bentyl Take one tablet every 4-6 hours as needed for abdominal cramping   Klor-Con M20 20 MEQ tablet Generic drug: potassium chloride SA TAKE 1 TABLET BY MOUTH EVERY DAY   loperamide 2 MG capsule Commonly known as: IMODIUM Take 2 mg by mouth daily as needed for diarrhea or loose stools.   ondansetron 8 MG tablet Commonly known as: ZOFRAN Take 1 tablet (8 mg total) by mouth every 8 (eight) hours as needed for nausea or vomiting.   pantoprazole 40 MG tablet Commonly known as: PROTONIX Take 1 tablet (40 mg total) by mouth daily. Office visit for future refills   promethazine 25 MG tablet Commonly known as: PHENERGAN Take 1 tablet (25 mg total) by mouth every 8 (eight) hours as needed for nausea or vomiting.   rivaroxaban 10 MG Tabs tablet Commonly known as: XARELTO Take 1 tablet (10 mg total) by mouth daily.   tamoxifen 20 MG tablet Commonly known as: NOLVADEX TAKE 1 TABLET BY MOUTH EVERY DAY               Discharge Care Instructions  (From admission, onward)           Start     Ordered   09/16/21 0000  Discharge wound care:       Comments: Keep the dressing x 24 hours, no shower for 24 hours. May take shower with the dressing on tomorrow, take the dressing off after shower and pat dry the area completely and place a new dressing. Check the site for infection such as redness of skin or thick drainage.  Keep the site clean and dry. No submerging (bathing or swimming) for 7 days.   09/16/21 1532              Electronically Signed: Tera Mater, PA-C 09/17/2021, 10:00 AM   I have spent Greater Than 30 Minutes discharging Theresa Rocha.

## 2021-09-21 ENCOUNTER — Telehealth: Payer: Self-pay | Admitting: Radiology

## 2021-09-21 NOTE — Telephone Encounter (Signed)
Received call from patient this morning regarding some persistent left shoulder/upper quadrant abdominal discomfort?  diaphragmatic irritation following CT-guided thermal ablation of left lobe HCC on 10/19.  Patient states she was having similar pain prior to discharge with little relief from morphine given at the time.  She has also been taking occasional baclofen 10 mg for spasms in region with some minimal relief.  She reports no fever, chills, nausea, vomiting or bleeding.  She states she has some hydrocodone at home which she has not taken.  Above discussed with Dr. Kathlene Cote.  He recommends ibuprofen 400 mg every 6 hours with food the next few days along with hydrocodone every 4 hours as needed for refractory pain.  Patient was told to contact our service if symptoms persist or she develops fever or chills.

## 2021-09-29 ENCOUNTER — Emergency Department (HOSPITAL_BASED_OUTPATIENT_CLINIC_OR_DEPARTMENT_OTHER): Payer: BC Managed Care – PPO

## 2021-09-29 ENCOUNTER — Other Ambulatory Visit: Payer: Self-pay | Admitting: Internal Medicine

## 2021-09-29 ENCOUNTER — Observation Stay (HOSPITAL_BASED_OUTPATIENT_CLINIC_OR_DEPARTMENT_OTHER)
Admission: EM | Admit: 2021-09-29 | Discharge: 2021-10-01 | Disposition: A | Discharge status: Home / Self Care | Payer: BC Managed Care – PPO | Attending: Internal Medicine | Admitting: Internal Medicine

## 2021-09-29 ENCOUNTER — Encounter (HOSPITAL_BASED_OUTPATIENT_CLINIC_OR_DEPARTMENT_OTHER): Payer: Self-pay | Admitting: *Deleted

## 2021-09-29 ENCOUNTER — Ambulatory Visit
Admission: RE | Admit: 2021-09-29 | Discharge: 2021-09-29 | Disposition: A | Discharge status: Home / Self Care | Payer: BC Managed Care – PPO | Source: Ambulatory Visit | Attending: Internal Medicine | Admitting: Internal Medicine

## 2021-09-29 ENCOUNTER — Other Ambulatory Visit: Payer: Self-pay

## 2021-09-29 DIAGNOSIS — D696 Thrombocytopenia, unspecified: Secondary | ICD-10-CM | POA: Diagnosis not present

## 2021-09-29 DIAGNOSIS — Z20822 Contact with and (suspected) exposure to covid-19: Secondary | ICD-10-CM | POA: Diagnosis not present

## 2021-09-29 DIAGNOSIS — J449 Chronic obstructive pulmonary disease, unspecified: Secondary | ICD-10-CM | POA: Diagnosis not present

## 2021-09-29 DIAGNOSIS — Z853 Personal history of malignant neoplasm of breast: Secondary | ICD-10-CM | POA: Diagnosis not present

## 2021-09-29 DIAGNOSIS — R188 Other ascites: Secondary | ICD-10-CM | POA: Diagnosis not present

## 2021-09-29 DIAGNOSIS — R0602 Shortness of breath: Secondary | ICD-10-CM | POA: Diagnosis not present

## 2021-09-29 DIAGNOSIS — E876 Hypokalemia: Secondary | ICD-10-CM | POA: Diagnosis present

## 2021-09-29 DIAGNOSIS — J90 Pleural effusion, not elsewhere classified: Principal | ICD-10-CM | POA: Insufficient documentation

## 2021-09-29 DIAGNOSIS — M7989 Other specified soft tissue disorders: Secondary | ICD-10-CM | POA: Diagnosis not present

## 2021-09-29 DIAGNOSIS — D649 Anemia, unspecified: Secondary | ICD-10-CM

## 2021-09-29 DIAGNOSIS — R0600 Dyspnea, unspecified: Secondary | ICD-10-CM

## 2021-09-29 DIAGNOSIS — K219 Gastro-esophageal reflux disease without esophagitis: Secondary | ICD-10-CM | POA: Diagnosis not present

## 2021-09-29 DIAGNOSIS — Z79899 Other long term (current) drug therapy: Secondary | ICD-10-CM | POA: Insufficient documentation

## 2021-09-29 DIAGNOSIS — D500 Iron deficiency anemia secondary to blood loss (chronic): Secondary | ICD-10-CM | POA: Diagnosis not present

## 2021-09-29 DIAGNOSIS — K7460 Unspecified cirrhosis of liver: Secondary | ICD-10-CM | POA: Diagnosis not present

## 2021-09-29 DIAGNOSIS — C220 Liver cell carcinoma: Secondary | ICD-10-CM | POA: Diagnosis present

## 2021-09-29 DIAGNOSIS — J9811 Atelectasis: Secondary | ICD-10-CM | POA: Diagnosis not present

## 2021-09-29 DIAGNOSIS — R9431 Abnormal electrocardiogram [ECG] [EKG]: Secondary | ICD-10-CM

## 2021-09-29 DIAGNOSIS — F17210 Nicotine dependence, cigarettes, uncomplicated: Secondary | ICD-10-CM | POA: Diagnosis not present

## 2021-09-29 DIAGNOSIS — Z9889 Other specified postprocedural states: Secondary | ICD-10-CM

## 2021-09-29 DIAGNOSIS — R609 Edema, unspecified: Secondary | ICD-10-CM

## 2021-09-29 DIAGNOSIS — K760 Fatty (change of) liver, not elsewhere classified: Secondary | ICD-10-CM | POA: Diagnosis not present

## 2021-09-29 LAB — BASIC METABOLIC PANEL
Anion gap: 7 (ref 5–15)
BUN: <5 mg/dL — ABNORMAL LOW (ref 6–20)
CO2: 24 mmol/L (ref 22–32)
Calcium: 7.7 mg/dL — ABNORMAL LOW (ref 8.9–10.3)
Chloride: 103 mmol/L (ref 98–111)
Creatinine, Ser: 0.53 mg/dL (ref 0.44–1.00)
GFR, Estimated: >60 mL/min (ref >60)
Glucose, Bld: 87 mg/dL (ref 70–99)
Potassium: 3.3 mmol/L — ABNORMAL LOW (ref 3.5–5.1)
Sodium: 134 mmol/L — ABNORMAL LOW (ref 135–145)

## 2021-09-29 LAB — CBC WITH DIFFERENTIAL/PLATELET
Abs Immature Granulocytes: 0.03 10*3/uL (ref 0.00–0.07)
Basophils Absolute: 0 10*3/uL (ref 0.0–0.1)
Basophils Relative: 1 %
Eosinophils Absolute: 0.2 10*3/uL (ref 0.0–0.5)
Eosinophils Relative: 3 %
HCT: 29.7 % — ABNORMAL LOW (ref 36.0–46.0)
Hemoglobin: 8.7 g/dL — ABNORMAL LOW (ref 12.0–15.0)
Immature Granulocytes: 1 %
Lymphocytes Relative: 21 %
Lymphs Abs: 1.1 10*3/uL (ref 0.7–4.0)
MCH: 22.6 pg — ABNORMAL LOW (ref 26.0–34.0)
MCHC: 29.3 g/dL — ABNORMAL LOW (ref 30.0–36.0)
MCV: 77.1 fL — ABNORMAL LOW (ref 80.0–100.0)
Monocytes Absolute: 0.6 10*3/uL (ref 0.1–1.0)
Monocytes Relative: 11 %
Neutro Abs: 3.3 10*3/uL (ref 1.7–7.7)
Neutrophils Relative %: 63 %
Platelets: 108 10*3/uL — ABNORMAL LOW (ref 150–400)
RBC: 3.85 MIL/uL — ABNORMAL LOW (ref 3.87–5.11)
RDW: 24.9 % — ABNORMAL HIGH (ref 11.5–15.5)
Smear Review: NORMAL
WBC: 5.2 10*3/uL (ref 4.0–10.5)
nRBC: 0 % (ref 0.0–0.2)

## 2021-09-29 LAB — BRAIN NATRIURETIC PEPTIDE: B Natriuretic Peptide: 52 pg/mL (ref 0.0–100.0)

## 2021-09-29 LAB — TROPONIN I (HIGH SENSITIVITY): Troponin I (High Sensitivity): 3 ng/L (ref <18)

## 2021-09-29 MED ORDER — IOHEXOL 350 MG/ML SOLN
75.0000 mL | Freq: Once | INTRAVENOUS | Status: AC | PRN
  Administered 2021-09-29: 75 mL via INTRAVENOUS

## 2021-09-29 NOTE — ED Notes (Signed)
Attempted IV in right AC unsuccessful.

## 2021-09-29 NOTE — ED Provider Notes (Signed)
[place] DEPT [facility] Provider Note: Theresa Spurling, MD, FACEP  CSN: [id number] MRN: [id number] ARRIVAL: 09/29/21 at 2051 ROOM: [place]  09/29/21 10:49 PM Theresa Rocha is a 55 y.o. female with cirrhosis who had microwave ablation of a liver lesion about a week ago.  6 days ago she developed left sided pleuritic chest pain in her left lower chest radiating to her left shoulder.  This was transient and resolved after about a day.  Since then however she has had shortness of breath, worse with exertion.  Symptoms are moderate but persistent.  She is having no additional chest pain.  She saw her PCP today who did lab work.  She was called this evening and told that her D-dimer was 5.2 and that she should be evaluated for a PE.  She states her right lower extremity has been swollen, particularly the foot and ankle, for about the last week.  She is not having pain in that leg.  She has had similar swelling in her left foot and ankle following surgery in July.  She states her liver is not enlarged but she have some ascites accumulated.   Past Medical History:  Diagnosis Date   Anemia    Breast cancer ([place]) 08/30/2019   Cancer ([place])    liver- just diagnosed, not started treatment yet   Cervicalgia    COPD (chronic obstructive pulmonary disease) ([place])    GERD (gastroesophageal reflux disease)    Headache    Hepatic cirrhosis ([facility])    Hepatitis C    resolved 2018   Hepatocellular carcinoma ([facility])    IBS (irritable bowel syndrome)    Lactose intolerance    Personal history of radiation therapy 11/2019   Right breast   Portal hypertension ([place])    Raynaud's syndrome    Thrombocytopenia ([place])     Past Surgical History:  Procedure Laterality Date   BREAST BIOPSY Right 09/2019   BREAST LUMPECTOMY Right 09/2019   BREAST LUMPECTOMY WITH AXILLARY LYMPH NODE BIOPSY Right 10/16/2019   Procedure: RIGHT BREAST LUMPECTOMY  WITH SENTINEL LYMPH NODE BIOPSY;  Surgeon: Stark Klein, MD;  Location: [facility];  Service: General;  Laterality: Right;   COLONOSCOPY     HAMMER TOE SURGERY Left 2012   pins placed in great toe, 2nd,3rd, 4th toes, all pins removed except great toe   IR PARACENTESIS  11/13/2019   IR RADIOLOGIST EVAL & MGMT  08/16/2019   IR RADIOLOGIST EVAL & MGMT  02/06/2020   IR RADIOLOGIST EVAL & MGMT  06/26/2020   IR RADIOLOGIST EVAL & MGMT  12/31/2020   IR RADIOLOGIST EVAL & MGMT  07/21/2021   PELVIC LAPAROSCOPY Bilateral 1992   RADIOLOGY WITH ANESTHESIA N/A 09/16/2021   Procedure: CT MICROWAVE ABLATION;  Surgeon: Aletta Edouard, MD;  Location: [facility];  Service: Radiology;  Laterality: N/A;   UPPER GASTROINTESTINAL ENDOSCOPY  2002   had esophageal dilitation    Family History  Problem Relation Age of Onset   Liver cancer Mother    Lung cancer Mother    Lung cancer Father    Liver cancer Maternal Grandmother    Colon cancer Neg Hx    Esophageal cancer Neg Hx    Stomach cancer Neg Hx    Rectal cancer Neg Hx     Social History   Tobacco Use   Smoking status: Every Day    Packs/day: 0.50  Years: 30.00    Pack years: 15.00    Types: Cigarettes    Start date: 11/29/1978   Smokeless tobacco: Never  Vaping Use   Vaping Use: Never used  Substance Use Topics   Alcohol use: Yes    Alcohol/week: 6.0 standard drinks    Types: 6 Cans of beer per week    Comment: stopped 06/2019   Drug use: No    Prior to Admission medications   Medication Sig Start Date End Date Taking? Authorizing Provider  loperamide (IMODIUM) 2 MG capsule Take 2 mg by mouth daily as needed for diarrhea or loose stools.    Yes [provider]  ondansetron (ZOFRAN) 8 MG tablet Take 1 tablet (8 mg total) by mouth every 8 (eight) hours as needed for nausea or vomiting. 08/02/19  Yes Alla Feeling, NP  pantoprazole (PROTONIX) 40 MG tablet Take 1 tablet (40 mg total) by mouth daily. Office visit for future refills 06/16/21   Yes Irene Shipper, MD  tamoxifen (NOLVADEX) 20 MG tablet TAKE 1 TABLET BY MOUTH EVERY DAY 10/13/20  Yes Truitt Merle, MD  cephALEXin (KEFLEX) 500 MG capsule Take 1 capsule (500 mg total) by mouth 3 (three) times daily. Patient not taking: No sig reported 05/20/21   Trula Slade, DPM  dicyclomine (BENTYL) 20 MG tablet Take one tablet every 4-6 hours as needed for abdominal cramping 11/21/19   Irene Shipper, MD  KLOR-CON M20 20 MEQ tablet TAKE 1 TABLET BY MOUTH EVERY DAY Patient not taking: No sig reported 03/10/20   Irene Shipper, MD  promethazine (PHENERGAN) 25 MG tablet Take 1 tablet (25 mg total) by mouth every 8 (eight) hours as needed for nausea or vomiting. 05/25/21   Edrick Kins, DPM  rivaroxaban (XARELTO) 10 MG TABS tablet Take 1 tablet (10 mg total) by mouth daily. Patient not taking: No sig reported 05/20/21   Trula Slade, DPM    Allergies Hydromorphone hcl and Aspirin   REVIEW OF SYSTEMS  Negative except as noted here or in the History of Present Illness.   PHYSICAL EXAMINATION  Initial Vital Signs Blood pressure (!) 152/85, pulse (!) 105, temperature 98.2 F (36.8 C), temperature source Oral, resp. rate (!) 24, height 5\' 3"  (1.6 m), weight 49.9 kg, last menstrual period 04/20/2013, SpO2 92 %.  Examination General: Well-developed, well-nourished female in no acute distress; appearance consistent with age of record HENT: normocephalic; atraumatic Eyes: pupils equal, round and reactive to light; extraocular muscles intact Neck: supple Heart: regular rate and rhythm Lungs: clear to auscultation bilaterally Abdomen: soft; mildly distended; nontender; no hepatomegaly; bowel sounds present Extremities: No deformity; full range of motion; pulses normal Neurologic: Awake, alert and oriented; motor function intact in all extremities and symmetric; no facial droop Skin: Warm and dry Psychiatric: Normal mood and affect   RESULTS  Summary of this visit's results,  reviewed and interpreted by myself:   EKG Interpretation  Date/Time:  Tuesday September 29 2021 22:51:04 EDT Ventricular Rate:  90 PR Interval:  125 QRS Duration: 87 QT Interval:  414 QTC Calculation: 507 R Axis:   64 Text Interpretation: Sinus rhythm Abnormal R-wave progression, early transition Borderline prolonged QT interval Confirmed by Rene Sizelove [id number]) on 09/29/2021 10:57:22 PM       Laboratory Studies: Results for orders placed or performed during the hospital encounter of 09/29/21 (from the past 24 hour(s))  CBC with Differential/Platelet     Status: Abnormal   Collection Time: 09/29/21  10:50 PM  Result Value Ref Range   WBC 5.2 4.0 - 10.5 K/uL   RBC 3.85 (L) 3.87 - 5.11 MIL/uL   Hemoglobin 8.7 (L) 12.0 - 15.0 g/dL   HCT 29.7 (L) 36.0 - 46.0 %   MCV 77.1 (L) 80.0 - 100.0 fL   MCH 22.6 (L) 26.0 - 34.0 pg   MCHC 29.3 (L) 30.0 - 36.0 g/dL   RDW 24.9 (H) 11.5 - 15.5 %   Platelets 108 (L) 150 - 400 K/uL   nRBC 0.0 0.0 - 0.2 %   Neutrophils Relative % 63 %   Neutro Abs 3.3 1.7 - 7.7 K/uL   Lymphocytes Relative 21 %   Lymphs Abs 1.1 0.7 - 4.0 K/uL   Monocytes Relative 11 %   Monocytes Absolute 0.6 0.1 - 1.0 K/uL   Eosinophils Relative 3 %   Eosinophils Absolute 0.2 0.0 - 0.5 K/uL   Basophils Relative 1 %   Basophils Absolute 0.0 0.0 - 0.1 K/uL   WBC Morphology MORPHOLOGY UNREMARKABLE    RBC Morphology See Note    Smear Review Normal platelet morphology    Immature Granulocytes 1 %   Abs Immature Granulocytes 0.03 0.00 - 0.07 K/uL   Polychromasia PRESENT    Target Cells PRESENT    Spherocytes PRESENT    Stomatocytes PRESENT   Basic metabolic panel     Status: Abnormal   Collection Time: 09/29/21 10:50 PM  Result Value Ref Range   Sodium 134 (L) 135 - 145 mmol/L   Potassium 3.3 (L) 3.5 - 5.1 mmol/L   Chloride 103 98 - 111 mmol/L   CO2 24 22 - 32 mmol/L   Glucose, Bld 87 70 - 99 mg/dL   BUN <5 (L) 6 - 20 mg/dL   Creatinine, Ser 0.53 0.44 - 1.00 mg/dL    Calcium 7.7 (L) 8.9 - 10.3 mg/dL   GFR, Estimated >60 >60 mL/min   Anion gap 7 5 - 15  Brain natriuretic peptide     Status: None   Collection Time: 09/29/21 10:50 PM  Result Value Ref Range   B Natriuretic Peptide 52.0 0.0 - 100.0 pg/mL  Troponin I (High Sensitivity)     Status: None   Collection Time: 09/29/21 10:50 PM  Result Value Ref Range   Troponin I (High Sensitivity) 3 <18 ng/L   Imaging Studies: CT Angio Chest PE W and/or Wo Contrast  Result Date: 09/30/2021 CLINICAL DATA:  Concern for pulmonary embolism. EXAM: CT ANGIOGRAPHY CHEST WITH CONTRAST TECHNIQUE: Multidetector CT imaging of the chest was performed using the standard protocol during bolus administration of intravenous contrast. Multiplanar CT image reconstructions and MIPs were obtained to evaluate the vascular anatomy. CONTRAST:  69mL OMNIPAQUE IOHEXOL 350 MG/ML SOLN COMPARISON:  Chest radiograph dated 09/29/2021. FINDINGS: Cardiovascular: There is no cardiomegaly or pericardial effusion. The thoracic aorta is unremarkable. The origins of the great vessels of the aortic arch appear patent. Evaluation of the pulmonary arteries is limited due to respiratory motion artifact and consolidative changes of the left lung. No large or central pulmonary artery embolus identified. Mediastinum/Nodes: No hilar adenopathy. No mediastinal fluid collection. The esophagus is poorly visualized. Lungs/Pleura: Large left pleural effusion with consolidative changes of the majority of the left lower lobe and partial consolidation of the lingula. Findings may represent compressive atelectasis or pneumonia. Clinical correlation and follow-up to resolution recommended. There is background of emphysema. The central airways are patent. No pneumothorax. Upper Abdomen: Fatty liver and morphologic changes of cirrhosis.  Partially visualized small ascites. Indeterminate 3 x 4 cm enhancing lesion in the right lobe of the liver. This was better evaluated on the MRI  of 12/30/2020. Ill-defined 3 x 4 cm hypoenhancing area in the left lobe of the liver is not characterized. If there is history of trauma, a focal liver laceration is not excluded. Musculoskeletal: No acute osseous pathology. Review of the MIP images confirms the above findings. IMPRESSION: 1. No CT evidence of central pulmonary artery embolus. 2. Large left pleural effusion with compressive atelectasis of the majority of the left lower lobe and partial consolidation of the lingula. Pneumonia is not excluded. Clinical correlation and follow-up to resolution recommended. 3. Fatty liver and cirrhosis. Indeterminate enhancing lesion in the right lobe of the liver as seen on the MRI of 12/30/2020. Ill-defined hypoenhancing ar117ea in the left lobe of the liver is not characterized. A focal liver laceration is not excluded. 4. Partially visualized small ascites. 5. Aortic Atherosclerosis (ICD10-I70.0) and Emphysema (ICD10-J43.9). Electronically Signed   By: Anner Crete M.D.   On: 09/30/2021 00:39   US Venous Img Lower Bilateral (DVT)  Result Date: 09/30/2021 CLINICAL DATA:  Bilateral lower extremity swelling. EXAM: Bilateral LOWER EXTREMITY VENOUS DOPPLER ULTRASOUND TECHNIQUE: Gray-scale sonography with compression, as well as color and duplex ultrasound, were performed to evaluate the deep venous system(s) from the level of the common femoral vein through the popliteal and proximal calf veins. COMPARISON:  None. FINDINGS: VENOUS Normal compressibility of the common femoral, superficial femoral, and popliteal veins, as well as the visualized calf veins. Visualized portions of profunda femoral vein and great saphenous vein unremarkable. No filling defects to suggest DVT on grayscale or color Doppler imaging. Doppler waveforms show normal direction of venous flow, normal respiratory plasticity and response to augmentation. Limited views of the contralateral common femoral vein are unremarkable. OTHER None.  Limitations: none IMPRESSION: Negative. Electronically Signed   By: Anner Crete M.D.   On: 09/30/2021 00:20    ED COURSE and MDM  Nursing notes, initial and subsequent vitals signs, including pulse oximetry, reviewed and interpreted by myself.  Vitals:   09/29/21 2103 09/29/21 2105 09/29/21 2250 09/30/21 0030  BP: (!) 152/85  (!) 144/94 (!) 140/91  Pulse: (!) 105  87 95  Resp: (!) 24  17 (!) 23  Temp: 98.2 F (36.8 C)   98.3 F (36.8 C)  TempSrc: Oral   Oral  SpO2: 92%  96% 93%  Weight:  49.9 kg    Height:  5\' 3"  (1.6 m)     Medications  iohexol (OMNIPAQUE) 350 MG/ML injection 75 mL (75 mLs Intravenous Contrast Given 09/29/21 2359)   CT shows left pleural effusion which is likely causing her dyspnea.  She has anemia but this appears chronic.  We will have patient admitted.  Dr. Alcario Drought to admit to hospitalist service.   PROCEDURES  Procedures   ED DIAGNOSES     ICD-10-CM   1. Pleural effusion on left  J90     2. Swelling  R60.9 US Venous Img Lower Bilateral (DVT)    US Venous Img Lower Bilateral (DVT)    3. Shortness of breath  R06.02     4. Chronic anemia  D64.9          Adrinne Sze, Jenny Reichmann, MD 09/30/21 [phone]

## 2021-09-29 NOTE — ED Triage Notes (Signed)
Sob. States she had a D dimer today with results of 5.2. Her MD told her to come to the ER to be checked for a PE.

## 2021-09-30 ENCOUNTER — Observation Stay (HOSPITAL_COMMUNITY): Payer: BC Managed Care – PPO

## 2021-09-30 ENCOUNTER — Encounter (HOSPITAL_COMMUNITY): Payer: Self-pay | Admitting: Internal Medicine

## 2021-09-30 DIAGNOSIS — E876 Hypokalemia: Secondary | ICD-10-CM | POA: Diagnosis present

## 2021-09-30 DIAGNOSIS — D696 Thrombocytopenia, unspecified: Secondary | ICD-10-CM | POA: Diagnosis not present

## 2021-09-30 DIAGNOSIS — Z853 Personal history of malignant neoplasm of breast: Secondary | ICD-10-CM | POA: Diagnosis not present

## 2021-09-30 DIAGNOSIS — J90 Pleural effusion, not elsewhere classified: Secondary | ICD-10-CM | POA: Diagnosis not present

## 2021-09-30 DIAGNOSIS — K760 Fatty (change of) liver, not elsewhere classified: Secondary | ICD-10-CM | POA: Diagnosis not present

## 2021-09-30 DIAGNOSIS — Z79899 Other long term (current) drug therapy: Secondary | ICD-10-CM | POA: Diagnosis not present

## 2021-09-30 DIAGNOSIS — J449 Chronic obstructive pulmonary disease, unspecified: Secondary | ICD-10-CM | POA: Diagnosis not present

## 2021-09-30 DIAGNOSIS — M7989 Other specified soft tissue disorders: Secondary | ICD-10-CM | POA: Diagnosis not present

## 2021-09-30 DIAGNOSIS — K219 Gastro-esophageal reflux disease without esophagitis: Secondary | ICD-10-CM | POA: Diagnosis not present

## 2021-09-30 DIAGNOSIS — J9811 Atelectasis: Secondary | ICD-10-CM | POA: Diagnosis not present

## 2021-09-30 DIAGNOSIS — D500 Iron deficiency anemia secondary to blood loss (chronic): Secondary | ICD-10-CM | POA: Diagnosis present

## 2021-09-30 DIAGNOSIS — Z20822 Contact with and (suspected) exposure to covid-19: Secondary | ICD-10-CM | POA: Diagnosis not present

## 2021-09-30 DIAGNOSIS — J948 Other specified pleural conditions: Secondary | ICD-10-CM | POA: Diagnosis not present

## 2021-09-30 DIAGNOSIS — R188 Other ascites: Secondary | ICD-10-CM | POA: Diagnosis not present

## 2021-09-30 DIAGNOSIS — R0602 Shortness of breath: Secondary | ICD-10-CM | POA: Diagnosis not present

## 2021-09-30 DIAGNOSIS — R9431 Abnormal electrocardiogram [ECG] [EKG]: Secondary | ICD-10-CM

## 2021-09-30 DIAGNOSIS — F17210 Nicotine dependence, cigarettes, uncomplicated: Secondary | ICD-10-CM | POA: Diagnosis not present

## 2021-09-30 LAB — PROTEIN, PLEURAL OR PERITONEAL FLUID: Total protein, fluid: <3 g/dL

## 2021-09-30 LAB — GLUCOSE, PLEURAL OR PERITONEAL FLUID: Glucose, Fluid: 82 mg/dL

## 2021-09-30 LAB — BODY FLUID CELL COUNT WITH DIFFERENTIAL
Eos, Fluid: 0 %
Lymphs, Fluid: 39 %
Monocyte-Macrophage-Serous Fluid: 54 % (ref 50–90)
Neutrophil Count, Fluid: 7 % (ref 0–25)
Total Nucleated Cell Count, Fluid: 434 cu mm (ref 0–1000)

## 2021-09-30 LAB — LACTATE DEHYDROGENASE, PLEURAL OR PERITONEAL FLUID: LD, Fluid: 77 U/L — ABNORMAL HIGH (ref 3–23)

## 2021-09-30 LAB — RESP PANEL BY RT-PCR (FLU A&B, COVID) ARPGX2
Influenza A by PCR: NEGATIVE
Influenza B by PCR: NEGATIVE
SARS Coronavirus 2 by RT PCR: NEGATIVE

## 2021-09-30 LAB — TROPONIN I (HIGH SENSITIVITY): Troponin I (High Sensitivity): 3 ng/L (ref <18)

## 2021-09-30 MED ORDER — ONDANSETRON HCL 4 MG/2ML IJ SOLN: 4.0000 mg | Freq: Four times a day (QID) | INTRAMUSCULAR | Status: DC | PRN

## 2021-09-30 MED ORDER — NICOTINE 21 MG/24HR TD PT24
21.0000 mg | MEDICATED_PATCH | Freq: Every day | TRANSDERMAL | Status: DC
  Administered 2021-09-30 – 2021-10-01 (×2): 21 mg via TRANSDERMAL
  Filled 2021-09-30 (×2): qty 1

## 2021-09-30 MED ORDER — PROCHLORPERAZINE EDISYLATE 10 MG/2ML IJ SOLN
5.0000 mg | INTRAMUSCULAR | Status: DC | PRN
  Administered 2021-09-30: 5 mg via INTRAVENOUS
  Filled 2021-09-30: qty 2

## 2021-09-30 MED ORDER — LOPERAMIDE HCL 2 MG PO CAPS: 2.0000 mg | ORAL_CAPSULE | Freq: Every day | ORAL | Status: DC | PRN

## 2021-09-30 MED ORDER — MAGNESIUM SULFATE 2 GM/50ML IV SOLN
2.0000 g | Freq: Once | INTRAVENOUS | Status: AC
  Administered 2021-09-30: 2 g via INTRAVENOUS
  Filled 2021-09-30: qty 50

## 2021-09-30 MED ORDER — DICYCLOMINE HCL 20 MG PO TABS
20.0000 mg | ORAL_TABLET | Freq: Four times a day (QID) | ORAL | Status: DC | PRN
  Filled 2021-09-30: qty 1

## 2021-09-30 MED ORDER — ACETAMINOPHEN 650 MG RE SUPP: 650.0000 mg | Freq: Four times a day (QID) | RECTAL | Status: DC | PRN

## 2021-09-30 MED ORDER — OXYCODONE HCL 5 MG PO TABS
5.0000 mg | ORAL_TABLET | Freq: Once | ORAL | Status: AC
  Administered 2021-09-30: 5 mg via ORAL
  Filled 2021-09-30: qty 1

## 2021-09-30 MED ORDER — PANTOPRAZOLE SODIUM 40 MG PO TBEC
40.0000 mg | DELAYED_RELEASE_TABLET | Freq: Every day | ORAL | Status: DC
  Administered 2021-09-30 – 2021-10-01 (×2): 40 mg via ORAL
  Filled 2021-09-30 (×2): qty 1

## 2021-09-30 MED ORDER — POTASSIUM CHLORIDE CRYS ER 20 MEQ PO TBCR
40.0000 meq | EXTENDED_RELEASE_TABLET | Freq: Once | ORAL | Status: DC
  Filled 2021-09-30: qty 2

## 2021-09-30 MED ORDER — LIDOCAINE HCL 1 % IJ SOLN
INTRAMUSCULAR | Status: AC
  Administered 2021-09-30: 10 mL
  Filled 2021-09-30: qty 20

## 2021-09-30 MED ORDER — ACETAMINOPHEN 325 MG PO TABS
650.0000 mg | ORAL_TABLET | Freq: Four times a day (QID) | ORAL | Status: DC | PRN
  Administered 2021-09-30: 650 mg via ORAL
  Filled 2021-09-30: qty 2

## 2021-09-30 MED ORDER — POTASSIUM CHLORIDE 20 MEQ PO PACK
40.0000 meq | PACK | Freq: Once | ORAL | Status: AC
  Administered 2021-09-30: 40 meq via ORAL
  Filled 2021-09-30: qty 2

## 2021-09-30 MED ORDER — ONDANSETRON HCL 4 MG PO TABS: 4.0000 mg | ORAL_TABLET | Freq: Four times a day (QID) | ORAL | Status: DC | PRN

## 2021-09-30 MED ORDER — TAMOXIFEN CITRATE 10 MG PO TABS
20.0000 mg | ORAL_TABLET | Freq: Every day | ORAL | Status: DC
  Administered 2021-09-30 – 2021-10-01 (×2): 20 mg via ORAL
  Filled 2021-09-30 (×3): qty 2

## 2021-09-30 NOTE — ED Notes (Signed)
Report given to [facility]. 

## 2021-09-30 NOTE — Plan of Care (Addendum)
55 yo F with h/o cirrhosis, HCV, probably [place] just got suspicious lesion ablated by IR on 10/19.  In to ED with c/o SOB ever since procedure.  CT chest neg for PE but has large pleural effusion on left (odd that its left and not right side).  Presumably needs thoracentesis.  [facility] will assume care on arrival to accepting facility. Until arrival, care as per EDP. However, [facility] available 24/7 for questions and assistance.  Nursing staff, please page [facility] and Consults [phone]) as soon as the patient arrives the hospital.

## 2021-09-30 NOTE — Procedures (Signed)
Ultrasound-guided diagnostic and therapeutic left thoracentesis performed yielding 1.2 liters of yellow fluid. No immediate complications. Follow-up chest x-ray pending. The fluid was sent to the lab for preordered studies. EBL none.        

## 2021-09-30 NOTE — H&P (Signed)
History and Physical    Theresa Rocha MLY:[id number] DOB: August 07, 1966 DOA: 09/29/2021  PCP: Theresa Carol, MD  Patient coming from: Home.   I have personally briefly reviewed patient's old medical records in [place]  Chief Complaint: Abnormal D-dimer/Shortness of breath.  HPI: Theresa Rocha is a 55 y.o. 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  The patient underwent a microwave ablation of hepatic lesion.  She states that then she developed left lower chest wall pleuritic chest pain which improved after she took ibuprofen.  However, she has been progressively more dyspneic since then.  She denied precordial pressure-like chest pain, palpitations, diaphoresis, PND, but has had orthopnea and mild lower extremity edema.  No fever, chills, sore throat, rhinorrhea, hemoptysis, wheezing or productive cough.  Denied new abdominal pain, nausea, emesis,  constipation, melena or hematochezia.  She gets occasional diarrhea.  No dysuria, flank pain, frequency or hematuria.  Denied polyuria, polydipsia, polyphagia or blurred vision.  ED Course: Initial vital signs were temperature 98.2 F, pulse 105, respirations 24, BP 152/85 mmHg O2 sat 92% on room air.  Lab work: BNP was 52.0 pg/mL.  Troponin x2 normal.  CBC showed a white count of 5.2, hemoglobin 8.7 g/dL (at baseline) and platelets 108.  BMP showed a sodium 134, potassium 3.3 mmol/L.  Calcium was 7.7 mg/dL but the patient has been recently hypoalbuminemic.  Imaging: No DVT seen on bilateral venous Doppler.  2 BUN left  decubitus chest show a layering left pleural effusion.  CTA PE did not show any evidence of pulmonary embolism.  There is a large left pleural effusion with compressive atelectasis of the majority of the left lower lung and partial consolidation of the lingula.  Pneumonia is not excluded.  Fatty liver and cirrhosis.  There was an indeterminate enhancing lesion of the right lobe of the liver as seen on the MRI of 12/30/2020.  Partially visualized small sinusitis.  Aortic atherosclerosis and emphysema.  Review of Systems: As per HPI otherwise all other systems reviewed and are negative.  Past Medical History:  Diagnosis Date   Anemia    Breast cancer ([place]) 08/30/2019   Cancer ([place])    liver- just diagnosed, not started treatment yet   Cervicalgia    COPD (chronic obstructive pulmonary disease) (HCC)    GERD (gastroesophageal reflux disease)    Headache    Hepatic cirrhosis (HCC)    Hepatitis C    resolved 2018   Hepatocellular carcinoma (HCC)    IBS (irritable bowel syndrome)    Lactose intolerance    Personal history of radiation therapy 11/2019   Right breast   Portal hypertension ([place])    Raynaud's syndrome    Thrombocytopenia ([place])    Past Surgical History:  Procedure Laterality Date   BREAST BIOPSY Right 09/2019   BREAST LUMPECTOMY Right 09/2019   BREAST LUMPECTOMY WITH AXILLARY LYMPH NODE BIOPSY Right 10/16/2019   Procedure: RIGHT BREAST LUMPECTOMY WITH SENTINEL LYMPH NODE BIOPSY;  Surgeon: Stark Klein, MD;  Location: [facility];  Service: General;  Laterality: Right;   COLONOSCOPY     HAMMER TOE SURGERY Left  2012   pins placed in great toe, 2nd,3rd, 4th toes, all pins removed except great toe   IR PARACENTESIS  11/13/2019   IR RADIOLOGIST EVAL & MGMT  08/16/2019   IR RADIOLOGIST EVAL & MGMT  02/06/2020   IR RADIOLOGIST EVAL & MGMT  06/26/2020   IR RADIOLOGIST EVAL & MGMT  12/31/2020   IR RADIOLOGIST EVAL & MGMT  07/21/2021   PELVIC LAPAROSCOPY Bilateral 1992   RADIOLOGY WITH  ANESTHESIA N/A 09/16/2021   Procedure: CT MICROWAVE ABLATION;  Surgeon: Aletta Edouard, MD;  Location: [facility];  Service: Radiology;  Laterality: N/A;   UPPER GASTROINTESTINAL ENDOSCOPY  2002   had esophageal dilitation   Social History  reports that she has been smoking cigarettes. She started smoking about 42 years ago. She has a 15.00 pack-year smoking history. She has never used smokeless tobacco. She reports current alcohol use of about 6.0 standard drinks per week. She reports that she does not use drugs.  Allergies  Allergen Reactions   Hydromorphone Hcl Nausea And Vomiting   Aspirin Nausea Only   Family History  Problem Relation Age of Onset   Liver cancer Mother    Lung cancer Mother    Lung cancer Father    Liver cancer Maternal Grandmother    Colon cancer Neg Hx    Esophageal cancer Neg Hx    Stomach cancer Neg Hx    Rectal cancer Neg Hx    Prior to Admission medications   Medication Sig Start Date End Date Taking? Authorizing Provider  dicyclomine (BENTYL) 20 MG tablet Take one tablet every 4-6 hours as needed for abdominal cramping Patient taking differently: Take 20 mg by mouth See admin instructions. 20mg  every 4 to 6 hours as needed for abdominal cramping 11/21/19  Yes Irene Shipper, MD  ibuprofen (ADVIL) 200 MG tablet Take 400 mg by mouth every 6 (six) hours as needed for mild pain.   Yes [provider]  loperamide (IMODIUM) 2 MG capsule Take 2 mg by mouth daily as needed for diarrhea or loose stools.    Yes [provider]  nicotine (NICODERM CQ - DOSED IN MG/24 HOURS) 21 mg/24hr patch Place 21 mg onto the skin daily.   Yes [provider]  pantoprazole (PROTONIX) 40 MG tablet Take 1 tablet (40 mg total) by mouth daily. Office visit for future refills Patient taking differently: Take 40 mg by mouth daily. 06/16/21  Yes Irene Shipper, MD  promethazine (PHENERGAN) 25 MG tablet Take 1 tablet (25 mg total) by mouth every 8 (eight) hours as  needed for nausea or vomiting. 05/25/21  Yes Edrick Kins, DPM  tamoxifen (NOLVADEX) 20 MG tablet TAKE 1 TABLET BY MOUTH EVERY DAY Patient taking differently: Take 20 mg by mouth daily. 10/13/20  Yes Truitt Merle, MD  cephALEXin (KEFLEX) 500 MG capsule Take 1 capsule (500 mg total) by mouth 3 (three) times daily. Patient not taking: No sig reported 05/20/21   Trula Slade, DPM  KLOR-CON M20 20 MEQ tablet TAKE 1 TABLET BY MOUTH EVERY DAY Patient not taking: No sig reported 03/10/20   Irene Shipper, MD  rivaroxaban (XARELTO) 10 MG TABS tablet Take 1 tablet (10 mg total) by mouth daily. Patient not taking: No sig reported 05/20/21   Trula Slade, [place]   Physical Exam: Vitals:   09/30/21 0830 09/30/21 1130 09/30/21 1329 09/30/21 1330  BP: 121/77 116/82  (!) 129/97  Pulse: 92 87  88  Resp: Marland Kitchen)  22 (!) 24  19  Temp:   (S) 98.7 F (37.1 C)   TempSrc:   Oral   SpO2: 97% 99%  96%  Weight:      Height:       Constitutional: Chronically ill-appearing.  NAD, calm, comfortable Eyes: PERRL, lids and conjunctivae are pale. ENMT: Nasal cannula in place.  Mucous membranes are mildly dry.  Posterior pharynx clear of any exudate or lesions. Neck: normal, supple, no masses, no thyromegaly Respiratory: Decreased breath sounds on left lower lung field, no wheezing, no crackles. Normal respiratory effort. No accessory muscle use.  Cardiovascular: Regular rate and rhythm, no murmurs / rubs / gallops.  Trace bilateral lower extremity pitting edema. 2+ pedal pulses. No carotid bruits.  Abdomen: No distention.  Bowel sounds positive.  Soft, no tenderness, no masses palpated. No hepatosplenomegaly.  Musculoskeletal: Mild generalized weakness.  No clubbing / cyanosis. Good ROM, no contractures. Normal muscle tone.  Skin: no rashes, lesions, ulcers on limited dermatological examination. Neurologic: CN 2-12 grossly intact. Sensation intact, DTR normal. Strength 5/5 in all 4.  Psychiatric: Normal judgment and  insight. Alert and oriented x 3. Normal mood.   Labs on Admission: I have personally reviewed following labs and imaging studies  CBC: Recent Labs  Lab 09/29/21 2250  WBC 5.2  NEUTROABS 3.3  HGB 8.7*  HCT 29.7*  MCV 77.1*  PLT 108*    Basic Metabolic Panel: Recent Labs  Lab 09/29/21 2250  NA 134*  K 3.3*  CL 103  CO2 24  GLUCOSE 87  BUN <5*  CREATININE 0.53  CALCIUM 7.7*    GFR: Estimated Creatinine Clearance: 63.3 mL/min (by C-G formula based on SCr of 0.53 mg/dL).  Liver Function Tests: No results for input(s): AST, ALT, ALKPHOS, BILITOT, PROT, ALBUMIN in the last 168 hours.  Radiological Exams on Admission: DG Chest 2 View  Result Date: 09/30/2021 CLINICAL DATA:  Shortness of breath, cirrhosis, recent liver ablation for [place]. EXAM: CHEST - 2 VIEW COMPARISON:  05/11/2013 FINDINGS: Interval development of a new moderately large left effusion with lingula and left lower lobe collapse/consolidation. No pneumothorax. Right lung remains clear. Normal heart size and vascularity. Trachea midline. Postop changes of the right breast and axilla. No acute osseous finding. IMPRESSION: New moderately large left effusion with associated lingula and left lower lobe collapse/consolidation. Electronically Signed   By: Jerilynn Mages.  Shick M.D.   On: 09/30/2021 08:40   CT Angio Chest PE W and/or Wo Contrast  Result Date: 09/30/2021 CLINICAL DATA:  Concern for pulmonary embolism. EXAM: CT ANGIOGRAPHY CHEST WITH CONTRAST TECHNIQUE: Multidetector CT imaging of the chest was performed using the standard protocol during bolus administration of intravenous contrast. Multiplanar CT image reconstructions and MIPs were obtained to evaluate the vascular anatomy. CONTRAST:  44mL OMNIPAQUE IOHEXOL 350 MG/ML SOLN COMPARISON:  Chest radiograph dated 09/29/2021. FINDINGS: Cardiovascular: There is no cardiomegaly or pericardial effusion. The thoracic aorta is unremarkable. The origins of the great vessels of the  aortic arch appear patent. Evaluation of the pulmonary arteries is limited due to respiratory motion artifact and consolidative changes of the left lung. No large or central pulmonary artery embolus identified. Mediastinum/Nodes: No hilar adenopathy. No mediastinal fluid collection. The esophagus is poorly visualized. Lungs/Pleura: Large left pleural effusion with consolidative changes of the majority of the left lower lobe and partial consolidation of the lingula. Findings may represent compressive atelectasis or pneumonia. Clinical correlation and follow-up to resolution recommended. There is background of emphysema. The central airways are  patent. No pneumothorax. Upper Abdomen: Fatty liver and morphologic changes of cirrhosis. Partially visualized small ascites. Indeterminate 3 x 4 cm enhancing lesion in the right lobe of the liver. This was better evaluated on the MRI of 12/30/2020. Ill-defined 3 x 4 cm hypoenhancing area in the left lobe of the liver is not characterized. If there is history of trauma, a focal liver laceration is not excluded. Musculoskeletal: No acute osseous pathology. Review of the MIP images confirms the above findings. IMPRESSION: 1. No CT evidence of central pulmonary artery embolus. 2. Large left pleural effusion with compressive atelectasis of the majority of the left lower lobe and partial consolidation of the lingula. Pneumonia is not excluded. Clinical correlation and follow-up to resolution recommended. 3. Fatty liver and cirrhosis. Indeterminate enhancing lesion in the right lobe of the liver as seen on the MRI of 12/30/2020. Ill-defined hypoenhancing ar117ea in the left lobe of the liver is not characterized. A focal liver laceration is not excluded. 4. Partially visualized small ascites. 5. Aortic Atherosclerosis (ICD10-I70.0) and Emphysema (ICD10-J43.9). Electronically Signed   By: Anner Crete M.D.   On: 09/30/2021 00:39   DG Chest Left Decubitus  Result Date:  09/30/2021 CLINICAL DATA:  Dyspnea.  Ascites. EXAM: CHEST - LEFT DECUBITUS COMPARISON:  09/29/2021 FINDINGS: Left lateral decubitus image demonstrates a layering moderate sized left pleural effusion. Right lung is clear. Surgical clips overlying the right chest. Limited evaluation of the heart and mediastinum. Negative for a pneumothorax. IMPRESSION: Layering left pleural effusion. Electronically Signed   By: Markus Daft M.D.   On: 09/30/2021 10:18   US Venous Img Lower Bilateral (DVT)  Result Date: 09/30/2021 CLINICAL DATA:  Bilateral lower extremity swelling. EXAM: Bilateral LOWER EXTREMITY VENOUS DOPPLER ULTRASOUND TECHNIQUE: Gray-scale sonography with compression, as well as color and duplex ultrasound, were performed to evaluate the deep venous system(s) from the level of the common femoral vein through the popliteal and proximal calf veins. COMPARISON:  None. FINDINGS: VENOUS Normal compressibility of the common femoral, superficial femoral, and popliteal veins, as well as the visualized calf veins. Visualized portions of profunda femoral vein and great saphenous vein unremarkable. No filling defects to suggest DVT on grayscale or color Doppler imaging. Doppler waveforms show normal direction of venous flow, normal respiratory plasticity and response to augmentation. Limited views of the contralateral common femoral vein are unremarkable. OTHER None. Limitations: none IMPRESSION: Negative. Electronically Signed   By: Anner Crete M.D.   On: 09/30/2021 00:20    EKG: Independently reviewed.  Vent. rate 90 BPM PR interval 125 ms QRS duration 87 ms QT/QTcB 414/507 ms P-R-T axes 62 64 59 Sinus rhythm Abnormal R-wave progression, early transition Borderline prolonged QT interval  Assessment/Plan Principal Problem:   Pleural effusion Observation/telemetry. Continue supplemental oxygen. Analgesics as needed. IR to perform thoracentesis. Follow-up fluid analysis.  Active Problems    Hypokalemia Replacing. Magnesium was supplemented. Follow-up potassium level.    Prolonged QT interval Correct electrolyte abnormalities. Avoid QT prolonging meds. Continue cardiac telemetry overnight.    GERD Continue pantoprazole 40 mg p.o. daily.    Thrombocytopenia (HCC) Monitor platelet count.    Iron deficiency anemia due to chronic blood loss Monitor hematocrit and hemoglobin. Continue iron supplementation.    DVT prophylaxis: SCDs. Code Status:   Full code. Family Communication:   Disposition Plan:   Patient is from:  Home.  Anticipated DC to:  Home.  Anticipated DC date:  10/01/2021.  Anticipated DC barriers: Clinical status. Consults called:   Admission status:  Observation/telemetry.   Severity of Illness: High severity after presenting with history of pleuritic chest pain, progressively worse dyspnea in the setting of a large left pleural effusion causing significant atelectasis of the left lower lobe.  The patient has being admitted for IR guided thoracentesis.  Reubin Milan MD [facility]  How to contact the [facility] Attending or Consulting provider [place] or covering provider during after hours [place], for this patient?   Check the care team in [facility] and look for a) attending/consulting [facility] provider listed and b) the [place] team listed Log into [email or web address] and use [place]'s universal password to access. If you do not have the password, please contact the hospital operator. Locate the [facility] provider you are looking for under [facility] and page to a number that you can be directly reached. If you still have difficulty reaching the provider, please page the [facility] (Director on Call) for the Hospitalists listed on amion for assistance.  09/30/2021, 4:20 PM   This document was prepared using Dragon voice recognition software and may contain some unintended transcription errors.

## 2021-09-30 NOTE — ED Notes (Signed)
Pt ambulated to restroom w/ stand-by assist w/o oxygen.  NAD while ambulating.  Upon return to room, O2 sat read 89%.  Pt placed back on 2L [place].

## 2021-10-01 DIAGNOSIS — J90 Pleural effusion, not elsewhere classified: Secondary | ICD-10-CM | POA: Diagnosis not present

## 2021-10-01 DIAGNOSIS — C220 Liver cell carcinoma: Secondary | ICD-10-CM | POA: Diagnosis not present

## 2021-10-01 DIAGNOSIS — J910 Malignant pleural effusion: Secondary | ICD-10-CM | POA: Diagnosis not present

## 2021-10-01 LAB — CBC
HCT: 27.7 % — ABNORMAL LOW (ref 36.0–46.0)
Hemoglobin: 8 g/dL — ABNORMAL LOW (ref 12.0–15.0)
MCH: 22.6 pg — ABNORMAL LOW (ref 26.0–34.0)
MCHC: 28.9 g/dL — ABNORMAL LOW (ref 30.0–36.0)
MCV: 78.2 fL — ABNORMAL LOW (ref 80.0–100.0)
Platelets: 97 10*3/uL — ABNORMAL LOW (ref 150–400)
RBC: 3.54 MIL/uL — ABNORMAL LOW (ref 3.87–5.11)
RDW: 24.9 % — ABNORMAL HIGH (ref 11.5–15.5)
WBC: 6.3 10*3/uL (ref 4.0–10.5)
nRBC: 0 % (ref 0.0–0.2)

## 2021-10-01 LAB — BASIC METABOLIC PANEL
Anion gap: 6 (ref 5–15)
BUN: 7 mg/dL (ref 6–20)
CO2: 21 mmol/L — ABNORMAL LOW (ref 22–32)
Calcium: 7.3 mg/dL — ABNORMAL LOW (ref 8.9–10.3)
Chloride: 108 mmol/L (ref 98–111)
Creatinine, Ser: 0.43 mg/dL — ABNORMAL LOW (ref 0.44–1.00)
GFR, Estimated: >60 mL/min (ref >60)
Glucose, Bld: 101 mg/dL — ABNORMAL HIGH (ref 70–99)
Potassium: 4.4 mmol/L (ref 3.5–5.1)
Sodium: 135 mmol/L (ref 135–145)

## 2021-10-01 LAB — MAGNESIUM: Magnesium: 2.2 mg/dL (ref 1.7–2.4)

## 2021-10-01 LAB — HIV ANTIBODY (ROUTINE TESTING W REFLEX): HIV Screen 4th Generation wRfx: NONREACTIVE

## 2021-10-01 LAB — PHOSPHORUS: Phosphorus: 3.5 mg/dL (ref 2.5–4.6)

## 2021-10-01 NOTE — Progress Notes (Signed)
Referring Physician(s): Ortiz,D/Feng,Y  Supervising Physician: Sandi Mariscal  Patient Status:  [facility] - In-pt  Chief Complaint: Dyspnea, left shoulder pain, left pleural effusion, recent thermal ablation of hepatocellular carcinoma   Subjective: Patient resting quietly in bed, spouse in room; states she has ambulated down the hallway today with improvement in breathing since thoracentesis yesterday; does have occasional cough ; did have some left shoulder discomfort last night which was relieved with narcotic, likely diaphragmatic irritation from recent hepatic ablation; has eaten breakfast ;currently denies fever, headache, chest pain, worsening dyspnea, abdominal/back pain, nausea, vomiting or bleeding.   Allergies: Hydromorphone hcl and Aspirin  Medications: Prior to Admission medications   Medication Sig Start Date End Date Taking? Authorizing Provider  dicyclomine (BENTYL) 20 MG tablet Take one tablet every 4-6 hours as needed for abdominal cramping Patient taking differently: Take 20 mg by mouth See admin instructions. 20mg  every 4 to 6 hours as needed for abdominal cramping 11/21/19  Yes Irene Shipper, MD  ibuprofen (ADVIL) 200 MG tablet Take 400 mg by mouth every 6 (six) hours as needed for mild pain.   Yes [provider]  loperamide (IMODIUM) 2 MG capsule Take 2 mg by mouth daily as needed for diarrhea or loose stools.    Yes [provider]  nicotine (NICODERM CQ - DOSED IN MG/24 HOURS) 21 mg/24hr patch Place 21 mg onto the skin daily.   Yes [provider]  pantoprazole (PROTONIX) 40 MG tablet Take 1 tablet (40 mg total) by mouth daily. Office visit for future refills Patient taking differently: Take 40 mg by mouth daily. 06/16/21  Yes Irene Shipper, MD  promethazine (PHENERGAN) 25 MG tablet Take 1 tablet (25 mg total) by mouth every 8 (eight) hours as needed for nausea or vomiting. 05/25/21  Yes Edrick Kins, DPM  tamoxifen (NOLVADEX) 20 MG tablet  TAKE 1 TABLET BY MOUTH EVERY DAY Patient taking differently: Take 20 mg by mouth daily. 10/13/20  Yes Truitt Merle, MD  cephALEXin (KEFLEX) 500 MG capsule Take 1 capsule (500 mg total) by mouth 3 (three) times daily. Patient not taking: No sig reported 05/20/21   Trula Slade, DPM  KLOR-CON M20 20 MEQ tablet TAKE 1 TABLET BY MOUTH EVERY DAY Patient not taking: No sig reported 03/10/20   Irene Shipper, MD  rivaroxaban (XARELTO) 10 MG TABS tablet Take 1 tablet (10 mg total) by mouth daily. Patient not taking: No sig reported 05/20/21   Trula Slade, DPM     Vital Signs: BP 102/69 (BP Location: Right Arm)   Pulse 81   Temp 98 F (36.7 C) (Oral)   Resp 16   Ht 5\' 3"  (1.6 m)   Wt 110 lb 0.2 oz (49.9 kg)   LMP 04/20/2013   SpO2 99%   BMI 19.49 kg/m   Physical Exam awake, alert.  Puncture sites from recent procedures clean and dry, not significantly tender.  Imaging: DG Chest 1 View  Result Date: 09/30/2021 CLINICAL DATA:  Status post left thoracentesis. EXAM: CHEST  1 VIEW COMPARISON:  Chest CT earlier today.  Radiograph yesterday. FINDINGS: Decreased left pleural effusion after thoracentesis. There is no visualized pneumothorax. Streaky atelectasis at the left lung base. The heart is normal in size. The right lung is clear. No other interval change. IMPRESSION: Decreased left pleural effusion after thoracentesis. No visualized pneumothorax. Streaky atelectasis at the left lung base. Electronically Signed   By: Keith Rake M.D.   On: 09/30/2021  17:33   DG Chest 2 View  Result Date: 09/30/2021 CLINICAL DATA:  Shortness of breath, cirrhosis, recent liver ablation for [place]. EXAM: CHEST - 2 VIEW COMPARISON:  05/11/2013 FINDINGS: Interval development of a new moderately large left effusion with lingula and left lower lobe collapse/consolidation. No pneumothorax. Right lung remains clear. Normal heart size and vascularity. Trachea midline. Postop changes of the right breast and axilla.  No acute osseous finding. IMPRESSION: New moderately large left effusion with associated lingula and left lower lobe collapse/consolidation. Electronically Signed   By: Jerilynn Mages.  Shick M.D.   On: 09/30/2021 08:40   CT Angio Chest PE W and/or Wo Contrast  Result Date: 09/30/2021 CLINICAL DATA:  Concern for pulmonary embolism. EXAM: CT ANGIOGRAPHY CHEST WITH CONTRAST TECHNIQUE: Multidetector CT imaging of the chest was performed using the standard protocol during bolus administration of intravenous contrast. Multiplanar CT image reconstructions and MIPs were obtained to evaluate the vascular anatomy. CONTRAST:  54mL OMNIPAQUE IOHEXOL 350 MG/ML SOLN COMPARISON:  Chest radiograph dated 09/29/2021. FINDINGS: Cardiovascular: There is no cardiomegaly or pericardial effusion. The thoracic aorta is unremarkable. The origins of the great vessels of the aortic arch appear patent. Evaluation of the pulmonary arteries is limited due to respiratory motion artifact and consolidative changes of the left lung. No large or central pulmonary artery embolus identified. Mediastinum/Nodes: No hilar adenopathy. No mediastinal fluid collection. The esophagus is poorly visualized. Lungs/Pleura: Large left pleural effusion with consolidative changes of the majority of the left lower lobe and partial consolidation of the lingula. Findings may represent compressive atelectasis or pneumonia. Clinical correlation and follow-up to resolution recommended. There is background of emphysema. The central airways are patent. No pneumothorax. Upper Abdomen: Fatty liver and morphologic changes of cirrhosis. Partially visualized small ascites. Indeterminate 3 x 4 cm enhancing lesion in the right lobe of the liver. This was better evaluated on the MRI of 12/30/2020. Ill-defined 3 x 4 cm hypoenhancing area in the left lobe of the liver is not characterized. If there is history of trauma, a focal liver laceration is not excluded. Musculoskeletal: No acute  osseous pathology. Review of the MIP images confirms the above findings. IMPRESSION: 1. No CT evidence of central pulmonary artery embolus. 2. Large left pleural effusion with compressive atelectasis of the majority of the left lower lobe and partial consolidation of the lingula. Pneumonia is not excluded. Clinical correlation and follow-up to resolution recommended. 3. Fatty liver and cirrhosis. Indeterminate enhancing lesion in the right lobe of the liver as seen on the MRI of 12/30/2020. Ill-defined hypoenhancing ar117ea in the left lobe of the liver is not characterized. A focal liver laceration is not excluded. 4. Partially visualized small ascites. 5. Aortic Atherosclerosis (ICD10-I70.0) and Emphysema (ICD10-J43.9). Electronically Signed   By: Anner Crete M.D.   On: 09/30/2021 00:39   DG Chest Left Decubitus  Result Date: 09/30/2021 CLINICAL DATA:  Dyspnea.  Ascites. EXAM: CHEST - LEFT DECUBITUS COMPARISON:  09/29/2021 FINDINGS: Left lateral decubitus image demonstrates a layering moderate sized left pleural effusion. Right lung is clear. Surgical clips overlying the right chest. Limited evaluation of the heart and mediastinum. Negative for a pneumothorax. IMPRESSION: Layering left pleural effusion. Electronically Signed   By: Markus Daft M.D.   On: 09/30/2021 10:18   US Venous Img Lower Bilateral (DVT)  Result Date: 09/30/2021 CLINICAL DATA:  Bilateral lower extremity swelling. EXAM: Bilateral LOWER EXTREMITY VENOUS DOPPLER ULTRASOUND TECHNIQUE: Gray-scale sonography with compression, as well as color and duplex ultrasound, were performed to evaluate  the deep venous system(s) from the level of the common femoral vein through the popliteal and proximal calf veins. COMPARISON:  None. FINDINGS: VENOUS Normal compressibility of the common femoral, superficial femoral, and popliteal veins, as well as the visualized calf veins. Visualized portions of profunda femoral vein and great saphenous vein  unremarkable. No filling defects to suggest DVT on grayscale or color Doppler imaging. Doppler waveforms show normal direction of venous flow, normal respiratory plasticity and response to augmentation. Limited views of the contralateral common femoral vein are unremarkable. OTHER None. Limitations: none IMPRESSION: Negative. Electronically Signed   By: Anner Crete M.D.   On: 09/30/2021 00:20   US THORACENTESIS ASP PLEURAL SPACE W/IMG GUIDE  Result Date: 10/01/2021 INDICATION: Patient with history of cirrhosis, hepatitis-C, remote breast cancer, status post microwave thermal ablation of left lobe hepatocellular carcinoma on 09/16/2021; now with dyspnea and left pleural effusion. Request received for diagnostic and therapeutic left thoracentesis. EXAM: ULTRASOUND GUIDED DIAGNOSTIC AND THERAPEUTIC LEFT THORACENTESIS MEDICATIONS: 10 mL 1% lidocaine COMPLICATIONS: None immediate. PROCEDURE: An ultrasound guided thoracentesis was thoroughly discussed with the patient and questions answered. The benefits, risks, alternatives and complications were also discussed. The patient understands and wishes to proceed with the procedure. Written consent was obtained. Ultrasound was performed to localize and mark an adequate pocket of fluid in the left chest. The area was then prepped and draped in the normal sterile fashion. 1% Lidocaine was used for local anesthesia. Under ultrasound guidance a 6 Fr Safe-T-Centesis catheter was introduced. Thoracentesis was performed. The catheter was removed and a dressing applied. FINDINGS: A total of approximately 1.2 liters of yellow fluid was removed. Samples were sent to the laboratory as requested by the clinical team. IMPRESSION: Successful ultrasound guided diagnostic and therapeutic left thoracentesis yielding 1.2 liters of pleural fluid. Read by: Rowe Robert, PA-C Electronically Signed   By: Ruthann Cancer M.D.   On: 10/01/2021 07:39    Labs:  CBC: Recent Labs     09/01/21 1316 09/16/21 0730 09/29/21 2250 10/01/21 0343  WBC 3.9* 3.7* 5.2 6.3  HGB 8.7* 8.8* 8.7* 8.0*  HCT 30.8* 30.2* 29.7* 27.7*  PLT 72* 55* 108* 97*    COAGS: Recent Labs    09/16/21 0730  INR 1.6*    BMP: Recent Labs    09/01/21 1316 09/16/21 0730 09/29/21 2250 10/01/21 0343  NA 143 139 134* 135  K 3.9 3.6 3.3* 4.4  CL 111 108 103 108  CO2 25 26 24  21*  GLUCOSE 122* 97 87 101*  BUN 6 <5* <5* 7  CALCIUM 8.3* 8.1* 7.7* 7.3*  CREATININE 0.58 0.51 0.53 0.43*  GFRNONAA >60 >60 >60 >60    LIVER FUNCTION TESTS: Recent Labs    10/02/20 0954 01/02/21 0910 05/01/21 1113 09/16/21 0730  BILITOT 0.9 0.5 1.3* 1.1  AST 60* 43* 76* 79*  ALT 28 23 31  36  ALKPHOS 89 77 82 87  PROT 7.8 6.7 7.5 6.9  ALBUMIN 3.8 3.4* 3.5 3.1*    Assessment and Plan:  Patient with history of cirrhosis, hepatitis C and hepatocellular carcinoma of the left lobe ,status post CT-guided thermal ablation on 09/16/21; status post left thoracentesis yesterday for likely sympathetic effusion from recent ablation procedure, no ptx post procedure; currently afebrile, WBC normal, hemoglobin 8 down slightly from 8.7, plts 97k, creatinine .43, pleural fluid cytology pending; patient seen by Dr. Kathlene Cote yesterday during thoracentesis, all prev imaging reviewed; we will plan follow-up in IR clinic and call patient with date and  time.  Additional plans as per [facility].  Electronically Signed: D. Rowe Robert, PA-C 10/01/2021, 10:12 AM   I spent a total of 15 Minutes at the the patient's bedside AND on the patient's hospital floor or unit, greater than 50% of which was counseling/coordinating care for recent thermal ablation of left lobe hepatocellular carcinoma    Patient ID: Theresa Rocha, female   DOB: 07-12-1966, 55 y.o.   MRN: [id number]

## 2021-10-01 NOTE — Progress Notes (Signed)
Pt discharged to home in stable condition accompanied by husband.  AVS reviewed; pt and husband deny further questions or concerns at this time. Coolidge Breeze, RN 10/01/2021

## 2021-10-01 NOTE — Discharge Summary (Signed)
Dischage Note    Theresa Rocha  LGX:[id number] DOB: 1966/05/03 DOA: 09/29/2021 PCP: Seward Carol, MD   Brief Narrative:  Theresa Rocha is a 55 y.o. 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  The patient recently underwent a microwave ablation of hepatic lesion.  She states that then she developed left lower chest wall pleuritic chest pain soon after procedure.  Patient noted to have a pleural effusion on imaging at intake likely secondary to recent microwave ablation, status post thoracentesis with marked improvement and otherwise stable and agreeable for discharge home.  Close follow-up with outpatient PCP oncology and radiation oncology as scheduled.    Assessment & Plan:   Pleural effusion likely secondary to recent microwave ablation   Hypokalemia, resolved   Prolonged QT interval GERD  Chronic iron deficiency anemia, thrombocytopenia  Consultants:  None  Procedures:  Thoracentesis, successful without complication  Antimicrobials:  None  Subjective: No acute issues or events overnight  Objective: Vitals:   09/30/21 2233 10/01/21 0124 10/01/21 0232 10/01/21 0541  BP: 117/62  103/71 102/69  Pulse: (!) 104  89 81  Resp: (!) 26 14 19 18   Temp: 98.2 F (36.8 C)  98.3 F (36.8 C) 98 F (36.7 C)  TempSrc: Oral  Oral Oral  SpO2: 94%  97% 95%  Weight:      Height:        Intake/Output Summary (Last 24 hours) at 10/01/2021 0758 Last data filed at 09/30/2021 2218 Gross per 24 hour  Intake 465.53 ml  Output --  Net 465.53 ml   Filed Weights    09/29/21 2105  Weight: 49.9 kg    Examination:  General exam: Appears calm and comfortable  Respiratory system: Clear to auscultation. Respiratory effort normal. Cardiovascular system: S1 & S2 heard, RRR. No JVD, murmurs, rubs, gallops or clicks. No pedal edema. Gastrointestinal system: Abdomen is nondistended, soft and nontender. No organomegaly or masses felt. Normal bowel sounds heard. Central nervous system: Alert and oriented. No focal neurological deficits. Extremities: Symmetric 5 x 5 power. Skin: No rashes, lesions or ulcers Psychiatry: Judgement and insight appear normal. Mood & affect appropriate.     Data Reviewed: I have personally reviewed following labs and imaging studies  CBC: Recent Labs  Lab 09/29/21 2250 10/01/21 0343  WBC 5.2 6.3  NEUTROABS 3.3  --   HGB 8.7* 8.0*  HCT 29.7* 27.7*  MCV 77.1* 78.2*  PLT 108* 97*   Basic Metabolic Panel: Recent Labs  Lab 09/29/21 2250 10/01/21 0343  NA 134* 135  K 3.3* 4.4  CL 103 108  CO2 24 21*  GLUCOSE 87 101*  BUN <5* 7  CREATININE 0.53 0.43*  CALCIUM 7.7* 7.3*  MG  --  2.2  PHOS  --  3.5   GFR: Estimated Creatinine Clearance: 63.3 mL/min (A) (by C-G formula based on SCr of 0.43 mg/dL (L)). Liver Function Tests: No results for input(s): AST, ALT, ALKPHOS, BILITOT, PROT, ALBUMIN in the last 168 hours. No results for input(s): LIPASE, AMYLASE in the last 168 hours. No results for input(s): AMMONIA in the last 168 hours. Coagulation Profile: No  results for input(s): INR, PROTIME in the last 168 hours. Cardiac Enzymes: No results for input(s): CKTOTAL, CKMB, CKMBINDEX, TROPONINI in the last 168 hours. BNP (last 3 results) No results for input(s): PROBNP in the last 8760 hours. HbA1C: No results for input(s): HGBA1C in the last 72 hours. CBG: No results for input(s): GLUCAP in the last 168 hours. Lipid Profile: No results for input(s): CHOL, HDL, LDLCALC, TRIG, CHOLHDL, LDLDIRECT in the last 72  hours. Thyroid Function Tests: No results for input(s): TSH, T4TOTAL, FREET4, T3FREE, THYROIDAB in the last 72 hours. Anemia Panel: No results for input(s): VITAMINB12, FOLATE, FERRITIN, TIBC, IRON, RETICCTPCT in the last 72 hours. Sepsis Labs: No results for input(s): PROCALCITON, LATICACIDVEN in the last 168 hours.  Recent Results (from the past 240 hour(s))  Resp Panel by RT-PCR (Flu A&B, Covid) Nasopharyngeal Swab     Status: None   Collection Time: 09/30/21  3:36 AM   Specimen: Nasopharyngeal Swab; Nasopharyngeal(NP) swabs in vial transport medium  Result Value Ref Range Status   SARS Coronavirus 2 by RT PCR NEGATIVE NEGATIVE Final    Comment: (NOTE) SARS-CoV-2 target nucleic acids are NOT DETECTED.  The SARS-CoV-2 RNA is generally detectable in upper respiratory specimens during the acute phase of infection. The lowest concentration of SARS-CoV-2 viral copies this assay can detect is 138 copies/mL. A negative result does not preclude SARS-Cov-2 infection and should not be used as the sole basis for treatment or other patient management decisions. A negative result may occur with  improper specimen collection/handling, submission of specimen other than nasopharyngeal swab, presence of viral mutation(s) within the areas targeted by this assay, and inadequate number of viral copies(<138 copies/mL). A negative result must be combined with clinical observations, patient history, and epidemiological information. The expected result is Negative.  Fact Sheet for Patients:  EntrepreneurPulse.com.au  Fact Sheet for Healthcare Providers:  IncredibleEmployment.be  This test is no t yet approved or cleared by the [place] FDA and  has been authorized for detection and/or diagnosis of SARS-CoV-2 by FDA under an Emergency Use Authorization (EUA). This EUA will remain  in effect (meaning this test can be used) for the duration of the COVID-19  declaration under Section 564(b)(1) of the Act, 21 U.S.C.section 360bbb-3(b)(1), unless the authorization is terminated  or revoked sooner.       Influenza A by PCR NEGATIVE NEGATIVE Final   Influenza B by PCR NEGATIVE NEGATIVE Final    Comment: (NOTE) The Xpert Xpress SARS-CoV-2/FLU/RSV plus assay is intended as an aid in the diagnosis of influenza from Nasopharyngeal swab specimens and should not be used as a sole basis for treatment. Nasal washings and aspirates are unacceptable for Xpert Xpress SARS-CoV-2/FLU/RSV testing.  Fact Sheet for Patients: EntrepreneurPulse.com.au  Fact Sheet for Healthcare Providers: IncredibleEmployment.be  This test is not yet approved or cleared by the [place] FDA and has been authorized for detection and/or diagnosis of SARS-CoV-2 by FDA under an Emergency Use Authorization (EUA). This EUA will remain in effect (meaning this test can be used) for the duration of the COVID-19 declaration under Section 564(b)(1) of the Act, 21 U.S.C. section 360bbb-3(b)(1), unless the authorization is terminated or revoked.  Performed at [facility], [address] [place], [place] [address]          Radiology Studies: DG Chest 1 View  Result Date: 09/30/2021 CLINICAL DATA:  Status post left thoracentesis. EXAM: CHEST  1 VIEW COMPARISON:  Chest CT earlier today.  Radiograph yesterday.  FINDINGS: Decreased left pleural effusion after thoracentesis. There is no visualized pneumothorax. Streaky atelectasis at the left lung base. The heart is normal in size. The right lung is clear. No other interval change. IMPRESSION: Decreased left pleural effusion after thoracentesis. No visualized pneumothorax. Streaky atelectasis at the left lung base. Electronically Signed   By: Keith Rake M.D.   On: 09/30/2021 17:33   DG Chest 2 View  Result Date: 09/30/2021 CLINICAL DATA:  Shortness of breath, cirrhosis, recent  liver ablation for [place]. EXAM: CHEST - 2 VIEW COMPARISON:  05/11/2013 FINDINGS: Interval development of a new moderately large left effusion with lingula and left lower lobe collapse/consolidation. No pneumothorax. Right lung remains clear. Normal heart size and vascularity. Trachea midline. Postop changes of the right breast and axilla. No acute osseous finding. IMPRESSION: New moderately large left effusion with associated lingula and left lower lobe collapse/consolidation. Electronically Signed   By: Jerilynn Mages.  Shick M.D.   On: 09/30/2021 08:40   CT Angio Chest PE W and/or Wo Contrast  Result Date: 09/30/2021 CLINICAL DATA:  Concern for pulmonary embolism. EXAM: CT ANGIOGRAPHY CHEST WITH CONTRAST TECHNIQUE: Multidetector CT imaging of the chest was performed using the standard protocol during bolus administration of intravenous contrast. Multiplanar CT image reconstructions and MIPs were obtained to evaluate the vascular anatomy. CONTRAST:  86mL OMNIPAQUE IOHEXOL 350 MG/ML SOLN COMPARISON:  Chest radiograph dated 09/29/2021. FINDINGS: Cardiovascular: There is no cardiomegaly or pericardial effusion. The thoracic aorta is unremarkable. The origins of the great vessels of the aortic arch appear patent. Evaluation of the pulmonary arteries is limited due to respiratory motion artifact and consolidative changes of the left lung. No large or central pulmonary artery embolus identified. Mediastinum/Nodes: No hilar adenopathy. No mediastinal fluid collection. The esophagus is poorly visualized. Lungs/Pleura: Large left pleural effusion with consolidative changes of the majority of the left lower lobe and partial consolidation of the lingula. Findings may represent compressive atelectasis or pneumonia. Clinical correlation and follow-up to resolution recommended. There is background of emphysema. The central airways are patent. No pneumothorax. Upper Abdomen: Fatty liver and morphologic changes of cirrhosis. Partially  visualized small ascites. Indeterminate 3 x 4 cm enhancing lesion in the right lobe of the liver. This was better evaluated on the MRI of 12/30/2020. Ill-defined 3 x 4 cm hypoenhancing area in the left lobe of the liver is not characterized. If there is history of trauma, a focal liver laceration is not excluded. Musculoskeletal: No acute osseous pathology. Review of the MIP images confirms the above findings. IMPRESSION: 1. No CT evidence of central pulmonary artery embolus. 2. Large left pleural effusion with compressive atelectasis of the majority of the left lower lobe and partial consolidation of the lingula. Pneumonia is not excluded. Clinical correlation and follow-up to resolution recommended. 3. Fatty liver and cirrhosis. Indeterminate enhancing lesion in the right lobe of the liver as seen on the MRI of 12/30/2020. Ill-defined hypoenhancing ar117ea in the left lobe of the liver is not characterized. A focal liver laceration is not excluded. 4. Partially visualized small ascites. 5. Aortic Atherosclerosis (ICD10-I70.0) and Emphysema (ICD10-J43.9). Electronically Signed   By: Anner Crete M.D.   On: 09/30/2021 00:39   DG Chest Left Decubitus  Result Date: 09/30/2021 CLINICAL DATA:  Dyspnea.  Ascites. EXAM: CHEST - LEFT DECUBITUS COMPARISON:  09/29/2021 FINDINGS: Left lateral decubitus image demonstrates a layering moderate sized left pleural effusion. Right lung is clear. Surgical clips overlying the right chest. Limited evaluation of the heart and mediastinum. Negative  for a pneumothorax. IMPRESSION: Layering left pleural effusion. Electronically Signed   By: Markus Daft M.D.   On: 09/30/2021 10:18   US Venous Img Lower Bilateral (DVT)  Result Date: 09/30/2021 CLINICAL DATA:  Bilateral lower extremity swelling. EXAM: Bilateral LOWER EXTREMITY VENOUS DOPPLER ULTRASOUND TECHNIQUE: Gray-scale sonography with compression, as well as color and duplex ultrasound, were performed to evaluate the deep  venous system(s) from the level of the common femoral vein through the popliteal and proximal calf veins. COMPARISON:  None. FINDINGS: VENOUS Normal compressibility of the common femoral, superficial femoral, and popliteal veins, as well as the visualized calf veins. Visualized portions of profunda femoral vein and great saphenous vein unremarkable. No filling defects to suggest DVT on grayscale or color Doppler imaging. Doppler waveforms show normal direction of venous flow, normal respiratory plasticity and response to augmentation. Limited views of the contralateral common femoral vein are unremarkable. OTHER None. Limitations: none IMPRESSION: Negative. Electronically Signed   By: Anner Crete M.D.   On: 09/30/2021 00:20   US THORACENTESIS ASP PLEURAL SPACE W/IMG GUIDE  Result Date: 10/01/2021 INDICATION: Patient with history of cirrhosis, hepatitis-C, remote breast cancer, status post microwave thermal ablation of left lobe hepatocellular carcinoma on 09/16/2021; now with dyspnea and left pleural effusion. Request received for diagnostic and therapeutic left thoracentesis. EXAM: ULTRASOUND GUIDED DIAGNOSTIC AND THERAPEUTIC LEFT THORACENTESIS MEDICATIONS: 10 mL 1% lidocaine COMPLICATIONS: None immediate. PROCEDURE: An ultrasound guided thoracentesis was thoroughly discussed with the patient and questions answered. The benefits, risks, alternatives and complications were also discussed. The patient understands and wishes to proceed with the procedure. Written consent was obtained. Ultrasound was performed to localize and mark an adequate pocket of fluid in the left chest. The area was then prepped and draped in the normal sterile fashion. 1% Lidocaine was used for local anesthesia. Under ultrasound guidance a 6 Fr Safe-T-Centesis catheter was introduced. Thoracentesis was performed. The catheter was removed and a dressing applied. FINDINGS: A total of approximately 1.2 liters of yellow fluid was removed.  Samples were sent to the laboratory as requested by the clinical team. IMPRESSION: Successful ultrasound guided diagnostic and therapeutic left thoracentesis yielding 1.2 liters of pleural fluid. Read by: Rowe Robert, PA-C Electronically Signed   By: Ruthann Cancer M.D.   On: 10/01/2021 07:39     Scheduled Meds:  nicotine  21 mg Transdermal Daily   pantoprazole  40 mg Oral Daily   potassium chloride  40 mEq Oral Once   tamoxifen  20 mg Oral Daily   Continuous Infusions:   LOS: 0 days    Time spent: 53min  Milton Sagona C Latajah Thuman, DO [facility]  If 7PM-7AM, please contact night-coverage [email or web address]  10/01/2021, 7:58 AM

## 2021-10-06 LAB — CYTOLOGY - NON PAP

## 2021-10-08 ENCOUNTER — Ambulatory Visit
Admission: RE | Admit: 2021-10-08 | Discharge: 2021-10-08 | Disposition: A | Discharge status: Home / Self Care | Payer: BC Managed Care – PPO | Source: Ambulatory Visit | Attending: Student | Admitting: Student

## 2021-10-08 ENCOUNTER — Other Ambulatory Visit: Payer: Self-pay

## 2021-10-08 DIAGNOSIS — C220 Liver cell carcinoma: Secondary | ICD-10-CM

## 2021-10-08 DIAGNOSIS — Z9889 Other specified postprocedural states: Secondary | ICD-10-CM | POA: Diagnosis not present

## 2021-10-08 HISTORY — PX: IR RADIOLOGIST EVAL & MGMT: IMG5224

## 2021-10-08 NOTE — Progress Notes (Signed)
Chief Complaint: Patient was consulted remotely today (TeleHealth) for follow-up after thermal ablation of a hepatocellular carcinoma.  History of Present Illness: Theresa Rocha is a 55 y.o. female with a history of chronic hepatitis C, cirrhosis and liver lesions.  She is status post microwave thermal ablation of a 1.8 cm lesion in the left lobe of the liver consistent with hepatocellular carcinoma by imaging on 09/16/2021.  She initially did well after overnight observation but presented with shortness of breath on 09/29/2021 with chest x-ray and CT demonstrating a moderately large left pleural effusion likely representing a sympathetic effusion after the procedure due to irritation of the left diaphragm.  There was a well-circumscribed perfusion defect in the left lobe of the liver at the site of ablation and a small amount of perihepatic fluid.  Also noted was a roughly 3 x 4 cm area of irregular arterial enhancement in the posterior right lobe of the liver.  She was admitted overnight after a left thoracentesis which yielded 1.2 L of serous fluid.  She was discharged from the hospital on 10/01/2021 and overall feels well with some mild shortness of breath.  She denies any abdominal pain or fever.  Past Medical History:  Diagnosis Date   Anemia    Breast cancer ([place]) 08/30/2019   Cancer ([place])    liver- just diagnosed, not started treatment yet   Cervicalgia    COPD (chronic obstructive pulmonary disease) ([place])    GERD (gastroesophageal reflux disease)    Headache    Hepatic cirrhosis ([facility])    Hepatitis C    resolved 2018   Hepatocellular carcinoma ([facility])    IBS (irritable bowel syndrome)    Lactose intolerance    Personal history of radiation therapy 11/2019   Right breast   Portal hypertension ([place])    Raynaud's syndrome    Thrombocytopenia ([place])     Past Surgical History:  Procedure Laterality Date   BREAST BIOPSY Right 09/2019   BREAST LUMPECTOMY Right 09/2019   BREAST  LUMPECTOMY WITH AXILLARY LYMPH NODE BIOPSY Right 10/16/2019   Procedure: RIGHT BREAST LUMPECTOMY WITH SENTINEL LYMPH NODE BIOPSY;  Surgeon: Stark Klein, MD;  Location: [facility];  Service: General;  Laterality: Right;   COLONOSCOPY     HAMMER TOE SURGERY Left 2012   pins placed in great toe, 2nd,3rd, 4th toes, all pins removed except great toe   IR PARACENTESIS  11/13/2019   IR RADIOLOGIST EVAL & MGMT  08/16/2019   IR RADIOLOGIST EVAL & MGMT  02/06/2020   IR RADIOLOGIST EVAL & MGMT  06/26/2020   IR RADIOLOGIST EVAL & MGMT  12/31/2020   IR RADIOLOGIST EVAL & MGMT  07/21/2021   PELVIC LAPAROSCOPY Bilateral 1992   RADIOLOGY WITH ANESTHESIA N/A 09/16/2021   Procedure: CT MICROWAVE ABLATION;  Surgeon: Aletta Edouard, MD;  Location: [facility];  Service: Radiology;  Laterality: N/A;   UPPER GASTROINTESTINAL ENDOSCOPY  2002   had esophageal dilitation    Allergies: Hydromorphone hcl and Aspirin  Medications: Prior to Admission medications   Medication Sig Start Date End Date Taking? Authorizing Provider  dicyclomine (BENTYL) 20 MG tablet Take one tablet every 4-6 hours as needed for abdominal cramping Patient taking differently: Take 20 mg by mouth See admin instructions. 20mg  every 4 to 6 hours as needed for abdominal cramping 11/21/19   Irene Shipper, MD  ibuprofen (ADVIL) 200 MG tablet Take 400 mg by mouth every 6 (six) hours as needed for mild pain.  [provider]  loperamide (IMODIUM) 2 MG capsule Take 2 mg by mouth daily as needed for diarrhea or loose stools.     [provider]  nicotine (NICODERM CQ - DOSED IN MG/24 HOURS) 21 mg/24hr patch Place 21 mg onto the skin daily.    [provider]  pantoprazole (PROTONIX) 40 MG tablet Take 1 tablet (40 mg total) by mouth daily. Office visit for future refills Patient taking differently: Take 40 mg by mouth daily. 06/16/21   Irene Shipper, MD  promethazine (PHENERGAN) 25 MG tablet Take 1 tablet (25 mg total) by mouth every  8 (eight) hours as needed for nausea or vomiting. 05/25/21   Edrick Kins, DPM  tamoxifen (NOLVADEX) 20 MG tablet TAKE 1 TABLET BY MOUTH EVERY DAY Patient taking differently: Take 20 mg by mouth daily. 10/13/20   Truitt Merle, MD     Family History  Problem Relation Age of Onset   Liver cancer Mother    Lung cancer Mother    Lung cancer Father    Liver cancer Maternal Grandmother    Colon cancer Neg Hx    Esophageal cancer Neg Hx    Stomach cancer Neg Hx    Rectal cancer Neg Hx     Social History   Socioeconomic History   Marital status: Married    Spouse name: Not on file   Number of children: Not on file   Years of education: Not on file   Highest education level: Not on file  Occupational History   Not on file  Tobacco Use   Smoking status: Every Day    Packs/day: 0.50    Years: 30.00    Pack years: 15.00    Types: Cigarettes    Start date: 11/29/1978   Smokeless tobacco: Never  Vaping Use   Vaping Use: Never used  Substance and Sexual Activity   Alcohol use: Yes    Alcohol/week: 6.0 standard drinks    Types: 6 Cans of beer per week    Comment: stopped 06/2019   Drug use: No   Sexual activity: Not Currently    Partners: Male  Other Topics Concern   Not on file  Social History Narrative   Not on file   Social Determinants of Health   Financial Resource Strain: Not on file  Food Insecurity: Not on file  Transportation Needs: Not on file  Physical Activity: Not on file  Stress: Not on file  Social Connections: Not on file    ECOG Status: 1 - Symptomatic but completely ambulatory  Review of Systems  Review of Systems: A 12 point ROS discussed and pertinent positives are indicated in the HPI above.  All other systems are negative.  Physical Exam No direct physical exam was performed (except for noted visual exam findings with Video Visits).   Vital Signs: LMP 04/20/2013   Imaging: DG Chest 1 View  Result Date: 09/30/2021 CLINICAL DATA:  Status post  left thoracentesis. EXAM: CHEST  1 VIEW COMPARISON:  Chest CT earlier today.  Radiograph yesterday. FINDINGS: Decreased left pleural effusion after thoracentesis. There is no visualized pneumothorax. Streaky atelectasis at the left lung base. The heart is normal in size. The right lung is clear. No other interval change. IMPRESSION: Decreased left pleural effusion after thoracentesis. No visualized pneumothorax. Streaky atelectasis at the left lung base. Electronically Signed   By: Keith Rake M.D.   On: 09/30/2021 17:33   DG Chest 2 View  Result Date: 09/30/2021  CLINICAL DATA:  Shortness of breath, cirrhosis, recent liver ablation for [place]. EXAM: CHEST - 2 VIEW COMPARISON:  05/11/2013 FINDINGS: Interval development of a new moderately large left effusion with lingula and left lower lobe collapse/consolidation. No pneumothorax. Right lung remains clear. Normal heart size and vascularity. Trachea midline. Postop changes of the right breast and axilla. No acute osseous finding. IMPRESSION: New moderately large left effusion with associated lingula and left lower lobe collapse/consolidation. Electronically Signed   By: Jerilynn Mages.  Shick M.D.   On: 09/30/2021 08:40   CT Angio Chest PE W and/or Wo Contrast  Result Date: 09/30/2021 CLINICAL DATA:  Concern for pulmonary embolism. EXAM: CT ANGIOGRAPHY CHEST WITH CONTRAST TECHNIQUE: Multidetector CT imaging of the chest was performed using the standard protocol during bolus administration of intravenous contrast. Multiplanar CT image reconstructions and MIPs were obtained to evaluate the vascular anatomy. CONTRAST:  38mL OMNIPAQUE IOHEXOL 350 MG/ML SOLN COMPARISON:  Chest radiograph dated 09/29/2021. FINDINGS: Cardiovascular: There is no cardiomegaly or pericardial effusion. The thoracic aorta is unremarkable. The origins of the great vessels of the aortic arch appear patent. Evaluation of the pulmonary arteries is limited due to respiratory motion artifact and  consolidative changes of the left lung. No large or central pulmonary artery embolus identified. Mediastinum/Nodes: No hilar adenopathy. No mediastinal fluid collection. The esophagus is poorly visualized. Lungs/Pleura: Large left pleural effusion with consolidative changes of the majority of the left lower lobe and partial consolidation of the lingula. Findings may represent compressive atelectasis or pneumonia. Clinical correlation and follow-up to resolution recommended. There is background of emphysema. The central airways are patent. No pneumothorax. Upper Abdomen: Fatty liver and morphologic changes of cirrhosis. Partially visualized small ascites. Indeterminate 3 x 4 cm enhancing lesion in the right lobe of the liver. This was better evaluated on the MRI of 12/30/2020. Ill-defined 3 x 4 cm hypoenhancing area in the left lobe of the liver is not characterized. If there is history of trauma, a focal liver laceration is not excluded. Musculoskeletal: No acute osseous pathology. Review of the MIP images confirms the above findings. IMPRESSION: 1. No CT evidence of central pulmonary artery embolus. 2. Large left pleural effusion with compressive atelectasis of the majority of the left lower lobe and partial consolidation of the lingula. Pneumonia is not excluded. Clinical correlation and follow-up to resolution recommended. 3. Fatty liver and cirrhosis. Indeterminate enhancing lesion in the right lobe of the liver as seen on the MRI of 12/30/2020. Ill-defined hypoenhancing ar117ea in the left lobe of the liver is not characterized. A focal liver laceration is not excluded. 4. Partially visualized small ascites. 5. Aortic Atherosclerosis (ICD10-I70.0) and Emphysema (ICD10-J43.9). Electronically Signed   By: Anner Crete M.D.   On: 09/30/2021 00:39   DG Chest Left Decubitus  Result Date: 09/30/2021 CLINICAL DATA:  Dyspnea.  Ascites. EXAM: CHEST - LEFT DECUBITUS COMPARISON:  09/29/2021 FINDINGS: Left lateral  decubitus image demonstrates a layering moderate sized left pleural effusion. Right lung is clear. Surgical clips overlying the right chest. Limited evaluation of the heart and mediastinum. Negative for a pneumothorax. IMPRESSION: Layering left pleural effusion. Electronically Signed   By: Markus Daft M.D.   On: 09/30/2021 10:18   US Venous Img Lower Bilateral (DVT)  Result Date: 09/30/2021 CLINICAL DATA:  Bilateral lower extremity swelling. EXAM: Bilateral LOWER EXTREMITY VENOUS DOPPLER ULTRASOUND TECHNIQUE: Gray-scale sonography with compression, as well as color and duplex ultrasound, were performed to evaluate the deep venous system(s) from the level of the common femoral  vein through the popliteal and proximal calf veins. COMPARISON:  None. FINDINGS: VENOUS Normal compressibility of the common femoral, superficial femoral, and popliteal veins, as well as the visualized calf veins. Visualized portions of profunda femoral vein and great saphenous vein unremarkable. No filling defects to suggest DVT on grayscale or color Doppler imaging. Doppler waveforms show normal direction of venous flow, normal respiratory plasticity and response to augmentation. Limited views of the contralateral common femoral vein are unremarkable. OTHER None. Limitations: none IMPRESSION: Negative. Electronically Signed   By: Anner Crete M.D.   On: 09/30/2021 00:20   CT GUIDE TISSUE ABLATION  Result Date: 09/16/2021 CLINICAL DATA:  Cirrhosis and hepatocellular carcinoma of the left lobe of the liver. EXAM: CT-GUIDED PERCUTANEOUS THERMAL ABLATION OF HEPATOCELLULAR CARCINOMA OF LIVER COMPARISON:  MRI of the abdomen on 07/17/2021 and contrast enhanced liver ultrasound on 08/07/2021 ANESTHESIA/SEDATION: Anesthesia:  General Medications: 2 g IV cefoxitin. The antibiotic was administered in an appropriate time interval prior to needle puncture of the skin. CONTRAST:  None. PROCEDURE: The procedure, risks, benefits, and alternatives  were explained to the patient. Questions regarding the procedure were encouraged and answered. The patient understands and consents to the procedure. A time-out was performed prior to initiating the procedure. The patient was placed under general anesthesia. Initial unenhanced CT was performed in a supine position to localize the liver. The gastric region was prepped with chlorhexidine in a sterile fashion, and a sterile drape was applied covering the operative field. A sterile gown and sterile gloves were used for the procedure. Under ultrasound guidance, a 15 cm length NeuWave PR percutaneous microwave ablation probe was advanced into the left lobe of the liver at the level of focal hepatocellular carcinoma. Probe positioning was confirmed by CT prior to ablation. Microwave thermal ablation was performed through the single probe. An 8 minute cycle of ablation was performed at 65 watts of power. The ablation was monitored under real-time ultrasound guidance. Percutaneous tract cautery was then performed through the probe as the probe was retracted to the surface of the liver under ultrasound guidance. COMPLICATIONS: None FINDINGS: The previously characterized focal mass in the left lobe of the liver is visible by unenhanced CT as a relatively hyperdense rounded area in a low-density liver measuring approximately 1.6 cm in diameter. By ultrasound the mass measures 1.8 cm in maximum diameter. Thermal ablation was accomplished through a single probe placed through the center of the mass and just deep to the mass in the left lobe of the liver from an oblique anterior approach. IMPRESSION: CT guided percutaneous microwave thermal ablation of left lobe hepatocellular carcinoma. Electronically Signed   By: Aletta Edouard M.D.   On: 09/16/2021 11:50   US THORACENTESIS ASP PLEURAL SPACE W/IMG GUIDE  Result Date: 10/01/2021 INDICATION: Patient with history of cirrhosis, hepatitis-C, remote breast cancer, status post  microwave thermal ablation of left lobe hepatocellular carcinoma on 09/16/2021; now with dyspnea and left pleural effusion. Request received for diagnostic and therapeutic left thoracentesis. EXAM: ULTRASOUND GUIDED DIAGNOSTIC AND THERAPEUTIC LEFT THORACENTESIS MEDICATIONS: 10 mL 1% lidocaine COMPLICATIONS: None immediate. PROCEDURE: An ultrasound guided thoracentesis was thoroughly discussed with the patient and questions answered. The benefits, risks, alternatives and complications were also discussed. The patient understands and wishes to proceed with the procedure. Written consent was obtained. Ultrasound was performed to localize and mark an adequate pocket of fluid in the left chest. The area was then prepped and draped in the normal sterile fashion. 1% Lidocaine was used for local  anesthesia. Under ultrasound guidance a 6 Fr Safe-T-Centesis catheter was introduced. Thoracentesis was performed. The catheter was removed and a dressing applied. FINDINGS: A total of approximately 1.2 liters of yellow fluid was removed. Samples were sent to the laboratory as requested by the clinical team. IMPRESSION: Successful ultrasound guided diagnostic and therapeutic left thoracentesis yielding 1.2 liters of pleural fluid. Read by: Rowe Robert, PA-C Electronically Signed   By: Ruthann Cancer M.D.   On: 10/01/2021 07:39    Labs:  CBC: Recent Labs    09/01/21 1316 09/16/21 0730 09/29/21 2250 10/01/21 0343  WBC 3.9* 3.7* 5.2 6.3  HGB 8.7* 8.8* 8.7* 8.0*  HCT 30.8* 30.2* 29.7* 27.7*  PLT 72* 55* 108* 97*    COAGS: Recent Labs    09/16/21 0730  INR 1.6*    BMP: Recent Labs    09/01/21 1316 09/16/21 0730 09/29/21 2250 10/01/21 0343  NA 143 139 134* 135  K 3.9 3.6 3.3* 4.4  CL 111 108 103 108  CO2 25 26 24  21*  GLUCOSE 122* 97 87 101*  BUN 6 <5* <5* 7  CALCIUM 8.3* 8.1* 7.7* 7.3*  CREATININE 0.58 0.51 0.53 0.43*  GFRNONAA >60 >60 >60 >60    LIVER FUNCTION TESTS: Recent Labs     01/02/21 0910 05/01/21 1113 09/16/21 0730  BILITOT 0.5 1.3* 1.1  AST 43* 76* 79*  ALT 23 31 36  ALKPHOS 77 82 87  PROT 6.7 7.5 6.9  ALBUMIN 3.4* 3.5 3.1*    TUMOR MARKERS: Recent Labs    12/16/20 1106  AFPTM 4.4    Assessment and Plan:  I spoke with Otila Kluver over the phone.  She is feeling better after thoracentesis to remove the sympathetic left pleural effusion that developed after ablation of the left lobe hepatocellular carcinoma.  The lesion and ablation defect were very close to the left hemidiaphragm, explaining the development of the pleural effusion.  She is still slightly short of breath and is scheduled to follow-up with Dr. Delfina Redwood tomorrow.  I recommended that she follow her symptoms and that a follow-up chest x-ray may be needed if she has worsening shortness of breath.  The area of arterial enhancement in the posterior right lobe of the liver in segment VII appears slightly more prominent by the recent CTA of the chest compared to the prior MRI of the abdomen in August and this area did not demonstrate significant hyperenhancement by contrast-enhanced ultrasound in September.  Given some concern that this may still represent an area of hepatocellular carcinoma, I recommended close follow-up and correlation with AFP level soon.  In order to follow this area as well as the recent ablation site, I recommended a multiphase CT of the liver with liver mass protocol in December.  It appears that the area of hyperenhancement is even better delineated by CT than MRI.  Based on follow-up CT, we could then make a determination about how to proceed with either potential biopsy or treatment of this potential enlarging lesion.   Electronically Signed: Azzie Roup 10/08/2021, 10:27 AM    I spent a total of  10 Minutes in remote  clinical consultation, greater than 50% of which was counseling/coordinating care for hepatocellular carcinoma.    Visit type: Audio only (telephone). Audio  (no video) only due to patient's lack of internet/smartphone capability. Alternative for in-person consultation at [facility], [address], [place], [place]. This visit type was conducted due to national recommendations for restrictions regarding the COVID-19 Pandemic (  e.g. social distancing).  This format is felt to be most appropriate for this patient at this time.  All issues noted in this document were discussed and addressed.

## 2021-10-09 ENCOUNTER — Ambulatory Visit
Admission: RE | Admit: 2021-10-09 | Discharge: 2021-10-09 | Disposition: A | Discharge status: Home / Self Care | Payer: BC Managed Care – PPO | Source: Ambulatory Visit | Attending: Internal Medicine | Admitting: Internal Medicine

## 2021-10-09 ENCOUNTER — Other Ambulatory Visit: Payer: Self-pay | Admitting: Internal Medicine

## 2021-10-09 DIAGNOSIS — J90 Pleural effusion, not elsewhere classified: Secondary | ICD-10-CM | POA: Diagnosis not present

## 2021-10-09 DIAGNOSIS — C220 Liver cell carcinoma: Secondary | ICD-10-CM | POA: Diagnosis not present

## 2021-10-09 DIAGNOSIS — D638 Anemia in other chronic diseases classified elsewhere: Secondary | ICD-10-CM | POA: Diagnosis not present

## 2021-10-09 DIAGNOSIS — K7460 Unspecified cirrhosis of liver: Secondary | ICD-10-CM | POA: Diagnosis not present

## 2021-10-09 DIAGNOSIS — J9811 Atelectasis: Secondary | ICD-10-CM | POA: Diagnosis not present

## 2021-10-12 ENCOUNTER — Ambulatory Visit (HOSPITAL_COMMUNITY)
Admission: RE | Admit: 2021-10-12 | Discharge: 2021-10-12 | Disposition: A | Discharge status: Home / Self Care | Payer: BC Managed Care – PPO | Source: Ambulatory Visit | Attending: Interventional Radiology | Admitting: Interventional Radiology

## 2021-10-12 ENCOUNTER — Encounter: Payer: BC Managed Care – PPO | Admitting: Podiatry

## 2021-10-12 ENCOUNTER — Other Ambulatory Visit: Payer: Self-pay

## 2021-10-12 ENCOUNTER — Other Ambulatory Visit (HOSPITAL_COMMUNITY): Payer: Self-pay | Admitting: Interventional Radiology

## 2021-10-12 DIAGNOSIS — J90 Pleural effusion, not elsewhere classified: Secondary | ICD-10-CM | POA: Diagnosis not present

## 2021-10-12 DIAGNOSIS — Z9889 Other specified postprocedural states: Secondary | ICD-10-CM

## 2021-10-12 DIAGNOSIS — J9811 Atelectasis: Secondary | ICD-10-CM | POA: Diagnosis not present

## 2021-10-12 HISTORY — PX: IR THORACENTESIS ASP PLEURAL SPACE W/IMG GUIDE: IMG5380

## 2021-10-12 MED ORDER — LIDOCAINE HCL 1 % IJ SOLN
INTRAMUSCULAR | Status: DC | PRN
  Administered 2021-10-12: 5 mL via INTRADERMAL

## 2021-10-12 MED ORDER — LIDOCAINE HCL 1 % IJ SOLN
INTRAMUSCULAR | Status: AC
  Filled 2021-10-12: qty 20

## 2021-10-12 NOTE — Procedures (Signed)
Interventional Radiology Procedure Note  Procedure: US guided left thoracentesis  Complications: None  Estimated Blood Loss: None  Findings: 1.8 L of yellow fluid removed from left pleural space. Post CXR pending.  [name]. Kathlene Cote, M.D Pager:  [phone]

## 2021-10-13 ENCOUNTER — Other Ambulatory Visit: Payer: Self-pay | Admitting: Interventional Radiology

## 2021-10-13 ENCOUNTER — Other Ambulatory Visit: Payer: Self-pay

## 2021-10-13 DIAGNOSIS — C220 Liver cell carcinoma: Secondary | ICD-10-CM

## 2021-10-26 ENCOUNTER — Ambulatory Visit (HOSPITAL_COMMUNITY)
Admission: RE | Admit: 2021-10-26 | Discharge: 2021-10-26 | Disposition: A | Discharge status: Home / Self Care | Payer: BC Managed Care – PPO | Source: Ambulatory Visit | Attending: Internal Medicine | Admitting: Internal Medicine

## 2021-10-26 ENCOUNTER — Other Ambulatory Visit: Payer: Self-pay

## 2021-10-26 ENCOUNTER — Other Ambulatory Visit: Payer: Self-pay | Admitting: Student

## 2021-10-26 ENCOUNTER — Emergency Department (HOSPITAL_COMMUNITY): Payer: BC Managed Care – PPO

## 2021-10-26 ENCOUNTER — Inpatient Hospital Stay (HOSPITAL_COMMUNITY)
Admission: EM | Admit: 2021-10-26 | Discharge: 2021-11-02 | DRG: 432 | Disposition: A | Discharge status: Home / Self Care | Payer: BC Managed Care – PPO | Attending: Internal Medicine | Admitting: Internal Medicine

## 2021-10-26 ENCOUNTER — Ambulatory Visit (HOSPITAL_COMMUNITY)
Admission: RE | Admit: 2021-10-26 | Discharge: 2021-10-26 | Disposition: A | Discharge status: Home / Self Care | Payer: BC Managed Care – PPO | Source: Ambulatory Visit | Attending: Student | Admitting: Student

## 2021-10-26 ENCOUNTER — Telehealth: Payer: Self-pay | Admitting: Internal Medicine

## 2021-10-26 ENCOUNTER — Other Ambulatory Visit (HOSPITAL_COMMUNITY): Payer: Self-pay | Admitting: Student

## 2021-10-26 DIAGNOSIS — R6510 Systemic inflammatory response syndrome (SIRS) of non-infectious origin without acute organ dysfunction: Secondary | ICD-10-CM | POA: Diagnosis not present

## 2021-10-26 DIAGNOSIS — K7030 Alcoholic cirrhosis of liver without ascites: Secondary | ICD-10-CM | POA: Diagnosis not present

## 2021-10-26 DIAGNOSIS — I272 Pulmonary hypertension, unspecified: Secondary | ICD-10-CM | POA: Diagnosis not present

## 2021-10-26 DIAGNOSIS — J449 Chronic obstructive pulmonary disease, unspecified: Secondary | ICD-10-CM | POA: Diagnosis not present

## 2021-10-26 DIAGNOSIS — K920 Hematemesis: Secondary | ICD-10-CM | POA: Diagnosis not present

## 2021-10-26 DIAGNOSIS — J918 Pleural effusion in other conditions classified elsewhere: Secondary | ICD-10-CM | POA: Diagnosis not present

## 2021-10-26 DIAGNOSIS — K922 Gastrointestinal hemorrhage, unspecified: Secondary | ICD-10-CM | POA: Diagnosis not present

## 2021-10-26 DIAGNOSIS — K921 Melena: Secondary | ICD-10-CM | POA: Diagnosis present

## 2021-10-26 DIAGNOSIS — D62 Acute posthemorrhagic anemia: Secondary | ICD-10-CM | POA: Diagnosis present

## 2021-10-26 DIAGNOSIS — C220 Liver cell carcinoma: Secondary | ICD-10-CM | POA: Diagnosis not present

## 2021-10-26 DIAGNOSIS — J939 Pneumothorax, unspecified: Secondary | ICD-10-CM | POA: Diagnosis not present

## 2021-10-26 DIAGNOSIS — D696 Thrombocytopenia, unspecified: Secondary | ICD-10-CM | POA: Diagnosis present

## 2021-10-26 DIAGNOSIS — I7300 Raynaud's syndrome without gangrene: Secondary | ICD-10-CM | POA: Diagnosis present

## 2021-10-26 DIAGNOSIS — U071 COVID-19: Secondary | ICD-10-CM | POA: Diagnosis not present

## 2021-10-26 DIAGNOSIS — B182 Chronic viral hepatitis C: Secondary | ICD-10-CM | POA: Diagnosis present

## 2021-10-26 DIAGNOSIS — R64 Cachexia: Secondary | ICD-10-CM | POA: Diagnosis present

## 2021-10-26 DIAGNOSIS — D684 Acquired coagulation factor deficiency: Secondary | ICD-10-CM | POA: Diagnosis present

## 2021-10-26 DIAGNOSIS — I8511 Secondary esophageal varices with bleeding: Secondary | ICD-10-CM | POA: Diagnosis present

## 2021-10-26 DIAGNOSIS — J9811 Atelectasis: Secondary | ICD-10-CM | POA: Diagnosis not present

## 2021-10-26 DIAGNOSIS — K7460 Unspecified cirrhosis of liver: Secondary | ICD-10-CM

## 2021-10-26 DIAGNOSIS — J90 Pleural effusion, not elsewhere classified: Secondary | ICD-10-CM

## 2021-10-26 DIAGNOSIS — F1011 Alcohol abuse, in remission: Secondary | ICD-10-CM | POA: Diagnosis not present

## 2021-10-26 DIAGNOSIS — Z923 Personal history of irradiation: Secondary | ICD-10-CM

## 2021-10-26 DIAGNOSIS — D509 Iron deficiency anemia, unspecified: Secondary | ICD-10-CM | POA: Diagnosis present

## 2021-10-26 DIAGNOSIS — E876 Hypokalemia: Secondary | ICD-10-CM | POA: Diagnosis present

## 2021-10-26 DIAGNOSIS — R059 Cough, unspecified: Secondary | ICD-10-CM

## 2021-10-26 DIAGNOSIS — Z170 Estrogen receptor positive status [ER+]: Secondary | ICD-10-CM

## 2021-10-26 DIAGNOSIS — Z9889 Other specified postprocedural states: Secondary | ICD-10-CM

## 2021-10-26 DIAGNOSIS — K828 Other specified diseases of gallbladder: Secondary | ICD-10-CM | POA: Diagnosis not present

## 2021-10-26 DIAGNOSIS — C249 Malignant neoplasm of biliary tract, unspecified: Secondary | ICD-10-CM

## 2021-10-26 DIAGNOSIS — R519 Headache, unspecified: Secondary | ICD-10-CM | POA: Diagnosis present

## 2021-10-26 DIAGNOSIS — R188 Other ascites: Secondary | ICD-10-CM | POA: Diagnosis not present

## 2021-10-26 DIAGNOSIS — R0602 Shortness of breath: Secondary | ICD-10-CM | POA: Diagnosis not present

## 2021-10-26 DIAGNOSIS — K7031 Alcoholic cirrhosis of liver with ascites: Principal | ICD-10-CM | POA: Diagnosis present

## 2021-10-26 DIAGNOSIS — K3189 Other diseases of stomach and duodenum: Secondary | ICD-10-CM | POA: Diagnosis present

## 2021-10-26 DIAGNOSIS — K219 Gastro-esophageal reflux disease without esophagitis: Secondary | ICD-10-CM | POA: Diagnosis present

## 2021-10-26 DIAGNOSIS — Z20822 Contact with and (suspected) exposure to covid-19: Secondary | ICD-10-CM | POA: Diagnosis present

## 2021-10-26 DIAGNOSIS — J9601 Acute respiratory failure with hypoxia: Secondary | ICD-10-CM | POA: Diagnosis not present

## 2021-10-26 DIAGNOSIS — F1021 Alcohol dependence, in remission: Secondary | ICD-10-CM | POA: Diagnosis not present

## 2021-10-26 DIAGNOSIS — K222 Esophageal obstruction: Secondary | ICD-10-CM | POA: Diagnosis present

## 2021-10-26 DIAGNOSIS — I959 Hypotension, unspecified: Secondary | ICD-10-CM | POA: Diagnosis present

## 2021-10-26 DIAGNOSIS — Z885 Allergy status to narcotic agent status: Secondary | ICD-10-CM

## 2021-10-26 DIAGNOSIS — I8500 Esophageal varices without bleeding: Secondary | ICD-10-CM | POA: Diagnosis present

## 2021-10-26 DIAGNOSIS — R000 Tachycardia, unspecified: Secondary | ICD-10-CM | POA: Diagnosis present

## 2021-10-26 DIAGNOSIS — K639 Disease of intestine, unspecified: Secondary | ICD-10-CM

## 2021-10-26 DIAGNOSIS — D6959 Other secondary thrombocytopenia: Secondary | ICD-10-CM | POA: Diagnosis not present

## 2021-10-26 DIAGNOSIS — K529 Noninfective gastroenteritis and colitis, unspecified: Secondary | ICD-10-CM | POA: Diagnosis present

## 2021-10-26 DIAGNOSIS — B1920 Unspecified viral hepatitis C without hepatic coma: Secondary | ICD-10-CM

## 2021-10-26 DIAGNOSIS — J948 Other specified pleural conditions: Secondary | ICD-10-CM | POA: Diagnosis not present

## 2021-10-26 DIAGNOSIS — K766 Portal hypertension: Secondary | ICD-10-CM | POA: Diagnosis not present

## 2021-10-26 DIAGNOSIS — F17210 Nicotine dependence, cigarettes, uncomplicated: Secondary | ICD-10-CM | POA: Diagnosis present

## 2021-10-26 DIAGNOSIS — Z853 Personal history of malignant neoplasm of breast: Secondary | ICD-10-CM

## 2021-10-26 DIAGNOSIS — K449 Diaphragmatic hernia without obstruction or gangrene: Secondary | ICD-10-CM | POA: Diagnosis present

## 2021-10-26 DIAGNOSIS — K7011 Alcoholic hepatitis with ascites: Secondary | ICD-10-CM | POA: Diagnosis not present

## 2021-10-26 DIAGNOSIS — K7689 Other specified diseases of liver: Secondary | ICD-10-CM | POA: Diagnosis not present

## 2021-10-26 DIAGNOSIS — I871 Compression of vein: Secondary | ICD-10-CM

## 2021-10-26 DIAGNOSIS — Z886 Allergy status to analgesic agent status: Secondary | ICD-10-CM

## 2021-10-26 DIAGNOSIS — C229 Malignant neoplasm of liver, not specified as primary or secondary: Secondary | ICD-10-CM | POA: Diagnosis not present

## 2021-10-26 DIAGNOSIS — Z681 Body mass index (BMI) 19 or less, adult: Secondary | ICD-10-CM | POA: Diagnosis not present

## 2021-10-26 DIAGNOSIS — C50111 Malignant neoplasm of central portion of right female breast: Secondary | ICD-10-CM

## 2021-10-26 DIAGNOSIS — E872 Acidosis, unspecified: Secondary | ICD-10-CM | POA: Diagnosis present

## 2021-10-26 DIAGNOSIS — Z800 Family history of malignant neoplasm of digestive organs: Secondary | ICD-10-CM

## 2021-10-26 DIAGNOSIS — I8510 Secondary esophageal varices without bleeding: Secondary | ICD-10-CM | POA: Diagnosis not present

## 2021-10-26 DIAGNOSIS — Z801 Family history of malignant neoplasm of trachea, bronchus and lung: Secondary | ICD-10-CM

## 2021-10-26 DIAGNOSIS — R109 Unspecified abdominal pain: Secondary | ICD-10-CM | POA: Diagnosis not present

## 2021-10-26 DIAGNOSIS — J439 Emphysema, unspecified: Secondary | ICD-10-CM | POA: Diagnosis not present

## 2021-10-26 DIAGNOSIS — R509 Fever, unspecified: Secondary | ICD-10-CM

## 2021-10-26 DIAGNOSIS — E8770 Fluid overload, unspecified: Secondary | ICD-10-CM | POA: Diagnosis present

## 2021-10-26 HISTORY — DX: Esophageal varices without bleeding: I85.00

## 2021-10-26 HISTORY — PX: IR THORACENTESIS ASP PLEURAL SPACE W/IMG GUIDE: IMG5380

## 2021-10-26 LAB — COMPREHENSIVE METABOLIC PANEL
ALT: 23 U/L (ref 0–44)
AST: 44 U/L — ABNORMAL HIGH (ref 15–41)
Albumin: 2.8 g/dL — ABNORMAL LOW (ref 3.5–5.0)
Alkaline Phosphatase: 56 U/L (ref 38–126)
Anion gap: 7 (ref 5–15)
BUN: 15 mg/dL (ref 6–20)
CO2: 22 mmol/L (ref 22–32)
Calcium: 7.8 mg/dL — ABNORMAL LOW (ref 8.9–10.3)
Chloride: 108 mmol/L (ref 98–111)
Creatinine, Ser: 0.53 mg/dL (ref 0.44–1.00)
GFR, Estimated: >60 mL/min (ref >60)
Glucose, Bld: 98 mg/dL (ref 70–99)
Potassium: 4.3 mmol/L (ref 3.5–5.1)
Sodium: 137 mmol/L (ref 135–145)
Total Bilirubin: 1.6 mg/dL — ABNORMAL HIGH (ref 0.3–1.2)
Total Protein: 6.6 g/dL (ref 6.5–8.1)

## 2021-10-26 LAB — CBC
HCT: 28.9 % — ABNORMAL LOW (ref 36.0–46.0)
Hemoglobin: 8.3 g/dL — ABNORMAL LOW (ref 12.0–15.0)
MCH: 22.7 pg — ABNORMAL LOW (ref 26.0–34.0)
MCHC: 28.7 g/dL — ABNORMAL LOW (ref 30.0–36.0)
MCV: 79 fL — ABNORMAL LOW (ref 80.0–100.0)
Platelets: 124 10*3/uL — ABNORMAL LOW (ref 150–400)
RBC: 3.66 MIL/uL — ABNORMAL LOW (ref 3.87–5.11)
RDW: 22.7 % — ABNORMAL HIGH (ref 11.5–15.5)
WBC: 9.6 10*3/uL (ref 4.0–10.5)
nRBC: 0 % (ref 0.0–0.2)

## 2021-10-26 LAB — PREPARE RBC (CROSSMATCH)

## 2021-10-26 LAB — RESP PANEL BY RT-PCR (FLU A&B, COVID) ARPGX2
Influenza A by PCR: NEGATIVE
Influenza B by PCR: NEGATIVE
SARS Coronavirus 2 by RT PCR: NEGATIVE

## 2021-10-26 LAB — POC OCCULT BLOOD, ED: Fecal Occult Bld: POSITIVE — AB

## 2021-10-26 LAB — PROTIME-INR
INR: 1.8 — ABNORMAL HIGH (ref 0.8–1.2)
Prothrombin Time: 21.2 seconds — ABNORMAL HIGH (ref 11.4–15.2)

## 2021-10-26 LAB — APTT: aPTT: 31 seconds (ref 24–36)

## 2021-10-26 LAB — LIPASE, BLOOD: Lipase: 38 U/L (ref 11–51)

## 2021-10-26 MED ORDER — IOHEXOL 350 MG/ML SOLN
80.0000 mL | Freq: Once | INTRAVENOUS | Status: AC | PRN
  Administered 2021-10-26: 20:00:00 80 mL via INTRAVENOUS

## 2021-10-26 MED ORDER — PANTOPRAZOLE 80MG IVPB - SIMPLE MED
80.0000 mg | Freq: Once | INTRAVENOUS | Status: AC
  Administered 2021-10-26: 80 mg via INTRAVENOUS
  Filled 2021-10-26: qty 80

## 2021-10-26 MED ORDER — SODIUM CHLORIDE 0.9 % IV BOLUS
1000.0000 mL | Freq: Once | INTRAVENOUS | Status: AC
  Administered 2021-10-26: 20:00:00 1000 mL via INTRAVENOUS

## 2021-10-26 MED ORDER — PANTOPRAZOLE SODIUM 40 MG IV SOLR: 40.0000 mg | Freq: Two times a day (BID) | INTRAVENOUS | Status: DC

## 2021-10-26 MED ORDER — PANTOPRAZOLE INFUSION (NEW) - SIMPLE MED
8.0000 mg/h | INTRAVENOUS | Status: DC
  Administered 2021-10-26 – 2021-10-27 (×2): 8 mg/h via INTRAVENOUS
  Filled 2021-10-26: qty 80
  Filled 2021-10-26: qty 100
  Filled 2021-10-26: qty 80
  Filled 2021-10-26 (×2): qty 100

## 2021-10-26 MED ORDER — SODIUM CHLORIDE 0.9 % IV BOLUS
1000.0000 mL | Freq: Once | INTRAVENOUS | Status: AC
  Administered 2021-10-26: 22:00:00 1000 mL via INTRAVENOUS

## 2021-10-26 MED ORDER — SODIUM CHLORIDE 0.9 % IV SOLN
50.0000 ug/h | INTRAVENOUS | Status: AC
  Administered 2021-10-26 – 2021-10-29 (×6): 50 ug/h via INTRAVENOUS
  Filled 2021-10-26 (×8): qty 1

## 2021-10-26 MED ORDER — VITAMIN K1 10 MG/ML IJ SOLN
5.0000 mg | Freq: Once | INTRAVENOUS | Status: AC
  Administered 2021-10-27: 5 mg via INTRAVENOUS
  Filled 2021-10-26: qty 0.5

## 2021-10-26 MED ORDER — LIDOCAINE HCL 1 % IJ SOLN
INTRAMUSCULAR | Status: AC
  Administered 2021-10-26: 14:00:00 10 mL
  Filled 2021-10-26: qty 20

## 2021-10-26 MED ORDER — PROCHLORPERAZINE EDISYLATE 10 MG/2ML IJ SOLN
5.0000 mg | Freq: Once | INTRAMUSCULAR | Status: AC
Start: 2021-10-26 — End: 2021-10-26
  Administered 2021-10-26: 22:00:00 5 mg via INTRAVENOUS
  Filled 2021-10-26: qty 2

## 2021-10-26 MED ORDER — SODIUM CHLORIDE 0.9 % IV SOLN
2.0000 g | INTRAVENOUS | Status: DC
  Administered 2021-10-27 – 2021-10-29 (×4): 2 g via INTRAVENOUS
  Filled 2021-10-26 (×4): qty 20

## 2021-10-26 MED ORDER — OCTREOTIDE LOAD VIA INFUSION
50.0000 ug | Freq: Once | INTRAVENOUS | Status: AC
  Administered 2021-10-26: 21:00:00 50 ug via INTRAVENOUS
  Filled 2021-10-26: qty 25

## 2021-10-26 MED ORDER — SODIUM CHLORIDE 0.9 % IV SOLN: INTRAVENOUS | Status: DC

## 2021-10-26 MED ORDER — MORPHINE SULFATE (PF) 2 MG/ML IV SOLN
2.0000 mg | Freq: Once | INTRAVENOUS | Status: AC
  Administered 2021-10-26: 19:00:00 2 mg via INTRAVENOUS
  Filled 2021-10-26: qty 1

## 2021-10-26 MED ORDER — SODIUM CHLORIDE 0.9 % IV SOLN: INTRAVENOUS | Status: AC

## 2021-10-26 MED ORDER — ONDANSETRON HCL 4 MG/2ML IJ SOLN
4.0000 mg | Freq: Once | INTRAMUSCULAR | Status: AC
  Administered 2021-10-26: 19:00:00 4 mg via INTRAVENOUS
  Filled 2021-10-26: qty 2

## 2021-10-26 MED ORDER — SODIUM CHLORIDE 0.9 % IV SOLN: 10.0000 mL/h | Freq: Once | INTRAVENOUS | Status: DC

## 2021-10-26 NOTE — Telephone Encounter (Signed)
The pt states she has had 2 episodes of vomiting blood.  She also states that she had a thoracentesis today and has to go back because "they nicked my lung"  I advised that she needs to go to the ED for evaluation.  She does agree. FYI Dr Henrene Pastor

## 2021-10-26 NOTE — Telephone Encounter (Signed)
Noted.  We are available if needed.  Thanks

## 2021-10-26 NOTE — Telephone Encounter (Signed)
Patient called and states that she is vomiting blood. She is supposed to see Theresa Rocha on Thursday. She is seeking advise if she needs to be seen sooner than that. Please advise.

## 2021-10-26 NOTE — Progress Notes (Signed)
Patient is s/p left thoracentesis this afternoon.  Post procedure CXR showed trace apical PTX.  Patient was informed above finding, recommended repeat CXR at 3:30 pm.   Patient states that she was directed to go to ED by her GI provider due to persistent SOB after vomiting bright red blood this morning.  Informed the patient that I will call her with the CXR report.   Repeat CXR reviewed by Dr. Maryelizabeth Kaufmann, no enlarging PTX.  Called and left VM regarding above finding.   Please call IR for further questions and concerns.   Armando Gang Laquashia Mergenthaler PA-C 10/26/2021 3:33 PM

## 2021-10-26 NOTE — H&P (Addendum)
History and Physical    Theresa Rocha:[id number] DOB: Apr 19, 1966 DOA: 10/26/2021  PCP: Seward Carol, MD  Patient coming from: Home.  Chief Complaint: Throwing up blood.  HPI: Theresa Rocha is a 55 y.o. 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  Patient started experiencing bloody vomitus at least 2 episodes and after coming to the ER had another episode.  Over the last 24 hours patient has been having black tarry stools.  Patient did take some NSAIDs about a month ago after her first thoracentesis.  Patient did develop a small pneumothorax after the procedure but subsequently chest x-rays in the ER did not show any pneumothorax.  ED Course: In the ER patient was hypotensive tachycardic stool for occult blood was positive labs show hemoglobin of 8.3 which is around the baseline platelets were 124.  INR 1.8 AST of 44.  On-call gastroenterology for [facility] was consulted also critical care was consulted.  Patient started responding to IV fluids and 2 units of PRBC has been ordered which is about to be transfused.  Patient was started on octreotide and Protonix given history of prior varices in the EGD done during September 2020.  COVID test was negative.  CT abdomen pelvis done in the ER did show features concerning for esophageal varices but no definite evidence of active bleed also shows mild ascites and features of hepatocellular carcinoma and also pleural effusion on the left side.  Review of Systems: As per HPI, rest all negative.   Past Medical History:  Diagnosis Date   Anemia    Breast cancer ([place]) 08/30/2019   Cancer ([place])    liver- just diagnosed, not started treatment yet   Cervicalgia    COPD (chronic obstructive pulmonary disease) ([place])    Esophageal varices (HCC)    GERD (gastroesophageal reflux disease)    Headache     Hepatic cirrhosis (HCC)    Hepatitis C    resolved 2018   Hepatocellular carcinoma (HCC)    IBS (irritable bowel syndrome)    Lactose intolerance    Personal history of radiation therapy 11/2019   Right breast   Portal hypertension ([place])    Raynaud's syndrome    Thrombocytopenia ([place])     Past Surgical History:  Procedure Laterality Date   BREAST BIOPSY Right 09/2019   BREAST LUMPECTOMY Right 09/2019   BREAST LUMPECTOMY WITH AXILLARY LYMPH NODE BIOPSY Right 10/16/2019   Procedure: RIGHT BREAST LUMPECTOMY WITH SENTINEL LYMPH NODE BIOPSY;  Surgeon: Stark Klein, MD;  Location: [facility];  Service: General;  Laterality: Right;   COLONOSCOPY     HAMMER TOE SURGERY Left 2012   pins placed in great toe, 2nd,3rd, 4th toes, all pins removed except great toe   IR PARACENTESIS  11/13/2019   IR RADIOLOGIST EVAL & MGMT  08/16/2019   IR RADIOLOGIST EVAL & MGMT  02/06/2020   IR RADIOLOGIST EVAL & MGMT  06/26/2020   IR RADIOLOGIST EVAL & MGMT  12/31/2020   IR RADIOLOGIST EVAL & MGMT  07/21/2021   IR RADIOLOGIST EVAL & MGMT  10/08/2021   IR THORACENTESIS ASP PLEURAL SPACE W/IMG GUIDE  10/12/2021   IR THORACENTESIS ASP PLEURAL SPACE W/IMG GUIDE  10/26/2021   PELVIC LAPAROSCOPY Bilateral 1992   RADIOLOGY WITH ANESTHESIA N/A 09/16/2021   Procedure: CT MICROWAVE ABLATION;  Surgeon: Aletta Edouard, MD;  Location: [facility];  Service: Radiology;  Laterality: N/A;   UPPER GASTROINTESTINAL ENDOSCOPY  2002   had esophageal dilitation     reports that she has been smoking cigarettes. She started smoking about 42 years ago. She has a 15.00 pack-year smoking history. She has never used smokeless tobacco. She reports that she does not currently use alcohol after a past usage of about 6.0 standard drinks per week. She reports that she does not use drugs.  Allergies  Allergen Reactions   Hydromorphone Hcl Nausea And Vomiting    Severe vomiting   Aspirin Nausea Only    Family History  Problem Relation Age of  Onset   Liver cancer Mother    Lung cancer Mother    Lung cancer Father    Liver cancer Maternal Grandmother    Colon cancer Neg Hx    Esophageal cancer Neg Hx    Stomach cancer Neg Hx    Rectal cancer Neg Hx     Prior to Admission medications   Medication Sig Start Date End Date Taking? Authorizing Provider  dicyclomine (BENTYL) 20 MG tablet Take one tablet every 4-6 hours as needed for abdominal cramping Patient taking differently: Take 20 mg by mouth every 4 (four) hours as needed (abdominal cramping). 11/21/19  Yes Irene Shipper, MD  ibuprofen (ADVIL) 200 MG tablet Take 400 mg by mouth every 6 (six) hours as needed for mild pain.   Yes [provider]  loperamide (IMODIUM) 2 MG capsule Take 4 mg by mouth every other day.   Yes [provider]  nicotine (NICODERM CQ - DOSED IN MG/24 HOURS) 21 mg/24hr patch Place 21 mg onto the skin daily as needed (smoking cessation on work days).   Yes [provider]  OVER THE COUNTER MEDICATION Take 2 tablets by mouth every morning. Over the counter acid reducer   Yes [provider]  promethazine (PHENERGAN) 25 MG tablet Take 1 tablet (25 mg total) by mouth every 8 (eight) hours as needed for nausea or vomiting. 05/25/21  Yes Edrick Kins, DPM  spironolactone (ALDACTONE) 50 MG tablet Take 50 mg by mouth every morning. 10/09/21  Yes [provider]  tamoxifen (NOLVADEX) 20 MG tablet TAKE 1 TABLET BY MOUTH EVERY DAY Patient taking differently: Take 20 mg by mouth every morning. 10/13/20  Yes Truitt Merle, MD  pantoprazole (PROTONIX) 40 MG tablet Take 1 tablet (40 mg total) by mouth daily. Office visit for future refills Patient not taking: Reported on 10/26/2021 06/16/21   Irene Shipper, MD    Physical Exam: Constitutional: Moderately built and nourished. Vitals:   10/26/21 1945 10/26/21 2020 10/26/21 2045 10/26/21 2225  BP: 96/76 99/68 93/67  (!) 82/53  Pulse: (!) 122 (!) 122 (!) 122 (!) 108  Resp: 20  (!) 21 20 20   Temp:    98.2 F (36.8 C)  TempSrc:    Oral  SpO2: 95% 95% 96% 92%  Weight:      Height:       Eyes: Anicteric no pallor. ENMT: No discharge from the ears eyes nose and mouth. Neck: No mass felt.  No neck rigidity. Respiratory: No rhonchi or crepitations. Cardiovascular: S1-S2 heard. Abdomen: Mildly distended nontender bowel sound present. Musculoskeletal: No edema. Skin: No rash. Neurologic: Alert awake oriented time place and person.  Moves all extremities. Psychiatric: Appears normal.  Normal affect.   Labs on Admission: I have personally reviewed following labs and imaging studies  CBC: Recent Labs  Lab  10/26/21 1757  WBC 9.6  HGB 8.3*  HCT 28.9*  MCV 79.0*  PLT 268*   Basic Metabolic Panel: Recent Labs  Lab 10/26/21 1757  NA 137  K 4.3  CL 108  CO2 22  GLUCOSE 98  BUN 15  CREATININE 0.53  CALCIUM 7.8*   GFR: Estimated Creatinine Clearance: 63.3 mL/min (by C-G formula based on SCr of 0.53 mg/dL). Liver Function Tests: Recent Labs  Lab 10/26/21 1757  AST 44*  ALT 23  ALKPHOS 56  BILITOT 1.6*  PROT 6.6  ALBUMIN 2.8*   Recent Labs  Lab 10/26/21 1757  LIPASE 38   No results for input(s): AMMONIA in the last 168 hours. Coagulation Profile: Recent Labs  Lab 10/26/21 2001  INR 1.8*   Cardiac Enzymes: No results for input(s): CKTOTAL, CKMB, CKMBINDEX, TROPONINI in the last 168 hours. BNP (last 3 results) No results for input(s): PROBNP in the last 8760 hours. HbA1C: No results for input(s): HGBA1C in the last 72 hours. CBG: No results for input(s): GLUCAP in the last 168 hours. Lipid Profile: No results for input(s): CHOL, HDL, LDLCALC, TRIG, CHOLHDL, LDLDIRECT in the last 72 hours. Thyroid Function Tests: No results for input(s): TSH, T4TOTAL, FREET4, T3FREE, THYROIDAB in the last 72 hours. Anemia Panel: No results for input(s): VITAMINB12, FOLATE, FERRITIN, TIBC, IRON, RETICCTPCT in the last 72 hours. Urine analysis:     Component Value Date/Time   COLORURINE YELLOW 07/04/2019 [place] 07/04/2019 1123   LABSPEC 1.005 07/04/2019 1123   PHURINE 7.0 07/04/2019 1123   GLUCOSEU NEGATIVE 07/04/2019 1123   HGBUR SMALL (A) 07/04/2019 1123   BILIRUBINUR NEGATIVE 07/04/2019 1123   KETONESUR NEGATIVE 07/04/2019 1123   PROTEINUR NEGATIVE 07/04/2019 1123   NITRITE NEGATIVE 07/04/2019 1123   LEUKOCYTESUR NEGATIVE 07/04/2019 1123   Sepsis Labs: @LABRCNTIP (procalcitonin:4,lacticidven:4) ) Recent Results (from the past 240 hour(s))  Resp Panel by RT-PCR (Flu A&B, Covid) Nasopharyngeal Swab     Status: None   Collection Time: 10/26/21  5:57 PM   Specimen: Nasopharyngeal Swab; Nasopharyngeal(NP) swabs in vial transport medium  Result Value Ref Range Status   SARS Coronavirus 2 by RT PCR NEGATIVE NEGATIVE Final    Comment: (NOTE) SARS-CoV-2 target nucleic acids are NOT DETECTED.  The SARS-CoV-2 RNA is generally detectable in upper respiratory specimens during the acute phase of infection. The lowest concentration of SARS-CoV-2 viral copies this assay can detect is 138 copies/mL. A negative result does not preclude SARS-Cov-2 infection and should not be used as the sole basis for treatment or other patient management decisions. A negative result may occur with  improper specimen collection/handling, submission of specimen other than nasopharyngeal swab, presence of viral mutation(s) within the areas targeted by this assay, and inadequate number of viral copies(<138 copies/mL). A negative result must be combined with clinical observations, patient history, and epidemiological information. The expected result is Negative.  Fact Sheet for Patients:  EntrepreneurPulse.com.au  Fact Sheet for Healthcare Providers:  IncredibleEmployment.be  This test is no t yet approved or cleared by the [place] FDA and  has been authorized for detection and/or diagnosis  of SARS-CoV-2 by FDA under an Emergency Use Authorization (EUA). This EUA will remain  in effect (meaning this test can be used) for the duration of the COVID-19 declaration under Section 564(b)(1) of the Act, 21 U.S.C.section 360bbb-3(b)(1), unless the authorization is terminated  or revoked sooner.       Influenza A by PCR NEGATIVE NEGATIVE Final   Influenza  B by PCR NEGATIVE NEGATIVE Final    Comment: (NOTE) The Xpert Xpress SARS-CoV-2/FLU/RSV plus assay is intended as an aid in the diagnosis of influenza from Nasopharyngeal swab specimens and should not be used as a sole basis for treatment. Nasal washings and aspirates are unacceptable for Xpert Xpress SARS-CoV-2/FLU/RSV testing.  Fact Sheet for Patients: EntrepreneurPulse.com.au  Fact Sheet for Healthcare Providers: IncredibleEmployment.be  This test is not yet approved or cleared by the [place] FDA and has been authorized for detection and/or diagnosis of SARS-CoV-2 by FDA under an Emergency Use Authorization (EUA). This EUA will remain in effect (meaning this test can be used) for the duration of the COVID-19 declaration under Section 564(b)(1) of the Act, 21 U.S.C. section 360bbb-3(b)(1), unless the authorization is terminated or revoked.  Performed at [facility], [place] [address] [place], [place] [address]      Radiological Exams on Admission: DG Chest 1 View  Result Date: 10/26/2021 CLINICAL DATA:  Pleural effusion, status post left thoracentesis. EXAM: CHEST  1 VIEW COMPARISON:  10/12/2021 and CT chest 09/30/2021. FINDINGS: Trachea is midline. Heart size normal. Small residual left pleural effusion, decreased from 10/12/2021. Trace left apical pneumothorax. Minimal left basilar atelectasis. Right lung is clear. Surgical clips in right axilla. IMPRESSION: 1. Trace left apical pneumothorax status post left thoracentesis. Finding may not be clinically  significant. These results were called by telephone at the time of interpretation on 10/26/2021 at 1:38 pm to provider [facility] , who verbally acknowledged these results. 2. Small residual left pleural effusion with left basilar subsegmental atelectasis. Electronically Signed   By: Lorin Picket M.D.   On: 10/26/2021 13:38   CT Abdomen Pelvis W Contrast  Result Date: 10/26/2021 CLINICAL DATA:  Acute abdominal pain. History of esophageal varices. Hemoptysis of bright red blood yesterday. Black tarry stools since yesterday. EXAM: CT ABDOMEN AND PELVIS WITH CONTRAST TECHNIQUE: Multidetector CT imaging of the abdomen and pelvis was performed using the standard protocol following bolus administration of intravenous contrast. CONTRAST:  83mL OMNIPAQUE IOHEXOL 350 MG/ML SOLN COMPARISON:  CT abdomen dated 07/04/2019 FINDINGS: Lower chest: LEFT pleural effusion, at least moderate in size, with adjacent atelectasis. RIGHT lung base is clear. Esophageal varices again identified within the lower esophagus, incompletely imaged. Hepatobiliary: Hypodense mass within the LEFT liver lobe, segment 2, measuring 4 cm, new compared to the CT abdomen of 07/04/2019 but most recently described on multiple abdominal MRIs, most recently described on MRI abdomen of 07/17/2021 as a known hepatocellular carcinoma. Additional hypodense lesion within the posterior RIGHT liver lobe, measuring 2.5 cm, with ring enhancement, compatible with another known neoplastic lesion in segment 6, likely enlarged compared to the most recent MRI abdomen of 07/17/2021. Additional smaller lesions scattered within the liver, also as previously described by MRI. Liver contours again appear nodular throughout, compatible with underlying cirrhosis. Pancreas: Unremarkable. No pancreatic ductal dilatation or surrounding inflammatory changes. Spleen: Normal in size without focal abnormality. Adrenals/Urinary Tract: Adrenal glands are unremarkable. Kidneys appear  normal without mass, stone or hydronephrosis. Stomach/Bowel: No dilated large or small bowel loops. At least mild thickening of the walls of the entire colon. Additional probable thickening of the walls of the distal small bowel. More proximal small bowel is unremarkable. Stomach is unremarkable, partially decompressed. Vascular/Lymphatic: No acute-appearing vascular abnormality. Lower esophageal varices, as described above. Reproductive: Uterus and bilateral adnexa are unremarkable. Other: Trace ascites within the pelvis. No abscess collection. No free intraperitoneal air. Musculoskeletal: No acute-appearing osseous abnormality. IMPRESSION: 1.  LEFT pleural effusion, at least moderate in size, with adjacent atelectasis. The pleural effusion is decreased compared to recent chest CT angiogram of 09/30/2021, status post interval thoracentesis. 2. At least mild thickening of the walls of the entire colon and distal small bowel, suggestive of a diffuse enterocolitis of infectious or inflammatory nature. No bowel obstruction. 3. Prominent esophageal varices at the level of the lower esophagus, incompletely imaged. Can not exclude active hemorrhage within the lower esophagus, but no layering hemorrhage within the stomach to confirm active hemorrhage. 4. Cirrhotic liver. 5. Known neoplastic lesions within the liver, most recently described on MRI abdomen of 07/17/2021. 6. Trace ascites within the pelvis. No abscess collection. No free intraperitoneal air. Electronically Signed   By: Franki Cabot M.D.   On: 10/26/2021 20:33   DG Chest 1V REPEAT Same Day  Result Date: 10/26/2021 CLINICAL DATA:  Post thoracentesis.  Evaluate for pneumothorax EXAM: CHEST - 1 VIEW SAME DAY COMPARISON:  IR ultrasound, earlier same day. Chest XR, 10/26/2021 and 10/09/2021. FINDINGS: Cardiomediastinal silhouette is within normal limits. Lungs are well inflated. Decreased volume of LEFT pleural effusion, as compared to 10/09/2021 chest XR. No  enlarging pneumothorax. RIGHT chest and axillary surgical clips. No acute osseous abnormality. IMPRESSION: No enlarging pneumothorax. Electronically Signed   By: Michaelle Birks M.D.   On: 10/26/2021 15:28   DG Chest Port 1 View  Result Date: 10/26/2021 CLINICAL DATA:  Vomiting, possible hematemesis. Possible blood in stool. Left thoracentesis today. EXAM: PORTABLE CHEST 1 VIEW COMPARISON:  10/26/2021 1:06 p.m. and 3:13 p.m. FINDINGS: Emphysema. Small left pleural effusion with a hazy density along the left hemidiaphragm, probably from layering effusion given the semi erect positioning although a component of re-expansion edema in this segment of the lung is not excluded. No visible pneumothorax. Heart size within normal limits. Right axillary and breast clips noted. IMPRESSION: 1. Small remaining left pleural effusion likely layering due to the semi erect positioning. There is some hazy density left lung base probably from layering effusion, less likely from re-expansion edema of this segment of lung. 2. No pneumothorax. 3.  Emphysema (ICD10-J43.9). Electronically Signed   By: Van Clines M.D.   On: 10/26/2021 19:00   IR THORACENTESIS ASP PLEURAL SPACE W/IMG GUIDE  Result Date: 10/26/2021 INDICATION: History of hepatocellular carcinoma, shortness of breast. Request for therapeutic thoracentesis. EXAM: ULTRASOUND GUIDED LEFT THORACENTESIS MEDICATIONS: 10 mL 1% lidocaine COMPLICATIONS: SIR Level A - No therapy, no consequence. PROCEDURE: An ultrasound guided thoracentesis was thoroughly discussed with the patient and questions answered. The benefits, risks, alternatives and complications were also discussed. The patient understands and wishes to proceed with the procedure. Written consent was obtained. Ultrasound was performed to localize and mark an adequate pocket of fluid in the left chest. The area was then prepped and draped in the normal sterile fashion. 1% Lidocaine was used for local anesthesia.  Under ultrasound guidance a 6 Fr Safe-T-Centesis catheter was introduced. Thoracentesis was performed. The catheter was removed and a dressing applied. FINDINGS: A total of approximately 700 cc of hazy yellow fluid was removed. Postprocedure chest x-ray reviewed, trace left apical pneumothorax seen. IMPRESSION: Successful ultrasound guided left thoracentesis yielding 700 cc of pleural fluid. Procedure performed by: Durenda Guthrie, IR PA-C Electronically Signed   By: Michaelle Birks M.D.   On: 10/26/2021 15:25      Assessment/Plan Principal Problem:   Acute GI bleeding Active Problems:   Cirrhosis ([place])   Malignant neoplasm of central portion of right breast in  female, estrogen receptor positive ([place])   Esophageal varices ([place])    Acute GI bleed with known history of esophageal varices with history of cirrhosis of the liver likely source for the GI bleed with concern for developing hemorrhagic shock patient has been ordered 2 units of PRBC transfusion and placed on octreotide and Protonix infusion we will keep patient n.p.o. and also on empiric antibiotics given history of cirrhosis.  Gastroenterologist and critical care has been consulted.  Continue with fluids. Anemia hemoglobin appears to be at baseline but given the active bleed could be lower than what is measured.  Receiving 2 units of PRBC. Coagulopathy secondary to cirrhosis INR is around 1.8.  Will give vitamin K.  Follow INR. Thoracentesis for left pleural effusion done today subsequent which x-ray did show small apical pneumothorax but subsequent x-rays do not show that. Hepatocellular carcinoma and breast cancer being followed by [name] oncologist.  Patient did undergo microwave ablation of the liver lesions last month.  Subsequent which patient did develop pleural effusions.  Had underwent at least 3 thoracentesis of 4 in the last month. Prior history of alcohol abuse has not had any alcohol recently. Thrombocytopenia -likely from cirrhosis  appears to be chronic follow CBC.  Since patient presented with acute GI bleed with possible developing hemorrhagic shock is receiving blood transfusion and will need close monitoring and inpatient status.   DVT prophylaxis: SCDs.  Avoiding anticoagulation in the setting of acute GI bleed. Code Status: Full code. Family Communication: Discussed with patient. Disposition Plan: Home. Consults called: Copywriter, advertising and critical care. Admission status: Inpatient.   Rise Patience MD [facility] Pager [phone].  If 7PM-7AM, please contact night-coverage [email or web address] Password [email or web address]  10/26/2021, 10:40 PM

## 2021-10-26 NOTE — ED Provider Notes (Addendum)
[facility] DEPT Provider Note   CSN: [id number] Arrival date & time: 10/26/21  1604     History Chief Complaint  Patient presents with   Hematemesis    Theresa Rocha is a 55 y.o. female.  Patient with no cirrhosis followed by [facility] gastroenterology.  Patient also known to have varices.  Patient had 2 big episodes of hematemesis today.  And also been having some black tarry stools since yesterday.  Today associated with some abdominal cramping and abdominal discomfort.  The vomiting was red blood.  Has felt lightheaded.  But no passing out.  Presenting vital signs reassuring temp 98.3 heart rate 96 blood pressure 108/68.  Oxygen saturation on room air is 95%.  She had contacted her gastroenterology group earlier today.  So they are aware.  In addition patient had thoracentesis done today for pleural effusion.      Past Medical History:  Diagnosis Date   Anemia    Breast cancer ([place]) 08/30/2019   Cancer ([place])    liver- just diagnosed, not started treatment yet   Cervicalgia    COPD (chronic obstructive pulmonary disease) ([place])    Esophageal varices (HCC)    GERD (gastroesophageal reflux disease)    Headache    Hepatic cirrhosis (HCC)    Hepatitis C    resolved 2018   Hepatocellular carcinoma (HCC)    IBS (irritable bowel syndrome)    Lactose intolerance    Personal history of radiation therapy 11/2019   Right breast   Portal hypertension ([place])    Raynaud's syndrome    Thrombocytopenia ([place])     Patient Active Problem List   Diagnosis Date Noted   Pleural effusion 09/30/2021   Iron deficiency anemia due to chronic blood loss 09/30/2021   Hypokalemia 09/30/2021   Prolonged QT interval 09/30/2021   Thrombocytopenia ([place])    Bunion 07/10/2021   Esophageal varices ([place]) 05/12/2021   Malignant neoplasm of central portion of right breast in female, estrogen receptor positive ([place]) 09/05/2019   Malignant tumor of breast ([place]) 08/19/2019    Hepatocellular carcinoma ([place]) 08/02/2019   Ascites due to alcoholic hepatitis 85/12/7739   Liver masses 07/26/2019   Cirrhosis ([place]) 08/11/2017   Acute right ankle pain 08/18/2016   Mass of right ankle 08/18/2016   Right foot pain 08/18/2016   HEMORRHOIDS, INTERNAL 09/09/2010   IBS 09/09/2010   Chronic hepatitis C without hepatic coma ([place]) 06/19/2010   GERD 06/19/2010   DIARRHEA 06/19/2010   ABDOMINAL PAIN -GENERALIZED 06/19/2010    Past Surgical History:  Procedure Laterality Date   BREAST BIOPSY Right 09/2019   BREAST LUMPECTOMY Right 09/2019   BREAST LUMPECTOMY WITH AXILLARY LYMPH NODE BIOPSY Right 10/16/2019   Procedure: RIGHT BREAST LUMPECTOMY WITH SENTINEL LYMPH NODE BIOPSY;  Surgeon: Stark Klein, MD;  Location: [facility];  Service: General;  Laterality: Right;   COLONOSCOPY     HAMMER TOE SURGERY Left 2012   pins placed in great toe, 2nd,3rd, 4th toes, all pins removed except great toe   IR PARACENTESIS  11/13/2019   IR RADIOLOGIST EVAL & MGMT  08/16/2019   IR RADIOLOGIST EVAL & MGMT  02/06/2020   IR RADIOLOGIST EVAL & MGMT  06/26/2020   IR RADIOLOGIST EVAL & MGMT  12/31/2020   IR RADIOLOGIST EVAL & MGMT  07/21/2021   IR RADIOLOGIST EVAL & MGMT  10/08/2021   IR THORACENTESIS ASP PLEURAL SPACE W/IMG GUIDE  10/12/2021   IR THORACENTESIS ASP PLEURAL SPACE W/IMG GUIDE  10/26/2021   PELVIC LAPAROSCOPY Bilateral 1992   RADIOLOGY WITH ANESTHESIA N/A 09/16/2021   Procedure: CT MICROWAVE ABLATION;  Surgeon: Aletta Edouard, MD;  Location: [facility];  Service: Radiology;  Laterality: N/A;   UPPER GASTROINTESTINAL ENDOSCOPY  2002   had esophageal dilitation     OB History   No obstetric history on file.     Family History  Problem Relation Age of Onset   Liver cancer Mother    Lung cancer Mother    Lung cancer Father    Liver cancer Maternal Grandmother    Colon cancer Neg Hx    Esophageal cancer Neg Hx    Stomach cancer Neg Hx    Rectal cancer Neg Hx     Social History    Tobacco Use   Smoking status: Every Day    Packs/day: 0.50    Years: 30.00    Pack years: 15.00    Types: Cigarettes    Start date: 11/29/1978   Smokeless tobacco: Never  Vaping Use   Vaping Use: Never used  Substance Use Topics   Alcohol use: Not Currently    Alcohol/week: 6.0 standard drinks    Types: 6 Cans of beer per week    Comment: stopped 06/2019   Drug use: No    Home Medications Prior to Admission medications   Medication Sig Start Date End Date Taking? Authorizing Provider  dicyclomine (BENTYL) 20 MG tablet Take one tablet every 4-6 hours as needed for abdominal cramping Patient taking differently: Take 20 mg by mouth every 4 (four) hours as needed (abdominal cramping). 11/21/19  Yes Irene Shipper, MD  ibuprofen (ADVIL) 200 MG tablet Take 400 mg by mouth every 6 (six) hours as needed for mild pain.   Yes [provider]  loperamide (IMODIUM) 2 MG capsule Take 4 mg by mouth every other day.   Yes [provider]  nicotine (NICODERM CQ - DOSED IN MG/24 HOURS) 21 mg/24hr patch Place 21 mg onto the skin daily as needed (smoking cessation on work days).   Yes [provider]  OVER THE COUNTER MEDICATION Take 2 tablets by mouth every morning. Over the counter acid reducer   Yes [provider]  promethazine (PHENERGAN) 25 MG tablet Take 1 tablet (25 mg total) by mouth every 8 (eight) hours as needed for nausea or vomiting. 05/25/21  Yes Edrick Kins, DPM  spironolactone (ALDACTONE) 50 MG tablet Take 50 mg by mouth every morning. 10/09/21  Yes [provider]  tamoxifen (NOLVADEX) 20 MG tablet TAKE 1 TABLET BY MOUTH EVERY DAY Patient taking differently: Take 20 mg by mouth every morning. 10/13/20  Yes Truitt Merle, MD  pantoprazole (PROTONIX) 40 MG tablet Take 1 tablet (40 mg total) by mouth daily. Office visit for future refills Patient not taking: Reported on 10/26/2021 06/16/21   Irene Shipper, MD    Allergies    Hydromorphone hcl  and Aspirin  Review of Systems   Review of Systems  Constitutional:  Negative for chills and fever.  HENT:  Negative for ear pain and sore throat.   Eyes:  Negative for pain and visual disturbance.  Respiratory:  Negative for cough and shortness of breath.   Cardiovascular:  Negative for chest pain and palpitations.  Gastrointestinal:  Positive for abdominal pain, blood in stool, nausea and vomiting.  Genitourinary:  Negative for dysuria and hematuria.  Musculoskeletal:  Negative for arthralgias and back pain.  Skin:  Negative for color change and  rash.  Neurological:  Positive for light-headedness. Negative for seizures and syncope.  All other systems reviewed and are negative.  Physical Exam Updated Vital Signs BP 96/64   Pulse (!) 122   Temp 98.3 F (36.8 C) (Oral)   Resp (!) 26   Ht 1.6 m (5\' 3" )   Wt 49.9 kg   LMP 04/20/2013   SpO2 96%   BMI 19.49 kg/m   Physical Exam Vitals and nursing note reviewed.  Constitutional:      General: She is not in acute distress.    Appearance: Normal appearance. She is well-developed.  HENT:     Head: Normocephalic and atraumatic.  Eyes:     Extraocular Movements: Extraocular movements intact.     Conjunctiva/sclera: Conjunctivae normal.     Pupils: Pupils are equal, round, and reactive to light.  Cardiovascular:     Rate and Rhythm: Normal rate and regular rhythm.     Heart sounds: No murmur heard. Pulmonary:     Effort: Pulmonary effort is normal. No respiratory distress.     Breath sounds: Normal breath sounds.  Abdominal:     General: There is no distension.     Palpations: Abdomen is soft.     Tenderness: There is no abdominal tenderness. There is no guarding.  Genitourinary:    Rectum: Guaiac result positive.  Musculoskeletal:        General: No swelling. Normal range of motion.     Cervical back: Normal range of motion and neck supple.  Skin:    General: Skin is warm and dry.     Capillary Refill: Capillary refill  takes less than 2 seconds.  Neurological:     General: No focal deficit present.     Mental Status: She is alert and oriented to person, place, and time.  Psychiatric:        Mood and Affect: Mood normal.    ED Results / Procedures / Treatments   Labs (all labs ordered are listed, but only abnormal results are displayed) Labs Reviewed  COMPREHENSIVE METABOLIC PANEL - Abnormal; Notable for the following components:      Result Value   Calcium 7.8 (*)    Albumin 2.8 (*)    AST 44 (*)    Total Bilirubin 1.6 (*)    All other components within normal limits  CBC - Abnormal; Notable for the following components:   RBC 3.66 (*)    Hemoglobin 8.3 (*)    HCT 28.9 (*)    MCV 79.0 (*)    MCH 22.7 (*)    MCHC 28.7 (*)    RDW 22.7 (*)    Platelets 124 (*)    All other components within normal limits  RESP PANEL BY RT-PCR (FLU A&B, COVID) ARPGX2  LIPASE, BLOOD  POC OCCULT BLOOD, ED  POC OCCULT BLOOD, ED  TYPE AND SCREEN    EKG None  Radiology DG Chest 1 View  Result Date: 10/26/2021 CLINICAL DATA:  Pleural effusion, status post left thoracentesis. EXAM: CHEST  1 VIEW COMPARISON:  10/12/2021 and CT chest 09/30/2021. FINDINGS: Trachea is midline. Heart size normal. Small residual left pleural effusion, decreased from 10/12/2021. Trace left apical pneumothorax. Minimal left basilar atelectasis. Right lung is clear. Surgical clips in right axilla. IMPRESSION: 1. Trace left apical pneumothorax status post left thoracentesis. Finding may not be clinically significant. These results were called by telephone at the time of interpretation on 10/26/2021 at 1:38 pm to provider [facility] , who verbally  acknowledged these results. 2. Small residual left pleural effusion with left basilar subsegmental atelectasis. Electronically Signed   By: Lorin Picket M.D.   On: 10/26/2021 13:38   DG Chest 1V REPEAT Same Day  Result Date: 10/26/2021 CLINICAL DATA:  Post thoracentesis.  Evaluate for  pneumothorax EXAM: CHEST - 1 VIEW SAME DAY COMPARISON:  IR ultrasound, earlier same day. Chest XR, 10/26/2021 and 10/09/2021. FINDINGS: Cardiomediastinal silhouette is within normal limits. Lungs are well inflated. Decreased volume of LEFT pleural effusion, as compared to 10/09/2021 chest XR. No enlarging pneumothorax. RIGHT chest and axillary surgical clips. No acute osseous abnormality. IMPRESSION: No enlarging pneumothorax. Electronically Signed   By: Michaelle Birks M.D.   On: 10/26/2021 15:28   DG Chest Port 1 View  Result Date: 10/26/2021 CLINICAL DATA:  Vomiting, possible hematemesis. Possible blood in stool. Left thoracentesis today. EXAM: PORTABLE CHEST 1 VIEW COMPARISON:  10/26/2021 1:06 p.m. and 3:13 p.m. FINDINGS: Emphysema. Small left pleural effusion with a hazy density along the left hemidiaphragm, probably from layering effusion given the semi erect positioning although a component of re-expansion edema in this segment of the lung is not excluded. No visible pneumothorax. Heart size within normal limits. Right axillary and breast clips noted. IMPRESSION: 1. Small remaining left pleural effusion likely layering due to the semi erect positioning. There is some hazy density left lung base probably from layering effusion, less likely from re-expansion edema of this segment of lung. 2. No pneumothorax. 3.  Emphysema (ICD10-J43.9). Electronically Signed   By: Van Clines M.D.   On: 10/26/2021 19:00   IR THORACENTESIS ASP PLEURAL SPACE W/IMG GUIDE  Result Date: 10/26/2021 INDICATION: History of hepatocellular carcinoma, shortness of breast. Request for therapeutic thoracentesis. EXAM: ULTRASOUND GUIDED LEFT THORACENTESIS MEDICATIONS: 10 mL 1% lidocaine COMPLICATIONS: SIR Level A - No therapy, no consequence. PROCEDURE: An ultrasound guided thoracentesis was thoroughly discussed with the patient and questions answered. The benefits, risks, alternatives and complications were also discussed. The  patient understands and wishes to proceed with the procedure. Written consent was obtained. Ultrasound was performed to localize and mark an adequate pocket of fluid in the left chest. The area was then prepped and draped in the normal sterile fashion. 1% Lidocaine was used for local anesthesia. Under ultrasound guidance a 6 Fr Safe-T-Centesis catheter was introduced. Thoracentesis was performed. The catheter was removed and a dressing applied. FINDINGS: A total of approximately 700 cc of hazy yellow fluid was removed. Postprocedure chest x-ray reviewed, trace left apical pneumothorax seen. IMPRESSION: Successful ultrasound guided left thoracentesis yielding 700 cc of pleural fluid. Procedure performed by: Durenda Guthrie, IR PA-C Electronically Signed   By: Michaelle Birks M.D.   On: 10/26/2021 15:25    Procedures Procedures   Medications Ordered in ED Medications  pantoprozole (PROTONIX) 80 mg /NS 100 mL infusion (8 mg/hr Intravenous New Bag/Given 10/26/21 1815)  pantoprazole (PROTONIX) injection 40 mg (has no administration in time range)  0.9 %  sodium chloride infusion ( Intravenous New Bag/Given 10/26/21 1828)  pantoprazole (PROTONIX) 80 mg /NS 100 mL IVPB (80 mg Intravenous New Bag/Given 10/26/21 1815)  morphine 2 MG/ML injection 2 mg (2 mg Intravenous Given 10/26/21 1922)  ondansetron (ZOFRAN) injection 4 mg (4 mg Intravenous Given 10/26/21 1921)    ED Course  I have reviewed the triage vital signs and the nursing notes.  Pertinent labs & imaging results that were available during my care of the patient were reviewed by me and considered in my medical decision making (  see chart for details).    MDM Rules/Calculators/A&P                         CRITICAL CARE Performed by: Fredia Sorrow Total critical care time: 45 minutes Critical care time was exclusive of separately billable procedures and treating other patients. Critical care was necessary to treat or prevent imminent or  life-threatening deterioration. Critical care was time spent personally by me on the following activities: development of treatment plan with patient and/or surrogate as well as nursing, discussions with consultants, evaluation of patient's response to treatment, examination of patient, obtaining history from patient or surrogate, ordering and performing treatments and interventions, ordering and review of laboratory studies, ordering and review of radiographic studies, pulse oximetry and re-evaluation of patient's condition.   Patient's vital signs have been normal.  No further vomiting of blood here today.  Liver function test significant for a bilirubin of 1.6 this time.  Renal functions normal.  Electrolytes normal.  CBC hemoglobin of 8.3 is fairly stable for her.  White blood cell count is 9.6.  Platelets 124.  COVID testing flu testing negative.  Chest x-ray without any acute findings still small remaining left pleural effusion that is the side she had the thoracentesis on.  We will go ahead and add on INR.  We will discussed with [facility] gastroenterology on-call that should be Dr. Candis Schatz.  Patient will require admission.  Patient started on Zofran morphine for the pain will get CT scan of the abdomen.  Also patient started on Protonix drip.  Will discuss with gastroenterology whether they want [place] to start octreotide.   Final Clinical Impression(s) / ED Diagnoses Final diagnoses:  Hematemesis with nausea    Rx / DC Orders ED Discharge Orders     None        Fredia Sorrow, MD 10/26/21 1928  Patient's vital signs are starting get a little more concerning.  She is tachycardic at 122 and blood pressure 96/64.  We will be discussing with gastroenterology.    Fredia Sorrow, MD 10/26/21 1931  Discussed with Dr. Candis Schatz.  Patient's blood pressures also dropped down to around 86 systolic.  Had another episode of vomiting blood.  Patient most likely will need critical care  admission.  We will start the Octreotide bolus and infusion.  Patient is going to receive 2 L of normal saline.  That was ordered before she went to CT of the abdomen.  Dr. Candis Schatz wants her n.p.o. after midnight.  Patient and may end up requiring blood transfusions.    Fredia Sorrow, MD 10/26/21 2013  Spoke with critical care Dr. Marchelle Gearing.  Patient will be admitted to ICU.  She may have the hospitalist initiate the admission in the meantime.  Will order 2 units of packed red blood cells to be transfused as per her request which is appropriate.    Fredia Sorrow, MD 10/26/21 [phone]

## 2021-10-26 NOTE — Progress Notes (Signed)
Patient ID: Theresa Rocha, female   DOB: Apr 03, 1966, 55 y.o.   MRN: [id number]  Order for thoracentesis placed per Dr. Kathlene Cote. Called pt and made her aware to call to schedule procedure.   Narda Rutherford, AGNP-BC 10/26/2021, 10:29 AM

## 2021-10-26 NOTE — ED Triage Notes (Signed)
Patient areports that she has a history of esophageal varices and began throwing up large amounts of bright red blood. Patient also reports that she has had frequent small amounts of black tarry stools since yesterday.

## 2021-10-26 NOTE — Procedures (Signed)
PROCEDURE SUMMARY:  Successful image-guided left thoracentesis. Yielded 700 cc of hazy yellow fluid. Pt tolerated procedure well. No immediate complications. EBL = trace   Specimen was not sent for labs. CXR ordered.  Right pleural space also visualized, no fluid seen.   Please see imaging section of Epic for full dictation.  Armando Gang Lajuana Patchell PA-C 10/26/2021 1:11 PM

## 2021-10-27 ENCOUNTER — Encounter (HOSPITAL_COMMUNITY)
Admission: EM | Disposition: A | Discharge status: Home / Self Care | Payer: Self-pay | Source: Home / Self Care | Attending: Family Medicine

## 2021-10-27 ENCOUNTER — Encounter (HOSPITAL_COMMUNITY): Payer: Self-pay | Admitting: Internal Medicine

## 2021-10-27 ENCOUNTER — Inpatient Hospital Stay (HOSPITAL_COMMUNITY): Payer: BC Managed Care – PPO | Admitting: Certified Registered"

## 2021-10-27 DIAGNOSIS — I8511 Secondary esophageal varices with bleeding: Secondary | ICD-10-CM | POA: Diagnosis not present

## 2021-10-27 DIAGNOSIS — K920 Hematemesis: Secondary | ICD-10-CM

## 2021-10-27 DIAGNOSIS — D62 Acute posthemorrhagic anemia: Secondary | ICD-10-CM

## 2021-10-27 DIAGNOSIS — K7030 Alcoholic cirrhosis of liver without ascites: Secondary | ICD-10-CM | POA: Diagnosis not present

## 2021-10-27 DIAGNOSIS — K921 Melena: Secondary | ICD-10-CM

## 2021-10-27 DIAGNOSIS — I8510 Secondary esophageal varices without bleeding: Secondary | ICD-10-CM | POA: Diagnosis not present

## 2021-10-27 DIAGNOSIS — K922 Gastrointestinal hemorrhage, unspecified: Secondary | ICD-10-CM

## 2021-10-27 DIAGNOSIS — K766 Portal hypertension: Secondary | ICD-10-CM | POA: Diagnosis not present

## 2021-10-27 DIAGNOSIS — J90 Pleural effusion, not elsewhere classified: Secondary | ICD-10-CM

## 2021-10-27 HISTORY — PX: ESOPHAGEAL BANDING: SHX5518

## 2021-10-27 HISTORY — PX: ESOPHAGOGASTRODUODENOSCOPY (EGD) WITH PROPOFOL: SHX5813

## 2021-10-27 LAB — CBC
HCT: 29.3 % — ABNORMAL LOW (ref 36.0–46.0)
Hemoglobin: 9.1 g/dL — ABNORMAL LOW (ref 12.0–15.0)
MCH: 25.3 pg — ABNORMAL LOW (ref 26.0–34.0)
MCHC: 31.1 g/dL (ref 30.0–36.0)
MCV: 81.6 fL (ref 80.0–100.0)
Platelets: 60 10*3/uL — ABNORMAL LOW (ref 150–400)
RBC: 3.59 MIL/uL — ABNORMAL LOW (ref 3.87–5.11)
RDW: 19 % — ABNORMAL HIGH (ref 11.5–15.5)
WBC: 8.5 10*3/uL (ref 4.0–10.5)
nRBC: 0 % (ref 0.0–0.2)

## 2021-10-27 LAB — CBG MONITORING, ED
Glucose-Capillary: 104 mg/dL — ABNORMAL HIGH (ref 70–99)
Glucose-Capillary: 91 mg/dL (ref 70–99)

## 2021-10-27 LAB — COMPREHENSIVE METABOLIC PANEL
ALT: 17 U/L (ref 0–44)
AST: 31 U/L (ref 15–41)
Albumin: 2 g/dL — ABNORMAL LOW (ref 3.5–5.0)
Alkaline Phosphatase: 45 U/L (ref 38–126)
Anion gap: 3 — ABNORMAL LOW (ref 5–15)
BUN: 14 mg/dL (ref 6–20)
CO2: 20 mmol/L — ABNORMAL LOW (ref 22–32)
Calcium: 6.3 mg/dL — CL (ref 8.9–10.3)
Chloride: 116 mmol/L — ABNORMAL HIGH (ref 98–111)
Creatinine, Ser: 0.54 mg/dL (ref 0.44–1.00)
GFR, Estimated: >60 mL/min (ref >60)
Glucose, Bld: 119 mg/dL — ABNORMAL HIGH (ref 70–99)
Potassium: 4.3 mmol/L (ref 3.5–5.1)
Sodium: 139 mmol/L (ref 135–145)
Total Bilirubin: 1.7 mg/dL — ABNORMAL HIGH (ref 0.3–1.2)
Total Protein: 4.7 g/dL — ABNORMAL LOW (ref 6.5–8.1)

## 2021-10-27 LAB — HEMOGLOBIN AND HEMATOCRIT, BLOOD
HCT: 30.3 % — ABNORMAL LOW (ref 36.0–46.0)
HCT: 28.9 % — ABNORMAL LOW (ref 36.0–46.0)
Hemoglobin: 9.5 g/dL — ABNORMAL LOW (ref 12.0–15.0)
Hemoglobin: 9 g/dL — ABNORMAL LOW (ref 12.0–15.0)

## 2021-10-27 LAB — GLUCOSE, CAPILLARY: Glucose-Capillary: 77 mg/dL (ref 70–99)

## 2021-10-27 LAB — PROTIME-INR
INR: 1.8 — ABNORMAL HIGH (ref 0.8–1.2)
Prothrombin Time: 21.3 seconds — ABNORMAL HIGH (ref 11.4–15.2)

## 2021-10-27 SURGERY — ESOPHAGOGASTRODUODENOSCOPY (EGD) WITH PROPOFOL
Anesthesia: Monitor Anesthesia Care

## 2021-10-27 MED ORDER — FENTANYL CITRATE (PF) 100 MCG/2ML IJ SOLN
INTRAMUSCULAR | Status: DC | PRN
  Administered 2021-10-27: 50 ug via INTRAVENOUS

## 2021-10-27 MED ORDER — ONDANSETRON HCL 4 MG/2ML IJ SOLN
INTRAMUSCULAR | Status: DC | PRN
  Administered 2021-10-27: 4 mg via INTRAVENOUS

## 2021-10-27 MED ORDER — PHENYLEPHRINE 40 MCG/ML (10ML) SYRINGE FOR IV PUSH (FOR BLOOD PRESSURE SUPPORT)
PREFILLED_SYRINGE | INTRAVENOUS | Status: DC | PRN
  Administered 2021-10-27 (×4): 80 ug via INTRAVENOUS

## 2021-10-27 MED ORDER — HYDROCODONE-ACETAMINOPHEN 5-325 MG PO TABS
ORAL_TABLET | ORAL | Status: AC
  Administered 2021-10-27: 1 via ORAL
  Filled 2021-10-27: qty 1

## 2021-10-27 MED ORDER — ORAL CARE MOUTH RINSE
15.0000 mL | Freq: Two times a day (BID) | OROMUCOSAL | Status: DC
  Administered 2021-10-27 – 2021-11-02 (×11): 15 mL via OROMUCOSAL

## 2021-10-27 MED ORDER — LIDOCAINE 2% (20 MG/ML) 5 ML SYRINGE
INTRAMUSCULAR | Status: DC | PRN
  Administered 2021-10-27: 40 mg via INTRAVENOUS

## 2021-10-27 MED ORDER — SODIUM CHLORIDE 0.9 % IV SOLN
INTRAVENOUS | Status: AC
  Filled 2021-10-27: qty 20

## 2021-10-27 MED ORDER — MORPHINE SULFATE (PF) 2 MG/ML IV SOLN
INTRAVENOUS | Status: AC
  Filled 2021-10-27: qty 1

## 2021-10-27 MED ORDER — ONDANSETRON HCL 4 MG/2ML IJ SOLN
4.0000 mg | Freq: Four times a day (QID) | INTRAMUSCULAR | Status: DC | PRN
  Administered 2021-10-27 – 2021-10-28 (×3): 4 mg via INTRAVENOUS
  Filled 2021-10-27: qty 2

## 2021-10-27 MED ORDER — MORPHINE SULFATE (PF) 2 MG/ML IV SOLN
2.0000 mg | INTRAVENOUS | Status: DC | PRN
  Administered 2021-10-27 – 2021-10-29 (×3): 2 mg via INTRAVENOUS
  Filled 2021-10-27 (×2): qty 1

## 2021-10-27 MED ORDER — PANTOPRAZOLE SODIUM 40 MG PO TBEC
40.0000 mg | DELAYED_RELEASE_TABLET | Freq: Every day | ORAL | Status: DC
  Administered 2021-10-27 – 2021-11-02 (×7): 40 mg via ORAL
  Filled 2021-10-27 (×6): qty 1

## 2021-10-27 MED ORDER — PROPOFOL 10 MG/ML IV BOLUS
INTRAVENOUS | Status: AC
  Filled 2021-10-27: qty 20

## 2021-10-27 MED ORDER — NICOTINE 21 MG/24HR TD PT24
21.0000 mg | MEDICATED_PATCH | Freq: Every day | TRANSDERMAL | Status: DC
  Administered 2021-10-27 – 2021-11-02 (×7): 21 mg via TRANSDERMAL
  Filled 2021-10-27 (×6): qty 1

## 2021-10-27 MED ORDER — PROPOFOL 10 MG/ML IV BOLUS
INTRAVENOUS | Status: DC | PRN
  Administered 2021-10-27: 140 mg via INTRAVENOUS

## 2021-10-27 MED ORDER — ONDANSETRON HCL 4 MG/2ML IJ SOLN
INTRAMUSCULAR | Status: AC
  Filled 2021-10-27: qty 2

## 2021-10-27 MED ORDER — TAMOXIFEN CITRATE 10 MG PO TABS
20.0000 mg | ORAL_TABLET | Freq: Every day | ORAL | Status: DC
  Administered 2021-10-27 – 2021-11-02 (×7): 20 mg via ORAL
  Filled 2021-10-27 (×10): qty 2

## 2021-10-27 MED ORDER — PANTOPRAZOLE SODIUM 40 MG PO TBEC
DELAYED_RELEASE_TABLET | ORAL | Status: AC
  Filled 2021-10-27: qty 1

## 2021-10-27 MED ORDER — PHENYLEPHRINE HCL-NACL 20-0.9 MG/250ML-% IV SOLN
INTRAVENOUS | Status: DC | PRN
  Administered 2021-10-27: 20 ug/min via INTRAVENOUS

## 2021-10-27 MED ORDER — HYDROCODONE-ACETAMINOPHEN 5-325 MG PO TABS
1.0000 | ORAL_TABLET | ORAL | Status: DC | PRN
  Administered 2021-10-27 – 2021-10-29 (×7): 1 via ORAL
  Filled 2021-10-27 (×6): qty 1

## 2021-10-27 MED ORDER — SODIUM CHLORIDE 0.9% IV SOLUTION: Freq: Once | INTRAVENOUS | Status: AC

## 2021-10-27 MED ORDER — FENTANYL CITRATE (PF) 100 MCG/2ML IJ SOLN
INTRAMUSCULAR | Status: AC
  Filled 2021-10-27: qty 2

## 2021-10-27 MED ORDER — NICOTINE 21 MG/24HR TD PT24
MEDICATED_PATCH | TRANSDERMAL | Status: AC
  Filled 2021-10-27: qty 1

## 2021-10-27 MED ORDER — CHLORHEXIDINE GLUCONATE CLOTH 2 % EX PADS
6.0000 | MEDICATED_PAD | Freq: Every day | CUTANEOUS | Status: DC
  Administered 2021-10-27 – 2021-10-29 (×2): 6 via TOPICAL

## 2021-10-27 MED ORDER — SUCCINYLCHOLINE CHLORIDE 200 MG/10ML IV SOSY
PREFILLED_SYRINGE | INTRAVENOUS | Status: DC | PRN
  Administered 2021-10-27: 100 mg via INTRAVENOUS

## 2021-10-27 MED ORDER — ROCURONIUM BROMIDE 10 MG/ML (PF) SYRINGE
PREFILLED_SYRINGE | INTRAVENOUS | Status: DC | PRN
  Administered 2021-10-27: 30 mg via INTRAVENOUS

## 2021-10-27 MED ORDER — ONDANSETRON HCL 4 MG/2ML IJ SOLN
4.0000 mg | Freq: Four times a day (QID) | INTRAMUSCULAR | Status: DC | PRN
  Administered 2021-10-27: 4 mg via INTRAVENOUS
  Filled 2021-10-27: qty 2

## 2021-10-27 MED ORDER — LACTATED RINGERS IV SOLN: INTRAVENOUS | Status: DC | PRN

## 2021-10-27 MED ORDER — SUGAMMADEX SODIUM 200 MG/2ML IV SOLN
INTRAVENOUS | Status: DC | PRN
  Administered 2021-10-27: 100 mg via INTRAVENOUS

## 2021-10-27 SURGICAL SUPPLY — 14 items

## 2021-10-27 NOTE — Anesthesia Postprocedure Evaluation (Signed)
Anesthesia Post Note  Patient: Theresa Rocha  Procedure(s) Performed: ESOPHAGOGASTRODUODENOSCOPY (EGD) WITH PROPOFOL ESOPHAGEAL BANDING     Patient location during evaluation: PACU Anesthesia Type: General Level of consciousness: awake Pain management: pain level controlled Vital Signs Assessment: post-procedure vital signs reviewed and stable Respiratory status: spontaneous breathing Cardiovascular status: stable Postop Assessment: no apparent nausea or vomiting Anesthetic complications: no   No notable events documented.  Last Vitals:  Vitals:   10/27/21 1200 10/27/21 1201  BP:  111/66  Pulse: 89 89  Resp: (!) 24 (!) 24  Temp:    SpO2: 99% 99%    Last Pain:  Vitals:   10/27/21 1156  TempSrc: Axillary  PainSc:                  Duyen Beckom

## 2021-10-27 NOTE — Assessment & Plan Note (Addendum)
Thoracentesis 11/28 by IR Post procedure CXR with trace apical PTX  Reportedly she states that her hepatologist suspects that the recurrent effusions have something to do the the ablation of the hepatocellular cancer Recurrent thoracentesis done 11/30 by pulm, appreciate assistance CXR 12/2 with moderate L effusion, mild bilateral interstitial opacities Labs reviewed from 11/2, suspected transudate Appreciate pulm assistance, follow repeat pleural fluid studies with cytology (reactive mesothelial cells) (looks c/w transudate) - may need to consider TIPS if rapidly reaccumulating  Continue lasix (with midodrine +/- albumin with hypotension) Echo with normal EF, RVSF normal, moderate L effusion) RUQ [place] with patent portal vein, gallbladder wall thickening, cirrhosis with 2 hepatic lesions, Right effusion

## 2021-10-27 NOTE — Assessment & Plan Note (Addendum)
Chronic with cirrhosis

## 2021-10-27 NOTE — ED Notes (Signed)
Critical care came to see the patient. She advised she wanted to 2nd unit of blood transfused within an hour.

## 2021-10-27 NOTE — Consult Note (Addendum)
Consultation  Referring Provider: No ref. provider found Primary Care Physician:  Seward Carol, MD Primary Gastroenterologist:  Dr.Kayann Maj  Reason for Consultation:  GI bleed/Cirrhosis  HPI: Theresa Rocha is a 55 y.o. female, known to Dr. 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  She also has confirmed hepatocellular carcinoma which has progressed.  She is being followed by [facility], and has undergone recent ablation of the dominant hepatic lesion in the left lobe about 1 month ago per Dr. Kathlene Cote.  She has a second 2.5 cm lesion in the right lobe of the liver with anticipation for ablation within the next month.  CT yesterday also confirmed several smaller hepatic lesions.   Patient had also been diagnosed with breast cancer in 2020, she had a lumpectomy, completed radiation therapy. She had last been seen in our office in December 2020.  Last EGD September 2020 with finding of 1+ varices in the lower third of the esophagus and mild diffuse portal gastropathy, no gastric varices. She has developed left pleural effusion recently and has been requiring thoracentesis.  She was actually scheduled to have a thoracentesis yesterday. She says she woke up yesterday morning and had 2-3 episodes of black melenic stool.  She went on to work, began feeling nauseated, and then vomited a large amount of red blood in the commode.  She came to the emergency room where she had a second and third episode of gross hematemesis.  She is not had any further active vomiting since then. She did have 1 large melenic stool earlier this morning. She has been started on octreotide, PPI infusion, is receiving 1 unit of FFP and had a believe 2 units of packed RBCs last night. Hemoglobin on presentation 8.3 (down from a hemoglobin of 8.54-month ago).  Post transfusions hemoglobin is 9.1 hematocrit 27.3 this morning  Platelets 124 Pro time 21/INR 1.8 Sodium 137/BUN  15/creatinine 0.53 T bili 1.6/AST 44 AFP pending  She is hemodynamically stable this morning, blood pressure 110/86 pulse in the 70s  No prior variceal hemorrhage  CT abdomen pelvis yesterday-moderate left pleural effusion with adjacent atelectasis decreased compared to recent chest CT on 09/30/2021 Mild thickening of the walls of the entire colon and distal small bowel Prominent esophageal varices at the level of the lower esophagus incompletely imaged, cirrhotic liver, known neoplastic lesions in the liver, for centimeter lesion in the left lobe of the liver, additional hypodense lesion within the posterior right liver lobe measuring 2.5 cm compatible with another known neoplastic lesion likely enlarged compared to MRI from 07/17/2021, additional smaller lesions scattered within the liver.   Past Medical History:  Diagnosis Date   Anemia    Breast cancer ([place]) 08/30/2019   Cancer ([place])    liver- just diagnosed, not started treatment yet   Cervicalgia    COPD (chronic obstructive pulmonary disease) (HCC)    Esophageal varices (HCC)    GERD (gastroesophageal reflux disease)    Headache    Hepatic cirrhosis (HCC)    Hepatitis C    resolved 2018   Hepatocellular carcinoma (HCC)    IBS (irritable bowel syndrome)    Lactose intolerance    Personal history of radiation therapy 11/2019   Right breast   Portal hypertension ([place])    Raynaud's syndrome    Thrombocytopenia ([place])     Past Surgical History:  Procedure Laterality Date   BREAST BIOPSY Right 09/2019   BREAST LUMPECTOMY Right 09/2019   BREAST LUMPECTOMY  WITH AXILLARY LYMPH NODE BIOPSY Right 10/16/2019   Procedure: RIGHT BREAST LUMPECTOMY WITH SENTINEL LYMPH NODE BIOPSY;  Surgeon: Stark Klein, MD;  Location: [place];  Service: General;  Laterality: Right;   COLONOSCOPY     HAMMER TOE SURGERY Left 2012   pins placed in great toe, 2nd,3rd, 4th toes, all pins removed except great toe   IR PARACENTESIS  11/13/2019   IR  RADIOLOGIST EVAL & MGMT  08/16/2019   IR RADIOLOGIST EVAL & MGMT  02/06/2020   IR RADIOLOGIST EVAL & MGMT  06/26/2020   IR RADIOLOGIST EVAL & MGMT  12/31/2020   IR RADIOLOGIST EVAL & MGMT  07/21/2021   IR RADIOLOGIST EVAL & MGMT  10/08/2021   IR THORACENTESIS ASP PLEURAL SPACE W/IMG GUIDE  10/12/2021   IR THORACENTESIS ASP PLEURAL SPACE W/IMG GUIDE  10/26/2021   PELVIC LAPAROSCOPY Bilateral 1992   RADIOLOGY WITH ANESTHESIA N/A 09/16/2021   Procedure: CT MICROWAVE ABLATION;  Surgeon: Aletta Edouard, MD;  Location: [facility];  Service: Radiology;  Laterality: N/A;   UPPER GASTROINTESTINAL ENDOSCOPY  2002   had esophageal dilitation    Prior to Admission medications   Medication Sig Start Date End Date Taking? Authorizing Provider  dicyclomine (BENTYL) 20 MG tablet Take one tablet every 4-6 hours as needed for abdominal cramping Patient taking differently: Take 20 mg by mouth every 4 (four) hours as needed (abdominal cramping). 11/21/19  Yes Irene Shipper, MD  ibuprofen (ADVIL) 200 MG tablet Take 400 mg by mouth every 6 (six) hours as needed for mild pain.   Yes [provider]  loperamide (IMODIUM) 2 MG capsule Take 4 mg by mouth every other day.   Yes [provider]  nicotine (NICODERM CQ - DOSED IN MG/24 HOURS) 21 mg/24hr patch Place 21 mg onto the skin daily as needed (smoking cessation on work days).   Yes [provider]  OVER THE COUNTER MEDICATION Take 2 tablets by mouth every morning. Over the counter acid reducer   Yes [provider]  promethazine (PHENERGAN) 25 MG tablet Take 1 tablet (25 mg total) by mouth every 8 (eight) hours as needed for nausea or vomiting. 05/25/21  Yes Edrick Kins, DPM  spironolactone (ALDACTONE) 50 MG tablet Take 50 mg by mouth every morning. 10/09/21  Yes [provider]  tamoxifen (NOLVADEX) 20 MG tablet TAKE 1 TABLET BY MOUTH EVERY DAY Patient taking differently: Take 20 mg by mouth every morning. 10/13/20  Yes  Truitt Merle, MD  pantoprazole (PROTONIX) 40 MG tablet Take 1 tablet (40 mg total) by mouth daily. Office visit for future refills Patient not taking: Reported on 10/26/2021 06/16/21   Irene Shipper, MD    Current Facility-Administered Medications  Medication Dose Route Frequency Provider Last Rate Last Admin   0.9 %  sodium chloride infusion   Intravenous Continuous Rise Patience, MD 150 mL/hr at 10/27/21 0337 New Bag at 10/27/21 0337   cefTRIAXone (ROCEPHIN) 2 g in sodium chloride 0.9 % 100 mL IVPB  2 g Intravenous Q24H Rise Patience, MD   Stopped at 10/27/21 0106   octreotide (SANDOSTATIN) 500 mcg in sodium chloride 0.9 % 250 mL (2 mcg/mL) infusion  50 mcg/hr Intravenous Continuous Rise Patience, MD   Stopped at 10/27/21 0634   [START ON 10/30/2021] pantoprazole (PROTONIX) injection 40 mg  40 mg Intravenous Q12H Rise Patience, MD       pantoprozole (PROTONIX) 80 mg /NS 100 mL infusion  8 mg/hr  Intravenous Continuous Rise Patience, MD 10 mL/hr at 10/27/21 0529 8 mg/hr at 10/27/21 0258   Current Outpatient Medications  Medication Sig Dispense Refill   dicyclomine (BENTYL) 20 MG tablet Take one tablet every 4-6 hours as needed for abdominal cramping (Patient taking differently: Take 20 mg by mouth every 4 (four) hours as needed (abdominal cramping).) 90 tablet 3   ibuprofen (ADVIL) 200 MG tablet Take 400 mg by mouth every 6 (six) hours as needed for mild pain.     loperamide (IMODIUM) 2 MG capsule Take 4 mg by mouth every other day.     nicotine (NICODERM CQ - DOSED IN MG/24 HOURS) 21 mg/24hr patch Place 21 mg onto the skin daily as needed (smoking cessation on work days).     OVER THE COUNTER MEDICATION Take 2 tablets by mouth every morning. Over the counter acid reducer     promethazine (PHENERGAN) 25 MG tablet Take 1 tablet (25 mg total) by mouth every 8 (eight) hours as needed for nausea or vomiting. 20 tablet 0   spironolactone (ALDACTONE) 50 MG tablet Take 50  mg by mouth every morning.     tamoxifen (NOLVADEX) 20 MG tablet TAKE 1 TABLET BY MOUTH EVERY DAY (Patient taking differently: Take 20 mg by mouth every morning.) 90 tablet 1   pantoprazole (PROTONIX) 40 MG tablet Take 1 tablet (40 mg total) by mouth daily. Office visit for future refills (Patient not taking: Reported on 10/26/2021) 90 tablet 0    Allergies as of 10/26/2021 - Review Complete 10/26/2021  Allergen Reaction Noted   Hydromorphone hcl Nausea And Vomiting    Aspirin Nausea Only     Family History  Problem Relation Age of Onset   Liver cancer Mother    Lung cancer Mother    Lung cancer Father    Liver cancer Maternal Grandmother    Colon cancer Neg Hx    Esophageal cancer Neg Hx    Stomach cancer Neg Hx    Rectal cancer Neg Hx     Social History   Socioeconomic History   Marital status: Married    Spouse name: Not on file   Number of children: Not on file   Years of education: Not on file   Highest education level: Not on file  Occupational History   Not on file  Tobacco Use   Smoking status: Every Day    Packs/day: 0.50    Years: 30.00    Pack years: 15.00    Types: Cigarettes    Start date: 11/29/1978   Smokeless tobacco: Never  Vaping Use   Vaping Use: Never used  Substance and Sexual Activity   Alcohol use: Not Currently    Alcohol/week: 6.0 standard drinks    Types: 6 Cans of beer per week    Comment: stopped 06/2019   Drug use: No   Sexual activity: Not Currently    Partners: Male  Other Topics Concern   Not on file  Social History Narrative   Not on file   Social Determinants of Health   Financial Resource Strain: Not on file  Food Insecurity: Not on file  Transportation Needs: Not on file  Physical Activity: Not on file  Stress: Not on file  Social Connections: Not on file  Intimate Partner Violence: Not on file    Review of Systems: Pertinent positive and negative review of systems were noted in the above HPI section.  All other  review of systems was otherwise negative.  Physical Exam: Vital signs in last 24 hours: Temp:  [97.7 F (36.5 C)-98.3 F (36.8 C)] 97.9 F (36.6 C) (11/29 0654) Pulse Rate:  [80-122] 87 (11/29 0654) Resp:  [14-26] 16 (11/29 0654) BP: (82-108)/(53-87) 100/74 (11/29 0654) SpO2:  [90 %-100 %] 100 % (11/29 0654) Weight:  [49.9 kg] 49.9 kg (11/28 1643)   General:   Alert,  Well-developed, well-nourished, older white female pleasant and cooperative in NAD Head:  Normocephalic and atraumatic. Eyes:  Sclera clear, no icterus.   Conjunctiva pale Ears:  Normal auditory acuity. Nose:  No deformity, discharge,  or lesions. Mouth:  No deformity or lesions.   Neck:  Supple; no masses or thyromegaly. Lungs:  Clear throughout to auscultation.   No wheezes, crackles, or rhonchi.  Heart:  Regular rate and rhythm; no murmurs, clicks, rubs,  or gallops. Abdomen:  Soft, no appreciable ascites, nontender BS active,nonpalp mass or hsm.   Rectal: Not done Msk:  Symmetrical without gross deformities. . Pulses:  Normal pulses noted. Extremities:  Without clubbing or edema. Neurologic:  Alert and  oriented x4;  grossly normal neurologically..  Mentating well, no asterixis Skin:  Intact without significant lesions or rashes.. Psych:  Alert and cooperative. Normal mood and affect.  Intake/Output from previous day: 11/28 0701 - 11/29 0700 In: 2481 [Blood:1381; IV Piggyback:1100] Out: -  Intake/Output this shift: No intake/output data recorded.  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 Continue octreotide infusion Continue PPI infusion Transfuse to keep hemoglobin 8 Continue ceftriaxone Patient has been scheduled for EGD with Dr. Henrene Pastor at this morning Procedure discussed in detail with the patient including indications risks and benefits and she is agreeable to proceed. Once acute bleed has been treated/resolved, will need further evaluation and management of what appears to be progressive hepatocellular carcinoma.  GI will follow  Amy Esterwood PA-C 10/27/2021, 8:31 AM  GI ATTENDING  History, laboratories, x-rays, prior endoscopy report reviewed.  Patient personally seen and examined.  Agree with comprehensive consultation note as outlined above.  Patient with advanced liver disease liver cancer and history of breast cancer resents with acute upper GI bleeding.  Principal concern is variceal bleeding.  She is now for urgent upper endoscopy.  Patient is  high risk given her comorbidities.The nature of the procedure, as well as the risks, benefits, and alternatives were carefully and thoroughly reviewed with the patient. Ample time for discussion and questions allowed. The patient understood, was  satisfied, and agreed to proceed.  Docia Chuck. Geri Seminole., M.D. [facility] Division of Gastroenterology

## 2021-10-27 NOTE — Assessment & Plan Note (Addendum)
EGD 11/29 with recent upper GI bleed 2/2 esophageal varices s/p band ligation, incidental distal esophageal ring, mild pyloric inflammation On ceftriaxone, octreotide complete, PPI 40 mg PO daily, bid carafate slurry, will need follow up EGD with band ligation in 4 weeks.  Per GI, needs to follow up in about 2 weeks, repeat banding in 4 weeks.

## 2021-10-27 NOTE — ED Notes (Signed)
Up to the bathroom with assistance

## 2021-10-27 NOTE — Assessment & Plan Note (Addendum)
thought to be due to ETOH and Hep C Followed by [facility]

## 2021-10-27 NOTE — Op Note (Signed)
[facility] Patient Name: Theresa Rocha Procedure Date: 10/27/2021 MRN: [id number] Attending MD: Docia Chuck. Henrene Pastor , MD Date of Birth: 1966/01/18 CSN: [id number] Age: 55 Admit Type: Outpatient Procedure:                Upper GI endoscopy with band ligation of esophageal                            varices x 3 Indications:              Hematemesis, Melena Providers:                Docia Chuck. Henrene Pastor, MD, Particia Nearing, RN, Tyna Jaksch                            Technician Referring MD:             [facility] Medicines:                Monitored Anesthesia Care Complications:            No immediate complications. Estimated Blood Loss:     Estimated blood loss: none. Procedure:                Pre-Anesthesia Assessment:                           - Prior to the procedure, a History and Physical                            was performed, and patient medications and                            allergies were reviewed. The patient's tolerance of                            previous anesthesia was also reviewed. The risks                            and benefits of the procedure and the sedation                            options and risks were discussed with the patient.                            All questions were answered, and informed consent                            was obtained. Prior Anticoagulants: The patient has                            taken no previous anticoagulant or antiplatelet                            agents. ASA Grade Assessment: III - A patient with  severe systemic disease. After reviewing the risks                            and benefits, the patient was deemed in                            satisfactory condition to undergo the procedure.                           After obtaining informed consent, the endoscope was                            passed under direct vision. Throughout the                            procedure, the  patient's blood pressure, pulse, and                            oxygen saturations were monitored continuously. The                            [id number]-[id number] ([id number]) Olympus endoscope was introduced                            through the mouth, and advanced to the second part                            of duodenum. The upper GI endoscopy was                            accomplished without difficulty. The patient                            tolerated the procedure well. Scope In: Scope Out: Findings:      There were 3 columns of grade II varices were found in the lower third       of the esophagus. One of the varix was slightly more prominent and had       stigmata (red pigmentation/nipple). There was no active bleeding. After       completing the entire endoscopic survey, Three bands were successfully       placed on the high risk varix with incomplete eradication of varices       overall. There was no bleeding during the procedure.      There was an incidental ringlike esophageal stricture at the       gastroesophageal junction. There was mild inflammation in the pyloric       region. The stomach was otherwise normal with no evidence of proximal       gastric varices or significant portal hypertensive gastropathy.      The examined duodenum was normal.      The cardia and gastric fundus were normal on retroflexion save small       hiatal hernia. Impression:               1. Recent upper GI bleed secondary to esophageal  varices status post band ligation                           2. Incidental distal esophageal ring                           3. Mild pyloric inflammation                           4. Otherwise normal EGD. Moderate Sedation:      none Recommendation:           1. Patient will return to the hospital ward for                            ongoing care and observation                           2. Continue IV octreotide for now                           3.  Convert pantoprazole to 40 mg p.o. daily                           4. Continue to monitor stools and hemoglobin                           5. Clear liquid diet today                           6. Will need follow-up EGD with band ligation in                            about 4 weeks                           . Procedure Code(s):        --- Professional ---                           [phone], Esophagogastroduodenoscopy, flexible,                            transoral; with band ligation of esophageal/gastric                            varices Diagnosis Code(s):        --- Professional ---                           I85.00, Esophageal varices without bleeding                           K92.0, Hematemesis                           K92.1, Melena (includes Hematochezia) CPT copyright 2019 American Medical Association. All rights reserved. The codes documented in this report are preliminary and upon  coder review may  be revised to meet current compliance requirements. Docia Chuck. Henrene Pastor, MD 10/27/2021 11:49:39 AM This report has been signed electronically. Number of Addenda: 0

## 2021-10-27 NOTE — Assessment & Plan Note (Addendum)
Diagnosed in 2020 Status postlumpectomy and radiation

## 2021-10-27 NOTE — Progress Notes (Signed)
[facility] Progress Note  Patient: Theresa Rocha     HKV:[id number]  DOA: 10/26/2021      Date of Service: the patient was seen and examined on 10/27/2021  Brief hospital course: 55-year-old female with this is a 55 year old female with this of the liver secondary to hepatitis C and alcohol use, breast cancer who presents to the hospital for bloody vomitus.  She had at least 2 episodes of vomiting large amount of blood and admitted to black tarry stools the day before.  She felt lightheaded and also had some abdominal cramping. Of note the patient had a thoracentesis for 700 cc of hazy yellow fluid was removed from the left pleural space (done the same morning).  Was found to have a trace apical pneumothorax after the procedure.  Subjective: she has pain in epigastrium and feel like she could use some pain medications.   Assessment and Plan: * Secondary esophageal varices with bleeding ([place]) - The patient underwent an EGD today which reveals 3 columns of grade 2 varices in the lower third of the esophagus.  One of the varix was slightly more prominent and had stigmata of recent bleeding.  3 bands were placed on the high risk varices.  Also noted was an incidental ringlike esophagus stricture at the GE junction and mild inflammation in the pyloric region. -It is recommended to continue IV octreotide for now in addition to daily oral Protonix and start a clear liquid diet - Continue to follow for ongoing bleeding - control pain with IV Morphine and Oral Hydrocodone  Acute posthemorrhagic anemia - The patient has received 2 units of packed red blood cells on 11/28 and 1 unit of FFP early morning of 11/29. - Continue to follow hemoglobin and platelets  Cirrhosis ([place]) - thought to be due to ETOH and Hep C  Pleural effusion, left - Underwent thoracentesis on 11/28- this was her third one - she states that her hepatologist suspects that the recurrent effusions have something to do the  the ablation of the hepatocellular cancer - repeat chest x-ray shows she has a small amount of fluid remaining -Labs reviewed, fluid appears to be transudative -She had a very tiny pneumothorax after the procedure which, according to the x-ray obtained yesterday evening, has resolved  Thrombocytopenia ([place]) -chronic  Malignant neoplasm of central portion of right breast in female, estrogen receptor positive ([place]) - Diagnosed in 2020 - Status postlumpectomy and radiation  Hepatocellular carcinoma ([place]) - Per GI notes, this is progressing - She is followed by [facility] and underwent an ablation of the dominant hepatic lesion in the left lobe about 1 month ago - She has a second 2.5 cm lesion in the right lobe of the liver with a plan for ablation within the next month - In addition she has several smaller hepatic lesions    Body mass index is 19.49 kg/m.        Time spent in minutes: 35 DVT prophylaxis: SCDs Start: 10/26/21 2238  Code Status: Full code Level of Care: Level of care: Stepdown Disposition Plan:  Status is: Inpatient  Remains inpatient appropriate because: following GI bleed  Objective:   Vitals:   10/27/21 1200 10/27/21 1201 10/27/21 1444 10/27/21 1507  BP:  111/66 100/64 133/80  Pulse: 89 89 90 (!) 101  Resp: (!) 24 (!) 24 (!) 22 (!) 28  Temp:   98 F (36.7 C) 98.3 F (36.8 C)  TempSrc:   Oral Oral  SpO2: 99% 99%  98% 95%  Weight:      Height:       Filed Weights   10/26/21 1643 10/27/21 1035  Weight: 49.9 kg 49.9 kg    Exam: General exam: Appears comfortable  HEENT: PERRLA, oral mucosa moist, no sclera icterus or thrush Respiratory system: Clear to auscultation. Respiratory effort normal. Cardiovascular system: S1 & S2 heard, regular rate and rhythm Gastrointestinal system: Abdomen soft, non-tender, nondistended. Normal bowel sounds   Central nervous system: Alert and oriented. No focal neurological deficits. Extremities: No cyanosis,  clubbing or edema Skin: No rashes or ulcers Psychiatry:  Mood & affect appropriate.     Data Reviewed:  CBC: Recent Labs  Lab 10/26/21 1757 10/27/21 0429  WBC 9.6 8.5  HGB 8.3* 9.1*  HCT 28.9* 29.3*  MCV 79.0* 81.6  PLT 124* 60*   Basic Metabolic Panel: Recent Labs  Lab 10/26/21 1757 10/27/21 0429  NA 137 139  K 4.3 4.3  CL 108 116*  CO2 22 20*  GLUCOSE 98 119*  BUN 15 14  CREATININE 0.53 0.54  CALCIUM 7.8* 6.3*   GFR: Estimated Creatinine Clearance: 63.3 mL/min (by C-G formula based on SCr of 0.54 mg/dL).  Time spent: 35 minutes.   Author: Debbe Odea  10/27/2021 3:28 PM  To reach On-call, see care teams to locate the attending and reach out via [email or web address]. Between 7PM-7AM, please contact night-coverage If you still have difficulty reaching the attending provider, please page the [facility] (Director on Call) for [facility] on amion for assistance.

## 2021-10-27 NOTE — Consult Note (Signed)
NAME:  Theresa Rocha, MRN:  [id number], DOB:  [birth year], LOS: 1 ADMISSION DATE:  10/26/2021, CONSULTATION DATE:  10/26/21 REFERRING MD:  Rogene Houston CHIEF COMPLAINT:  hematemesis  History of Present Illness:   55 yo woman with hx of cirrhosis presenting to ED with hematemesis.   2 episodes of hematemesis today,  Frequent bouts of black tarry stool since yesterday.  2 episodes of hematemesis in ED.   BP typically 342 systolic.   S/p thoracentesis today. 700 cc hazy yellow fluid. - transudate Trace apical PTX noted.  Now resolved.  Pertinent  Medical History  Reported hx of esophageal varices.  Pleural effusion fE deficiency anemia Throbmocytopenia Breast cancer (on tamoxifen) Hepatocellular carcinoma Cirrhosis Chronic hep C  GERD ETOH hepatitis (?)  Significant Hospital Events: Including procedures, antibiotic start and stop dates in addition to other pertinent events     Interim History / Subjective:    Objective   Blood pressure 93/60, pulse (!) 102, temperature 98 F (36.7 C), temperature source Oral, resp. rate 18, height 5\' 3"  (1.6 m), weight 49.9 kg, last menstrual period 04/20/2013, SpO2 90 %.        Intake/Output Summary (Last 24 hours) at 10/27/2021 0101 Last data filed at 10/26/2021 2300 Gross per 24 hour  Intake 1130 ml  Output --  Net 1130 ml   Filed Weights   10/26/21 1643  Weight: 49.9 kg    Examination: General: NAD pleasant HENT: ncat Lungs: ctab Cardiovascular: rrr Abdomen: nt, nd, nbs Extremities: no edema Neuro: alerta nd oriented  GU  Resolved Hospital Problem list     Assessment & Plan:  55 yo woman with cirrhosis, hx esoph varices, here with hematemesis, hypotension.  Variceal bleed likely. GI to officially see in AM.  BP improved with fluids, PRBC, awaiting FFP.  Patient appears stable for Stepdown unit status.  Please call us immediately if she deteriorates, shows signs of poor perfusion, or experiencing more hematemesis in  the setting of refractory low blood pressure.   Labs   CBC: Recent Labs  Lab 10/26/21 1757  WBC 9.6  HGB 8.3*  HCT 28.9*  MCV 79.0*  PLT 124*    Basic Metabolic Panel: Recent Labs  Lab 10/26/21 1757  NA 137  K 4.3  CL 108  CO2 22  GLUCOSE 98  BUN 15  CREATININE 0.53  CALCIUM 7.8*   GFR: Estimated Creatinine Clearance: 63.3 mL/min (by C-G formula based on SCr of 0.53 mg/dL). Recent Labs  Lab 10/26/21 1757  WBC 9.6    Liver Function Tests: Recent Labs  Lab 10/26/21 1757  AST 44*  ALT 23  ALKPHOS 56  BILITOT 1.6*  PROT 6.6  ALBUMIN 2.8*   Recent Labs  Lab 10/26/21 1757  LIPASE 38   No results for input(s): AMMONIA in the last 168 hours.  ABG No results found for: PHART, PCO2ART, PO2ART, HCO3, TCO2, ACIDBASEDEF, O2SAT   Coagulation Profile: Recent Labs  Lab 10/26/21 2001  INR 1.8*    Cardiac Enzymes: No results for input(s): CKTOTAL, CKMB, CKMBINDEX, TROPONINI in the last 168 hours.  HbA1C: No results found for: HGBA1C  CBG: Recent Labs  Lab 10/27/21 0025  GLUCAP 104*    Review of Systems:   Negative 12 pt ros  Past Medical History:  She,  has a past medical history of Anemia, Breast cancer ([place]) (08/30/2019), Cancer ([place]), Cervicalgia, COPD (chronic obstructive pulmonary disease) ([place]), Esophageal varices ([place]), GERD (gastroesophageal reflux disease), Headache, Hepatic cirrhosis ([place]), Hepatitis C,  Hepatocellular carcinoma ([place]), IBS (irritable bowel syndrome), Lactose intolerance, Personal history of radiation therapy (11/2019), Portal hypertension ([place]), Raynaud's syndrome, and Thrombocytopenia ([place]).   Surgical History:   Past Surgical History:  Procedure Laterality Date   BREAST BIOPSY Right 09/2019   BREAST LUMPECTOMY Right 09/2019   BREAST LUMPECTOMY WITH AXILLARY LYMPH NODE BIOPSY Right 10/16/2019   Procedure: RIGHT BREAST LUMPECTOMY WITH SENTINEL LYMPH NODE BIOPSY;  Surgeon: Stark Klein, MD;  Location: [facility];  Service:  General;  Laterality: Right;   COLONOSCOPY     HAMMER TOE SURGERY Left 2012   pins placed in great toe, 2nd,3rd, 4th toes, all pins removed except great toe   IR PARACENTESIS  11/13/2019   IR RADIOLOGIST EVAL & MGMT  08/16/2019   IR RADIOLOGIST EVAL & MGMT  02/06/2020   IR RADIOLOGIST EVAL & MGMT  06/26/2020   IR RADIOLOGIST EVAL & MGMT  12/31/2020   IR RADIOLOGIST EVAL & MGMT  07/21/2021   IR RADIOLOGIST EVAL & MGMT  10/08/2021   IR THORACENTESIS ASP PLEURAL SPACE W/IMG GUIDE  10/12/2021   IR THORACENTESIS ASP PLEURAL SPACE W/IMG GUIDE  10/26/2021   PELVIC LAPAROSCOPY Bilateral 1992   RADIOLOGY WITH ANESTHESIA N/A 09/16/2021   Procedure: CT MICROWAVE ABLATION;  Surgeon: Aletta Edouard, MD;  Location: [facility];  Service: Radiology;  Laterality: N/A;   UPPER GASTROINTESTINAL ENDOSCOPY  2002   had esophageal dilitation     Social History:   reports that she has been smoking cigarettes. She started smoking about 42 years ago. She has a 15.00 pack-year smoking history. She has never used smokeless tobacco. She reports that she does not currently use alcohol after a past usage of about 6.0 standard drinks per week. She reports that she does not use drugs.   Family History:  Her family history includes Liver cancer in her maternal grandmother and mother; Lung cancer in her father and mother. There is no history of Colon cancer, Esophageal cancer, Stomach cancer, or Rectal cancer.   Allergies Allergies  Allergen Reactions   Hydromorphone Hcl Nausea And Vomiting    Severe vomiting   Aspirin Nausea Only     Home Medications  Prior to Admission medications   Medication Sig Start Date End Date Taking? Authorizing Provider  dicyclomine (BENTYL) 20 MG tablet Take one tablet every 4-6 hours as needed for abdominal cramping Patient taking differently: Take 20 mg by mouth every 4 (four) hours as needed (abdominal cramping). 11/21/19  Yes Irene Shipper, MD  ibuprofen (ADVIL) 200 MG tablet Take 400 mg  by mouth every 6 (six) hours as needed for mild pain.   Yes [provider]  loperamide (IMODIUM) 2 MG capsule Take 4 mg by mouth every other day.   Yes [provider]  nicotine (NICODERM CQ - DOSED IN MG/24 HOURS) 21 mg/24hr patch Place 21 mg onto the skin daily as needed (smoking cessation on work days).   Yes [provider]  OVER THE COUNTER MEDICATION Take 2 tablets by mouth every morning. Over the counter acid reducer   Yes [provider]  promethazine (PHENERGAN) 25 MG tablet Take 1 tablet (25 mg total) by mouth every 8 (eight) hours as needed for nausea or vomiting. 05/25/21  Yes Edrick Kins, DPM  spironolactone (ALDACTONE) 50 MG tablet Take 50 mg by mouth every morning. 10/09/21  Yes [provider]  tamoxifen (NOLVADEX) 20 MG tablet TAKE 1 TABLET BY MOUTH EVERY DAY Patient taking differently: Take 20 mg  by mouth every morning. 10/13/20  Yes Truitt Merle, MD  pantoprazole (PROTONIX) 40 MG tablet Take 1 tablet (40 mg total) by mouth daily. Office visit for future refills Patient not taking: Reported on 10/26/2021 06/16/21   Irene Shipper, MD     Critical care time: 45 min

## 2021-10-27 NOTE — Anesthesia Preprocedure Evaluation (Addendum)
Anesthesia Evaluation  Patient identified by MRN, date of birth, ID band Patient awake    Airway Mallampati: II  TM Distance: >3 FB     Dental   Pulmonary COPD, Current Smoker,    breath sounds clear to auscultation       Cardiovascular  Rhythm:Regular Rate:Normal  History noted Dr. Nyoka Cowden   Neuro/Psych  Headaches,    GI/Hepatic GERD  ,(+) Hepatitis -  Endo/Other    Renal/GU negative Renal ROS     Musculoskeletal   Abdominal   Peds  Hematology   Anesthesia Other Findings   Reproductive/Obstetrics                            Anesthesia Physical Anesthesia Plan  ASA: 3  Anesthesia Plan: General   Post-op Pain Management:    Induction: Intravenous  PONV Risk Score and Plan: 1 and Ondansetron, Propofol infusion and Midazolam  Airway Management Planned: Oral ETT  Additional Equipment:   Intra-op Plan:   Post-operative Plan: Extubation in OR  Informed Consent: I have reviewed the patients History and Physical, chart, labs and discussed the procedure including the risks, benefits and alternatives for the proposed anesthesia with the patient or authorized representative who has indicated his/her understanding and acceptance.     Dental advisory given  Plan Discussed with: CRNA and Anesthesiologist  Anesthesia Plan Comments:        Anesthesia Quick Evaluation

## 2021-10-27 NOTE — Anesthesia Procedure Notes (Signed)
Procedure Name: Intubation Date/Time: 10/27/2021 11:20 AM Performed by: Eben Burow, CRNA Pre-anesthesia Checklist: Patient identified, Emergency Drugs available, Suction available, Patient being monitored and Timeout performed Patient Re-evaluated:Patient Re-evaluated prior to induction Oxygen Delivery Method: Circle system utilized Preoxygenation: Pre-oxygenation with 100% oxygen Induction Type: IV induction, Rapid sequence and Cricoid Pressure applied Laryngoscope Size: Mac and 4 Grade View: Grade I Tube type: Oral Tube size: 7.0 mm Number of attempts: 1 Airway Equipment and Method: Stylet Placement Confirmation: ETT inserted through vocal cords under direct vision, positive ETCO2 and breath sounds checked- equal and bilateral Secured at: 21 cm Tube secured with: Tape Dental Injury: Teeth and Oropharynx as per pre-operative assessment

## 2021-10-27 NOTE — Assessment & Plan Note (Addendum)
S/p 2 units pRBC and 2 units FFP pm 11/28 and 11/29 respectively Hb stable at this time

## 2021-10-27 NOTE — Hospital Course (Signed)
55-year-old female with this is a 55 year old female with this of the liver secondary to hepatitis C and alcohol use, breast cancer who presents to the hospital for bloody vomitus.  She had at least 2 episodes of vomiting large amount of blood and admitted to black tarry stools the day before.  She felt lightheaded and also had some abdominal cramping. Of note the patient had a thoracentesis for 700 cc of hazy yellow fluid was removed from the left pleural space (done the same morning).  Was found to have a trace apical pneumothorax after the procedure.

## 2021-10-27 NOTE — Assessment & Plan Note (Addendum)
Per GI notes, this is progressing She is followed by [facility] and underwent an ablation of the dominant hepatic lesion in the left lobe about 1 month ago She has Theresa Rocha second 2.5 cm lesion in the right lobe of the liver with Theresa Rocha plan for ablation within the next month CT notably with neoplastic lesions within liver MRI with evidence of hepatocellular carcinoma, increasing in size Follows with [name] - consider discussion inpatient vs follow outpatient

## 2021-10-27 NOTE — Transfer of Care (Signed)
Immediate Anesthesia Transfer of Care Note  Patient: Theresa Rocha  Procedure(s) Performed: ESOPHAGOGASTRODUODENOSCOPY (EGD) WITH PROPOFOL ESOPHAGEAL BANDING  Patient Location: PACU  Anesthesia Type:General  Level of Consciousness: awake, alert  and patient cooperative  Airway & Oxygen Therapy: Patient Spontanous Breathing and Patient connected to face mask oxygen  Post-op Assessment: Report given to RN and Post -op Vital signs reviewed and stable  Post vital signs: Reviewed and stable  Last Vitals:  Vitals Value Taken Time  BP 108/73 10/27/21 1153  Temp    Pulse 94 10/27/21 1154  Resp 19 10/27/21 1154  SpO2 93 % 10/27/21 1154  Vitals shown include unvalidated device data.  Last Pain:  Vitals:   10/27/21 1035  TempSrc:   PainSc: 0-No pain         Complications: No notable events documented.

## 2021-10-28 ENCOUNTER — Inpatient Hospital Stay (HOSPITAL_COMMUNITY): Payer: BC Managed Care – PPO

## 2021-10-28 DIAGNOSIS — I8511 Secondary esophageal varices with bleeding: Secondary | ICD-10-CM

## 2021-10-28 DIAGNOSIS — E872 Acidosis, unspecified: Secondary | ICD-10-CM

## 2021-10-28 DIAGNOSIS — K639 Disease of intestine, unspecified: Secondary | ICD-10-CM

## 2021-10-28 DIAGNOSIS — C229 Malignant neoplasm of liver, not specified as primary or secondary: Secondary | ICD-10-CM

## 2021-10-28 DIAGNOSIS — J90 Pleural effusion, not elsewhere classified: Secondary | ICD-10-CM | POA: Diagnosis not present

## 2021-10-28 DIAGNOSIS — D62 Acute posthemorrhagic anemia: Secondary | ICD-10-CM | POA: Diagnosis not present

## 2021-10-28 DIAGNOSIS — F17210 Nicotine dependence, cigarettes, uncomplicated: Secondary | ICD-10-CM

## 2021-10-28 DIAGNOSIS — D696 Thrombocytopenia, unspecified: Secondary | ICD-10-CM

## 2021-10-28 DIAGNOSIS — K7030 Alcoholic cirrhosis of liver without ascites: Secondary | ICD-10-CM | POA: Diagnosis not present

## 2021-10-28 DIAGNOSIS — B1920 Unspecified viral hepatitis C without hepatic coma: Secondary | ICD-10-CM

## 2021-10-28 DIAGNOSIS — C50111 Malignant neoplasm of central portion of right female breast: Secondary | ICD-10-CM

## 2021-10-28 DIAGNOSIS — F1021 Alcohol dependence, in remission: Secondary | ICD-10-CM

## 2021-10-28 LAB — TYPE AND SCREEN
ABO/RH(D): A POS
Antibody Screen: NEGATIVE
Unit division: 0
Unit division: 0

## 2021-10-28 LAB — BPAM RBC
Blood Product Expiration Date: 202212142359
Blood Product Expiration Date: 202212142359
ISSUE DATE / TIME: 202211282235
ISSUE DATE / TIME: 202211290233
Unit Type and Rh: 6200
Unit Type and Rh: 6200

## 2021-10-28 LAB — BPAM FFP
Blood Product Expiration Date: 202212042359
Blood Product Expiration Date: 202212042359
ISSUE DATE / TIME: 202211290407
ISSUE DATE / TIME: 202211290530
Unit Type and Rh: 6200
Unit Type and Rh: 6200

## 2021-10-28 LAB — CBC
HCT: 34.5 % — ABNORMAL LOW (ref 36.0–46.0)
Hemoglobin: 10.8 g/dL — ABNORMAL LOW (ref 12.0–15.0)
MCH: 25.7 pg — ABNORMAL LOW (ref 26.0–34.0)
MCHC: 31.3 g/dL (ref 30.0–36.0)
MCV: 82.1 fL (ref 80.0–100.0)
Platelets: 71 10*3/uL — ABNORMAL LOW (ref 150–400)
RBC: 4.2 MIL/uL (ref 3.87–5.11)
RDW: 20.1 % — ABNORMAL HIGH (ref 11.5–15.5)
WBC: 10.3 10*3/uL (ref 4.0–10.5)
nRBC: 0 % (ref 0.0–0.2)

## 2021-10-28 LAB — BODY FLUID CELL COUNT WITH DIFFERENTIAL
Eos, Fluid: 2 %
Lymphs, Fluid: 55 %
Monocyte-Macrophage-Serous Fluid: 37 % — ABNORMAL LOW (ref 50–90)
Neutrophil Count, Fluid: 6 % (ref 0–25)
Total Nucleated Cell Count, Fluid: 107 cu mm (ref 0–1000)

## 2021-10-28 LAB — GLUCOSE, CAPILLARY
Glucose-Capillary: 118 mg/dL — ABNORMAL HIGH (ref 70–99)
Glucose-Capillary: 103 mg/dL — ABNORMAL HIGH (ref 70–99)
Glucose-Capillary: 115 mg/dL — ABNORMAL HIGH (ref 70–99)

## 2021-10-28 LAB — COMPREHENSIVE METABOLIC PANEL
ALT: 17 U/L (ref 0–44)
AST: 37 U/L (ref 15–41)
Albumin: 2.5 g/dL — ABNORMAL LOW (ref 3.5–5.0)
Alkaline Phosphatase: 51 U/L (ref 38–126)
Anion gap: 5 (ref 5–15)
BUN: 12 mg/dL (ref 6–20)
CO2: 20 mmol/L — ABNORMAL LOW (ref 22–32)
Calcium: 6.9 mg/dL — ABNORMAL LOW (ref 8.9–10.3)
Chloride: 115 mmol/L — ABNORMAL HIGH (ref 98–111)
Creatinine, Ser: 0.55 mg/dL (ref 0.44–1.00)
GFR, Estimated: >60 mL/min (ref >60)
Glucose, Bld: 106 mg/dL — ABNORMAL HIGH (ref 70–99)
Potassium: 3.5 mmol/L (ref 3.5–5.1)
Sodium: 140 mmol/L (ref 135–145)
Total Bilirubin: 0.9 mg/dL (ref 0.3–1.2)
Total Protein: 5.4 g/dL — ABNORMAL LOW (ref 6.5–8.1)

## 2021-10-28 LAB — PREPARE FRESH FROZEN PLASMA

## 2021-10-28 LAB — HEMOGLOBIN AND HEMATOCRIT, BLOOD
HCT: 30.4 % — ABNORMAL LOW (ref 36.0–46.0)
HCT: 31.8 % — ABNORMAL LOW (ref 36.0–46.0)
Hemoglobin: 9 g/dL — ABNORMAL LOW (ref 12.0–15.0)
Hemoglobin: 9.7 g/dL — ABNORMAL LOW (ref 12.0–15.0)

## 2021-10-28 LAB — PROTEIN, PLEURAL OR PERITONEAL FLUID: Total protein, fluid: <3 g/dL

## 2021-10-28 LAB — ALBUMIN, PLEURAL OR PERITONEAL FLUID: Albumin, Fluid: <1.5 g/dL

## 2021-10-28 LAB — LACTATE DEHYDROGENASE, PLEURAL OR PERITONEAL FLUID: LD, Fluid: 34 U/L — ABNORMAL HIGH (ref 3–23)

## 2021-10-28 LAB — MRSA NEXT GEN BY PCR, NASAL: MRSA by PCR Next Gen: NOT DETECTED

## 2021-10-28 LAB — PROTIME-INR
INR: 1.6 — ABNORMAL HIGH (ref 0.8–1.2)
Prothrombin Time: 18.8 seconds — ABNORMAL HIGH (ref 11.4–15.2)

## 2021-10-28 LAB — MAGNESIUM: Magnesium: 1.8 mg/dL (ref 1.7–2.4)

## 2021-10-28 MED ORDER — MAGNESIUM SULFATE 2 GM/50ML IV SOLN
2.0000 g | Freq: Once | INTRAVENOUS | Status: AC
  Administered 2021-10-28: 2 g via INTRAVENOUS
  Filled 2021-10-28: qty 50

## 2021-10-28 MED ORDER — FUROSEMIDE 10 MG/ML IJ SOLN
40.0000 mg | Freq: Four times a day (QID) | INTRAMUSCULAR | Status: AC
  Administered 2021-10-28 (×2): 40 mg via INTRAVENOUS
  Filled 2021-10-28 (×2): qty 4

## 2021-10-28 MED ORDER — POTASSIUM CHLORIDE CRYS ER 20 MEQ PO TBCR
40.0000 meq | EXTENDED_RELEASE_TABLET | Freq: Two times a day (BID) | ORAL | Status: AC
  Administered 2021-10-28 (×2): 40 meq via ORAL
  Filled 2021-10-28 (×2): qty 2

## 2021-10-28 NOTE — Assessment & Plan Note (Signed)
Noted on CT 11/28, mild thickening of walls of colon and distal small bowel  ? Related to cirrhosis vs enterocolitis

## 2021-10-28 NOTE — Progress Notes (Addendum)
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  She notably had thoracentesis by IR prior to admission with 700 cc taken off L side.  She's now s/p EGD and had second thoracentesis by PCCM on 11/30.   Assessment & Plan:   Principal Problem:   Secondary esophageal varices with bleeding (HCC) Active Problems:   Acute blood loss anemia   Cirrhosis (HCC)   Pleural effusion, left   Thrombocytopenia (HCC)   Malignant neoplasm of central portion of right breast in female, estrogen receptor positive (HCC)   Hepatocellular carcinoma (HCC)   Metabolic acidosis   Bowel wall thickening  * Secondary esophageal varices with bleeding ([place]) EGD 11/29 with recent upper GI bleed 2/2 esophageal varices s/p band ligation, incidental distal esophageal ring, mild pyloric inflammation Continue octreotide per GI, PPI 40 mg PO daily, will need follow up EGD with band ligation in 4 weeks   Acute blood loss anemia S/p 2 units pRBC and 2 units FFP pm 11/28 and 11/29 respectively Hb stable at this time  Cirrhosis ([place])  thought to be due to ETOH and Hep C Followed by [facility]   Pleural effusion, left Thoracentesis 11/28 by IR Post procedure CXR with trace apical PTX  Reportedly she states that her hepatologist suspects that the recurrent effusions have something to do the the ablation of the hepatocellular cancer CXR 11/30 with moderate L pleural effusion, mild pulm edema Recurrent thoracentesis done 11/30 by pulm, appreciate assistance Repeat CXR 11/30 with small L effusion, decreased from prior  Labs reviewed from 11/2, suspected transudate Appreciate pulm assistance, follow  repeat pleural fluid studies with cytology - may need to consider TIPS if rapidly reaccumulating  Lasix per pulm, will follow and reassess in AM Echo pending  Thrombocytopenia ([place]) -chronic  Malignant neoplasm of central portion of right breast in female, estrogen receptor positive ([place]) - Diagnosed in 2020 - Status postlumpectomy and radiation  Metabolic acidosis Mild, follow   Hepatocellular carcinoma ([place]) - Per GI notes, this is progressing - She is followed by [facility] and underwent an ablation of the dominant hepatic lesion in the left lobe about 1 month ago - She has Celinda Dethlefs second 2.5 cm lesion in the right lobe of the liver with Cristan Scherzer plan for ablation within the next month -CT notably with neoplastic lesions within liver  Bowel wall thickening Noted on CT 11/28, mild thickening of walls of colon and distal small bowel  ? Related to cirrhosis vs enterocolitis  DVT prophylaxis: SCD Code Status: full Family Communication: none at bedside  Status is: Inpatient  Remains inpatient appropriate because: continued need for inpatient monitoring, treatment       Consultants:  GI PCCM  Procedures:  EGD impression 1. Recent upper GI bleed secondary to esophageal varices status post band ligation 2. Incidental distal esophageal ring 3. Mild pyloric inflammation 4. Otherwise normal EGD. Recommendation 1. Patient will return to the hospital ward for ongoing care and observation 2. Continue IV octreotide for now 3. Convert pantoprazole to 40 mg p.o. daily 4. Continue to monitor stools and hemoglobin 5. Clear liquid diet today 6. Will need follow-up EGD with band ligation  in about 4 weeks  Thoracentesis 11/28 by IR Thoracentesis 11/30 by PCCM  Antimicrobials:  Anti-infectives (From admission, onward)    Start     Dose/Rate Route Frequency Ordered Stop   10/27/21 0003  sodium chloride 0.9 % with cefTRIAXone (ROCEPHIN) ADS Med       Note to Pharmacy: Andre Lefort: cabinet override      10/27/21 0003 10/27/21 1214   10/26/21 2245  cefTRIAXone (ROCEPHIN) 2 g in sodium chloride 0.9 % 100 mL IVPB        2 g 200 mL/hr over 30 Minutes Intravenous Every 24 hours 10/26/21 2239            Subjective: No new complaints  Objective: Vitals:   10/28/21 1400 10/28/21 1500 10/28/21 1600 10/28/21 1611  BP: 126/82 138/72    Pulse: 91 94 94   Resp: (!) 23 18 (!) 24   Temp:    98.2 F (36.8 C)  TempSrc:    Oral  SpO2: (!) 89% 94% 94%   Weight:      Height:        Intake/Output Summary (Last 24 hours) at 10/28/2021 1613 Last data filed at 10/28/2021 1559 Gross per 24 hour  Intake 1419.6 ml  Output --  Net 1419.6 ml   Filed Weights   10/26/21 1643 10/27/21 1035  Weight: 49.9 kg 49.9 kg    Examination:  General exam: Appears calm and comfortable  Respiratory system: unlabored, on 2 L Cardiovascular system: RRR Gastrointestinal system: Abdomen is nondistended, soft and nontender. Central nervous system: Alert and oriented. No focal neurological deficits. Extremities: trace edema  Data Reviewed: I have personally reviewed following labs and imaging studies  CBC: Recent Labs  Lab 10/26/21 1757 10/27/21 0429 10/27/21 1523 10/27/21 2144 10/28/21 0244 10/28/21 0846  WBC 9.6 8.5  --   --   --   --   HGB 8.3* 9.1* 9.5* 9.0* 9.0* 9.7*  HCT 28.9* 29.3* 30.3* 28.9* 30.4* 31.8*  MCV 79.0* 81.6  --   --   --   --   PLT 124* 60*  --   --   --   --     Basic Metabolic Panel: Recent Labs  Lab 10/26/21 1757 10/27/21 0429 10/28/21 0846  NA 137 139 140  K 4.3 4.3 3.5  CL 108 116* 115*  CO2 22 20* 20*  GLUCOSE 98 119* 106*  BUN 15 14 12   CREATININE 0.53 0.54 0.55  CALCIUM 7.8* 6.3* 6.9*  MG  --   --  1.8    GFR: Estimated Creatinine Clearance: 63.3 mL/min (by C-G formula based on SCr of 0.55 mg/dL).  Liver Function Tests: Recent Labs  Lab 10/26/21 1757 10/27/21 0429 10/28/21 0846  AST 44* 31 37  ALT 23 17 17    ALKPHOS 56 45 51  BILITOT 1.6* 1.7* 0.9  PROT 6.6 4.7* 5.4*  ALBUMIN 2.8* 2.0* 2.5*    CBG: Recent Labs  Lab 10/27/21 0025 10/27/21 0842 10/27/21 1542 10/28/21 0003 10/28/21 0731  GLUCAP 104* 91 77 118* 103*     Recent Results (from the past 240 hour(s))  Resp Panel by RT-PCR (Flu Oaklee Sunga&B, Covid) Nasopharyngeal Swab     Status: None   Collection Time: 10/26/21  5:57 PM   Specimen: Nasopharyngeal Swab; Nasopharyngeal(NP) swabs in vial transport medium  Result Value Ref Range Status   SARS Coronavirus 2 by RT PCR NEGATIVE NEGATIVE Final    Comment: (NOTE) SARS-CoV-2 target nucleic acids are NOT  DETECTED.  The SARS-CoV-2 RNA is generally detectable in upper respiratory specimens during the acute phase of infection. The lowest concentration of SARS-CoV-2 viral copies this assay can detect is 138 copies/mL. Zylon Creamer negative result does not preclude SARS-Cov-2 infection and should not be used as the sole basis for treatment or other patient management decisions. Faige Seely negative result may occur with  improper specimen collection/handling, submission of specimen other than nasopharyngeal swab, presence of viral mutation(s) within the areas targeted by this assay, and inadequate number of viral copies(<138 copies/mL). Daryana Whirley negative result must be combined with clinical observations, patient history, and epidemiological information. The expected result is Negative.  Fact Sheet for Patients:  EntrepreneurPulse.com.au  Fact Sheet for Healthcare Providers:  IncredibleEmployment.be  This test is no t yet approved or cleared by the [place] FDA and  has been authorized for detection and/or diagnosis of SARS-CoV-2 by FDA under an Emergency Use Authorization (EUA). This EUA will remain  in effect (meaning this test can be used) for the duration of the COVID-19 declaration under Section 564(b)(1) of the Act, 21 U.S.C.section 360bbb-3(b)(1), unless the  authorization is terminated  or revoked sooner.       Influenza Coen Miyasato by PCR NEGATIVE NEGATIVE Final   Influenza B by PCR NEGATIVE NEGATIVE Final    Comment: (NOTE) The Xpert Xpress SARS-CoV-2/FLU/RSV plus assay is intended as an aid in the diagnosis of influenza from Nasopharyngeal swab specimens and should not be used as Rylinn Linzy sole basis for treatment. Nasal washings and aspirates are unacceptable for Xpert Xpress SARS-CoV-2/FLU/RSV testing.  Fact Sheet for Patients: EntrepreneurPulse.com.au  Fact Sheet for Healthcare Providers: IncredibleEmployment.be  This test is not yet approved or cleared by the [place] FDA and has been authorized for detection and/or diagnosis of SARS-CoV-2 by FDA under an Emergency Use Authorization (EUA). This EUA will remain in effect (meaning this test can be used) for the duration of the COVID-19 declaration under Section 564(b)(1) of the Act, 21 U.S.C. section 360bbb-3(b)(1), unless the authorization is terminated or revoked.  Performed at [facility], [place] [address], [place], [place] [address]   MRSA Next Gen by PCR, Nasal     Status: None   Collection Time: 10/27/21  3:08 PM   Specimen: Nasal Mucosa; Nasal Swab  Result Value Ref Range Status   MRSA by PCR Next Gen NOT DETECTED NOT DETECTED Final    Comment: (NOTE) The GeneXpert MRSA Assay (FDA approved for NASAL specimens only), is one component of Toshiye Kever comprehensive MRSA colonization surveillance program. It is not intended to diagnose MRSA infection nor to guide or monitor treatment for MRSA infections. Test performance is not FDA approved in patients less than 52 years old. Performed at [facility], [place] [address] [place], [place] [address]          Radiology Studies: DG Chest 1 View  Result Date: 10/28/2021 CLINICAL DATA:  Post left thoracentesis. EXAM: CHEST  1 VIEW COMPARISON:  Chest x-ray 10/28/2021.  FINDINGS: There is Travonta Gill small left pleural effusion which has decreased when compared to the prior study. There is no pneumothorax. Cardiomediastinal silhouette is within normal limits. There is some minimal opacities in the right mid lung and right lower lung which have mildly increased. Surgical clips overlie the right chest. No acute fractures are seen. IMPRESSION: 1. Small left pleural effusion, decreased from prior. 2. No pneumothorax. 3. Mild increase in right mid and lower lung airspace disease. Electronically Signed   By: Ronney Asters  M.D.   On: 10/28/2021 15:44   CT Abdomen Pelvis W Contrast  Result Date: 10/26/2021 CLINICAL DATA:  Acute abdominal pain. History of esophageal varices. Hemoptysis of bright red blood yesterday. Black tarry stools since yesterday. EXAM: CT ABDOMEN AND PELVIS WITH CONTRAST TECHNIQUE: Multidetector CT imaging of the abdomen and pelvis was performed using the standard protocol following bolus administration of intravenous contrast. CONTRAST:  43mL OMNIPAQUE IOHEXOL 350 MG/ML SOLN COMPARISON:  CT abdomen dated 07/04/2019 FINDINGS: Lower chest: LEFT pleural effusion, at least moderate in size, with adjacent atelectasis. RIGHT lung base is clear. Esophageal varices again identified within the lower esophagus, incompletely imaged. Hepatobiliary: Hypodense mass within the LEFT liver lobe, segment 2, measuring 4 cm, new compared to the CT abdomen of 07/04/2019 but most recently described on multiple abdominal MRIs, most recently described on MRI abdomen of 07/17/2021 as Budd Freiermuth known hepatocellular carcinoma. Additional hypodense lesion within the posterior RIGHT liver lobe, measuring 2.5 cm, with ring enhancement, compatible with another known neoplastic lesion in segment 6, likely enlarged compared to the most recent MRI abdomen of 07/17/2021. Additional smaller lesions scattered within the liver, also as previously described by MRI. Liver contours again appear nodular throughout,  compatible with underlying cirrhosis. Pancreas: Unremarkable. No pancreatic ductal dilatation or surrounding inflammatory changes. Spleen: Normal in size without focal abnormality. Adrenals/Urinary Tract: Adrenal glands are unremarkable. Kidneys appear normal without mass, stone or hydronephrosis. Stomach/Bowel: No dilated large or small bowel loops. At least mild thickening of the walls of the entire colon. Additional probable thickening of the walls of the distal small bowel. More proximal small bowel is unremarkable. Stomach is unremarkable, partially decompressed. Vascular/Lymphatic: No acute-appearing vascular abnormality. Lower esophageal varices, as described above. Reproductive: Uterus and bilateral adnexa are unremarkable. Other: Trace ascites within the pelvis. No abscess collection. No free intraperitoneal air. Musculoskeletal: No acute-appearing osseous abnormality. IMPRESSION: 1. LEFT pleural effusion, at least moderate in size, with adjacent atelectasis. The pleural effusion is decreased compared to recent chest CT angiogram of 09/30/2021, status post interval thoracentesis. 2. At least mild thickening of the walls of the entire colon and distal small bowel, suggestive of Brenyn Petrey diffuse enterocolitis of infectious or inflammatory nature. No bowel obstruction. 3. Prominent esophageal varices at the level of the lower esophagus, incompletely imaged. Can not exclude active hemorrhage within the lower esophagus, but no layering hemorrhage within the stomach to confirm active hemorrhage. 4. Cirrhotic liver. 5. Known neoplastic lesions within the liver, most recently described on MRI abdomen of 07/17/2021. 6. Trace ascites within the pelvis. No abscess collection. No free intraperitoneal air. Electronically Signed   By: Franki Cabot M.D.   On: 10/26/2021 20:33   DG CHEST PORT 1 VIEW  Result Date: 10/28/2021 CLINICAL DATA:  Cough and shortness of breath EXAM: PORTABLE CHEST 1 VIEW COMPARISON:  10/26/2021  FINDINGS: Significantly increased left pleural effusion and associated atelectasis. Suspect mild pulmonary edema. Increased heart size. IMPRESSION: Increased, now moderate left pleural effusion with associated atelectasis. Suspect mild pulmonary edema, noting increased heart size. Electronically Signed   By: Macy Mis M.D.   On: 10/28/2021 09:06   DG Chest Port 1 View  Result Date: 10/26/2021 CLINICAL DATA:  Vomiting, possible hematemesis. Possible blood in stool. Left thoracentesis today. EXAM: PORTABLE CHEST 1 VIEW COMPARISON:  10/26/2021 1:06 p.m. and 3:13 p.m. FINDINGS: Emphysema. Small left pleural effusion with Samie Barclift hazy density along the left hemidiaphragm, probably from layering effusion given the semi erect positioning although Derrico Zhong component of re-expansion edema in this segment  of the lung is not excluded. No visible pneumothorax. Heart size within normal limits. Right axillary and breast clips noted. IMPRESSION: 1. Small remaining left pleural effusion likely layering due to the semi erect positioning. There is some hazy density left lung base probably from layering effusion, less likely from re-expansion edema of this segment of lung. 2. No pneumothorax. 3.  Emphysema (ICD10-J43.9). Electronically Signed   By: Van Clines M.D.   On: 10/26/2021 19:00        Scheduled Meds:  Chlorhexidine Gluconate Cloth  6 each Topical Daily   furosemide  40 mg Intravenous Q6H   mouth rinse  15 mL Mouth Rinse BID   nicotine  21 mg Transdermal Daily   pantoprazole  40 mg Oral Daily   potassium chloride  40 mEq Oral BID   tamoxifen  20 mg Oral Daily   Continuous Infusions:  cefTRIAXone (ROCEPHIN)  IV Stopped (10/28/21 0009)   magnesium sulfate bolus IVPB 50 mL/hr at 10/28/21 1559   octreotide  (SANDOSTATIN)    IV infusion 50 mcg/hr (10/28/21 1559)     LOS: 2 days    Time spent: over 30 min    Fayrene Helper, MD [facility]   To contact the attending provider between 7A-7P  or the covering provider during after hours 7P-7A, please log into the web site [email or web address] and access using universal [place] password for that web site. If you do not have the password, please call the hospital operator.  10/28/2021, 4:13 PM

## 2021-10-28 NOTE — Progress Notes (Signed)
   NAME:  Theresa Rocha, MRN:  [id number], DOB:  1966/10/15, LOS: 2 ADMISSION DATE:  10/26/2021, CONSULTATION DATE:  10/26/21 REFERRING MD:  Rogene Houston CHIEF COMPLAINT:  hematemesis  History of Present Illness:  55 y/o F whop presented to [facility] ER on 11/28 with reports of hematemesis.  She has a known history of hepatitis C+, esophageal varices and hepatocellular carcinoma.  Prior to presentation, she had two episodes of hematemesis, frequent bouts of black tarry stools. In the ER she had two episodes of hematemesis.  Her baseline BP normally runs 793 systolic.  She had a left pleural effusion on presentation which was evacuated with thoracentesis (700 ml pleural fluid, no cytology/labs sent). She had a prior thoracentesis on 09/30/21 which was a transudate and positive for atypical cells but favored to be reactive mesothelial cells.  PCCM initially saw for critical care evaluation- BP improved with PRBC resuscitation.  She was stable and admitted per [facility].  On 11/30 CXR showed re accumulation of left pleural fluid.  PCCM called back 11/30 for evaluation of effusion.    Pertinent  Medical History  Reported hx of esophageal varices.  Pleural effusion Iron deficiency anemia Throbmocytopenia Right Breast cancer (on tamoxifen) Hepatocellular carcinoma Cirrhosis Chronic hep C  GERD ETOH hepatitis (?)  Significant Hospital Events: Including procedures, antibiotic start and stop dates in addition to other pertinent events   11/28 Admit  11/30 PCCM re-eval for left effusion  Interim History / Subjective:  Afebrile  On 2L [place]   Objective   Blood pressure 125/78, pulse 88, temperature 98.4 F (36.9 C), temperature source Oral, resp. rate 19, height 5\' 3"  (1.6 m), weight 49.9 kg, last menstrual period 04/20/2013, SpO2 92 %.        Intake/Output Summary (Last 24 hours) at 10/28/2021 1153 Last data filed at 10/28/2021 0758 Gross per 24 hour  Intake 1358.12 ml  Output --  Net 1358.12 ml   Filed  Weights   10/26/21 1643 10/27/21 1035  Weight: 49.9 kg 49.9 kg    General: well appearing, no distress at rest Cardiac: RRR, no LE edema Pulm: breathing comfortably on 4L at rest, desats quickly with minimal exertion, decreased breath sounds on the left, lungs sounds clear on the right GI: soft, nontender Skin: no rash or lesion on limited exam  Resolved Hospital Problem list     Assessment & Plan:   Recurrent Transudative Left Pleural Effusion in setting of Cirrhosis, Hepatocellular Carcinoma, Hx Breast Cancer ([place]) Admitted with hematemesis, hypotension.  Improved with volume resuscitation.  S/p thoracentesis on 11/28 with 700 ml of pleural fluid returned.  No cytology sent on current pleural fluid.  Rapid return of fluid on left, ? Ascitic fluid vs not proven malignant fluid vs hemorrhagic.   S/p left thoracentesis; 1600cc clear yellow fluid removed; non-bloody  -follow intermittent CXR  -repeat pleural fluid studies with cytology sent -if fluid rapidly re-accumulates again, would consider discussing whether or not she would benefit from [place], MD Internal Medicine Resident PGY-3 [facility] 10/28/2021 3:29 PM

## 2021-10-28 NOTE — Assessment & Plan Note (Addendum)
resolved 

## 2021-10-28 NOTE — Procedures (Signed)
Thoracentesis  Procedure Note  Theresa Rocha  [id number]  [birth year]  Date:10/28/21  Time:3:35 PM   Provider Performing:Sieara Bremer   Procedure: Thoracentesis with imaging guidance [id number])  Indication(s) Pleural Effusion  Consent Risks of the procedure as well as the alternatives and risks of each were explained to the patient and/or caregiver.  Consent for the procedure was obtained and is signed in the bedside chart  Anesthesia Topical only with 1% lidocaine    Time Out Verified patient identification, verified procedure, site/side was marked, verified correct patient position, special equipment/implants available, medications/allergies/relevant history reviewed, required imaging and test results available.   Sterile Technique Maximal sterile technique including full sterile barrier drape, hand hygiene, sterile gown, sterile gloves, mask, hair covering, sterile ultrasound probe cover (if used).  Procedure Description Ultrasound was used to identify appropriate pleural anatomy for placement and overlying skin marked.  Area of drainage cleaned and draped in sterile fashion. Lidocaine was used to anesthetize the skin and subcutaneous tissue.  1600 cc's of clear yellow appearing fluid was drained from the left pleural space. Catheter then removed and bandaid applied to site.   Complications/Tolerance None; patient tolerated the procedure well. Chest X-ray is ordered to confirm no post-procedural complication.   EBL Minimal   Specimen(s) Pleural fluid    Alec Mcphee Darrick Meigs, MD Internal Medicine Resident PGY-3 [facility] 10/28/2021 3:36 PM

## 2021-10-28 NOTE — Progress Notes (Addendum)
Patient ID: Theresa Rocha, female   DOB: [birth year], 55 y.o.   MRN: [id number]    Progress Note   Subjective   Day # 2  CC; acute upper GI bleed, cirrhosis, hepatocellular carcinoma  EGD yesterday with banding x3, on the high risk varix.  3 columns of grade 2 varices, no evidence of proximal gastric varices or significant portal hypertensive gastropathy  AFP pending Pro time 18.8/INR 1.6 Hemoglobin 9/hematocrit 30.4-stable  IV octreotide Twice daily PPI Ceftriaxone   Chest x-ray -significantly increased left effusion  S/P thoracentesis with removal of 700 cc on 10/26/2021 Previous cytology done a few weeks ago showed atypical cells however overall favoring reactive mesothelial cells  She is complaining of substernal chest discomfort and soreness with swallowing post banding, also some sense of not being able to get a deep breath/shortness of breath and concerned about reaccumulation of fluid No bowel movements today, did pass some old dark blood last night    Objective   Vital signs in last 24 hours: Temp:  [97.9 F (36.6 C)-98.5 F (36.9 C)] 98.5 F (36.9 C) (11/30 0300) Pulse Rate:  [79-102] 83 (11/30 0800) Resp:  [13-33] 23 (11/30 0800) BP: (100-133)/(61-88) 125/79 (11/30 0800) SpO2:  [88 %-99 %] 89 % (11/30 0800) Weight:  [49.9 kg] 49.9 kg (11/29 1035) Last BM Date: 10/27/21 General:   Older white female in NAD Heart:  Regular rate and rhythm; no murmurs Lungs: Respirations even and unlabored, decreased breath sounds left lung up one half Abdomen:  Soft, nontender and nondistended. Normal bowel sounds. Extremities:  Without edema. Neurologic:  Alert and oriented,  grossly normal neurologically. Psych:  Cooperative. Normal mood and affect.  Intake/Output from previous day: 11/29 0701 - 11/30 0700 In: 2130.8 [P.O.:360; I.V.:1554.1; IV Piggyback:216.7] Out: -  Intake/Output this shift: Total I/O In: 27.4 [I.V.:27.4] Out: -   Lab Results: Recent Labs     10/26/21 1757 10/27/21 0429 10/27/21 1523 10/27/21 2144 10/28/21 0244  WBC 9.6 8.5  --   --   --   HGB 8.3* 9.1* 9.5* 9.0* 9.0*  HCT 28.9* 29.3* 30.3* 28.9* 30.4*  PLT 124* 60*  --   --   --    BMET Recent Labs    10/26/21 1757 10/27/21 0429  NA 137 139  K 4.3 4.3  CL 108 116*  CO2 22 20*  GLUCOSE 98 119*  BUN 15 14  CREATININE 0.53 0.54  CALCIUM 7.8* 6.3*   LFT Recent Labs    10/27/21 0429  PROT 4.7*  ALBUMIN 2.0*  AST 31  ALT 17  ALKPHOS 45  BILITOT 1.7*   PT/INR Recent Labs    10/27/21 0429 10/28/21 0244  LABPROT 21.3* 18.8*  INR 1.8* 1.6*    Studies/Results: DG Chest 1 View  Result Date: 10/26/2021 CLINICAL DATA:  Pleural effusion, status post left thoracentesis. EXAM: CHEST  1 VIEW COMPARISON:  10/12/2021 and CT chest 09/30/2021. FINDINGS: Trachea is midline. Heart size normal. Small residual left pleural effusion, decreased from 10/12/2021. Trace left apical pneumothorax. Minimal left basilar atelectasis. Right lung is clear. Surgical clips in right axilla. IMPRESSION: 1. Trace left apical pneumothorax status post left thoracentesis. Finding may not be clinically significant. These results were called by telephone at the time of interpretation on 10/26/2021 at 1:38 pm to provider [facility] , who verbally acknowledged these results. 2. Small residual left pleural effusion with left basilar subsegmental atelectasis. Electronically Signed   By: Lorin Picket M.D.   On: 10/26/2021  13:38   CT Abdomen Pelvis W Contrast  Result Date: 10/26/2021 CLINICAL DATA:  Acute abdominal pain. History of esophageal varices. Hemoptysis of bright red blood yesterday. Black tarry stools since yesterday. EXAM: CT ABDOMEN AND PELVIS WITH CONTRAST TECHNIQUE: Multidetector CT imaging of the abdomen and pelvis was performed using the standard protocol following bolus administration of intravenous contrast. CONTRAST:  57mL OMNIPAQUE IOHEXOL 350 MG/ML SOLN COMPARISON:  CT abdomen  dated 07/04/2019 FINDINGS: Lower chest: LEFT pleural effusion, at least moderate in size, with adjacent atelectasis. RIGHT lung base is clear. Esophageal varices again identified within the lower esophagus, incompletely imaged. Hepatobiliary: Hypodense mass within the LEFT liver lobe, segment 2, measuring 4 cm, new compared to the CT abdomen of 07/04/2019 but most recently described on multiple abdominal MRIs, most recently described on MRI abdomen of 07/17/2021 as a known hepatocellular carcinoma. Additional hypodense lesion within the posterior RIGHT liver lobe, measuring 2.5 cm, with ring enhancement, compatible with another known neoplastic lesion in segment 6, likely enlarged compared to the most recent MRI abdomen of 07/17/2021. Additional smaller lesions scattered within the liver, also as previously described by MRI. Liver contours again appear nodular throughout, compatible with underlying cirrhosis. Pancreas: Unremarkable. No pancreatic ductal dilatation or surrounding inflammatory changes. Spleen: Normal in size without focal abnormality. Adrenals/Urinary Tract: Adrenal glands are unremarkable. Kidneys appear normal without mass, stone or hydronephrosis. Stomach/Bowel: No dilated large or small bowel loops. At least mild thickening of the walls of the entire colon. Additional probable thickening of the walls of the distal small bowel. More proximal small bowel is unremarkable. Stomach is unremarkable, partially decompressed. Vascular/Lymphatic: No acute-appearing vascular abnormality. Lower esophageal varices, as described above. Reproductive: Uterus and bilateral adnexa are unremarkable. Other: Trace ascites within the pelvis. No abscess collection. No free intraperitoneal air. Musculoskeletal: No acute-appearing osseous abnormality. IMPRESSION: 1. LEFT pleural effusion, at least moderate in size, with adjacent atelectasis. The pleural effusion is decreased compared to recent chest CT angiogram of  09/30/2021, status post interval thoracentesis. 2. At least mild thickening of the walls of the entire colon and distal small bowel, suggestive of a diffuse enterocolitis of infectious or inflammatory nature. No bowel obstruction. 3. Prominent esophageal varices at the level of the lower esophagus, incompletely imaged. Can not exclude active hemorrhage within the lower esophagus, but no layering hemorrhage within the stomach to confirm active hemorrhage. 4. Cirrhotic liver. 5. Known neoplastic lesions within the liver, most recently described on MRI abdomen of 07/17/2021. 6. Trace ascites within the pelvis. No abscess collection. No free intraperitoneal air. Electronically Signed   By: Franki Cabot M.D.   On: 10/26/2021 20:33   DG Chest 1V REPEAT Same Day  Result Date: 10/26/2021 CLINICAL DATA:  Post thoracentesis.  Evaluate for pneumothorax EXAM: CHEST - 1 VIEW SAME DAY COMPARISON:  IR ultrasound, earlier same day. Chest XR, 10/26/2021 and 10/09/2021. FINDINGS: Cardiomediastinal silhouette is within normal limits. Lungs are well inflated. Decreased volume of LEFT pleural effusion, as compared to 10/09/2021 chest XR. No enlarging pneumothorax. RIGHT chest and axillary surgical clips. No acute osseous abnormality. IMPRESSION: No enlarging pneumothorax. Electronically Signed   By: Michaelle Birks M.D.   On: 10/26/2021 15:28   DG Chest Port 1 View  Result Date: 10/26/2021 CLINICAL DATA:  Vomiting, possible hematemesis. Possible blood in stool. Left thoracentesis today. EXAM: PORTABLE CHEST 1 VIEW COMPARISON:  10/26/2021 1:06 p.m. and 3:13 p.m. FINDINGS: Emphysema. Small left pleural effusion with a hazy density along the left hemidiaphragm, probably from layering effusion  given the semi erect positioning although a component of re-expansion edema in this segment of the lung is not excluded. No visible pneumothorax. Heart size within normal limits. Right axillary and breast clips noted. IMPRESSION: 1. Small  remaining left pleural effusion likely layering due to the semi erect positioning. There is some hazy density left lung base probably from layering effusion, less likely from re-expansion edema of this segment of lung. 2. No pneumothorax. 3.  Emphysema (ICD10-J43.9). Electronically Signed   By: Van Clines M.D.   On: 10/26/2021 19:00   IR THORACENTESIS ASP PLEURAL SPACE W/IMG GUIDE  Result Date: 10/26/2021 INDICATION: History of hepatocellular carcinoma, shortness of breast. Request for therapeutic thoracentesis. EXAM: ULTRASOUND GUIDED LEFT THORACENTESIS MEDICATIONS: 10 mL 1% lidocaine COMPLICATIONS: SIR Level A - No therapy, no consequence. PROCEDURE: An ultrasound guided thoracentesis was thoroughly discussed with the patient and questions answered. The benefits, risks, alternatives and complications were also discussed. The patient understands and wishes to proceed with the procedure. Written consent was obtained. Ultrasound was performed to localize and mark an adequate pocket of fluid in the left chest. The area was then prepped and draped in the normal sterile fashion. 1% Lidocaine was used for local anesthesia. Under ultrasound guidance a 6 Fr Safe-T-Centesis catheter was introduced. Thoracentesis was performed. The catheter was removed and a dressing applied. FINDINGS: A total of approximately 700 cc of hazy yellow fluid was removed. Postprocedure chest x-ray reviewed, trace left apical pneumothorax seen. IMPRESSION: Successful ultrasound guided left thoracentesis yielding 700 cc of pleural fluid. Procedure performed by: Durenda Guthrie, IR PA-C Electronically Signed   By: Michaelle Birks M.D.   On: 10/26/2021 15:25       Assessment / Plan:    #63 55 year old white female with decompensated cirrhosis and multifocal hepatocellular carcinoma who presented with an acute upper GI bleed yesterday with hematemesis  EGD yesterday with finding of 3 columns of grade 2 varices, 1 column with stigmata of  recent bleeding which was banded x3 No significant portal gastropathy  Patient has not had any active bleeding postprocedure, and hemoglobin stable  She is having some substernal discomfort in the area of the lower esophagus not unexpected post banding  #2 shortness of breath-x-ray this a.m. shows reaccumulation of left pleural effusion, she just had thoracentesis 2 days ago with removal of 700 cc Question hepatic hydrothorax, rule out malignant effusion May need repeat thoracentesis, and will ask critical care/pulmonary regarding need for Pleurx  #3 anemia acute on chronic secondary to acute GI blood loss stable #4 breast cancer diagnosed 2020 status postlumpectomy and radiation  #5 multifocal hepatocellular carcinoma, she had thermal ablation of the largest lesion September 16, 2021.  By CT yesterday disease appears progressed Will need repeat MRI-we will plan prior to discharge She is being followed by Dr. Sande Rives notes indicate not a candidate for systemic therapy  Plan; continue clear liquids today until more comfortable with swallowing Pulmonary/critical care to determine need for repeat thoracentesis and/or with rapid reaccumulation possible Pleurx catheter  MRI of the abdomen/attention liver prior to discharge  Continue octreotide Okay to stop PPI infusion and convert to twice daily PPI orally Continue ceftriaxone   Principal Problem:   Secondary esophageal varices with bleeding ([place]) Active Problems:   Cirrhosis ([place])   Hepatocellular carcinoma ([place])   Malignant neoplasm of central portion of right breast in female, estrogen receptor positive ([place])   Thrombocytopenia ([place])   Acute posthemorrhagic anemia   Pleural effusion, left  LOS: 2 days   Amy Esterwood PA-C 10/28/2021, 8:37 AM  GI ATTENDING  Interval history data reviewed.  Patient seen and examined.  Agree with comprehensive interval progress note as outlined above.  Agree with plans as outlined above.   Once daily oral PPI should be sufficient.  Docia Chuck. Geri [place]., M.D. [facility] Division of Gastroenterology

## 2021-10-29 ENCOUNTER — Encounter (HOSPITAL_COMMUNITY): Payer: Self-pay | Admitting: Internal Medicine

## 2021-10-29 ENCOUNTER — Inpatient Hospital Stay (HOSPITAL_COMMUNITY): Payer: BC Managed Care – PPO

## 2021-10-29 ENCOUNTER — Ambulatory Visit: Payer: BC Managed Care – PPO | Admitting: Gastroenterology

## 2021-10-29 DIAGNOSIS — K920 Hematemesis: Secondary | ICD-10-CM

## 2021-10-29 DIAGNOSIS — K7030 Alcoholic cirrhosis of liver without ascites: Secondary | ICD-10-CM | POA: Diagnosis not present

## 2021-10-29 DIAGNOSIS — I8511 Secondary esophageal varices with bleeding: Secondary | ICD-10-CM | POA: Diagnosis not present

## 2021-10-29 DIAGNOSIS — I272 Pulmonary hypertension, unspecified: Secondary | ICD-10-CM

## 2021-10-29 DIAGNOSIS — D62 Acute posthemorrhagic anemia: Secondary | ICD-10-CM | POA: Diagnosis not present

## 2021-10-29 DIAGNOSIS — C220 Liver cell carcinoma: Secondary | ICD-10-CM

## 2021-10-29 LAB — HEMOGLOBIN AND HEMATOCRIT, BLOOD
HCT: 37.4 % (ref 36.0–46.0)
HCT: 32.1 % — ABNORMAL LOW (ref 36.0–46.0)
Hemoglobin: 11.8 g/dL — ABNORMAL LOW (ref 12.0–15.0)
Hemoglobin: 10.4 g/dL — ABNORMAL LOW (ref 12.0–15.0)

## 2021-10-29 LAB — ECHOCARDIOGRAM COMPLETE
Area-P 1/2: 4.41 cm2
Height: 63 in
S' Lateral: 1.8 cm
Weight: 1760.15 oz

## 2021-10-29 LAB — GLUCOSE, CAPILLARY
Glucose-Capillary: 103 mg/dL — ABNORMAL HIGH (ref 70–99)
Glucose-Capillary: 106 mg/dL — ABNORMAL HIGH (ref 70–99)
Glucose-Capillary: 83 mg/dL (ref 70–99)
Glucose-Capillary: 87 mg/dL (ref 70–99)

## 2021-10-29 LAB — CBC WITH DIFFERENTIAL/PLATELET
Abs Immature Granulocytes: 0.04 10*3/uL (ref 0.00–0.07)
Basophils Absolute: 0 10*3/uL (ref 0.0–0.1)
Basophils Relative: 1 %
Eosinophils Absolute: 0.1 10*3/uL (ref 0.0–0.5)
Eosinophils Relative: 1 %
HCT: 30.9 % — ABNORMAL LOW (ref 36.0–46.0)
Hemoglobin: 9.7 g/dL — ABNORMAL LOW (ref 12.0–15.0)
Immature Granulocytes: 1 %
Lymphocytes Relative: 14 %
Lymphs Abs: 1.1 10*3/uL (ref 0.7–4.0)
MCH: 25.4 pg — ABNORMAL LOW (ref 26.0–34.0)
MCHC: 31.4 g/dL (ref 30.0–36.0)
MCV: 80.9 fL (ref 80.0–100.0)
Monocytes Absolute: 0.6 10*3/uL (ref 0.1–1.0)
Monocytes Relative: 8 %
Neutro Abs: 5.9 10*3/uL (ref 1.7–7.7)
Neutrophils Relative %: 75 %
Platelets: 64 10*3/uL — ABNORMAL LOW (ref 150–400)
RBC: 3.82 MIL/uL — ABNORMAL LOW (ref 3.87–5.11)
RDW: 20.2 % — ABNORMAL HIGH (ref 11.5–15.5)
WBC: 7.8 10*3/uL (ref 4.0–10.5)
nRBC: 0 % (ref 0.0–0.2)

## 2021-10-29 LAB — COMPREHENSIVE METABOLIC PANEL
ALT: 18 U/L (ref 0–44)
AST: 34 U/L (ref 15–41)
Albumin: 2.4 g/dL — ABNORMAL LOW (ref 3.5–5.0)
Alkaline Phosphatase: 56 U/L (ref 38–126)
Anion gap: 4 — ABNORMAL LOW (ref 5–15)
BUN: 7 mg/dL (ref 6–20)
CO2: 24 mmol/L (ref 22–32)
Calcium: 6.8 mg/dL — ABNORMAL LOW (ref 8.9–10.3)
Chloride: 105 mmol/L (ref 98–111)
Creatinine, Ser: 0.59 mg/dL (ref 0.44–1.00)
GFR, Estimated: >60 mL/min (ref >60)
Glucose, Bld: 110 mg/dL — ABNORMAL HIGH (ref 70–99)
Potassium: 3.3 mmol/L — ABNORMAL LOW (ref 3.5–5.1)
Sodium: 133 mmol/L — ABNORMAL LOW (ref 135–145)
Total Bilirubin: 1.1 mg/dL (ref 0.3–1.2)
Total Protein: 5.2 g/dL — ABNORMAL LOW (ref 6.5–8.1)

## 2021-10-29 LAB — PROTIME-INR
INR: 1.5 — ABNORMAL HIGH (ref 0.8–1.2)
Prothrombin Time: 18.3 seconds — ABNORMAL HIGH (ref 11.4–15.2)

## 2021-10-29 LAB — PHOSPHORUS: Phosphorus: 2.1 mg/dL — ABNORMAL LOW (ref 2.5–4.6)

## 2021-10-29 LAB — MAGNESIUM: Magnesium: 2.1 mg/dL (ref 1.7–2.4)

## 2021-10-29 MED ORDER — GADOBUTROL 1 MMOL/ML IV SOLN
5.5000 mL | Freq: Once | INTRAVENOUS | Status: AC | PRN
  Administered 2021-10-29: 5.5 mL via INTRAVENOUS

## 2021-10-29 MED ORDER — POTASSIUM CHLORIDE CRYS ER 20 MEQ PO TBCR
40.0000 meq | EXTENDED_RELEASE_TABLET | Freq: Two times a day (BID) | ORAL | Status: AC
  Administered 2021-10-29 (×2): 40 meq via ORAL
  Filled 2021-10-29 (×2): qty 2

## 2021-10-29 MED ORDER — FUROSEMIDE 10 MG/ML IJ SOLN
40.0000 mg | Freq: Four times a day (QID) | INTRAMUSCULAR | Status: AC
  Administered 2021-10-29 (×2): 40 mg via INTRAVENOUS
  Filled 2021-10-29 (×2): qty 4

## 2021-10-29 NOTE — Progress Notes (Signed)
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  She notably had thoracentesis by IR prior to admission with 700 cc taken off L side.  She's now s/p EGD and had second thoracentesis by PCCM on 11/30.   Assessment & Plan:   Principal Problem:   Secondary esophageal varices with bleeding (HCC) Active Problems:   Acute blood loss anemia   Cirrhosis (HCC)   Pleural effusion, left   Thrombocytopenia (HCC)   Malignant neoplasm of central portion of right breast in female, estrogen receptor positive (HCC)   Hepatocellular carcinoma (HCC)   Hypokalemia   Metabolic acidosis   Bowel wall thickening  * Secondary esophageal varices with bleeding ([place]) EGD 11/29 with recent upper GI bleed 2/2 esophageal varices s/p band ligation, incidental distal esophageal ring, mild pyloric inflammation Continue ceftriaxone, octreotide per GI (complete tonight at 9 pm), PPI 40 mg PO daily, will need follow up EGD with band ligation in 4 weeks   Acute blood loss anemia S/p 2 units pRBC and 2 units FFP pm 11/28 and 11/29 respectively Hb stable at this time  Cirrhosis [facility]) thought to be due to ETOH and Hep C Followed by [facility]   Pleural effusion, left Thoracentesis 11/28 by IR Post procedure CXR with trace apical PTX  Reportedly she states that her hepatologist suspects that the recurrent effusions have something to do the the ablation of the hepatocellular cancer CXR 11/30 with moderate L pleural effusion, mild pulm edema Recurrent thoracentesis done 11/30 by pulm, appreciate assistance Repeat CXR 11/30 with small L effusion, decreased from prior  Labs reviewed from 11/2,  suspected transudate Appreciate pulm assistance, follow repeat pleural fluid studies with cytology (looks c/w transudate) - may need to consider TIPS if rapidly reaccumulating  Lasix per pulm Echo pending RUQ [place] with patent portal vein, gallbladder wall thickening, cirrhosis with 2 hepatic lesions, Right effusion  Thrombocytopenia ([place]) Chronic with cirrhosis  Malignant neoplasm of central portion of right breast in female, estrogen receptor positive ([place]) Diagnosed in 2020 Status postlumpectomy and radiation  Metabolic acidosis resolved  Hypokalemia Replace, follwo  Hepatocellular carcinoma ([place]) Per GI notes, this is progressing She is followed by [facility] and underwent an ablation of the dominant hepatic lesion in the left lobe about 1 month ago She has Samantha Olivera second 2.5 cm lesion in the right lobe of the liver with Ihor Meinzer plan for ablation within the next month CT notably with neoplastic lesions within liver per GI, needs MRI abdomen with attention to liver prior to discharge  Bowel wall thickening Noted on CT 11/28, mild thickening of walls of colon and distal small bowel  ? Related to cirrhosis vs enterocolitis  DVT prophylaxis: SCD Code Status: full Family Communication: none at bedside  Status is: Inpatient  Remains inpatient appropriate because: continued need for inpatient monitoring, treatment       Consultants:  GI PCCM  Procedures:  EGD impression 1. Recent upper GI bleed secondary to esophageal varices status post band ligation 2. Incidental distal esophageal ring 3. Mild pyloric inflammation 4. Otherwise normal EGD. Recommendation 1. Patient will return to the hospital ward for ongoing care and observation 2.  Continue IV octreotide for now 3. Convert pantoprazole to 40 mg p.o. daily 4. Continue to monitor stools and hemoglobin 5. Clear liquid diet today 6. Will need follow-up EGD with band ligation in about 4 weeks  Thoracentesis 11/28 by  IR Thoracentesis 11/30 by PCCM  Antimicrobials:  Anti-infectives (From admission, onward)    Start     Dose/Rate Route Frequency Ordered Stop   10/27/21 0003  sodium chloride 0.9 % with cefTRIAXone (ROCEPHIN) ADS Med       Note to Pharmacy: Andre Lefort: cabinet override      10/27/21 0003 10/27/21 1214   10/26/21 2245  cefTRIAXone (ROCEPHIN) 2 g in sodium chloride 0.9 % 100 mL IVPB        2 g 200 mL/hr over 30 Minutes Intravenous Every 24 hours 10/26/21 2239            Subjective: No new complaints  Objective: Vitals:   10/29/21 0400 10/29/21 0700 10/29/21 0730 10/29/21 0800  BP: (!) 95/51 106/67  120/76  Pulse: 76 83  78  Resp: 20 (!) 28  20  Temp: 98 F (36.7 C)  98.1 F (36.7 C)   TempSrc: Oral  Oral   SpO2: 92% 94%  93%  Weight:      Height:        Intake/Output Summary (Last 24 hours) at 10/29/2021 1013 Last data filed at 10/29/2021 9563 Gross per 24 hour  Intake 501.31 ml  Output 4250 ml  Net -3748.69 ml   Filed Weights   10/26/21 1643 10/27/21 1035  Weight: 49.9 kg 49.9 kg    Examination:  General: No acute distress. Cardiovascular: RRR Lungs: diminished on 2 L, unlabored Abdomen: Soft, nontender, nondistended Neurological: Alert and oriented 3. Moves all extremities 4 . Cranial nerves II through XII grossly intact. Skin: Warm and dry. No rashes or lesions. Extremities: No clubbing or cyanosis. No edema.  Data Reviewed: I have personally reviewed following labs and imaging studies  CBC: Recent Labs  Lab 10/26/21 1757 10/27/21 0429 10/27/21 1523 10/28/21 0244 10/28/21 0846 10/28/21 1715 10/29/21 0102 10/29/21 0912  WBC 9.6 8.5  --   --   --  10.3 7.8  --   NEUTROABS  --   --   --   --   --   --  5.9  --   HGB 8.3* 9.1*   < > 9.0* 9.7* 10.8* 9.7* 11.8*  HCT 28.9* 29.3*   < > 30.4* 31.8* 34.5* 30.9* 37.4  MCV 79.0* 81.6  --   --   --  82.1 80.9  --   PLT 124* 60*  --   --   --  71* 64*  --    < > = values in this interval not  displayed.    Basic Metabolic Panel: Recent Labs  Lab 10/26/21 1757 10/27/21 0429 10/28/21 0846 10/29/21 0102  NA 137 139 140 133*  K 4.3 4.3 3.5 3.3*  CL 108 116* 115* 105  CO2 22 20* 20* 24  GLUCOSE 98 119* 106* 110*  BUN 15 14 12 7   CREATININE 0.53 0.54 0.55 0.59  CALCIUM 7.8* 6.3* 6.9* 6.8*  MG  --   --  1.8 2.1  PHOS  --   --   --  2.1*    GFR: Estimated Creatinine Clearance: 63.3 mL/min (by C-G formula based on SCr of 0.59 mg/dL).  Liver Function Tests: Recent Labs  Lab 10/26/21 1757 10/27/21 0429 10/28/21 0846 10/29/21 0102  AST  44* 31 37 34  ALT 23 17 17 18   ALKPHOS 56 45 51 56  BILITOT 1.6* 1.7* 0.9 1.1  PROT 6.6 4.7* 5.4* 5.2*  ALBUMIN 2.8* 2.0* 2.5* 2.4*    CBG: Recent Labs  Lab 10/28/21 0003 10/28/21 0731 10/28/21 1614 10/28/21 2359 10/29/21 0728  GLUCAP 118* 103* 115* 106* 87     Recent Results (from the past 240 hour(s))  Resp Panel by RT-PCR (Flu [place]&B, Covid) Nasopharyngeal Swab     Status: None   Collection Time: 10/26/21  5:57 PM   Specimen: Nasopharyngeal Swab; Nasopharyngeal(NP) swabs in vial transport medium  Result Value Ref Range Status   SARS Coronavirus 2 by RT PCR NEGATIVE NEGATIVE Final    Comment: (NOTE) SARS-CoV-2 target nucleic acids are NOT DETECTED.  The SARS-CoV-2 RNA is generally detectable in upper respiratory specimens during the acute phase of infection. The lowest concentration of SARS-CoV-2 viral copies this assay can detect is 138 copies/mL. Shamiya Demeritt negative result does not preclude SARS-Cov-2 infection and should not be used as the sole basis for treatment or other patient management decisions. Jujhar Everett negative result may occur with  improper specimen collection/handling, submission of specimen other than nasopharyngeal swab, presence of viral mutation(s) within the areas targeted by this assay, and inadequate number of viral copies(<138 copies/mL). Samanthan Dugo negative result must be combined with clinical observations, patient  history, and epidemiological information. The expected result is Negative.  Fact Sheet for Patients:  EntrepreneurPulse.com.au  Fact Sheet for Healthcare Providers:  IncredibleEmployment.be  This test is no t yet approved or cleared by the [place] FDA and  has been authorized for detection and/or diagnosis of SARS-CoV-2 by FDA under an Emergency Use Authorization (EUA). This EUA will remain  in effect (meaning this test can be used) for the duration of the COVID-19 declaration under Section 564(b)(1) of the Act, 21 U.S.C.section 360bbb-3(b)(1), unless the authorization is terminated  or revoked sooner.       Influenza Lorann Tani by PCR NEGATIVE NEGATIVE Final   Influenza B by PCR NEGATIVE NEGATIVE Final    Comment: (NOTE) The Xpert Xpress SARS-CoV-2/FLU/RSV plus assay is intended as an aid in the diagnosis of influenza from Nasopharyngeal swab specimens and should not be used as Jemari Hallum sole basis for treatment. Nasal washings and aspirates are unacceptable for Xpert Xpress SARS-CoV-2/FLU/RSV testing.  Fact Sheet for Patients: EntrepreneurPulse.com.au  Fact Sheet for Healthcare Providers: IncredibleEmployment.be  This test is not yet approved or cleared by the [place] FDA and has been authorized for detection and/or diagnosis of SARS-CoV-2 by FDA under an Emergency Use Authorization (EUA). This EUA will remain in effect (meaning this test can be used) for the duration of the COVID-19 declaration under Section 564(b)(1) of the Act, 21 U.S.C. section 360bbb-3(b)(1), unless the authorization is terminated or revoked.  Performed at [facility], [place] [address], [place], [place] [address]   MRSA Next Gen by PCR, Nasal     Status: None   Collection Time: 10/27/21  3:08 PM   Specimen: Nasal Mucosa; Nasal Swab  Result Value Ref Range Status   MRSA by PCR Next Gen NOT DETECTED NOT DETECTED  Final    Comment: (NOTE) The GeneXpert MRSA Assay (FDA approved for NASAL specimens only), is one component of [place] comprehensive MRSA colonization surveillance program. It is not intended to diagnose MRSA infection nor to guide or monitor treatment for MRSA infections. Test performance is not FDA approved in patients less than 51 years old. Performed at  [facility], [place] [address], [place], [place] [address]   Body fluid culture w Gram Stain     Status: None (Preliminary result)   Collection Time: 10/28/21  2:58 PM   Specimen: Pleural Fluid  Result Value Ref Range Status   Specimen Description   Final    PLEURAL Performed at [place] [address], [place], [place] [address]    Special Requests   Final    NONE Performed at [facility], [place] [address], [place], [place] [address]    Gram Stain   Final    NO SQUAMOUS EPITHELIAL CELLS SEEN FEW WBC SEEN NO ORGANISMS SEEN    Culture   Final    NO GROWTH < 12 HOURS Performed at [facility], [place] [address] [place], [place] [address]    Report Status PENDING  Incomplete         Radiology Studies: DG Chest 1 View  Result Date: 10/28/2021 CLINICAL DATA:  Post left thoracentesis. EXAM: CHEST  1 VIEW COMPARISON:  Chest x-ray 10/28/2021. FINDINGS: There is Zerek Litsey small left pleural effusion which has decreased when compared to the prior study. There is no pneumothorax. Cardiomediastinal silhouette is within normal limits. There is some minimal opacities in the right mid lung and right lower lung which have mildly increased. Surgical clips overlie the right chest. No acute fractures are seen. IMPRESSION: 1. Small left pleural effusion, decreased from prior. 2. No pneumothorax. 3. Mild increase in right mid and lower lung airspace disease. Electronically Signed   By: Ronney Asters M.D.   On: 10/28/2021 15:44   DG CHEST PORT 1 VIEW  Result Date: 10/28/2021 CLINICAL DATA:   Cough and shortness of breath EXAM: PORTABLE CHEST 1 VIEW COMPARISON:  10/26/2021 FINDINGS: Significantly increased left pleural effusion and associated atelectasis. Suspect mild pulmonary edema. Increased heart size. IMPRESSION: Increased, now moderate left pleural effusion with associated atelectasis. Suspect mild pulmonary edema, noting increased heart size. Electronically Signed   By: Macy Mis M.D.   On: 10/28/2021 09:06   US Abdomen Limited RUQ (LIVER/GB)  Result Date: 10/29/2021 CLINICAL DATA:  Assess for portal vein thrombosis. EXAM: ULTRASOUND ABDOMEN LIMITED RIGHT UPPER QUADRANT COMPARISON:  Abdomen/pelvis CT 10/26/2021 FINDINGS: Gallbladder: Gallbladder wall is thickened and edematous measuring up to 8 mm. Sonographer reports no sonographic Murphy sign. No evidence for gallstones. Ascites/pericholecystic fluid evident. Common bile duct: Diameter: 6 mm Liver: Nodular liver contour with echogenic parenchyma. Posterior right hepatic lobe lesion measuring 3.7 cm was better characterized on recent CT. 2.0 cm heterogeneous lesion identified lateral segment left liver, also noted on prior CT. Portal vein is patent on color Doppler imaging with normal direction of blood flow towards the liver. Other: Right pleural effusion. IMPRESSION: 1. Main portal vein is patent. 2. Gallbladder wall thickening with pericholecystic fluid. This is Mitchell Epling nonspecific finding in the setting of cirrhosis and ascites. No gallstones are evident. 3. Cirrhosis with 2 hepatic lesions identified, better characterized on previous MRI. 4. Right pleural effusion. Electronically Signed   By: Misty Stanley M.D.   On: 10/29/2021 08:19        Scheduled Meds:  Chlorhexidine Gluconate Cloth  6 each Topical Daily   furosemide  40 mg Intravenous Q6H   mouth rinse  15 mL Mouth Rinse BID   nicotine  21 mg Transdermal Daily   pantoprazole  40 mg Oral Daily   potassium chloride  40 mEq Oral BID   tamoxifen  20 mg Oral Daily    Continuous Infusions:  cefTRIAXone (ROCEPHIN)  IV Stopped (10/29/21 0035)   octreotide  (SANDOSTATIN)    IV infusion 50 mcg/hr (10/29/21 0449)     LOS: 3 days    Time spent: over 30 min    Fayrene Helper, MD [facility]   To contact the attending provider between 7A-7P or the covering provider during after hours 7P-7A, please log into the web site [email or web address] and access using universal [place] password for that web site. If you do not have the password, please call the hospital operator.  10/29/2021, 10:13 AM

## 2021-10-29 NOTE — Plan of Care (Signed)

## 2021-10-29 NOTE — TOC Progression Note (Signed)
Transition of Care [facility]) - Progression Note    Patient Details  Name: Theresa Rocha MRN: [id number] Date of Birth: Oct 17, 1966  [facility] of [facility]) CM/SW Contact  Purcell Mouton, RN Phone Number: 10/29/2021, 12:45 PM  Clinical Narrative:     TOC reviewed chart and will follow for discharge needs.   Expected Discharge Plan: Home/Self Care Barriers to Discharge: No Barriers Identified  Expected Discharge Plan and Services Expected Discharge Plan: Home/Self Care       Living arrangements for the past 2 months: Single Family Home                                       Social Determinants of Health (SDOH) Interventions    Readmission Risk Interventions No flowsheet data found.

## 2021-10-29 NOTE — Progress Notes (Signed)
  Echocardiogram 2D Echocardiogram has been performed.  Theresa Rocha 10/29/2021, 1:41 PM

## 2021-10-29 NOTE — Assessment & Plan Note (Signed)
Replace, follwo

## 2021-10-29 NOTE — Progress Notes (Addendum)
[facility] Progress Note  CC:  Acute upper GI bleed, cirrhosis, hepatocellular carcinoma    Subjective: She reports feeling much better today after repeat thoracentesis and after receiving Lasix with increased urine output. No cough or SOB this morning. No N/V. No further hematemesis. No upper or lower abdominal pain. She has mild esophageal discomfort when swallowing liquids since she underwent an EGD 11/29. No further melena yesterday or today.    Objective:  S/P EGD 10/27/2021: There were 3 columns of grade II varices were found in the lower third of the esophagus. One of the varix was slightly more prominent and had stigmata. There was no active bleeding. After completing the entire endoscopic survey, Three bands were successfully placed on the high risk varix with incomplete eradication of varices overall. There was no bleeding during the procedure. Findings: There was an incidental ringlike esophageal stricture at the gastroesophageal junction. There was mild inflammation in the pyloric region. The stomach was otherwise normal with no evidence of proximal gastric varices or significant portal hypertensive gastropathy. The examined duodenum was normal. The cardia and gastric fundus were normal on retroflexion save small hiatal hernia.  Vital signs in last 24 hours: Temp:  [98 F (36.7 C)-98.4 F (36.9 C)] 98 F (36.7 C) (12/01 0400) Pulse Rate:  [76-94] 76 (12/01 0400) Resp:  [13-27] 20 (12/01 0400) BP: (89-138)/(51-86) 95/51 (12/01 0400) SpO2:  [86 %-98 %] 92 % (12/01 0400) Last BM Date: 10/27/21 General:   Alert 55 year old female in NAD. Eyes: No scleral icterus. PERRLA.  Heart: Regular rate and rhythm, no murmurs Pulm: Diminished breath sounds to the left lower lobe otherwise clear.  On oxygen 2 L nasal cannula. Abdomen: Soft, nondistended.  Nontender.  Positive bowel sounds to all 4 quadrants.  No palpable masses or hepatosplenomegaly. Extremities:  No lower extremity edema. Neurologic:  Alert and  oriented x4. Grossly normal neurologically. Psych:  Alert and cooperative. Normal mood and affect.  Intake/Output from previous day: 11/30 0701 - 12/01 0700 In: 528.7 [P.O.:220; I.V.:258.7; IV Piggyback:50] Out: 3300 [Urine:3300] Intake/Output this shift: No intake/output data recorded.  Lab Results: Recent Labs    10/27/21 0429 10/27/21 1523 10/28/21 0846 10/28/21 1715 10/29/21 0102  WBC 8.5  --   --  10.3 7.8  HGB 9.1*   < > 9.7* 10.8* 9.7*  HCT 29.3*   < > 31.8* 34.5* 30.9*  PLT 60*  --   --  71* 64*   < > = values in this interval not displayed.   BMET Recent Labs    10/27/21 0429 10/28/21 0846 10/29/21 0102  NA 139 140 133*  K 4.3 3.5 3.3*  CL 116* 115* 105  CO2 20* 20* 24  GLUCOSE 119* 106* 110*  BUN $Re'14 12 7  'Bvj$ CREATININE 0.54 0.55 0.59  CALCIUM 6.3* 6.9* 6.8*   LFT Recent Labs    10/29/21 0102  PROT 5.2*  ALBUMIN 2.4*  AST 34  ALT 18  ALKPHOS 56  BILITOT 1.1   PT/INR Recent Labs    10/28/21 0244 10/29/21 0102  LABPROT 18.8* 18.3*  INR 1.6* 1.5*   Hepatitis Panel No results for input(s): HEPBSAG, HCVAB, HEPAIGM, HEPBIGM in the last 72 hours.  DG Chest 1 View  Result Date: 10/28/2021 CLINICAL DATA:  Post left thoracentesis. EXAM: CHEST  1 VIEW COMPARISON:  Chest x-ray 10/28/2021. FINDINGS: There is a small left pleural effusion which has decreased when compared to the prior study. There is no pneumothorax.  Cardiomediastinal silhouette is within normal limits. There is some minimal opacities in the right mid lung and right lower lung which have mildly increased. Surgical clips overlie the right chest. No acute fractures are seen. IMPRESSION: 1. Small left pleural effusion, decreased from prior. 2. No pneumothorax. 3. Mild increase in right mid and lower lung airspace disease. Electronically Signed   By: Theresa Rocha M.D.   On: 10/28/2021 15:44   DG CHEST PORT 1 VIEW  Result Date:  10/28/2021 CLINICAL DATA:  Cough and shortness of breath EXAM: PORTABLE CHEST 1 VIEW COMPARISON:  10/26/2021 FINDINGS: Significantly increased left pleural effusion and associated atelectasis. Suspect mild pulmonary edema. Increased heart size. IMPRESSION: Increased, now moderate left pleural effusion with associated atelectasis. Suspect mild pulmonary edema, noting increased heart size. Electronically Signed   By: Theresa Rocha M.D.   On: 10/28/2021 09:06    Assessment / Plan:  20) 55 year old female with decompensated cirrhosis secondary to hepatitis C/EtOH and multifocal hepatocellular carcinoma s/p TACE admitted to the hospital 10/26/2021 with an acute UGI bleed/hematemesis with hypotension and tachycardia. Admission Hg 8.3. Transfused 2 units of PRBCs -> Hg 9.1 -> 9.7. -> 10.8 -> Hg today 9.7. S/P EGD 11/29 identified 3 columns of grade 2 varices, 1 column with stigmata of recent bleeding which was banded x 3. No significant portal gastropathy. On Octreotide infusion. Stable Hg 9.7. Alk phos 56. T. Bili 1.1. AST 34. ALT 18. GGT 230.  Hemodynamically stable. -Continue Octreotide infusion, discontinue this evening at 9pm (72 hours after initiation) if no active GI  bleeding  -Continue Pantoprazole po QD -Continue Ceftriaxone 2gm IV  -Continue Furosemide 40mg  Q 6 hrs IV -CBC in AM, BMP -Transfuse for Hg < 7 -Advance to full liquid diet, if tolerated may further advance to a soft diet later today -Repeat EGD with band ligation in 4 weeks -Abdominal MRI prior to discharge  -Further recommendations per Dr. Henrene Rocha  2) Recurrent left pleural effusion s/p thoracentesis 10/01/2021 (1.2L pleural fluid removed), 11/14 (1.8L pleural fluid removed), 10/26/2021 (700cc pleural fluid removed) and 11/30 (1.6L pleural fluid removed). 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 Cytology pending. No evidence of spontaneous bacterial pleuritis.   -ECHO ordered by Dr. Ina Rocha to rule out heart failure prior to possible evaluation for future TIPS -Continue Lasix as tolerated  3) Thrombocytopenia secondary to cirrhosis. PLT 64.   4) Enterocolitis per CTAP 10/26/2021. No abdominal pain or diarrhea.   5) Coagulopathy secondary to cirrhosis. Transfused 2 units of FFP 10/27/2021. INR 1.8 -> 1.5.   6) History of breast cancer s/p lumpectomy and radiation 2020 on Tamoxifen   7) Hypokalemia. K + 3.3.  -KCL replacement per the hospitalist      Principal Problem:   Secondary esophageal varices with bleeding (HCC) Active Problems:   Cirrhosis (HCC)   Hepatocellular carcinoma (HCC)   Malignant neoplasm of central portion of right breast in female, estrogen receptor positive (HCC)   Thrombocytopenia (HCC)   Acute blood loss anemia   Pleural effusion, left   Bowel wall thickening   Metabolic acidosis     LOS: 3 days   Theresa Rocha  10/29/2021, 08:43 AM  GI ATTENDING  Interval history data reviewed.  Agree with interval progress note as outlined above.  The patient is stable after upper GI bleed secondary to varices.  Other issues as outlined.  Agree with recommendations as outlined.  Strictly from GI standpoint,  if no further problems overnight, may be able to go home tomorrow.  Docia Chuck. Geri Seminole., M.D. [facility]

## 2021-10-29 NOTE — Progress Notes (Signed)
   NAME:  Theresa Rocha, MRN:  [id number], DOB:  06/25/1966, LOS: 3 ADMISSION DATE:  10/26/2021, CONSULTATION DATE:  10/26/21 REFERRING MD:  Rogene Houston CHIEF COMPLAINT:  hematemesis  History of Present Illness:  55 y/o F whop presented to [facility] ER on 11/28 with reports of hematemesis.  She has a known history of hepatitis C+, esophageal varices and hepatocellular carcinoma.  Prior to presentation, she had two episodes of hematemesis, frequent bouts of black tarry stools. In the ER she had two episodes of hematemesis.  Her baseline BP normally runs 277 systolic.  She had a left pleural effusion on presentation which was evacuated with thoracentesis (700 ml pleural fluid, no cytology/labs sent). She had a prior thoracentesis on 09/30/21 which was a transudate and positive for atypical cells but favored to be reactive mesothelial cells.  PCCM initially saw for critical care evaluation- BP improved with PRBC resuscitation.  She was stable and admitted per [facility].  On 11/30 CXR showed re accumulation of left pleural fluid.  PCCM called back 11/30 for evaluation of effusion.    Pertinent  Medical History  Reported hx of esophageal varices.  Pleural effusion Iron deficiency anemia Throbmocytopenia Right Breast cancer (on tamoxifen) Hepatocellular carcinoma Cirrhosis Chronic hep C  GERD ETOH hepatitis (?)  Significant Hospital Events: Including procedures, antibiotic start and stop dates in addition to other pertinent events   11/28 Admit  11/30 PCCM re-eval for left effusion  Interim History / Subjective:  No events. Breathing improved after thora. Good diuresis.  Objective   Blood pressure (!) 95/51, pulse 76, temperature 98 F (36.7 C), temperature source Oral, resp. rate 20, height 5\' 3"  (1.6 m), weight 49.9 kg, last menstrual period 04/20/2013, SpO2 92 %.        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  Exacerbated by fluid overload in setting of UGIB rescuscitation.  - Continue diuresis - f/u RUQ [place] r/o portal occlusion - f/u echo to make sure RV can tolerate TIPS - Check AM CXR - If we achieve euvolemia, L effusion keeps recurring and RUQ shows nothing actionable, I think she should be evaluated for TIPS - f/u repeat cytology on fluid for completeness' sake - UGIB management per GI/primary - Will follow    Candee Furbish, MD 10/29/2021 7:22 AM

## 2021-10-30 ENCOUNTER — Inpatient Hospital Stay (HOSPITAL_COMMUNITY): Payer: BC Managed Care – PPO

## 2021-10-30 ENCOUNTER — Ambulatory Visit: Payer: BC Managed Care – PPO | Admitting: Hematology

## 2021-10-30 ENCOUNTER — Other Ambulatory Visit: Payer: BC Managed Care – PPO

## 2021-10-30 ENCOUNTER — Telehealth: Payer: Self-pay | Admitting: Nurse Practitioner

## 2021-10-30 DIAGNOSIS — J90 Pleural effusion, not elsewhere classified: Secondary | ICD-10-CM | POA: Diagnosis not present

## 2021-10-30 DIAGNOSIS — I959 Hypotension, unspecified: Secondary | ICD-10-CM

## 2021-10-30 DIAGNOSIS — R6510 Systemic inflammatory response syndrome (SIRS) of non-infectious origin without acute organ dysfunction: Secondary | ICD-10-CM

## 2021-10-30 DIAGNOSIS — D62 Acute posthemorrhagic anemia: Secondary | ICD-10-CM | POA: Diagnosis not present

## 2021-10-30 DIAGNOSIS — I8511 Secondary esophageal varices with bleeding: Secondary | ICD-10-CM | POA: Diagnosis not present

## 2021-10-30 DIAGNOSIS — K7030 Alcoholic cirrhosis of liver without ascites: Secondary | ICD-10-CM | POA: Diagnosis not present

## 2021-10-30 DIAGNOSIS — C220 Liver cell carcinoma: Secondary | ICD-10-CM | POA: Diagnosis not present

## 2021-10-30 LAB — GLUCOSE, CAPILLARY
Glucose-Capillary: 80 mg/dL (ref 70–99)
Glucose-Capillary: 151 mg/dL — ABNORMAL HIGH (ref 70–99)
Glucose-Capillary: 97 mg/dL (ref 70–99)

## 2021-10-30 LAB — RESP PANEL BY RT-PCR (FLU A&B, COVID) ARPGX2
Influenza A by PCR: NEGATIVE
Influenza B by PCR: NEGATIVE
SARS Coronavirus 2 by RT PCR: POSITIVE — AB

## 2021-10-30 LAB — URINALYSIS, COMPLETE (UACMP) WITH MICROSCOPIC
Bacteria, UA: NONE SEEN
Bilirubin Urine: NEGATIVE
Glucose, UA: NEGATIVE mg/dL
Hgb urine dipstick: NEGATIVE
Ketones, ur: NEGATIVE mg/dL
Leukocytes,Ua: NEGATIVE
Nitrite: NEGATIVE
Protein, ur: NEGATIVE mg/dL
Specific Gravity, Urine: 1.004 — ABNORMAL LOW (ref 1.005–1.030)
pH: 6 (ref 5.0–8.0)

## 2021-10-30 LAB — COMPREHENSIVE METABOLIC PANEL
ALT: 16 U/L (ref 0–44)
AST: 33 U/L (ref 15–41)
Albumin: 2.5 g/dL — ABNORMAL LOW (ref 3.5–5.0)
Alkaline Phosphatase: 58 U/L (ref 38–126)
Anion gap: 6 (ref 5–15)
BUN: 8 mg/dL (ref 6–20)
CO2: 24 mmol/L (ref 22–32)
Calcium: 7 mg/dL — ABNORMAL LOW (ref 8.9–10.3)
Chloride: 102 mmol/L (ref 98–111)
Creatinine, Ser: 0.61 mg/dL (ref 0.44–1.00)
GFR, Estimated: >60 mL/min (ref >60)
Glucose, Bld: 134 mg/dL — ABNORMAL HIGH (ref 70–99)
Potassium: 4.1 mmol/L (ref 3.5–5.1)
Sodium: 132 mmol/L — ABNORMAL LOW (ref 135–145)
Total Bilirubin: 1.2 mg/dL (ref 0.3–1.2)
Total Protein: 5.4 g/dL — ABNORMAL LOW (ref 6.5–8.1)

## 2021-10-30 LAB — CBC WITH DIFFERENTIAL/PLATELET
Abs Immature Granulocytes: 0.03 10*3/uL (ref 0.00–0.07)
Basophils Absolute: 0 10*3/uL (ref 0.0–0.1)
Basophils Relative: 1 %
Eosinophils Absolute: 0.1 10*3/uL (ref 0.0–0.5)
Eosinophils Relative: 1 %
HCT: 31.7 % — ABNORMAL LOW (ref 36.0–46.0)
Hemoglobin: 10 g/dL — ABNORMAL LOW (ref 12.0–15.0)
Immature Granulocytes: 0 %
Lymphocytes Relative: 9 %
Lymphs Abs: 0.7 10*3/uL (ref 0.7–4.0)
MCH: 25.9 pg — ABNORMAL LOW (ref 26.0–34.0)
MCHC: 31.5 g/dL (ref 30.0–36.0)
MCV: 82.1 fL (ref 80.0–100.0)
Monocytes Absolute: 0.8 10*3/uL (ref 0.1–1.0)
Monocytes Relative: 10 %
Neutro Abs: 6.5 10*3/uL (ref 1.7–7.7)
Neutrophils Relative %: 79 %
Platelets: 76 10*3/uL — ABNORMAL LOW (ref 150–400)
RBC: 3.86 MIL/uL — ABNORMAL LOW (ref 3.87–5.11)
RDW: 21.2 % — ABNORMAL HIGH (ref 11.5–15.5)
WBC: 8.1 10*3/uL (ref 4.0–10.5)
nRBC: 0 % (ref 0.0–0.2)

## 2021-10-30 LAB — HEMOGLOBIN AND HEMATOCRIT, BLOOD
HCT: 31.8 % — ABNORMAL LOW (ref 36.0–46.0)
Hemoglobin: 10 g/dL — ABNORMAL LOW (ref 12.0–15.0)

## 2021-10-30 LAB — PROTIME-INR
INR: 1.5 — ABNORMAL HIGH (ref 0.8–1.2)
Prothrombin Time: 18.2 seconds — ABNORMAL HIGH (ref 11.4–15.2)

## 2021-10-30 LAB — PHOSPHORUS: Phosphorus: 1.8 mg/dL — ABNORMAL LOW (ref 2.5–4.6)

## 2021-10-30 LAB — CYTOLOGY - NON PAP

## 2021-10-30 LAB — MAGNESIUM: Magnesium: 1.9 mg/dL (ref 1.7–2.4)

## 2021-10-30 MED ORDER — SUCRALFATE 1 GM/10ML PO SUSP
1.0000 g | Freq: Two times a day (BID) | ORAL | Status: DC
  Administered 2021-10-30 – 2021-11-02 (×7): 1 g via ORAL
  Filled 2021-10-30 (×7): qty 10

## 2021-10-30 MED ORDER — PIPERACILLIN-TAZOBACTAM 3.375 G IVPB
3.3750 g | Freq: Three times a day (TID) | INTRAVENOUS | Status: DC
  Administered 2021-10-30 – 2021-10-31 (×3): 3.375 g via INTRAVENOUS
  Filled 2021-10-30 (×4): qty 50

## 2021-10-30 MED ORDER — SODIUM CHLORIDE 0.9 % IV BOLUS: 500.0000 mL | Freq: Once | INTRAVENOUS | Status: DC

## 2021-10-30 MED ORDER — MAGNESIUM SULFATE 2 GM/50ML IV SOLN
2.0000 g | Freq: Once | INTRAVENOUS | Status: AC
  Administered 2021-10-30: 2 g via INTRAVENOUS
  Filled 2021-10-30: qty 50

## 2021-10-30 MED ORDER — OXYCODONE HCL 5 MG PO TABS
5.0000 mg | ORAL_TABLET | ORAL | Status: DC | PRN
  Administered 2021-10-30 – 2021-11-01 (×3): 5 mg via ORAL
  Filled 2021-10-30 (×4): qty 1

## 2021-10-30 MED ORDER — ALBUMIN HUMAN 25 % IV SOLN
25.0000 g | Freq: Four times a day (QID) | INTRAVENOUS | Status: AC
  Administered 2021-10-30 (×2): 25 g via INTRAVENOUS
  Filled 2021-10-30 (×2): qty 100

## 2021-10-30 MED ORDER — K PHOS MONO-SOD PHOS DI & MONO 155-852-130 MG PO TABS
500.0000 mg | ORAL_TABLET | Freq: Once | ORAL | Status: AC
  Administered 2021-10-30: 500 mg via ORAL
  Filled 2021-10-30: qty 2

## 2021-10-30 MED ORDER — FUROSEMIDE 10 MG/ML IJ SOLN
40.0000 mg | Freq: Four times a day (QID) | INTRAMUSCULAR | Status: AC
  Administered 2021-10-30 (×2): 40 mg via INTRAVENOUS
  Filled 2021-10-30 (×2): qty 4

## 2021-10-30 MED ORDER — SODIUM CHLORIDE 0.9 % IV BOLUS
500.0000 mL | Freq: Once | INTRAVENOUS | Status: AC
  Administered 2021-10-30: 500 mL via INTRAVENOUS

## 2021-10-30 MED ORDER — MIDODRINE HCL 5 MG PO TABS
5.0000 mg | ORAL_TABLET | Freq: Three times a day (TID) | ORAL | Status: DC
  Administered 2021-10-30 – 2021-10-31 (×4): 5 mg via ORAL
  Filled 2021-10-30 (×4): qty 1

## 2021-10-30 MED ORDER — ACETAMINOPHEN 500 MG PO TABS
500.0000 mg | ORAL_TABLET | Freq: Two times a day (BID) | ORAL | Status: AC | PRN
  Administered 2021-10-30 – 2021-10-31 (×2): 500 mg via ORAL
  Filled 2021-10-30 (×2): qty 1

## 2021-10-30 NOTE — Progress Notes (Signed)
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  She notably had thoracentesis by IR prior to admission with 700 cc taken off L side.  She's now s/p EGD and had second thoracentesis by PCCM on 11/30.   Assessment & Plan:   Principal Problem:   Secondary esophageal varices with bleeding (HCC) Active Problems:   Hematemesis with nausea   SIRS (systemic inflammatory response syndrome) (HCC)   Acute blood loss anemia   Hypotension   Cirrhosis (HCC)   Pleural effusion on left   Thrombocytopenia (HCC)   Malignant neoplasm of central portion of right breast in female, estrogen receptor positive (HCC)   Hepatocellular carcinoma (HCC)   Hypokalemia   Metabolic acidosis   Bowel wall thickening  * Secondary esophageal varices with bleeding ([place]) EGD 11/29 with recent upper GI bleed 2/2 esophageal varices s/p band ligation, incidental distal esophageal ring, mild pyloric inflammation On ceftriaxone, octreotide complete, PPI 40 mg PO daily, will need follow up EGD with band ligation in 4 weeks.  Per GI, needs to follow up in about 2 weeks, repeat banding in 4 weeks.   SIRS (systemic inflammatory response syndrome) (HCC) Fever to 101.1, tachypnea, hypoxia Normal WBC count CXR with bilateral interstitial opacities, moderate L effusion Follow blood cultures and covid/influenza testing US paracentesis, though she's already on SBP ppx Continue ceftriaxone for now  Hypotension Start midodrine, albumin 2/2 cirrhosis Follow closely   Acute blood loss anemia S/p 2 units pRBC and 2 units FFP pm 11/28 and 11/29 respectively Hb stable at this time  Cirrhosis [facility]) thought to  be due to ETOH and Hep C [facility]   Pleural effusion on left Thoracentesis 11/28 by IR Post procedure CXR with trace apical PTX  Reportedly she states that her hepatologist suspects that the recurrent effusions have something to do the the ablation of the hepatocellular cancer Recurrent thoracentesis done 11/30 by pulm, appreciate assistance CXR 12/2 with moderate L effusion, mild bilateral interstitial opacities Labs reviewed from 11/2, suspected transudate Appreciate pulm assistance, follow repeat pleural fluid studies with cytology (reactive mesothelial cells) (looks c/w transudate) - may need to consider TIPS if rapidly reaccumulating  Continue lasix (with albumin/midodrine with hypotension) Echo with normal EF, RVSF normal, moderate L effusion) RUQ [place] with patent portal vein, gallbladder wall thickening, cirrhosis with 2 hepatic lesions, Right effusion  Thrombocytopenia (HCC) Chronic with cirrhosis  Malignant neoplasm of central portion of right breast in female, estrogen receptor positive ([place]) Diagnosed in 2020 Status postlumpectomy and radiation  Metabolic acidosis resolved  Hypokalemia Replace, follwo  Hepatocellular carcinoma ([place]) Per GI notes, this is progressing She is followed by [facility] and underwent an ablation of the dominant hepatic lesion in the left lobe about 1 month ago She has Treyvone Chelf second 2.5 cm lesion in the right lobe of the liver with Romaldo Saville plan for ablation within the next month CT notably with neoplastic lesions within liver MRI with evidence of hepatocellular carcinoma, increasing in size Follows with [name]  Bowel wall thickening Noted on CT 11/28, mild thickening of walls of colon and distal small bowel  ? Related to cirrhosis vs enterocolitis  DVT prophylaxis: SCD Code Status: full Family Communication: none at bedside  Status is: Inpatient  Remains inpatient appropriate because: continued need for inpatient  monitoring, treatment       Consultants:  GI PCCM  Procedures:  EGD impression 1. Recent upper GI bleed secondary to esophageal varices status post band ligation 2. Incidental distal esophageal ring 3. Mild pyloric inflammation 4. Otherwise normal EGD. Recommendation 1. Patient will return to the hospital ward for ongoing care and observation 2. Continue IV octreotide for now 3. Convert pantoprazole to 40 mg p.o. daily 4. Continue to monitor stools and hemoglobin 5. Clear liquid diet today 6. Will need follow-up EGD with band ligation in about 4 weeks  Thoracentesis 11/28 by IR Thoracentesis 11/30 by PCCM  Echo IMPRESSIONS     1. Left ventricular ejection fraction, by estimation, is 60 to 65%. The  left ventricle has normal function. The left ventricle has no regional  wall motion abnormalities. Left ventricular diastolic parameters were  normal.   2. Right ventricular systolic function is normal. The right ventricular  size is normal. Tricuspid regurgitation signal is inadequate for assessing  PA pressure.   3. Moderate pleural effusion in the left lateral region.   4. The mitral valve is normal in structure. No evidence of mitral valve  regurgitation. No evidence of mitral stenosis.   5. The aortic valve is tricuspid. Aortic valve regurgitation is not  visualized. No aortic stenosis is present.   6. The inferior vena cava is normal in size with greater than 50%  respiratory variability, suggesting right atrial pressure of 3 mmHg.   Comparison(s): No prior Echocardiogram.   Antimicrobials:  Anti-infectives (From admission, onward)    Start     Dose/Rate Route Frequency Ordered Stop   10/27/21 0003  sodium chloride 0.9 % with cefTRIAXone (ROCEPHIN) ADS Med       Note to Pharmacy: Andre Lefort: cabinet override      10/27/21 0003 10/27/21 1214   10/26/21 2245  cefTRIAXone (ROCEPHIN) 2 g in sodium chloride 0.9 % 100 mL IVPB        2 g 200 mL/hr over 30  Minutes Intravenous Every 24 hours 10/26/21 2239            Subjective: Notes fever/chills this afternoon  Objective: Vitals:   10/30/21 1000 10/30/21 1100 10/30/21 1308 10/30/21 1400  BP: 112/67 103/69    Pulse: 85 94    Resp: (!) 25 20    Temp:   (!) 101.1 F (38.4 C) 100.1 F (37.8 C)  TempSrc:   Oral Oral  SpO2: (!) 78% (!) 88%    Weight:      Height:        Intake/Output Summary (Last 24 hours) at 10/30/2021 1510 Last data filed at 10/30/2021 1417 Gross per 24 hour  Intake 922.72 ml  Output 1550 ml  Net -627.28 ml   Filed Weights   10/26/21 1643 10/27/21 1035  Weight: 49.9 kg 49.9 kg    Examination:  General: No acute distress. Cardiovascular: RRR Lungs: diminished on 2 L  Abdomen: Soft, nontender, nondistended Neurological: Alert and oriented 3. Moves all extremities 4 with equal strength. Cranial nerves II through XII grossly intact. Skin: Warm and dry. No rashes or lesions.   Data Reviewed: I have personally reviewed following labs and imaging studies  CBC: Recent Labs  Lab 10/26/21 1757 10/27/21 0429 10/27/21 1523 10/28/21 1715 10/29/21 0102 10/29/21 0912 10/29/21 1716 10/30/21 0055  WBC 9.6 8.5  --  10.3 7.8  --   --  8.1  NEUTROABS  --   --   --   --  5.9  --   --  6.5  HGB 8.3* 9.1*   < > 10.8* 9.7* 11.8* 10.4* 10.0*  10.0*  HCT 28.9* 29.3*   < > 34.5* 30.9* 37.4 32.1* 31.7*  31.8*  MCV 79.0* 81.6  --  82.1 80.9  --   --  82.1  PLT 124* 60*  --  71* 64*  --   --  76*   < > = values in this interval not displayed.    Basic Metabolic Panel: Recent Labs  Lab 10/26/21 1757 10/27/21 0429 10/28/21 0846 10/29/21 0102 10/30/21 0055  NA 137 139 140 133* 132*  K 4.3 4.3 3.5 3.3* 4.1  CL 108 116* 115* 105 102  CO2 22 20* 20* 24 24  GLUCOSE 98 119* 106* 110* 134*  BUN 15 14 12 7 8   CREATININE 0.53 0.54 0.55 0.59 0.61  CALCIUM 7.8* 6.3* 6.9* 6.8* 7.0*  MG  --   --  1.8 2.1 1.9  PHOS  --   --   --  2.1* 1.8*    GFR: Estimated  Creatinine Clearance: 63.3 mL/min (by C-G formula based on SCr of 0.61 mg/dL).  Liver Function Tests: Recent Labs  Lab 10/26/21 1757 10/27/21 0429 10/28/21 0846 10/29/21 0102 10/30/21 0055  AST 44* 31 37 34 33  ALT 23 17 17 18 16   ALKPHOS 56 45 51 56 58  BILITOT 1.6* 1.7* 0.9 1.1 1.2  PROT 6.6 4.7* 5.4* 5.2* 5.4*  ALBUMIN 2.8* 2.0* 2.5* 2.4* 2.5*    CBG: Recent Labs  Lab 10/29/21 0728 10/29/21 1514 10/29/21 2341 10/30/21 0752 10/30/21 1212  GLUCAP 87 103* 83 80 151*     Recent Results (from the past 240 hour(s))  Resp Panel by RT-PCR (Flu Jarrette Dehner&B, Covid) Nasopharyngeal Swab     Status: None   Collection Time: 10/26/21  5:57 PM   Specimen: Nasopharyngeal Swab; Nasopharyngeal(NP) swabs in vial transport medium  Result Value Ref Range Status   SARS Coronavirus 2 by RT PCR NEGATIVE NEGATIVE Final    Comment: (NOTE) SARS-CoV-2 target nucleic acids are NOT DETECTED.  The SARS-CoV-2 RNA is generally detectable in upper respiratory specimens during the acute phase of infection. The lowest concentration of SARS-CoV-2 viral copies this assay can detect is 138 copies/mL. Osker Ayoub negative result does not preclude SARS-Cov-2 infection and should not be used as the sole basis for treatment or other patient management decisions. Lametria Klunk negative result may occur with  improper specimen collection/handling, submission of specimen other than nasopharyngeal swab, presence of viral mutation(s) within the areas targeted by this assay, and inadequate number of viral copies(<138 copies/mL). Keshauna Degraffenreid negative result must be combined with clinical observations, patient history, and epidemiological information. The expected result is Negative.  Fact Sheet for Patients:  EntrepreneurPulse.com.au  Fact Sheet for Healthcare Providers:  IncredibleEmployment.be  This test is no t yet approved or cleared by the [place] FDA and  has been authorized for detection and/or  diagnosis of SARS-CoV-2 by FDA under an Emergency Use Authorization (EUA). This EUA will remain  in effect (meaning this test can be used) for the duration of the COVID-19 declaration under Section 564(b)(1) of the Act, 21 U.S.C.section 360bbb-3(b)(1), unless the authorization is terminated  or revoked sooner.       Influenza Jeramey Lanuza by PCR NEGATIVE NEGATIVE Final   Influenza B by PCR NEGATIVE  NEGATIVE Final    Comment: (NOTE) The Xpert Xpress SARS-CoV-2/FLU/RSV plus assay is intended as an aid in the diagnosis of influenza from Nasopharyngeal swab specimens and should not be used as Marlayna Bannister sole basis for treatment. Nasal washings and aspirates are unacceptable for Xpert Xpress SARS-CoV-2/FLU/RSV testing.  Fact Sheet for Patients: EntrepreneurPulse.com.au  Fact Sheet for Healthcare Providers: IncredibleEmployment.be  This test is not yet approved or cleared by the [place] FDA and has been authorized for detection and/or diagnosis of SARS-CoV-2 by FDA under an Emergency Use Authorization (EUA). This EUA will remain in effect (meaning this test can be used) for the duration of the COVID-19 declaration under Section 564(b)(1) of the Act, 21 U.S.C. section 360bbb-3(b)(1), unless the authorization is terminated or revoked.  Performed at [facility], [place] [address], [place], [place] [address]   MRSA Next Gen by PCR, Nasal     Status: None   Collection Time: 10/27/21  3:08 PM   Specimen: Nasal Mucosa; Nasal Swab  Result Value Ref Range Status   MRSA by PCR Next Gen NOT DETECTED NOT DETECTED Final    Comment: (NOTE) The GeneXpert MRSA Assay (FDA approved for NASAL specimens only), is one component of Jaesean Litzau comprehensive MRSA colonization surveillance program. It is not intended to diagnose MRSA infection nor to guide or monitor treatment for MRSA infections. Test performance is not FDA approved in patients less than 63  years old. Performed at [facility], [place] [address] [place], [place] [address]   Body fluid culture w Gram Stain     Status: None (Preliminary result)   Collection Time: 10/28/21  2:58 PM   Specimen: Pleural Fluid  Result Value Ref Range Status   Specimen Description   Final    PLEURAL Performed at [place] [address], [place], [place] [address]    Special Requests   Final    NONE Performed at [facility], [place] [address] [place], [place] [address]    Gram Stain   Final    NO SQUAMOUS EPITHELIAL CELLS SEEN FEW WBC SEEN NO ORGANISMS SEEN    Culture   Final    NO GROWTH 2 DAYS Performed at [facility], [place] [address], [place],  [address]    Report Status PENDING  Incomplete         Radiology Studies: DG Chest 1 View  Result Date: 10/30/2021 CLINICAL DATA:  Pleural effusion EXAM: CHEST  1 VIEW COMPARISON:  Chest x-ray dated October 28, 2021 FINDINGS: Visualized cardiac and mediastinal contours are unchanged. Moderate left pleural effusion, increased in size when compared to prior exam. Probable trace right pleural effusion. Mild bilateral interstitial opacities. No evidence of pneumothorax. IMPRESSION: 1. Moderate left pleural effusion, increased in size when compared to the prior exam. 2. Mild bilateral interstitial opacities, possibly due to pulmonary edema. Electronically Signed   By: Yetta Glassman M.D.   On: 10/30/2021 08:33   DG Chest 1 View  Result Date: 10/28/2021 CLINICAL DATA:  Post left thoracentesis. EXAM: CHEST  1 VIEW COMPARISON:  Chest x-ray 10/28/2021. FINDINGS: There is Julya Alioto small left pleural effusion which has decreased when compared to the prior study. There is no pneumothorax. Cardiomediastinal silhouette is within normal limits. There is some minimal opacities in the right mid lung and right lower lung which have mildly increased. Surgical clips overlie the right chest. No  acute fractures are seen. IMPRESSION: 1. Small left pleural effusion, decreased from  prior. 2. No pneumothorax. 3. Mild increase in right mid and lower lung airspace disease. Electronically Signed   By: Ronney Asters M.D.   On: 10/28/2021 15:44   MR ABDOMEN W WO CONTRAST  Result Date: 10/30/2021 CLINICAL DATA:  Abdominal pain, gastroesophageal varices, multifocal hepatocellular carcinoma status post left lobe microwave ablation EXAM: MRI ABDOMEN WITHOUT AND WITH CONTRAST TECHNIQUE: Multiplanar multisequence MR imaging of the abdomen was performed both before and after the administration of intravenous contrast. CONTRAST:  5.26mL GADAVIST GADOBUTROL 1 MMOL/ML IV SOLN COMPARISON:  MR abdomen, 07/17/2021, CT abdomen pelvis, 10/26/2021 FINDINGS: Lower chest: Large left, small right pleural effusions and associated atelectasis or consolidation. Hepatobiliary: Hepatic steatosis. Coarse, nodular, cirrhotic morphology of the liver. There is an ablation defect of the anterior left lobe of the liver, hepatic segment II, without residual contrast enhancement, which subtends both left lobe hyperenhancing lesions identified on prior MR (series 26, image 28). Significant interval increase in size of Vasiliy Mccarry hyperenhancing lesion of the posterior liver dome, hepatic segment VII, measuring 4.0 x 3.7 cm, previously 2.6 x 2.5 cm when measured similarly (series 21, image 30). This demonstrates some heterogeneous contrast enhancement although no clear evidence of washout or capsular enhancement. Slight interval increase in size of Sammy Douthitt hyperenhancing lesion of the inferior tip of the right lobe of the liver, hepatic segment VI, measuring 1.1 x 0.7 cm, previously no greater than 0.6 cm (series 21, image 56). There is Kamla Skilton lesion just superiorly in hepatic segment VI which is slightly increased in size, measuring 0.6 cm, previously no greater than 0.4 cm (series 21, image 50). No evidence of washout or capsular enhancement in the small lesions.  Layering sludge in the gallbladder with mild gallbladder wall thickening and pericholecystic fluid, similar to prior examination although nonspecific in the setting of ascites. No discrete gallstones. No biliary ductal dilatation. Pancreas: No mass, inflammatory changes, or other parenchymal abnormality identified. Spleen:  Within normal limits in size and appearance. Adrenals/Urinary Tract: No masses identified. No evidence of hydronephrosis. Stomach/Bowel: Visualized portions within the abdomen are unremarkable. Vascular/Lymphatic: No pathologically enlarged lymph nodes identified. No abdominal aortic aneurysm demonstrated. Portal veins are patent. Other:  Small volume ascites throughout the abdomen. Musculoskeletal: No suspicious bone lesions identified. IMPRESSION: 1. Ablation defect of the left lobe of the liver which subtends two lesions previously noted in hepatic segment II. No evidence of residual contrast enhancement to suggest viable tumor. 2. Significant interval increase in size of Alekhya Gravlin hyperenhancing lesion of the posterior liver dome, hepatic segment VII, measuring 4.0 x 3.7 cm, previously 2.6 x 2.5 cm when measured similarly. This demonstrates some heterogeneous contrast enhancement although no clear evidence of washout or capsular enhancement. Findings remain LI-RADS category 5, consistent with hepatocellular carcinoma. 3. Slight interval increase in size of Howell Groesbeck hyperenhancing lesion of the inferior tip of the right lobe of the liver, hepatic segment VI, measuring 1.1 x 0.7 cm, previously no greater than 0.6 cm, as well as an additional adjacent lesion measuring 0.6 cm, previously 0.4 cm. Given threshold growth, these lesions are upstaged to LI-RADS category 4 and suspicious for additional small foci of hepatocellular carcinoma. 4. Cirrhotic morphology of the liver and hepatic steatosis. 5. Small volume ascites throughout the abdomen. 6. Large left, small right pleural effusions and associated  atelectasis or consolidation. 7. Layering sludge in the gallbladder with mild gallbladder wall thickening and pericholecystic fluid, similar to prior examination although nonspecific in the setting of ascites. No discrete gallstones. Electronically Signed   By:  Delanna Ahmadi M.D.   On: 10/30/2021 08:28   MR 3D Recon At Scanner  Result Date: 10/30/2021 CLINICAL DATA:  Abdominal pain, gastroesophageal varices, multifocal hepatocellular carcinoma status post left lobe microwave ablation EXAM: MRI ABDOMEN WITHOUT AND WITH CONTRAST TECHNIQUE: Multiplanar multisequence MR imaging of the abdomen was performed both before and after the administration of intravenous contrast. CONTRAST:  5.5mL GADAVIST GADOBUTROL 1 MMOL/ML IV SOLN COMPARISON:  MR abdomen, 07/17/2021, CT abdomen pelvis, 10/26/2021 FINDINGS: Lower chest: Large left, small right pleural effusions and associated atelectasis or consolidation. Hepatobiliary: Hepatic steatosis. Coarse, nodular, cirrhotic morphology of the liver. There is an ablation defect of the anterior left lobe of the liver, hepatic segment II, without residual contrast enhancement, which subtends both left lobe hyperenhancing lesions identified on prior MR (series 26, image 28). Significant interval increase in size of Taiten Brawn hyperenhancing lesion of the posterior liver dome, hepatic segment VII, measuring 4.0 x 3.7 cm, previously 2.6 x 2.5 cm when measured similarly (series 21, image 30). This demonstrates some heterogeneous contrast enhancement although no clear evidence of washout or capsular enhancement. Slight interval increase in size of Joceline Hinchcliff hyperenhancing lesion of the inferior tip of the right lobe of the liver, hepatic segment VI, measuring 1.1 x 0.7 cm, previously no greater than 0.6 cm (series 21, image 56). There is Kylian Loh lesion just superiorly in hepatic segment VI which is slightly increased in size, measuring 0.6 cm, previously no greater than 0.4 cm (series 21, image 50). No evidence  of washout or capsular enhancement in the small lesions. Layering sludge in the gallbladder with mild gallbladder wall thickening and pericholecystic fluid, similar to prior examination although nonspecific in the setting of ascites. No discrete gallstones. No biliary ductal dilatation. Pancreas: No mass, inflammatory changes, or other parenchymal abnormality identified. Spleen:  Within normal limits in size and appearance. Adrenals/Urinary Tract: No masses identified. No evidence of hydronephrosis. Stomach/Bowel: Visualized portions within the abdomen are unremarkable. Vascular/Lymphatic: No pathologically enlarged lymph nodes identified. No abdominal aortic aneurysm demonstrated. Portal veins are patent. Other:  Small volume ascites throughout the abdomen. Musculoskeletal: No suspicious bone lesions identified. IMPRESSION: 1. Ablation defect of the left lobe of the liver which subtends two lesions previously noted in hepatic segment II. No evidence of residual contrast enhancement to suggest viable tumor. 2. Significant interval increase in size of Jadan Rouillard hyperenhancing lesion of the posterior liver dome, hepatic segment VII, measuring 4.0 x 3.7 cm, previously 2.6 x 2.5 cm when measured similarly. This demonstrates some heterogeneous contrast enhancement although no clear evidence of washout or capsular enhancement. Findings remain LI-RADS category 5, consistent with hepatocellular carcinoma. 3. Slight interval increase in size of Ab Leaming hyperenhancing lesion of the inferior tip of the right lobe of the liver, hepatic segment VI, measuring 1.1 x 0.7 cm, previously no greater than 0.6 cm, as well as an additional adjacent lesion measuring 0.6 cm, previously 0.4 cm. Given threshold growth, these lesions are upstaged to LI-RADS category 4 and suspicious for additional small foci of hepatocellular carcinoma. 4. Cirrhotic morphology of the liver and hepatic steatosis. 5. Small volume ascites throughout the abdomen. 6. Large  left, small right pleural effusions and associated atelectasis or consolidation. 7. Layering sludge in the gallbladder with mild gallbladder wall thickening and pericholecystic fluid, similar to prior examination although nonspecific in the setting of ascites. No discrete gallstones. Electronically Signed   By: Delanna Ahmadi M.D.   On: 10/30/2021 08:28   ECHOCARDIOGRAM COMPLETE  Result Date: 10/29/2021  ECHOCARDIOGRAM REPORT   Patient Name:   Theresa Rocha Date of Exam: 10/29/2021 Medical Rec #:  [id number]    Height:       63.0 in Accession #:    [id number]   Weight:       110.0 lb Date of Birth:  1966/10/23    BSA:          1.500 m Patient Age:    21 years     BP:           98/66 mmHg Patient Gender: F            HR:           91 bpm. Exam Location:  Inpatient Procedure: 2D Echo Indications:    pulmonary hypertension  History:        Patient has no prior history of Echocardiogram examinations.                 Cancer. Cirrhosis. Pleural effusion.  Sonographer:    [place] Referring Phys: [id number] [place]  1. Left ventricular ejection fraction, by estimation, is 60 to 65%. The left ventricle has normal function. The left ventricle has no regional wall motion abnormalities. Left ventricular diastolic parameters were normal.  2. Right ventricular systolic function is normal. The right ventricular size is normal. Tricuspid regurgitation signal is inadequate for assessing PA pressure.  3. Moderate pleural effusion in the left lateral region.  4. The mitral valve is normal in structure. No evidence of mitral valve regurgitation. No evidence of mitral stenosis.  5. The aortic valve is tricuspid. Aortic valve regurgitation is not visualized. No aortic stenosis is present.  6. The inferior vena cava is normal in size with greater than 50% respiratory variability, suggesting right atrial pressure of 3 mmHg. Comparison(s): No prior Echocardiogram. FINDINGS  Left Ventricle: Left ventricular  ejection fraction, by estimation, is 60 to 65%. The left ventricle has normal function. The left ventricle has no regional wall motion abnormalities. The left ventricular internal cavity size was normal in size. There is  no left ventricular hypertrophy. Left ventricular diastolic parameters were normal. Right Ventricle: The right ventricular size is normal. Right ventricular systolic function is normal. Tricuspid regurgitation signal is inadequate for assessing PA pressure. The tricuspid regurgitant velocity is 2.44 m/s, and with an assumed right atrial  pressure of 3 mmHg, the estimated right ventricular systolic pressure is 30.8 mmHg. Left [facility]: Left atrial size was normal in size. Right [facility]: Right atrial size was normal in size. Pericardium: Trivial pericardial effusion is present. Mitral Valve: The mitral valve is normal in structure. No evidence of mitral valve regurgitation. No evidence of mitral valve stenosis. Tricuspid Valve: The tricuspid valve is normal in structure. Tricuspid valve regurgitation is trivial. No evidence of tricuspid stenosis. Aortic Valve: The aortic valve is tricuspid. Aortic valve regurgitation is not visualized. No aortic stenosis is present. Pulmonic Valve: The pulmonic valve was not well visualized. Pulmonic valve regurgitation is not visualized. No evidence of pulmonic stenosis. Aorta: The aortic root is normal in size and structure. Venous: The inferior vena cava is normal in size with greater than 50% respiratory variability, suggesting right atrial pressure of 3 mmHg. IAS/Shunts: The interatrial septum was not well visualized. Additional Comments: There is Jun Osment moderate pleural effusion in the left lateral region.  LEFT VENTRICLE PLAX 2D LVIDd:         3.20 cm   Diastology LVIDs:  1.80 cm   LV e' medial:    8.05 cm/s LV PW:         0.70 cm   LV E/e' medial:  12.7 LV IVS:        0.60 cm   LV e' lateral:   7.40 cm/s LVOT diam:     1.50 cm   LV E/e' lateral: 13.8 LV SV:          33 LV SV Index:   22 LVOT Area:     1.77 cm  RIGHT VENTRICLE             IVC RV S prime:     13.90 cm/s  IVC diam: 1.30 cm LEFT ATRIUM             Index        RIGHT ATRIUM          Index LA diam:        2.50 cm 1.67 cm/m   RA Area:     8.04 cm LA Vol (A2C):   23.1 ml 15.40 ml/m  RA Volume:   14.30 ml 9.53 ml/m LA Vol (A4C):   23.0 ml 15.33 ml/m LA Biplane Vol: 23.7 ml 15.80 ml/m  AORTIC VALVE LVOT Vmax:   114.00 cm/s LVOT Vmean:  75.900 cm/s LVOT VTI:    0.186 m  AORTA Ao Root diam: 2.90 cm Ao Asc diam:  2.60 cm MITRAL VALVE                TRICUSPID VALVE MV Area (PHT): 4.41 cm     TR Peak grad:   23.8 mmHg MV Decel Time: 172 msec     TR Vmax:        244.00 cm/s MV E velocity: 102.00 cm/s MV Shiri Hodapp velocity: 64.90 cm/s   SHUNTS MV E/Myriah Boggus ratio:  1.57         Systemic VTI:  0.19 m                             Systemic Diam: 1.50 cm Kirk Ruths MD Electronically signed by Kirk Ruths MD Signature Date/Time: 10/29/2021/2:54:21 PM    Final    US Abdomen Limited RUQ (LIVER/GB)  Result Date: 10/29/2021 CLINICAL DATA:  Assess for portal vein thrombosis. EXAM: ULTRASOUND ABDOMEN LIMITED RIGHT UPPER QUADRANT COMPARISON:  Abdomen/pelvis CT 10/26/2021 FINDINGS: Gallbladder: Gallbladder wall is thickened and edematous measuring up to 8 mm. Sonographer reports no sonographic Murphy sign. No evidence for gallstones. Ascites/pericholecystic fluid evident. Common bile duct: Diameter: 6 mm Liver: Nodular liver contour with echogenic parenchyma. Posterior right hepatic lobe lesion measuring 3.7 cm was better characterized on recent CT. 2.0 cm heterogeneous lesion identified lateral segment left liver, also noted on prior CT. Portal vein is patent on color Doppler imaging with normal direction of blood flow towards the liver. Other: Right pleural effusion. IMPRESSION: 1. Main portal vein is patent. 2. Gallbladder wall thickening with pericholecystic fluid. This is Lawana Hartzell nonspecific finding in the setting of cirrhosis and  ascites. No gallstones are evident. 3. Cirrhosis with 2 hepatic lesions identified, better characterized on previous MRI. 4. Right pleural effusion. Electronically Signed   By: Misty Stanley M.D.   On: 10/29/2021 08:19        Scheduled Meds:  Chlorhexidine Gluconate Cloth  6 each Topical Daily   furosemide  40 mg Intravenous Q6H   mouth rinse  15 mL Mouth Rinse BID  midodrine  5 mg Oral TID WC   nicotine  21 mg Transdermal Daily   pantoprazole  40 mg Oral Daily   sucralfate  1 g Oral BID   tamoxifen  20 mg Oral Daily   Continuous Infusions:  albumin human 25 g (10/30/21 0933)   cefTRIAXone (ROCEPHIN)  IV Stopped (10/29/21 2125)   sodium chloride       LOS: 4 days    Time spent: over 30 min    Fayrene Helper, MD [facility]   To contact the attending provider between 7A-7P or the covering provider during after hours 7P-7A, please log into the web site [email or web address] and access using universal  password for that web site. If you do not have the password, please call the hospital operator.  10/30/2021, 3:10 PM

## 2021-10-30 NOTE — Assessment & Plan Note (Addendum)
Fever to 101.1, tachypnea, hypoxia Normal WBC count Due to COVID 19 infection, developed during admission here Will treat with dexamethasone given hypoxia, will avoid remdesivir given her cirrhosis  CXR 12/3 with stable to slight decrease in L effusion, diffuse interstitial prominence in R lung Follow blood cultures and covid/influenza testing Will cancel plan for paracentesis, now with source being covid Broadened to zosyn 12/2.  Prior to this had been on ceftriaxone for SBP ppx.  Will narrow back to ceftriaxone, plan for 7 days for SBP ppx while she's here.

## 2021-10-30 NOTE — Assessment & Plan Note (Addendum)
Start midodrine, albumin 2/2 cirrhosis Follow closely

## 2021-10-30 NOTE — Progress Notes (Signed)
Pharmacy Antibiotic Note  JESSICA CHECKETTS is a 55 y.o. female admitted on 10/26/2021 .  Pharmacy has been consulted for zosyn dosing for sepsis. Rocephin 11/29>> 12/1 Tm 101.1  Plan: Zosyn 3.375g IV q8h (4 hour infusion).  Height: 5\' 3"  (160 cm) Weight: 49.9 kg (110 lb 0.2 oz) IBW/kg (Calculated) : 52.4  Temp (24hrs), Avg:99.7 F (37.6 C), Min:97.7 F (36.5 C), Max:101.1 F (38.4 C)  Recent Labs  Lab 10/26/21 1757 10/27/21 0429 10/28/21 0846 10/28/21 1715 10/29/21 0102 10/30/21 0055  WBC 9.6 8.5  --  10.3 7.8 8.1  CREATININE 0.53 0.54 0.55  --  0.59 0.61    Estimated Creatinine Clearance: 63.3 mL/min (by C-G formula based on SCr of 0.61 mg/dL).    Allergies  Allergen Reactions   Hydromorphone Hcl Nausea And Vomiting    Severe vomiting   Aspirin Nausea Only   Thank you for allowing pharmacy to be a part of this patient's care.  Eudelia Bunch, Pharm.D 10/30/2021 7:48 PM

## 2021-10-30 NOTE — Plan of Care (Signed)
  Problem: Education: Goal: Knowledge of General Education information will improve Description: Including pain rating scale, medication(s)/side effects and non-pharmacologic comfort measures Outcome: Progressing   Problem: Health Behavior/Discharge Planning: Goal: Ability to manage health-related needs will improve Outcome: Progressing   Problem: Clinical Measurements: Goal: Ability to maintain clinical measurements within normal limits will improve Outcome: Progressing Goal: Will remain free from infection Outcome: Progressing Goal: Diagnostic test results will improve Outcome: Progressing Goal: Respiratory complications will improve Outcome: Progressing Goal: Cardiovascular complication will be avoided Outcome: Progressing   Problem: Nutrition: Goal: Adequate nutrition will be maintained Outcome: Progressing   Problem: Activity: Goal: Risk for activity intolerance will decrease Outcome: Progressing   Problem: Coping: Goal: Level of anxiety will decrease Outcome: Progressing   Problem: Elimination: Goal: Will not experience complications related to bowel motility Outcome: Progressing Goal: Will not experience complications related to urinary retention Outcome: Progressing   Problem: Pain Managment: Goal: General experience of comfort will improve Outcome: Progressing   Problem: Safety: Goal: Ability to remain free from injury will improve Outcome: Progressing   Problem: Skin Integrity: Goal: Risk for impaired skin integrity will decrease Outcome: Progressing   Problem: Education: Goal: Ability to identify signs and symptoms of gastrointestinal bleeding will improve Outcome: Progressing   Problem: Bowel/Gastric: Goal: Will show no signs and symptoms of gastrointestinal bleeding Outcome: Progressing   Problem: Fluid Volume: Goal: Will show no signs and symptoms of excessive bleeding Outcome: Progressing   Problem: Clinical Measurements: Goal:  Complications related to the disease process, condition or treatment will be avoided or minimized Outcome: Progressing

## 2021-10-30 NOTE — Progress Notes (Addendum)
[facility] Progress Note  CC:  Acute upper GI bleed, cirrhosis, hepatocellular carcinoma    Subjective: No N/V. She describes having pain to her lower esophagus while drinking any fluids or eating soft foods, feels like the liquid and solid foods go down to the stomach then backs up into the lower esophagus which results in pain to this area. No BM x 3 days but she is actively passing pas per the rectum. No CP. Oxygen sats dropped to 85%, oxygen was increased from 2 to 3L [place]. No SOB at this time.    Objective:  Vital signs in last 24 hours: Temp:  [97.7 F (36.5 C)-99.9 F (37.7 C)] 99.3 F (37.4 C) (12/02 0838) Pulse Rate:  [74-108] 86 (12/02 0517) Resp:  [16-26] 20 (12/02 0517) BP: (69-114)/(39-75) 96/55 (12/02 0517) SpO2:  [87 %-97 %] 91 % (12/02 0517) Last BM Date: 10/27/21 General:  Alert 55 year old female.  Heart: RRR, no murmur.  Pulm: Diminished breath sounds to the LLL otherwise clear. On oxygen 3L .  Abdomen: Mildly distended today, nontender. + BS x 4 quads.  Extremities:  No edema.  Neurologic:  Alert and  oriented x4;  grossly normal neurologically. Psych:  Alert and cooperative. Normal mood and affect.  Intake/Output from previous day: 12/01 0701 - 12/02 0700 In: 762.3 [I.V.:662.3; IV Piggyback:100] Out: 1750 [Urine:1750] Intake/Output this shift: No intake/output data recorded.  Lab Results: Recent Labs    10/28/21 1715 10/29/21 0102 10/29/21 0912 10/29/21 1716 10/30/21 0055  WBC 10.3 7.8  --   --  8.1  HGB 10.8* 9.7* 11.8* 10.4* 10.0*  10.0*  HCT 34.5* 30.9* 37.4 32.1* 31.7*  31.8*  PLT 71* 64*  --   --  76*   BMET Recent Labs    10/28/21 0846 10/29/21 0102 10/30/21 0055  NA 140 133* 132*  K 3.5 3.3* 4.1  CL 115* 105 102  CO2 20* 24 24  GLUCOSE 106* 110* 134*  BUN _0 CREATININE 0.55 0.59 0.61  CALCIUM 6.9* 6.8* 7.0*   LFT Recent Labs    10/30/21 0055  PROT 5.4*  ALBUMIN 2.5*  AST 33  ALT 16  ALKPHOS  58  BILITOT 1.2   PT/INR Recent Labs    10/29/21 0102 10/30/21 0055  LABPROT 18.3* 18.2*  INR 1.5* 1.5*   Hepatitis Panel No results for input(s): HEPBSAG, HCVAB, HEPAIGM, HEPBIGM in the last 72 hours.  DG Chest 1 View  Result Date: 10/30/2021 CLINICAL DATA:  Pleural effusion EXAM: CHEST  1 VIEW COMPARISON:  Chest x-ray dated October 28, 2021 FINDINGS: Visualized cardiac and mediastinal contours are unchanged. Moderate left pleural effusion, increased in size when compared to prior exam. Probable trace right pleural effusion. Mild bilateral interstitial opacities. No evidence of pneumothorax. IMPRESSION: 1. Moderate left pleural effusion, increased in size when compared to the prior exam. 2. Mild bilateral interstitial opacities, possibly due to pulmonary edema. Electronically Signed   By: Yetta Glassman M.D.   On: 10/30/2021 08:33   DG Chest 1 View  Result Date: 10/28/2021 CLINICAL DATA:  Post left thoracentesis. EXAM: CHEST  1 VIEW COMPARISON:  Chest x-ray 10/28/2021. FINDINGS: There is a small left pleural effusion which has decreased when compared to the prior study. There is no pneumothorax. Cardiomediastinal silhouette is within normal limits. There is some minimal opacities in the right mid lung and right lower lung which have mildly increased. Surgical clips overlie the right chest. No acute  fractures are seen. IMPRESSION: 1. Small left pleural effusion, decreased from prior. 2. No pneumothorax. 3. Mild increase in right mid and lower lung airspace disease. Electronically Signed   By: Ronney Asters M.D.   On: 10/28/2021 15:44   MR ABDOMEN W WO CONTRAST  Result Date: 10/30/2021 CLINICAL DATA:  Abdominal pain, gastroesophageal varices, multifocal hepatocellular carcinoma status post left lobe microwave ablation EXAM: MRI ABDOMEN WITHOUT AND WITH CONTRAST TECHNIQUE: Multiplanar multisequence MR imaging of the abdomen was performed both before and after the administration of  intravenous contrast. CONTRAST:  5.103m GADAVIST GADOBUTROL 1 MMOL/ML IV SOLN COMPARISON:  MR abdomen, 07/17/2021, CT abdomen pelvis, 10/26/2021 FINDINGS: Lower chest: Large left, small right pleural effusions and associated atelectasis or consolidation. Hepatobiliary: Hepatic steatosis. Coarse, nodular, cirrhotic morphology of the liver. There is an ablation defect of the anterior left lobe of the liver, hepatic segment II, without residual contrast enhancement, which subtends both left lobe hyperenhancing lesions identified on prior MR (series 26, image 28). Significant interval increase in size of a hyperenhancing lesion of the posterior liver dome, hepatic segment VII, measuring 4.0 x 3.7 cm, previously 2.6 x 2.5 cm when measured similarly (series 21, image 30). This demonstrates some heterogeneous contrast enhancement although no clear evidence of washout or capsular enhancement. Slight interval increase in size of a hyperenhancing lesion of the inferior tip of the right lobe of the liver, hepatic segment VI, measuring 1.1 x 0.7 cm, previously no greater than 0.6 cm (series 21, image 56). There is a lesion just superiorly in hepatic segment VI which is slightly increased in size, measuring 0.6 cm, previously no greater than 0.4 cm (series 21, image 50). No evidence of washout or capsular enhancement in the small lesions. Layering sludge in the gallbladder with mild gallbladder wall thickening and pericholecystic fluid, similar to prior examination although nonspecific in the setting of ascites. No discrete gallstones. No biliary ductal dilatation. Pancreas: No mass, inflammatory changes, or other parenchymal abnormality identified. Spleen:  Within normal limits in size and appearance. Adrenals/Urinary Tract: No masses identified. No evidence of hydronephrosis. Stomach/Bowel: Visualized portions within the abdomen are unremarkable. Vascular/Lymphatic: No pathologically enlarged lymph nodes identified. No  abdominal aortic aneurysm demonstrated. Portal veins are patent. Other:  Small volume ascites throughout the abdomen. Musculoskeletal: No suspicious bone lesions identified. IMPRESSION: 1. Ablation defect of the left lobe of the liver which subtends two lesions previously noted in hepatic segment II. No evidence of residual contrast enhancement to suggest viable tumor. 2. Significant interval increase in size of a hyperenhancing lesion of the posterior liver dome, hepatic segment VII, measuring 4.0 x 3.7 cm, previously 2.6 x 2.5 cm when measured similarly. This demonstrates some heterogeneous contrast enhancement although no clear evidence of washout or capsular enhancement. Findings remain LI-RADS category 5, consistent with hepatocellular carcinoma. 3. Slight interval increase in size of a hyperenhancing lesion of the inferior tip of the right lobe of the liver, hepatic segment VI, measuring 1.1 x 0.7 cm, previously no greater than 0.6 cm, as well as an additional adjacent lesion measuring 0.6 cm, previously 0.4 cm. Given threshold growth, these lesions are upstaged to LI-RADS category 4 and suspicious for additional small foci of hepatocellular carcinoma. 4. Cirrhotic morphology of the liver and hepatic steatosis. 5. Small volume ascites throughout the abdomen. 6. Large left, small right pleural effusions and associated atelectasis or consolidation. 7. Layering sludge in the gallbladder with mild gallbladder wall thickening and pericholecystic fluid, similar to prior examination although nonspecific in the  setting of ascites. No discrete gallstones. Electronically Signed   By: Delanna Ahmadi M.D.   On: 10/30/2021 08:28   MR 3D Recon At Scanner  Result Date: 10/30/2021 CLINICAL DATA:  Abdominal pain, gastroesophageal varices, multifocal hepatocellular carcinoma status post left lobe microwave ablation EXAM: MRI ABDOMEN WITHOUT AND WITH CONTRAST TECHNIQUE: Multiplanar multisequence MR imaging of the abdomen was  performed both before and after the administration of intravenous contrast. CONTRAST:  5.51m GADAVIST GADOBUTROL 1 MMOL/ML IV SOLN COMPARISON:  MR abdomen, 07/17/2021, CT abdomen pelvis, 10/26/2021 FINDINGS: Lower chest: Large left, small right pleural effusions and associated atelectasis or consolidation. Hepatobiliary: Hepatic steatosis. Coarse, nodular, cirrhotic morphology of the liver. There is an ablation defect of the anterior left lobe of the liver, hepatic segment II, without residual contrast enhancement, which subtends both left lobe hyperenhancing lesions identified on prior MR (series 26, image 28). Significant interval increase in size of a hyperenhancing lesion of the posterior liver dome, hepatic segment VII, measuring 4.0 x 3.7 cm, previously 2.6 x 2.5 cm when measured similarly (series 21, image 30). This demonstrates some heterogeneous contrast enhancement although no clear evidence of washout or capsular enhancement. Slight interval increase in size of a hyperenhancing lesion of the inferior tip of the right lobe of the liver, hepatic segment VI, measuring 1.1 x 0.7 cm, previously no greater than 0.6 cm (series 21, image 56). There is a lesion just superiorly in hepatic segment VI which is slightly increased in size, measuring 0.6 cm, previously no greater than 0.4 cm (series 21, image 50). No evidence of washout or capsular enhancement in the small lesions. Layering sludge in the gallbladder with mild gallbladder wall thickening and pericholecystic fluid, similar to prior examination although nonspecific in the setting of ascites. No discrete gallstones. No biliary ductal dilatation. Pancreas: No mass, inflammatory changes, or other parenchymal abnormality identified. Spleen:  Within normal limits in size and appearance. Adrenals/Urinary Tract: No masses identified. No evidence of hydronephrosis. Stomach/Bowel: Visualized portions within the abdomen are unremarkable. Vascular/Lymphatic: No  pathologically enlarged lymph nodes identified. No abdominal aortic aneurysm demonstrated. Portal veins are patent. Other:  Small volume ascites throughout the abdomen. Musculoskeletal: No suspicious bone lesions identified. IMPRESSION: 1. Ablation defect of the left lobe of the liver which subtends two lesions previously noted in hepatic segment II. No evidence of residual contrast enhancement to suggest viable tumor. 2. Significant interval increase in size of a hyperenhancing lesion of the posterior liver dome, hepatic segment VII, measuring 4.0 x 3.7 cm, previously 2.6 x 2.5 cm when measured similarly. This demonstrates some heterogeneous contrast enhancement although no clear evidence of washout or capsular enhancement. Findings remain LI-RADS category 5, consistent with hepatocellular carcinoma. 3. Slight interval increase in size of a hyperenhancing lesion of the inferior tip of the right lobe of the liver, hepatic segment VI, measuring 1.1 x 0.7 cm, previously no greater than 0.6 cm, as well as an additional adjacent lesion measuring 0.6 cm, previously 0.4 cm. Given threshold growth, these lesions are upstaged to LI-RADS category 4 and suspicious for additional small foci of hepatocellular carcinoma. 4. Cirrhotic morphology of the liver and hepatic steatosis. 5. Small volume ascites throughout the abdomen. 6. Large left, small right pleural effusions and associated atelectasis or consolidation. 7. Layering sludge in the gallbladder with mild gallbladder wall thickening and pericholecystic fluid, similar to prior examination although nonspecific in the setting of ascites. No discrete gallstones. Electronically Signed   By: ADelanna AhmadiM.D.   On: 10/30/2021 08:28  ECHOCARDIOGRAM COMPLETE  Result Date: 10/29/2021    ECHOCARDIOGRAM REPORT   Patient Name:   RHEANNON CERNEY Date of Exam: 10/29/2021 Medical Rec #:  [id number]    Height:       63.0 in Accession #:    [id number]   Weight:       110.0 lb Date of  Birth:  March 27, 1966    BSA:          1.500 m Patient Age:    86 years     BP:           98/66 mmHg Patient Gender: F            HR:           91 bpm. Exam Location:  Inpatient Procedure: 2D Echo Indications:    pulmonary hypertension  History:        Patient has no prior history of Echocardiogram examinations.                 Cancer. Cirrhosis. Pleural effusion.  Sonographer:    [place] Referring Phys: [id number] [place]  1. Left ventricular ejection fraction, by estimation, is 60 to 65%. The left ventricle has normal function. The left ventricle has no regional wall motion abnormalities. Left ventricular diastolic parameters were normal.  2. Right ventricular systolic function is normal. The right ventricular size is normal. Tricuspid regurgitation signal is inadequate for assessing PA pressure.  3. Moderate pleural effusion in the left lateral region.  4. The mitral valve is normal in structure. No evidence of mitral valve regurgitation. No evidence of mitral stenosis.  5. The aortic valve is tricuspid. Aortic valve regurgitation is not visualized. No aortic stenosis is present.  6. The inferior vena cava is normal in size with greater than 50% respiratory variability, suggesting right atrial pressure of 3 mmHg. Comparison(s): No prior Echocardiogram. FINDINGS  Left Ventricle: Left ventricular ejection fraction, by estimation, is 60 to 65%. The left ventricle has normal function. The left ventricle has no regional wall motion abnormalities. The left ventricular internal cavity size was normal in size. There is  no left ventricular hypertrophy. Left ventricular diastolic parameters were normal. Right Ventricle: The right ventricular size is normal. Right ventricular systolic function is normal. Tricuspid regurgitation signal is inadequate for assessing PA pressure. The tricuspid regurgitant velocity is 2.44 m/s, and with an assumed right atrial  pressure of 3 mmHg, the estimated right  ventricular systolic pressure is 23.9 mmHg. Left Atrium: Left atrial size was normal in size. Right Atrium: Right atrial size was normal in size. Pericardium: Trivial pericardial effusion is present. Mitral Valve: The mitral valve is normal in structure. No evidence of mitral valve regurgitation. No evidence of mitral valve stenosis. Tricuspid Valve: The tricuspid valve is normal in structure. Tricuspid valve regurgitation is trivial. No evidence of tricuspid stenosis. Aortic Valve: The aortic valve is tricuspid. Aortic valve regurgitation is not visualized. No aortic stenosis is present. Pulmonic Valve: The pulmonic valve was not well visualized. Pulmonic valve regurgitation is not visualized. No evidence of pulmonic stenosis. Aorta: The aortic root is normal in size and structure. Venous: The inferior vena cava is normal in size with greater than 50% respiratory variability, suggesting right atrial pressure of 3 mmHg. IAS/Shunts: The interatrial septum was not well visualized. Additional Comments: There is a moderate pleural effusion in the left lateral region.  LEFT VENTRICLE PLAX 2D LVIDd:         3.20 cm  Diastology LVIDs:         1.80 cm   LV e' medial:    8.05 cm/s LV PW:         0.70 cm   LV E/e' medial:  12.7 LV IVS:        0.60 cm   LV e' lateral:   7.40 cm/s LVOT diam:     1.50 cm   LV E/e' lateral: 13.8 LV SV:         33 LV SV Index:   22 LVOT Area:     1.77 cm  RIGHT VENTRICLE             IVC RV S prime:     13.90 cm/s  IVC diam: 1.30 cm LEFT ATRIUM             Index        RIGHT ATRIUM          Index LA diam:        2.50 cm 1.67 cm/m   RA Area:     8.04 cm LA Vol (A2C):   23.1 ml 15.40 ml/m  RA Volume:   14.30 ml 9.53 ml/m LA Vol (A4C):   23.0 ml 15.33 ml/m LA Biplane Vol: 23.7 ml 15.80 ml/m  AORTIC VALVE LVOT Vmax:   114.00 cm/s LVOT Vmean:  75.900 cm/s LVOT VTI:    0.186 m  AORTA Ao Root diam: 2.90 cm Ao Asc diam:  2.60 cm MITRAL VALVE                TRICUSPID VALVE MV Area (PHT): 4.41 cm      TR Peak grad:   23.8 mmHg MV Decel Time: 172 msec     TR Vmax:        244.00 cm/s MV E velocity: 102.00 cm/s MV A velocity: 64.90 cm/s   SHUNTS MV E/A ratio:  1.57         Systemic VTI:  0.19 m                             Systemic Diam: 1.50 cm Kirk Ruths MD Electronically signed by Kirk Ruths MD Signature Date/Time: 10/29/2021/2:54:21 PM    Final    US Abdomen Limited RUQ (LIVER/GB)  Result Date: 10/29/2021 CLINICAL DATA:  Assess for portal vein thrombosis. EXAM: ULTRASOUND ABDOMEN LIMITED RIGHT UPPER QUADRANT COMPARISON:  Abdomen/pelvis CT 10/26/2021 FINDINGS: Gallbladder: Gallbladder wall is thickened and edematous measuring up to 8 mm. Sonographer reports no sonographic Murphy sign. No evidence for gallstones. Ascites/pericholecystic fluid evident. Common bile duct: Diameter: 6 mm Liver: Nodular liver contour with echogenic parenchyma. Posterior right hepatic lobe lesion measuring 3.7 cm was better characterized on recent CT. 2.0 cm heterogeneous lesion identified lateral segment left liver, also noted on prior CT. Portal vein is patent on color Doppler imaging with normal direction of blood flow towards the liver. Other: Right pleural effusion. IMPRESSION: 1. Main portal vein is patent. 2. Gallbladder wall thickening with pericholecystic fluid. This is a nonspecific finding in the setting of cirrhosis and ascites. No gallstones are evident. 3. Cirrhosis with 2 hepatic lesions identified, better characterized on previous MRI. 4. Right pleural effusion. Electronically Signed   By: Misty Stanley M.D.   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 Admission Hg 8.3. Transfused 2 units of PRBCs -> Hg 9.1 -> 9.7. -> 10.8 -> Hg today 10. S/P EGD 11/29 identified 3 columns of grade 2 varices, 1 column with  stigmata of recent bleeding which was banded x 3. No significant portal gastropathy. Octreotide dc'd 12/1. No further hematemesis or melena. Stable Hg 10.  Alk phos 58. T. Bili 1.2. AST 33. ALT 16. Abdominal MRI 12/1 showed a significant interval increase in size of a hyperenhancing lesion of the posterior liver dome hepatic segment VII measuring 4.0 x 3.7 cm (previously 2.6 x 2.5 cm), a slight increase in the lesion to the inferior tip of the right lobe measuring 1.1 x 0.7cm and a slight increase to the lesion in segment VI measuring 0.6 cm concerning for HCC progression. She complains of pain to the lower esophagus after drinking fluids or eating soft foods. BP 96/55. Started on IV albumin.  -Continue Pantoprazole po QD -Add Carafate 1gm slurry po bid  -CBC in AM, BMP -Transfuse for Hg < 7 -Soft 2gm low sodium diet  -Repeat EGD with band ligation in 4 weeks -Oncology consult during this hospital admission vs follow up with oncologist [name] as an outpatient  -Further recommendations per Dr. Henrene Pastor   2) Recurrent left pleural effusion s/p thoracentesis 10/01/2021 (1.2L pleural fluid removed), 11/14 (1.8L pleural fluid removed), 10/26/2021 (700cc pleural fluid removed) and 11/30 (1.6L pleural fluid removed). 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 Pleural fluid is transudate with initial cytology showing atypical cells. No evidence of spontaneous bacterial pleuritis. Abd MRI 12/1 showed persistent large left pleural effusion and a small right pleural effusion. ECHO (done prior to consideration for possible TIPS if pleural effusions confirmed as hepatic hydrothorax) 12/1 showed LV EF 60 to 65% with normal RV function. Receiving Ceftriaxone 2gm IV QD and Albumin IV Q 6hrs.  -Lasix IV as tolerated, defer to CCM  3) Thrombocytopenia secondary to cirrhosis. PLT 64.    4) Enterocolitis per CTAP 10/26/2021. No abdominal pain or  diarrhea.    5) Coagulopathy secondary to cirrhosis. Transfused 2 units of FFP 10/27/2021. INR 1.8 -> 1.5.     LOS: 4 days   Noralyn Pick  10/30/2021, 10:43 AM  GI ATTENDING  Interval history data reviewed.  Agree with interval progress note as outlined above.  The patient is stable after band ligation for upper GI bleed secondary to esophageal varices.  Chest discomfort post ligation of varices is common and will dissipate over time.  Now off of octreotide.  Agree with advancing diet as tolerated (2 g sodium).  Pulmonary critical care addressing recurrent symptomatic pleural effusion and its treatment options.  Unfortunately, she appears to have progression of [place].  No specific or new recommendations at this time from a primary GI perspective.  We will arrange outpatient follow-up about 2 weeks after discharge.  We would consider repeat esophageal banding in about 4 weeks or so.  We will sign off at this time, but are available for questions or problems.  Docia Chuck. Geri Seminole., M.D. [facility] Division of Gastroenterology

## 2021-10-30 NOTE — Telephone Encounter (Signed)
Theresa Rocha, patient is currently at [facility] but hopefully will be discharged over the weekend.  Please contact the patient to schedule a follow-up appointment with me or any other app in 2 to 3 weeks.  Thank you

## 2021-10-30 NOTE — Progress Notes (Addendum)
NAME:  Theresa Rocha, MRN:  [id number], DOB:  05-29-1966, LOS: 4 ADMISSION DATE:  10/26/2021, CONSULTATION DATE:  10/26/21 REFERRING MD:  Rogene Houston CHIEF COMPLAINT:  hematemesis  History of Present Illness:  55 y/o F whop presented to [facility] ER on 11/28 with reports of hematemesis.  She has a known history of hepatitis C+, esophageal varices and hepatocellular carcinoma.  Prior to presentation, she had two episodes of hematemesis, frequent bouts of black tarry stools. In the ER she had two episodes of hematemesis.  Her baseline BP normally runs 194 systolic.  She had a left pleural effusion on presentation which was evacuated with thoracentesis (700 ml pleural fluid, no cytology/labs sent). She had a prior thoracentesis on 09/30/21 which was a transudate and positive for atypical cells but favored to be reactive mesothelial cells.  PCCM initially saw for critical care evaluation- BP improved with PRBC resuscitation.  She was stable and admitted per [facility].  On 11/30 CXR showed re accumulation of left pleural fluid.  PCCM called back 11/30 for evaluation of effusion.    Pertinent  Medical History  Reported hx of esophageal varices.  Pleural effusion Iron deficiency anemia Throbmocytopenia Right Breast cancer (on tamoxifen) Hepatocellular carcinoma Cirrhosis Chronic hep C  GERD ETOH hepatitis (?)  Significant Hospital Events: Including procedures, antibiotic start and stop dates in addition to other pertinent events   11/28 Admit  11/30 PCCM re-eval for left effusion, thora with 1.6L clear yellow fluid removed  12/1 No acute events over night, feels breathing is better after thora   Interim History / Subjective:  Afebrile  I/O 1.7L UOP, -964ml in last 24 hours  Pt reports return of cough, inability to come off oxygen even at rest  Denies pain, SOB.  Asking when she might be able to go home  Objective   Blood pressure (!) 96/55, pulse 86, temperature 97.7 F (36.5 C), temperature source Oral,  resp. rate 20, height 5\' 3"  (1.6 m), weight 49.9 kg, last menstrual period 04/20/2013, SpO2 91 %.        Intake/Output Summary (Last 24 hours) at 10/30/2021 1740 Last data filed at 10/29/2021 2040 Gross per 24 hour  Intake 762.31 ml  Output 1750 ml  Net -987.69 ml   Filed Weights   10/26/21 1643 10/27/21 1035  Weight: 49.9 kg 49.9 kg   Exam: General: adult female sitting up in bed in NAD  HEENT: MM pink/moist, anicteric, no jvd, [place] O2 Neuro: AAOx4, speech clear, MAE CV: s1s2 RRR, no m/r/g PULM: non-labored at rest, clear anterior, diminished posterior left  GI: soft, bsx4 active, tolerating PO's Extremities: warm/dry, no edema  Skin: no rashes or lesions   Resolved Hospital Problem list     Assessment & Plan:   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  Exacerbated by fluid overload in setting of UGIB rescuscitation.  Acute Hypoxic Respiratory Failure   -continue diuresis for net negative balance as renal function / BP permit  -RUQ [place] reviewed, no portal occlusion  -ECHO reviewed, within normal limits.  Likely RV would be able to tolerate TIPS  -follow up CXR in am.  If can maintain euvolemia & effusion reoccurs, would consider evaluation for TIPS.  However, given atypical cells in pleural fluid, need to rule out malignant involvement as do not believe TIPS would not be indicated in that situation.  If malignant, could consider pleurx placement but not for hepatic hydrothorax. Must also consider if she maintains euvolemia with  diuresis & no considerable fluid reaccumulation, conservative monitoring.   -patient prefers to not have pleurx placed if can avoid it.  Also, not sure this would be most favorable plan as no real endpoint given underlying physiology  -follow up repeat pleural cytology from 11/30  -UGIB management per GI -wean O2 for sats > 90%, anticipate she may need O2 at discharge.  Evaluate prior closer to discharge.   -pulmonary hygiene - IS, mobilize -PCCM will continue to follow       Noe Gens, MSN, APRN, NP-C, AGACNP-BC [facility] 10/30/2021, 7:28 AM   Please see [email or web address] for pager details.   From 7A-7P if no response, please call [phone] After hours, please call ELink [phone]

## 2021-10-30 NOTE — Progress Notes (Signed)
Pt BP has decreased to SBP<90 and MAP<60. PT remains asymptomatic and all other VS are WDL. Pt denies pain. Provider notified and aware. See orders. Will implement orders and notify MD if pt BP does not improve.   10/30/21 0300  Vitals  BP (!) 77/50  MAP (mmHg) (!) 58  BP Location Right Leg  BP Method Automatic  Patient Position (if appropriate) Lying  Pulse Rate 78  ECG Heart Rate 79  Resp 20  Level of Consciousness  Level of Consciousness Alert  Oxygen Therapy  SpO2 95 %  O2 Device Nasal Cannula  O2 Flow Rate (L/min) 3 L/min  Pre-WUA / WUA Start  [place] Agitation Sedation Scale (RASS) 0  RASS Goal 0  Pain Assessment  Pain Scale 0-10  Pain Score 0

## 2021-10-31 ENCOUNTER — Inpatient Hospital Stay (HOSPITAL_COMMUNITY): Payer: BC Managed Care – PPO

## 2021-10-31 DIAGNOSIS — U071 COVID-19: Secondary | ICD-10-CM

## 2021-10-31 DIAGNOSIS — I8511 Secondary esophageal varices with bleeding: Secondary | ICD-10-CM | POA: Diagnosis not present

## 2021-10-31 LAB — COMPREHENSIVE METABOLIC PANEL
ALT: 15 U/L (ref 0–44)
AST: 31 U/L (ref 15–41)
Albumin: 2.7 g/dL — ABNORMAL LOW (ref 3.5–5.0)
Alkaline Phosphatase: 49 U/L (ref 38–126)
Anion gap: 6 (ref 5–15)
BUN: 10 mg/dL (ref 6–20)
CO2: 25 mmol/L (ref 22–32)
Calcium: 7.2 mg/dL — ABNORMAL LOW (ref 8.9–10.3)
Chloride: 100 mmol/L (ref 98–111)
Creatinine, Ser: 0.59 mg/dL (ref 0.44–1.00)
GFR, Estimated: >60 mL/min (ref >60)
Glucose, Bld: 80 mg/dL (ref 70–99)
Potassium: 3.2 mmol/L — ABNORMAL LOW (ref 3.5–5.1)
Sodium: 131 mmol/L — ABNORMAL LOW (ref 135–145)
Total Bilirubin: 1.4 mg/dL — ABNORMAL HIGH (ref 0.3–1.2)
Total Protein: 5.2 g/dL — ABNORMAL LOW (ref 6.5–8.1)

## 2021-10-31 LAB — CBC WITH DIFFERENTIAL/PLATELET
Abs Immature Granulocytes: 0.04 10*3/uL (ref 0.00–0.07)
Basophils Absolute: 0 10*3/uL (ref 0.0–0.1)
Basophils Relative: 0 %
Eosinophils Absolute: 0.1 10*3/uL (ref 0.0–0.5)
Eosinophils Relative: 1 %
HCT: 28.5 % — ABNORMAL LOW (ref 36.0–46.0)
Hemoglobin: 8.8 g/dL — ABNORMAL LOW (ref 12.0–15.0)
Immature Granulocytes: 1 %
Lymphocytes Relative: 16 %
Lymphs Abs: 1.3 10*3/uL (ref 0.7–4.0)
MCH: 25.8 pg — ABNORMAL LOW (ref 26.0–34.0)
MCHC: 30.9 g/dL (ref 30.0–36.0)
MCV: 83.6 fL (ref 80.0–100.0)
Monocytes Absolute: 1 10*3/uL (ref 0.1–1.0)
Monocytes Relative: 12 %
Neutro Abs: 5.7 10*3/uL (ref 1.7–7.7)
Neutrophils Relative %: 70 %
Platelets: 70 10*3/uL — ABNORMAL LOW (ref 150–400)
RBC: 3.41 MIL/uL — ABNORMAL LOW (ref 3.87–5.11)
RDW: 22.4 % — ABNORMAL HIGH (ref 11.5–15.5)
WBC: 8.1 10*3/uL (ref 4.0–10.5)
nRBC: 0 % (ref 0.0–0.2)

## 2021-10-31 LAB — GLUCOSE, CAPILLARY
Glucose-Capillary: 109 mg/dL — ABNORMAL HIGH (ref 70–99)
Glucose-Capillary: 145 mg/dL — ABNORMAL HIGH (ref 70–99)
Glucose-Capillary: 180 mg/dL — ABNORMAL HIGH (ref 70–99)
Glucose-Capillary: 81 mg/dL (ref 70–99)

## 2021-10-31 LAB — PROTIME-INR
INR: 1.5 — ABNORMAL HIGH (ref 0.8–1.2)
Prothrombin Time: 18.3 seconds — ABNORMAL HIGH (ref 11.4–15.2)

## 2021-10-31 LAB — MAGNESIUM: Magnesium: 2 mg/dL (ref 1.7–2.4)

## 2021-10-31 LAB — PHOSPHORUS: Phosphorus: 2.2 mg/dL — ABNORMAL LOW (ref 2.5–4.6)

## 2021-10-31 LAB — AFP TUMOR MARKER: AFP, Serum, Tumor Marker: 5.1 ng/mL (ref 0.0–9.2)

## 2021-10-31 MED ORDER — FUROSEMIDE 10 MG/ML IJ SOLN
40.0000 mg | Freq: Once | INTRAMUSCULAR | Status: AC
  Administered 2021-10-31: 40 mg via INTRAVENOUS
  Filled 2021-10-31: qty 4

## 2021-10-31 MED ORDER — DEXAMETHASONE 4 MG PO TABS
6.0000 mg | ORAL_TABLET | Freq: Every day | ORAL | Status: DC
  Administered 2021-10-31 – 2021-11-02 (×3): 6 mg via ORAL
  Filled 2021-10-31 (×3): qty 1

## 2021-10-31 MED ORDER — MIDODRINE HCL 5 MG PO TABS
10.0000 mg | ORAL_TABLET | Freq: Three times a day (TID) | ORAL | Status: DC
  Administered 2021-10-31 – 2021-11-02 (×8): 10 mg via ORAL
  Filled 2021-10-31 (×7): qty 2

## 2021-10-31 MED ORDER — SODIUM CHLORIDE 0.9 % IV SOLN
2.0000 g | INTRAVENOUS | Status: AC
  Administered 2021-10-31 – 2021-11-01 (×2): 2 g via INTRAVENOUS
  Filled 2021-10-31 (×2): qty 20

## 2021-10-31 MED ORDER — POTASSIUM CHLORIDE CRYS ER 20 MEQ PO TBCR
40.0000 meq | EXTENDED_RELEASE_TABLET | ORAL | Status: AC
  Administered 2021-10-31 (×2): 40 meq via ORAL
  Filled 2021-10-31 (×2): qty 2

## 2021-10-31 MED ORDER — ALBUMIN HUMAN 25 % IV SOLN
12.5000 g | Freq: Once | INTRAVENOUS | Status: AC
  Administered 2021-10-31: 12.5 g via INTRAVENOUS
  Filled 2021-10-31: qty 50

## 2021-10-31 MED ORDER — FUROSEMIDE 10 MG/ML IJ SOLN
40.0000 mg | Freq: Every day | INTRAMUSCULAR | Status: DC
  Administered 2021-11-01 – 2021-11-02 (×2): 40 mg via INTRAVENOUS
  Filled 2021-10-31 (×2): qty 4

## 2021-10-31 NOTE — Progress Notes (Signed)
Pts temperature was 100.9 F, causing pt to be yellow MEWS. Tylenol administered. MD notified.

## 2021-10-31 NOTE — Progress Notes (Signed)
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  Now s/p EGD and banding for varices by GI. Hospitalization complicated by recurrent pleural effusions.  She notably had thoracentesis by IR prior to admission with 700 cc taken off L side.  She's now s/p second thoracentesis by PCCM on 11/30.  At this time attempting to diuresis to help resolve fluid.  Pulmonary is following, may need TIPS (vs other interventions).  Hospitalization complicated by COVID dx 29/7.  Now on dexamethasone.  Assessment & Plan:   Principal Problem:   Secondary esophageal varices with bleeding (HCC) Active Problems:   Hematemesis with nausea   SIRS (systemic inflammatory response syndrome) (HCC)   COVID-19 virus infection   Acute blood loss anemia   Hypotension   Cirrhosis (HCC)   Pleural effusion on left   Thrombocytopenia (HCC)   Malignant neoplasm of central portion of right breast in female, estrogen receptor positive (HCC)   Hepatocellular carcinoma (HCC)   Hypokalemia   Metabolic acidosis   Bowel wall thickening  * Secondary esophageal varices with bleeding ([place]) EGD 11/29 with recent upper GI bleed 2/2 esophageal varices s/p band ligation, incidental distal esophageal ring, mild pyloric inflammation On ceftriaxone, octreotide complete, PPI 40 mg PO daily, bid carafate slurry, will need follow up EGD with band ligation in 4 weeks.  Per GI, needs to follow up in about 2 weeks, repeat banding in 4 weeks.   COVID-19 virus infection dexamethasone  SIRS (systemic inflammatory response syndrome) (HCC) Fever to 101.1, tachypnea, hypoxia Normal WBC count Due to COVID 19 infection, developed  during admission here Will treat with dexamethasone given hypoxia, will avoid remdesivir given her cirrhosis  CXR 12/3 with stable to slight decrease in L effusion, diffuse interstitial prominence in R lung Follow blood cultures and covid/influenza testing Will cancel plan for paracentesis, now with source being covid Broadened to zosyn 12/2.  Prior to this had been on ceftriaxone for SBP ppx.  Will narrow back to ceftriaxone, plan for 7 days for SBP ppx while she's here.  Hypotension Start midodrine, albumin 2/2 cirrhosis Follow closely   Acute blood loss anemia S/p 2 units pRBC and 2 units FFP pm 11/28 and 11/29 respectively Hb stable at this time  Cirrhosis [facility]) thought to be due to ETOH and Hep C Followed [facility]   Pleural effusion on left Thoracentesis 11/28 by IR Post procedure CXR with trace apical PTX  Reportedly she states that her hepatologist suspects that the recurrent effusions have something to do the the ablation of the hepatocellular cancer Recurrent thoracentesis done 11/30 by pulm, appreciate assistance CXR 12/2 with moderate L effusion, mild bilateral interstitial opacities Labs reviewed from 11/2, suspected transudate Appreciate pulm assistance, follow repeat pleural fluid studies with cytology (reactive mesothelial cells) (looks c/w transudate) - may need to consider TIPS if rapidly reaccumulating  Continue lasix (with midodrine +/- albumin with hypotension) Echo with normal EF, RVSF normal, moderate L effusion) RUQ [place] with patent portal vein, gallbladder wall thickening, cirrhosis with 2 hepatic lesions, Right effusion  Thrombocytopenia ([place]) Chronic with cirrhosis  Malignant neoplasm of central portion of right breast in female, estrogen receptor positive (  [place]) Diagnosed in 2020 Status postlumpectomy and radiation  Metabolic acidosis resolved  Hypokalemia Replace, follwo  Hepatocellular carcinoma ([place]) Per GI notes, this is  progressing She is followed by [facility] and underwent an ablation of the dominant hepatic lesion in the left lobe about 1 month ago She has Theresa Rocha second 2.5 cm lesion in the right lobe of the liver with Theresa Rocha plan for ablation within the next month CT notably with neoplastic lesions within liver MRI with evidence of hepatocellular carcinoma, increasing in size Follows with [name] - consider discussion inpatient vs follow outpatient  Bowel wall thickening Noted on CT 11/28, mild thickening of walls of colon and distal small bowel  ? Related to cirrhosis vs enterocolitis  DVT prophylaxis: SCD Code Status: full Family Communication: none at bedside  Status is: Inpatient  Remains inpatient appropriate because: continued need for inpatient monitoring, treatment       Consultants:  GI PCCM  Procedures:  EGD impression 1. Recent upper GI bleed secondary to esophageal varices status post band ligation 2. Incidental distal esophageal ring 3. Mild pyloric inflammation 4. Otherwise normal EGD. Recommendation 1. Patient will return to the hospital ward for ongoing care and observation 2. Continue IV octreotide for now 3. Convert pantoprazole to 40 mg p.o. daily 4. Continue to monitor stools and hemoglobin 5. Clear liquid diet today 6. Will need follow-up EGD with band ligation in about 4 weeks  Thoracentesis 11/28 by IR Thoracentesis 11/30 by PCCM  Echo IMPRESSIONS     1. Left ventricular ejection fraction, by estimation, is 60 to 65%. The  left ventricle has normal function. The left ventricle has no regional  wall motion abnormalities. Left ventricular diastolic parameters were  normal.   2. Right ventricular systolic function is normal. The right ventricular  size is normal. Tricuspid regurgitation signal is inadequate for assessing  PA pressure.   3. Moderate pleural effusion in the left lateral region.   4. The mitral valve is normal in structure. No evidence of  mitral valve  regurgitation. No evidence of mitral stenosis.   5. The aortic valve is tricuspid. Aortic valve regurgitation is not  visualized. No aortic stenosis is present.   6. The inferior vena cava is normal in size with greater than 50%  respiratory variability, suggesting right atrial pressure of 3 mmHg.   Comparison(s): No prior Echocardiogram.   Antimicrobials:  Anti-infectives (From admission, onward)    Start     Dose/Rate Route Frequency Ordered Stop   10/31/21 2200  cefTRIAXone (ROCEPHIN) 2 g in sodium chloride 0.9 % 100 mL IVPB        2 g 200 mL/hr over 30 Minutes Intravenous Every 24 hours 10/31/21 1821 11/02/21 2159   10/30/21 2200  piperacillin-tazobactam (ZOSYN) IVPB 3.375 g  Status:  Discontinued        3.375 g 12.5 mL/hr over 240 Minutes Intravenous Every 8 hours 10/30/21 1934 10/31/21 1821   10/27/21 0003  sodium chloride 0.9 % with cefTRIAXone (ROCEPHIN) ADS Med       Note to Pharmacy: Theresa Rocha: cabinet override      10/27/21 0003 10/27/21 1214   10/26/21 2245  cefTRIAXone (ROCEPHIN) 2 g in sodium chloride 0.9 % 100 mL IVPB  Status:  Discontinued        2 g 200 mL/hr over 30 Minutes Intravenous Every 24 hours 10/26/21 2239 10/30/21 1926          Subjective: Feels Theresa Rocha little better today  Objective: Vitals:  10/31/21 0800 10/31/21 1113 10/31/21 1510 10/31/21 1700  BP: 90/64 97/61 103/74   Pulse: 79 82 78   Resp: 18 14    Temp: 98.4 F (36.9 C) 98.6 F (37 C) 99.9 F (37.7 C) 98.4 F (36.9 C)  TempSrc: Oral Oral Oral   SpO2: 96% 92% 91%   Weight:      Height:        Intake/Output Summary (Last 24 hours) at 10/31/2021 1822 Last data filed at 10/31/2021 1800 Gross per 24 hour  Intake 985 ml  Output 1300 ml  Net -315 ml   Filed Weights   10/26/21 1643 10/27/21 1035 10/30/21 2204  Weight: 49.9 kg 49.9 kg 49.1 kg    Examination:  General: No acute distress. Cardiovascular: RRR Lungs:  unlabored Abdomen: Soft, nontender,  nondistended  Neurological: Alert and oriented 3. Moves all extremities 4. Cranial nerves II through XII grossly intact. Skin: Warm and dry. No rashes or lesions. Extremities: No clubbing or cyanosis. No edema   Data Reviewed: I have personally reviewed following labs and imaging studies  CBC: Recent Labs  Lab 10/27/21 0429 10/27/21 1523 10/28/21 1715 10/29/21 0102 10/29/21 0912 10/29/21 1716 10/30/21 0055 10/31/21 0547  WBC 8.5  --  10.3 7.8  --   --  8.1 8.1  NEUTROABS  --   --   --  5.9  --   --  6.5 5.7  HGB 9.1*   < > 10.8* 9.7* 11.8* 10.4* 10.0*  10.0* 8.8*  HCT 29.3*   < > 34.5* 30.9* 37.4 32.1* 31.7*  31.8* 28.5*  MCV 81.6  --  82.1 80.9  --   --  82.1 83.6  PLT 60*  --  71* 64*  --   --  76* 70*   < > = values in this interval not displayed.    Basic Metabolic Panel: Recent Labs  Lab 10/27/21 0429 10/28/21 0846 10/29/21 0102 10/30/21 0055 10/31/21 0547  NA 139 140 133* 132* 131*  K 4.3 3.5 3.3* 4.1 3.2*  CL 116* 115* 105 102 100  CO2 20* 20* 24 24 25   GLUCOSE 119* 106* 110* 134* 80  BUN 14 12 7 8 10   CREATININE 0.54 0.55 0.59 0.61 0.59  CALCIUM 6.3* 6.9* 6.8* 7.0* 7.2*  MG  --  1.8 2.1 1.9 2.0  PHOS  --   --  2.1* 1.8* 2.2*    GFR: Estimated Creatinine Clearance: 62.3 mL/min (by C-G formula based on SCr of 0.59 mg/dL).  Liver Function Tests: Recent Labs  Lab 10/27/21 0429 10/28/21 0846 10/29/21 0102 10/30/21 0055 10/31/21 0547  AST 31 37 34 33 31  ALT 17 17 18 16 15   ALKPHOS 45 51 56 58 49  BILITOT 1.7* 0.9 1.1 1.2 1.4*  PROT 4.7* 5.4* 5.2* 5.4* 5.2*  ALBUMIN 2.0* 2.5* 2.4* 2.5* 2.7*    CBG: Recent Labs  Lab 10/30/21 1212 10/30/21 1543 10/31/21 0017 10/31/21 0809 10/31/21 1803  GLUCAP 151* 97 109* 81 180*     Recent Results (from the past 240 hour(s))  Resp Panel by RT-PCR (Flu Theresa Rocha&B, Covid) Nasopharyngeal Swab     Status: None   Collection Time: 10/26/21  5:57 PM   Specimen: Nasopharyngeal Swab; Nasopharyngeal(NP) swabs in  vial transport medium  Result Value Ref Range Status   SARS Coronavirus 2 by RT PCR NEGATIVE NEGATIVE Final    Comment: (NOTE) SARS-CoV-2 target nucleic acids are NOT DETECTED.  The SARS-CoV-2 RNA is generally detectable in upper  respiratory specimens during the acute phase of infection. The lowest concentration of SARS-CoV-2 viral copies this assay can detect is 138 copies/mL. Theresa Rocha negative result does not preclude SARS-Cov-2 infection and should not be used as the sole basis for treatment or other patient management decisions. Theresa Rocha negative result may occur with  improper specimen collection/handling, submission of specimen other than nasopharyngeal swab, presence of viral mutation(s) within the areas targeted by this assay, and inadequate number of viral copies(<138 copies/mL). Theresa Rocha negative result must be combined with clinical observations, patient history, and epidemiological information. The expected result is Negative.  Fact Sheet for Patients:  EntrepreneurPulse.com.au  Fact Sheet for Healthcare Providers:  IncredibleEmployment.be  This test is no t yet approved or cleared by the [place] FDA and  has been authorized for detection and/or diagnosis of SARS-CoV-2 by FDA under an Emergency Use Authorization (EUA). This EUA will remain  in effect (meaning this test can be used) for the duration of the COVID-19 declaration under Section 564(b)(1) of the Act, 21 U.S.C.section 360bbb-3(b)(1), unless the authorization is terminated  or revoked sooner.       Influenza Keylor Rands by PCR NEGATIVE NEGATIVE Final   Influenza B by PCR NEGATIVE NEGATIVE Final    Comment: (NOTE) The Xpert Xpress SARS-CoV-2/FLU/RSV plus assay is intended as an aid in the diagnosis of influenza from Nasopharyngeal swab specimens and should not be used as Theresa Rocha sole basis for treatment. Nasal washings and aspirates are unacceptable for Xpert Xpress SARS-CoV-2/FLU/RSV testing.  Fact  Sheet for Patients: EntrepreneurPulse.com.au  Fact Sheet for Healthcare Providers: IncredibleEmployment.be  This test is not yet approved or cleared by the [place] FDA and has been authorized for detection and/or diagnosis of SARS-CoV-2 by FDA under an Emergency Use Authorization (EUA). This EUA will remain in effect (meaning this test can be used) for the duration of the COVID-19 declaration under Section 564(b)(1) of the Act, 21 U.S.C. section 360bbb-3(b)(1), unless the authorization is terminated or revoked.  Performed at [facility], [place] [address] [place], [place] [address]   MRSA Next Gen by PCR, Nasal     Status: None   Collection Time: 10/27/21  3:08 PM   Specimen: Nasal Mucosa; Nasal Swab  Result Value Ref Range Status   MRSA by PCR Next Gen NOT DETECTED NOT DETECTED Final    Comment: (NOTE) The GeneXpert MRSA Assay (FDA approved for NASAL specimens only), is one component of Theresa Rocha comprehensive MRSA colonization surveillance program. It is not intended to diagnose MRSA infection nor to guide or monitor treatment for MRSA infections. Test performance is not FDA approved in patients less than 32 years old. Performed at [facility], [place] [address] [place], [place] [address]   Body fluid culture w Gram Stain     Status: None (Preliminary result)   Collection Time: 10/28/21  2:58 PM   Specimen: Pleural Fluid  Result Value Ref Range Status   Specimen Description   Final    PLEURAL Performed at [place] [address] [place],  [address]    Special Requests   Final    NONE Performed at [facility], [place] [address] [place], [place] [address]    Gram Stain   Final    NO SQUAMOUS EPITHELIAL CELLS SEEN FEW WBC SEEN NO ORGANISMS SEEN    Culture   Final    NO GROWTH 3 DAYS Performed at [facility], [place] [address], [place], [place]  [address]    Report Status PENDING  Incomplete  Culture, blood (routine x 2)     Status: None (Preliminary result)   Collection Time: 10/30/21  3:37 PM   Specimen: BLOOD  Result Value Ref Range Status   Specimen Description   Final    BLOOD BLOOD RIGHT FOREARM Performed at [place] [address], [place], [place] [address]    Special Requests   Final    BOTTLES DRAWN AEROBIC ONLY Blood Culture results may not be optimal due to an inadequate volume of blood received in culture bottles Performed at [place] [address], [place], [place] [address]    Culture   Final    NO GROWTH < 12 HOURS Performed at [place] [address] [place], [place] [address]    Report Status PENDING  Incomplete  Culture, blood (routine x 2)     Status: None (Preliminary result)   Collection Time: 10/30/21  3:37 PM   Specimen: BLOOD  Result Value Ref Range Status   Specimen Description   Final    BLOOD BLOOD RIGHT FOREARM Performed at [place] [address], [place], [place] [address]    Special Requests   Final    BOTTLES DRAWN AEROBIC ONLY Blood Culture adequate volume Performed at [place] [address], [place], [place] [address]    Culture   Final    NO GROWTH < 12 HOURS Performed at [place] [address], [place],  [address]    Report Status PENDING  Incomplete  Resp Panel by RT-PCR (Flu [place] Peifer&B, Covid) Nasopharyngeal Swab     Status: Abnormal   Collection Time: 10/30/21  3:45 PM   Specimen: Nasopharyngeal Swab; Nasopharyngeal(NP) swabs in vial transport medium  Result Value Ref Range Status   SARS Coronavirus 2 by RT PCR POSITIVE (Klaira Pesci) NEGATIVE Final    Comment: RESULT CALLED TO, READ BACK BY AND VERIFIED WITH: E.HAWKINS, RN AT 2028 ON 12.02.22 BY N.THOMPSON (NOTE) SARS-CoV-2 target nucleic acids are DETECTED.  The SARS-CoV-2 RNA is generally detectable in upper  respiratory specimens during the acute phase of infection. Positive results are indicative of the presence of the identified virus, but do not rule out bacterial infection or co-infection with other pathogens not detected by the test. Clinical correlation with patient history and other diagnostic information is necessary to determine patient infection status. The expected result is Negative.  Fact Sheet for Patients: EntrepreneurPulse.com.au  Fact Sheet for Healthcare Providers: IncredibleEmployment.be  This test is not yet approved or cleared by the [place] FDA and  has been authorized for detection and/or diagnosis of SARS-CoV-2 by FDA under an Emergency Use Authorization (EUA).  This EUA will remain in effect (meaning th is test can be used) for the duration of  the COVID-19 declaration under Section 564(b)(1) of the Act, 21 U.S.C. section 360bbb-3(b)(1), unless the authorization is terminated or revoked sooner.     Influenza Theresa Rocha by PCR NEGATIVE NEGATIVE Final   Influenza B by PCR NEGATIVE NEGATIVE Final    Comment: (NOTE) The Xpert Xpress SARS-CoV-2/FLU/RSV plus assay is intended as an aid in the diagnosis of influenza from Nasopharyngeal swab specimens and should not be used as Theresa Rocha sole basis for treatment. Nasal washings and aspirates are unacceptable for Xpert Xpress SARS-CoV-2/FLU/RSV testing.  Fact Sheet for Patients: EntrepreneurPulse.com.au  Fact Sheet for Healthcare Providers: IncredibleEmployment.be  This test is not yet approved or cleared  by the [place] and has been authorized for detection and/or diagnosis of SARS-CoV-2 by FDA under an Emergency Use Authorization (EUA). This EUA will remain in effect (meaning this test can be used) for the duration of the COVID-19 declaration under Section 564(b)(1) of the Act, 21 U.S.C. section 360bbb-3(b)(1), unless the authorization is  terminated or revoked.  Performed at [facility], [place] [address], [place], [place] [address]          Radiology Studies: DG Chest 1 View  Result Date: 10/30/2021 CLINICAL DATA:  Pleural effusion EXAM: CHEST  1 VIEW COMPARISON:  Chest x-ray dated October 28, 2021 FINDINGS: Visualized cardiac and mediastinal contours are unchanged. Moderate left pleural effusion, increased in size when compared to prior exam. Probable trace right pleural effusion. Mild bilateral interstitial opacities. No evidence of pneumothorax. IMPRESSION: 1. Moderate left pleural effusion, increased in size when compared to the prior exam. 2. Mild bilateral interstitial opacities, possibly due to pulmonary edema. Electronically Signed   By: Yetta Glassman M.D.   On: 10/30/2021 08:33   MR ABDOMEN W WO CONTRAST  Result Date: 10/30/2021 CLINICAL DATA:  Abdominal pain, gastroesophageal varices, multifocal hepatocellular carcinoma status post left lobe microwave ablation EXAM: MRI ABDOMEN WITHOUT AND WITH CONTRAST TECHNIQUE: Multiplanar multisequence MR imaging of the abdomen was performed both before and after the administration of intravenous contrast. CONTRAST:  5.71mL GADAVIST GADOBUTROL 1 MMOL/ML IV SOLN COMPARISON:  MR abdomen, 07/17/2021, CT abdomen pelvis, 10/26/2021 FINDINGS: Lower chest: Large left, small right pleural effusions and associated atelectasis or consolidation. Hepatobiliary: Hepatic steatosis. Coarse, nodular, cirrhotic morphology of the liver. There is an ablation defect of the anterior left lobe of the liver, hepatic segment II, without residual contrast enhancement, which subtends both left lobe hyperenhancing lesions identified on prior MR (series 26, image 28). Significant interval increase in size of Deanna Boehlke hyperenhancing lesion of the posterior liver dome, hepatic segment VII, measuring 4.0 x 3.7 cm, previously 2.6 x 2.5 cm when measured similarly (series 21, image 30). This demonstrates  some heterogeneous contrast enhancement although no clear evidence of washout or capsular enhancement. Slight interval increase in size of Theresa Rocha hyperenhancing lesion of the inferior tip of the right lobe of the liver, hepatic segment VI, measuring 1.1 x 0.7 cm, previously no greater than 0.6 cm (series 21, image 56). There is Theresa Rocha lesion just superiorly in hepatic segment VI which is slightly increased in size, measuring 0.6 cm, previously no greater than 0.4 cm (series 21, image 50). No evidence of washout or capsular enhancement in the small lesions. Layering sludge in the gallbladder with mild gallbladder wall thickening and pericholecystic fluid, similar to prior examination although nonspecific in the setting of ascites. No discrete gallstones. No biliary ductal dilatation. Pancreas: No mass, inflammatory changes, or other parenchymal abnormality identified. Spleen:  Within normal limits in size and appearance. Adrenals/Urinary Tract: No masses identified. No evidence of hydronephrosis. Stomach/Bowel: Visualized portions within the abdomen are unremarkable. Vascular/Lymphatic: No pathologically enlarged lymph nodes identified. No abdominal aortic aneurysm demonstrated. Portal veins are patent. Other:  Small volume ascites throughout the abdomen. Musculoskeletal: No suspicious bone lesions identified. IMPRESSION: 1. Ablation defect of the left lobe of the liver which subtends two lesions previously noted in hepatic segment II. No evidence of residual contrast enhancement to suggest viable tumor. 2. Significant interval increase in size of Theresa Rocha hyperenhancing lesion of the posterior liver dome, hepatic segment VII, measuring 4.0 x 3.7 cm, previously 2.6 x 2.5 cm when measured similarly.  This demonstrates some heterogeneous contrast enhancement although no clear evidence of washout or capsular enhancement. Findings remain LI-RADS category 5, consistent with hepatocellular carcinoma. 3. Slight interval increase in size of Theresa Rocha  hyperenhancing lesion of the inferior tip of the right lobe of the liver, hepatic segment VI, measuring 1.1 x 0.7 cm, previously no greater than 0.6 cm, as well as an additional adjacent lesion measuring 0.6 cm, previously 0.4 cm. Given threshold growth, these lesions are upstaged to LI-RADS category 4 and suspicious for additional small foci of hepatocellular carcinoma. 4. Cirrhotic morphology of the liver and hepatic steatosis. 5. Small volume ascites throughout the abdomen. 6. Large left, small right pleural effusions and associated atelectasis or consolidation. 7. Layering sludge in the gallbladder with mild gallbladder wall thickening and pericholecystic fluid, similar to prior examination although nonspecific in the setting of ascites. No discrete gallstones. Electronically Signed   By: Delanna Ahmadi M.D.   On: 10/30/2021 08:28   MR 3D Recon At Scanner  Result Date: 10/30/2021 CLINICAL DATA:  Abdominal pain, gastroesophageal varices, multifocal hepatocellular carcinoma status post left lobe microwave ablation EXAM: MRI ABDOMEN WITHOUT AND WITH CONTRAST TECHNIQUE: Multiplanar multisequence MR imaging of the abdomen was performed both before and after the administration of intravenous contrast. CONTRAST:  5.5mL GADAVIST GADOBUTROL 1 MMOL/ML IV SOLN COMPARISON:  MR abdomen, 07/17/2021, CT abdomen pelvis, 10/26/2021 FINDINGS: Lower chest: Large left, small right pleural effusions and associated atelectasis or consolidation. Hepatobiliary: Hepatic steatosis. Coarse, nodular, cirrhotic morphology of the liver. There is an ablation defect of the anterior left lobe of the liver, hepatic segment II, without residual contrast enhancement, which subtends both left lobe hyperenhancing lesions identified on prior MR (series 26, image 28). Significant interval increase in size of Dayami Taitt hyperenhancing lesion of the posterior liver dome, hepatic segment VII, measuring 4.0 x 3.7 cm, previously 2.6 x 2.5 cm when measured  similarly (series 21, image 30). This demonstrates some heterogeneous contrast enhancement although no clear evidence of washout or capsular enhancement. Slight interval increase in size of Theresa Rocha Schiavo hyperenhancing lesion of the inferior tip of the right lobe of the liver, hepatic segment VI, measuring 1.1 x 0.7 cm, previously no greater than 0.6 cm (series 21, image 56). There is Lovell Nuttall lesion just superiorly in hepatic segment VI which is slightly increased in size, measuring 0.6 cm, previously no greater than 0.4 cm (series 21, image 50). No evidence of washout or capsular enhancement in the small lesions. Layering sludge in the gallbladder with mild gallbladder wall thickening and pericholecystic fluid, similar to prior examination although nonspecific in the setting of ascites. No discrete gallstones. No biliary ductal dilatation. Pancreas: No mass, inflammatory changes, or other parenchymal abnormality identified. Spleen:  Within normal limits in size and appearance. Adrenals/Urinary Tract: No masses identified. No evidence of hydronephrosis. Stomach/Bowel: Visualized portions within the abdomen are unremarkable. Vascular/Lymphatic: No pathologically enlarged lymph nodes identified. No abdominal aortic aneurysm demonstrated. Portal veins are patent. Other:  Small volume ascites throughout the abdomen. Musculoskeletal: No suspicious bone lesions identified. IMPRESSION: 1. Ablation defect of the left lobe of the liver which subtends two lesions previously noted in hepatic segment II. No evidence of residual contrast enhancement to suggest viable tumor. 2. Significant interval increase in size of Marria Mathison hyperenhancing lesion of the posterior liver dome, hepatic segment VII, measuring 4.0 x 3.7 cm, previously 2.6 x 2.5 cm when measured similarly. This demonstrates some heterogeneous contrast enhancement although no clear evidence of washout or capsular enhancement. Findings remain LI-RADS category 5,  consistent with hepatocellular  carcinoma. 3. Slight interval increase in size of Nefertari Rebman hyperenhancing lesion of the inferior tip of the right lobe of the liver, hepatic segment VI, measuring 1.1 x 0.7 cm, previously no greater than 0.6 cm, as well as an additional adjacent lesion measuring 0.6 cm, previously 0.4 cm. Given threshold growth, these lesions are upstaged to LI-RADS category 4 and suspicious for additional small foci of hepatocellular carcinoma. 4. Cirrhotic morphology of the liver and hepatic steatosis. 5. Small volume ascites throughout the abdomen. 6. Large left, small right pleural effusions and associated atelectasis or consolidation. 7. Layering sludge in the gallbladder with mild gallbladder wall thickening and pericholecystic fluid, similar to prior examination although nonspecific in the setting of ascites. No discrete gallstones. Electronically Signed   By: Delanna Ahmadi M.D.   On: 10/30/2021 08:28   DG CHEST PORT 1 VIEW  Result Date: 10/31/2021 CLINICAL DATA:  Pleural effusion. EXAM: PORTABLE CHEST 1 VIEW COMPARISON:  10/30/2021 FINDINGS: 0606 hours. Moderate left pleural effusion is stable to slightly decreased in the interval. Heart size stable. Diffuse interstitial prominence in the right lung is similar to prior. Bones are diffusely demineralized. Telemetry leads overlie the chest. IMPRESSION: Stable to slight decrease in moderate left pleural effusion. Electronically Signed   By: Misty Stanley M.D.   On: 10/31/2021 08:15        Scheduled Meds:  dexamethasone  6 mg Oral Daily   [START ON 11/01/2021] furosemide  40 mg Intravenous Daily   mouth rinse  15 mL Mouth Rinse BID   midodrine  10 mg Oral TID WC   nicotine  21 mg Transdermal Daily   pantoprazole  40 mg Oral Daily   sucralfate  1 g Oral BID   tamoxifen  20 mg Oral Daily   Continuous Infusions:  albumin human     cefTRIAXone (ROCEPHIN)  IV     sodium chloride       LOS: 5 days    Time spent: over 30 min    Fayrene Helper, MD [facility]   To contact the attending provider between 7A-7P or the covering provider during after hours 7P-7A, please log into the web site [email or web address] and access using universal [place] password for that web site. If you do not have the password, please call the hospital operator.  10/31/2021, 6:22 PM

## 2021-10-31 NOTE — Assessment & Plan Note (Addendum)
dexamethasone 

## 2021-11-01 ENCOUNTER — Inpatient Hospital Stay (HOSPITAL_COMMUNITY): Payer: BC Managed Care – PPO

## 2021-11-01 DIAGNOSIS — I8511 Secondary esophageal varices with bleeding: Secondary | ICD-10-CM | POA: Diagnosis not present

## 2021-11-01 LAB — CBC WITH DIFFERENTIAL/PLATELET
Abs Immature Granulocytes: 0.03 10*3/uL (ref 0.00–0.07)
Basophils Absolute: 0 10*3/uL (ref 0.0–0.1)
Basophils Relative: 0 %
Eosinophils Absolute: 0 10*3/uL (ref 0.0–0.5)
Eosinophils Relative: 0 %
HCT: 29.2 % — ABNORMAL LOW (ref 36.0–46.0)
Hemoglobin: 9.2 g/dL — ABNORMAL LOW (ref 12.0–15.0)
Immature Granulocytes: 1 %
Lymphocytes Relative: 10 %
Lymphs Abs: 0.7 10*3/uL (ref 0.7–4.0)
MCH: 26.1 pg (ref 26.0–34.0)
MCHC: 31.5 g/dL (ref 30.0–36.0)
MCV: 82.7 fL (ref 80.0–100.0)
Monocytes Absolute: 0.5 10*3/uL (ref 0.1–1.0)
Monocytes Relative: 8 %
Neutro Abs: 5.3 10*3/uL (ref 1.7–7.7)
Neutrophils Relative %: 81 %
Platelets: 68 10*3/uL — ABNORMAL LOW (ref 150–400)
RBC: 3.53 MIL/uL — ABNORMAL LOW (ref 3.87–5.11)
RDW: 22.3 % — ABNORMAL HIGH (ref 11.5–15.5)
WBC: 6.5 10*3/uL (ref 4.0–10.5)
nRBC: 0 % (ref 0.0–0.2)

## 2021-11-01 LAB — COMPREHENSIVE METABOLIC PANEL
ALT: 15 U/L (ref 0–44)
AST: 29 U/L (ref 15–41)
Albumin: 2.8 g/dL — ABNORMAL LOW (ref 3.5–5.0)
Alkaline Phosphatase: 43 U/L (ref 38–126)
Anion gap: 3 — ABNORMAL LOW (ref 5–15)
BUN: 10 mg/dL (ref 6–20)
CO2: 26 mmol/L (ref 22–32)
Calcium: 7.7 mg/dL — ABNORMAL LOW (ref 8.9–10.3)
Chloride: 104 mmol/L (ref 98–111)
Creatinine, Ser: 0.6 mg/dL (ref 0.44–1.00)
GFR, Estimated: >60 mL/min (ref >60)
Glucose, Bld: 118 mg/dL — ABNORMAL HIGH (ref 70–99)
Potassium: 3.8 mmol/L (ref 3.5–5.1)
Sodium: 133 mmol/L — ABNORMAL LOW (ref 135–145)
Total Bilirubin: 1 mg/dL (ref 0.3–1.2)
Total Protein: 5.5 g/dL — ABNORMAL LOW (ref 6.5–8.1)

## 2021-11-01 LAB — BODY FLUID CULTURE W GRAM STAIN
Culture: NO GROWTH
Gram Stain: NONE SEEN

## 2021-11-01 LAB — URINE CULTURE: Culture: <10000 — AB

## 2021-11-01 LAB — C-REACTIVE PROTEIN: CRP: 7.1 mg/dL — ABNORMAL HIGH (ref <1.0)

## 2021-11-01 LAB — GLUCOSE, CAPILLARY
Glucose-Capillary: 118 mg/dL — ABNORMAL HIGH (ref 70–99)
Glucose-Capillary: 149 mg/dL — ABNORMAL HIGH (ref 70–99)

## 2021-11-01 LAB — PHOSPHORUS: Phosphorus: 2.3 mg/dL — ABNORMAL LOW (ref 2.5–4.6)

## 2021-11-01 LAB — MAGNESIUM: Magnesium: 1.9 mg/dL (ref 1.7–2.4)

## 2021-11-01 NOTE — Progress Notes (Addendum)
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  Underwent EGD and banding by of the varices by GI.  Her hospitalization was complicated by recurrent pleural effusion She had thoracentesis by IR prior to admission with 700 cc taken off on the left side and during this hospitalization she had thoracentesis by PCCM on November 30 at this time attempting to diuresis to help resolve fluid.  Pulmonary is following and may need TIPS versus other intervention Hospitalization was also complicated by [id number] on December 2.  She is now on dexamethasone.  Subjective Patient seen and examined at bedside she stated that her shortness of breath is improved husband is at bedside Assessment/Plan: Principal Problem:   Secondary esophageal varices with bleeding (HCC) Active Problems:   Cirrhosis ([place])   Hepatocellular carcinoma (HCC)   Malignant neoplasm of central portion of right breast in female, estrogen receptor positive ([place])   Thrombocytopenia ([place])   Hypokalemia   Acute blood loss anemia   Pleural effusion on left   Bowel wall thickening   Metabolic acidosis   Hematemesis with nausea   SIRS (systemic inflammatory response syndrome) (HCC)   Hypotension   COVID-19 virus infection   COVID-19 virus infection.  Patient is needing oxygen Continue dexamethasone   SIRS: Patient had a fever of 101 with tachycardia and hypoxia Her WBC where normal however This is due to COVID-19 infection which she contracted during hospital admission continue dexamethasone.  She was not given the remdesivir due to her cirrhosis  Secondary esophageal varices with bleeding She is status post EGD  November 29 with recent upper GI bleed on February 2022 with esophageal varices banding She is on ceftriaxone octreotide and PPI   Acute blood loss anemia Status post 2 unit packed RBC with 2 units of fresh frozen plasma on November 28 and November 29 respectively Hemoglobin is stable  Cirrhosis of the liver likely due to alcohol and hepatitis C Patient is being followed by [facility] Continue octreotide Continue pantoprazole Monitor stool and hemoglobin  Hepatocellular carcinoma Per GI this is progressing She is followed by [facility]    Recurrent pleural effusion Status post thoracentesis x2   Code Status: Full  Severity of Illness: The appropriate patient status for this patient is INPATIENT. Inpatient status is judged to be reasonable and necessary in order to provide the required intensity of service to ensure the patient's safety. The patient's presenting symptoms, physical exam findings, and initial radiographic and laboratory data in the context of their chronic comorbidities is felt to place them at high risk for further clinical deterioration. Furthermore, it is not anticipated that the patient will be medically stable for discharge from the hospital within 2 midnights of admission.  Requiring oxygen * I certify that at the point of admission it is my clinical judgment that the patient will require inpatient hospital care spanning beyond 2 midnights from the point of admission due to high intensity of service, high risk for further deterioration and high frequency of surveillance required.*   Family Communication: Husband to be at bedside  Disposition Plan: Home when stable Status is: Inpatient   Dispo: The patient is from: Home  Anticipated d/c is to:               Anticipated d/c date is:               Patient currently not medically stable for discharge  Consultants: GI PCCM IR  Procedures: EGD Thoracentesis October 26, 2021 by  IR Thoracentesis November 30 8 by PCCM  Antimicrobials: Ceftriaxone for 7 days for SBS prophylaxis  DVT prophylaxis: SCD   Objective: Vitals:   10/31/21 1510 10/31/21 1700 10/31/21 2054 11/01/21 0538  BP: 103/74 129/77 92/61 91/67   Pulse: 78 71 73 72  Resp:   18 18  Temp: 99.9 F (37.7 C) 98.4 F (36.9 C) 99.2 F (37.3 C) 98.1 F (36.7 C)  TempSrc: Oral Oral Oral Oral  SpO2: 91%  94% 92%  Weight:      Height:        Intake/Output Summary (Last 24 hours) at 11/01/2021 0922 Last data filed at 11/01/2021 0700 Gross per 24 hour  Intake 455.06 ml  Output 1150 ml  Net -694.94 ml   Filed Weights   10/26/21 1643 10/27/21 1035 10/30/21 2204  Weight: 49.9 kg 49.9 kg 49.1 kg   Body mass index is 19.17 kg/m.  Exam:  General: 55 y.o. year-old female well developed well nourished in no acute distress.  Alert and oriented x3.  No respiratory distress Cardiovascular: Regular rate and rhythm with no rubs or gallops.  No thyromegaly or JVD noted.   Respiratory: Clear to auscultation with no wheezes or rales. Good inspiratory effort. Abdomen: Soft nontender nondistended with normal bowel sounds x4 quadrants. Musculoskeletal: No lower extremity edema. 2/4 pulses in all 4 extremities. Skin: No ulcerative lesions noted or rashes, Psychiatry: Mood is appropriate for condition and setting Neurology:    Data Reviewed: CBC: Recent Labs  Lab 10/28/21 1715 10/29/21 0102 10/29/21 0912 10/29/21 1716 10/30/21 0055 10/31/21 0547 11/01/21 0508  WBC 10.3 7.8  --   --  8.1 8.1 6.5  NEUTROABS  --  5.9  --   --  6.5 5.7 5.3  HGB 10.8* 9.7* 11.8* 10.4* 10.0*  10.0* 8.8* 9.2*  HCT 34.5* 30.9* 37.4 32.1* 31.7*  31.8* 28.5* 29.2*  MCV 82.1 80.9  --   --  82.1 83.6 82.7  PLT 71* 64*  --   --  76* 70* 68*   Basic Metabolic Panel: Recent Labs  Lab 10/28/21 0846 10/29/21 0102 10/30/21 0055 10/31/21 0547 11/01/21 0508  NA 140 133* 132* 131* 133*  K 3.5 3.3* 4.1 3.2* 3.8  CL 115*  105 102 100 104  CO2 20* 24 24 25 26   GLUCOSE 106* 110* 134* 80 118*  BUN 12 7 8 10 10   CREATININE 0.55 0.59 0.61 0.59 0.60  CALCIUM 6.9* 6.8* 7.0* 7.2* 7.7*  MG 1.8 2.1 1.9 2.0 1.9  PHOS  --  2.1* 1.8* 2.2* 2.3*   GFR: Estimated Creatinine Clearance: 62.3 mL/min (by C-G formula based on SCr of 0.6 mg/dL). Liver Function Tests: Recent Labs  Lab 10/28/21 0846 10/29/21 0102 10/30/21 0055 10/31/21 0547 11/01/21 0508  AST 37 34 33 31 29  ALT 17 18 16 15 15   ALKPHOS 51 56 58 49 43  BILITOT 0.9 1.1 1.2 1.4* 1.0  PROT 5.4* 5.2* 5.4* 5.2* 5.5*  ALBUMIN 2.5* 2.4* 2.5* 2.7* 2.8*   Recent Labs  Lab 10/26/21 1757  LIPASE 38   No results for input(s): AMMONIA in the last 168 hours. Coagulation Profile: Recent Labs  Lab 10/27/21 0429 10/28/21 0244 10/29/21 0102 10/30/21 0055 10/31/21 0547  INR 1.8* 1.6* 1.5* 1.5* 1.5*   Cardiac Enzymes: No results for input(s): CKTOTAL, CKMB, CKMBINDEX, TROPONINI in the last 168 hours. BNP (last 3 results) No results for input(s): PROBNP in the last 8760 hours. HbA1C: No results for input(s): HGBA1C in the last 72 hours. CBG: Recent Labs  Lab 10/31/21 0017 10/31/21 0809 10/31/21 1803 10/31/21 2356 11/01/21 0817  GLUCAP 109* 81 180* 145* 118*   Lipid Profile: No results for input(s): CHOL, HDL, LDLCALC, TRIG, CHOLHDL, LDLDIRECT in the last 72 hours. Thyroid Function Tests: No results for input(s): TSH, T4TOTAL, FREET4, T3FREE, THYROIDAB in the last 72 hours. Anemia Panel: No results for input(s): VITAMINB12, FOLATE, FERRITIN, TIBC, IRON, RETICCTPCT in the last 72 hours. Urine analysis:    Component Value Date/Time   COLORURINE STRAW (A) 10/30/2021 2030   APPEARANCEUR CLEAR 10/30/2021 2030   LABSPEC 1.004 (L) 10/30/2021 2030   PHURINE 6.0 10/30/2021 2030   GLUCOSEU NEGATIVE 10/30/2021 2030   [place] NEGATIVE 10/30/2021 2030   [place] NEGATIVE 10/30/2021 2030   [place] 10/30/2021 2030   PROTEINUR NEGATIVE  10/30/2021 2030   NITRITE NEGATIVE 10/30/2021 2030   LEUKOCYTESUR NEGATIVE 10/30/2021 2030   Sepsis Labs: @LABRCNTIP (procalcitonin:4,lacticidven:4)  ) Recent Results (from the past 240 hour(s))  Resp Panel by RT-PCR (Flu A&B, Covid) Nasopharyngeal Swab     Status: None   Collection Time: 10/26/21  5:57 PM   Specimen: Nasopharyngeal Swab; Nasopharyngeal(NP) swabs in vial transport medium  Result Value Ref Range Status   SARS Coronavirus 2 by RT PCR NEGATIVE NEGATIVE Final    Comment: (NOTE) SARS-CoV-2 target nucleic acids are NOT DETECTED.  The SARS-CoV-2 RNA is generally detectable in upper respiratory specimens during the acute phase of infection. The lowest concentration of SARS-CoV-2 viral copies this assay can detect is 138 copies/mL. A negative result does not preclude SARS-Cov-2 infection and should not be used as the sole basis for treatment or other patient management decisions. A negative result may occur with  improper specimen collection/handling, submission of specimen other than nasopharyngeal swab, presence of viral mutation(s) within the areas targeted by this assay, and inadequate number of viral copies(<138 copies/mL). A negative result must be combined with clinical observations, patient history, and epidemiological information. The expected result is Negative.  Fact Sheet for Patients:  EntrepreneurPulse.com.au  Fact Sheet for Healthcare Providers:  IncredibleEmployment.be  This test is no t yet approved or cleared by the [place] FDA and  has been authorized for detection and/or diagnosis of SARS-CoV-2 by FDA under an Emergency Use Authorization (EUA). This EUA will remain  in effect (meaning this test can be used) for the duration of the COVID-19 declaration under Section 564(b)(1) of the Act, 21 U.S.C.section 360bbb-3(b)(1), unless the authorization is terminated  or revoked sooner.       Influenza A by PCR  NEGATIVE NEGATIVE Final   Influenza B by PCR NEGATIVE NEGATIVE Final    Comment: (NOTE) The Xpert Xpress SARS-CoV-2/FLU/RSV plus assay is intended as an aid in the diagnosis of influenza from Nasopharyngeal swab specimens and should not be used as a sole basis for treatment. Nasal washings and aspirates are unacceptable for Xpert Xpress SARS-CoV-2/FLU/RSV testing.  Fact Sheet for Patients: EntrepreneurPulse.com.au  Fact Sheet for Healthcare Providers: IncredibleEmployment.be  This test is not yet approved or cleared by the [place] FDA and has been authorized for detection and/or diagnosis of SARS-CoV-2 by FDA under an Emergency Use Authorization (  EUA). This EUA will remain in effect (meaning this test can be used) for the duration of the COVID-19 declaration under Section 564(b)(1) of the Act, 21 U.S.C. section 360bbb-3(b)(1), unless the authorization is terminated or revoked.  Performed at [facility], [place] [address], [place], [place] [address]   MRSA Next Gen by PCR, Nasal     Status: None   Collection Time: 10/27/21  3:08 PM   Specimen: Nasal Mucosa; Nasal Swab  Result Value Ref Range Status   MRSA by PCR Next Gen NOT DETECTED NOT DETECTED Final    Comment: (NOTE) The GeneXpert MRSA Assay (FDA approved for NASAL specimens only), is one component of a comprehensive MRSA colonization surveillance program. It is not intended to diagnose MRSA infection nor to guide or monitor treatment for MRSA infections. Test performance is not FDA approved in patients less than 64 years old. Performed at [facility], [place] [address], [place], [place] [address]   Body fluid culture w Gram Stain     Status: None (Preliminary result)   Collection Time: 10/28/21  2:58 PM   Specimen: Pleural Fluid  Result Value Ref Range Status   Specimen Description   Final    PLEURAL Performed at [place] [address], [place], [place] [address]    Special Requests   Final    NONE Performed at [facility], [place] [address], [place], [place] [address]    Gram Stain   Final    NO SQUAMOUS EPITHELIAL CELLS SEEN FEW WBC SEEN NO ORGANISMS SEEN    Culture   Final    NO GROWTH 3 DAYS Performed at [facility], [place] [address], [place], [place] [address]    Report Status PENDING  Incomplete  Culture, blood (routine x 2)     Status: None (Preliminary result)   Collection Time: 10/30/21  3:37 PM   Specimen: BLOOD  Result Value Ref Range Status   Specimen Description   Final    BLOOD BLOOD RIGHT FOREARM Performed at [place] [address] [place], [place] [address]    Special Requests   Final    BOTTLES DRAWN AEROBIC ONLY Blood Culture results may not be optimal due to an inadequate volume of blood received in culture bottles Performed at [place] [address] [place], [place] [address]    Culture   Final    NO GROWTH 2 DAYS Performed at [facility] [address], [place], [place] [address]    Report Status PENDING  Incomplete  Culture, blood (routine x 2)     Status: None (Preliminary result)   Collection Time: 10/30/21  3:37 PM   Specimen: BLOOD  Result Value Ref Range Status   Specimen Description   Final    BLOOD BLOOD RIGHT FOREARM Performed at [place] [address] [place], [place] [address]    Special Requests   Final    BOTTLES DRAWN AEROBIC ONLY Blood Culture adequate volume Performed at [place] [address] [place], [place] [address]    Culture   Final    NO GROWTH 2 DAYS Performed at [place] [address], [place], [place] [address]    Report Status PENDING  Incomplete  Resp Panel by RT-PCR (Flu A&B, Covid) Nasopharyngeal Swab     Status: Abnormal   Collection Time: 10/30/21  3:45 PM  Specimen:  Nasopharyngeal Swab; Nasopharyngeal(NP) swabs in vial transport medium  Result Value Ref Range Status   SARS Coronavirus 2 by RT PCR POSITIVE (A) NEGATIVE Final    Comment: RESULT CALLED TO, READ BACK BY AND VERIFIED WITH: E.HAWKINS, RN AT 2028 ON 12.02.22 BY N.THOMPSON (NOTE) SARS-CoV-2 target nucleic acids are DETECTED.  The SARS-CoV-2 RNA is generally detectable in upper respiratory specimens during the acute phase of infection. Positive results are indicative of the presence of the identified virus, but do not rule out bacterial infection or co-infection with other pathogens not detected by the test. Clinical correlation with patient history and other diagnostic information is necessary to determine patient infection status. The expected result is Negative.  Fact Sheet for Patients: EntrepreneurPulse.com.au  Fact Sheet for Healthcare Providers: IncredibleEmployment.be  This test is not yet approved or cleared by the [place] FDA and  has been authorized for detection and/or diagnosis of SARS-CoV-2 by FDA under an Emergency Use Authorization (EUA).  This EUA will remain in effect (meaning th is test can be used) for the duration of  the COVID-19 declaration under Section 564(b)(1) of the Act, 21 U.S.C. section 360bbb-3(b)(1), unless the authorization is terminated or revoked sooner.     Influenza A by PCR NEGATIVE NEGATIVE Final   Influenza B by PCR NEGATIVE NEGATIVE Final    Comment: (NOTE) The Xpert Xpress SARS-CoV-2/FLU/RSV plus assay is intended as an aid in the diagnosis of influenza from Nasopharyngeal swab specimens and should not be used as a sole basis for treatment. Nasal washings and aspirates are unacceptable for Xpert Xpress SARS-CoV-2/FLU/RSV testing.  Fact Sheet for Patients: EntrepreneurPulse.com.au  Fact Sheet for Healthcare Providers: IncredibleEmployment.be  This test is not  yet approved or cleared by the [place] FDA and has been authorized for detection and/or diagnosis of SARS-CoV-2 by FDA under an Emergency Use Authorization (EUA). This EUA will remain in effect (meaning this test can be used) for the duration of the COVID-19 declaration under Section 564(b)(1) of the Act, 21 U.S.C. section 360bbb-3(b)(1), unless the authorization is terminated or revoked.  Performed at [facility], [place] [address] [place], [place] [address]   Urine Culture     Status: Abnormal   Collection Time: 10/30/21  8:30 PM   Specimen: Urine, Clean Catch  Result Value Ref Range Status   Specimen Description   Final    URINE, CLEAN CATCH Performed at [facility], [place] [address], [place], [place] [address]    Special Requests   Final    NONE Performed at [facility], [place] [address], [place], [place] [address]    Culture (A)  Final    <10,000 COLONIES/mL INSIGNIFICANT GROWTH Performed at [place] [address] [place], [place] [address]    Report Status 11/01/2021 FINAL  Final      Studies: DG CHEST PORT 1 VIEW  Result Date: 11/01/2021 CLINICAL DATA:  Pleural effusion. EXAM: PORTABLE CHEST 1 VIEW COMPARISON:  10/31/2021 FINDINGS: 0604 hours. Similar moderate left pleural effusion. Right lung clear. The cardiopericardial silhouette is within normal limits for size. The visualized bony structures of the thorax show no acute abnormality. Telemetry leads overlie the chest. IMPRESSION: 1. Similar moderate left pleural effusion. 2. Interval decrease in diffuse interstitial opacity seen previously. Electronically Signed   By: Misty Stanley M.D.   On: 11/01/2021 08:21    Scheduled Meds:  dexamethasone  6 mg Oral Daily   furosemide  40 mg  Intravenous Daily   mouth rinse  15 mL Mouth Rinse BID   midodrine  10 mg Oral TID WC   nicotine  21 mg Transdermal Daily   pantoprazole  40 mg Oral Daily    sucralfate  1 g Oral BID   tamoxifen  20 mg Oral Daily    Continuous Infusions:  cefTRIAXone (ROCEPHIN)  IV 2 g (10/31/21 2250)   sodium chloride       LOS: 6 days     Cristal Deer, MD [facility]  To reach me or the doctor on call, go to: [email or web address] Password [email or web address]  11/01/2021, 9:22 AM

## 2021-11-02 ENCOUNTER — Telehealth: Payer: Self-pay | Admitting: Internal Medicine

## 2021-11-02 ENCOUNTER — Other Ambulatory Visit: Payer: Self-pay | Admitting: Internal Medicine

## 2021-11-02 ENCOUNTER — Telehealth: Payer: Self-pay | Admitting: Physician Assistant

## 2021-11-02 DIAGNOSIS — I8511 Secondary esophageal varices with bleeding: Secondary | ICD-10-CM | POA: Diagnosis not present

## 2021-11-02 LAB — GLUCOSE, CAPILLARY
Glucose-Capillary: 108 mg/dL — ABNORMAL HIGH (ref 70–99)
Glucose-Capillary: 161 mg/dL — ABNORMAL HIGH (ref 70–99)

## 2021-11-02 MED ORDER — PANTOPRAZOLE SODIUM 40 MG PO TBEC
40.0000 mg | DELAYED_RELEASE_TABLET | Freq: Every day | ORAL | 0 refills | Status: DC
Start: 2021-11-03 — End: 2021-11-04

## 2021-11-02 MED ORDER — FUROSEMIDE 40 MG PO TABS: 40.0000 mg | ORAL_TABLET | Freq: Every day | ORAL | 0 refills | Status: DC

## 2021-11-02 MED ORDER — GABAPENTIN 300 MG PO CAPS
300.0000 mg | ORAL_CAPSULE | Freq: Once | ORAL | Status: AC
  Administered 2021-11-02: 300 mg via ORAL
  Filled 2021-11-02: qty 1

## 2021-11-02 MED ORDER — MIDODRINE HCL 10 MG PO TABS: 10.0000 mg | ORAL_TABLET | Freq: Three times a day (TID) | ORAL | 0 refills | Status: AC

## 2021-11-02 MED ORDER — SUCRALFATE 1 GM/10ML PO SUSP: 1.0000 g | Freq: Two times a day (BID) | ORAL | 0 refills | Status: DC

## 2021-11-02 NOTE — Telephone Encounter (Signed)
Telephone call  11/03/2019 1:15 PM  Nursing staff called me from 4 E. at [facility] and told me the patient wanted to speak with someone in our clinic prior to being discharged today.  Called and spoke with the patient in her room.  She had a lot of questions including what she should eat going home.  Discussed a soft low-sodium diet going forward.  She tells me she still has some discomfort when swallowing.  Explained this can be somewhat normal after banding but that it should hopefully start to get some better.  She asked if her next EGD has been scheduled.  Explained that she has been scheduled for a follow-up appointment with Colleen in the clinic at that point they can schedule EGD with further banding as per recommendations.  Patient did discuss her disability paperwork, explained that as far as her pleural effusions etc. this would be per pulmonology.  Apparently they are coming to see her to before she leaves the hospital.  Encouraged patient to call us with any further questions after she is discharged.  Otherwise she will be seen in clinic at scheduled follow-up.  Ellouise Newer, PA-C

## 2021-11-02 NOTE — Telephone Encounter (Signed)
Noted  

## 2021-11-02 NOTE — Telephone Encounter (Signed)
Good Afternoon Dr. Henrene Pastor,  Patient called and stated that she is getting discharged today from the hospital and that she has some questions for you about the recovery after getting discharged.

## 2021-11-02 NOTE — Telephone Encounter (Signed)
She is still hospitalized. I put her in for an appointment 12/01/21 with you. We can change it if there is a change of plans.

## 2021-11-02 NOTE — Telephone Encounter (Signed)
Seen by GI PA in hospital.  Questions answered

## 2021-11-02 NOTE — Discharge Summary (Signed)
Physician Discharge Summary  Theresa Rocha VQM:[id number] DOB: 1966/01/28 DOA: 10/26/2021  PCP: Seward Carol, MD  Admit date: 10/26/2021 Discharge date: 11/02/2021  Admitted From: Home Disposition:  Home  Recommendations for Outpatient Follow-up:  Follow up with PCP in 1-2 weeks Please obtain BMP/CBC in 3-5 days  Follow-up outpatient gastroenterology and interventional radiology to discuss TIPS Follow-up outpatient [facility] for [facility] Lasix 40mg  po daily. Aldactone 50mg  po daily  Outpatient GI follow up to be arrnaged by their service. Need to discuss TIPS procedure.  Midodrine 10mg  PO TID with meals. Hold if SBP remains >130's. Discuss with PCP about any changes.  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  Underwent EGD requiring banding of several varices.  Hospitalization was also complicated by pleural effusion requiring thoracentesis by IR and PCCM.  Patient is on diuretics.  Hospital course complicated by [id number] on December 2 started on dexamethasone.  Patient's hypoxia resolved, required thoracentesis to be performed twice during hospitalization which showed transudative fluid.  Also received IV Lasix and Aldactone.  Patient will be discharged on p.o. Aldactone and Lasix.  GI signed off and on the day of discharge I spoke with GI team who stated they will discuss need for TIPS as an outpatient during her next visit.  At this time patient is medically stable for discharge, she is tolerating orals and ambulating without any shortness of breath.  She is saturating greater than 90% on room air.  Rest of the recommendations as stated above.     Assessment & Plan:   Principal Problem:   Secondary esophageal varices with bleeding  (HCC) Active Problems:   Cirrhosis (HCC)   Hepatocellular carcinoma (HCC)   Malignant neoplasm of central portion of right breast in female, estrogen receptor positive (HCC)   Thrombocytopenia (HCC)   Hypokalemia   Acute blood loss anemia   Pleural effusion on left   Bowel wall thickening   Metabolic acidosis   Hematemesis with nausea   SIRS (systemic inflammatory response syndrome) (HCC)   Hypotension   COVID-19 virus infection     COVID-19 infection Acute hypoxic respiratory distress - Patient is no longer hypoxic therefore we will stop dexamethasone.  Continue supportive care at home.   Hematemesis likely variceal bleeding Decompensated liver cirrhosis due to alcohol and hep C History of hepatocellular carcinoma - Status post EGD 11/29 requiring band ligation.  Was on on empiric IV Rocephin, PPI, Carafate.  Completed octreotide treatment - Will need repeat follow-up with outpatient GI in 4 weeks, GI team notified me they will discuss TIPS during that visit.  - Midodrine 10 mg 3 times daily, this can be discontinued outpatient once BP remains stable.  She will be discharged on Lasix and Aldactone. - [facility]  Recurrent pleural effusions - Status post thoracentesis by IR on 11/28 and pulmonary on 11/30 - Transition to p.o. Lasix and Aldactone.  I worry that this fluid will be recurrent therefore will require aggressive management as outpatient.  GI to consider TIPS as an outpatient - Echo shows normal EF  Acute blood loss anemia Thrombocytopenia secondary to cirrhosis - From acute blood loss.  Required 2 units PRBC.  Hemoglobin remained stable  Body mass index is 19.17 kg/m.  Discharge Diagnoses:  Principal Problem:   Secondary esophageal varices with bleeding (HCC) Active Problems:   Cirrhosis (HCC)   Hepatocellular carcinoma (HCC)   Malignant neoplasm of central portion of right breast in female, estrogen receptor positive (HCC)    Thrombocytopenia (HCC)   Hypokalemia   Acute blood loss anemia   Pleural effusion on left   Bowel wall thickening   Metabolic acidosis   Hematemesis with nausea   SIRS (systemic inflammatory response syndrome) (HCC)   Hypotension   COVID-19 virus infection      Consultations: Gastroenterology Pulmonary  Subjective: Feels great this morning, does not have any complaints at this time.  Discharge Exam: Vitals:   11/02/21 0537 11/02/21 1259  BP: 102/66 116/79  Pulse: 79 62  Resp: 15 14  Temp: 97.9 F (36.6 C) 98.2 F (36.8 C)  SpO2: 92% 95%   Vitals:   11/01/21 2113 11/01/21 2200 11/02/21 0537 11/02/21 1259  BP: 108/68  102/66 116/79  Pulse: 83  79 62  Resp: 20  15 14   Temp: 98 F (36.7 C)  97.9 F (36.6 C) 98.2 F (36.8 C)  TempSrc: Oral  Oral Oral  SpO2: 91% 95% 92% 95%  Weight:      Height:        General: Pt is alert, awake, not in acute distress Cardiovascular: RRR, S1/S2 +, no rubs, no gallops Respiratory: CTA bilaterally, no wheezing, no rhonchi Abdominal: Soft, NT, ND, bowel sounds + Extremities: no edema, no cyanosis  Discharge Instructions   Allergies as of 11/02/2021       Reactions   Hydromorphone Hcl Nausea And Vomiting   Severe vomiting   Aspirin Nausea Only        Medication List     TAKE these medications    dicyclomine 20 MG tablet Commonly known as: Bentyl Take one tablet every 4-6 hours as needed for abdominal cramping What changed:  how much to take how to take this when to take this reasons to take this additional instructions   furosemide 40 MG tablet Commonly known as: Lasix Take 1 tablet (40 mg total) by mouth daily.   ibuprofen 200 MG tablet Commonly known as: ADVIL Take 400 mg by mouth every 6 (six) hours as needed for mild pain.   loperamide 2 MG capsule Commonly known as: IMODIUM Take 4 mg by mouth every other day.   midodrine 10 MG tablet Commonly known as: PROAMATINE Take 1 tablet (10 mg total)  by mouth 3 (three) times daily with meals.   nicotine 21 mg/24hr patch Commonly known as: NICODERM CQ - dosed in mg/24 hours Place 21 mg onto the skin daily as needed (smoking cessation on work days).   OVER THE COUNTER MEDICATION Take 2 tablets by mouth every morning. Over the counter acid reducer   pantoprazole 40 MG tablet Commonly known as: PROTONIX Take 1 tablet (40 mg total) by mouth daily. Start taking on: November 03, 2021 What changed: additional instructions   promethazine 25 MG tablet Commonly known as: PHENERGAN Take 1 tablet (25 mg total) by mouth every 8 (eight) hours as needed for nausea or vomiting.   spironolactone 50 MG tablet Commonly known as: ALDACTONE Take 50 mg by mouth every morning.   sucralfate 1 GM/10ML suspension Commonly known as: CARAFATE Take 10 mLs (1 g total) by mouth 2 (two) times daily.   tamoxifen 20 MG tablet Commonly known as: NOLVADEX TAKE 1 TABLET BY MOUTH EVERY DAY What changed: when to take  this        Follow-up Information     Seward Carol, MD Follow up in 1 week(s).   Specialty: Internal Medicine Contact information: [address] [place] [address] [phone]                Allergies  Allergen Reactions   Hydromorphone Hcl Nausea And Vomiting    Severe vomiting   Aspirin Nausea Only    You were cared for by a hospitalist during your hospital stay. If you have any questions about your discharge medications or the care you received while you were in the hospital after you are discharged, you can call the unit and asked to speak with the hospitalist on call if the hospitalist that took care of you is not available. Once you are discharged, your primary care physician will handle any further medical issues. Please note that no refills for any discharge medications will be authorized once you are discharged, as it is imperative that you return to your primary care physician (or establish a  relationship with a primary care physician if you do not have one) for your aftercare needs so that they can reassess your need for medications and monitor your lab values.   Procedures/Studies: DG Chest 1 View  Result Date: 10/30/2021 CLINICAL DATA:  Pleural effusion EXAM: CHEST  1 VIEW COMPARISON:  Chest x-ray dated October 28, 2021 FINDINGS: Visualized cardiac and mediastinal contours are unchanged. Moderate left pleural effusion, increased in size when compared to prior exam. Probable trace right pleural effusion. Mild bilateral interstitial opacities. No evidence of pneumothorax. IMPRESSION: 1. Moderate left pleural effusion, increased in size when compared to the prior exam. 2. Mild bilateral interstitial opacities, possibly due to pulmonary edema. Electronically Signed   By: Yetta Glassman M.D.   On: 10/30/2021 08:33   DG Chest 1 View  Result Date: 10/28/2021 CLINICAL DATA:  Post left thoracentesis. EXAM: CHEST  1 VIEW COMPARISON:  Chest x-ray 10/28/2021. FINDINGS: There is a small left pleural effusion which has decreased when compared to the prior study. There is no pneumothorax. Cardiomediastinal silhouette is within normal limits. There is some minimal opacities in the right mid lung and right lower lung which have mildly increased. Surgical clips overlie the right chest. No acute fractures are seen. IMPRESSION: 1. Small left pleural effusion, decreased from prior. 2. No pneumothorax. 3. Mild increase in right mid and lower lung airspace disease. Electronically Signed   By: Ronney Asters M.D.   On: 10/28/2021 15:44   DG Chest 1 View  Result Date: 10/26/2021 CLINICAL DATA:  Pleural effusion, status post left thoracentesis. EXAM: CHEST  1 VIEW COMPARISON:  10/12/2021 and CT chest 09/30/2021. FINDINGS: Trachea is midline. Heart size normal. Small residual left pleural effusion, decreased from 10/12/2021. Trace left apical pneumothorax. Minimal left basilar atelectasis. Right lung is clear.  Surgical clips in right axilla. IMPRESSION: 1. Trace left apical pneumothorax status post left thoracentesis. Finding may not be clinically significant. These results were called by telephone at the time of interpretation on 10/26/2021 at 1:38 pm to provider [facility] , who verbally acknowledged these results. 2. Small residual left pleural effusion with left basilar subsegmental atelectasis. Electronically Signed   By: Lorin Picket M.D.   On: 10/26/2021 13:38   DG Chest 2 View  Result Date: 10/09/2021 CLINICAL DATA:  Pleural effusion EXAM: CHEST - 2 VIEW COMPARISON:  09/30/2021 FINDINGS: Normal heart size, mediastinal contours, and pulmonary vascularity. Moderate LEFT pleural  effusion significantly increased from prior exam. Associated compressive atelectasis of lower LEFT lung. Lungs otherwise clear. No infiltrate or pneumothorax. Osseous structures unremarkable. IMPRESSION: Moderate LEFT pleural effusion and basilar atelectasis significantly increased from prior exam. Electronically Signed   By: Lavonia Dana M.D.   On: 10/09/2021 11:56   MR ABDOMEN W WO CONTRAST  Result Date: 10/30/2021 CLINICAL DATA:  Abdominal pain, gastroesophageal varices, multifocal hepatocellular carcinoma status post left lobe microwave ablation EXAM: MRI ABDOMEN WITHOUT AND WITH CONTRAST TECHNIQUE: Multiplanar multisequence MR imaging of the abdomen was performed both before and after the administration of intravenous contrast. CONTRAST:  5.67mL GADAVIST GADOBUTROL 1 MMOL/ML IV SOLN COMPARISON:  MR abdomen, 07/17/2021, CT abdomen pelvis, 10/26/2021 FINDINGS: Lower chest: Large left, small right pleural effusions and associated atelectasis or consolidation. Hepatobiliary: Hepatic steatosis. Coarse, nodular, cirrhotic morphology of the liver. There is an ablation defect of the anterior left lobe of the liver, hepatic segment II, without residual contrast enhancement, which subtends both left lobe hyperenhancing lesions identified  on prior MR (series 26, image 28). Significant interval increase in size of a hyperenhancing lesion of the posterior liver dome, hepatic segment VII, measuring 4.0 x 3.7 cm, previously 2.6 x 2.5 cm when measured similarly (series 21, image 30). This demonstrates some heterogeneous contrast enhancement although no clear evidence of washout or capsular enhancement. Slight interval increase in size of a hyperenhancing lesion of the inferior tip of the right lobe of the liver, hepatic segment VI, measuring 1.1 x 0.7 cm, previously no greater than 0.6 cm (series 21, image 56). There is a lesion just superiorly in hepatic segment VI which is slightly increased in size, measuring 0.6 cm, previously no greater than 0.4 cm (series 21, image 50). No evidence of washout or capsular enhancement in the small lesions. Layering sludge in the gallbladder with mild gallbladder wall thickening and pericholecystic fluid, similar to prior examination although nonspecific in the setting of ascites. No discrete gallstones. No biliary ductal dilatation. Pancreas: No mass, inflammatory changes, or other parenchymal abnormality identified. Spleen:  Within normal limits in size and appearance. Adrenals/Urinary Tract: No masses identified. No evidence of hydronephrosis. Stomach/Bowel: Visualized portions within the abdomen are unremarkable. Vascular/Lymphatic: No pathologically enlarged lymph nodes identified. No abdominal aortic aneurysm demonstrated. Portal veins are patent. Other:  Small volume ascites throughout the abdomen. Musculoskeletal: No suspicious bone lesions identified. IMPRESSION: 1. Ablation defect of the left lobe of the liver which subtends two lesions previously noted in hepatic segment II. No evidence of residual contrast enhancement to suggest viable tumor. 2. Significant interval increase in size of a hyperenhancing lesion of the posterior liver dome, hepatic segment VII, measuring 4.0 x 3.7 cm, previously 2.6 x 2.5 cm  when measured similarly. This demonstrates some heterogeneous contrast enhancement although no clear evidence of washout or capsular enhancement. Findings remain LI-RADS category 5, consistent with hepatocellular carcinoma. 3. Slight interval increase in size of a hyperenhancing lesion of the inferior tip of the right lobe of the liver, hepatic segment VI, measuring 1.1 x 0.7 cm, previously no greater than 0.6 cm, as well as an additional adjacent lesion measuring 0.6 cm, previously 0.4 cm. Given threshold growth, these lesions are upstaged to LI-RADS category 4 and suspicious for additional small foci of hepatocellular carcinoma. 4. Cirrhotic morphology of the liver and hepatic steatosis. 5. Small volume ascites throughout the abdomen. 6. Large left, small right pleural effusions and associated atelectasis or consolidation. 7. Layering sludge in the gallbladder with mild gallbladder wall thickening and  pericholecystic fluid, similar to prior examination although nonspecific in the setting of ascites. No discrete gallstones. Electronically Signed   By: Delanna Ahmadi M.D.   On: 10/30/2021 08:28   DG Chest Left Decubitus  Result Date: 10/09/2021 CLINICAL DATA:  LEFT pleural effusion EXAM: CHEST - LEFT DECUBITUS COMPARISON:  PA and lateral exam same date FINDINGS: On LEFT lateral decubitus view, of the moderate LEFT pleural effusion is predominantly free-flowing. IMPRESSION: Free-flowing LEFT pleural effusion. Electronically Signed   By: Lavonia Dana M.D.   On: 10/09/2021 11:57   CT Abdomen Pelvis W Contrast  Result Date: 10/26/2021 CLINICAL DATA:  Acute abdominal pain. History of esophageal varices. Hemoptysis of bright red blood yesterday. Black tarry stools since yesterday. EXAM: CT ABDOMEN AND PELVIS WITH CONTRAST TECHNIQUE: Multidetector CT imaging of the abdomen and pelvis was performed using the standard protocol following bolus administration of intravenous contrast. CONTRAST:  82mL OMNIPAQUE IOHEXOL  350 MG/ML SOLN COMPARISON:  CT abdomen dated 07/04/2019 FINDINGS: Lower chest: LEFT pleural effusion, at least moderate in size, with adjacent atelectasis. RIGHT lung base is clear. Esophageal varices again identified within the lower esophagus, incompletely imaged. Hepatobiliary: Hypodense mass within the LEFT liver lobe, segment 2, measuring 4 cm, new compared to the CT abdomen of 07/04/2019 but most recently described on multiple abdominal MRIs, most recently described on MRI abdomen of 07/17/2021 as a known hepatocellular carcinoma. Additional hypodense lesion within the posterior RIGHT liver lobe, measuring 2.5 cm, with ring enhancement, compatible with another known neoplastic lesion in segment 6, likely enlarged compared to the most recent MRI abdomen of 07/17/2021. Additional smaller lesions scattered within the liver, also as previously described by MRI. Liver contours again appear nodular throughout, compatible with underlying cirrhosis. Pancreas: Unremarkable. No pancreatic ductal dilatation or surrounding inflammatory changes. Spleen: Normal in size without focal abnormality. Adrenals/Urinary Tract: Adrenal glands are unremarkable. Kidneys appear normal without mass, stone or hydronephrosis. Stomach/Bowel: No dilated large or small bowel loops. At least mild thickening of the walls of the entire colon. Additional probable thickening of the walls of the distal small bowel. More proximal small bowel is unremarkable. Stomach is unremarkable, partially decompressed. Vascular/Lymphatic: No acute-appearing vascular abnormality. Lower esophageal varices, as described above. Reproductive: Uterus and bilateral adnexa are unremarkable. Other: Trace ascites within the pelvis. No abscess collection. No free intraperitoneal air. Musculoskeletal: No acute-appearing osseous abnormality. IMPRESSION: 1. LEFT pleural effusion, at least moderate in size, with adjacent atelectasis. The pleural effusion is decreased compared  to recent chest CT angiogram of 09/30/2021, status post interval thoracentesis. 2. At least mild thickening of the walls of the entire colon and distal small bowel, suggestive of a diffuse enterocolitis of infectious or inflammatory nature. No bowel obstruction. 3. Prominent esophageal varices at the level of the lower esophagus, incompletely imaged. Can not exclude active hemorrhage within the lower esophagus, but no layering hemorrhage within the stomach to confirm active hemorrhage. 4. Cirrhotic liver. 5. Known neoplastic lesions within the liver, most recently described on MRI abdomen of 07/17/2021. 6. Trace ascites within the pelvis. No abscess collection. No free intraperitoneal air. Electronically Signed   By: Franki Cabot M.D.   On: 10/26/2021 20:33   MR 3D Recon At Scanner  Result Date: 10/30/2021 CLINICAL DATA:  Abdominal pain, gastroesophageal varices, multifocal hepatocellular carcinoma status post left lobe microwave ablation EXAM: MRI ABDOMEN WITHOUT AND WITH CONTRAST TECHNIQUE: Multiplanar multisequence MR imaging of the abdomen was performed both before and after the administration of intravenous contrast. CONTRAST:  5.55mL GADAVIST  GADOBUTROL 1 MMOL/ML IV SOLN COMPARISON:  MR abdomen, 07/17/2021, CT abdomen pelvis, 10/26/2021 FINDINGS: Lower chest: Large left, small right pleural effusions and associated atelectasis or consolidation. Hepatobiliary: Hepatic steatosis. Coarse, nodular, cirrhotic morphology of the liver. There is an ablation defect of the anterior left lobe of the liver, hepatic segment II, without residual contrast enhancement, which subtends both left lobe hyperenhancing lesions identified on prior MR (series 26, image 28). Significant interval increase in size of a hyperenhancing lesion of the posterior liver dome, hepatic segment VII, measuring 4.0 x 3.7 cm, previously 2.6 x 2.5 cm when measured similarly (series 21, image 30). This demonstrates some heterogeneous contrast  enhancement although no clear evidence of washout or capsular enhancement. Slight interval increase in size of a hyperenhancing lesion of the inferior tip of the right lobe of the liver, hepatic segment VI, measuring 1.1 x 0.7 cm, previously no greater than 0.6 cm (series 21, image 56). There is a lesion just superiorly in hepatic segment VI which is slightly increased in size, measuring 0.6 cm, previously no greater than 0.4 cm (series 21, image 50). No evidence of washout or capsular enhancement in the small lesions. Layering sludge in the gallbladder with mild gallbladder wall thickening and pericholecystic fluid, similar to prior examination although nonspecific in the setting of ascites. No discrete gallstones. No biliary ductal dilatation. Pancreas: No mass, inflammatory changes, or other parenchymal abnormality identified. Spleen:  Within normal limits in size and appearance. Adrenals/Urinary Tract: No masses identified. No evidence of hydronephrosis. Stomach/Bowel: Visualized portions within the abdomen are unremarkable. Vascular/Lymphatic: No pathologically enlarged lymph nodes identified. No abdominal aortic aneurysm demonstrated. Portal veins are patent. Other:  Small volume ascites throughout the abdomen. Musculoskeletal: No suspicious bone lesions identified. IMPRESSION: 1. Ablation defect of the left lobe of the liver which subtends two lesions previously noted in hepatic segment II. No evidence of residual contrast enhancement to suggest viable tumor. 2. Significant interval increase in size of a hyperenhancing lesion of the posterior liver dome, hepatic segment VII, measuring 4.0 x 3.7 cm, previously 2.6 x 2.5 cm when measured similarly. This demonstrates some heterogeneous contrast enhancement although no clear evidence of washout or capsular enhancement. Findings remain LI-RADS category 5, consistent with hepatocellular carcinoma. 3. Slight interval increase in size of a hyperenhancing lesion of  the inferior tip of the right lobe of the liver, hepatic segment VI, measuring 1.1 x 0.7 cm, previously no greater than 0.6 cm, as well as an additional adjacent lesion measuring 0.6 cm, previously 0.4 cm. Given threshold growth, these lesions are upstaged to LI-RADS category 4 and suspicious for additional small foci of hepatocellular carcinoma. 4. Cirrhotic morphology of the liver and hepatic steatosis. 5. Small volume ascites throughout the abdomen. 6. Large left, small right pleural effusions and associated atelectasis or consolidation. 7. Layering sludge in the gallbladder with mild gallbladder wall thickening and pericholecystic fluid, similar to prior examination although nonspecific in the setting of ascites. No discrete gallstones. Electronically Signed   By: Delanna Ahmadi M.D.   On: 10/30/2021 08:28   DG Chest 1V REPEAT Same Day  Result Date: 10/26/2021 CLINICAL DATA:  Post thoracentesis.  Evaluate for pneumothorax EXAM: CHEST - 1 VIEW SAME DAY COMPARISON:  IR ultrasound, earlier same day. Chest XR, 10/26/2021 and 10/09/2021. FINDINGS: Cardiomediastinal silhouette is within normal limits. Lungs are well inflated. Decreased volume of LEFT pleural effusion, as compared to 10/09/2021 chest XR. No enlarging pneumothorax. RIGHT chest and axillary surgical clips. No acute osseous abnormality. IMPRESSION:  No enlarging pneumothorax. Electronically Signed   By: Michaelle Birks M.D.   On: 10/26/2021 15:28   DG CHEST PORT 1 VIEW  Result Date: 11/01/2021 CLINICAL DATA:  Pleural effusion. EXAM: PORTABLE CHEST 1 VIEW COMPARISON:  10/31/2021 FINDINGS: 0604 hours. Similar moderate left pleural effusion. Right lung clear. The cardiopericardial silhouette is within normal limits for size. The visualized bony structures of the thorax show no acute abnormality. Telemetry leads overlie the chest. IMPRESSION: 1. Similar moderate left pleural effusion. 2. Interval decrease in diffuse interstitial opacity seen previously.  Electronically Signed   By: Misty Stanley M.D.   On: 11/01/2021 08:21   DG CHEST PORT 1 VIEW  Result Date: 10/31/2021 CLINICAL DATA:  Pleural effusion. EXAM: PORTABLE CHEST 1 VIEW COMPARISON:  10/30/2021 FINDINGS: 0606 hours. Moderate left pleural effusion is stable to slightly decreased in the interval. Heart size stable. Diffuse interstitial prominence in the right lung is similar to prior. Bones are diffusely demineralized. Telemetry leads overlie the chest. IMPRESSION: Stable to slight decrease in moderate left pleural effusion. Electronically Signed   By: Misty Stanley M.D.   On: 10/31/2021 08:15   DG CHEST PORT 1 VIEW  Result Date: 10/28/2021 CLINICAL DATA:  Cough and shortness of breath EXAM: PORTABLE CHEST 1 VIEW COMPARISON:  10/26/2021 FINDINGS: Significantly increased left pleural effusion and associated atelectasis. Suspect mild pulmonary edema. Increased heart size. IMPRESSION: Increased, now moderate left pleural effusion with associated atelectasis. Suspect mild pulmonary edema, noting increased heart size. Electronically Signed   By: Macy Mis M.D.   On: 10/28/2021 09:06   DG Chest Port 1 View  Result Date: 10/26/2021 CLINICAL DATA:  Vomiting, possible hematemesis. Possible blood in stool. Left thoracentesis today. EXAM: PORTABLE CHEST 1 VIEW COMPARISON:  10/26/2021 1:06 p.m. and 3:13 p.m. FINDINGS: Emphysema. Small left pleural effusion with a hazy density along the left hemidiaphragm, probably from layering effusion given the semi erect positioning although a component of re-expansion edema in this segment of the lung is not excluded. No visible pneumothorax. Heart size within normal limits. Right axillary and breast clips noted. IMPRESSION: 1. Small remaining left pleural effusion likely layering due to the semi erect positioning. There is some hazy density left lung base probably from layering effusion, less likely from re-expansion edema of this segment of lung. 2. No  pneumothorax. 3.  Emphysema (ICD10-J43.9). Electronically Signed   By: Van Clines M.D.   On: 10/26/2021 19:00   DG Chest Port 1 View  Result Date: 10/12/2021 CLINICAL DATA:  Status post left thoracentesis. EXAM: PORTABLE CHEST 1 VIEW COMPARISON:  10/09/2021 FINDINGS: The heart size and mediastinal contours are within normal limits. Significant reduction in left pleural effusion with small residual pleural effusion remaining with associated atelectasis at the left lung base. No pneumothorax. No pulmonary edema. The visualized skeletal structures are unremarkable. IMPRESSION: Reduction in left pleural fluid volume with small residual effusion. No pneumothorax after left thoracentesis. Electronically Signed   By: Aletta Edouard M.D.   On: 10/12/2021 17:14   ECHOCARDIOGRAM COMPLETE  Result Date: 10/29/2021    ECHOCARDIOGRAM REPORT   Patient Name:   Theresa Rocha Date of Exam: 10/29/2021 Medical Rec #:  [id number]    Height:       63.0 in Accession #:    [id number]   Weight:       110.0 lb Date of Birth:  10-29-1966    BSA:          1.500 m Patient Age:  55 years     BP:           98/66 mmHg Patient Gender: F            HR:           91 bpm. Exam Location:  Inpatient Procedure: 2D Echo Indications:    pulmonary hypertension  History:        Patient has no prior history of Echocardiogram examinations.                 Cancer. Cirrhosis. Pleural effusion.  Sonographer:    [place] Referring Phys: [id number] [place]  1. Left ventricular ejection fraction, by estimation, is 60 to 65%. The left ventricle has normal function. The left ventricle has no regional wall motion abnormalities. Left ventricular diastolic parameters were normal.  2. Right ventricular systolic function is normal. The right ventricular size is normal. Tricuspid regurgitation signal is inadequate for assessing PA pressure.  3. Moderate pleural effusion in the left lateral region.  4. The mitral valve is normal  in structure. No evidence of mitral valve regurgitation. No evidence of mitral stenosis.  5. The aortic valve is tricuspid. Aortic valve regurgitation is not visualized. No aortic stenosis is present.  6. The inferior vena cava is normal in size with greater than 50% respiratory variability, suggesting right atrial pressure of 3 mmHg. Comparison(s): No prior Echocardiogram. FINDINGS  Left Ventricle: Left ventricular ejection fraction, by estimation, is 60 to 65%. The left ventricle has normal function. The left ventricle has no regional wall motion abnormalities. The left ventricular internal cavity size was normal in size. There is  no left ventricular hypertrophy. Left ventricular diastolic parameters were normal. Right Ventricle: The right ventricular size is normal. Right ventricular systolic function is normal. Tricuspid regurgitation signal is inadequate for assessing PA pressure. The tricuspid regurgitant velocity is 2.44 m/s, and with an assumed right atrial  pressure of 3 mmHg, the estimated right ventricular systolic pressure is 43.1 mmHg. Left Atrium: Left atrial size was normal in size. Right Atrium: Right atrial size was normal in size. Pericardium: Trivial pericardial effusion is present. Mitral Valve: The mitral valve is normal in structure. No evidence of mitral valve regurgitation. No evidence of mitral valve stenosis. Tricuspid Valve: The tricuspid valve is normal in structure. Tricuspid valve regurgitation is trivial. No evidence of tricuspid stenosis. Aortic Valve: The aortic valve is tricuspid. Aortic valve regurgitation is not visualized. No aortic stenosis is present. Pulmonic Valve: The pulmonic valve was not well visualized. Pulmonic valve regurgitation is not visualized. No evidence of pulmonic stenosis. Aorta: The aortic root is normal in size and structure. Venous: The inferior vena cava is normal in size with greater than 50% respiratory variability, suggesting right atrial pressure of 3  mmHg. IAS/Shunts: The interatrial septum was not well visualized. Additional Comments: There is a moderate pleural effusion in the left lateral region.  LEFT VENTRICLE PLAX 2D LVIDd:         3.20 cm   Diastology LVIDs:         1.80 cm   LV e' medial:    8.05 cm/s LV PW:         0.70 cm   LV E/e' medial:  12.7 LV IVS:        0.60 cm   LV e' lateral:   7.40 cm/s LVOT diam:     1.50 cm   LV E/e' lateral: 13.8 LV SV:  33 LV SV Index:   22 LVOT Area:     1.77 cm  RIGHT VENTRICLE             IVC RV S prime:     13.90 cm/s  IVC diam: 1.30 cm LEFT ATRIUM             Index        RIGHT ATRIUM          Index LA diam:        2.50 cm 1.67 cm/m   RA Area:     8.04 cm LA Vol (A2C):   23.1 ml 15.40 ml/m  RA Volume:   14.30 ml 9.53 ml/m LA Vol (A4C):   23.0 ml 15.33 ml/m LA Biplane Vol: 23.7 ml 15.80 ml/m  AORTIC VALVE LVOT Vmax:   114.00 cm/s LVOT Vmean:  75.900 cm/s LVOT VTI:    0.186 m  AORTA Ao Root diam: 2.90 cm Ao Asc diam:  2.60 cm MITRAL VALVE                TRICUSPID VALVE MV Area (PHT): 4.41 cm     TR Peak grad:   23.8 mmHg MV Decel Time: 172 msec     TR Vmax:        244.00 cm/s MV E velocity: 102.00 cm/s MV A velocity: 64.90 cm/s   SHUNTS MV E/A ratio:  1.57         Systemic VTI:  0.19 m                             Systemic Diam: 1.50 cm Kirk Ruths MD Electronically signed by Kirk Ruths MD Signature Date/Time: 10/29/2021/2:54:21 PM    Final    IR Radiologist Eval & Mgmt  Result Date: 10/09/2021 Please refer to notes tab for details about interventional procedure. (Op Note)  US Abdomen Limited RUQ (LIVER/GB)  Result Date: 10/29/2021 CLINICAL DATA:  Assess for portal vein thrombosis. EXAM: ULTRASOUND ABDOMEN LIMITED RIGHT UPPER QUADRANT COMPARISON:  Abdomen/pelvis CT 10/26/2021 FINDINGS: Gallbladder: Gallbladder wall is thickened and edematous measuring up to 8 mm. Sonographer reports no sonographic Murphy sign. No evidence for gallstones. Ascites/pericholecystic fluid evident. Common bile  duct: Diameter: 6 mm Liver: Nodular liver contour with echogenic parenchyma. Posterior right hepatic lobe lesion measuring 3.7 cm was better characterized on recent CT. 2.0 cm heterogeneous lesion identified lateral segment left liver, also noted on prior CT. Portal vein is patent on color Doppler imaging with normal direction of blood flow towards the liver. Other: Right pleural effusion. IMPRESSION: 1. Main portal vein is patent. 2. Gallbladder wall thickening with pericholecystic fluid. This is a nonspecific finding in the setting of cirrhosis and ascites. No gallstones are evident. 3. Cirrhosis with 2 hepatic lesions identified, better characterized on previous MRI. 4. Right pleural effusion. Electronically Signed   By: Misty Stanley M.D.   On: 10/29/2021 08:19   IR THORACENTESIS ASP PLEURAL SPACE W/IMG GUIDE  Result Date: 10/26/2021 INDICATION: History of hepatocellular carcinoma, shortness of breast. Request for therapeutic thoracentesis. EXAM: ULTRASOUND GUIDED LEFT THORACENTESIS MEDICATIONS: 10 mL 1% lidocaine COMPLICATIONS: SIR Level A - No therapy, no consequence. PROCEDURE: An ultrasound guided thoracentesis was thoroughly discussed with the patient and questions answered. The benefits, risks, alternatives and complications were also discussed. The patient understands and wishes to proceed with the procedure. Written consent was obtained. Ultrasound was performed to localize and mark an adequate pocket of  fluid in the left chest. The area was then prepped and draped in the normal sterile fashion. 1% Lidocaine was used for local anesthesia. Under ultrasound guidance a 6 Fr Safe-T-Centesis catheter was introduced. Thoracentesis was performed. The catheter was removed and a dressing applied. FINDINGS: A total of approximately 700 cc of hazy yellow fluid was removed. Postprocedure chest x-ray reviewed, trace left apical pneumothorax seen. IMPRESSION: Successful ultrasound guided left thoracentesis  yielding 700 cc of pleural fluid. Procedure performed by: Durenda Guthrie, IR PA-C Electronically Signed   By: Michaelle Birks M.D.   On: 10/26/2021 15:25   IR THORACENTESIS ASP PLEURAL SPACE W/IMG GUIDE  Result Date: 10/12/2021 CLINICAL DATA:  Recurrent left pleural effusion. EXAM: ULTRASOUND GUIDED LEFT THORACENTESIS COMPARISON:  Prior thoracentesis on 09/30/2021 PROCEDURE: An ultrasound guided thoracentesis was thoroughly discussed with the patient and questions answered. The benefits, risks, alternatives and complications were also discussed. The patient understands and wishes to proceed with the procedure. Written consent was obtained. Ultrasound was performed to localize and mark an adequate pocket of fluid in the left chest. The area was then prepped and draped in the normal sterile fashion. 1% Lidocaine was used for local anesthesia. Under ultrasound guidance a 19 gauge Yueh catheter was introduced. Thoracentesis was performed. The catheter was removed and a dressing applied. COMPLICATIONS: None FINDINGS: A total of approximately 1.8 L of yellow fluid was removed. IMPRESSION: Successful ultrasound guided left thoracentesis yielding 1.8 L of pleural fluid. Electronically Signed   By: Aletta Edouard M.D.   On: 10/12/2021 17:12     The results of significant diagnostics from this hospitalization (including imaging, microbiology, ancillary and laboratory) are listed below for reference.     Microbiology: Recent Results (from the past 240 hour(s))  Resp Panel by RT-PCR (Flu A&B, Covid) Nasopharyngeal Swab     Status: None   Collection Time: 10/26/21  5:57 PM   Specimen: Nasopharyngeal Swab; Nasopharyngeal(NP) swabs in vial transport medium  Result Value Ref Range Status   SARS Coronavirus 2 by RT PCR NEGATIVE NEGATIVE Final    Comment: (NOTE) SARS-CoV-2 target nucleic acids are NOT DETECTED.  The SARS-CoV-2 RNA is generally detectable in upper respiratory specimens during the acute phase of  infection. The lowest concentration of SARS-CoV-2 viral copies this assay can detect is 138 copies/mL. A negative result does not preclude SARS-Cov-2 infection and should not be used as the sole basis for treatment or other patient management decisions. A negative result may occur with  improper specimen collection/handling, submission of specimen other than nasopharyngeal swab, presence of viral mutation(s) within the areas targeted by this assay, and inadequate number of viral copies(<138 copies/mL). A negative result must be combined with clinical observations, patient history, and epidemiological information. The expected result is Negative.  Fact Sheet for Patients:  EntrepreneurPulse.com.au  Fact Sheet for Healthcare Providers:  IncredibleEmployment.be  This test is no t yet approved or cleared by the [place] FDA and  has been authorized for detection and/or diagnosis of SARS-CoV-2 by FDA under an Emergency Use Authorization (EUA). This EUA will remain  in effect (meaning this test can be used) for the duration of the COVID-19 declaration under Section 564(b)(1) of the Act, 21 U.S.C.section 360bbb-3(b)(1), unless the authorization is terminated  or revoked sooner.       Influenza A by PCR NEGATIVE NEGATIVE Final   Influenza B by PCR NEGATIVE NEGATIVE Final    Comment: (NOTE) The Xpert Xpress SARS-CoV-2/FLU/RSV plus assay is intended as an aid in  the diagnosis of influenza from Nasopharyngeal swab specimens and should not be used as a sole basis for treatment. Nasal washings and aspirates are unacceptable for Xpert Xpress SARS-CoV-2/FLU/RSV testing.  Fact Sheet for Patients: EntrepreneurPulse.com.au  Fact Sheet for Healthcare Providers: IncredibleEmployment.be  This test is not yet approved or cleared by the [place] FDA and has been authorized for detection and/or diagnosis of SARS-CoV-2  by FDA under an Emergency Use Authorization (EUA). This EUA will remain in effect (meaning this test can be used) for the duration of the COVID-19 declaration under Section 564(b)(1) of the Act, 21 U.S.C. section 360bbb-3(b)(1), unless the authorization is terminated or revoked.  Performed at [facility], [place] [address], [place], [place] [address]   MRSA Next Gen by PCR, Nasal     Status: None   Collection Time: 10/27/21  3:08 PM   Specimen: Nasal Mucosa; Nasal Swab  Result Value Ref Range Status   MRSA by PCR Next Gen NOT DETECTED NOT DETECTED Final    Comment: (NOTE) The GeneXpert MRSA Assay (FDA approved for NASAL specimens only), is one component of a comprehensive MRSA colonization surveillance program. It is not intended to diagnose MRSA infection nor to guide or monitor treatment for MRSA infections. Test performance is not FDA approved in patients less than 14 years old. Performed at [facility], [place] [address], [place], [place] [address]   Body fluid culture w Gram Stain     Status: None   Collection Time: 10/28/21  2:58 PM   Specimen: Pleural Fluid  Result Value Ref Range Status   Specimen Description   Final    PLEURAL Performed at [place] [address], [place], [place] [address]    Special Requests   Final    NONE Performed at [facility], [place] [address] [place], [place] [address]    Gram Stain   Final    NO SQUAMOUS EPITHELIAL CELLS SEEN FEW WBC SEEN NO ORGANISMS SEEN    Culture   Final    NO GROWTH 3 DAYS Performed at [facility], [place] [address], [place], [place] [address]    Report Status 11/01/2021 FINAL  Final  Culture, blood (routine x 2)     Status: None (Preliminary result)   Collection Time: 10/30/21  3:37 PM   Specimen: BLOOD  Result Value Ref Range Status   Specimen Description   Final    BLOOD BLOOD RIGHT FOREARM Performed at [place] [address] [place], [place] [address]    Special Requests   Final    BOTTLES DRAWN AEROBIC ONLY Blood Culture results may not be optimal due to an inadequate volume of blood received in culture bottles Performed at [place] [address], [place], [place] [address]    Culture   Final    NO GROWTH 3 DAYS Performed at [facility], [place] [address] [place], [place] [address]    Report Status PENDING  Incomplete  Culture, blood (routine x 2)     Status: None (Preliminary result)   Collection Time: 10/30/21  3:37 PM   Specimen: BLOOD  Result Value Ref Range Status   Specimen Description   Final    BLOOD BLOOD RIGHT FOREARM Performed at [place] [address], [place], [place] [address]    Special Requests   Final    BOTTLES DRAWN AEROBIC ONLY Blood Culture adequate volume  Performed at [facility], [place] [address] [place], [place] [address]    Culture   Final    NO GROWTH 3 DAYS Performed at [facility], [place] [address], [place], [place] [address]    Report Status PENDING  Incomplete  Resp Panel by RT-PCR (Flu A&B, Covid) Nasopharyngeal Swab     Status: Abnormal   Collection Time: 10/30/21  3:45 PM   Specimen: Nasopharyngeal Swab; Nasopharyngeal(NP) swabs in vial transport medium  Result Value Ref Range Status   SARS Coronavirus 2 by RT PCR POSITIVE (A) NEGATIVE Final    Comment: RESULT CALLED TO, READ BACK BY AND VERIFIED WITH: E.HAWKINS, RN AT 2028 ON 12.02.22 BY N.THOMPSON (NOTE) SARS-CoV-2 target nucleic acids are DETECTED.  The SARS-CoV-2 RNA is generally detectable in upper respiratory specimens during the acute phase of infection. Positive results are indicative of the presence of the identified virus, but do not rule out bacterial infection or co-infection with other pathogens not detected by the test. Clinical correlation with patient history and other diagnostic information  is necessary to determine patient infection status. The expected result is Negative.  Fact Sheet for Patients: EntrepreneurPulse.com.au  Fact Sheet for Healthcare Providers: IncredibleEmployment.be  This test is not yet approved or cleared by the [place] FDA and  has been authorized for detection and/or diagnosis of SARS-CoV-2 by FDA under an Emergency Use Authorization (EUA).  This EUA will remain in effect (meaning th is test can be used) for the duration of  the COVID-19 declaration under Section 564(b)(1) of the Act, 21 U.S.C. section 360bbb-3(b)(1), unless the authorization is terminated or revoked sooner.     Influenza A by PCR NEGATIVE NEGATIVE Final   Influenza B by PCR NEGATIVE NEGATIVE Final    Comment: (NOTE) The Xpert Xpress SARS-CoV-2/FLU/RSV plus assay is intended as an aid in the diagnosis of influenza from Nasopharyngeal swab specimens and should not be used as a sole basis for treatment. Nasal washings and aspirates are unacceptable for Xpert Xpress SARS-CoV-2/FLU/RSV testing.  Fact Sheet for Patients: EntrepreneurPulse.com.au  Fact Sheet for Healthcare Providers: IncredibleEmployment.be  This test is not yet approved or cleared by the [place] FDA and has been authorized for detection and/or diagnosis of SARS-CoV-2 by FDA under an Emergency Use Authorization (EUA). This EUA will remain in effect (meaning this test can be used) for the duration of the COVID-19 declaration under Section 564(b)(1) of the Act, 21 U.S.C. section 360bbb-3(b)(1), unless the authorization is terminated or revoked.  Performed at [facility], [place] [address] [place], [place] [address]   Urine Culture     Status: Abnormal   Collection Time: 10/30/21  8:30 PM   Specimen: Urine, Clean Catch  Result Value Ref Range Status   Specimen Description   Final    URINE, CLEAN  CATCH Performed at [facility], [place] [address], [place], [place] [address]    Special Requests   Final    NONE Performed at [facility], [place] [address] [place], [place] [address]    Culture (A)  Final    <10,000 COLONIES/mL INSIGNIFICANT GROWTH Performed at [place] [address], [place], [place] [address]    Report Status 11/01/2021 FINAL  Final     Labs: BNP (last 3 results) Recent Labs    09/29/21 2250  BNP 09.3   Basic Metabolic Panel: Recent Labs  Lab 10/28/21 0846 10/29/21 0102 10/30/21 0055 10/31/21 0547 11/01/21 2355  NA 140 133* 132* 131* 133*  K 3.5 3.3* 4.1 3.2* 3.8  CL 115* 105 102 100 104  CO2 20* 24 24 25 26   GLUCOSE 106* 110* 134* 80 118*  BUN 12 7 8 10 10   CREATININE 0.55 0.59 0.61 0.59 0.60  CALCIUM 6.9* 6.8* 7.0* 7.2* 7.7*  MG 1.8 2.1 1.9 2.0 1.9  PHOS  --  2.1* 1.8* 2.2* 2.3*   Liver Function Tests: Recent Labs  Lab 10/28/21 0846 10/29/21 0102 10/30/21 0055 10/31/21 0547 11/01/21 0508  AST 37 34 33 31 29  ALT 17 18 16 15 15   ALKPHOS 51 56 58 49 43  BILITOT 0.9 1.1 1.2 1.4* 1.0  PROT 5.4* 5.2* 5.4* 5.2* 5.5*  ALBUMIN 2.5* 2.4* 2.5* 2.7* 2.8*   Recent Labs  Lab 10/26/21 1757  LIPASE 38   No results for input(s): AMMONIA in the last 168 hours. CBC: Recent Labs  Lab 10/28/21 1715 10/29/21 0102 10/29/21 0912 10/29/21 1716 10/30/21 0055 10/31/21 0547 11/01/21 0508  WBC 10.3 7.8  --   --  8.1 8.1 6.5  NEUTROABS  --  5.9  --   --  6.5 5.7 5.3  HGB 10.8* 9.7* 11.8* 10.4* 10.0*  10.0* 8.8* 9.2*  HCT 34.5* 30.9* 37.4 32.1* 31.7*  31.8* 28.5* 29.2*  MCV 82.1 80.9  --   --  82.1 83.6 82.7  PLT 71* 64*  --   --  76* 70* 68*   Cardiac Enzymes: No results for input(s): CKTOTAL, CKMB, CKMBINDEX, TROPONINI in the last 168 hours. BNP: Invalid input(s): POCBNP CBG: Recent Labs  Lab 10/31/21 2356 11/01/21 0817 11/01/21 1731 11/02/21 0007 11/02/21 0744  GLUCAP 145* 118* 149*  161* 108*   D-Dimer No results for input(s): DDIMER in the last 72 hours. Hgb A1c No results for input(s): HGBA1C in the last 72 hours. Lipid Profile No results for input(s): CHOL, HDL, LDLCALC, TRIG, CHOLHDL, LDLDIRECT in the last 72 hours. Thyroid function studies No results for input(s): TSH, T4TOTAL, T3FREE, THYROIDAB in the last 72 hours.  Invalid input(s): FREET3 Anemia work up No results for input(s): VITAMINB12, FOLATE, FERRITIN, TIBC, IRON, RETICCTPCT in the last 72 hours. Urinalysis    Component Value Date/Time   COLORURINE STRAW (A) 10/30/2021 2030   APPEARANCEUR CLEAR 10/30/2021 2030   LABSPEC 1.004 (L) 10/30/2021 2030   PHURINE 6.0 10/30/2021 2030   GLUCOSEU NEGATIVE 10/30/2021 2030   [place] NEGATIVE 10/30/2021 2030   [place] NEGATIVE 10/30/2021 2030   [place] 10/30/2021 2030   PROTEINUR NEGATIVE 10/30/2021 2030   NITRITE NEGATIVE 10/30/2021 2030   LEUKOCYTESUR NEGATIVE 10/30/2021 2030   Sepsis Labs Invalid input(s): PROCALCITONIN,  WBC,  LACTICIDVEN Microbiology Recent Results (from the past 240 hour(s))  Resp Panel by RT-PCR (Flu A&B, Covid) Nasopharyngeal Swab     Status: None   Collection Time: 10/26/21  5:57 PM   Specimen: Nasopharyngeal Swab; Nasopharyngeal(NP) swabs in vial transport medium  Result Value Ref Range Status   SARS Coronavirus 2 by RT PCR NEGATIVE NEGATIVE Final    Comment: (NOTE) SARS-CoV-2 target nucleic acids are NOT DETECTED.  The SARS-CoV-2 RNA is generally detectable in upper respiratory specimens during the acute phase of infection. The lowest concentration of SARS-CoV-2 viral copies this assay can detect is 138 copies/mL. A negative result does not preclude SARS-Cov-2 infection and should not be used as the sole basis for treatment or other patient management decisions. A negative result may occur with  improper specimen collection/handling, submission of specimen  other than nasopharyngeal swab, presence of viral  mutation(s) within the areas targeted by this assay, and inadequate number of viral copies(<138 copies/mL). A negative result must be combined with clinical observations, patient history, and epidemiological information. The expected result is Negative.  Fact Sheet for Patients:  EntrepreneurPulse.com.au  Fact Sheet for Healthcare Providers:  IncredibleEmployment.be  This test is no t yet approved or cleared by the [place] FDA and  has been authorized for detection and/or diagnosis of SARS-CoV-2 by FDA under an Emergency Use Authorization (EUA). This EUA will remain  in effect (meaning this test can be used) for the duration of the COVID-19 declaration under Section 564(b)(1) of the Act, 21 U.S.C.section 360bbb-3(b)(1), unless the authorization is terminated  or revoked sooner.       Influenza A by PCR NEGATIVE NEGATIVE Final   Influenza B by PCR NEGATIVE NEGATIVE Final    Comment: (NOTE) The Xpert Xpress SARS-CoV-2/FLU/RSV plus assay is intended as an aid in the diagnosis of influenza from Nasopharyngeal swab specimens and should not be used as a sole basis for treatment. Nasal washings and aspirates are unacceptable for Xpert Xpress SARS-CoV-2/FLU/RSV testing.  Fact Sheet for Patients: EntrepreneurPulse.com.au  Fact Sheet for Healthcare Providers: IncredibleEmployment.be  This test is not yet approved or cleared by the [place] FDA and has been authorized for detection and/or diagnosis of SARS-CoV-2 by FDA under an Emergency Use Authorization (EUA). This EUA will remain in effect (meaning this test can be used) for the duration of the COVID-19 declaration under Section 564(b)(1) of the Act, 21 U.S.C. section 360bbb-3(b)(1), unless the authorization is terminated or revoked.  Performed at [facility], [place] [address] [place], [place] [address]   MRSA Next Gen by PCR,  Nasal     Status: None   Collection Time: 10/27/21  3:08 PM   Specimen: Nasal Mucosa; Nasal Swab  Result Value Ref Range Status   MRSA by PCR Next Gen NOT DETECTED NOT DETECTED Final    Comment: (NOTE) The GeneXpert MRSA Assay (FDA approved for NASAL specimens only), is one component of a comprehensive MRSA colonization surveillance program. It is not intended to diagnose MRSA infection nor to guide or monitor treatment for MRSA infections. Test performance is not FDA approved in patients less than 47 years old. Performed at [facility], [place] [address], [place], [place] [address]   Body fluid culture w Gram Stain     Status: None   Collection Time: 10/28/21  2:58 PM   Specimen: Pleural Fluid  Result Value Ref Range Status   Specimen Description   Final    PLEURAL Performed at [facility] [address] [place], [place] [address]    Special Requests   Final    NONE Performed at [facility], [place] [address] [place], [place] [address]    Gram Stain   Final    NO SQUAMOUS EPITHELIAL CELLS SEEN FEW WBC SEEN NO ORGANISMS SEEN    Culture   Final    NO GROWTH 3 DAYS Performed at [facility], [place] [address], [place], [place] [address]    Report Status 11/01/2021 FINAL  Final  Culture, blood (routine x 2)     Status: None (Preliminary result)   Collection Time: 10/30/21  3:37 PM   Specimen: BLOOD  Result Value Ref Range Status   Specimen Description   Final    BLOOD BLOOD RIGHT FOREARM Performed at [facility], [place] [address], [place], [place] [address]    Special Requests   Final    BOTTLES DRAWN AEROBIC ONLY Blood Culture results may not be optimal due to an inadequate volume of blood received in culture bottles Performed at [facility] [address] [place], [place] [address]    Culture   Final    NO GROWTH 3 DAYS Performed at [facility], [place] [address], [place], [place] [address]    Report Status PENDING  Incomplete  Culture, blood (routine x 2)     Status: None (Preliminary result)   Collection Time: 10/30/21  3:37 PM   Specimen: BLOOD  Result Value Ref Range Status   Specimen Description   Final    BLOOD BLOOD RIGHT FOREARM Performed at [facility] [address], [place], [place] [address]    Special Requests   Final    BOTTLES DRAWN AEROBIC ONLY Blood Culture adequate volume Performed at [facility] [address], [place], [place] [address]    Culture   Final    NO GROWTH 3 DAYS Performed at [facility], [place] [address], [place], [place] [address]    Report Status PENDING  Incomplete  Resp Panel by RT-PCR (Flu A&B, Covid) Nasopharyngeal Swab     Status: Abnormal   Collection Time: 10/30/21  3:45 PM   Specimen: Nasopharyngeal Swab; Nasopharyngeal(NP) swabs in vial transport medium  Result Value Ref Range Status   SARS Coronavirus 2 by RT PCR POSITIVE (A) NEGATIVE Final    Comment: RESULT CALLED TO, READ BACK BY AND VERIFIED WITH: E.HAWKINS, RN AT 2028 ON 12.02.22 BY N.THOMPSON (NOTE) SARS-CoV-2 target nucleic acids are DETECTED.  The SARS-CoV-2 RNA is generally detectable in upper respiratory specimens during the acute phase of infection. Positive results are indicative of the presence of the identified virus, but do not rule out bacterial infection or co-infection with other pathogens not detected by the test. Clinical correlation with patient history and other diagnostic information is necessary to determine patient infection status. The expected result is Negative.  Fact Sheet for Patients: EntrepreneurPulse.com.au  Fact Sheet for Healthcare Providers: IncredibleEmployment.be  This test is not yet approved or cleared by the [place] FDA and  has been authorized for detection and/or diagnosis of SARS-CoV-2 by FDA under an  Emergency Use Authorization (EUA).  This EUA will remain in effect (meaning th is test can be used) for the duration of  the COVID-19 declaration under Section 564(b)(1) of the Act, 21 U.S.C. section 360bbb-3(b)(1), unless the authorization is terminated or revoked sooner.     Influenza A by PCR NEGATIVE NEGATIVE Final   Influenza B by PCR NEGATIVE NEGATIVE Final    Comment: (NOTE) The Xpert Xpress SARS-CoV-2/FLU/RSV plus assay is intended as an aid in the diagnosis of influenza from Nasopharyngeal swab specimens and should not be used as a sole basis for treatment. Nasal washings and aspirates are unacceptable for Xpert Xpress SARS-CoV-2/FLU/RSV testing.  Fact Sheet for Patients: EntrepreneurPulse.com.au  Fact Sheet for Healthcare Providers: IncredibleEmployment.be  This test is not yet approved or cleared by the [place] FDA and has been authorized for detection and/or diagnosis of SARS-CoV-2 by FDA under an Emergency Use Authorization (EUA). This EUA will remain in effect (meaning this test can be used) for the duration of the COVID-19 declaration under Section 564(b)(1) of the Act, 21 U.S.C. section 360bbb-3(b)(1), unless the authorization is terminated or revoked.  Performed  at [facility], [place] [address], [place], [place] [address]   Urine Culture     Status: Abnormal   Collection Time: 10/30/21  8:30 PM   Specimen: Urine, Clean Catch  Result Value Ref Range Status   Specimen Description   Final    URINE, CLEAN CATCH Performed at [facility], [place] [address] [place], [place] [address]    Special Requests   Final    NONE Performed at [facility], [place] [address], [place], [place] [address]    Culture (A)  Final    <10,000 COLONIES/mL INSIGNIFICANT GROWTH Performed at [facility] [address] [place], [place] [address]    Report Status 11/01/2021 FINAL   Final     Time coordinating discharge:  I have spent 35 minutes face to face with the patient and on the ward discussing the patients care, assessment, plan and disposition with other care givers. >50% of the time was devoted counseling the patient about the risks and benefits of treatment/Discharge disposition and coordinating care.   SIGNED:   Damita Lack, MD  [facility] 11/02/2021, 4:06 PM   If 7PM-7AM, please contact night-coverage

## 2021-11-02 NOTE — Progress Notes (Signed)
NAME:  Theresa Rocha, MRN:  [id number], DOB:  07/31/1966, LOS: 7 ADMISSION DATE:  10/26/2021, CONSULTATION DATE:  10/26/21 REFERRING MD:  Rogene Houston CHIEF COMPLAINT:  hematemesis  History of Present Illness:  55 y/o F whop presented to [facility] ER on 11/28 with reports of hematemesis.  She has a known history of hepatitis C+, esophageal varices and hepatocellular carcinoma.  Prior to presentation, she had two episodes of hematemesis, frequent bouts of black tarry stools. In the ER she had two episodes of hematemesis.  Her baseline BP normally runs 413 systolic.  She had a left pleural effusion on presentation which was evacuated with thoracentesis (700 ml pleural fluid, no cytology/labs sent). She had a prior thoracentesis on 09/30/21 which was a transudate and positive for atypical cells but favored to be reactive mesothelial cells.  PCCM initially saw for critical care evaluation- BP improved with PRBC resuscitation.  She was stable and admitted per [facility].  On 11/30 CXR showed re accumulation of left pleural fluid.  PCCM called back 11/30 for evaluation of effusion.    Pertinent  Medical History  Reported hx of esophageal varices.  Pleural effusion Iron deficiency anemia Throbmocytopenia Right Breast cancer (on tamoxifen) Hepatocellular carcinoma Cirrhosis Chronic hep C  GERD ETOH hepatitis (?)  Significant Hospital Events: Including procedures, antibiotic start and stop dates in addition to other pertinent events   11/28 Admit  11/30 PCCM re-eval for left effusion, thora with 1.6L clear yellow fluid removed  12/1 No acute events over night, feels breathing is better after thora  12/5 on room air, feeling improved, CXR with persistent L effusion  Interim History / Subjective:  Pt feels subjectively much improved, sitting up on room and has been able to ambulate  Objective   Blood pressure 116/79, pulse 62, temperature 98.2 F (36.8 C), temperature source Oral, resp. rate 14, height 5\' 3"   (1.6 m), weight 49.1 kg, last menstrual period 04/20/2013, SpO2 95 %.        Intake/Output Summary (Last 24 hours) at 11/02/2021 1404 Last data filed at 11/01/2021 1738 Gross per 24 hour  Intake --  Output 500 ml  Net -500 ml    Filed Weights   10/26/21 1643 10/27/21 1035 10/30/21 2204  Weight: 49.9 kg 49.9 kg 49.1 kg    General:  well-nourished F, sitting up in bed in no acute distress HEENT: MM pink/moist Neuro: awake and alert, oriented and moving all extremities CV: s1s2 rrr, no m/r/g PULM:  clear bilaterally without tachypnea or dyspnea, on room air GI: soft, bsx4 active  Extremities: warm/dry, no edema  Skin: no rashes or lesions     Resolved Hospital Problem list   Acute Hypoxic Respiratory Failure   Assessment & Plan:   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 -s/p thoracentesis x2 with cytology negative for malignant cells -diuresed with Lasix, weight down from 49.9kg to 48kg -given that effusion has returned relatively rapidly after thoracentesis one week ago in the setting of portal HTN, TIPS would be recommended.  Hospitalist discussed with GI who will follow outpatient for further discussions -RUQ [place] reviewed, no portal occlusion  -ECHO WNL.  Likely RV would be able to tolerate TIPS  -doing well off O2 and ambulatory         Otilio Carpen Theresa Bowerman, PA-C [facility] See Amion for pager If no response to pager , please  call [phone] until 7pm After 7:00 pm call Elink  [phone]

## 2021-11-02 NOTE — Progress Notes (Signed)
This RN reviewed discharge paperwork with patient. All questions addressed. IV x 3 removed, tele removed. Pt leaving in stable condition via private vehicle. No further needs at this time.

## 2021-11-04 LAB — CULTURE, BLOOD (ROUTINE X 2)
Culture: NO GROWTH
Culture: NO GROWTH
Special Requests: ADEQUATE

## 2021-11-05 ENCOUNTER — Other Ambulatory Visit: Payer: BC Managed Care – PPO

## 2021-11-10 ENCOUNTER — Ambulatory Visit (HOSPITAL_COMMUNITY)
Admission: RE | Admit: 2021-11-10 | Discharge: 2021-11-10 | Disposition: A | Discharge status: Home / Self Care | Payer: BC Managed Care – PPO | Source: Ambulatory Visit | Attending: Interventional Radiology | Admitting: Interventional Radiology

## 2021-11-10 ENCOUNTER — Encounter (HOSPITAL_COMMUNITY): Payer: Self-pay

## 2021-11-10 ENCOUNTER — Telehealth: Payer: Self-pay

## 2021-11-10 ENCOUNTER — Ambulatory Visit: Payer: BC Managed Care – PPO | Admitting: Hematology

## 2021-11-10 ENCOUNTER — Other Ambulatory Visit (HOSPITAL_COMMUNITY): Payer: Self-pay | Admitting: Interventional Radiology

## 2021-11-10 DIAGNOSIS — K766 Portal hypertension: Secondary | ICD-10-CM | POA: Diagnosis not present

## 2021-11-10 DIAGNOSIS — K7689 Other specified diseases of liver: Secondary | ICD-10-CM | POA: Diagnosis not present

## 2021-11-10 DIAGNOSIS — C220 Liver cell carcinoma: Secondary | ICD-10-CM | POA: Diagnosis not present

## 2021-11-10 DIAGNOSIS — R0602 Shortness of breath: Secondary | ICD-10-CM

## 2021-11-10 DIAGNOSIS — K7460 Unspecified cirrhosis of liver: Secondary | ICD-10-CM | POA: Diagnosis not present

## 2021-11-10 MED ORDER — IOHEXOL 350 MG/ML SOLN
80.0000 mL | Freq: Once | INTRAVENOUS | Status: AC | PRN
  Administered 2021-11-10: 80 mL via INTRAVENOUS

## 2021-11-10 NOTE — Telephone Encounter (Signed)
Pt called and reports she was in the hospital for 8 days and had an esophageal banding done. Reports today is the first day she was not really dizzy or short of breath and able to move around. She tried to get FMLA but was denied. She is now trying to get a leave from work until the second week of January through [employer]. Pt reports [employer] is supposed to fax over the papers today. She wanted to let Dr. Henrene Pastor know.

## 2021-11-11 ENCOUNTER — Other Ambulatory Visit (HOSPITAL_COMMUNITY): Payer: Self-pay | Admitting: Interventional Radiology

## 2021-11-11 ENCOUNTER — Ambulatory Visit (HOSPITAL_COMMUNITY)
Admission: RE | Admit: 2021-11-11 | Discharge: 2021-11-11 | Disposition: A | Discharge status: Home / Self Care | Payer: BC Managed Care – PPO | Source: Ambulatory Visit | Attending: Interventional Radiology | Admitting: Interventional Radiology

## 2021-11-11 DIAGNOSIS — C220 Liver cell carcinoma: Secondary | ICD-10-CM

## 2021-11-11 DIAGNOSIS — R0602 Shortness of breath: Secondary | ICD-10-CM

## 2021-11-11 MED ORDER — LIDOCAINE HCL 1 % IJ SOLN
INTRAMUSCULAR | Status: AC
  Filled 2021-11-11: qty 20

## 2021-11-11 NOTE — Progress Notes (Signed)
Chief Complaint: Patient was seen in follow up for hepatocellular carcinoma and a recurrent left pleural effusioin.  Patient Status: [facility] - Out-pt  History of Present Illness: Theresa Rocha is a 55 y.o. female with a history of chronic hepatitis C and cirrhosis who is status post microwave thermal ablation of a 1.8 cm left lobe hepatocellular carcinoma on 09/16/2021. She has had a recurrent left pleural effusion after ablation initially felt to be a sympathetic effusion related to diaphragmatic irritation as the treatment was just below the left hemidiaphragm. She has required several thoracentesis procedures since ablation once she starts to experience dyspnea with light activity. CTA of the chest on 09/30/2021 also showed some increased prominence of an area of arterial enhancement in the posterior right lobe of the liver in segment VII measuring up to approximately 3 x 4 cm.  She was recently admitted from 10/26/21 to 11/02/21 for an acute esophageal variceal bleed requiring transfusion and banding of varices x 3 by Dr. Henrene Pastor on 10/27/21. She had no further active bleeding during the hospitalization after banding. She did have a left thoracentesis on 11/28 of 700 mL just before her episodes of hematemesis and a left thoracentesis in the hospital on 11/30 of 1.6 L. Since discharge from the hospital, Theresa Rocha has been weak, but gradually improving with improving appetite. She has lost weight. She did acquire COVID in the hospital and has had some cough. She has had no bleeding or melena since hospitalization. She is only talking Lasix, 40 mg daily. She is not taking Aldactone because it gave her diarrhea, dry mouth and nausea in the past. She has no significant abdominal pain with occasional brief right sided pain. She is currently not significantly short of breath and able to perform daily activities without dyspnea.  Imaging in the hospital included a CT of the abdomen and pelvis on 10/26/21, RUQ [place] on  10/29/21 and MRI of the abdomen on 10/29/21. Multiphase CT of the abdomen was performed on 11/10/21 to better assess liver lesions.   She is here today to discuss CT results, possible liver directed therapy, possible TIPS and possible left thoracentesis today.  Past Medical History:  Diagnosis Date   Anemia    Breast cancer ([place]) 08/30/2019   Cancer ([place])    liver- just diagnosed, not started treatment yet   Cervicalgia    COPD (chronic obstructive pulmonary disease) (HCC)    Esophageal varices (HCC)    GERD (gastroesophageal reflux disease)    Headache    Hepatic cirrhosis (HCC)    Hepatitis C    resolved 2018   Hepatocellular carcinoma (HCC)    IBS (irritable bowel syndrome)    Lactose intolerance    Personal history of radiation therapy 11/2019   Right breast   Portal hypertension ([place])    Raynaud's syndrome    Thrombocytopenia ([place])     Past Surgical History:  Procedure Laterality Date   BREAST BIOPSY Right 09/2019   BREAST LUMPECTOMY Right 09/2019   BREAST LUMPECTOMY WITH AXILLARY LYMPH NODE BIOPSY Right 10/16/2019   Procedure: RIGHT BREAST LUMPECTOMY WITH SENTINEL LYMPH NODE BIOPSY;  Surgeon: Stark Klein, MD;  Location: [facility];  Service: General;  Laterality: Right;   COLONOSCOPY     ESOPHAGEAL BANDING  10/27/2021   Procedure: ESOPHAGEAL BANDING;  Surgeon: Irene Shipper, MD;  Location: [facility];  Service: Endoscopy;;   ESOPHAGOGASTRODUODENOSCOPY (EGD) WITH PROPOFOL N/A 10/27/2021   Procedure: ESOPHAGOGASTRODUODENOSCOPY (EGD) WITH PROPOFOL;  Surgeon: Irene Shipper, MD;  Location: [facility];  Service: Endoscopy;  Laterality: N/A;   HAMMER TOE SURGERY Left 2012   pins placed in great toe, 2nd,3rd, 4th toes, all pins removed except great toe   IR PARACENTESIS  11/13/2019   IR RADIOLOGIST EVAL & MGMT  08/16/2019   IR RADIOLOGIST EVAL & MGMT  02/06/2020   IR RADIOLOGIST EVAL & MGMT  06/26/2020   IR RADIOLOGIST EVAL & MGMT  12/31/2020   IR RADIOLOGIST EVAL & MGMT   07/21/2021   IR RADIOLOGIST EVAL & MGMT  10/08/2021   IR THORACENTESIS ASP PLEURAL SPACE W/IMG GUIDE  10/12/2021   IR THORACENTESIS ASP PLEURAL SPACE W/IMG GUIDE  10/26/2021   PELVIC LAPAROSCOPY Bilateral 1992   RADIOLOGY WITH ANESTHESIA N/A 09/16/2021   Procedure: CT MICROWAVE ABLATION;  Surgeon: Aletta Edouard, MD;  Location: [facility];  Service: Radiology;  Laterality: N/A;   UPPER GASTROINTESTINAL ENDOSCOPY  2002   had esophageal dilitation    Allergies: Hydromorphone hcl and Aspirin  Medications: Prior to Admission medications   Medication Sig Start Date End Date Taking? Authorizing Provider  dicyclomine (BENTYL) 20 MG tablet Take one tablet every 4-6 hours as needed for abdominal cramping Patient taking differently: Take 20 mg by mouth every 4 (four) hours as needed (abdominal cramping). 11/21/19   Irene Shipper, MD  furosemide (LASIX) 40 MG tablet Take 1 tablet (40 mg total) by mouth daily. 11/02/21 12/02/21  Amin, Jeanella Flattery, MD  ibuprofen (ADVIL) 200 MG tablet Take 400 mg by mouth every 6 (six) hours as needed for mild pain.    [provider]  loperamide (IMODIUM) 2 MG capsule Take 4 mg by mouth every other day.    [provider]  midodrine (PROAMATINE) 10 MG tablet Take 1 tablet (10 mg total) by mouth 3 (three) times daily with meals. 11/02/21 12/02/21  Amin, Jeanella Flattery, MD  nicotine (NICODERM CQ - DOSED IN MG/24 HOURS) 21 mg/24hr patch Place 21 mg onto the skin daily as needed (smoking cessation on work days).    [provider]  OVER THE COUNTER MEDICATION Take 2 tablets by mouth every morning. Over the counter acid reducer    [provider]  pantoprazole (PROTONIX) 40 MG tablet TAKE 1 TABLET (40 MG TOTAL) BY MOUTH DAILY. OFFICE VISIT FOR FUTURE REFILLS 11/04/21   Irene Shipper, MD  promethazine (PHENERGAN) 25 MG tablet Take 1 tablet (25 mg total) by mouth every 8 (eight) hours as needed for nausea or vomiting. 05/25/21   Edrick Kins, DPM   spironolactone (ALDACTONE) 50 MG tablet Take 50 mg by mouth every morning. 10/09/21   [provider]  sucralfate (CARAFATE) 1 GM/10ML suspension Take 10 mLs (1 g total) by mouth 2 (two) times daily. 11/02/21 12/02/21  Amin, Jeanella Flattery, MD  tamoxifen (NOLVADEX) 20 MG tablet TAKE 1 TABLET BY MOUTH EVERY DAY Patient taking differently: Take 20 mg by mouth every morning. 10/13/20   Truitt Merle, MD     Family History  Problem Relation Age of Onset   Liver cancer Mother    Lung cancer Mother    Lung cancer Father    Liver cancer Maternal Grandmother    Colon cancer Neg Hx    Esophageal cancer Neg Hx    Stomach cancer Neg Hx    Rectal cancer Neg Hx     Social History   Socioeconomic History   Marital status: Married    Spouse name: Not on file   Number of children:  Not on file   Years of education: Not on file   Highest education level: Not on file  Occupational History   Not on file  Tobacco Use   Smoking status: Every Day    Packs/day: 0.50    Years: 30.00    Pack years: 15.00    Types: Cigarettes    Start date: 11/29/1978   Smokeless tobacco: Never  Vaping Use   Vaping Use: Never used  Substance and Sexual Activity   Alcohol use: Not Currently    Alcohol/week: 6.0 standard drinks    Types: 6 Cans of beer per week    Comment: stopped 06/2019   Drug use: No   Sexual activity: Not Currently    Partners: Male  Other Topics Concern   Not on file  Social History Narrative   Not on file   Social Determinants of Health   Financial Resource Strain: Not on file  Food Insecurity: Not on file  Transportation Needs: Not on file  Physical Activity: Not on file  Stress: Not on file  Social Connections: Not on file    ECOG Status: 1 - Symptomatic but completely ambulatory  Review of Systems: A 12 point ROS discussed and pertinent positives are indicated in the HPI above.  All other systems are negative.  Review of Systems  Constitutional:  Positive for activity  change, fatigue and unexpected weight change.  Respiratory:  Positive for cough.   Cardiovascular: Negative.   Gastrointestinal: Negative.   Genitourinary: Negative.   Musculoskeletal: Negative.   Neurological: Negative.     Imaging: DG Chest 1 View  Result Date: 10/30/2021 CLINICAL DATA:  Pleural effusion EXAM: CHEST  1 VIEW COMPARISON:  Chest x-ray dated October 28, 2021 FINDINGS: Visualized cardiac and mediastinal contours are unchanged. Moderate left pleural effusion, increased in size when compared to prior exam. Probable trace right pleural effusion. Mild bilateral interstitial opacities. No evidence of pneumothorax. IMPRESSION: 1. Moderate left pleural effusion, increased in size when compared to the prior exam. 2. Mild bilateral interstitial opacities, possibly due to pulmonary edema. Electronically Signed   By: Yetta Glassman M.D.   On: 10/30/2021 08:33   DG Chest 1 View  Result Date: 10/28/2021 CLINICAL DATA:  Post left thoracentesis. EXAM: CHEST  1 VIEW COMPARISON:  Chest x-ray 10/28/2021. FINDINGS: There is a small left pleural effusion which has decreased when compared to the prior study. There is no pneumothorax. Cardiomediastinal silhouette is within normal limits. There is some minimal opacities in the right mid lung and right lower lung which have mildly increased. Surgical clips overlie the right chest. No acute fractures are seen. IMPRESSION: 1. Small left pleural effusion, decreased from prior. 2. No pneumothorax. 3. Mild increase in right mid and lower lung airspace disease. Electronically Signed   By: Ronney Asters M.D.   On: 10/28/2021 15:44   DG Chest 1 View  Result Date: 10/26/2021 CLINICAL DATA:  Pleural effusion, status post left thoracentesis. EXAM: CHEST  1 VIEW COMPARISON:  10/12/2021 and CT chest 09/30/2021. FINDINGS: Trachea is midline. Heart size normal. Small residual left pleural effusion, decreased from 10/12/2021. Trace left apical pneumothorax. Minimal  left basilar atelectasis. Right lung is clear. Surgical clips in right axilla. IMPRESSION: 1. Trace left apical pneumothorax status post left thoracentesis. Finding may not be clinically significant. These results were called by telephone at the time of interpretation on 10/26/2021 at 1:38 pm to provider [facility] , who verbally acknowledged these results. 2. Small residual left pleural effusion  with left basilar subsegmental atelectasis. Electronically Signed   By: Leanna Battles M.D.   On: 10/26/2021 13:38   MR ABDOMEN W WO CONTRAST  Result Date: 10/30/2021 CLINICAL DATA:  Abdominal pain, gastroesophageal varices, multifocal hepatocellular carcinoma status post left lobe microwave ablation EXAM: MRI ABDOMEN WITHOUT AND WITH CONTRAST TECHNIQUE: Multiplanar multisequence MR imaging of the abdomen was performed both before and after the administration of intravenous contrast. CONTRAST:  5.20mL GADAVIST GADOBUTROL 1 MMOL/ML IV SOLN COMPARISON:  MR abdomen, 07/17/2021, CT abdomen pelvis, 10/26/2021 FINDINGS: Lower chest: Large left, small right pleural effusions and associated atelectasis or consolidation. Hepatobiliary: Hepatic steatosis. Coarse, nodular, cirrhotic morphology of the liver. There is an ablation defect of the anterior left lobe of the liver, hepatic segment II, without residual contrast enhancement, which subtends both left lobe hyperenhancing lesions identified on prior MR (series 26, image 28). Significant interval increase in size of a hyperenhancing lesion of the posterior liver dome, hepatic segment VII, measuring 4.0 x 3.7 cm, previously 2.6 x 2.5 cm when measured similarly (series 21, image 30). This demonstrates some heterogeneous contrast enhancement although no clear evidence of washout or capsular enhancement. Slight interval increase in size of a hyperenhancing lesion of the inferior tip of the right lobe of the liver, hepatic segment VI, measuring 1.1 x 0.7 cm, previously no greater  than 0.6 cm (series 21, image 56). There is a lesion just superiorly in hepatic segment VI which is slightly increased in size, measuring 0.6 cm, previously no greater than 0.4 cm (series 21, image 50). No evidence of washout or capsular enhancement in the small lesions. Layering sludge in the gallbladder with mild gallbladder wall thickening and pericholecystic fluid, similar to prior examination although nonspecific in the setting of ascites. No discrete gallstones. No biliary ductal dilatation. Pancreas: No mass, inflammatory changes, or other parenchymal abnormality identified. Spleen:  Within normal limits in size and appearance. Adrenals/Urinary Tract: No masses identified. No evidence of hydronephrosis. Stomach/Bowel: Visualized portions within the abdomen are unremarkable. Vascular/Lymphatic: No pathologically enlarged lymph nodes identified. No abdominal aortic aneurysm demonstrated. Portal veins are patent. Other:  Small volume ascites throughout the abdomen. Musculoskeletal: No suspicious bone lesions identified. IMPRESSION: 1. Ablation defect of the left lobe of the liver which subtends two lesions previously noted in hepatic segment II. No evidence of residual contrast enhancement to suggest viable tumor. 2. Significant interval increase in size of a hyperenhancing lesion of the posterior liver dome, hepatic segment VII, measuring 4.0 x 3.7 cm, previously 2.6 x 2.5 cm when measured similarly. This demonstrates some heterogeneous contrast enhancement although no clear evidence of washout or capsular enhancement. Findings remain LI-RADS category 5, consistent with hepatocellular carcinoma. 3. Slight interval increase in size of a hyperenhancing lesion of the inferior tip of the right lobe of the liver, hepatic segment VI, measuring 1.1 x 0.7 cm, previously no greater than 0.6 cm, as well as an additional adjacent lesion measuring 0.6 cm, previously 0.4 cm. Given threshold growth, these lesions are  upstaged to LI-RADS category 4 and suspicious for additional small foci of hepatocellular carcinoma. 4. Cirrhotic morphology of the liver and hepatic steatosis. 5. Small volume ascites throughout the abdomen. 6. Large left, small right pleural effusions and associated atelectasis or consolidation. 7. Layering sludge in the gallbladder with mild gallbladder wall thickening and pericholecystic fluid, similar to prior examination although nonspecific in the setting of ascites. No discrete gallstones. Electronically Signed   By: Jearld Lesch M.D.   On: 10/30/2021 08:28  CT Abdomen Pelvis W Contrast  Result Date: 10/26/2021 CLINICAL DATA:  Acute abdominal pain. History of esophageal varices. Hemoptysis of bright red blood yesterday. Black tarry stools since yesterday. EXAM: CT ABDOMEN AND PELVIS WITH CONTRAST TECHNIQUE: Multidetector CT imaging of the abdomen and pelvis was performed using the standard protocol following bolus administration of intravenous contrast. CONTRAST:  62mL OMNIPAQUE IOHEXOL 350 MG/ML SOLN COMPARISON:  CT abdomen dated 07/04/2019 FINDINGS: Lower chest: LEFT pleural effusion, at least moderate in size, with adjacent atelectasis. RIGHT lung base is clear. Esophageal varices again identified within the lower esophagus, incompletely imaged. Hepatobiliary: Hypodense mass within the LEFT liver lobe, segment 2, measuring 4 cm, new compared to the CT abdomen of 07/04/2019 but most recently described on multiple abdominal MRIs, most recently described on MRI abdomen of 07/17/2021 as a known hepatocellular carcinoma. Additional hypodense lesion within the posterior RIGHT liver lobe, measuring 2.5 cm, with ring enhancement, compatible with another known neoplastic lesion in segment 6, likely enlarged compared to the most recent MRI abdomen of 07/17/2021. Additional smaller lesions scattered within the liver, also as previously described by MRI. Liver contours again appear nodular throughout,  compatible with underlying cirrhosis. Pancreas: Unremarkable. No pancreatic ductal dilatation or surrounding inflammatory changes. Spleen: Normal in size without focal abnormality. Adrenals/Urinary Tract: Adrenal glands are unremarkable. Kidneys appear normal without mass, stone or hydronephrosis. Stomach/Bowel: No dilated large or small bowel loops. At least mild thickening of the walls of the entire colon. Additional probable thickening of the walls of the distal small bowel. More proximal small bowel is unremarkable. Stomach is unremarkable, partially decompressed. Vascular/Lymphatic: No acute-appearing vascular abnormality. Lower esophageal varices, as described above. Reproductive: Uterus and bilateral adnexa are unremarkable. Other: Trace ascites within the pelvis. No abscess collection. No free intraperitoneal air. Musculoskeletal: No acute-appearing osseous abnormality. IMPRESSION: 1. LEFT pleural effusion, at least moderate in size, with adjacent atelectasis. The pleural effusion is decreased compared to recent chest CT angiogram of 09/30/2021, status post interval thoracentesis. 2. At least mild thickening of the walls of the entire colon and distal small bowel, suggestive of a diffuse enterocolitis of infectious or inflammatory nature. No bowel obstruction. 3. Prominent esophageal varices at the level of the lower esophagus, incompletely imaged. Can not exclude active hemorrhage within the lower esophagus, but no layering hemorrhage within the stomach to confirm active hemorrhage. 4. Cirrhotic liver. 5. Known neoplastic lesions within the liver, most recently described on MRI abdomen of 07/17/2021. 6. Trace ascites within the pelvis. No abscess collection. No free intraperitoneal air. Electronically Signed   By: Franki Cabot M.D.   On: 10/26/2021 20:33   MR 3D Recon At Scanner  Result Date: 10/30/2021 CLINICAL DATA:  Abdominal pain, gastroesophageal varices, multifocal hepatocellular carcinoma status  post left lobe microwave ablation EXAM: MRI ABDOMEN WITHOUT AND WITH CONTRAST TECHNIQUE: Multiplanar multisequence MR imaging of the abdomen was performed both before and after the administration of intravenous contrast. CONTRAST:  5.28mL GADAVIST GADOBUTROL 1 MMOL/ML IV SOLN COMPARISON:  MR abdomen, 07/17/2021, CT abdomen pelvis, 10/26/2021 FINDINGS: Lower chest: Large left, small right pleural effusions and associated atelectasis or consolidation. Hepatobiliary: Hepatic steatosis. Coarse, nodular, cirrhotic morphology of the liver. There is an ablation defect of the anterior left lobe of the liver, hepatic segment II, without residual contrast enhancement, which subtends both left lobe hyperenhancing lesions identified on prior MR (series 26, image 28). Significant interval increase in size of a hyperenhancing lesion of the posterior liver dome, hepatic segment VII, measuring 4.0 x 3.7 cm,  previously 2.6 x 2.5 cm when measured similarly (series 21, image 30). This demonstrates some heterogeneous contrast enhancement although no clear evidence of washout or capsular enhancement. Slight interval increase in size of a hyperenhancing lesion of the inferior tip of the right lobe of the liver, hepatic segment VI, measuring 1.1 x 0.7 cm, previously no greater than 0.6 cm (series 21, image 56). There is a lesion just superiorly in hepatic segment VI which is slightly increased in size, measuring 0.6 cm, previously no greater than 0.4 cm (series 21, image 50). No evidence of washout or capsular enhancement in the small lesions. Layering sludge in the gallbladder with mild gallbladder wall thickening and pericholecystic fluid, similar to prior examination although nonspecific in the setting of ascites. No discrete gallstones. No biliary ductal dilatation. Pancreas: No mass, inflammatory changes, or other parenchymal abnormality identified. Spleen:  Within normal limits in size and appearance. Adrenals/Urinary Tract: No  masses identified. No evidence of hydronephrosis. Stomach/Bowel: Visualized portions within the abdomen are unremarkable. Vascular/Lymphatic: No pathologically enlarged lymph nodes identified. No abdominal aortic aneurysm demonstrated. Portal veins are patent. Other:  Small volume ascites throughout the abdomen. Musculoskeletal: No suspicious bone lesions identified. IMPRESSION: 1. Ablation defect of the left lobe of the liver which subtends two lesions previously noted in hepatic segment II. No evidence of residual contrast enhancement to suggest viable tumor. 2. Significant interval increase in size of a hyperenhancing lesion of the posterior liver dome, hepatic segment VII, measuring 4.0 x 3.7 cm, previously 2.6 x 2.5 cm when measured similarly. This demonstrates some heterogeneous contrast enhancement although no clear evidence of washout or capsular enhancement. Findings remain LI-RADS category 5, consistent with hepatocellular carcinoma. 3. Slight interval increase in size of a hyperenhancing lesion of the inferior tip of the right lobe of the liver, hepatic segment VI, measuring 1.1 x 0.7 cm, previously no greater than 0.6 cm, as well as an additional adjacent lesion measuring 0.6 cm, previously 0.4 cm. Given threshold growth, these lesions are upstaged to LI-RADS category 4 and suspicious for additional small foci of hepatocellular carcinoma. 4. Cirrhotic morphology of the liver and hepatic steatosis. 5. Small volume ascites throughout the abdomen. 6. Large left, small right pleural effusions and associated atelectasis or consolidation. 7. Layering sludge in the gallbladder with mild gallbladder wall thickening and pericholecystic fluid, similar to prior examination although nonspecific in the setting of ascites. No discrete gallstones. Electronically Signed   By: Delanna Ahmadi M.D.   On: 10/30/2021 08:28   CT ABDOMEN W WO CONTRAST  Result Date: 11/10/2021 CLINICAL DATA:  History of hepatocellular  carcinoma post microwave ablation. EXAM: CT ABDOMEN WITHOUT AND WITH CONTRAST TECHNIQUE: Multidetector CT imaging of the abdomen was performed following the standard protocol before and following the bolus administration of intravenous contrast. CONTRAST:  44mL OMNIPAQUE IOHEXOL 350 MG/ML SOLN COMPARISON:  October 26, 2021. FINDINGS: Lower chest: Enlarging LEFT-sided effusion. Signs of pulmonary emphysema. Pleural fluid in the LEFT chest is now moderately large and substantially increased from previous imaging. No visible pleural nodularity. Hepatobiliary: Signs of hepatic cirrhosis. Evidence of portal hypertension as seen on previous imaging. Observation#: 1 Location: Segment II Tumor in Vein: No LR-M Features: None Nonrim APHE: Enhancement at the periphery is transient along the RIGHT lateral margin of the treated area. Small hypodense area at the periphery is also stable. No non rim arterial phase enhancement or washout. Threshold growth: Not applicable Washout Appearance: Not applicable Capsule Appearance: Not applicable Ancillary Features: Favoring benignity: Post treatment related  changes Favoring malignancy: Hyperenhancement along the margin without masslike features. Overall Assessment: LI-RADS category LR TR nonviable Observation 2: Area of hypervascularity in the posterior RIGHT hemi liver as measured on venous phase measuring approximately 3.9 x 3.6 cm., this is enlarging over time. Arterial enhancement is more pronounced than on previous imaging but may be due in part to perfusional changes. Dilated biliary or venous structures are suggested based on serpiginous appearance in the area of concern. These areas are low attenuation on all phases. There is no definite sign of washout. Early imaging showed peripheral arterial or "rim like arterial phase enhancement" CT imaging from July 17, 2021. Observation 3: Enlarging lesion along the inferior RIGHT hepatic margin measuring 12 x 10 mm with non rim  arterial phase enhancement (image 49/3) this is without definite signs of washout but based on "threshold growth" characterized as LI-RADS category 5. Scattered areas of arterial phase enhancement also in hepatic subsegment VI without signs of washout in aggregate as observation 3 categorized as LI-RADS category III but more suspicious given other findings on the current study. Mild gallbladder wall thickening. No substantial biliary duct dilation. Pancreas: Normal, without mass, inflammation or ductal dilatation. Spleen: Spleen is mildly enlarged as before. Signs of splenules in the LEFT upper quadrant similar to prior imaging. Adrenals/Urinary Tract: Normal adrenal glands and kidneys. Stomach/Bowel: Signs of varices in the setting of portal hypertension. Vascular/Lymphatic: No adenopathy in the abdomen. Patent abdominal vasculature including the portal vein with signs of portosystemic collaterals to include esophageal varices. Other: No ascites. Musculoskeletal: No acute or significant osseous findings. IMPRESSION: Post treatment changes about the LEFT hepatic lobe lesion with variable arterial enhancement not showing washout and with non masslike features, favor perfusional defect, characterized as TR equivocal but favor nonviable. Lesion in the posterior RIGHT hepatic lobe over time with enlargement and with serpiginous internal low signal on MR and low attenuation on CT. Findings categorized as LR M given signs of rim like enhancement on previous imaging and infiltrative appearance. Potentially infiltrative HCC but without specific imaging features of hepatocellular carcinoma by LI-RADS. Enlarging lesion in the inferior RIGHT hepatic lobe greater than 50% diameter increase over 6 month interval, LI-RADS category 5 showing non rim arterial phase enhancement. Additional foci of arterial phase enhancement without definitive washout, categorized as LI-RADS category 3 but in aggregate are more suspicious for small  foci of hepatocellular carcinoma, attention on follow-up. Electronically Signed   By: Donzetta Kohut M.D.   On: 11/10/2021 16:16   DG Chest 1V REPEAT Same Day  Result Date: 10/26/2021 CLINICAL DATA:  Post thoracentesis.  Evaluate for pneumothorax EXAM: CHEST - 1 VIEW SAME DAY COMPARISON:  IR ultrasound, earlier same day. Chest XR, 10/26/2021 and 10/09/2021. FINDINGS: Cardiomediastinal silhouette is within normal limits. Lungs are well inflated. Decreased volume of LEFT pleural effusion, as compared to 10/09/2021 chest XR. No enlarging pneumothorax. RIGHT chest and axillary surgical clips. No acute osseous abnormality. IMPRESSION: No enlarging pneumothorax. Electronically Signed   By: Roanna Banning M.D.   On: 10/26/2021 15:28   DG CHEST PORT 1 VIEW  Result Date: 11/01/2021 CLINICAL DATA:  Pleural effusion. EXAM: PORTABLE CHEST 1 VIEW COMPARISON:  10/31/2021 FINDINGS: 0604 hours. Similar moderate left pleural effusion. Right lung clear. The cardiopericardial silhouette is within normal limits for size. The visualized bony structures of the thorax show no acute abnormality. Telemetry leads overlie the chest. IMPRESSION: 1. Similar moderate left pleural effusion. 2. Interval decrease in diffuse interstitial opacity seen previously. Electronically Signed  By: Misty Stanley M.D.   On: 11/01/2021 08:21   DG CHEST PORT 1 VIEW  Result Date: 10/31/2021 CLINICAL DATA:  Pleural effusion. EXAM: PORTABLE CHEST 1 VIEW COMPARISON:  10/30/2021 FINDINGS: 0606 hours. Moderate left pleural effusion is stable to slightly decreased in the interval. Heart size stable. Diffuse interstitial prominence in the right lung is similar to prior. Bones are diffusely demineralized. Telemetry leads overlie the chest. IMPRESSION: Stable to slight decrease in moderate left pleural effusion. Electronically Signed   By: Misty Stanley M.D.   On: 10/31/2021 08:15   DG CHEST PORT 1 VIEW  Result Date: 10/28/2021 CLINICAL DATA:  Cough and  shortness of breath EXAM: PORTABLE CHEST 1 VIEW COMPARISON:  10/26/2021 FINDINGS: Significantly increased left pleural effusion and associated atelectasis. Suspect mild pulmonary edema. Increased heart size. IMPRESSION: Increased, now moderate left pleural effusion with associated atelectasis. Suspect mild pulmonary edema, noting increased heart size. Electronically Signed   By: Macy Mis M.D.   On: 10/28/2021 09:06   DG Chest Port 1 View  Result Date: 10/26/2021 CLINICAL DATA:  Vomiting, possible hematemesis. Possible blood in stool. Left thoracentesis today. EXAM: PORTABLE CHEST 1 VIEW COMPARISON:  10/26/2021 1:06 p.m. and 3:13 p.m. FINDINGS: Emphysema. Small left pleural effusion with a hazy density along the left hemidiaphragm, probably from layering effusion given the semi erect positioning although a component of re-expansion edema in this segment of the lung is not excluded. No visible pneumothorax. Heart size within normal limits. Right axillary and breast clips noted. IMPRESSION: 1. Small remaining left pleural effusion likely layering due to the semi erect positioning. There is some hazy density left lung base probably from layering effusion, less likely from re-expansion edema of this segment of lung. 2. No pneumothorax. 3.  Emphysema (ICD10-J43.9). Electronically Signed   By: Van Clines M.D.   On: 10/26/2021 19:00   DG Chest Port 1 View  Result Date: 10/12/2021 CLINICAL DATA:  Status post left thoracentesis. EXAM: PORTABLE CHEST 1 VIEW COMPARISON:  10/09/2021 FINDINGS: The heart size and mediastinal contours are within normal limits. Significant reduction in left pleural effusion with small residual pleural effusion remaining with associated atelectasis at the left lung base. No pneumothorax. No pulmonary edema. The visualized skeletal structures are unremarkable. IMPRESSION: Reduction in left pleural fluid volume with small residual effusion. No pneumothorax after left  thoracentesis. Electronically Signed   By: Aletta Edouard M.D.   On: 10/12/2021 17:14   ECHOCARDIOGRAM COMPLETE  Result Date: 10/29/2021    ECHOCARDIOGRAM REPORT   Patient Name:   Theresa Rocha Date of Exam: 10/29/2021 Medical Rec #:  [id number]    Height:       63.0 in Accession #:    [id number]   Weight:       110.0 lb Date of Birth:  1966/09/03    BSA:          1.500 m Patient Age:    53 years     BP:           98/66 mmHg Patient Gender: F            HR:           91 bpm. Exam Location:  Inpatient Procedure: 2D Echo Indications:    pulmonary hypertension  History:        Patient has no prior history of Echocardiogram examinations.                 Cancer. Cirrhosis. Pleural effusion.  Sonographer:    [place] Referring Phys: [id number] [place]  1. Left ventricular ejection fraction, by estimation, is 60 to 65%. The left ventricle has normal function. The left ventricle has no regional wall motion abnormalities. Left ventricular diastolic parameters were normal.  2. Right ventricular systolic function is normal. The right ventricular size is normal. Tricuspid regurgitation signal is inadequate for assessing PA pressure.  3. Moderate pleural effusion in the left lateral region.  4. The mitral valve is normal in structure. No evidence of mitral valve regurgitation. No evidence of mitral stenosis.  5. The aortic valve is tricuspid. Aortic valve regurgitation is not visualized. No aortic stenosis is present.  6. The inferior vena cava is normal in size with greater than 50% respiratory variability, suggesting right atrial pressure of 3 mmHg. Comparison(s): No prior Echocardiogram. FINDINGS  Left Ventricle: Left ventricular ejection fraction, by estimation, is 60 to 65%. The left ventricle has normal function. The left ventricle has no regional wall motion abnormalities. The left ventricular internal cavity size was normal in size. There is  no left ventricular hypertrophy. Left  ventricular diastolic parameters were normal. Right Ventricle: The right ventricular size is normal. Right ventricular systolic function is normal. Tricuspid regurgitation signal is inadequate for assessing PA pressure. The tricuspid regurgitant velocity is 2.44 m/s, and with an assumed right atrial  pressure of 3 mmHg, the estimated right ventricular systolic pressure is 67.6 mmHg. Left Atrium: Left atrial size was normal in size. Right Atrium: Right atrial size was normal in size. Pericardium: Trivial pericardial effusion is present. Mitral Valve: The mitral valve is normal in structure. No evidence of mitral valve regurgitation. No evidence of mitral valve stenosis. Tricuspid Valve: The tricuspid valve is normal in structure. Tricuspid valve regurgitation is trivial. No evidence of tricuspid stenosis. Aortic Valve: The aortic valve is tricuspid. Aortic valve regurgitation is not visualized. No aortic stenosis is present. Pulmonic Valve: The pulmonic valve was not well visualized. Pulmonic valve regurgitation is not visualized. No evidence of pulmonic stenosis. Aorta: The aortic root is normal in size and structure. Venous: The inferior vena cava is normal in size with greater than 50% respiratory variability, suggesting right atrial pressure of 3 mmHg. IAS/Shunts: The interatrial septum was not well visualized. Additional Comments: There is a moderate pleural effusion in the left lateral region.  LEFT VENTRICLE PLAX 2D LVIDd:         3.20 cm   Diastology LVIDs:         1.80 cm   LV e' medial:    8.05 cm/s LV PW:         0.70 cm   LV E/e' medial:  12.7 LV IVS:        0.60 cm   LV e' lateral:   7.40 cm/s LVOT diam:     1.50 cm   LV E/e' lateral: 13.8 LV SV:         33 LV SV Index:   22 LVOT Area:     1.77 cm  RIGHT VENTRICLE             IVC RV S prime:     13.90 cm/s  IVC diam: 1.30 cm LEFT ATRIUM             Index        RIGHT ATRIUM          Index LA diam:        2.50 cm 1.67 cm/m  RA Area:     8.04 cm LA Vol  (A2C):   23.1 ml 15.40 ml/m  RA Volume:   14.30 ml 9.53 ml/m LA Vol (A4C):   23.0 ml 15.33 ml/m LA Biplane Vol: 23.7 ml 15.80 ml/m  AORTIC VALVE LVOT Vmax:   114.00 cm/s LVOT Vmean:  75.900 cm/s LVOT VTI:    0.186 m  AORTA Ao Root diam: 2.90 cm Ao Asc diam:  2.60 cm MITRAL VALVE                TRICUSPID VALVE MV Area (PHT): 4.41 cm     TR Peak grad:   23.8 mmHg MV Decel Time: 172 msec     TR Vmax:        244.00 cm/s MV E velocity: 102.00 cm/s MV A velocity: 64.90 cm/s   SHUNTS MV E/A ratio:  1.57         Systemic VTI:  0.19 m                             Systemic Diam: 1.50 cm Kirk Ruths MD Electronically signed by Kirk Ruths MD Signature Date/Time: 10/29/2021/2:54:21 PM    Final    US Abdomen Limited RUQ (LIVER/GB)  Result Date: 10/29/2021 CLINICAL DATA:  Assess for portal vein thrombosis. EXAM: ULTRASOUND ABDOMEN LIMITED RIGHT UPPER QUADRANT COMPARISON:  Abdomen/pelvis CT 10/26/2021 FINDINGS: Gallbladder: Gallbladder wall is thickened and edematous measuring up to 8 mm. Sonographer reports no sonographic Murphy sign. No evidence for gallstones. Ascites/pericholecystic fluid evident. Common bile duct: Diameter: 6 mm Liver: Nodular liver contour with echogenic parenchyma. Posterior right hepatic lobe lesion measuring 3.7 cm was better characterized on recent CT. 2.0 cm heterogeneous lesion identified lateral segment left liver, also noted on prior CT. Portal vein is patent on color Doppler imaging with normal direction of blood flow towards the liver. Other: Right pleural effusion. IMPRESSION: 1. Main portal vein is patent. 2. Gallbladder wall thickening with pericholecystic fluid. This is a nonspecific finding in the setting of cirrhosis and ascites. No gallstones are evident. 3. Cirrhosis with 2 hepatic lesions identified, better characterized on previous MRI. 4. Right pleural effusion. Electronically Signed   By: Misty Stanley M.D.   On: 10/29/2021 08:19   IR THORACENTESIS ASP PLEURAL SPACE W/IMG  GUIDE  Result Date: 10/26/2021 INDICATION: History of hepatocellular carcinoma, shortness of breast. Request for therapeutic thoracentesis. EXAM: ULTRASOUND GUIDED LEFT THORACENTESIS MEDICATIONS: 10 mL 1% lidocaine COMPLICATIONS: SIR Level A - No therapy, no consequence. PROCEDURE: An ultrasound guided thoracentesis was thoroughly discussed with the patient and questions answered. The benefits, risks, alternatives and complications were also discussed. The patient understands and wishes to proceed with the procedure. Written consent was obtained. Ultrasound was performed to localize and mark an adequate pocket of fluid in the left chest. The area was then prepped and draped in the normal sterile fashion. 1% Lidocaine was used for local anesthesia. Under ultrasound guidance a 6 Fr Safe-T-Centesis catheter was introduced. Thoracentesis was performed. The catheter was removed and a dressing applied. FINDINGS: A total of approximately 700 cc of hazy yellow fluid was removed. Postprocedure chest x-ray reviewed, trace left apical pneumothorax seen. IMPRESSION: Successful ultrasound guided left thoracentesis yielding 700 cc of pleural fluid. Procedure performed by: Durenda Guthrie, IR PA-C Electronically Signed   By: Michaelle Birks M.D.   On: 10/26/2021 15:25    Labs:  CBC: Recent Labs    10/29/21 0102 10/29/21 0912  10/29/21 1716 10/30/21 0055 10/31/21 0547 11/01/21 0508  WBC 7.8  --   --  8.1 8.1 6.5  HGB 9.7*   < > 10.4* 10.0*   10.0* 8.8* 9.2*  HCT 30.9*   < > 32.1* 31.7*   31.8* 28.5* 29.2*  PLT 64*  --   --  76* 70* 68*   < > = values in this interval not displayed.    COAGS: Recent Labs    10/26/21 2001 10/27/21 0429 10/28/21 0244 10/29/21 0102 10/30/21 0055 10/31/21 0547  INR 1.8*   < > 1.6* 1.5* 1.5* 1.5*  APTT 31  --   --   --   --   --    < > = values in this interval not displayed.    BMP: Recent Labs    10/29/21 0102 10/30/21 0055 10/31/21 0547 11/01/21 0508  NA 133* 132*  131* 133*  K 3.3* 4.1 3.2* 3.8  CL 105 102 100 104  CO2 $Re'24 24 25 26  'swJ$ GLUCOSE 110* 134* 80 118*  BUN $Re'7 8 10 10  'Yzu$ CALCIUM 6.8* 7.0* 7.2* 7.7*  CREATININE 0.59 0.61 0.59 0.60  GFRNONAA >60 >60 >60 >60    LIVER FUNCTION TESTS: Recent Labs    10/29/21 0102 10/30/21 0055 10/31/21 0547 11/01/21 0508  BILITOT 1.1 1.2 1.4* 1.0  AST 34 33 31 29  ALT $Re'18 16 15 15  'hWR$ ALKPHOS 56 58 49 43  PROT 5.2* 5.4* 5.2* 5.5*  ALBUMIN 2.4* 2.5* 2.7* 2.8*    TUMOR MARKERS: Recent Labs    12/16/20 1106  AFPTM 4.4    Assessment and Plan:  I met with Theresa Rocha today and we reviewed all of her recent imaging and had a lengthy discussion about liver directed therapy, possible TIPS, and her recurrent left pleural effusion.  Hepatocellular carcinoma: Current imaging suggests an enlarging malignant lesion in the posterior aspect of segment VII of the right lobe now measuring up to approximately 3.6 x 4 cm. Enhancement characteristics by MR and CT are now consistent with hepatocellular carcinoma. There is also an enlarging nodular lesion in the inferior tip of the right lobe in segment VI measuring 1 x 1.2 cm and consistent with HCC. Other small foci of arterial hyperenhancement also visible in segment VI. Enhancement along superior aspect of the left lobe ablation defect by CT is favored to be a perfusion defect and not recurrent malignancy, which is also supported by recent MRI.  It now appears that she has multifocal [place] in the right lobe of the liver and the best treatment would be directed Y-90 radioembolization. She is recovering after her hospitalization and feels stronger every day. Labs are fine for treatment at this time. I recommended we proceed with authorization and scheduling for Y-90 treatment of the right lobe, and Theresa Rocha is in agreement. I think this is the most important thing to address at this time.  TIPS procedure: The indication for TIPS would be another variceal bleed. I told Theresa Rocha that pursuing a  TIPS right now for prevention of another variceal bleed could be potentially justified, but she shows no signs of bleeding after recent banding and I would hate for her to have a setback clinically after TIPS that might prevent or hamper treatment of her worsening HCC in the liver. I would recommend holding off on TIPS at this time until the [facility] is treated with at least one right lobe Y-90 treatment. She has a follow up appointment with Dr. Kathrine Cords in January  to discuss follow up EGD.  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 This could also be another indication for TIPS in the future, but I would like to manage the effusion with periodic thoracentesis and more aggressive diuretic therapy now while we address [place] in the liver. She felt that she tolerated Aldactone poorly in the past but is willing to try it again. I recommended she discuss diuretic management with [facility] during her next follow up hepatology appointment next week.  Electronically Signed: Azzie Roup, MD 11/11/2021, 5:00 PM    I spent a total of    15 Minutes in face to face in clinical consultation, greater than 50% of which was counseling/coordinating care for hepatocellular carcinoma and left pleural effusion.

## 2021-11-12 DIAGNOSIS — K921 Melena: Secondary | ICD-10-CM | POA: Diagnosis not present

## 2021-11-12 DIAGNOSIS — K920 Hematemesis: Secondary | ICD-10-CM | POA: Diagnosis not present

## 2021-11-12 DIAGNOSIS — K7460 Unspecified cirrhosis of liver: Secondary | ICD-10-CM | POA: Diagnosis not present

## 2021-11-12 DIAGNOSIS — C220 Liver cell carcinoma: Secondary | ICD-10-CM | POA: Diagnosis not present

## 2021-11-12 DIAGNOSIS — Z8616 Personal history of COVID-19: Secondary | ICD-10-CM | POA: Diagnosis not present

## 2021-11-12 DIAGNOSIS — I8511 Secondary esophageal varices with bleeding: Secondary | ICD-10-CM | POA: Diagnosis not present

## 2021-11-12 DIAGNOSIS — R350 Frequency of micturition: Secondary | ICD-10-CM | POA: Diagnosis not present

## 2021-11-12 NOTE — Telephone Encounter (Signed)
Papers given to [facility] who will call patient to start process

## 2021-11-12 NOTE — Telephone Encounter (Signed)
Pt came to the office and filled out forms. Forms given to [facility] to put on Dr. Blanch Media desk.

## 2021-11-12 NOTE — Telephone Encounter (Signed)
Spoke with pt, she will come to the office to fill out release form and pt intake form.

## 2021-11-17 DIAGNOSIS — E876 Hypokalemia: Secondary | ICD-10-CM | POA: Diagnosis not present

## 2021-11-18 ENCOUNTER — Other Ambulatory Visit: Payer: Self-pay | Admitting: Student

## 2021-11-18 ENCOUNTER — Other Ambulatory Visit: Payer: Self-pay | Admitting: Interventional Radiology

## 2021-11-18 DIAGNOSIS — C220 Liver cell carcinoma: Secondary | ICD-10-CM | POA: Diagnosis not present

## 2021-11-18 DIAGNOSIS — I8510 Secondary esophageal varices without bleeding: Secondary | ICD-10-CM | POA: Diagnosis not present

## 2021-11-18 DIAGNOSIS — J90 Pleural effusion, not elsewhere classified: Secondary | ICD-10-CM

## 2021-11-18 DIAGNOSIS — K7469 Other cirrhosis of liver: Secondary | ICD-10-CM | POA: Diagnosis not present

## 2021-11-19 ENCOUNTER — Ambulatory Visit (HOSPITAL_COMMUNITY)
Admission: RE | Admit: 2021-11-19 | Discharge: 2021-11-19 | Disposition: A | Discharge status: Home / Self Care | Payer: BC Managed Care – PPO | Source: Ambulatory Visit | Attending: Student | Admitting: Student

## 2021-11-19 ENCOUNTER — Other Ambulatory Visit: Payer: Self-pay

## 2021-11-19 ENCOUNTER — Ambulatory Visit (HOSPITAL_COMMUNITY)
Admission: RE | Admit: 2021-11-19 | Discharge: 2021-11-19 | Disposition: A | Discharge status: Home / Self Care | Payer: BC Managed Care – PPO | Source: Ambulatory Visit | Attending: Interventional Radiology | Admitting: Interventional Radiology

## 2021-11-19 DIAGNOSIS — J90 Pleural effusion, not elsewhere classified: Secondary | ICD-10-CM | POA: Diagnosis not present

## 2021-11-19 DIAGNOSIS — J948 Other specified pleural conditions: Secondary | ICD-10-CM | POA: Diagnosis not present

## 2021-11-19 DIAGNOSIS — Z9889 Other specified postprocedural states: Secondary | ICD-10-CM

## 2021-11-19 HISTORY — PX: IR THORACENTESIS ASP PLEURAL SPACE W/IMG GUIDE: IMG5380

## 2021-11-19 MED ORDER — LIDOCAINE HCL (PF) 1 % IJ SOLN
INTRAMUSCULAR | Status: DC | PRN
  Administered 2021-11-19: 5 mL

## 2021-11-19 MED ORDER — LIDOCAINE HCL 1 % IJ SOLN
INTRAMUSCULAR | Status: AC
  Filled 2021-11-19: qty 20

## 2021-11-19 NOTE — Procedures (Signed)
PROCEDURE SUMMARY:  Successful image-guided left thoracentesis. Yielded 1,450 cc of hazy yellow fluid. Pt tolerated procedure well. No immediate complications. EBL = trace   Specimen was sent for labs. CXR ordered.  Please see imaging section of Epic for full dictation.  Armando Gang Debrina Kizer PA-C 11/19/2021 10:19 AM

## 2021-11-20 LAB — CYTOLOGY - NON PAP

## 2021-11-24 DIAGNOSIS — Z0279 Encounter for issue of other medical certificate: Secondary | ICD-10-CM

## 2021-11-27 ENCOUNTER — Telehealth: Payer: Self-pay

## 2021-11-27 ENCOUNTER — Inpatient Hospital Stay: Payer: BC Managed Care – PPO | Admitting: Hematology

## 2021-11-27 ENCOUNTER — Other Ambulatory Visit: Payer: Self-pay

## 2021-11-27 DIAGNOSIS — J90 Pleural effusion, not elsewhere classified: Secondary | ICD-10-CM | POA: Diagnosis not present

## 2021-11-27 DIAGNOSIS — K7469 Other cirrhosis of liver: Secondary | ICD-10-CM | POA: Diagnosis not present

## 2021-11-27 NOTE — Telephone Encounter (Signed)
LVM for pt regarding phone visit appt today with [name].  Informed pt that [name] would like to reschedule her phone visit that is scheduled for 11/27/2021 to next week d/t scheduling conflict.  Awaiting return call from pt regarding appt today.

## 2021-11-30 NOTE — Progress Notes (Addendum)
[place]   [phone] [phone]   Clinic Follow up Note   Patient Care Team: Seward Carol, MD as PCP - General (Internal Medicine) Arna Snipe, RN as Oncology Nurse Navigator Irene Shipper, MD as Consulting Physician (Gastroenterology) Truitt Merle, MD as Consulting Physician (Hematology) Alla Feeling, NP as Nurse Practitioner (Nurse Practitioner) Stark Klein, MD as Consulting Physician (General Surgery) Aletta Edouard, MD as Consulting Physician (Interventional Radiology) Roosevelt Locks, CRNP as Nurse Practitioner (Nurse Practitioner) Mauro Kaufmann, RN as Oncology Nurse Navigator Rockwell Germany, RN as Oncology Nurse Navigator Eppie Gibson, MD as Attending Physician (Radiation Oncology) Trula Slade, DPM as Consulting Physician (Podiatry)  Date of Service:  12/01/2021  I connected with Theresa Rocha on 12/01/2021 at 11:00 AM EST by video enabled telemedicine visit and verified that I am speaking with the correct person using two identifiers.  I discussed the limitations, risks, security and privacy concerns of performing an evaluation and management service by telephone and the availability of in person appointments. I also discussed with the patient that there may be a patient responsible charge related to this service. The patient expressed understanding and agreed to proceed.   Other persons participating in the visit and their role in the encounter:  None   Patient's location:  car  Provider's location:  Home   CHIEF COMPLAINT: f/u of liver cancer, right breast cancer  CURRENT THERAPY:  -Anastrozole $RemoveBefor'1mg'jLUckRmGYtfk$  daily started in Nov 2020. Due to worsened knee pain/arthritis, she was switched to Tamoxifen in 03/2020.  -Zometa q91months starting in 06/2020, held before 3rd dose on 05/01/21, due to mouth sores.   ASSESSMENT & PLAN:  Theresa Rocha is a 56 y.o. female with   1. Multifocal Hepatocellular carcinoma, cT2N0Mx, stage II -presented  with abdominal pain and rectal bleeding in 06/2019. Work up scans showed multifocal lesions of liver consistent with [place] with indeterminate right lobe lesions. Her AFP was in normal range.  -she was found to not be a transplant candidate due to history of breast cancer and multifocal HCC beyond Milan criteria. She was also not felt to be a surgical candidate due to her liver function and location of tumors. She has been on surveillance. -she was initially not felt to be a good candidate for liver-targeted ablation. She was recently found to have enlarging left lobe liver lesions on surveillance CT in 06/2021. She was seen in follow up with Dr. Kathlene Cote, and they opted to proceed with ablation on 09/16/21. -post-treatment MRI 10/30/2021 and CT abdomen on 11/10/21 showed enlarging right liver lobe seg VII lesion measuring 4cm. She is being referred for Y90 of the right lobe. -She has developed a recurrent left pleural effusion, also cytology has been negative, and that [place] felt that this could be related to her ablation in October, I would like to make sure this is not malignant effusion.  I recommend a PET scan for further evaluation. -He may need repeated thoracentesis before PET scan to better evaluate her left lower lobe lung and pleural  -I also discussed the role of systemic treatment, given her recent rapid [place] progression.  Due to her recent GI bleeding from varices, and significant portal hypertension, she may not be a good candidate for bevacizumab. Single agent Nivo will be a reasonable option for her   2. Recurrent left Pleural Effusion -occurred following left lobe ablation on 09/16/21 -has been recurrent and persistent, "suspicious for iatrogenic hepatic hydrothorax with ascites preferentially flowing  into the left pleural space after the left lobe microwave ablation" per IR -most recent thoracentesis was on 11/19/21; cytology was negative.  3. Malignant neoplasm of central portion of  right breast, Stage II, c(T2N0M0), ER/PR+, HER2-, Grade II, RS 21 -diagnosed in 07/2019, s/p right lumpectomy with SNLB.  -Recurrence Score 21 (low risk)  -She completed adjuvant RT with Dr Isidore Moos 12/03/19-12/31/19 -She started anastrozole in 09/2019. Due to knee joint pain, she was switched to Tamoxifen in 03/2020. She is tolerating with hot flashes. Will continue to complete 10 years.  -most recent mammogram from 09/2020 was negative. -From a breast cancer standpoint, She is clinically doing well. She is, however, overdue for annual mammogram.   4. 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  -s/p harvoni and ribavirin treatment for Hep C per Dr. Bradd Burner in 2018 -EGD on 10/27/21 by Dr. Henrene Pastor while hospitalized showed esophageal varices, which were banded at that time. -Continue to F/u with Dr Henrene Pastor and Roosevelt Locks   5. Alcohol and smoking Cessation  -She has h/o smoking 0.5 PPD x35 years. She drank 3-4 cans beer nightly x20 years. -Cessation of both was discussed and strongly encouraged. She has stopped drinking alcohol but smokes 5 cigarettes a day still. I encouraged her to continue reducing until she quits completely.    6. Osteoporosis, Hypocalcemia  -DEXA from 09/21/19 showed osteoporosis T score -3.1 at [facility].   -I started her on Zometa q57months for 2 years on 07/17/20. Held before 3rd dose on 05/01/21, due to mouth sores. She has full sets of dentures and has not seen dentist in years. -I previously recommended her to start Calcium supplement daily  -She will repeat DEXA with Gyn office in late 2022.    7. Thrombocytopenia, [place] -secondary to liver cirrhosis, splenomegaly, and/or varices -I previously recommend she start oral iron.      PLAN:  -PET scan in 2-3 weeks, pt may need thoracentesis before PET scan -lab and f/u in a month    No problem-specific Assessment & Plan notes found for  this encounter.   SUMMARY OF ONCOLOGIC HISTORY: Oncology History Overview Note  Cancer Staging Hepatocellular carcinoma [facility]) Staging form: Liver, AJCC 8th Edition - Clinical stage from 08/02/2019: Stage II (cT2, cN0, cM0) - Signed by Truitt Merle, MD on 08/02/2019  Malignant neoplasm of central portion of right breast in female, estrogen receptor positive ([place]) Staging form: Breast, AJCC 8th Edition - Clinical stage from 08/29/2019: Stage IB (cT2, cN0, cM0, G2, ER+, PR+, HER2-) - Signed by Truitt Merle, MD on 09/05/2019    Hepatocellular carcinoma ([place])  07/04/2019 Imaging   CT AP IMPRESSION: 1. Findings of cirrhosis and hepatomegaly.  Patent portal vein. 2. Numerous varicosities as described above. 3. Moderate to large amount of abdominopelvic ascites 4. Contrast filled appendix which appears to be a mildly dilated with apparent wall thickening which could be due to surrounding ascites.   07/09/2019 Imaging   ABD US IMPRESSION: 1. Three small hypoechoic lesions in the RIGHT hepatic lobe measuring less than 1 cm but not evident on comparison contrast enhanced CT from 07/04/2019. Recommend MRI of the abdomen without and with contrast to evaluate these hepatic lesions in cirrhotic patient. 2. Increased liver echogenicity and lobular contour consistent cirrhosis. 3. Moderate mild to moderate volume of ascites. 4. Mild gallbladder wall thickening likely related to ascites. 5. No cholelithiasis or cholecystitis evident   07/26/2019 Imaging  MR ABD IMPRESSION: 1. In the lateral segment left hepatic lobe there are two LI-RADS category LR-5 lesions representing hepatocellular carcinoma. 2. There is also a LI-RADS category LR-3 lesion peripherally in the right hepatic lobe, intermediate probability for hepatocellular carcinoma. 3. Hepatic cirrhosis and diffuse wall thickening of the gallbladder. Widespread steatosis in the liver. 4. Portal venous hypertension with uphill paraesophageal  varices. 5.  Aortic Atherosclerosis (ICD10-I70.0). 6. Widespread ascites and mesenteric edema.   07/26/2019 Tumor Marker   AFP 5.5 (baseline)   08/02/2019 Initial Diagnosis   Hepatocellular carcinoma ([place])   08/02/2019 Cancer Staging   Staging form: Liver, AJCC 8th Edition - Clinical stage from 08/02/2019: Stage II (cT2, cN0, cM0) - Signed by Truitt Merle, MD on 08/02/2019    08/07/2019 Imaging   CT Chest 08/07/19 IMPRESSION: 1. Tiny bilateral pulmonary nodules measuring up to 6 mm. These are too small to definitively characterize. Given the history of HCC, close follow-up recommended to ensure stability. 2.  Emphysema. (ICD10-J43.9)   08/09/2019 Procedure   Upper Endoscopy by Dr Henrene Pastor 08/09/19  IMPRESSION 1. Small esophageal varices 2. Mild diffuse portal hypertensive gastropathy 3. Otherwise normal EGD.   02/04/2020 Imaging   MRI abdomen  IMPRESSION: Stable exam. The small segment II lesions identified previously as LI-RADS category 5 are stable in size.   Previously characterized tiny LI-RADS 3 lesion in the inferior right liver is stable.   No new suspicious focal hepatic lesion on a background liver morphology consistent with cirrhosis.   Interval decrease in ascites with decrease and diffuse gallbladder wall thickening.     06/18/2020 Imaging   MRI Abdomen  IMPRESSION: 1. Stable 1.1 x 0.9 cm rounded lesion in the medial left lobe of the liver, hepatic segment II, demonstrating arterial phase hyperenhancement without washout or capsule. This has previously been described as a LI-RADS category 5 lesion based on prior observations, if categorized by current imaging features this is consistent with LI-RADS category 4. 2. Stable 1.5 x 1.2 cm lesion lateral and inferior to this lesion in hepatic segment II with very subtle evidence of washout but without capsular enhancement. LI-RADS category 5. 3. Stable 5 mm focus of early arterial phase hyperenhancement of the inferior right  lobe of the liver, hepatic segment VI, without evidence of washout or capsule. This remains consistent with LI-RADS category 3. 4. No new suspicious lesions or contrast enhancement. 5. Cirrhotic morphology of the liver. 6. Trace perihepatic ascites.     10/30/2021 Imaging   EXAM: MRI ABDOMEN WITHOUT AND WITH CONTRAST  IMPRESSION: 1. Ablation defect of the left lobe of the liver which subtends two lesions previously noted in hepatic segment II. No evidence of residual contrast enhancement to suggest viable tumor.  2. Significant interval increase in size of a hyperenhancing lesion of the posterior liver dome, hepatic segment VII, measuring 4.0 x 3.7 cm, previously 2.6 x 2.5 cm when measured similarly. This demonstrates some heterogeneous contrast enhancement although no clear evidence of washout or capsular enhancement. Findings remain LI-RADS category 5, consistent with hepatocellular carcinoma. 3. Slight interval increase in size of a hyperenhancing lesion of the inferior tip of the right lobe of the liver, hepatic segment VI, measuring 1.1 x 0.7 cm, previously no greater than 0.6 cm, as well as an additional adjacent lesion measuring 0.6 cm, previously 0.4 cm. Given threshold growth, these lesions are upstaged to LI-RADS category 4 and suspicious for additional small foci of hepatocellular carcinoma. 4. Cirrhotic morphology of the liver and hepatic steatosis. 5.  Small volume ascites throughout the abdomen. 6. Large left, small right pleural effusions and associated atelectasis or consolidation. 7. Layering sludge in the gallbladder with mild gallbladder wall thickening and pericholecystic fluid, similar to prior examination although nonspecific in the setting of ascites. No discrete gallstones.   11/10/2021 Imaging   EXAM: CT ABDOMEN WITHOUT AND WITH CONTRAST  IMPRESSION: Post treatment changes about the LEFT hepatic lobe lesion with variable arterial enhancement not showing washout and  with non masslike features, favor perfusional defect, characterized as TR equivocal but favor nonviable.   Lesion in the posterior RIGHT hepatic lobe over time with enlargement and with serpiginous internal low signal on MR and low attenuation on CT. Findings categorized as LR M given signs of rim like enhancement on previous imaging and infiltrative appearance. Potentially infiltrative HCC but without specific imaging features of hepatocellular carcinoma by LI-RADS.   Enlarging lesion in the inferior RIGHT hepatic lobe greater than 50% diameter increase over 6 month interval, LI-RADS category 5 showing non rim arterial phase enhancement.   Additional foci of arterial phase enhancement without definitive washout, categorized as LI-RADS category 3 but in aggregate are more suspicious for small foci of hepatocellular carcinoma, attention on follow-up.   Malignant neoplasm of central portion of right breast in female, estrogen receptor positive ([place])  08/29/2019 Cancer Staging   Staging form: Breast, AJCC 8th Edition - Clinical stage from 08/29/2019: Stage IB (cT2, cN0, cM0, G2, ER+, PR+, HER2-) - Signed by Truitt Merle, MD on 09/05/2019    08/29/2019 Mammogram   Diagnostic mammogram 08/29/19 IMPRESSION: Suspicious mass in the 12 o'clock region of the right breast 3 cm from the nipple measuring 3.2 x 1.6 x 2.6 cm   08/29/2019 Initial Biopsy   Diagnosis 08/29/19 Breast, right, needle core biopsy, 12 o'clock - INVASIVE DUCTAL CARCINOMA, SEE COMMENT.   08/29/2019 Receptors her2   Results: IMMUNOHISTOCHEMICAL AND MORPHOMETRIC ANALYSIS PERFORMED MANUALLY The tumor cells are NEGATIVE for Her2 (1+). Estrogen Receptor: 100%, POSITIVE, STRONG STAINING INTENSITY Progesterone Receptor: 90%, POSITIVE, STRONG STAINING INTENSITY Proliferation Marker Ki67: 15%   09/05/2019 Initial Diagnosis   Malignant neoplasm of central portion of right breast in female, estrogen receptor positive ([place])   09/13/2019 Breast  MRI   MRI breast 09/13/19 IMPRESSION: 1. Irregular spiculated enhancing mass in the right breast representing the patient's known malignancy. No abnormal right axillary lymph nodes. 2. Prominent left internal mammary lymph node is indeterminate. No abnormal appearing right internal mammary lymph nodes. 3. No evidence of malignancy in the left breast.   10/16/2019 Cancer Staging   Staging form: Breast, AJCC 8th Edition - Pathologic stage from 10/16/2019: Stage IA (pT2, pN0, cM0, G2, ER+, PR+, HER2-, Oncotype DX score: 21) - Signed by Gardenia Phlegm, NP on 11/07/2019    10/16/2019 Oncotype testing   Oncotype 10/16/19 Recurrence score 21 with 7% benefit of Tamoxifen or AI. There is less than 1 % benefit of chemo.    10/16/2019 Surgery   RIGHT BREAST LUMPECTOMY WITH SENTINEL LYMPH NODE BIOPSY by Dr Barry Dienes 10/16/19    10/16/2019 Pathology Results   FINAL MICROSCOPIC DIAGNOSIS: 10/16/19  A. BREAST, RIGHT, LUMPECTOMY:  - Invasive ductal carcinoma, 2.3 cm, Nottingham grade 2 of 3.  - Margins of resection are not involved (Closest margin: 1 mm,  anterior).  - See oncology table.   B. SENTINEL LYMPH NODE, RIGHT AXILLARY #1, BIOPSY:  - One lymph node, negative for carcinoma (0/1).   C. SENTINEL LYMPH NODE, RIGHT AXILLARY #2, BIOPSY:  -  One lymph node, negative for carcinoma (0/1).    09/2019 -  Anti-estrogen oral therapy   Anastrozole $RemoveBefo'1mg'HocxjgPipef$  daily started in Nov 2020. Due to worsened knee pain/arthritis, she was switched to Tamoxifen in 03/2020.    12/03/2019 - 12/31/2019 Radiation Therapy   Adjuvant RT with Dr Isidore Moos 12/03/19-12/31/19   04/01/2020 Survivorship   SCP delivered by virtual visit per Cira Rue, NP       INTERVAL HISTORY:  Theresa Rocha was contacted for a follow up of liver and breast cancers. She was last seen by me on 05/01/21.  She underwent ablation for the liver cancer in left lobe in October 2022, and developed recurrent left pleural effusion which required  multiple thoracentesis.  She was hospitalized on October 26, 2021 for upper GI bleeding from varices, status post banding.  Due to the liver cancer progression on recent MRI, Dr. Kathlene Cote plan to offer Y 90 treatment.  She is overall stable, shortness of breath has improved after recent thoracentesis November 19, 2021.  She denies any significant abdominal pain, is on diuretics, mild to moderate fatigue and she is able to function at home.    All other systems were reviewed with the patient and are negative.  MEDICAL HISTORY:  Past Medical History:  Diagnosis Date   Anemia    Breast cancer ([place]) 08/30/2019   Cancer ([place])    liver- just diagnosed, not started treatment yet   Cervicalgia    COPD (chronic obstructive pulmonary disease) ([facility])    Esophageal varices ([facility])    GERD (gastroesophageal reflux disease)    Headache    Hepatic cirrhosis (H[facility])    Hepatitis C    resolved 2018   Hepatocellular carcinoma ([facility])    IBS (irritable bowel syndrome)    Lactose intolerance    Personal history of radiation therapy 11/2019   Right breast   Portal hypertension ([place])    Raynaud's syndrome    Thrombocytopenia ([place])     SURGICAL HISTORY: Past Surgical History:  Procedure Laterality Date   BREAST BIOPSY Right 09/2019   BREAST LUMPECTOMY Right 09/2019   BREAST LUMPECTOMY WITH AXILLARY LYMPH NODE BIOPSY Right 10/16/2019   Procedure: RIGHT BREAST LUMPECTOMY WITH SENTINEL LYMPH NODE BIOPSY;  Surgeon: Stark Klein, MD;  Location: [facility];  Service: General;  Laterality: Right;   COLONOSCOPY     ESOPHAGEAL BANDING  10/27/2021   Procedure: ESOPHAGEAL BANDING;  Surgeon: Irene Shipper, MD;  Location: [facility];  Service: Endoscopy;;   ESOPHAGOGASTRODUODENOSCOPY (EGD) WITH PROPOFOL N/A 10/27/2021   Procedure: ESOPHAGOGASTRODUODENOSCOPY (EGD) WITH PROPOFOL;  Surgeon: Irene Shipper, MD;  Location: [facility];  Service: Endoscopy;  Laterality: N/A;   HAMMER TOE SURGERY Left 2012   pins placed in  great toe, 2nd,3rd, 4th toes, all pins removed except great toe   IR PARACENTESIS  11/13/2019   IR RADIOLOGIST EVAL & MGMT  08/16/2019   IR RADIOLOGIST EVAL & MGMT  02/06/2020   IR RADIOLOGIST EVAL & MGMT  06/26/2020   IR RADIOLOGIST EVAL & MGMT  12/31/2020   IR RADIOLOGIST EVAL & MGMT  07/21/2021   IR RADIOLOGIST EVAL & MGMT  10/08/2021   IR THORACENTESIS ASP PLEURAL SPACE W/IMG GUIDE  10/12/2021   IR THORACENTESIS ASP PLEURAL SPACE W/IMG GUIDE  10/26/2021   IR THORACENTESIS ASP PLEURAL SPACE W/IMG GUIDE  11/19/2021   PELVIC LAPAROSCOPY Bilateral 1992   RADIOLOGY WITH ANESTHESIA N/A 09/16/2021   Procedure: CT MICROWAVE ABLATION;  Surgeon: Aletta Edouard, MD;  Location:  [facility];  Service: Radiology;  Laterality: N/A;   UPPER GASTROINTESTINAL ENDOSCOPY  2002   had esophageal dilitation    I have reviewed the social history and family history with the patient and they are unchanged from previous note.  ALLERGIES:  is allergic to hydromorphone hcl and aspirin.  MEDICATIONS:  Current Outpatient Medications  Medication Sig Dispense Refill   traZODone (DESYREL) 100 MG tablet Take 1 tablet (100 mg total) by mouth at bedtime as needed for sleep. 20 tablet 0   dicyclomine (BENTYL) 20 MG tablet Take 0.5 tablets (10 mg total) by mouth 3 (three) times daily before meals. Take one tablet every 6-8 hours as needed for abdominal cramping 90 tablet 0   eplerenone (INSPRA) 25 MG tablet Take 25 mg by mouth daily.     furosemide (LASIX) 40 MG tablet Take 1 tablet (40 mg total) by mouth daily. 30 tablet 0   ibuprofen (ADVIL) 200 MG tablet Take 400 mg by mouth every 6 (six) hours as needed for mild pain.     loperamide (IMODIUM) 2 MG capsule Take 4 mg by mouth every other day.     midodrine (PROAMATINE) 10 MG tablet Take 1 tablet (10 mg total) by mouth 3 (three) times daily with meals. 90 tablet 0   nicotine (NICODERM CQ - DOSED IN MG/24 HOURS) 21 mg/24hr patch Place 21 mg onto the skin daily as needed (smoking  cessation on work days).     ondansetron (ZOFRAN-ODT) 4 MG disintegrating tablet Dissolve one tablet on the tongue every 8 hours as needed for nausea and vomiting. 30 tablet 0   pantoprazole (PROTONIX) 40 MG tablet Take 1 tablet (40 mg total) by mouth daily. 90 tablet 1   potassium chloride (KLOR-CON) 10 MEQ tablet Take 30 mEq by mouth as needed. As directed     promethazine (PHENERGAN) 25 MG tablet Take 1 tablet (25 mg total) by mouth every 8 (eight) hours as needed for nausea or vomiting. (Patient not taking: Reported on 12/01/2021) 20 tablet 0   sucralfate (CARAFATE) 1 GM/10ML suspension Take 10 mLs (1 g total) by mouth 2 (two) times daily. 600 mL 0   tamoxifen (NOLVADEX) 20 MG tablet TAKE 1 TABLET BY MOUTH EVERY DAY (Patient taking differently: Take 20 mg by mouth every morning.) 90 tablet 1   No current facility-administered medications for this visit.    PHYSICAL EXAMINATION: ECOG PERFORMANCE STATUS: 2 - Symptomatic, <50% confined to bed  There were no vitals filed for this visit. Wt Readings from Last 3 Encounters:  12/01/21 100 lb (45.4 kg)  10/30/21 108 lb 3.9 oz (49.1 kg)  09/29/21 110 lb 0.2 oz (49.9 kg)     No vitals taken today, Exam not performed today  LABORATORY DATA:  I have reviewed the data as listed CBC Latest Ref Rng & Units 11/01/2021 10/31/2021 10/30/2021  WBC 4.0 - 10.5 K/uL 6.5 8.1 8.1  Hemoglobin 12.0 - 15.0 g/dL 9.2(L) 8.8(L) 10.0(L)  Hematocrit 36.0 - 46.0 % 29.2(L) 28.5(L) 31.7(L)  Platelets 150 - 400 K/uL 68(L) 70(L) 76(L)     CMP Latest Ref Rng & Units 11/01/2021 10/31/2021 10/30/2021  Glucose 70 - 99 mg/dL 118(H) 80 134(H)  BUN 6 - 20 mg/dL $Remove'10 10 8  'geTGDRM$ Creatinine 0.44 - 1.00 mg/dL 0.60 0.59 0.61  Sodium 135 - 145 mmol/L 133(L) 131(L) 132(L)  Potassium 3.5 - 5.1 mmol/L 3.8 3.2(L) 4.1  Chloride 98 - 111 mmol/L 104 100 102  CO2 22 - 32 mmol/L 26  25 24  Calcium 8.9 - 10.3 mg/dL 7.7(L) 7.2(L) 7.0(L)  Total Protein 6.5 - 8.1 g/dL 5.5(L) 5.2(L) 5.4(L)  Total  Bilirubin 0.3 - 1.2 mg/dL 1.0 1.4(H) 1.2  Alkaline Phos 38 - 126 U/L 43 49 58  AST 15 - 41 U/L 29 31 33  ALT 0 - 44 U/L $Remo'15 15 16      'hhrmr$ RADIOGRAPHIC STUDIES: I have personally reviewed the radiological images as listed and agreed with the findings in the report. No results found.    Orders Placed This Encounter  Procedures   NM PET Image Initial (PI) Skull Base To Thigh    Standing Status:   Future    Standing Expiration Date:   12/01/2022    Order Specific Question:   If indicated for the ordered procedure, I authorize the administration of a radiopharmaceutical per Radiology protocol    Answer:   Yes    Order Specific Question:   Is the patient pregnant?    Answer:   No    Order Specific Question:   Preferred imaging location?    Answer:   [facility]   All questions were answered. The patient knows to call the clinic with any problems, questions or concerns. No barriers to learning was detected. The total time spent in the appointment was 30 minutes.     Truitt Merle, MD 12/01/2021   I, Wilburn Mylar, am acting as scribe for Truitt Merle, MD.   I have reviewed the above documentation for accuracy and completeness, and I agree with the above.

## 2021-11-30 NOTE — Progress Notes (Addendum)
11/30/2021 Theresa Rocha [id number] June 16, 1966   Chief Complaint: Cirrhosis, hospital follow up  History of Present Illness: Theresa Rocha, Theresa Rocha is a 56 year old female with a past medical history of breast cancer s/p lumpectomy and radiation 2020, decompensated cirrhosis secondary to chronic hepatitis C and alcohol use disorder with esophageal varices and hepatocellular carcinoma s/p microwave ablation to the dominant hepatic lesion in the left lobe and she has a second 2.5 cm right liver lesion and several smaller hepatic lesions followed by IR and oncology. Recurrent left pleural effusion.   She was admitted to the hospital 10/26/2021 with an upper GI bleed with hematemesis and melena. Admission Hg was 8.3 (Hg 8.7 08/2021). She was placed on Octreotide, PPI infusion and she received 2 units of PRBCs and 1 unit of FFP. Post transfusion Hg 9.1. CTAP showed a moderate left pleural effusion, mild thickening of the walls of the entire colon and distal small bowel, prominent esophageal varices at the level of the lower esophagus incompletely imaged, cirrhotic liver, known neoplastic lesions in the liver, for centimeter lesion in the left lobe of the liver, additional hypodense lesion within the posterior right liver lobe measuring 2.5 cm compatible with another known neoplastic lesion likely enlarged compared to MRI from 07/17/2021, additional smaller lesions scattered within the liver.  She has recurrent left pleural effusion after ablation initially thought to be sympathetic effusion related to diaphragmatic irritation as her microwave ablation was just below the left hemidiaphragm. She required several thoracentesis with the most recent on 10/01/2021 (1.2 L pleural fluid removed), 11/14 (1.8L pleural fluid removed), 10/26/2021 (700cc pleural fluid removed), 11/30 (1.6L pleural fluid removed) and 11/19/2021 1L pleural fluid removed. Cytology showed reactive mesothelial cells present, no malignant cells. No  evidence of spontaneous bacterial pleuritis.  She is on Lasix 40 mg daily.  She is not on Aldactone which resulted in diarrhea and nausea taken in the past.  TIPS was discussed with the patient by Dr. Kathlene Cote, given its potential impact on treatment of her [place], no further bleeding after banding and improvement in pleural effusion decision was made to defer TIPS at this time.  She intends to return to her part-time employment in 1 week.  Her energy level is slowly improving.  She has lost 5 to 6 pounds over the past few weeks.  She took Imodium due to having her typical IBS diarrhea 2 days ago.  She developed mild nausea after taking Imodium without vomiting.  No BM in the past 2 days.  She stated this is her typical IBS pattern.  She remains on Pantoprazole 40 mg daily.  No heartburn or upper abdominal pain.  No melena or rectal bleeding.  No chest pain or shortness of breath.  EGD 10/27/2021 by Dr. Henrene Pastor: There were 3 columns of grade II varices were found in the lower third of the esophagus. One of the varix was slightly more prominent and had stigmata (red pigmentation/nipple). There was no active bleeding. After completing the entire endoscopic survey, Three bands were successfully placed on the high risk varix with incomplete eradication of varices overall. There was no bleeding during the procedure.  There was an incidental ringlike esophageal stricture at the gastroesophageal junction. There was mild inflammation in the pyloric region. The stomach was otherwise normal with no evidence of proximal gastric varices or significant portal hypertensive gastropathy. The examined duodenum was normal. The cardia and gastric fundus were normal on retroflexion save small hiatal hernia. 1. Recent upper  GI bleed secondary to esophageal varices status post band ligation 2. Incidental distal esophageal ring 3. Mild pyloric inflammation 4. Otherwise normal EGD.  Abdominal MRI w/wo contrast  10/29/2021: 1. Ablation defect of the left lobe of the liver which subtends two lesions previously noted in hepatic segment II. No evidence of residual contrast enhancement to suggest viable tumor. 2. Significant interval increase in size of a hyperenhancing lesion of the posterior liver dome, hepatic segment VII, measuring 4.0 x 3.7 cm, previously 2.6 x 2.5 cm when measured similarly. This demonstrates some heterogeneous contrast enhancement although no clear evidence of washout or capsular enhancement. Findings remain LI-RADS category 5, consistent with hepatocellular carcinoma. 3. Slight interval increase in size of a hyperenhancing lesion of the inferior tip of the right lobe of the liver, hepatic segment VI, measuring 1.1 x 0.7 cm, previously no greater than 0.6 cm, as well as an additional adjacent lesion measuring 0.6 cm, previously 0.4 cm. Given threshold growth, these lesions are upstaged to LI-RADS category 4 and suspicious for additional small foci of hepatocellular carcinoma. 4. Cirrhotic morphology of the liver and hepatic steatosis. 5. Small volume ascites throughout the abdomen. 6. Large left, small right pleural effusions and associated atelectasis or consolidation. 7. Layering sludge in the gallbladder with mild gallbladder wall thickening and pericholecystic fluid, similar to prior examination although nonspecific in the setting of ascites. No discrete gallstones.  CTAP w/wo contrast 11/10/2021: Post treatment changes about the LEFT hepatic lobe lesion with variable arterial enhancement not showing washout and with non masslike features, favor perfusional defect, characterized as TR equivocal but favor nonviable.   Lesion in the posterior RIGHT hepatic lobe over time with enlargement and with serpiginous internal low signal on MR and low attenuation on CT. Findings categorized as LR M given signs of rim like enhancement on previous imaging and infiltrative  appearance. Potentially infiltrative HCC but without specific imaging features of hepatocellular carcinoma by LI-RADS.   Enlarging lesion in the inferior RIGHT hepatic lobe greater than 50% diameter increase over 6 month interval, LI-RADS category 5 showing non rim arterial phase enhancement.   Additional foci of arterial phase enhancement without definitive washout, categorized as LI-RADS category 3 but in aggregate are more suspicious for small foci of hepatocellular carcinoma, attention on follow-up.  ECHO 10/29/2021: Left ventricular ejection fraction, by estimation, is 60 to 65%. The left ventricle has normal function. The left ventricle has no regional wall motion abnormalities. Left ventricular diastolic parameters were normal. 1. Right ventricular systolic function is normal. The right ventricular size is normal. Tricuspid regurgitation signal is inadequate for assessing PA pressure. 2. 3. Moderate pleural effusion in the left lateral region. The mitral valve is normal in structure. No evidence of mitral valve regurgitation. No evidence of mitral stenosis. 4. The aortic valve is tricuspid. Aortic valve regurgitation is not visualized. No aortic stenosis is present. 5. The inferior vena cava is normal in size with greater than 50% respiratory variability, suggesting right atrial pressure of 3 mmHg.  Current Outpatient Medications on File Prior to Visit  Medication Sig Dispense Refill   eplerenone (INSPRA) 25 MG tablet Take 25 mg by mouth daily.     furosemide (LASIX) 40 MG tablet Take 1 tablet (40 mg total) by mouth daily. 30 tablet 0   ibuprofen (ADVIL) 200 MG tablet Take 400 mg by mouth every 6 (six) hours as needed for mild pain.     loperamide (IMODIUM) 2 MG capsule Take 4 mg by mouth every other day.  midodrine (PROAMATINE) 10 MG tablet Take 1 tablet (10 mg total) by mouth 3 (three) times daily with meals. 90 tablet 0   nicotine (NICODERM CQ - DOSED IN MG/24  HOURS) 21 mg/24hr patch Place 21 mg onto the skin daily as needed (smoking cessation on work days).     potassium chloride (KLOR-CON) 10 MEQ tablet Take 30 mEq by mouth as needed. As directed     sucralfate (CARAFATE) 1 GM/10ML suspension Take 10 mLs (1 g total) by mouth 2 (two) times daily. 600 mL 0   tamoxifen (NOLVADEX) 20 MG tablet TAKE 1 TABLET BY MOUTH EVERY DAY (Patient taking differently: Take 20 mg by mouth every morning.) 90 tablet 1   promethazine (PHENERGAN) 25 MG tablet Take 1 tablet (25 mg total) by mouth every 8 (eight) hours as needed for nausea or vomiting. (Patient not taking: Reported on 12/01/2021) 20 tablet 0   No current facility-administered medications on file prior to visit.   Allergies  Allergen Reactions   Hydromorphone Hcl Nausea And Vomiting    Severe vomiting   Aspirin Nausea Only   Current Medications, Allergies, Past Medical History, Past Surgical History, Family History and Social History were reviewed in [employer] record.  Review of Systems:   Constitutional: See HPI Respiratory: See HPI. Cardiovascular: Negative for chest pain, palpitations and leg swelling.  Gastrointestinal: See HPI.  Musculoskeletal: Negative for back pain or muscle aches.  Neurological: Negative for dizziness, headaches or paresthesias.   Physical Exam: LMP 04/20/2013  General: 56 year old female in no acute distress. Head: Normocephalic and atraumatic. Eyes: No scleral icterus. Conjunctiva pink . Ears: Normal auditory acuity. Mouth: Dentition intact. No ulcers or lesions.  Lungs: Clear throughout to auscultation. Heart: Regular rate and rhythm, no murmur. Abdomen: Soft, nontender and nondistended.  No ascites.  No masses or hepatomegaly. Normal bowel sounds x 4 quadrants.  Rectal: Deferred. Musculoskeletal: Symmetrical with no gross deformities. Extremities: No edema. Neurological: Alert oriented x 4. No focal deficits.  No asterixis. Psychological:  Alert and cooperative. Normal mood and affect  Assessment and Recommendations:  29) 56 year old female with decompensated cirrhosis secondary to hepatitis C (s/p treatment with SVR) and alcohol use disorder) with esophageal varices, portal hypertensive gastropathy and progressive multifocal HCC s/p microwave ablation10/2022. MELD 11. She is not a transplant candidate due to history of invasive breast cancer and multifocal HCC. -Continue follow up with Roosevelt Locks NP at [place] follow up with Dr. Kathlene Cote, planning on Y-90 radioembolization to the multifocal [facility] in the right liver lobe. Holding off on TIPS until [facility] is treated. -CBC, CMP and INR -2 g low-sodium diet -Recommended protein 1.2 to 1.5 gm/kg daily, consider nutritionist consult   2) Anemia. Admitted to the hospital 09/2021 with UGI bleed/melena/hematemesis. Admission Hg was 8.3 (Hg 8.7 08/2021). She was placed on Octreotide, PPI infusion and she received 2 units of PRBCs and 1 unit of FFP. Post transfusion Hg 9.1. S/P EGD 10/28/2021 showed 3 columns of grade II varices were found in the lower third of the esophagus, 3 bands were successfully place without further GI bleeding -CBC, iron panel, PT/INR. CMP -Repeat EGD with band ligation at [facility], EGD benefits and risks discussed including risk with sedation, risk of bleeding, perforation and infection  -Consider TIPS if variceal bleed recurs (IR recommended holding off on TIPS until [facility] treated) -Patient to contact her office if she develops hematemesis black stool or rectal bleed -Continue Pantoprazole 40  mg daily -Ondansetron 4 mg ODT every 6 hours as needed  3) Thrombocytopenia secondary to cirrhosis/mild splenomegaly   4) Recurrent left pleural effusion, query hepatic hydrothorax. No plans for TIPS at this time.  -Continue Furosemide 40mg  daily. Started on Eplerenone 25mg  QD per Roosevelt Locks NP. -Beta blockers not recommended  -Check chest  xray pa/lat 2 days prior to EGD date to assess status of left pleural effusion  5) Covid 19+ 10/30/2021 during her  hospital admission

## 2021-12-01 ENCOUNTER — Other Ambulatory Visit (INDEPENDENT_AMBULATORY_CARE_PROVIDER_SITE_OTHER): Payer: BC Managed Care – PPO

## 2021-12-01 ENCOUNTER — Inpatient Hospital Stay: Payer: BC Managed Care – PPO | Attending: Hematology | Admitting: Hematology

## 2021-12-01 ENCOUNTER — Encounter: Payer: Self-pay | Admitting: Nurse Practitioner

## 2021-12-01 ENCOUNTER — Encounter: Payer: Self-pay | Admitting: Hematology

## 2021-12-01 ENCOUNTER — Ambulatory Visit: Payer: BC Managed Care – PPO | Admitting: Nurse Practitioner

## 2021-12-01 VITALS — BP 88/60 | HR 94 | Ht 63.0 in | Wt 100.0 lb

## 2021-12-01 DIAGNOSIS — I8510 Secondary esophageal varices without bleeding: Secondary | ICD-10-CM

## 2021-12-01 DIAGNOSIS — R161 Splenomegaly, not elsewhere classified: Secondary | ICD-10-CM

## 2021-12-01 DIAGNOSIS — Z8616 Personal history of COVID-19: Secondary | ICD-10-CM

## 2021-12-01 DIAGNOSIS — C220 Liver cell carcinoma: Secondary | ICD-10-CM | POA: Diagnosis not present

## 2021-12-01 DIAGNOSIS — D696 Thrombocytopenia, unspecified: Secondary | ICD-10-CM

## 2021-12-01 DIAGNOSIS — K3189 Other diseases of stomach and duodenum: Secondary | ICD-10-CM

## 2021-12-01 DIAGNOSIS — R197 Diarrhea, unspecified: Secondary | ICD-10-CM

## 2021-12-01 DIAGNOSIS — F1099 Alcohol use, unspecified with unspecified alcohol-induced disorder: Secondary | ICD-10-CM

## 2021-12-01 DIAGNOSIS — J90 Pleural effusion, not elsewhere classified: Secondary | ICD-10-CM

## 2021-12-01 DIAGNOSIS — K7460 Unspecified cirrhosis of liver: Secondary | ICD-10-CM

## 2021-12-01 DIAGNOSIS — I8511 Secondary esophageal varices with bleeding: Secondary | ICD-10-CM | POA: Diagnosis not present

## 2021-12-01 DIAGNOSIS — D649 Anemia, unspecified: Secondary | ICD-10-CM | POA: Diagnosis not present

## 2021-12-01 DIAGNOSIS — B182 Chronic viral hepatitis C: Secondary | ICD-10-CM

## 2021-12-01 LAB — CBC WITH DIFFERENTIAL/PLATELET
Basophils Absolute: 0 10*3/uL (ref 0.0–0.1)
Basophils Relative: 0.7 % (ref 0.0–3.0)
Eosinophils Absolute: 0.1 10*3/uL (ref 0.0–0.7)
Eosinophils Relative: 1 % (ref 0.0–5.0)
HCT: 32.8 % — ABNORMAL LOW (ref 36.0–46.0)
Hemoglobin: 10.4 g/dL — ABNORMAL LOW (ref 12.0–15.0)
Lymphocytes Relative: 19.7 % (ref 12.0–46.0)
Lymphs Abs: 1.1 10*3/uL (ref 0.7–4.0)
MCHC: 31.7 g/dL (ref 30.0–36.0)
MCV: 78.6 fl (ref 78.0–100.0)
Monocytes Absolute: 0.5 10*3/uL (ref 0.1–1.0)
Monocytes Relative: 9.6 % (ref 3.0–12.0)
Neutro Abs: 3.7 10*3/uL (ref 1.4–7.7)
Neutrophils Relative %: 69 % (ref 43.0–77.0)
Platelets: 93 10*3/uL — ABNORMAL LOW (ref 150.0–400.0)
RBC: 4.18 Mil/uL (ref 3.87–5.11)
RDW: 21.2 % — ABNORMAL HIGH (ref 11.5–15.5)
WBC: 5.4 10*3/uL (ref 4.0–10.5)

## 2021-12-01 LAB — COMPREHENSIVE METABOLIC PANEL
ALT: 13 U/L (ref 0–35)
AST: 29 U/L (ref 0–37)
Albumin: 3.2 g/dL — ABNORMAL LOW (ref 3.5–5.2)
Alkaline Phosphatase: 83 U/L (ref 39–117)
BUN: 10 mg/dL (ref 6–23)
CO2: 28 mEq/L (ref 19–32)
Calcium: 8.2 mg/dL — ABNORMAL LOW (ref 8.4–10.5)
Chloride: 101 mEq/L (ref 96–112)
Creatinine, Ser: 0.65 mg/dL (ref 0.40–1.20)
GFR: 99.67 mL/min (ref >60.00)
Glucose, Bld: 103 mg/dL — ABNORMAL HIGH (ref 70–99)
Potassium: 2.9 mEq/L — ABNORMAL LOW (ref 3.5–5.1)
Sodium: 138 mEq/L (ref 135–145)
Total Bilirubin: 1.3 mg/dL — ABNORMAL HIGH (ref 0.2–1.2)
Total Protein: 6.7 g/dL (ref 6.0–8.3)

## 2021-12-01 LAB — PROTIME-INR
INR: 1.3 — ABNORMAL HIGH
Prothrombin Time: 13.2 s — ABNORMAL HIGH (ref 9.0–11.5)

## 2021-12-01 MED ORDER — ONDANSETRON 4 MG PO TBDP: ORAL_TABLET | ORAL | 0 refills | Status: DC

## 2021-12-01 MED ORDER — PANTOPRAZOLE SODIUM 40 MG PO TBEC: 40.0000 mg | DELAYED_RELEASE_TABLET | Freq: Every day | ORAL | 1 refills | Status: DC

## 2021-12-01 MED ORDER — DICYCLOMINE HCL 20 MG PO TABS: 10.0000 mg | ORAL_TABLET | Freq: Three times a day (TID) | ORAL | 0 refills | Status: DC

## 2021-12-01 MED ORDER — TRAZODONE HCL 100 MG PO TABS: 100.0000 mg | ORAL_TABLET | Freq: Every evening | ORAL | 0 refills | Status: DC | PRN

## 2021-12-01 NOTE — Patient Instructions (Signed)
PROCEDURES: You have been scheduled for a EGD. Please follow the written instructions given to you at your visit today. If you use inhalers (even only as needed), please bring them with you on the day of your procedure.  IMAGING Your provider has requested that you have an chest x ray before leaving today. Please go to the basement floor to our Radiology department for the test.  LABS:  Lab work has been ordered for you today. Our lab is located in the basement. Inclusing a stool test. Do the stool test on a diarrhea stool specimen. Press "B" on the elevator. The lab is located at the first door on the left as you exit the elevator.  HEALTHCARE LAWS AND MY CHART RESULTS: Due to recent changes in healthcare laws, you may see the results of your imaging and laboratory studies on MyChart before your provider has had a chance to review them.   We understand that in some cases there may be results that are confusing or concerning to you. Not all laboratory results come back in the same time frame and the provider may be waiting for multiple results in order to interpret others.  Please give [place] 48 hours in order for your provider to thoroughly review all the results before contacting the office for clarification of your results.   MEDICATION: We have sent the following medication to your pharmacy for you to pick up at your convenience: Dicyclomine 10 MG 3 times a day with meals. Zofran as needed for nausea.  It was great seeing you today! Thank you for entrusting me with your care and choosing [facility].  Noralyn Pick, CRNP  The [facility] providers would like to encourage you to use [facility] to communicate with providers for non-urgent requests or questions.  Due to long hold times on the telephone, sending your provider a message by [facility] may be faster and more efficient way to get a response. Please allow 48 business hours for a response.  Please remember that this is  for non-urgent requests/questions.  If you are age 8 or older, your body mass index should be between 23-30. Your Body mass index is 17.71 kg/m. If this is out of the aforementioned range listed, please consider follow up with your Primary Care Provider.  If you are age 9 or younger, your body mass index should be between 19-25. Your Body mass index is 17.71 kg/m. If this is out of the aformentioned range listed, please consider follow up with your Primary Care Provider.

## 2021-12-01 NOTE — Progress Notes (Signed)
Noted  

## 2021-12-02 ENCOUNTER — Other Ambulatory Visit: Payer: Self-pay

## 2021-12-02 DIAGNOSIS — E876 Hypokalemia: Secondary | ICD-10-CM

## 2021-12-02 MED ORDER — POTASSIUM CHLORIDE CRYS ER 20 MEQ PO TBCR: EXTENDED_RELEASE_TABLET | ORAL | 0 refills | Status: DC

## 2021-12-02 MED ORDER — POTASSIUM CHLORIDE CRYS ER 20 MEQ PO TBCR: 20.0000 meq | EXTENDED_RELEASE_TABLET | Freq: Every day | ORAL | 0 refills | Status: DC

## 2021-12-02 NOTE — Progress Notes (Signed)
Sent message to [place] for prior authorization for PET Scan.  Once PA is obtain will schedule pt for PET prior to 12/15/2021.

## 2021-12-03 ENCOUNTER — Telehealth: Payer: Self-pay | Admitting: Hematology

## 2021-12-03 NOTE — Telephone Encounter (Signed)
Left message with follow-up appointment per 1/3 los.

## 2021-12-04 ENCOUNTER — Other Ambulatory Visit: Payer: Self-pay

## 2021-12-07 ENCOUNTER — Encounter (HOSPITAL_COMMUNITY): Payer: Self-pay

## 2021-12-07 ENCOUNTER — Ambulatory Visit (HOSPITAL_COMMUNITY): Admit: 2021-12-07 | Payer: BC Managed Care – PPO | Admitting: Internal Medicine

## 2021-12-07 ENCOUNTER — Other Ambulatory Visit: Payer: Self-pay

## 2021-12-07 ENCOUNTER — Other Ambulatory Visit (HOSPITAL_COMMUNITY): Payer: Self-pay | Admitting: Interventional Radiology

## 2021-12-07 DIAGNOSIS — C220 Liver cell carcinoma: Secondary | ICD-10-CM

## 2021-12-07 SURGERY — ESOPHAGOGASTRODUODENOSCOPY (EGD) WITH PROPOFOL
Anesthesia: Monitor Anesthesia Care

## 2021-12-07 NOTE — Progress Notes (Signed)
Spoke with pt via telephone regarding CT Scan appt.  Pt is scheduled at [facility] on 12/15/2021 @8 :30am.  Pt is to be NPO for 6hrs prior to CT but may have sips of water for meds.  Pt will stop by [facility] to pick-up contrast prior to CT Scan date and will drink contract as directed.  Pt verbalized understanding of instructions.  Pt denied being claustrophobic.  Pt had no further questions or concerns at this time.

## 2021-12-08 ENCOUNTER — Other Ambulatory Visit (INDEPENDENT_AMBULATORY_CARE_PROVIDER_SITE_OTHER): Payer: BC Managed Care – PPO

## 2021-12-08 DIAGNOSIS — E876 Hypokalemia: Secondary | ICD-10-CM | POA: Diagnosis not present

## 2021-12-08 LAB — BASIC METABOLIC PANEL
BUN: 7 mg/dL (ref 6–23)
CO2: 27 mEq/L (ref 19–32)
Calcium: 8.2 mg/dL — ABNORMAL LOW (ref 8.4–10.5)
Chloride: 101 mEq/L (ref 96–112)
Creatinine, Ser: 0.63 mg/dL (ref 0.40–1.20)
GFR: 100.41 mL/min (ref >60.00)
Glucose, Bld: 200 mg/dL — ABNORMAL HIGH (ref 70–99)
Potassium: 3.1 mEq/L — ABNORMAL LOW (ref 3.5–5.1)
Sodium: 135 mEq/L (ref 135–145)

## 2021-12-09 ENCOUNTER — Telehealth: Payer: Self-pay | Admitting: *Deleted

## 2021-12-09 NOTE — Telephone Encounter (Signed)
Per Dr.Feng, pt has been scheduled for labs and in-person f/u with Dr.Feng. Pt is aware of 2/1 appt's starting at 12:45 pm

## 2021-12-10 ENCOUNTER — Other Ambulatory Visit: Payer: Self-pay

## 2021-12-10 DIAGNOSIS — C220 Liver cell carcinoma: Secondary | ICD-10-CM

## 2021-12-14 ENCOUNTER — Ambulatory Visit (HOSPITAL_COMMUNITY)
Admission: RE | Admit: 2021-12-14 | Discharge: 2021-12-14 | Disposition: A | Discharge status: Home / Self Care | Payer: BC Managed Care – PPO | Source: Ambulatory Visit | Attending: Hematology | Admitting: Hematology

## 2021-12-14 ENCOUNTER — Ambulatory Visit (HOSPITAL_COMMUNITY)
Admission: RE | Admit: 2021-12-14 | Discharge: 2021-12-14 | Disposition: A | Discharge status: Home / Self Care | Payer: BC Managed Care – PPO | Source: Ambulatory Visit | Attending: Physician Assistant | Admitting: Physician Assistant

## 2021-12-14 ENCOUNTER — Other Ambulatory Visit: Payer: Self-pay | Admitting: Radiology

## 2021-12-14 ENCOUNTER — Other Ambulatory Visit: Payer: Self-pay

## 2021-12-14 DIAGNOSIS — J439 Emphysema, unspecified: Secondary | ICD-10-CM | POA: Diagnosis not present

## 2021-12-14 DIAGNOSIS — Z9889 Other specified postprocedural states: Secondary | ICD-10-CM

## 2021-12-14 DIAGNOSIS — C220 Liver cell carcinoma: Secondary | ICD-10-CM | POA: Diagnosis not present

## 2021-12-14 DIAGNOSIS — J90 Pleural effusion, not elsewhere classified: Secondary | ICD-10-CM | POA: Insufficient documentation

## 2021-12-14 MED ORDER — LIDOCAINE HCL 1 % IJ SOLN
INTRAMUSCULAR | Status: AC
  Administered 2021-12-14: 10 mL
  Filled 2021-12-14: qty 20

## 2021-12-14 NOTE — Addendum Note (Signed)
Addended by: Virgina Evener A on: 12/14/2021 02:58 PM   Modules accepted: Orders

## 2021-12-14 NOTE — Procedures (Signed)
PROCEDURE SUMMARY:  Successful US guided left thoracentesis. Yielded 900cc of yellow pleural fluid. Pt tolerated procedure well. No immediate complications.  Specimen not sent for labs. CXR ordered.  EBL < 5 mL  Kadeisha Betsch PA-C 12/14/2021 3:04 PM

## 2021-12-15 ENCOUNTER — Ambulatory Visit (HOSPITAL_COMMUNITY)
Admission: RE | Admit: 2021-12-15 | Discharge: 2021-12-15 | Disposition: A | Discharge status: Home / Self Care | Payer: BC Managed Care – PPO | Source: Ambulatory Visit | Attending: Hematology | Admitting: Hematology

## 2021-12-15 DIAGNOSIS — C220 Liver cell carcinoma: Secondary | ICD-10-CM | POA: Diagnosis not present

## 2021-12-15 DIAGNOSIS — J90 Pleural effusion, not elsewhere classified: Secondary | ICD-10-CM | POA: Diagnosis not present

## 2021-12-15 DIAGNOSIS — K769 Liver disease, unspecified: Secondary | ICD-10-CM | POA: Diagnosis not present

## 2021-12-15 DIAGNOSIS — R160 Hepatomegaly, not elsewhere classified: Secondary | ICD-10-CM | POA: Diagnosis not present

## 2021-12-15 LAB — GLUCOSE, CAPILLARY: Glucose-Capillary: 111 mg/dL — ABNORMAL HIGH (ref 70–99)

## 2021-12-15 MED ORDER — FLUDEOXYGLUCOSE F - 18 (FDG) INJECTION
5.0000 | Freq: Once | INTRAVENOUS | Status: AC
  Administered 2021-12-15: 4.95 via INTRAVENOUS

## 2021-12-16 ENCOUNTER — Ambulatory Visit (HOSPITAL_COMMUNITY)
Admission: RE | Admit: 2021-12-16 | Discharge: 2021-12-16 | Disposition: A | Discharge status: Home / Self Care | Payer: BC Managed Care – PPO | Source: Ambulatory Visit | Attending: Interventional Radiology | Admitting: Interventional Radiology

## 2021-12-16 ENCOUNTER — Other Ambulatory Visit: Payer: Self-pay

## 2021-12-16 ENCOUNTER — Encounter (HOSPITAL_COMMUNITY): Payer: Self-pay

## 2021-12-16 ENCOUNTER — Other Ambulatory Visit (HOSPITAL_COMMUNITY): Payer: Self-pay | Admitting: Interventional Radiology

## 2021-12-16 DIAGNOSIS — K7460 Unspecified cirrhosis of liver: Secondary | ICD-10-CM | POA: Insufficient documentation

## 2021-12-16 DIAGNOSIS — J90 Pleural effusion, not elsewhere classified: Secondary | ICD-10-CM | POA: Insufficient documentation

## 2021-12-16 DIAGNOSIS — C220 Liver cell carcinoma: Secondary | ICD-10-CM

## 2021-12-16 DIAGNOSIS — K589 Irritable bowel syndrome without diarrhea: Secondary | ICD-10-CM | POA: Diagnosis not present

## 2021-12-16 DIAGNOSIS — Z853 Personal history of malignant neoplasm of breast: Secondary | ICD-10-CM | POA: Diagnosis not present

## 2021-12-16 DIAGNOSIS — I7300 Raynaud's syndrome without gangrene: Secondary | ICD-10-CM | POA: Diagnosis not present

## 2021-12-16 DIAGNOSIS — J449 Chronic obstructive pulmonary disease, unspecified: Secondary | ICD-10-CM | POA: Diagnosis not present

## 2021-12-16 DIAGNOSIS — K219 Gastro-esophageal reflux disease without esophagitis: Secondary | ICD-10-CM | POA: Insufficient documentation

## 2021-12-16 DIAGNOSIS — B182 Chronic viral hepatitis C: Secondary | ICD-10-CM | POA: Insufficient documentation

## 2021-12-16 DIAGNOSIS — J910 Malignant pleural effusion: Secondary | ICD-10-CM | POA: Diagnosis not present

## 2021-12-16 DIAGNOSIS — K766 Portal hypertension: Secondary | ICD-10-CM | POA: Insufficient documentation

## 2021-12-16 DIAGNOSIS — Z8719 Personal history of other diseases of the digestive system: Secondary | ICD-10-CM | POA: Diagnosis not present

## 2021-12-16 HISTORY — PX: IR US GUIDE VASC ACCESS RIGHT: IMG2390

## 2021-12-16 HISTORY — PX: IR ANGIOGRAM SELECTIVE EACH ADDITIONAL VESSEL: IMG667

## 2021-12-16 HISTORY — PX: IR ANGIOGRAM VISCERAL SELECTIVE: IMG657

## 2021-12-16 LAB — CBC WITH DIFFERENTIAL/PLATELET
Abs Immature Granulocytes: 0.01 10*3/uL (ref 0.00–0.07)
Basophils Absolute: 0 10*3/uL (ref 0.0–0.1)
Basophils Relative: 0 %
Eosinophils Absolute: 0.2 10*3/uL (ref 0.0–0.5)
Eosinophils Relative: 3 %
HCT: 33.2 % — ABNORMAL LOW (ref 36.0–46.0)
Hemoglobin: 10.2 g/dL — ABNORMAL LOW (ref 12.0–15.0)
Immature Granulocytes: 0 %
Lymphocytes Relative: 20 %
Lymphs Abs: 1 10*3/uL (ref 0.7–4.0)
MCH: 24.8 pg — ABNORMAL LOW (ref 26.0–34.0)
MCHC: 30.7 g/dL (ref 30.0–36.0)
MCV: 80.8 fL (ref 80.0–100.0)
Monocytes Absolute: 0.5 10*3/uL (ref 0.1–1.0)
Monocytes Relative: 9 %
Neutro Abs: 3.5 10*3/uL (ref 1.7–7.7)
Neutrophils Relative %: 68 %
Platelets: 77 10*3/uL — ABNORMAL LOW (ref 150–400)
RBC: 4.11 MIL/uL (ref 3.87–5.11)
RDW: 19.9 % — ABNORMAL HIGH (ref 11.5–15.5)
WBC: 5.1 10*3/uL (ref 4.0–10.5)
nRBC: 0 % (ref 0.0–0.2)

## 2021-12-16 LAB — PROTIME-INR
INR: 1.4 — ABNORMAL HIGH (ref 0.8–1.2)
Prothrombin Time: 16.7 seconds — ABNORMAL HIGH (ref 11.4–15.2)

## 2021-12-16 LAB — COMPREHENSIVE METABOLIC PANEL
ALT: 17 U/L (ref 0–44)
AST: 35 U/L (ref 15–41)
Albumin: 2.9 g/dL — ABNORMAL LOW (ref 3.5–5.0)
Alkaline Phosphatase: 69 U/L (ref 38–126)
Anion gap: 7 (ref 5–15)
BUN: 5 mg/dL — ABNORMAL LOW (ref 6–20)
CO2: 23 mmol/L (ref 22–32)
Calcium: 8 mg/dL — ABNORMAL LOW (ref 8.9–10.3)
Chloride: 106 mmol/L (ref 98–111)
Creatinine, Ser: 0.58 mg/dL (ref 0.44–1.00)
GFR, Estimated: >60 mL/min (ref >60)
Glucose, Bld: 103 mg/dL — ABNORMAL HIGH (ref 70–99)
Potassium: 3.7 mmol/L (ref 3.5–5.1)
Sodium: 136 mmol/L (ref 135–145)
Total Bilirubin: 1 mg/dL (ref 0.3–1.2)
Total Protein: 6.4 g/dL — ABNORMAL LOW (ref 6.5–8.1)

## 2021-12-16 MED ORDER — FENTANYL CITRATE (PF) 100 MCG/2ML IJ SOLN
INTRAMUSCULAR | Status: AC | PRN
  Administered 2021-12-16 (×2): 50 ug via INTRAVENOUS

## 2021-12-16 MED ORDER — SODIUM CHLORIDE 0.9 % IV SOLN: INTRAVENOUS | Status: DC

## 2021-12-16 MED ORDER — IOHEXOL 300 MG/ML  SOLN
50.0000 mL | Freq: Once | INTRAMUSCULAR | Status: AC | PRN
  Administered 2021-12-16: 10 mL via INTRA_ARTERIAL

## 2021-12-16 MED ORDER — MIDAZOLAM HCL 2 MG/2ML IJ SOLN
INTRAMUSCULAR | Status: AC
  Filled 2021-12-16: qty 4

## 2021-12-16 MED ORDER — IOHEXOL 300 MG/ML  SOLN
100.0000 mL | Freq: Once | INTRAMUSCULAR | Status: AC | PRN
  Administered 2021-12-16: 20 mL via INTRA_ARTERIAL

## 2021-12-16 MED ORDER — IOHEXOL 300 MG/ML  SOLN
100.0000 mL | Freq: Once | INTRAMUSCULAR | Status: AC | PRN
  Administered 2021-12-16: 30 mL via INTRA_ARTERIAL

## 2021-12-16 MED ORDER — MIDAZOLAM HCL 2 MG/2ML IJ SOLN
INTRAMUSCULAR | Status: AC | PRN
  Administered 2021-12-16 (×4): 1 mg via INTRAVENOUS

## 2021-12-16 MED ORDER — LIDOCAINE HCL 1 % IJ SOLN
INTRAMUSCULAR | Status: AC
  Filled 2021-12-16: qty 20

## 2021-12-16 MED ORDER — ONDANSETRON HCL 4 MG/2ML IJ SOLN
INTRAMUSCULAR | Status: AC
  Filled 2021-12-16: qty 2

## 2021-12-16 MED ORDER — TECHNETIUM TO 99M ALBUMIN AGGREGATED
4.3000 | Freq: Once | INTRAVENOUS | Status: AC
  Administered 2021-12-16: 4.3 via INTRAVENOUS

## 2021-12-16 MED ORDER — FENTANYL CITRATE (PF) 100 MCG/2ML IJ SOLN
INTRAMUSCULAR | Status: AC
  Filled 2021-12-16: qty 4

## 2021-12-16 NOTE — H&P (Signed)
Referring Physician(s): Drazek,D/Feng,Y  Supervising Physician: Aletta Edouard  Patient Status:  [facility]  Chief Complaint:  Multifocal hepatocellular carcinoma  Subjective: Patient known to IR service from multiple thoracenteses as well as thermal ablation of a 1.8 cm left lobe [place] in 2022.  She has a history of chronic hepatitis C ,cirrhosis, esophageal varices with recent bleeding requiring transfusion in late November/early December 2022,  recurrent left pleural effusion after ablation procedure, prior breast cancer, COPD, GERD, IBS, portal hypertension, Raynaud's syndrome, thrombocytopenia. Current imaging suggests an enlarging malignant lesion in the posterior aspect of segment VII of the right lobe now measuring up to approximately 3.6 x 4 cm. Enhancement characteristics by MR and CT are now consistent with hepatocellular carcinoma. There is also an enlarging nodular lesion in the inferior tip of the right lobe in segment VI measuring 1 x 1.2 cm and consistent with HCC. Other small foci of arterial hyperenhancement also visible in segment VI. Enhancement along superior aspect of the left lobe ablation defect by CT is favored to be a perfusion defect and not recurrent malignancy, which is also supported by recent MRI.It now appears that she has multifocal [place] in the right lobe of the liver and the best treatment would be directed Y-90 radioembolization.  She presents today for pre-Y 90 arteriography with embolization and test Y 90 dosing.  PET scan yesterday revealed:  1. Intensely hypermetabolic mass in the posterior RIGHT hepatic lobe consistent with known hepatocellular carcinoma. Smaller lesions identified on MRI are not hypermetabolic. 2. No evidence of metastatic lymphadenopathy or distant metastatic disease. 3. Elongated nodular thickening in the lingula is favored benign. Moderate LEFT pleural effusion  She currently denies fever, headache, chest pain, worsening dyspnea,  abdominal pain, back pain, nausea, vomiting or bleeding.  She does have occasional cough.   Past Medical History:  Diagnosis Date   Anemia    Breast cancer ([place]) 08/30/2019   Cancer ([place])    liver- just diagnosed, not started treatment yet   Cervicalgia    COPD (chronic obstructive pulmonary disease) (HCC)    Esophageal varices (HCC)    GERD (gastroesophageal reflux disease)    Headache    Hepatic cirrhosis (HCC)    Hepatitis C    resolved 2018   Hepatocellular carcinoma (HCC)    IBS (irritable bowel syndrome)    Lactose intolerance    Personal history of radiation therapy 11/2019   Right breast   Portal hypertension ([place])    Raynaud's syndrome    Thrombocytopenia ([place])    Past Surgical History:  Procedure Laterality Date   BREAST BIOPSY Right 09/2019   BREAST LUMPECTOMY Right 09/2019   BREAST LUMPECTOMY WITH AXILLARY LYMPH NODE BIOPSY Right 10/16/2019   Procedure: RIGHT BREAST LUMPECTOMY WITH SENTINEL LYMPH NODE BIOPSY;  Surgeon: Stark Klein, MD;  Location: [facility];  Service: General;  Laterality: Right;   COLONOSCOPY     ESOPHAGEAL BANDING  10/27/2021   Procedure: ESOPHAGEAL BANDING;  Surgeon: Irene Shipper, MD;  Location: [facility];  Service: Endoscopy;;   ESOPHAGOGASTRODUODENOSCOPY (EGD) WITH PROPOFOL N/A 10/27/2021   Procedure: ESOPHAGOGASTRODUODENOSCOPY (EGD) WITH PROPOFOL;  Surgeon: Irene Shipper, MD;  Location[facility];  Service: Endoscopy;  Laterality: N/A;   HAMMER TOE SURGERY Left 2012   pins placed in great toe, 2nd,3rd, 4th toes, all pins removed except great toe   IR PARACENTESIS  11/13/2019   IR RADIOLOGIST EVAL & MGMT  08/16/2019   IR RADIOLOGIST EVAL & MGMT  02/06/2020  IR RADIOLOGIST EVAL & MGMT  06/26/2020   IR RADIOLOGIST EVAL & MGMT  12/31/2020   IR RADIOLOGIST EVAL & MGMT  07/21/2021   IR RADIOLOGIST EVAL & MGMT  10/08/2021   IR THORACENTESIS ASP PLEURAL SPACE W/IMG GUIDE  10/12/2021   IR THORACENTESIS ASP PLEURAL SPACE W/IMG GUIDE  10/26/2021    IR THORACENTESIS ASP PLEURAL SPACE W/IMG GUIDE  11/19/2021   PELVIC LAPAROSCOPY Bilateral 1992   RADIOLOGY WITH ANESTHESIA N/A 09/16/2021   Procedure: CT MICROWAVE ABLATION;  Surgeon: Aletta Edouard, MD;  Location: [facility];  Service: Radiology;  Laterality: N/A;   UPPER GASTROINTESTINAL ENDOSCOPY  2002   had esophageal dilitation     Allergies: Hydromorphone hcl and Aspirin  Medications: Prior to Admission medications   Medication Sig Start Date End Date Taking? Authorizing Provider  dicyclomine (BENTYL) 20 MG tablet Take 0.5 tablets (10 mg total) by mouth 3 (three) times daily before meals. Take one tablet every 6-8 hours as needed for abdominal cramping 12/01/21  Yes Noralyn Pick, NP  eplerenone (INSPRA) 25 MG tablet Take 25 mg by mouth daily. 11/18/21  Yes [provider]  furosemide (LASIX) 40 MG tablet Take 1 tablet (40 mg total) by mouth daily. 11/02/21 12/16/21 Yes Amin, Jeanella Flattery, MD  loperamide (IMODIUM) 2 MG capsule Take 4 mg by mouth every other day.   Yes [provider]  nicotine (NICODERM CQ - DOSED IN MG/24 HOURS) 21 mg/24hr patch Place 21 mg onto the skin daily as needed (smoking cessation on work days).   Yes [provider]  ondansetron (ZOFRAN-ODT) 4 MG disintegrating tablet Dissolve one tablet on the tongue every 8 hours as needed for nausea and vomiting. 12/01/21  Yes Noralyn Pick, NP  pantoprazole (PROTONIX) 40 MG tablet Take 1 tablet (40 mg total) by mouth daily. 12/01/21  Yes Noralyn Pick, NP  potassium chloride (KLOR-CON) 10 MEQ tablet Take 30 mEq by mouth as needed. As directed 11/13/21  Yes [provider]  tamoxifen (NOLVADEX) 20 MG tablet TAKE 1 TABLET BY MOUTH EVERY DAY Patient taking differently: Take 20 mg by mouth every morning. 10/13/20  Yes Truitt Merle, MD  traZODone (DESYREL) 100 MG tablet Take 1 tablet (100 mg total) by mouth at bedtime as needed for sleep. 12/01/21  Yes Truitt Merle, MD   ibuprofen (ADVIL) 200 MG tablet Take 400 mg by mouth every 6 (six) hours as needed for mild pain.    [provider]  potassium chloride SA (KLOR-CON M) 20 MEQ tablet Take 1 tablet (20 mEq total) by mouth daily. 12/02/21 01/01/22  Noralyn Pick, NP  promethazine (PHENERGAN) 25 MG tablet Take 1 tablet (25 mg total) by mouth every 8 (eight) hours as needed for nausea or vomiting. Patient not taking: Reported on 12/01/2021 05/25/21   Edrick Kins, DPM  sucralfate (CARAFATE) 1 GM/10ML suspension Take 10 mLs (1 g total) by mouth 2 (two) times daily. 11/02/21 12/02/21  Damita Lack, MD     Vital Signs: BP 117/79    Pulse 76    Temp 98.6 F (37 C) (Oral)    Resp 16    Wt 100 lb (45.4 kg)    LMP 04/20/2013    SpO2 98%    BMI 17.71 kg/m   Physical Exam awake, alert.  Chest with diminished breath sounds left base, right clear.  Heart with regular rate and rhythm.  Abdomen soft, positive bowel sounds, nontender.  No lower extremity edema.  Imaging:  NM PET Image Initial (PI) Skull Base To Thigh  Result Date: 12/15/2021 CLINICAL DATA:  Subsequent treatment strategy for hepatocellular carcinoma. Prior liver directed therapy (microwave ablation) EXAM: NUCLEAR MEDICINE PET SKULL BASE TO THIGH TECHNIQUE: 5.0 mCi F-18 FDG was injected intravenously. Full-ring PET imaging was performed from the skull base to thigh after the radiotracer. CT data was obtained and used for attenuation correction and anatomic localization. Fasting blood glucose: 111 mg/dl COMPARISON:  CT 12 1322, 10/29/2021 FINDINGS: Mediastinal blood pool activity: SUV max 2.1 Liver activity: SUV max NA NECK: No hypermetabolic lymph nodes in the neck. Incidental CT findings: none CHEST: No hypermetabolic mediastinal lymph nodes. No hypermetabolic pulmonary nodules. Moderate size LEFT pleural effusion. Linear nodularity in the lingular lobe (image 168/4) does not metabolic activity. Incidental CT findings: none ABDOMEN/PELVIS: Intense  focus of metabolic activity posterior RIGHT hepatic lobe with SUV max equal 6.4. Lesion is not identified on noncontrast CT portion exam but measures approximately 4 cm x 2.6 cm on the PET portion exam. No additional discrete hypermetabolic lesions present. The smaller lesions identified on comparison MRI in the RIGHT hepatic lobe are non appreciated by PET imaging. No hypermetabolic periportal lymph nodes. Incidental CT findings: Small amount free fluid the pelvis. SKELETON: No aggressive osseous lesion Incidental CT findings: none IMPRESSION: 1. Intensely hypermetabolic mass in the posterior RIGHT hepatic lobe consistent with known hepatocellular carcinoma. Smaller lesions identified on MRI are not hypermetabolic. 2. No evidence of metastatic lymphadenopathy or distant metastatic disease. 3. Elongated nodular thickening in the lingula is favored benign. Moderate LEFT pleural effusion. Electronically Signed   By: Suzy Bouchard M.D.   On: 12/15/2021 12:00   DG CHEST PORT 1 VIEW  Result Date: 12/14/2021 CLINICAL DATA:  Status post thoracentesis EXAM: PORTABLE CHEST 1 VIEW COMPARISON:  11/19/2021 FINDINGS: Surgical clips project over the lateral right chest. Midline trachea. Normal heart size and mediastinal contours. Small left pleural effusion is similar. No pneumothorax. No lobar consolidation. Mild interstitial thickening likely relates to chronic bronchitis/emphysema. IMPRESSION: Similar small left pleural effusion, without superimposed acute process. Emphysema (ICD10-J43.9). Electronically Signed   By: Abigail Miyamoto M.D.   On: 12/14/2021 14:56   US THORACENTESIS ASP PLEURAL SPACE W/IMG GUIDE  Result Date: 12/14/2021 INDICATION: Recurrent pleural effusion EXAM: ULTRASOUND GUIDED LEFT THORACENTESIS MEDICATIONS: None. COMPLICATIONS: None immediate. PROCEDURE: An ultrasound guided thoracentesis was thoroughly discussed with the patient and questions answered. The benefits, risks, alternatives and  complications were also discussed. The patient understands and wishes to proceed with the procedure. Written consent was obtained. Ultrasound was performed to localize and mark an adequate pocket of fluid in the left chest. The area was then prepped and draped in the normal sterile fashion. 1% Lidocaine was used for local anesthesia. Under ultrasound guidance a 6 Fr Safe-T-Centesis catheter was introduced. Thoracentesis was performed. The catheter was removed and a dressing applied. FINDINGS: A total of approximately 900 cc of clear yellow fluid was removed. IMPRESSION: Successful ultrasound guided left thoracentesis yielding 900 cc of pleural fluid. Performed and dictated by Pasty Spillers, PA-C Electronically Signed   By: Jacqulynn Cadet M.D.   On: 12/14/2021 15:29    Labs:  CBC: Recent Labs    10/31/21 0547 11/01/21 0508 12/01/21 1010 12/16/21 0750  WBC 8.1 6.5 5.4 5.1  HGB 8.8* 9.2* 10.4* 10.2*  HCT 28.5* 29.2* 32.8* 33.2*  PLT 70* 68* 93.0* 77*    COAGS: Recent Labs    10/26/21 2001 10/27/21 0429 10/30/21 0055 10/31/21 0547 12/01/21 1010  12/16/21 0750  INR 1.8*   < > 1.5* 1.5* 1.3* 1.4*  APTT 31  --   --   --   --   --    < > = values in this interval not displayed.    BMP: Recent Labs    10/30/21 0055 10/31/21 0547 11/01/21 0508 12/01/21 1010 12/08/21 1427 12/16/21 0750  NA 132* 131* 133* 138 135 136  K 4.1 3.2* 3.8 2.9* 3.1* 3.7  CL 102 100 104 101 101 106  CO2 24 25 26 28 27 23   GLUCOSE 134* 80 118* 103* 200* 103*  BUN 8 10 10 10 7  5*  CALCIUM 7.0* 7.2* 7.7* 8.2* 8.2* 8.0*  CREATININE 0.61 0.59 0.60 0.65 0.63 0.58  GFRNONAA >60 >60 >60  --   --  >60    LIVER FUNCTION TESTS: Recent Labs    10/31/21 0547 11/01/21 0508 12/01/21 1010 12/16/21 0750  BILITOT 1.4* 1.0 1.3* 1.0  AST 31 29 29  35  ALT 15 15 13 17   ALKPHOS 49 43 83 69  PROT 5.2* 5.5* 6.7 6.4*  ALBUMIN 2.7* 2.8* 3.2* 2.9*    Assessment and Plan: Patient known to IR service from  multiple thoracenteses as well as thermal ablation of a 1.8 cm left lobe [place] in 2022.  She has a history of chronic hepatitis C ,cirrhosis, esophageal varices with recent bleeding requiring transfusion in late November/early December 2022,  recurrent left pleural effusion after ablation procedure, prior breast cancer, COPD, GERD, IBS, portal hypertension, Raynaud's syndrome, thrombocytopenia. Current imaging suggests an enlarging malignant lesion in the posterior aspect of segment VII of the right lobe now measuring up to approximately 3.6 x 4 cm. Enhancement characteristics by MR and CT are now consistent with hepatocellular carcinoma. There is also an enlarging nodular lesion in the inferior tip of the right lobe in segment VI measuring 1 x 1.2 cm and consistent with HCC. Other small foci of arterial hyperenhancement also visible in segment VI. Enhancement along superior aspect of the left lobe ablation defect by CT is favored to be a perfusion defect and not recurrent malignancy, which is also supported by recent MRI.It now appears that she has multifocal [place] in the right lobe of the liver and the best treatment would be directed Y-90 radioembolization.  She presents today for pre-Y 90 arteriography with embolization and test Y 90 dosing.  PET scan yesterday revealed:  1. Intensely hypermetabolic mass in the posterior RIGHT hepatic lobe consistent with known hepatocellular carcinoma. Smaller lesions identified on MRI are not hypermetabolic. 2. No evidence of metastatic lymphadenopathy or distant metastatic disease. 3. Elongated nodular thickening in the lingula is favored benign. Moderate LEFT pleural effusion  Risks and benefits of procedure were discussed with the patient including, but not limited to bleeding, infection, vascular injury or contrast induced renal failure.  This interventional procedure involves the use of X-rays and because of the nature of the planned procedure, it is possible that  we will have prolonged use of X-ray fluoroscopy.  Potential radiation risks to you include (but are not limited to) the following: - A slightly elevated risk for cancer  several years later in life. This risk is typically less than 0.5% percent. This risk is low in comparison to the normal incidence of human cancer, which is 33% for women and 50% for men according to the [place]. - Radiation induced injury can include skin redness, resembling a rash, tissue breakdown / ulcers and hair loss (which  can be temporary or permanent).   The likelihood of either of these occurring depends on the difficulty of the procedure and whether you are sensitive to radiation due to previous procedures, disease, or genetic conditions.   IF your procedure requires a prolonged use of radiation, you will be notified and given written instructions for further action.  It is your responsibility to monitor the irradiated area for the 2 weeks following the procedure and to notify your physician if you are concerned that you have suffered a radiation induced injury.    All of the patient's questions were answered, patient is agreeable to proceed.  Consent signed and in chart.      Electronically Signed: D. Rowe Robert, PA-C 12/16/2021, 8:56 AM   I spent a total of 25 minutes at the the patient's bedside AND on the patient's hospital floor or unit, greater than 50% of which was counseling/coordinating care for hepatic/visceral arteriogram with embolization/test Y 90 dosing

## 2021-12-16 NOTE — Procedures (Signed)
Interventional Radiology Procedure Note  Procedure: Hepatic arteriography and MAA injection  Complications: None  Estimated Blood Loss: < 10 mL  Access: Right CFA, 5 Fr sheath  Findings: Normal hepatic arterial anatomy. Dominant posterior and superior right hepatic arterial branch supplies area of hypervascular tumor in the posterior right lobe of liver. Smaller inferior right hepatic arterial branch supplies focal area of hypervascularity in inferior tip of right lobe.   Cystic artery originates from proximal right hepatic artery. Attempted catheterization of cystic artery unsuccessful. 4.4 mCi Tc 99-m MAA injected into right hepatic artery just beyond cystic artery at planned location of Y-90 treatment next week.  Plan: NM imaging with lung shunt fraction calculation to follow. Total of 4 hour recovery today. Right lobe radioembolization next week.  Theresa Rocha. Kathlene Cote, M.D Pager:  [phone]

## 2021-12-17 ENCOUNTER — Inpatient Hospital Stay (HOSPITAL_BASED_OUTPATIENT_CLINIC_OR_DEPARTMENT_OTHER): Payer: BC Managed Care – PPO | Admitting: Hematology

## 2021-12-17 ENCOUNTER — Encounter: Payer: Self-pay | Admitting: Hematology

## 2021-12-17 DIAGNOSIS — C50111 Malignant neoplasm of central portion of right female breast: Secondary | ICD-10-CM

## 2021-12-17 DIAGNOSIS — Z170 Estrogen receptor positive status [ER+]: Secondary | ICD-10-CM

## 2021-12-17 DIAGNOSIS — C220 Liver cell carcinoma: Secondary | ICD-10-CM | POA: Diagnosis not present

## 2021-12-17 NOTE — Progress Notes (Signed)
[place]   [phone] Fax:([phone]) [phone]   Clinic Follow up Note   Patient Care Team: Seward Carol, MD as PCP - General (Internal Medicine) Arna Snipe, RN as Oncology Nurse Navigator Irene Shipper, MD as Consulting Physician (Gastroenterology) Truitt Merle, MD as Consulting Physician (Hematology) Alla Feeling, NP as Nurse Practitioner (Nurse Practitioner) Stark Klein, MD as Consulting Physician (General Surgery) Aletta Edouard, MD as Consulting Physician (Interventional Radiology) Roosevelt Locks, CRNP as Nurse Practitioner (Nurse Practitioner) Mauro Kaufmann, RN as Oncology Nurse Navigator Rockwell Germany, RN as Oncology Nurse Navigator Eppie Gibson, MD as Attending Physician (Radiation Oncology) Trula Slade, DPM as Consulting Physician (Podiatry)  Date of Service:  12/17/2021  I connected with Theresa Rocha on 12/17/2021 at 12:40 PM EST by telephone visit and verified that I am speaking with the correct person using two identifiers.  I discussed the limitations, risks, security and privacy concerns of performing an evaluation and management service by telephone and the availability of in person appointments. I also discussed with the patient that there may be a patient responsible charge related to this service. The patient expressed understanding and agreed to proceed.   Other persons participating in the visit and their role in the encounter:  none  Patient's location:  home Provider's location:  my office  CHIEF COMPLAINT: f/u of liver cancer, right breast cancer  CURRENT THERAPY:  -Anastrozole $RemoveBefor'1mg'TsCcjzKBHOjj$  daily started in Nov 2020. Due to worsened knee pain/arthritis, she was switched to Tamoxifen in 03/2020.  -Zometa q69months starting in 06/2020, held before 3rd dose on 05/01/21, due to mouth sores.   ASSESSMENT & PLAN:  Theresa Rocha is a 56 y.o. female with   1. Multifocal Hepatocellular carcinoma, cT2N0Mx, stage II -presented with  abdominal pain and rectal bleeding in 06/2019. Work up scans showed multifocal lesions of liver consistent with [place] with indeterminate right lobe lesions. Her AFP was in normal range.  -she was found to not be a transplant candidate due to history of breast cancer and multifocal HCC beyond Milan criteria. She was also not felt to be a surgical candidate due to her liver function and location of tumors. She has been on surveillance. -she was initially not felt to be a good candidate for liver-targeted ablation. She was recently found to have enlarging left lobe liver lesions on surveillance CT in 06/2021. She was seen in follow up with Dr. Kathlene Cote, and they opted to proceed with ablation on 09/16/21. -post-treatment MRI 10/30/21 and CT abdomen on 11/10/21 showed enlarging right liver lobe seg VII lesion measuring 4cm.  -PET on 12/15/21 showed hypermetabolism only to the right hepatic lobe, no other distant metastasis. I reviewed the results with her today. -she is scheduled to proceed with Y90 to the right lobe on 12/22/21. She will likely f/u with Dr. Kathlene Cote in 3 months with repeated liver images    2. Recurrent left Pleural Effusion -occurred following left lobe ablation on 09/16/21 -has been recurrent and persistent, "suspicious for iatrogenic hepatic hydrothorax with ascites preferentially flowing into the left pleural space after the left lobe microwave ablation" per IR -cytology from thoracentesis on 11/19/21 was negative. -PET scan negative for thoracic metastasis   3. Malignant neoplasm of central portion of right breast, Stage II, c(T2N0M0), ER/PR+, HER2-, Grade II, RS 21 -diagnosed in 07/2019, s/p right lumpectomy with SNLB.  -Recurrence Score 21 (low risk)  -She completed adjuvant RT with Dr Isidore Moos 12/03/19-12/31/19 -She started anastrozole in 09/2019. Due  to knee joint pain, she was switched to Tamoxifen in 03/2020. She is tolerating with hot flashes. Will continue to complete 10 years.  -most  recent mammogram from 09/2020 was negative. -From a breast cancer standpoint, She is clinically doing well. She is, however, overdue for annual mammogram.   4. 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  -s/p harvoni and ribavirin treatment for Hep C per Dr. Bradd Burner in 2018 -EGD on 10/27/21 by Dr. Henrene Pastor while hospitalized showed esophageal varices, which were banded at that time. -Continue to F/u with Dr Henrene Pastor and Roosevelt Locks   5. Alcohol and smoking Cessation  -She has h/o smoking 0.5 PPD x35 years. She drank 3-4 cans beer nightly x20 years. -Cessation of both was discussed and strongly encouraged. She has stopped drinking alcohol but smokes 5 cigarettes a day still. I encouraged her to continue reducing until she quits completely.    6. Osteoporosis, Hypocalcemia  -DEXA from 09/21/19 showed osteoporosis T score -3.1 at [facility].   -I started her on Zometa q30months for 2 years on 07/17/20. Held before 3rd dose on 05/01/21, due to mouth sores. She has full sets of dentures and has not seen dentist in years. -I previously recommended her to start Calcium supplement daily  -She will repeat DEXA with Gyn office in late 2022.    7. Thrombocytopenia, [place] -secondary to liver cirrhosis, splenomegaly, and/or varices -I previously recommend she start oral iron.       PLAN:  -PET scan reviewed -proceed with Y90 as scheduled on 12/22/21 -lab and f/u in 4 months  -she is overdue for mammogram, she will call to schedule    No problem-specific Assessment & Plan notes found for this encounter.    SUMMARY OF ONCOLOGIC HISTORY: Oncology History Overview Note  Cancer Staging Hepatocellular carcinoma [facility]) Staging form: Liver, AJCC 8th Edition - Clinical stage from 08/02/2019: Stage II (cT2, cN0, cM0) - Signed by Truitt Merle, MD on 08/02/2019  Malignant neoplasm of central portion of right breast in female,  estrogen receptor positive ([place]) Staging form: Breast, AJCC 8th Edition - Clinical stage from 08/29/2019: Stage IB (cT2, cN0, cM0, G2, ER+, PR+, HER2-) - Signed by Truitt Merle, MD on 09/05/2019    Hepatocellular carcinoma ([place])  07/04/2019 Imaging   CT AP IMPRESSION: 1. Findings of cirrhosis and hepatomegaly.  Patent portal vein. 2. Numerous varicosities as described above. 3. Moderate to large amount of abdominopelvic ascites 4. Contrast filled appendix which appears to be a mildly dilated with apparent wall thickening which could be due to surrounding ascites.   07/09/2019 Imaging   ABD US IMPRESSION: 1. Three small hypoechoic lesions in the RIGHT hepatic lobe measuring less than 1 cm but not evident on comparison contrast enhanced CT from 07/04/2019. Recommend MRI of the abdomen without and with contrast to evaluate these hepatic lesions in cirrhotic patient. 2. Increased liver echogenicity and lobular contour consistent cirrhosis. 3. Moderate mild to moderate volume of ascites. 4. Mild gallbladder wall thickening likely related to ascites. 5. No cholelithiasis or cholecystitis evident   07/26/2019 Imaging   MR ABD IMPRESSION: 1. In the lateral segment left hepatic lobe there are two LI-RADS category LR-5 lesions representing hepatocellular carcinoma. 2. There is also a LI-RADS category LR-3 lesion peripherally in the right hepatic lobe, intermediate probability for hepatocellular carcinoma. 3. Hepatic cirrhosis and diffuse wall thickening of the gallbladder. Widespread steatosis in the liver. 4.  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 5.  Aortic Atherosclerosis (ICD10-I70.0). 6. Widespread ascites and mesenteric edema.   07/26/2019 Tumor Marker   AFP 5.5 (baseline)   08/02/2019 Initial Diagnosis   Hepatocellular carcinoma ([place])   08/02/2019 Cancer Staging   Staging form: Liver, AJCC 8th Edition - Clinical stage from 08/02/2019: Stage II (cT2, cN0, cM0) -  Signed by Truitt Merle, MD on 08/02/2019    08/07/2019 Imaging   CT Chest 08/07/19 IMPRESSION: 1. Tiny bilateral pulmonary nodules measuring up to 6 mm. These are too small to definitively characterize. Given the history of HCC, close follow-up recommended to ensure stability. 2.  Emphysema. (ICD10-J43.9)   08/09/2019 Procedure   Upper Endoscopy by Dr Henrene Pastor 08/09/19  IMPRESSION 1. Small esophageal varices 2. Mild diffuse portal hypertensive gastropathy 3. Otherwise normal EGD.   02/04/2020 Imaging   MRI abdomen  IMPRESSION: Stable exam. The small segment II lesions identified previously as LI-RADS category 5 are stable in size.   Previously characterized tiny LI-RADS 3 lesion in the inferior right liver is stable.   No new suspicious focal hepatic lesion on a background liver morphology consistent with cirrhosis.   Interval decrease in ascites with decrease and diffuse gallbladder wall thickening.     06/18/2020 Imaging   MRI Abdomen  IMPRESSION: 1. Stable 1.1 x 0.9 cm rounded lesion in the medial left lobe of the liver, hepatic segment II, demonstrating arterial phase hyperenhancement without washout or capsule. This has previously been described as a LI-RADS category 5 lesion based on prior observations, if categorized by current imaging features this is consistent with LI-RADS category 4. 2. Stable 1.5 x 1.2 cm lesion lateral and inferior to this lesion in hepatic segment II with very subtle evidence of washout but without capsular enhancement. LI-RADS category 5. 3. Stable 5 mm focus of early arterial phase hyperenhancement of the inferior right lobe of the liver, hepatic segment VI, without evidence of washout or capsule. This remains consistent with LI-RADS category 3. 4. No new suspicious lesions or contrast enhancement. 5. Cirrhotic morphology of the liver. 6. Trace perihepatic ascites.     10/30/2021 Imaging   EXAM: MRI ABDOMEN WITHOUT AND WITH  CONTRAST  IMPRESSION: 1. Ablation defect of the left lobe of the liver which subtends two lesions previously noted in hepatic segment II. No evidence of residual contrast enhancement to suggest viable tumor.  2. Significant interval increase in size of a hyperenhancing lesion of the posterior liver dome, hepatic segment VII, measuring 4.0 x 3.7 cm, previously 2.6 x 2.5 cm when measured similarly. This demonstrates some heterogeneous contrast enhancement although no clear evidence of washout or capsular enhancement. Findings remain LI-RADS category 5, consistent with hepatocellular carcinoma. 3. Slight interval increase in size of a hyperenhancing lesion of the inferior tip of the right lobe of the liver, hepatic segment VI, measuring 1.1 x 0.7 cm, previously no greater than 0.6 cm, as well as an additional adjacent lesion measuring 0.6 cm, previously 0.4 cm. Given threshold growth, these lesions are upstaged to LI-RADS category 4 and suspicious for additional small foci of hepatocellular carcinoma. 4. Cirrhotic morphology of the liver and hepatic steatosis. 5. Small volume ascites throughout the abdomen. 6. Large left, small right pleural effusions and associated atelectasis or consolidation. 7. Layering sludge in the gallbladder with mild gallbladder wall thickening and pericholecystic fluid, similar to prior examination although nonspecific in the setting of ascites. No discrete gallstones.   11/10/2021 Imaging   EXAM: CT ABDOMEN WITHOUT AND  WITH CONTRAST  IMPRESSION: Post treatment changes about the LEFT hepatic lobe lesion with variable arterial enhancement not showing washout and with non masslike features, favor perfusional defect, characterized as TR equivocal but favor nonviable.   Lesion in the posterior RIGHT hepatic lobe over time with enlargement and with serpiginous internal low signal on MR and low attenuation on CT. Findings categorized as LR M given signs of rim like enhancement on  previous imaging and infiltrative appearance. Potentially infiltrative HCC but without specific imaging features of hepatocellular carcinoma by LI-RADS.   Enlarging lesion in the inferior RIGHT hepatic lobe greater than 50% diameter increase over 6 month interval, LI-RADS category 5 showing non rim arterial phase enhancement.   Additional foci of arterial phase enhancement without definitive washout, categorized as LI-RADS category 3 but in aggregate are more suspicious for small foci of hepatocellular carcinoma, attention on follow-up.   11/19/2021 Pathology Results   A. PLEURAL FLUID, LEFT, THORACENTESIS:   FINAL MICROSCOPIC DIAGNOSIS:  - No malignant cells identified  - Reactive mesothelial cells present    12/15/2021 PET scan   IMPRESSION: 1. Intensely hypermetabolic mass in the posterior RIGHT hepatic lobe consistent with known hepatocellular carcinoma. Smaller lesions identified on MRI are not hypermetabolic. 2. No evidence of metastatic lymphadenopathy or distant metastatic disease. 3. Elongated nodular thickening in the lingula is favored benign. Moderate LEFT pleural effusion.   Malignant neoplasm of central portion of right breast in female, estrogen receptor positive ([place])  08/29/2019 Cancer Staging   Staging form: Breast, AJCC 8th Edition - Clinical stage from 08/29/2019: Stage IB (cT2, cN0, cM0, G2, ER+, PR+, HER2-) - Signed by Truitt Merle, MD on 09/05/2019    08/29/2019 Mammogram   Diagnostic mammogram 08/29/19 IMPRESSION: Suspicious mass in the 12 o'clock region of the right breast 3 cm from the nipple measuring 3.2 x 1.6 x 2.6 cm   08/29/2019 Initial Biopsy   Diagnosis 08/29/19 Breast, right, needle core biopsy, 12 o'clock - INVASIVE DUCTAL CARCINOMA, SEE COMMENT.   08/29/2019 Receptors her2   Results: IMMUNOHISTOCHEMICAL AND MORPHOMETRIC ANALYSIS PERFORMED MANUALLY The tumor cells are NEGATIVE for Her2 (1+). Estrogen Receptor: 100%, POSITIVE, STRONG STAINING  INTENSITY Progesterone Receptor: 90%, POSITIVE, STRONG STAINING INTENSITY Proliferation Marker Ki67: 15%   09/05/2019 Initial Diagnosis   Malignant neoplasm of central portion of right breast in female, estrogen receptor positive ([place])   09/13/2019 Breast MRI   MRI breast 09/13/19 IMPRESSION: 1. Irregular spiculated enhancing mass in the right breast representing the patient's known malignancy. No abnormal right axillary lymph nodes. 2. Prominent left internal mammary lymph node is indeterminate. No abnormal appearing right internal mammary lymph nodes. 3. No evidence of malignancy in the left breast.   10/16/2019 Cancer Staging   Staging form: Breast, AJCC 8th Edition - Pathologic stage from 10/16/2019: Stage IA (pT2, pN0, cM0, G2, ER+, PR+, HER2-, Oncotype DX score: 21) - Signed by Gardenia Phlegm, NP on 11/07/2019    10/16/2019 Oncotype testing   Oncotype 10/16/19 Recurrence score 21 with 7% benefit of Tamoxifen or AI. There is less than 1 % benefit of chemo.    10/16/2019 Surgery   RIGHT BREAST LUMPECTOMY WITH SENTINEL LYMPH NODE BIOPSY by Dr Barry Dienes 10/16/19    10/16/2019 Pathology Results   FINAL MICROSCOPIC DIAGNOSIS: 10/16/19  A. BREAST, RIGHT, LUMPECTOMY:  - Invasive ductal carcinoma, 2.3 cm, Nottingham grade 2 of 3.  - Margins of resection are not involved (Closest margin: 1 mm,  anterior).  - See oncology table.   B.  SENTINEL LYMPH NODE, RIGHT AXILLARY #1, BIOPSY:  - One lymph node, negative for carcinoma (0/1).   C. SENTINEL LYMPH NODE, RIGHT AXILLARY #2, BIOPSY:  - One lymph node, negative for carcinoma (0/1).    09/2019 -  Anti-estrogen oral therapy   Anastrozole $RemoveBefo'1mg'tWotkQBUnxF$  daily started in Nov 2020. Due to worsened knee pain/arthritis, she was switched to Tamoxifen in 03/2020.    12/03/2019 - 12/31/2019 Radiation Therapy   Adjuvant RT with Dr Isidore Moos 12/03/19-12/31/19   04/01/2020 Survivorship   SCP delivered by virtual visit per Cira Rue, NP        INTERVAL HISTORY:  Theresa Rocha was contacted for a follow up of liver cancer. She was last seen by me on 12/01/21.    All other systems were reviewed with the patient and are negative.  MEDICAL HISTORY:  Past Medical History:  Diagnosis Date   Anemia    Breast cancer ([place]) 08/30/2019   Cancer ([place])    liver- just diagnosed, not started treatment yet   Cervicalgia    COPD (chronic obstructive pulmonary disease) (HCC)    Esophageal varices (HCC)    GERD (gastroesophageal reflux disease)    Headache    Hepatic cirrhosis (H[facility])    Hepatitis C    resolved 2018   Hepatocellular carcinoma ([facility])    IBS (irritable bowel syndrome)    Lactose intolerance    Personal history of radiation therapy 11/2019   Right breast   Portal hypertension ([place])    Raynaud's syndrome    Thrombocytopenia ([place])     SURGICAL HISTORY: Past Surgical History:  Procedure Laterality Date   BREAST BIOPSY Right 09/2019   BREAST LUMPECTOMY Right 09/2019   BREAST LUMPECTOMY WITH AXILLARY LYMPH NODE BIOPSY Right 10/16/2019   Procedure: RIGHT BREAST LUMPECTOMY WITH SENTINEL LYMPH NODE BIOPSY;  Surgeon: Stark Klein, MD;  Location: [facility];  Service: General;  Laterality: Right;   COLONOSCOPY     ESOPHAGEAL BANDING  10/27/2021   Procedure: ESOPHAGEAL BANDING;  Surgeon: Irene Shipper, MD;  Location: [facility];  Service: Endoscopy;;   ESOPHAGOGASTRODUODENOSCOPY (EGD) WITH PROPOFOL N/A 10/27/2021   Procedure: ESOPHAGOGASTRODUODENOSCOPY (EGD) WITH PROPOFOL;  Surgeon: Irene Shipper, MD;  Location: [facility];  Service: Endoscopy;  Laterality: N/A;   HAMMER TOE SURGERY Left 2012   pins placed in great toe, 2nd,3rd, 4th toes, all pins removed except great toe   IR ANGIOGRAM SELECTIVE EACH ADDITIONAL VESSEL  12/16/2021   IR ANGIOGRAM SELECTIVE EACH ADDITIONAL VESSEL  12/16/2021   IR ANGIOGRAM VISCERAL SELECTIVE  12/16/2021   IR PARACENTESIS  11/13/2019   IR RADIOLOGIST EVAL & MGMT  08/16/2019   IR RADIOLOGIST  EVAL & MGMT  02/06/2020   IR RADIOLOGIST EVAL & MGMT  06/26/2020   IR RADIOLOGIST EVAL & MGMT  12/31/2020   IR RADIOLOGIST EVAL & MGMT  07/21/2021   IR RADIOLOGIST EVAL & MGMT  10/08/2021   IR THORACENTESIS ASP PLEURAL SPACE W/IMG GUIDE  10/12/2021   IR THORACENTESIS ASP PLEURAL SPACE W/IMG GUIDE  10/26/2021   IR THORACENTESIS ASP PLEURAL SPACE W/IMG GUIDE  11/19/2021   IR US GUIDE VASC ACCESS RIGHT  12/16/2021   PELVIC LAPAROSCOPY Bilateral 1992   RADIOLOGY WITH ANESTHESIA N/A 09/16/2021   Procedure: CT MICROWAVE ABLATION;  Surgeon: Aletta Edouard, MD;  Location: [facility];  Service: Radiology;  Laterality: N/A;   UPPER GASTROINTESTINAL ENDOSCOPY  2002   had esophageal dilitation    I have reviewed the social history and family history with  the patient and they are unchanged from previous note.  ALLERGIES:  is allergic to hydromorphone hcl and aspirin.  MEDICATIONS:  Current Outpatient Medications  Medication Sig Dispense Refill   dicyclomine (BENTYL) 20 MG tablet Take 0.5 tablets (10 mg total) by mouth 3 (three) times daily before meals. Take one tablet every 6-8 hours as needed for abdominal cramping 90 tablet 0   eplerenone (INSPRA) 25 MG tablet Take 25 mg by mouth daily.     furosemide (LASIX) 40 MG tablet Take 1 tablet (40 mg total) by mouth daily. 30 tablet 0   ibuprofen (ADVIL) 200 MG tablet Take 400 mg by mouth every 6 (six) hours as needed for mild pain.     loperamide (IMODIUM) 2 MG capsule Take 4 mg by mouth every other day.     nicotine (NICODERM CQ - DOSED IN MG/24 HOURS) 21 mg/24hr patch Place 21 mg onto the skin daily as needed (smoking cessation on work days).     ondansetron (ZOFRAN-ODT) 4 MG disintegrating tablet Dissolve one tablet on the tongue every 8 hours as needed for nausea and vomiting. 30 tablet 0   pantoprazole (PROTONIX) 40 MG tablet Take 1 tablet (40 mg total) by mouth daily. 90 tablet 1   potassium chloride (KLOR-CON) 10 MEQ tablet Take 30 mEq by mouth as  needed. As directed     potassium chloride SA (KLOR-CON M) 20 MEQ tablet Take 1 tablet (20 mEq total) by mouth daily. 30 tablet 0   promethazine (PHENERGAN) 25 MG tablet Take 1 tablet (25 mg total) by mouth every 8 (eight) hours as needed for nausea or vomiting. (Patient not taking: Reported on 12/01/2021) 20 tablet 0   sucralfate (CARAFATE) 1 GM/10ML suspension Take 10 mLs (1 g total) by mouth 2 (two) times daily. 600 mL 0   tamoxifen (NOLVADEX) 20 MG tablet TAKE 1 TABLET BY MOUTH EVERY DAY (Patient taking differently: Take 20 mg by mouth every morning.) 90 tablet 1   traZODone (DESYREL) 100 MG tablet Take 1 tablet (100 mg total) by mouth at bedtime as needed for sleep. 20 tablet 0   No current facility-administered medications for this visit.    PHYSICAL EXAMINATION: ECOG PERFORMANCE STATUS: 1 - Symptomatic but completely ambulatory  There were no vitals filed for this visit. Wt Readings from Last 3 Encounters:  12/16/21 100 lb (45.4 kg)  12/01/21 100 lb (45.4 kg)  10/30/21 108 lb 3.9 oz (49.1 kg)     No vitals taken today, Exam not performed today  LABORATORY DATA:  I have reviewed the data as listed CBC Latest Ref Rng & Units 12/16/2021 12/01/2021 11/01/2021  WBC 4.0 - 10.5 K/uL 5.1 5.4 6.5  Hemoglobin 12.0 - 15.0 g/dL 10.2(L) 10.4(L) 9.2(L)  Hematocrit 36.0 - 46.0 % 33.2(L) 32.8(L) 29.2(L)  Platelets 150 - 400 K/uL 77(L) 93.0(L) 68(L)     CMP Latest Ref Rng & Units 12/16/2021 12/08/2021 12/01/2021  Glucose 70 - 99 mg/dL 103(H) 200(H) 103(H)  BUN 6 - 20 mg/dL 5(L) 7 10  Creatinine 0.44 - 1.00 mg/dL 0.58 0.63 0.65  Sodium 135 - 145 mmol/L 136 135 138  Potassium 3.5 - 5.1 mmol/L 3.7 3.1(L) 2.9(L)  Chloride 98 - 111 mmol/L 106 101 101  CO2 22 - 32 mmol/L $RemoveB'23 27 28  '[id number]$ Calcium 8.9 - 10.3 mg/dL 8.0(L) 8.2(L) 8.2(L)  Total Protein 6.5 - 8.1 g/dL 6.4(L) - 6.7  Total Bilirubin 0.3 - 1.2 mg/dL 1.0 - 1.3(H)  Alkaline Phos 38 - 126 U/L  69 - 83  AST 15 - 41 U/L 35 - 29  ALT 0 - 44 U/L 17 - 13       RADIOGRAPHIC STUDIES: I have personally reviewed the radiological images as listed and agreed with the findings in the report. NM LIVER TUMOR LOC IMFLAM SPECT 1 DAY  Result Date: 12/16/2021 CLINICAL DATA:  Hepatocellular carcinoma with multifocal disease in the RIGHT hepatic lobe. Preradio embolization mapping. EXAM: NUCLEAR MEDICINE LIVER SCAN; ULTRASOUND MISCELLANEOUS SOFT TISSUE TECHNIQUE: Abdominal images were obtained in multiple projections after intrahepatic arterial injection of radiopharmaceutical. SPECT imaging was performed. Lung shunt calculation was performed. RADIOPHARMACEUTICALS:  4.55millicurie MAA TECHNETIUM TO 18M ALBUMIN AGGREGATED COMPARISON:  PET-CT scan 12/15/2021, CT 11/10/2021 FINDINGS: The injected microaggregated albumin localizes within the RIGHT hepatic lobe. No evidence of activity within the stomach, duodenum, or bowel. Calculated shunt fraction to the lungs equals 6.2%. IMPRESSION: 1. No significant extrahepatic radiotracer activity following intrahepatic arterial injection of MAA. 2. Lung shunt fraction equals 6.2% Electronically Signed   By: Suzy Bouchard M.D.   On: 12/16/2021 20:07   IR Angiogram Visceral Selective  Result Date: 12/16/2021 INDICATION: Hepatocellular carcinoma of the right lobe of the liver. Assessment by arteriography prior to planned yttrium 90 radioembolization. EXAM: 1. ULTRASOUND GUIDANCE FOR VASCULAR ACCESS OF THE RIGHT COMMON FEMORAL ARTERY 2. SELECTIVE VISCERAL ARTERIOGRAPHY WITH INITIAL SELECTIVE ARTERIOGRAPHY OF THE COMMON HEPATIC ARTERY 3. ADDITIONAL SELECTIVE ARTERIOGRAPHY OF THE RIGHT HEPATIC ARTERY 4. ADDITIONAL SELECTIVE ARTERIOGRAPHY OF A RIGHT HEPATIC ARTERY BRANCH MEDICATIONS: None ANESTHESIA/SEDATION: Moderate (conscious) sedation was employed during this procedure. A total of Versed 4.0 mg and Fentanyl 100 mcg was administered intravenously by radiology nursing. Moderate Sedation Time: 62 minutes. The patient's level of  consciousness and vital signs were monitored continuously by radiology nursing throughout the procedure under my direct supervision. CONTRAST:  60 mL Omnipaque 300 FLUOROSCOPY TIME:  Fluoroscopy Time: 9 minutes and 20 seconds. 173 mGy. COMPLICATIONS: None immediate. PROCEDURE: Informed consent was obtained from the patient following explanation of the procedure, risks, benefits and alternatives. The patient understands, agrees and consents for the procedure. All questions were addressed. A time out was performed prior to the initiation of the procedure. Maximal barrier sterile technique utilized including caps, mask, sterile gowns, sterile gloves, large sterile drape, hand hygiene, and chlorhexidine prep. Local anesthesia was provided with 1% lidocaine. Ultrasound was used to confirm patency of the right common femoral artery. A permanent ultrasound image was recorded. Under direct ultrasound guidance, a 21 gauge needle was advanced into the right common femoral artery. After access with a micropuncture set, a 5 French sheath was placed over a guidewire. A 5 French Cobra catheter was advanced into the abdominal aorta and used to selectively catheterize the celiac axis. The catheter was then further advanced into the common hepatic artery. Selective arteriography was performed at the level of the proximal common hepatic artery. The 5 French catheter was then used to selectively catheterize the proximal right hepatic artery over a guidewire. Selective arteriography was performed at the level of the right hepatic artery including rotational arteriography. A microcatheter was advanced through the 5 French catheter and used to selectively catheterize a more distal trunk of the right hepatic artery. Selective arteriography was performed through the microcatheter. Attempt was made to selectively catheterize the cystic artery utilizing a small caliber guidewire and microcatheter. A dose of 4.4 millicuries of technetium 99-m  MAA was then administered through the microcatheter at the level of the right hepatic artery. Catheters were removed.  Hemostasis was obtained at the level of the femoral access utilizing the Cordis ExoSeal device. FINDINGS: Common hepatic arteriography demonstrates typical hepatic arterial anatomy without normal variant. Just beyond the gastroduodenal artery, the proper hepatic artery almost immediately bifurcates into right and left hepatic arteries. There are some tiny branches emanating from the distal proper hepatic artery/proper hepatic bifurcation that likely supply some small duodenal branches and a small right gastric artery. Right hepatic arteriography demonstrates an early branch that supplies a territory at the junction between right and left lobes. A small cystic artery emanates from the distal right hepatic artery and extends inferiorly. Beyond the cystic artery, there is a dominant posterior and superiorly oriented hepatic artery branch supplying the region of known tumor in the posterior aspect of the right lobe of the liver seen by recent imaging with multiple tortuous and hypervascular branches present supplying a roughly oval area of increased arterial vascularity measuring roughly 5 cm in maximum diameter transversely. Dumbbell shaped area of arterial hypervascularity is also seen in the inferior tip of the right lobe of the liver supplied by inferior trunks of the right hepatic artery and suspicious for a smaller focus of hepatocellular carcinoma by prior imaging. There are also 2 small foci of increased arterial vascularity in the lateral aspect of the mid right lobe that could potentially represent small early foci of carcinoma. Based on arteriographic appearance, the entire right lobe will have to be treated with yttrium 90 microspheres. The cystic artery was attempted to be catheterized for prophylactic embolization but is very small and could not be selectively catheterized. There is room  beyond the cystic artery to administer microspheres to the territories of abnormal right lobe vascularity. IMPRESSION: Hepatic arteriography performed in planning for right lobe yttrium 90 radioembolization. Abnormal arterial tumor hypervascularity in the posterior and superior right lobe corresponds to the dominant area of hepatocellular carcinoma in the right lobe and measures roughly 5 cm in maximal diameter. There are 3 other smaller areas of abnormal arterial vascularity in the right lobe including a dumbbell-shaped region in the inferior tip of the liver which was appreciated on the recent CT study and 2 other smaller foci in the lateral aspect of the right lobe suspicious for early foci of carcinoma. The cystic artery could not be catheterized for prophylactic embolization. A dose of MAA was administered in the right hepatic artery at the level of planned treatment for further nuclear medicine imaging and lung shunt fraction calculation. Electronically Signed   By: Aletta Edouard M.D.   On: 12/16/2021 16:12   IR Angiogram Selective Each Additional Vessel  Result Date: 12/16/2021 INDICATION: Hepatocellular carcinoma of the right lobe of the liver. Assessment by arteriography prior to planned yttrium 90 radioembolization. EXAM: 1. ULTRASOUND GUIDANCE FOR VASCULAR ACCESS OF THE RIGHT COMMON FEMORAL ARTERY 2. SELECTIVE VISCERAL ARTERIOGRAPHY WITH INITIAL SELECTIVE ARTERIOGRAPHY OF THE COMMON HEPATIC ARTERY 3. ADDITIONAL SELECTIVE ARTERIOGRAPHY OF THE RIGHT HEPATIC ARTERY 4. ADDITIONAL SELECTIVE ARTERIOGRAPHY OF A RIGHT HEPATIC ARTERY BRANCH MEDICATIONS: None ANESTHESIA/SEDATION: Moderate (conscious) sedation was employed during this procedure. A total of Versed 4.0 mg and Fentanyl 100 mcg was administered intravenously by radiology nursing. Moderate Sedation Time: 62 minutes. The patient's level of consciousness and vital signs were monitored continuously by radiology nursing throughout the procedure under  my direct supervision. CONTRAST:  60 mL Omnipaque 300 FLUOROSCOPY TIME:  Fluoroscopy Time: 9 minutes and 20 seconds. 173 mGy. COMPLICATIONS: None immediate. PROCEDURE: Informed consent was obtained from the patient following explanation of the  procedure, risks, benefits and alternatives. The patient understands, agrees and consents for the procedure. All questions were addressed. A time out was performed prior to the initiation of the procedure. Maximal barrier sterile technique utilized including caps, mask, sterile gowns, sterile gloves, large sterile drape, hand hygiene, and chlorhexidine prep. Local anesthesia was provided with 1% lidocaine. Ultrasound was used to confirm patency of the right common femoral artery. A permanent ultrasound image was recorded. Under direct ultrasound guidance, a 21 gauge needle was advanced into the right common femoral artery. After access with a micropuncture set, a 5 French sheath was placed over a guidewire. A 5 French Cobra catheter was advanced into the abdominal aorta and used to selectively catheterize the celiac axis. The catheter was then further advanced into the common hepatic artery. Selective arteriography was performed at the level of the proximal common hepatic artery. The 5 French catheter was then used to selectively catheterize the proximal right hepatic artery over a guidewire. Selective arteriography was performed at the level of the right hepatic artery including rotational arteriography. A microcatheter was advanced through the 5 French catheter and used to selectively catheterize a more distal trunk of the right hepatic artery. Selective arteriography was performed through the microcatheter. Attempt was made to selectively catheterize the cystic artery utilizing a small caliber guidewire and microcatheter. A dose of 4.4 millicuries of technetium 99-m MAA was then administered through the microcatheter at the level of the right hepatic artery. Catheters were  removed. Hemostasis was obtained at the level of the femoral access utilizing the Cordis ExoSeal device. FINDINGS: Common hepatic arteriography demonstrates typical hepatic arterial anatomy without normal variant. Just beyond the gastroduodenal artery, the proper hepatic artery almost immediately bifurcates into right and left hepatic arteries. There are some tiny branches emanating from the distal proper hepatic artery/proper hepatic bifurcation that likely supply some small duodenal branches and a small right gastric artery. Right hepatic arteriography demonstrates an early branch that supplies a territory at the junction between right and left lobes. A small cystic artery emanates from the distal right hepatic artery and extends inferiorly. Beyond the cystic artery, there is a dominant posterior and superiorly oriented hepatic artery branch supplying the region of known tumor in the posterior aspect of the right lobe of the liver seen by recent imaging with multiple tortuous and hypervascular branches present supplying a roughly oval area of increased arterial vascularity measuring roughly 5 cm in maximum diameter transversely. Dumbbell shaped area of arterial hypervascularity is also seen in the inferior tip of the right lobe of the liver supplied by inferior trunks of the right hepatic artery and suspicious for a smaller focus of hepatocellular carcinoma by prior imaging. There are also 2 small foci of increased arterial vascularity in the lateral aspect of the mid right lobe that could potentially represent small early foci of carcinoma. Based on arteriographic appearance, the entire right lobe will have to be treated with yttrium 90 microspheres. The cystic artery was attempted to be catheterized for prophylactic embolization but is very small and could not be selectively catheterized. There is room beyond the cystic artery to administer microspheres to the territories of abnormal right lobe vascularity.  IMPRESSION: Hepatic arteriography performed in planning for right lobe yttrium 90 radioembolization. Abnormal arterial tumor hypervascularity in the posterior and superior right lobe corresponds to the dominant area of hepatocellular carcinoma in the right lobe and measures roughly 5 cm in maximal diameter. There are 3 other smaller areas of abnormal arterial vascularity in  the right lobe including a dumbbell-shaped region in the inferior tip of the liver which was appreciated on the recent CT study and 2 other smaller foci in the lateral aspect of the right lobe suspicious for early foci of carcinoma. The cystic artery could not be catheterized for prophylactic embolization. A dose of MAA was administered in the right hepatic artery at the level of planned treatment for further nuclear medicine imaging and lung shunt fraction calculation. Electronically Signed   By: Aletta Edouard M.D.   On: 12/16/2021 16:12   IR Angiogram Selective Each Additional Vessel  Result Date: 12/16/2021 INDICATION: Hepatocellular carcinoma of the right lobe of the liver. Assessment by arteriography prior to planned yttrium 90 radioembolization. EXAM: 1. ULTRASOUND GUIDANCE FOR VASCULAR ACCESS OF THE RIGHT COMMON FEMORAL ARTERY 2. SELECTIVE VISCERAL ARTERIOGRAPHY WITH INITIAL SELECTIVE ARTERIOGRAPHY OF THE COMMON HEPATIC ARTERY 3. ADDITIONAL SELECTIVE ARTERIOGRAPHY OF THE RIGHT HEPATIC ARTERY 4. ADDITIONAL SELECTIVE ARTERIOGRAPHY OF A RIGHT HEPATIC ARTERY BRANCH MEDICATIONS: None ANESTHESIA/SEDATION: Moderate (conscious) sedation was employed during this procedure. A total of Versed 4.0 mg and Fentanyl 100 mcg was administered intravenously by radiology nursing. Moderate Sedation Time: 62 minutes. The patient's level of consciousness and vital signs were monitored continuously by radiology nursing throughout the procedure under my direct supervision. CONTRAST:  60 mL Omnipaque 300 FLUOROSCOPY TIME:  Fluoroscopy Time: 9 minutes and 20  seconds. 173 mGy. COMPLICATIONS: None immediate. PROCEDURE: Informed consent was obtained from the patient following explanation of the procedure, risks, benefits and alternatives. The patient understands, agrees and consents for the procedure. All questions were addressed. A time out was performed prior to the initiation of the procedure. Maximal barrier sterile technique utilized including caps, mask, sterile gowns, sterile gloves, large sterile drape, hand hygiene, and chlorhexidine prep. Local anesthesia was provided with 1% lidocaine. Ultrasound was used to confirm patency of the right common femoral artery. A permanent ultrasound image was recorded. Under direct ultrasound guidance, a 21 gauge needle was advanced into the right common femoral artery. After access with a micropuncture set, a 5 French sheath was placed over a guidewire. A 5 French Cobra catheter was advanced into the abdominal aorta and used to selectively catheterize the celiac axis. The catheter was then further advanced into the common hepatic artery. Selective arteriography was performed at the level of the proximal common hepatic artery. The 5 French catheter was then used to selectively catheterize the proximal right hepatic artery over a guidewire. Selective arteriography was performed at the level of the right hepatic artery including rotational arteriography. A microcatheter was advanced through the 5 French catheter and used to selectively catheterize a more distal trunk of the right hepatic artery. Selective arteriography was performed through the microcatheter. Attempt was made to selectively catheterize the cystic artery utilizing a small caliber guidewire and microcatheter. A dose of 4.4 millicuries of technetium 99-m MAA was then administered through the microcatheter at the level of the right hepatic artery. Catheters were removed. Hemostasis was obtained at the level of the femoral access utilizing the Cordis ExoSeal device.  FINDINGS: Common hepatic arteriography demonstrates typical hepatic arterial anatomy without normal variant. Just beyond the gastroduodenal artery, the proper hepatic artery almost immediately bifurcates into right and left hepatic arteries. There are some tiny branches emanating from the distal proper hepatic artery/proper hepatic bifurcation that likely supply some small duodenal branches and a small right gastric artery. Right hepatic arteriography demonstrates an early branch that supplies a territory at the junction between right and left  lobes. A small cystic artery emanates from the distal right hepatic artery and extends inferiorly. Beyond the cystic artery, there is a dominant posterior and superiorly oriented hepatic artery branch supplying the region of known tumor in the posterior aspect of the right lobe of the liver seen by recent imaging with multiple tortuous and hypervascular branches present supplying a roughly oval area of increased arterial vascularity measuring roughly 5 cm in maximum diameter transversely. Dumbbell shaped area of arterial hypervascularity is also seen in the inferior tip of the right lobe of the liver supplied by inferior trunks of the right hepatic artery and suspicious for a smaller focus of hepatocellular carcinoma by prior imaging. There are also 2 small foci of increased arterial vascularity in the lateral aspect of the mid right lobe that could potentially represent small early foci of carcinoma. Based on arteriographic appearance, the entire right lobe will have to be treated with yttrium 90 microspheres. The cystic artery was attempted to be catheterized for prophylactic embolization but is very small and could not be selectively catheterized. There is room beyond the cystic artery to administer microspheres to the territories of abnormal right lobe vascularity. IMPRESSION: Hepatic arteriography performed in planning for right lobe yttrium 90 radioembolization. Abnormal  arterial tumor hypervascularity in the posterior and superior right lobe corresponds to the dominant area of hepatocellular carcinoma in the right lobe and measures roughly 5 cm in maximal diameter. There are 3 other smaller areas of abnormal arterial vascularity in the right lobe including a dumbbell-shaped region in the inferior tip of the liver which was appreciated on the recent CT study and 2 other smaller foci in the lateral aspect of the right lobe suspicious for early foci of carcinoma. The cystic artery could not be catheterized for prophylactic embolization. A dose of MAA was administered in the right hepatic artery at the level of planned treatment for further nuclear medicine imaging and lung shunt fraction calculation. Electronically Signed   By: Aletta Edouard M.D.   On: 12/16/2021 16:12   IR US Guide Vasc Access Right  Result Date: 12/16/2021 INDICATION: Hepatocellular carcinoma of the right lobe of the liver. Assessment by arteriography prior to planned yttrium 90 radioembolization. EXAM: 1. ULTRASOUND GUIDANCE FOR VASCULAR ACCESS OF THE RIGHT COMMON FEMORAL ARTERY 2. SELECTIVE VISCERAL ARTERIOGRAPHY WITH INITIAL SELECTIVE ARTERIOGRAPHY OF THE COMMON HEPATIC ARTERY 3. ADDITIONAL SELECTIVE ARTERIOGRAPHY OF THE RIGHT HEPATIC ARTERY 4. ADDITIONAL SELECTIVE ARTERIOGRAPHY OF A RIGHT HEPATIC ARTERY BRANCH MEDICATIONS: None ANESTHESIA/SEDATION: Moderate (conscious) sedation was employed during this procedure. A total of Versed 4.0 mg and Fentanyl 100 mcg was administered intravenously by radiology nursing. Moderate Sedation Time: 62 minutes. The patient's level of consciousness and vital signs were monitored continuously by radiology nursing throughout the procedure under my direct supervision. CONTRAST:  60 mL Omnipaque 300 FLUOROSCOPY TIME:  Fluoroscopy Time: 9 minutes and 20 seconds. 173 mGy. COMPLICATIONS: None immediate. PROCEDURE: Informed consent was obtained from the patient following  explanation of the procedure, risks, benefits and alternatives. The patient understands, agrees and consents for the procedure. All questions were addressed. A time out was performed prior to the initiation of the procedure. Maximal barrier sterile technique utilized including caps, mask, sterile gowns, sterile gloves, large sterile drape, hand hygiene, and chlorhexidine prep. Local anesthesia was provided with 1% lidocaine. Ultrasound was used to confirm patency of the right common femoral artery. A permanent ultrasound image was recorded. Under direct ultrasound guidance, a 21 gauge needle was advanced into the right common femoral  artery. After access with a micropuncture set, a 5 French sheath was placed over a guidewire. A 5 French Cobra catheter was advanced into the abdominal aorta and used to selectively catheterize the celiac axis. The catheter was then further advanced into the common hepatic artery. Selective arteriography was performed at the level of the proximal common hepatic artery. The 5 French catheter was then used to selectively catheterize the proximal right hepatic artery over a guidewire. Selective arteriography was performed at the level of the right hepatic artery including rotational arteriography. A microcatheter was advanced through the 5 French catheter and used to selectively catheterize a more distal trunk of the right hepatic artery. Selective arteriography was performed through the microcatheter. Attempt was made to selectively catheterize the cystic artery utilizing a small caliber guidewire and microcatheter. A dose of 4.4 millicuries of technetium 99-m MAA was then administered through the microcatheter at the level of the right hepatic artery. Catheters were removed. Hemostasis was obtained at the level of the femoral access utilizing the Cordis ExoSeal device. FINDINGS: Common hepatic arteriography demonstrates typical hepatic arterial anatomy without normal variant. Just beyond  the gastroduodenal artery, the proper hepatic artery almost immediately bifurcates into right and left hepatic arteries. There are some tiny branches emanating from the distal proper hepatic artery/proper hepatic bifurcation that likely supply some small duodenal branches and a small right gastric artery. Right hepatic arteriography demonstrates an early branch that supplies a territory at the junction between right and left lobes. A small cystic artery emanates from the distal right hepatic artery and extends inferiorly. Beyond the cystic artery, there is a dominant posterior and superiorly oriented hepatic artery branch supplying the region of known tumor in the posterior aspect of the right lobe of the liver seen by recent imaging with multiple tortuous and hypervascular branches present supplying a roughly oval area of increased arterial vascularity measuring roughly 5 cm in maximum diameter transversely. Dumbbell shaped area of arterial hypervascularity is also seen in the inferior tip of the right lobe of the liver supplied by inferior trunks of the right hepatic artery and suspicious for a smaller focus of hepatocellular carcinoma by prior imaging. There are also 2 small foci of increased arterial vascularity in the lateral aspect of the mid right lobe that could potentially represent small early foci of carcinoma. Based on arteriographic appearance, the entire right lobe will have to be treated with yttrium 90 microspheres. The cystic artery was attempted to be catheterized for prophylactic embolization but is very small and could not be selectively catheterized. There is room beyond the cystic artery to administer microspheres to the territories of abnormal right lobe vascularity. IMPRESSION: Hepatic arteriography performed in planning for right lobe yttrium 90 radioembolization. Abnormal arterial tumor hypervascularity in the posterior and superior right lobe corresponds to the dominant area of  hepatocellular carcinoma in the right lobe and measures roughly 5 cm in maximal diameter. There are 3 other smaller areas of abnormal arterial vascularity in the right lobe including a dumbbell-shaped region in the inferior tip of the liver which was appreciated on the recent CT study and 2 other smaller foci in the lateral aspect of the right lobe suspicious for early foci of carcinoma. The cystic artery could not be catheterized for prophylactic embolization. A dose of MAA was administered in the right hepatic artery at the level of planned treatment for further nuclear medicine imaging and lung shunt fraction calculation. Electronically Signed   By: Aletta Edouard M.D.   On:  12/16/2021 16:12   NM FUSION  Result Date: 12/16/2021 CLINICAL DATA:  Hepatocellular carcinoma with multifocal disease in the RIGHT hepatic lobe. Preradio embolization mapping. EXAM: NUCLEAR MEDICINE LIVER SCAN; ULTRASOUND MISCELLANEOUS SOFT TISSUE TECHNIQUE: Abdominal images were obtained in multiple projections after intrahepatic arterial injection of radiopharmaceutical. SPECT imaging was performed. Lung shunt calculation was performed. RADIOPHARMACEUTICALS:  4.54millicurie MAA TECHNETIUM TO 56M ALBUMIN AGGREGATED COMPARISON:  PET-CT scan 12/15/2021, CT 11/10/2021 FINDINGS: The injected microaggregated albumin localizes within the RIGHT hepatic lobe. No evidence of activity within the stomach, duodenum, or bowel. Calculated shunt fraction to the lungs equals 6.2%. IMPRESSION: 1. No significant extrahepatic radiotracer activity following intrahepatic arterial injection of MAA. 2. Lung shunt fraction equals 6.2% Electronically Signed   By: Suzy Bouchard M.D.   On: 12/16/2021 20:07      No orders of the defined types were placed in this encounter.  All questions were answered. The patient knows to call the clinic with any problems, questions or concerns. No barriers to learning was detected. The total time spent in the  appointment was 15 minutes.     Truitt Merle, MD 12/17/2021   I, Wilburn Mylar, am acting as scribe for Truitt Merle, MD.   I have reviewed the above documentation for accuracy and completeness, and I agree with the above.

## 2021-12-18 ENCOUNTER — Telehealth: Payer: BC Managed Care – PPO | Admitting: Hematology

## 2021-12-21 ENCOUNTER — Other Ambulatory Visit: Payer: Self-pay | Admitting: Radiology

## 2021-12-21 ENCOUNTER — Telehealth: Payer: Self-pay | Admitting: Hematology

## 2021-12-21 ENCOUNTER — Other Ambulatory Visit (HOSPITAL_COMMUNITY): Payer: Self-pay | Admitting: Physician Assistant

## 2021-12-21 NOTE — Telephone Encounter (Signed)
Left message with follow-up appointment per 1/19 los.

## 2021-12-22 ENCOUNTER — Encounter (HOSPITAL_COMMUNITY): Payer: Self-pay

## 2021-12-22 ENCOUNTER — Ambulatory Visit (HOSPITAL_COMMUNITY)
Admission: RE | Admit: 2021-12-22 | Discharge: 2021-12-22 | Disposition: A | Discharge status: Home / Self Care | Payer: BC Managed Care – PPO | Source: Ambulatory Visit | Attending: Interventional Radiology | Admitting: Interventional Radiology

## 2021-12-22 ENCOUNTER — Ambulatory Visit (HOSPITAL_COMMUNITY)
Admission: RE | Admit: 2021-12-22 | Discharge: 2021-12-22 | Disposition: A | Discharge status: Home / Self Care | Payer: BC Managed Care – PPO | Source: Ambulatory Visit | Attending: Radiology | Admitting: Radiology

## 2021-12-22 ENCOUNTER — Other Ambulatory Visit (HOSPITAL_COMMUNITY): Payer: Self-pay | Admitting: Radiology

## 2021-12-22 ENCOUNTER — Other Ambulatory Visit (HOSPITAL_COMMUNITY): Payer: Self-pay | Admitting: Interventional Radiology

## 2021-12-22 ENCOUNTER — Other Ambulatory Visit: Payer: Self-pay

## 2021-12-22 DIAGNOSIS — K766 Portal hypertension: Secondary | ICD-10-CM | POA: Insufficient documentation

## 2021-12-22 DIAGNOSIS — J439 Emphysema, unspecified: Secondary | ICD-10-CM | POA: Diagnosis not present

## 2021-12-22 DIAGNOSIS — J9811 Atelectasis: Secondary | ICD-10-CM | POA: Diagnosis not present

## 2021-12-22 DIAGNOSIS — I7300 Raynaud's syndrome without gangrene: Secondary | ICD-10-CM | POA: Insufficient documentation

## 2021-12-22 DIAGNOSIS — J90 Pleural effusion, not elsewhere classified: Secondary | ICD-10-CM | POA: Insufficient documentation

## 2021-12-22 DIAGNOSIS — C220 Liver cell carcinoma: Secondary | ICD-10-CM | POA: Diagnosis not present

## 2021-12-22 DIAGNOSIS — K7460 Unspecified cirrhosis of liver: Secondary | ICD-10-CM | POA: Diagnosis not present

## 2021-12-22 DIAGNOSIS — B182 Chronic viral hepatitis C: Secondary | ICD-10-CM | POA: Diagnosis not present

## 2021-12-22 DIAGNOSIS — R0600 Dyspnea, unspecified: Secondary | ICD-10-CM

## 2021-12-22 DIAGNOSIS — K589 Irritable bowel syndrome without diarrhea: Secondary | ICD-10-CM | POA: Diagnosis not present

## 2021-12-22 DIAGNOSIS — K219 Gastro-esophageal reflux disease without esophagitis: Secondary | ICD-10-CM | POA: Insufficient documentation

## 2021-12-22 DIAGNOSIS — Z8505 Personal history of malignant neoplasm of liver: Secondary | ICD-10-CM | POA: Diagnosis not present

## 2021-12-22 HISTORY — PX: IR THORACENTESIS ASP PLEURAL SPACE W/IMG GUIDE: IMG5380

## 2021-12-22 HISTORY — PX: IR EMBO TUMOR ORGAN ISCHEMIA INFARCT INC GUIDE ROADMAPPING: IMG5449

## 2021-12-22 HISTORY — PX: IR US GUIDE VASC ACCESS RIGHT: IMG2390

## 2021-12-22 HISTORY — PX: IR ANGIOGRAM VISCERAL SELECTIVE: IMG657

## 2021-12-22 HISTORY — PX: IR ANGIOGRAM SELECTIVE EACH ADDITIONAL VESSEL: IMG667

## 2021-12-22 LAB — COMPREHENSIVE METABOLIC PANEL
ALT: 17 U/L (ref 0–44)
AST: 36 U/L (ref 15–41)
Albumin: 3.4 g/dL — ABNORMAL LOW (ref 3.5–5.0)
Alkaline Phosphatase: 71 U/L (ref 38–126)
Anion gap: 7 (ref 5–15)
BUN: 5 mg/dL — ABNORMAL LOW (ref 6–20)
CO2: 27 mmol/L (ref 22–32)
Calcium: 8.2 mg/dL — ABNORMAL LOW (ref 8.9–10.3)
Chloride: 104 mmol/L (ref 98–111)
Creatinine, Ser: 0.54 mg/dL (ref 0.44–1.00)
GFR, Estimated: >60 mL/min (ref >60)
Glucose, Bld: 107 mg/dL — ABNORMAL HIGH (ref 70–99)
Potassium: 3.2 mmol/L — ABNORMAL LOW (ref 3.5–5.1)
Sodium: 138 mmol/L (ref 135–145)
Total Bilirubin: 1.6 mg/dL — ABNORMAL HIGH (ref 0.3–1.2)
Total Protein: 7.4 g/dL (ref 6.5–8.1)

## 2021-12-22 LAB — PROTIME-INR
INR: 1.3 — ABNORMAL HIGH (ref 0.8–1.2)
Prothrombin Time: 15.9 seconds — ABNORMAL HIGH (ref 11.4–15.2)

## 2021-12-22 LAB — CBC WITH DIFFERENTIAL/PLATELET
Abs Immature Granulocytes: 0.03 10*3/uL (ref 0.00–0.07)
Basophils Absolute: 0.1 10*3/uL (ref 0.0–0.1)
Basophils Relative: 1 %
Eosinophils Absolute: 0.1 10*3/uL (ref 0.0–0.5)
Eosinophils Relative: 2 %
HCT: 35.8 % — ABNORMAL LOW (ref 36.0–46.0)
Hemoglobin: 10.9 g/dL — ABNORMAL LOW (ref 12.0–15.0)
Immature Granulocytes: 1 %
Lymphocytes Relative: 14 %
Lymphs Abs: 0.9 10*3/uL (ref 0.7–4.0)
MCH: 24.7 pg — ABNORMAL LOW (ref 26.0–34.0)
MCHC: 30.4 g/dL (ref 30.0–36.0)
MCV: 81.2 fL (ref 80.0–100.0)
Monocytes Absolute: 0.5 10*3/uL (ref 0.1–1.0)
Monocytes Relative: 8 %
Neutro Abs: 4.9 10*3/uL (ref 1.7–7.7)
Neutrophils Relative %: 74 %
Platelets: 92 10*3/uL — ABNORMAL LOW (ref 150–400)
RBC: 4.41 MIL/uL (ref 3.87–5.11)
RDW: 20.3 % — ABNORMAL HIGH (ref 11.5–15.5)
WBC: 6.6 10*3/uL (ref 4.0–10.5)
nRBC: 0 % (ref 0.0–0.2)

## 2021-12-22 MED ORDER — SODIUM CHLORIDE 0.9 % IV SOLN
8.0000 mg | Freq: Once | INTRAVENOUS | Status: AC
  Administered 2021-12-22: 08:00:00 8 mg via INTRAVENOUS
  Filled 2021-12-22: qty 4

## 2021-12-22 MED ORDER — MIDAZOLAM HCL 2 MG/2ML IJ SOLN
INTRAMUSCULAR | Status: AC | PRN
  Administered 2021-12-22: 1 mg via INTRAVENOUS

## 2021-12-22 MED ORDER — SODIUM CHLORIDE 0.9 % IV SOLN
2.0000 g | Freq: Once | INTRAVENOUS | Status: AC
  Administered 2021-12-22: 10:00:00 2 g via INTRAVENOUS
  Filled 2021-12-22: qty 2

## 2021-12-22 MED ORDER — YTTRIUM 90 INJECTION
23.5000 | INJECTION | Freq: Once | INTRAVENOUS | Status: AC | PRN
  Administered 2021-12-22: 12:00:00 24.3 via INTRA_ARTERIAL

## 2021-12-22 MED ORDER — DEXAMETHASONE SODIUM PHOSPHATE 10 MG/ML IJ SOLN
8.0000 mg | Freq: Once | INTRAMUSCULAR | Status: AC
  Administered 2021-12-22: 08:00:00 8 mg via INTRAVENOUS
  Filled 2021-12-22: qty 1

## 2021-12-22 MED ORDER — IOHEXOL 300 MG/ML  SOLN
100.0000 mL | Freq: Once | INTRAMUSCULAR | Status: AC | PRN
  Administered 2021-12-22: 12:00:00 50 mL via INTRA_ARTERIAL

## 2021-12-22 MED ORDER — SODIUM CHLORIDE 0.9 % IV SOLN: INTRAVENOUS | Status: DC

## 2021-12-22 MED ORDER — MIDAZOLAM HCL 2 MG/2ML IJ SOLN
INTRAMUSCULAR | Status: AC
  Filled 2021-12-22: qty 4

## 2021-12-22 MED ORDER — IOHEXOL 300 MG/ML  SOLN
100.0000 mL | Freq: Once | INTRAMUSCULAR | Status: AC | PRN
  Administered 2021-12-22: 12:00:00 15 mL via INTRA_ARTERIAL

## 2021-12-22 MED ORDER — FENTANYL CITRATE (PF) 100 MCG/2ML IJ SOLN
INTRAMUSCULAR | Status: AC
  Filled 2021-12-22: qty 2

## 2021-12-22 MED ORDER — LIDOCAINE HCL 1 % IJ SOLN
INTRAMUSCULAR | Status: AC
  Filled 2021-12-22: qty 20

## 2021-12-22 MED ORDER — LIDOCAINE HCL (PF) 1 % IJ SOLN
INTRAMUSCULAR | Status: AC | PRN
  Administered 2021-12-22: 10 mL via INTRADERMAL

## 2021-12-22 MED ORDER — FENTANYL CITRATE (PF) 100 MCG/2ML IJ SOLN
INTRAMUSCULAR | Status: AC | PRN
  Administered 2021-12-22: 50 ug via INTRAVENOUS

## 2021-12-22 MED ORDER — PANTOPRAZOLE SODIUM 40 MG IV SOLR
40.0000 mg | Freq: Once | INTRAVENOUS | Status: AC
  Administered 2021-12-22: 08:00:00 40 mg via INTRAVENOUS
  Filled 2021-12-22: qty 40

## 2021-12-22 NOTE — Procedures (Signed)
Interventional Radiology Procedure Note  Procedure: Right hepatic arteriography and right hepatic Y-90 radioembolization  Complications: None  Estimated Blood Loss: < 10 mL  Findings: Right hepatic Y-90 radioembolization performed with delivery of 25 mCi dose of SIR Spheres. Dose delivered without complication.  Plan: 4 hour recovery followed by discharge. Clinic follow up in 2-4 weeks.  Theresa Rocha. Kathlene Cote, M.D Pager:  [phone]

## 2021-12-22 NOTE — H&P (Signed)
Referring Physician(s): Feng,Y  Supervising Physician: Aletta Edouard  Patient Status:  [facility]  Chief Complaint: Multifocal hepatocellular carcinoma   Subjective: Patient known to IR service from multiple thoracenteses as well as thermal ablation of a 1.8 cm left lobe [place] in 2022.  She has a history of chronic hepatitis C ,cirrhosis, esophageal varices with recent bleeding requiring transfusion in late November/early December 2022,  recurrent left pleural effusion after ablation procedure, prior breast cancer, COPD, GERD, IBS, portal hypertension, Raynaud's syndrome, thrombocytopenia. Current imaging suggests an enlarging malignant lesion in the posterior aspect of segment VII of the right lobe now measuring up to approximately 3.6 x 4 cm. Enhancement characteristics by MR and CT are now consistent with hepatocellular carcinoma. There is also an enlarging nodular lesion in the inferior tip of the right lobe in segment VI measuring 1 x 1.2 cm and consistent with HCC. Other small foci of arterial hyperenhancement also visible in segment VI. Enhancement along superior aspect of the left lobe ablation defect by CT is favored to be a perfusion defect and not recurrent malignancy, which is also supported by recent MRI.It now appears that she has multifocal [place] in the right lobe of the liver and the best treatment would be directed Y-90 radioembolization.  She presents today for the procedure. She currently denies fever, headache, chest pain, abdominal/back pain, nausea, vomiting or bleeding. She does have some dyspnea with exertion and occasional cough.  Past Medical History:  Diagnosis Date   Anemia    Breast cancer ([place]) 08/30/2019   Cancer ([place])    liver- just diagnosed, not started treatment yet   Cervicalgia    COPD (chronic obstructive pulmonary disease) (HCC)    Esophageal varices (HCC)    GERD (gastroesophageal reflux disease)    Headache    Hepatic cirrhosis (HCC)    Hepatitis C     resolved 2018   Hepatocellular carcinoma (HCC)    IBS (irritable bowel syndrome)    Lactose intolerance    Personal history of radiation therapy 11/2019   Right breast   Portal hypertension ([place])    Raynaud's syndrome    Thrombocytopenia ([place])    Past Surgical History:  Procedure Laterality Date   BREAST BIOPSY Right 09/2019   BREAST LUMPECTOMY Right 09/2019   BREAST LUMPECTOMY WITH AXILLARY LYMPH NODE BIOPSY Right 10/16/2019   Procedure: RIGHT BREAST LUMPECTOMY WITH SENTINEL LYMPH NODE BIOPSY;  Surgeon: Stark Klein, MD;  Location: [facility];  Service: General;  Laterality: Right;   COLONOSCOPY     ESOPHAGEAL BANDING  10/27/2021   Procedure: ESOPHAGEAL BANDING;  Surgeon: Irene Shipper, MD;  Location[facility];  Service: Endoscopy;;   ESOPHAGOGASTRODUODENOSCOPY (EGD) WITH PROPOFOL N/A 10/27/2021   Procedure: ESOPHAGOGASTRODUODENOSCOPY (EGD) WITH PROPOFOL;  Surgeon: Irene Shipper, MD;  [facility];  Service: Endoscopy;  Laterality: N/A;   HAMMER TOE SURGERY Left 2012   pins placed in great toe, 2nd,3rd, 4th toes, all pins removed except great toe   IR ANGIOGRAM SELECTIVE EACH ADDITIONAL VESSEL  12/16/2021   IR ANGIOGRAM SELECTIVE EACH ADDITIONAL VESSEL  12/16/2021   IR ANGIOGRAM VISCERAL SELECTIVE  12/16/2021   IR PARACENTESIS  11/13/2019   IR RADIOLOGIST EVAL & MGMT  08/16/2019   IR RADIOLOGIST EVAL & MGMT  02/06/2020   IR RADIOLOGIST EVAL & MGMT  06/26/2020   IR RADIOLOGIST EVAL & MGMT  12/31/2020   IR RADIOLOGIST EVAL & MGMT  07/21/2021   IR RADIOLOGIST EVAL & MGMT  10/08/2021  IR THORACENTESIS ASP PLEURAL SPACE W/IMG GUIDE  10/12/2021   IR THORACENTESIS ASP PLEURAL SPACE W/IMG GUIDE  10/26/2021   IR THORACENTESIS ASP PLEURAL SPACE W/IMG GUIDE  11/19/2021   IR US GUIDE VASC ACCESS RIGHT  12/16/2021   PELVIC LAPAROSCOPY Bilateral 1992   RADIOLOGY WITH ANESTHESIA N/A 09/16/2021   Procedure: CT MICROWAVE ABLATION;  Surgeon: Aletta Edouard, MD;  Location[facility];  Service:  Radiology;  Laterality: N/A;   UPPER GASTROINTESTINAL ENDOSCOPY  2002   had esophageal dilitation     Allergies: Hydromorphone hcl and Aspirin  Medications: Prior to Admission medications   Medication Sig Start Date End Date Taking? Authorizing Provider  dicyclomine (BENTYL) 20 MG tablet Take 0.5 tablets (10 mg total) by mouth 3 (three) times daily before meals. Take one tablet every 6-8 hours as needed for abdominal cramping 12/01/21  Yes Noralyn Pick, NP  eplerenone (INSPRA) 25 MG tablet Take 25 mg by mouth daily. 11/18/21  Yes [provider]  ibuprofen (ADVIL) 200 MG tablet Take 400 mg by mouth every 6 (six) hours as needed for mild pain.   Yes [provider]  loperamide (IMODIUM) 2 MG capsule Take 4 mg by mouth every other day.   Yes [provider]  midodrine (PROAMATINE) 2.5 MG tablet Take 2.5 mg by mouth 3 (three) times daily with meals.   Yes [provider]  nicotine (NICODERM CQ - DOSED IN MG/24 HOURS) 21 mg/24hr patch Place 21 mg onto the skin daily as needed (smoking cessation on work days).   Yes [provider]  ondansetron (ZOFRAN-ODT) 4 MG disintegrating tablet Dissolve one tablet on the tongue every 8 hours as needed for nausea and vomiting. 12/01/21  Yes Noralyn Pick, NP  pantoprazole (PROTONIX) 40 MG tablet Take 1 tablet (40 mg total) by mouth daily. 12/01/21  Yes Noralyn Pick, NP  potassium chloride (KLOR-CON) 10 MEQ tablet Take 30 mEq by mouth as needed. As directed 11/13/21  Yes [provider]  potassium chloride SA (KLOR-CON M) 20 MEQ tablet Take 1 tablet (20 mEq total) by mouth daily. 12/02/21 01/01/22 Yes Kennedy-Smith, Patrecia Pour, NP  sucralfate (CARAFATE) 1 GM/10ML suspension Take 10 mLs (1 g total) by mouth 2 (two) times daily. 11/02/21 12/22/21 Yes Amin, Jeanella Flattery, MD  tamoxifen (NOLVADEX) 20 MG tablet TAKE 1 TABLET BY MOUTH EVERY DAY Patient taking differently: Take 20 mg by mouth  every morning. 10/13/20  Yes Truitt Merle, MD  furosemide (LASIX) 40 MG tablet Take 1 tablet (40 mg total) by mouth daily. 11/02/21 12/16/21  Amin, Jeanella Flattery, MD  promethazine (PHENERGAN) 25 MG tablet Take 1 tablet (25 mg total) by mouth every 8 (eight) hours as needed for nausea or vomiting. Patient not taking: Reported on 12/01/2021 05/25/21   Edrick Kins, DPM  traZODone (DESYREL) 100 MG tablet Take 1 tablet (100 mg total) by mouth at bedtime as needed for sleep. 12/01/21   Truitt Merle, MD     Vital Signs: BP 119/78    Pulse 88    Temp 98.2 F (36.8 C) (Oral)    Resp 18    LMP 04/20/2013   Physical Exam awake, alert.  Chest with diminished breath sounds left base, right clear.  Heart with regular rate and rhythm.  Abdomen soft, positive bowel sounds, nontender.  No lower extremity edema.  Previous access site right common femoral artery soft, mildly tender to palpation, nodular scar noted, no ecchymosis  Imaging: No results found.  Labs:  CBC: Recent Labs    11/01/21 0508 12/01/21 1010 12/16/21 0750 12/22/21 0803  WBC 6.5 5.4 5.1 6.6  HGB 9.2* 10.4* 10.2* 10.9*  HCT 29.2* 32.8* 33.2* 35.8*  PLT 68* 93.0* 77* 92*    COAGS: Recent Labs    10/26/21 2001 10/27/21 0429 10/30/21 0055 10/31/21 0547 12/01/21 1010 12/16/21 0750  INR 1.8*   < > 1.5* 1.5* 1.3* 1.4*  APTT 31  --   --   --   --   --    < > = values in this interval not displayed.    BMP: Recent Labs    10/30/21 0055 10/31/21 0547 11/01/21 0508 12/01/21 1010 12/08/21 1427 12/16/21 0750  NA 132* 131* 133* 138 135 136  K 4.1 3.2* 3.8 2.9* 3.1* 3.7  CL 102 100 104 101 101 106  CO2 24 25 26 28 27 23   GLUCOSE 134* 80 118* 103* 200* 103*  BUN 8 10 10 10 7  5*  CALCIUM 7.0* 7.2* 7.7* 8.2* 8.2* 8.0*  CREATININE 0.61 0.59 0.60 0.65 0.63 0.58  GFRNONAA >60 >60 >60  --   --  >60    LIVER FUNCTION TESTS: Recent Labs    10/31/21 0547 11/01/21 0508 12/01/21 1010 12/16/21 0750  BILITOT 1.4* 1.0 1.3* 1.0  AST  31 29 29  35  ALT 15 15 13 17   ALKPHOS 49 43 83 69  PROT 5.2* 5.5* 6.7 6.4*  ALBUMIN 2.7* 2.8* 3.2* 2.9*    Assessment and Plan: Patient known to IR service from multiple thoracenteses as well as thermal ablation of a 1.8 cm left lobe [place] in 2022.  She has a history of chronic hepatitis C ,cirrhosis, esophageal varices with recent bleeding requiring transfusion in late November/early December 2022,  recurrent left pleural effusion after ablation procedure, prior breast cancer, COPD, GERD, IBS, portal hypertension, Raynaud's syndrome, thrombocytopenia. Current imaging suggests an enlarging malignant lesion in the posterior aspect of segment VII of the right lobe now measuring up to approximately 3.6 x 4 cm. Enhancement characteristics by MR and CT are now consistent with hepatocellular carcinoma. There is also an enlarging nodular lesion in the inferior tip of the right lobe in segment VI measuring 1 x 1.2 cm and consistent with HCC. Other small foci of arterial hyperenhancement also visible in segment VI. Enhancement along superior aspect of the left lobe ablation defect by CT is favored to be a perfusion defect and not recurrent malignancy, which is also supported by recent MRI.It now appears that she has multifocal [place] in the right lobe of the liver and the best treatment would be directed Y-90 radioembolization.  She presents today for the procedure.Risks and benefits of procedure were discussed with the patient including, but not limited to bleeding, infection, vascular injury or contrast induced renal failure.  This interventional procedure involves the use of X-rays and because of the nature of the planned procedure, it is possible that we will have prolonged use of X-ray fluoroscopy.  Potential radiation risks to you include (but are not limited to) the following: - A slightly elevated risk for cancer  several years later in life. This risk is typically less than 0.5% percent. This risk is low in  comparison to the normal incidence of human cancer, which is 33% for women and 50% for men according to the [place]. - Radiation induced injury can include skin redness, resembling a rash, tissue breakdown / ulcers and hair loss (which can be temporary or  permanent).   The likelihood of either of these occurring depends on the difficulty of the procedure and whether you are sensitive to radiation due to previous procedures, disease, or genetic conditions.   IF your procedure requires a prolonged use of radiation, you will be notified and given written instructions for further action.  It is your responsibility to monitor the irradiated area for the 2 weeks following the procedure and to notify your physician if you are concerned that you have suffered a radiation induced injury.    All of the patient's questions were answered, patient is agreeable to proceed.  Consent signed and in chart.  T bili today 1.6    Electronically Signed: D. Rowe Robert, PA-C 12/22/2021, 8:20 AM   I spent a total of 25 minutes at the the patient's bedside AND on the patient's hospital floor or unit, greater than 50% of which was counseling/coordinating care for hepatic/visceral arteriogram with Y-90 hepatic radioembolization

## 2021-12-22 NOTE — Discharge Instructions (Signed)
Please call Interventional Radiology clinic [phone] with any questions or concerns.  You may remove your dressing and shower tomorrow.    Hepatic Artery Radioembolization, Care After The following information offers guidance on how to care for yourself after your procedure. Your health care provider may also give you more specific instructions. If you have problems or questions, contact your health care provider. What can I expect after the procedure? After the procedure, it is possible to have: A slight fever for 7 to 10 days. This may be accompanied by pain, nausea, or vomiting, which is referred to as post-embolization syndrome. You may be given medicine to help relieve these symptoms. If your fever gets worse, tell your health care provider. Tiredness (fatigue). Loss of appetite. This should gradually improve after about 1 week. Abdominal pain on your right side. Soreness and tenderness in your groin area where the needle and catheter were placed (puncture site). Follow these instructions at home: Puncture site care Follow instructions from your health care provider about how to take care of the puncture site. Make sure you: Wash your hands with soap and water for at least 20 seconds before and after you change your bandage (dressing). If soap and water are not available, use hand sanitizer. Change your dressing as told by your health care provider. Check your puncture site every day for signs of infection. Check for: More redness, swelling, or pain. Fluid or blood. Warmth. Pus or a bad smell. Activity Rest as told by your health care provider. Return to your normal activities as told by your health care provider. Ask your health care provider what activities are safe for you. Avoid sitting for a long time without moving. Get up to take short walks every 1-2 hours. This is important to improve blood flow and breathing. Ask for help if you feel weak or unsteady. If you were given  a sedative during the procedure, it can affect you for several hours. Do not drive or operate machinery until your health care provider says that it is safe. Do not lift anything that is heavier than 10 lb (4.5 kg), or the limit that you are told, until your health care provider says that it is safe. Medicines Take over-the-counter and prescription medicines only as told by your health care provider. Ask your health care provider if the medicine prescribed to you: Requires you to avoid driving or using machinery. Can cause constipation. You may need to take these actions to prevent or treat constipation: Drink enough fluid to keep your urine pale yellow. Take over-the-counter or prescription medicines. Eat foods that are high in fiber, such as beans, whole grains, and fresh fruits and vegetables. Limit foods that are high in fat and processed sugars, such as fried or sweet foods. General instructions Eat frequent, small meals until your appetite returns. Follow instructions from your health care provider about eating or drinking restrictions. Do not take baths, swim, or use a hot tub until your health care provider approves. You may take showers. Wash your puncture site with mild soap and water, and pat the area dry. Wear compression stockings as told by your health care provider. These stockings help to prevent blood clots and reduce swelling in your legs. Keep all follow-up visits. This is important. You may need to have blood tests and imaging tests. Contact a health care provider if: You have any of these signs of infection: More redness, swelling, or pain around your puncture site. Fluid or blood coming from your  puncture site. Warmth coming from your puncture site. Pus or a bad smell coming from your puncture site. You have pain that: Gets worse. Does not get better with medicine. Feels like very bad heartburn. Is in the middle of your abdomen, above your belly button. You have any  signs of infection or liver failure, such as: Your skin or the white parts of your eyes turn yellow (jaundice). The color of your urine changes to dark brown. The color of your stool (feces) changes to light yellow. Your abdominal measurement (girth) increases in a short period of time. You gain more than 5 lb (2.3 kg) in a short period of time. Get help right away if: You have a fever that lasts more than 10 days or is higher than what your health care provider told you to expect. You develop any of the following in your legs: Pain. Swelling. Skin that is cold or pale or turns blue. You have chest pain. You have blood in your vomit, saliva, or stool. You have trouble breathing. These symptoms may represent a serious problem that is an emergency. Do not wait to see if the symptoms will go away. Get medical help right away. Call your local emergency services (911 in the [place] Do not drive yourself to the hospital. Summary After the procedure, it is possible to have a slight fever for up to 7-10 days, tiredness, loss of appetite, abdominal pain on the right side, and groin tenderness where the catheter was placed. Do not come in close contact with people for up to a week after your procedure, as told by your health care provider. Follow instructions from your health care provider about how to take care of the puncture site. Contact a health care provider if you have any signs of infection. Get help right away if you develop pain or swelling in your legs or if your legs feel cool or look pale. This information is not intended to replace advice given to you by your health care provider. Make sure you discuss any questions you have with your health care provider. Document Revised: 10/19/2020 Document Reviewed: 10/19/2020 Elsevier Patient Education  2022 [place].      Moderate Conscious Sedation, Adult, Care After This sheet gives you information about how to care for yourself after  your procedure. Your health care provider may also give you more specific instructions. If you have problems or questions, contact your health care provider. What can I expect after the procedure? After the procedure, it is common to have: Sleepiness for several hours. Impaired judgment for several hours. Difficulty with balance. Vomiting if you eat too soon. Follow these instructions at home: For the time period you were told by your health care provider: Rest. Do not participate in activities where you could fall or become injured. Do not drive or use machinery. Do not drink alcohol. Do not take sleeping pills or medicines that cause drowsiness. Do not make important decisions or sign legal documents. Do not take care of children on your own.      Eating and drinking Follow the diet recommended by your health care provider. Drink enough fluid to keep your urine pale yellow. If you vomit: Drink water, juice, or soup when you can drink without vomiting. Make sure you have little or no nausea before eating solid foods.   General instructions Take over-the-counter and prescription medicines only as told by your health care provider. Have a responsible adult stay with you for the time  you are told. It is important to have someone help care for you until you are awake and alert. Do not smoke. Keep all follow-up visits as told by your health care provider. This is important. Contact a health care provider if: You are still sleepy or having trouble with balance after 24 hours. You feel light-headed. You keep feeling nauseous or you keep vomiting. You develop a rash. You have a fever. You have redness or swelling around the IV site. Get help right away if: You have trouble breathing. You have new-onset confusion at home. Summary After the procedure, it is common to feel sleepy, have impaired judgment, or feel nauseous if you eat too soon. Rest after you get home. Know the things you  should not do after the procedure. Follow the diet recommended by your health care provider and drink enough fluid to keep your urine pale yellow. Get help right away if you have trouble breathing or new-onset confusion at home. This information is not intended to replace advice given to you by your health care provider. Make sure you discuss any questions you have with your health care provider. Document Revised: 03/14/2020 Document Reviewed: 10/11/2019 Elsevier Patient Education  [place].     Radiation precautions For up to a week after your procedure, there will be a small amount of radioactivity near your liver. This is not especially dangerous to other people. However, as told by your health care provider, you should follow these precautions for 7 days: Do not come in close contact with people. Do not sleep in the same bed as someone else. Do not hold children or babies. Do not have contact with pregnant women.  Post Y-90 Radioembolization Discharge Instructions  You have been given a radioactive material during your procedure.  While it is safe for you to be discharged home from the hospital, you need to proceed directly home.    Do not use public transportation, including air travel, lasting more than 2 hours for 1 week.  Avoid crowded public places for 1 week.  Adult visitors should try to avoid close contact with you for 1 week.    Children and pregnant females should not visit or have close contact with you for 1 week.  Items that you touch are not radioactive.  Do not sleep in the same bed as your partner for 1 week, and a condom should be used for sexual activity during the first 24 hours.  Your blood may be radioactive and caution should be used if any bleeding occurs during the recovery period.  Body fluids may be radioactive for 24 hours.  Wash your hands after voiding.  Men should sit to urinate.  Dispose of any soiled materials (flush down toilet or place  in trash at home) during the first day.  Drink 6 to 8 glasses of fluids per day for 5 days to hydrate yourself.  If you need to see a doctor during the first week, you must let them know that you were treated with yttrium-90 microspheres, and will be slightly radioactive.  They can call Interventional Radiology [phone] with any questions.

## 2021-12-22 NOTE — Sedation Documentation (Signed)
Writer transported patient to MI via stretcher in NAD. A&Ox4.

## 2021-12-22 NOTE — Procedures (Signed)
Ultrasound-guided  therapeutic left thoracentesis performed yielding 900 cc of yellow  fluid. No immediate complications. EBL none. Pt to undergo hepatic Y-90 radioembolization next and chest will be examined under fluoroscopy to assess for any complications.

## 2021-12-24 ENCOUNTER — Encounter (HOSPITAL_COMMUNITY): Payer: Self-pay | Admitting: Internal Medicine

## 2021-12-25 ENCOUNTER — Other Ambulatory Visit: Payer: Self-pay | Admitting: Nurse Practitioner

## 2021-12-28 ENCOUNTER — Telehealth: Payer: Self-pay | Admitting: Nurse Practitioner

## 2021-12-28 ENCOUNTER — Other Ambulatory Visit: Payer: Self-pay

## 2021-12-28 MED ORDER — POTASSIUM CHLORIDE CRYS ER 20 MEQ PO TBCR: 20.0000 meq | EXTENDED_RELEASE_TABLET | Freq: Two times a day (BID) | ORAL | 0 refills | Status: DC

## 2021-12-28 NOTE — Telephone Encounter (Signed)
Beth, pls contact Ms. Theresa Rocha and let her know her potassium level is still low at 3.2 on 12/22/2021. Pls have her take KCL 56meq tab two tabs po QD and recheck BMP in 1 week thereafter she should follow-up with her PCP to manage her hypokalemia and KCl Rx.  KCl  50meq tab Rx was recently renewed so she should use the supply she has for now.  Thank you  She is currently scheduled for an EGD for esophageal variceal banding at [facility] with Dr. Henrene Pastor on 01/04/2022.  Please make sure she has a chest x-ray done 2 to 3 days prior to her EGD date to assess her pleural effusions.  This was to be ordered at the time of her office visit 12/01/2021.  If her pleural effusions have worsened it is possible her EGD may need to be rescheduled.  She needs to contact our office if she has any significant changes in her health status prior to her EGD date.

## 2021-12-28 NOTE — Telephone Encounter (Signed)
Called the patient. No answer. Left information on her voice mail and asked she call back to speak with me.

## 2021-12-29 ENCOUNTER — Other Ambulatory Visit: Payer: Self-pay

## 2021-12-29 DIAGNOSIS — J90 Pleural effusion, not elsewhere classified: Secondary | ICD-10-CM

## 2021-12-29 NOTE — Telephone Encounter (Signed)
Called the patient. No answer. Left another message in her voicemail asking she call me back asap.

## 2021-12-29 NOTE — Telephone Encounter (Signed)
Patient calls back. She will come Friday for the labs. She wants to go to [facility] for the CXR. She asks that it be arranged that she can have her chest drained the same day if there is fluid build up again. Her EGD is on Monday 01/04/22.

## 2021-12-29 NOTE — Telephone Encounter (Signed)
Beth, thank you for the update.  If the chest x-ray shows she has reaccumulation of her pleural effusions, she will need to contact her pulmonary specialist as we are not managing her thoracentesis procedures.  Thank you

## 2021-12-30 ENCOUNTER — Ambulatory Visit: Payer: BC Managed Care – PPO | Admitting: Hematology

## 2021-12-30 ENCOUNTER — Other Ambulatory Visit: Payer: Self-pay | Admitting: Interventional Radiology

## 2021-12-30 ENCOUNTER — Other Ambulatory Visit: Payer: BC Managed Care – PPO

## 2021-12-30 DIAGNOSIS — J90 Pleural effusion, not elsewhere classified: Secondary | ICD-10-CM

## 2021-12-30 NOTE — Telephone Encounter (Signed)
Called the patient to discuss. No answer. Left the information on her voicemail. When she had made the request, I had asked her to contact whomever was ordering or managing that aspect of her care. She had indicated that she did not have a pulmonologist and she had chosen her team at IR.

## 2022-01-01 ENCOUNTER — Ambulatory Visit (HOSPITAL_COMMUNITY)
Admission: RE | Admit: 2022-01-01 | Discharge: 2022-01-01 | Disposition: A | Discharge status: Home / Self Care | Payer: BC Managed Care – PPO | Source: Ambulatory Visit | Attending: Interventional Radiology | Admitting: Interventional Radiology

## 2022-01-01 ENCOUNTER — Other Ambulatory Visit: Payer: Self-pay

## 2022-01-01 ENCOUNTER — Ambulatory Visit (HOSPITAL_COMMUNITY)
Admission: RE | Admit: 2022-01-01 | Discharge: 2022-01-01 | Disposition: A | Discharge status: Home / Self Care | Payer: BC Managed Care – PPO | Source: Ambulatory Visit | Attending: Nurse Practitioner | Admitting: Nurse Practitioner

## 2022-01-01 ENCOUNTER — Other Ambulatory Visit (INDEPENDENT_AMBULATORY_CARE_PROVIDER_SITE_OTHER): Payer: BC Managed Care – PPO

## 2022-01-01 DIAGNOSIS — Z8505 Personal history of malignant neoplasm of liver: Secondary | ICD-10-CM | POA: Insufficient documentation

## 2022-01-01 DIAGNOSIS — E876 Hypokalemia: Secondary | ICD-10-CM | POA: Diagnosis not present

## 2022-01-01 DIAGNOSIS — Z853 Personal history of malignant neoplasm of breast: Secondary | ICD-10-CM | POA: Diagnosis not present

## 2022-01-01 DIAGNOSIS — Z8719 Personal history of other diseases of the digestive system: Secondary | ICD-10-CM | POA: Diagnosis not present

## 2022-01-01 DIAGNOSIS — J90 Pleural effusion, not elsewhere classified: Secondary | ICD-10-CM

## 2022-01-01 LAB — BASIC METABOLIC PANEL
BUN: 6 mg/dL (ref 6–23)
CO2: 29 mEq/L (ref 19–32)
Calcium: 8.8 mg/dL (ref 8.4–10.5)
Chloride: 105 mEq/L (ref 96–112)
Creatinine, Ser: 0.57 mg/dL (ref 0.40–1.20)
GFR: 102.81 mL/min (ref >60.00)
Glucose, Bld: 94 mg/dL (ref 70–99)
Potassium: 4.4 mEq/L (ref 3.5–5.1)
Sodium: 138 mEq/L (ref 135–145)

## 2022-01-01 MED ORDER — LIDOCAINE HCL 1 % IJ SOLN
INTRAMUSCULAR | Status: AC
  Administered 2022-01-01: 10 mL
  Filled 2022-01-01: qty 20

## 2022-01-01 NOTE — Addendum Note (Signed)
Addended by: Virgina Evener A on: 01/01/2022 04:29 PM   Modules accepted: Orders

## 2022-01-04 ENCOUNTER — Ambulatory Visit (HOSPITAL_COMMUNITY): Payer: BC Managed Care – PPO | Admitting: Anesthesiology

## 2022-01-04 ENCOUNTER — Ambulatory Visit (HOSPITAL_COMMUNITY)
Admission: RE | Admit: 2022-01-04 | Discharge: 2022-01-04 | Disposition: A | Discharge status: Home / Self Care | Payer: BC Managed Care – PPO | Attending: Internal Medicine | Admitting: Internal Medicine

## 2022-01-04 ENCOUNTER — Other Ambulatory Visit: Payer: Self-pay

## 2022-01-04 ENCOUNTER — Encounter (HOSPITAL_COMMUNITY)
Admission: RE | Disposition: A | Discharge status: Home / Self Care | Payer: Self-pay | Source: Home / Self Care | Attending: Internal Medicine

## 2022-01-04 ENCOUNTER — Encounter (HOSPITAL_COMMUNITY): Payer: Self-pay | Admitting: Internal Medicine

## 2022-01-04 DIAGNOSIS — F17200 Nicotine dependence, unspecified, uncomplicated: Secondary | ICD-10-CM | POA: Diagnosis not present

## 2022-01-04 DIAGNOSIS — I8511 Secondary esophageal varices with bleeding: Secondary | ICD-10-CM

## 2022-01-04 DIAGNOSIS — D6959 Other secondary thrombocytopenia: Secondary | ICD-10-CM | POA: Diagnosis not present

## 2022-01-04 DIAGNOSIS — Z923 Personal history of irradiation: Secondary | ICD-10-CM | POA: Insufficient documentation

## 2022-01-04 DIAGNOSIS — I8510 Secondary esophageal varices without bleeding: Secondary | ICD-10-CM

## 2022-01-04 DIAGNOSIS — J449 Chronic obstructive pulmonary disease, unspecified: Secondary | ICD-10-CM | POA: Insufficient documentation

## 2022-01-04 DIAGNOSIS — K222 Esophageal obstruction: Secondary | ICD-10-CM | POA: Insufficient documentation

## 2022-01-04 DIAGNOSIS — K7460 Unspecified cirrhosis of liver: Secondary | ICD-10-CM | POA: Diagnosis not present

## 2022-01-04 DIAGNOSIS — K449 Diaphragmatic hernia without obstruction or gangrene: Secondary | ICD-10-CM | POA: Diagnosis not present

## 2022-01-04 DIAGNOSIS — Z8505 Personal history of malignant neoplasm of liver: Secondary | ICD-10-CM | POA: Insufficient documentation

## 2022-01-04 DIAGNOSIS — B182 Chronic viral hepatitis C: Secondary | ICD-10-CM | POA: Diagnosis not present

## 2022-01-04 DIAGNOSIS — K9189 Other postprocedural complications and disorders of digestive system: Secondary | ICD-10-CM | POA: Diagnosis not present

## 2022-01-04 DIAGNOSIS — Z8616 Personal history of COVID-19: Secondary | ICD-10-CM | POA: Insufficient documentation

## 2022-01-04 HISTORY — PX: ESOPHAGOGASTRODUODENOSCOPY (EGD) WITH PROPOFOL: SHX5813

## 2022-01-04 SURGERY — ESOPHAGOGASTRODUODENOSCOPY (EGD) WITH PROPOFOL
Anesthesia: Monitor Anesthesia Care

## 2022-01-04 MED ORDER — PROPOFOL 500 MG/50ML IV EMUL
INTRAVENOUS | Status: DC | PRN
  Administered 2022-01-04: 125 ug/kg/min via INTRAVENOUS

## 2022-01-04 MED ORDER — PROPOFOL 10 MG/ML IV BOLUS
INTRAVENOUS | Status: AC
  Filled 2022-01-04: qty 20

## 2022-01-04 MED ORDER — LIDOCAINE 2% (20 MG/ML) 5 ML SYRINGE
INTRAMUSCULAR | Status: DC | PRN
  Administered 2022-01-04: 100 mg via INTRAVENOUS

## 2022-01-04 MED ORDER — PROPOFOL 500 MG/50ML IV EMUL
INTRAVENOUS | Status: AC
  Filled 2022-01-04: qty 50

## 2022-01-04 MED ORDER — SODIUM CHLORIDE 0.9 % IV SOLN: INTRAVENOUS | Status: DC

## 2022-01-04 MED ORDER — LACTATED RINGERS IV SOLN: INTRAVENOUS | Status: DC

## 2022-01-04 MED ORDER — PROPOFOL 10 MG/ML IV BOLUS
INTRAVENOUS | Status: DC | PRN
  Administered 2022-01-04 (×3): 20 mg via INTRAVENOUS

## 2022-01-04 SURGICAL SUPPLY — 15 items

## 2022-01-04 NOTE — Anesthesia Postprocedure Evaluation (Signed)
Anesthesia Post Note  Patient: [name]  Procedure(s) Performed: ESOPHAGOGASTRODUODENOSCOPY (EGD) WITH PROPOFOL     Patient location during evaluation: Endoscopy Anesthesia Type: MAC Level of consciousness: awake Pain management: pain level controlled Vital Signs Assessment: post-procedure vital signs reviewed and stable Respiratory status: spontaneous breathing Cardiovascular status: stable Postop Assessment: no apparent nausea or vomiting Anesthetic complications: no   No notable events documented.  Last Vitals:  Vitals:   01/04/22 0931 01/04/22 0940  BP: 102/74 114/70  Pulse: 81   Resp: 16   Temp:    SpO2: 100%     Last Pain:  Vitals:   01/04/22 0940  TempSrc:   PainSc: 0-No pain                 [name]

## 2022-01-04 NOTE — Discharge Instructions (Signed)
YOU HAD AN ENDOSCOPIC PROCEDURE TODAY: Refer to the procedure report and other information in the discharge instructions given to you for any specific questions about what was found during the examination. If this information does not answer your questions, please call [place] office at [phone] to clarify.  ° °YOU SHOULD EXPECT: Some feelings of bloating in the abdomen. Passage of more gas than usual. Walking can help get rid of the air that was put into your GI tract during the procedure and reduce the bloating. If you had a lower endoscopy (such as a colonoscopy or flexible sigmoidoscopy) you may notice spotting of blood in your stool or on the toilet paper. Some abdominal soreness may be present for a day or two, also. ° °DIET: Your first meal following the procedure should be a light meal and then it is ok to progress to your normal diet. A half-sandwich or bowl of soup is an example of a good first meal. Heavy or fried foods are harder to digest and may make you feel nauseous or bloated. Drink plenty of fluids but you should avoid alcoholic beverages for 24 hours. If you had a esophageal dilation, please see attached instructions for diet.   ° °ACTIVITY: Your care partner should take you home directly after the procedure. You should plan to take it easy, moving slowly for the rest of the day. You can resume normal activity the day after the procedure however YOU SHOULD NOT DRIVE, use power tools, machinery or perform tasks that involve climbing or major physical exertion for 24 hours (because of the sedation medicines used during the test).  ° °SYMPTOMS TO REPORT IMMEDIATELY: °A gastroenterologist can be reached at any hour. Please call [phone]  for any of the following symptoms:  °Following lower endoscopy (colonoscopy, flexible sigmoidoscopy) °Excessive amounts of blood in the stool  °Significant tenderness, worsening of abdominal pains  °Swelling of the abdomen that is new, acute  °Fever of 100° or  higher  °Following upper endoscopy (EGD, EUS, ERCP, esophageal dilation) °Vomiting of blood or coffee ground material  °New, significant abdominal pain  °New, significant chest pain or pain under the shoulder blades  °Painful or persistently difficult swallowing  °New shortness of breath  °Black, tarry-looking or red, bloody stools ° °FOLLOW UP:  °If any biopsies were taken you will be contacted by phone or by letter within the next 1-3 weeks. Call [phone]  if you have not heard about the biopsies in 3 weeks.  °Please also call with any specific questions about appointments or follow up tests. ° °

## 2022-01-04 NOTE — Anesthesia Procedure Notes (Signed)
Date/Time: 01/04/2022 9:06 AM Performed by: Sharlette Dense, CRNA Oxygen Delivery Method: Simple face mask

## 2022-01-04 NOTE — Anesthesia Preprocedure Evaluation (Addendum)
Anesthesia Evaluation  Patient identified by MRN, date of birth, ID band Patient awake    Reviewed: Allergy & Precautions, NPO status , Patient's Chart, lab work & pertinent test results  Airway Mallampati: II  TM Distance: >3 FB     Dental   Pulmonary COPD, Current Smoker and Patient abstained from smoking.,    breath sounds clear to auscultation       Cardiovascular  Rhythm:Regular Rate:Normal     Neuro/Psych  Headaches,    GI/Hepatic GERD  ,(+) Hepatitis -  Endo/Other    Renal/GU      Musculoskeletal   Abdominal   Peds  Hematology   Anesthesia Other Findings   Reproductive/Obstetrics                             Anesthesia Physical Anesthesia Plan  ASA: 3  Anesthesia Plan:    Post-op Pain Management:    Induction: Intravenous  PONV Risk Score and Plan: 1 and Propofol infusion  Airway Management Planned: Simple Face Mask  Additional Equipment:   Intra-op Plan:   Post-operative Plan:   Informed Consent: I have reviewed the patients History and Physical, chart, labs and discussed the procedure including the risks, benefits and alternatives for the proposed anesthesia with the patient or authorized representative who has indicated his/her understanding and acceptance.     Dental advisory given  Plan Discussed with: CRNA and Anesthesiologist  Anesthesia Plan Comments:         Anesthesia Quick Evaluation

## 2022-01-04 NOTE — Transfer of Care (Signed)
Immediate Anesthesia Transfer of Care Note  Patient: NAKOTA ACKERT  Procedure(s) Performed: ESOPHAGOGASTRODUODENOSCOPY (EGD) WITH PROPOFOL  Patient Location: Endoscopy Unit  Anesthesia Type:MAC  Level of Consciousness: awake  Airway & Oxygen Therapy: Patient Spontanous Breathing and Patient connected to face mask oxygen  Post-op Assessment: Report given to RN and Post -op Vital signs reviewed and stable  Post vital signs: Reviewed and stable  Last Vitals:  Vitals Value Taken Time  BP 91/59 01/04/22 0918  Temp    Pulse 89 01/04/22 0919  Resp 15 01/04/22 0919  SpO2 100 % 01/04/22 0919  Vitals shown include unvalidated device data.  Last Pain:  Vitals:   01/04/22 0759  TempSrc: Oral  PainSc: 0-No pain         Complications: No notable events documented.

## 2022-01-04 NOTE — Op Note (Signed)
[facility] Patient Name: Theresa Rocha Procedure Date: 01/04/2022 MRN: [id number] Attending MD: Docia Chuck. Henrene Pastor , MD Date of Birth: [birth year] CSN: [id number] Age: 56 Admit Type: Outpatient Procedure:                Upper GI endoscopy Indications:              Follow-up of esophageal varices Providers:                Docia Chuck. Henrene Pastor, MD, Dulcy Fanny, Theodora Blow,                            Technician Referring MD:             Dr. Delfina Redwood Medicines:                Monitored Anesthesia Care Complications:            No immediate complications. Estimated Blood Loss:     Estimated blood loss: none. Procedure:                Pre-Anesthesia Assessment:                           - Prior to the procedure, a History and Physical                            was performed, and patient medications and                            allergies were reviewed. The patient's tolerance of                            previous anesthesia was also reviewed. The risks                            and benefits of the procedure and the sedation                            options and risks were discussed with the patient.                            All questions were answered, and informed consent                            was obtained. Prior Anticoagulants: The patient has                            taken no previous anticoagulant or antiplatelet                            agents. ASA Grade Assessment: III - A patient with                            severe systemic disease. After reviewing the risks  and benefits, the patient was deemed in                            satisfactory condition to undergo the procedure.                           After obtaining informed consent, the endoscope was                            passed under direct vision. Throughout the                            procedure, the patient's blood pressure, pulse, and                            oxygen  saturations were monitored continuously. The                            [id number]-[id number] ([id number]) Olympus endoscope was introduced                            through the mouth, and advanced to the second part                            of duodenum. The upper GI endoscopy was                            accomplished without difficulty. The patient                            tolerated the procedure well. Scope In: Scope Out: Findings:      The esophagus revealed scarring in the distal portion from prior band       placement. Incidental distal esophageal ring also noted. No meaningful       residual varices.      The stomach was normal, save hiatal hernia. No proximal gastric varices.      The examined duodenum was normal.      The cardia and gastric fundus were normal on retroflexion. Impression:               1. Esophageal scarring without residual varices                           2. Incidental esophageal ring                           3. Small hiatal hernia. Otherwise normal EGD. Moderate Sedation:      none Recommendation:           - Patient has a contact number available for                            emergencies. The signs and symptoms of potential                            delayed complications were discussed  with the                            patient. Return to normal activities tomorrow.                            Written discharge instructions were provided to the                            patient.                           - Resume previous diet.                           - Continue present medications.                           - Repeat EGD for surveillance in 6 to 12 months Procedure Code(s):        --- Professional ---                           [phone], Esophagogastroduodenoscopy, flexible,                            transoral; diagnostic, including collection of                            specimen(s) by brushing or washing, when performed                            (separate  procedure) Diagnosis Code(s):        --- Professional ---                           I85.00, Esophageal varices without bleeding CPT copyright 2019 American Medical Association. All rights reserved. The codes documented in this report are preliminary and upon coder review may  be revised to meet current compliance requirements. Docia Chuck. Henrene Pastor, MD 01/04/2022 9:20:25 AM This report has been signed electronically. Number of Addenda: 0

## 2022-01-04 NOTE — H&P (Signed)
Chief Complaint: Cirrhosis, hospital follow up   History of Present Illness: Theresa Rocha, Theresa Rocha is a 56 year old female with a past medical history of breast cancer s/p lumpectomy and radiation 2020, decompensated cirrhosis secondary to chronic hepatitis C and alcohol use disorder with esophageal varices and hepatocellular carcinoma s/p microwave ablation to the dominant hepatic lesion in the left lobe and she has a second 2.5 cm right liver lesion and several smaller hepatic lesions followed by IR and oncology. Recurrent left pleural effusion.    She was admitted to the hospital 10/26/2021 with an upper GI bleed with hematemesis and melena. Admission Hg was 8.3 (Hg 8.7 08/2021). She was placed on Octreotide, PPI infusion and she received 2 units of PRBCs and 1 unit of FFP. Post transfusion Hg 9.1. CTAP showed a moderate left pleural effusion, mild thickening of the walls of the entire colon and distal small bowel, prominent esophageal varices at the level of the lower esophagus incompletely imaged, cirrhotic liver, known neoplastic lesions in the liver, for centimeter lesion in the left lobe of the liver, additional hypodense lesion within the posterior right liver lobe measuring 2.5 cm compatible with another known neoplastic lesion likely enlarged compared to MRI from 07/17/2021, additional smaller lesions scattered within the liver.   She has recurrent left pleural effusion after ablation initially thought to be sympathetic effusion related to diaphragmatic irritation as her microwave ablation was just below the left hemidiaphragm. She required several thoracentesis with the most recent on 10/01/2021 (1.2 L pleural fluid removed), 11/14 (1.8L pleural fluid removed), 10/26/2021 (700cc pleural fluid removed), 11/30 (1.6L pleural fluid removed) and 11/19/2021 1L pleural fluid removed. Cytology showed reactive mesothelial cells present, no malignant cells. No evidence of spontaneous bacterial pleuritis.  She is on  Lasix 40 mg daily.  She is not on Aldactone which resulted in diarrhea and nausea taken in the past.   TIPS was discussed with the patient by Dr. Kathlene Cote, given its potential impact on treatment of her [place], no further bleeding after banding and improvement in pleural effusion decision was made to defer TIPS at this time.   She intends to return to her part-time employment in 1 week.  Her energy level is slowly improving.  She has lost 5 to 6 pounds over the past few weeks.  She took Imodium due to having her typical IBS diarrhea 2 days ago.  She developed mild nausea after taking Imodium without vomiting.  No BM in the past 2 days.  She stated this is her typical IBS pattern.  She remains on Pantoprazole 40 mg daily.  No heartburn or upper abdominal pain.  No melena or rectal bleeding.  No chest pain or shortness of breath.   EGD 10/27/2021 by Dr. Henrene Pastor: There were 3 columns of grade II varices were found in the lower third of the esophagus. One of the varix was slightly more prominent and had stigmata (red pigmentation/nipple). There was no active bleeding. After completing the entire endoscopic survey, Three bands were successfully placed on the high risk varix with incomplete eradication of varices overall. There was no bleeding during the procedure.   There was an incidental ringlike esophageal stricture at the gastroesophageal junction. There was mild inflammation in the pyloric region. The stomach was otherwise normal with no evidence of proximal gastric varices or significant portal hypertensive gastropathy. The examined duodenum was normal. The cardia and gastric fundus were normal on retroflexion save small hiatal hernia. 1. Recent upper GI bleed secondary to  esophageal varices status post band ligation 2. Incidental distal esophageal ring 3. Mild pyloric inflammation 4. Otherwise normal EGD.   Abdominal MRI w/wo contrast 10/29/2021: 1. Ablation defect of the left lobe of the liver  which subtends two lesions previously noted in hepatic segment II. No evidence of residual contrast enhancement to suggest viable tumor. 2. Significant interval increase in size of a hyperenhancing lesion of the posterior liver dome, hepatic segment VII, measuring 4.0 x 3.7 cm, previously 2.6 x 2.5 cm when measured similarly. This demonstrates some heterogeneous contrast enhancement although no clear evidence of washout or capsular enhancement. Findings remain LI-RADS category 5, consistent with hepatocellular carcinoma. 3. Slight interval increase in size of a hyperenhancing lesion of the inferior tip of the right lobe of the liver, hepatic segment VI, measuring 1.1 x 0.7 cm, previously no greater than 0.6 cm, as well as an additional adjacent lesion measuring 0.6 cm, previously 0.4 cm. Given threshold growth, these lesions are upstaged to LI-RADS category 4 and suspicious for additional small foci of hepatocellular carcinoma. 4. Cirrhotic morphology of the liver and hepatic steatosis. 5. Small volume ascites throughout the abdomen. 6. Large left, small right pleural effusions and associated atelectasis or consolidation. 7. Layering sludge in the gallbladder with mild gallbladder wall thickening and pericholecystic fluid, similar to prior examination although nonspecific in the setting of ascites. No discrete gallstones.   CTAP w/wo contrast 11/10/2021: Post treatment changes about the LEFT hepatic lobe lesion with variable arterial enhancement not showing washout and with non masslike features, favor perfusional defect, characterized as TR equivocal but favor nonviable.   Lesion in the posterior RIGHT hepatic lobe over time with enlargement and with serpiginous internal low signal on MR and low attenuation on CT. Findings categorized as LR M given signs of rim like enhancement on previous imaging and infiltrative appearance. Potentially infiltrative HCC but without specific  imaging features of hepatocellular carcinoma by LI-RADS.   Enlarging lesion in the inferior RIGHT hepatic lobe greater than 50% diameter increase over 6 month interval, LI-RADS category 5 showing non rim arterial phase enhancement.   Additional foci of arterial phase enhancement without definitive washout, categorized as LI-RADS category 3 but in aggregate are more suspicious for small foci of hepatocellular carcinoma, attention on follow-up.   ECHO 10/29/2021: Left ventricular ejection fraction, by estimation, is 60 to 65%. The left ventricle has normal function. The left ventricle has no regional wall motion abnormalities. Left ventricular diastolic parameters were normal. 1. Right ventricular systolic function is normal. The right ventricular size is normal. Tricuspid regurgitation signal is inadequate for assessing PA pressure. 2. 3. Moderate pleural effusion in the left lateral region. The mitral valve is normal in structure. No evidence of mitral valve regurgitation. No evidence of mitral stenosis. 4. The aortic valve is tricuspid. Aortic valve regurgitation is not visualized. No aortic stenosis is present. 5. The inferior vena cava is normal in size with greater than 50% respiratory variability, suggesting right atrial pressure of 3 mmHg.         Current Outpatient Medications on File Prior to Visit  Medication Sig Dispense Refill   eplerenone (INSPRA) 25 MG tablet Take 25 mg by mouth daily.       furosemide (LASIX) 40 MG tablet Take 1 tablet (40 mg total) by mouth daily. 30 tablet 0   ibuprofen (ADVIL) 200 MG tablet Take 400 mg by mouth every 6 (six) hours as needed for mild pain.       loperamide (IMODIUM) 2  MG capsule Take 4 mg by mouth every other day.       midodrine (PROAMATINE) 10 MG tablet Take 1 tablet (10 mg total) by mouth 3 (three) times daily with meals. 90 tablet 0   nicotine (NICODERM CQ - DOSED IN MG/24 HOURS) 21 mg/24hr patch Place 21 mg onto the skin  daily as needed (smoking cessation on work days).       potassium chloride (KLOR-CON) 10 MEQ tablet Take 30 mEq by mouth as needed. As directed       sucralfate (CARAFATE) 1 GM/10ML suspension Take 10 mLs (1 g total) by mouth 2 (two) times daily. 600 mL 0   tamoxifen (NOLVADEX) 20 MG tablet TAKE 1 TABLET BY MOUTH EVERY DAY (Patient taking differently: Take 20 mg by mouth every morning.) 90 tablet 1   promethazine (PHENERGAN) 25 MG tablet Take 1 tablet (25 mg total) by mouth every 8 (eight) hours as needed for nausea or vomiting. (Patient not taking: Reported on 12/01/2021) 20 tablet 0    No current facility-administered medications on file prior to visit.         Allergies  Allergen Reactions   Hydromorphone Hcl Nausea And Vomiting      Severe vomiting   Aspirin Nausea Only    Current Medications, Allergies, Past Medical History, Past Surgical History, Family History and Social History were reviewed in [employer] record.   Review of Systems:   Constitutional: See HPI Respiratory: See HPI. Cardiovascular: Negative for chest pain, palpitations and leg swelling.  Gastrointestinal: See HPI.  Musculoskeletal: Negative for back pain or muscle aches.  Neurological: Negative for dizziness, headaches or paresthesias.    Physical Exam: LMP 04/20/2013  General: 56 year old female in no acute distress. Head: Normocephalic and atraumatic. Eyes: No scleral icterus. Conjunctiva pink . Ears: Normal auditory acuity. Mouth: Dentition intact. No ulcers or lesions.  Lungs: Clear throughout to auscultation. Heart: Regular rate and rhythm, no murmur. Abdomen: Soft, nontender and nondistended.  No ascites.  No masses or hepatomegaly. Normal bowel sounds x 4 quadrants.  Rectal: Deferred. Musculoskeletal: Symmetrical with no gross deformities. Extremities: No edema. Neurological: Alert oriented x 4. No focal deficits.  No asterixis. Psychological: Alert and cooperative. Normal  mood and affect   Assessment and Recommendations:   67) 56 year old female with decompensated cirrhosis secondary to hepatitis C (s/p treatment with SVR) and alcohol use disorder) with esophageal varices, portal hypertensive gastropathy and progressive multifocal HCC s/p microwave ablation10/2022. MELD 11. She is not a transplant candidate due to history of invasive breast cancer and multifocal HCC. -Continue follow up with Roosevelt Locks NP at [place] follow up with Dr. Kathlene Cote, planning on Y-90 radioembolization to the multifocal [facility] in the right liver lobe. Holding off on TIPS until [facility] is treated. -CBC, CMP and INR -2 g low-sodium diet -Recommended protein 1.2 to 1.5 gm/kg daily, consider nutritionist consult    2) Anemia. Admitted to the hospital 09/2021 with UGI bleed/melena/hematemesis. Admission Hg was 8.3 (Hg 8.7 08/2021). She was placed on Octreotide, PPI infusion and she received 2 units of PRBCs and 1 unit of FFP. Post transfusion Hg 9.1. S/P EGD 10/28/2021 showed 3 columns of grade II varices were found in the lower third of the esophagus, 3 bands were successfully place without further GI bleeding -CBC, iron panel, PT/INR. CMP -Repeat EGD with band ligation at [facility], EGD benefits and risks discussed including risk with sedation, risk of bleeding, perforation  and infection  -Consider TIPS if variceal bleed recurs (IR recommended holding off on TIPS until [facility] treated) -Patient to contact her office if she develops hematemesis black stool or rectal bleed -Continue Pantoprazole 40 mg daily -Ondansetron 4 mg ODT every 6 hours as needed   3) Thrombocytopenia secondary to cirrhosis/mild splenomegaly    4) Recurrent left pleural effusion, query hepatic hydrothorax. No plans for TIPS at this time.  -Continue Furosemide 40mg  daily. Started on Eplerenone 25mg  QD per Roosevelt Locks NP. -Beta blockers not recommended  -Check chest xray pa/lat 2 days prior to  EGD date to assess status of left pleural effusion   5) Covid 19+ 10/30/2021 during her  hospital admission    The patient presents today for esophageal variceal surveillance after prior banding for bleeding.  She has had no interval change since the complete and comprehensive history and physical as outlined above.  She tells me today that she is comfortable and has had nothing to suggest recurrent bleeding.

## 2022-01-07 ENCOUNTER — Other Ambulatory Visit: Payer: Self-pay | Admitting: Nurse Practitioner

## 2022-01-08 DIAGNOSIS — K7460 Unspecified cirrhosis of liver: Secondary | ICD-10-CM | POA: Diagnosis not present

## 2022-01-08 DIAGNOSIS — I8511 Secondary esophageal varices with bleeding: Secondary | ICD-10-CM | POA: Diagnosis not present

## 2022-01-08 DIAGNOSIS — E876 Hypokalemia: Secondary | ICD-10-CM | POA: Diagnosis not present

## 2022-01-08 DIAGNOSIS — C220 Liver cell carcinoma: Secondary | ICD-10-CM | POA: Diagnosis not present

## 2022-01-13 ENCOUNTER — Ambulatory Visit
Admission: RE | Admit: 2022-01-13 | Discharge: 2022-01-13 | Disposition: A | Discharge status: Home / Self Care | Payer: BC Managed Care – PPO | Source: Ambulatory Visit | Attending: Interventional Radiology | Admitting: Interventional Radiology

## 2022-01-13 ENCOUNTER — Other Ambulatory Visit: Payer: Self-pay | Admitting: Nurse Practitioner

## 2022-01-13 ENCOUNTER — Encounter: Payer: Self-pay | Admitting: *Deleted

## 2022-01-13 DIAGNOSIS — C220 Liver cell carcinoma: Secondary | ICD-10-CM | POA: Diagnosis not present

## 2022-01-13 DIAGNOSIS — Z9889 Other specified postprocedural states: Secondary | ICD-10-CM | POA: Diagnosis not present

## 2022-01-13 HISTORY — PX: IR RADIOLOGIST EVAL & MGMT: IMG5224

## 2022-01-13 NOTE — Progress Notes (Signed)
Chief Complaint: Patient was seen in consultation today for follow-up after yttrium-90 radioembolization of the right lobe of the liver on 12/22/2021 to treat hepatocellular carcinoma.  History of Present Illness: Theresa Rocha is a 56 y.o. female with a history of chronic hepatitis C and cirrhosis who is status post microwave thermal ablation of a 1.8 cm left lobe hepatocellular carcinoma on 09/16/2021.  She is now also status post yttrium-90 radioembolization to the right lobe of the liver on 12/22/2021 with delivery of a 25 mCi dose of Y-90 SIR Spheres via the right hepatic artery to treat worsening multifocal hepatocellular carcinoma in the right lobe of the liver.  She had fatigue for a couple weeks following treatment which has improved and she has been able to return to work.  She states that she still has decreased energy and appetite but is currently not losing weight.  She is trying different means of nutritional supplementation.  Recently, a follow-up upper endoscopy was performed by Dr. Henrene Pastor on 01/04/2022 that demonstrated esophageal scarring and no residual esophageal varices.  A left thoracentesis was performed on 01/01/2022 just prior to endoscopy due to the fact that she was going to be sedated which yielded 500 mL of fluid.  Frequency and volume yield of thoracentesis has decreased since more aggressive diuretic management.  She is scheduled to follow-up with [facility] next March and [name] in May.  Past Medical History:  Diagnosis Date   Anemia    Breast cancer ([place]) 08/30/2019   Cancer ([place])    liver- just diagnosed, not started treatment yet   Cervicalgia    COPD (chronic obstructive pulmonary disease) (HCC)    Esophageal varices (HCC)    GERD (gastroesophageal reflux disease)    Headache    Hepatic cirrhosis (HCC)    Hepatitis C    resolved 2018   Hepatocellular carcinoma (HCC)    IBS (irritable bowel syndrome)    Lactose intolerance    Personal history of  radiation therapy 11/2019   Right breast   Portal hypertension ([place])    Raynaud's syndrome    Thrombocytopenia ([place])     Past Surgical History:  Procedure Laterality Date   BREAST BIOPSY Right 09/2019   BREAST LUMPECTOMY Right 09/2019   BREAST LUMPECTOMY WITH AXILLARY LYMPH NODE BIOPSY Right 10/16/2019   Procedure: RIGHT BREAST LUMPECTOMY WITH SENTINEL LYMPH NODE BIOPSY;  Surgeon: Stark Klein, MD;  Location: [facility];  Service: General;  Laterality: Right;   COLONOSCOPY     ESOPHAGEAL BANDING  10/27/2021   Procedure: ESOPHAGEAL BANDING;  Surgeon: Irene Shipper, MD;  Location: [facility];  Service: Endoscopy;;   ESOPHAGOGASTRODUODENOSCOPY (EGD) WITH PROPOFOL N/A 10/27/2021   Procedure: ESOPHAGOGASTRODUODENOSCOPY (EGD) WITH PROPOFOL;  Surgeon: Irene Shipper, MD;  Location: [facility];  Service: Endoscopy;  Laterality: N/A;   ESOPHAGOGASTRODUODENOSCOPY (EGD) WITH PROPOFOL N/A 01/04/2022   Procedure: ESOPHAGOGASTRODUODENOSCOPY (EGD) WITH PROPOFOL;  Surgeon: Irene Shipper, MD;  Location: [facility];  Service: Endoscopy;  Laterality: N/A;   HAMMER TOE SURGERY Left 2012   pins placed in great toe, 2nd,3rd, 4th toes, all pins removed except great toe   IR ANGIOGRAM SELECTIVE EACH ADDITIONAL VESSEL  12/16/2021   IR ANGIOGRAM SELECTIVE EACH ADDITIONAL VESSEL  12/16/2021   IR ANGIOGRAM SELECTIVE EACH ADDITIONAL VESSEL  12/22/2021   IR ANGIOGRAM VISCERAL SELECTIVE  12/16/2021   IR ANGIOGRAM VISCERAL SELECTIVE  12/22/2021   IR EMBO TUMOR ORGAN ISCHEMIA INFARCT INC GUIDE ROADMAPPING  12/22/2021   IR PARACENTESIS  11/13/2019   IR RADIOLOGIST EVAL & MGMT  08/16/2019   IR RADIOLOGIST EVAL & MGMT  02/06/2020   IR RADIOLOGIST EVAL & MGMT  06/26/2020   IR RADIOLOGIST EVAL & MGMT  12/31/2020   IR RADIOLOGIST EVAL & MGMT  07/21/2021   IR RADIOLOGIST EVAL & MGMT  10/08/2021   IR THORACENTESIS ASP PLEURAL SPACE W/IMG GUIDE  10/12/2021   IR THORACENTESIS ASP PLEURAL SPACE W/IMG GUIDE  10/26/2021   IR  THORACENTESIS ASP PLEURAL SPACE W/IMG GUIDE  11/19/2021   IR THORACENTESIS ASP PLEURAL SPACE W/IMG GUIDE  12/22/2021   IR US GUIDE VASC ACCESS RIGHT  12/16/2021   IR US GUIDE VASC ACCESS RIGHT  12/22/2021   PELVIC LAPAROSCOPY Bilateral 1992   RADIOLOGY WITH ANESTHESIA N/A 09/16/2021   Procedure: CT MICROWAVE ABLATION;  Surgeon: Aletta Edouard, MD;  Location: [facility];  Service: Radiology;  Laterality: N/A;   UPPER GASTROINTESTINAL ENDOSCOPY  2002   had esophageal dilitation    Allergies: Hydromorphone hcl and Aspirin  Medications: Prior to Admission medications   Medication Sig Start Date End Date Taking? Authorizing Provider  dicyclomine (BENTYL) 20 MG tablet Take 0.5 tablets (10 mg total) by mouth every 6 (six) hours as needed for spasms. Take one tablet every 6-8 hours as needed for abdominal cramping 01/07/22   Noralyn Pick, NP  eplerenone (INSPRA) 25 MG tablet Take 50 mg by mouth daily. 11/18/21   [provider]  furosemide (LASIX) 40 MG tablet Take 40 mg by mouth.    [provider]  loperamide (IMODIUM) 2 MG capsule Take 2 mg by mouth as needed for diarrhea or loose stools.    [provider]  midodrine (PROAMATINE) 10 MG tablet Take 10 mg by mouth daily.    [provider]  nicotine (NICODERM CQ - DOSED IN MG/24 HOURS) 21 mg/24hr patch Place 21 mg onto the skin daily as needed (smoking cessation on work days).    [provider]  ondansetron (ZOFRAN-ODT) 4 MG disintegrating tablet Dissolve one tablet on the tongue every 8 hours as needed for nausea and vomiting. 12/01/21   Noralyn Pick, NP  pantoprazole (PROTONIX) 40 MG tablet Take 1 tablet (40 mg total) by mouth daily. 12/01/21   Noralyn Pick, NP  potassium chloride SA (KLOR-CON M) 20 MEQ tablet Take 1 tablet (20 mEq total) by mouth 2 (two) times daily. Patient taking differently: Take 40 mEq by mouth daily. 12/28/21 01/27/22  Noralyn Pick, NP   sucralfate (CARAFATE) 1 GM/10ML suspension Take 1 g by mouth 2 (two) times daily.    [provider]  tamoxifen (NOLVADEX) 20 MG tablet TAKE 1 TABLET BY MOUTH EVERY DAY Patient taking differently: Take 20 mg by mouth every morning. 10/13/20   Truitt Merle, MD  traZODone (DESYREL) 100 MG tablet Take 1 tablet (100 mg total) by mouth at bedtime as needed for sleep. Patient not taking: Reported on 12/29/2021 12/01/21   Truitt Merle, MD     Family History  Problem Relation Age of Onset   Liver cancer Mother    Lung cancer Mother    Lung cancer Father    Liver cancer Maternal Grandmother    Colon cancer Neg Hx    Esophageal cancer Neg Hx    Stomach cancer Neg Hx    Rectal cancer Neg Hx     Social History   Socioeconomic History   Marital status: Married    Spouse  name: Not on file   Number of children: Not on file   Years of education: Not on file   Highest education level: Not on file  Occupational History   Not on file  Tobacco Use   Smoking status: Every Day    Packs/day: 0.50    Years: 30.00    Pack years: 15.00    Types: Cigarettes    Start date: 11/29/1978   Smokeless tobacco: Never  Vaping Use   Vaping Use: Never used  Substance and Sexual Activity   Alcohol use: Not Currently    Alcohol/week: 6.0 standard drinks    Types: 6 Cans of beer per week    Comment: stopped 06/2019   Drug use: No   Sexual activity: Not Currently    Partners: Male  Other Topics Concern   Not on file  Social History Narrative   Not on file   Social Determinants of Health   Financial Resource Strain: Not on file  Food Insecurity: Not on file  Transportation Needs: Not on file  Physical Activity: Not on file  Stress: Not on file  Social Connections: Not on file    ECOG Status: 1 - Symptomatic but completely ambulatory  Review of Systems: A 12 point ROS discussed and pertinent positives are indicated in the HPI above.  All other systems are negative.  Review of Systems   Constitutional:  Positive for appetite change and fatigue.  Respiratory: Negative.    Cardiovascular: Negative.   Gastrointestinal: Negative.   Genitourinary: Negative.   Musculoskeletal: Negative.   Neurological: Negative.    Vital Signs: BP 103/62 (BP Location: Left Arm)    Pulse (!) 102    LMP 04/20/2013    SpO2 96%   Physical Exam Vitals reviewed.  Constitutional:      General: She is not in acute distress.    Appearance: Normal appearance. She is not ill-appearing, toxic-appearing or diaphoretic.  Abdominal:     General: There is no distension.     Palpations: Abdomen is soft.     Tenderness: There is no abdominal tenderness. There is no guarding or rebound.  Musculoskeletal:        General: No swelling.  Skin:    General: Skin is warm and dry.  Neurological:     General: No focal deficit present.     Mental Status: She is alert and oriented to person, place, and time.    Imaging: DG Chest 1 View  Result Date: 12/22/2021 CLINICAL DATA:  Shortness of breath. EXAM: CHEST  1 VIEW COMPARISON:  Chest radiograph dated 12/14/2021. FINDINGS: Small to moderate left pleural effusion, increased since the prior radiograph. There is associated left lung base atelectasis. Pneumonia is not excluded. Background of emphysema. The right lung is clear. No pneumothorax. The cardiac silhouette is within limits. Multiple surgical clips over the right chest. No acute osseous pathology. IMPRESSION: 1. Interval increase in the size of the left pleural effusion and associated atelectasis/infiltrate. 2. Emphysema. Electronically Signed   By: Anner Crete M.D.   On: 12/22/2021 21:26   DG Chest 2 View  Result Date: 01/01/2022 CLINICAL DATA:  Pleural effusion - hx breast cancer - hx esophageal varices - hepatocellular carcinoma EXAM: CHEST - 2 VIEW COMPARISON:  12/22/2021 FINDINGS: Small left pleural effusion, decreased since previous. Improved aeration at the left lung base. Right lung clear. Heart  size and mediastinal contours are within normal limits. No pneumothorax.  Surgical clips overlie the right chest. Visualized bones unremarkable. IMPRESSION:  Slight decrease in size of left pleural effusion. No acute findings. Electronically Signed   By: Lucrezia Europe M.D.   On: 01/01/2022 14:08   NM LIVER TUMOR LOC IMFLAM SPECT 1 DAY  Result Date: 12/22/2021 CLINICAL DATA:  Multifocal hepatocellular carcinoma. Initial yttrium 90 microsphere radioembolization to multifocal disease in the RIGHT hepatic lobe. EXAM: NUCLEAR MEDICINE SPECIAL MED RAD PHYSICS CONS; NUCLEAR MEDICINE RADIO PHARM THERAPY INTRA ARTERIAL; NUCLEAR MEDICINE TREATMENT PROCEDURE; NUCLEAR MEDICINE LIVER SCAN TECHNIQUE: In conjunction with the interventional radiologist a Y- Microsphere dose was calculated utilizing body surface area formulation as well as Recruitment consultant. Specialized pretreatment dosimetry calculations. Calculated dose equal 23.5 mCi. Pre therapy MAA liver SPECT scan and CTA were evaluated. Utilizing a microcatheter system, the hepatic artery was selected and Y-90 microspheres were delivered in fractionated aliquots. Radiopharmaceutical was delivered by the interventional radiologist and nuclear radiologist. The patient tolerated procedure well. No adverse effects were noted. Bremsstrahlung planar and SPECT imaging of the abdomen following intrahepatic arterial delivery of Y-90 microsphere was performed. RADIOPHARMACEUTICALS:  75.9 millicuries yttrium 90 microspheres COMPARISON:  MAA liver scan 12/16/2021, PET scan 12/15/2021, CT 11/10/2021, MRI 10/29/2021 FINDINGS: Y - 90 microspheres therapy as above. First therapy the right hepatic lobe. Bremsstrahlung planar and SPECT imaging of the abdomen following intrahepatic arterial delivery of Y-57mcrosphere demonstrates radioactivity localized to the RIGHT hepatic lobe. No evidence of extrahepatic activity. IMPRESSION: Successful Y - 90 microsphere delivery for treatment of  unresectable liver metastasis. First therapy to the RIGHT lobe. Bremssstrahlung scan demonstrates activity localized to RIGHT hepatic lobe with no extrahepatic activity identified. Electronically Signed   By: SSuzy BouchardM.D.   On: 12/22/2021 15:16   NM LIVER TUMOR LOC IMFLAM SPECT 1 DAY  Result Date: 12/16/2021 CLINICAL DATA:  Hepatocellular carcinoma with multifocal disease in the RIGHT hepatic lobe. Preradio embolization mapping. EXAM: NUCLEAR MEDICINE LIVER SCAN; ULTRASOUND MISCELLANEOUS SOFT TISSUE TECHNIQUE: Abdominal images were obtained in multiple projections after intrahepatic arterial injection of radiopharmaceutical. SPECT imaging was performed. Lung shunt calculation was performed. RADIOPHARMACEUTICALS:  4.363mlicurie MAA TECHNETIUM TO 74M ALBUMIN AGGREGATED COMPARISON:  PET-CT scan 12/15/2021, CT 11/10/2021 FINDINGS: The injected microaggregated albumin localizes within the RIGHT hepatic lobe. No evidence of activity within the stomach, duodenum, or bowel. Calculated shunt fraction to the lungs equals 6.2%. IMPRESSION: 1. No significant extrahepatic radiotracer activity following intrahepatic arterial injection of MAA. 2. Lung shunt fraction equals 6.2% Electronically Signed   By: StSuzy Bouchard.D.   On: 12/16/2021 20:07   IR Angiogram Visceral Selective  Result Date: 12/22/2021 CLINICAL DATA:  Multifocal hepatocellular carcinoma of the right lobe of the liver. Yttrium 90 radioembolization now performed. EXAM: 1. ULTRASOUND GUIDANCE FOR VASCULAR ACCESS OF THE RIGHT COMMON FEMORAL ARTERY 2. SELECTIVE ARTERIOGRAPHY AT THE LEVEL OF THE COMMON HEPATIC ARTERY 3. SELECTIVE ARTERIOGRAPHY AT THE LEVEL OF THE RIGHT HEPATIC ARTERY 4. TRANSCATHETER YTTRIUM-90 RADIOEMBOLIZATION OF THE RIGHT HEPATIC ARTERY FLUOROSCOPY TIME:  19 minutes and 30 seconds.  125 mGy. MEDICATIONS AND MEDICAL HISTORY: Moderate (conscious) sedation was employed during this procedure. A total of Versed 4.0 mg and Fentanyl  100 mcg was administered intravenously by radiology nursing. Moderate Sedation Time: 104 minutes. The patient's level of consciousness and vital signs were monitored continuously by radiology nursing throughout the procedure under my direct supervision. Additional Medications: 2 g IV cefoxitin, 8 mg IV Decadron, 40 mg IV Protonix, 4 mg IV Zofran Y-90 dose: 25 mCi ANESTHESIA/SEDATION: Moderate sedation time: 104 minutes CONTRAST:  Sixty-five mL Omnipaque 300 PROCEDURE: The  procedure, risks, benefits, and alternatives were explained to the patient. Questions regarding the procedure were encouraged and answered. The patient understands and consents to the procedure. A time-out was performed prior to initiating the procedure. The right groin was prepped with chlorhexidine in a sterile fashion, and a sterile drape was applied covering the operative field. A sterile gown and sterile gloves were used for the procedure. Local anesthesia was provided with 1% Lidocaine. Patency of the right common femoral artery was assessed and confirmed by ultrasound with permanent ultrasound image recording. Access of the right common femoral artery was performed under ultrasound guidance with a micropuncture set. A 5-French sheath was placed. A 5-French Cobra catheter was advanced and used to selectively catheterize the celiac axis followed by the common hepatic artery. Selective arteriography was performed at the level of the common hepatic artery. Repeat arteriography at the level of the common hepatic artery and right hepatic artery was necessary to confirm arterial patency and tumor supply prior to administration of yttrium-90 microspheres. A microcatheter was used to selectively catheterize the right hepatic artery. Selective arteriography of the right hepatic artery was performed. Radioembolization was performed with Yttrium-90 SIR Spheres. Particles were administered via a microcatheter utilizing a completely enclosed system.  Monitoring of antegrade flow was performed during administration under fluoroscopy with use of contrast intermittently. After administration of the first dose of particles, the microcatheter was removed and discarded along with the attached tubing and particle vial. A new microcatheter was then advanced through the Cobra catheter and used to selectively catheterize the main left hepatic artery. Selective arteriography was performed. Catheter was positioned appropriately and a second dose of Yttrium-90 SIR Spheres were administered via a new administration system. Monitoring of antegrade flow was performed during administration under fluoroscopy with periodic injection of contrast. Follow-up angiography was performed after dose administration. The microcatheter, outer catheter and administration system were then discarded. Arteriotomy closure was performed with the Cordis ExoSeal device. FINDINGS: Arteriography demonstrates stable arterial patency and supply to a dominant hepatocellular carcinoma in the superior and posterior aspect of the liver as well as inferior tip of the right lobe. Additional small foci of arterial hypervascularity also again identified in the lateral aspect of the right lobe potentially representing early small foci of carcinoma. The microcatheter was positioned in the right hepatic artery at a level to treat all tumor supply and beyond the origin of the cystic artery. Administration of yttrium 90 microspheres was uncomplicated with full administration of the prescribed dose. Antegrade flow was maintained during treatment and was confirmed by fluoroscopy. COMPLICATIONS: None IMPRESSION: Hepatic arterial radioembolization performed of the right lobe of the liver with Yttrium-90 microspheres to treat hepatocellular carcinoma. Electronically Signed   By: Aletta Edouard M.D.   On: 12/22/2021 16:03   IR Angiogram Visceral Selective  Result Date: 12/16/2021 INDICATION: Hepatocellular carcinoma  of the right lobe of the liver. Assessment by arteriography prior to planned yttrium 90 radioembolization. EXAM: 1. ULTRASOUND GUIDANCE FOR VASCULAR ACCESS OF THE RIGHT COMMON FEMORAL ARTERY 2. SELECTIVE VISCERAL ARTERIOGRAPHY WITH INITIAL SELECTIVE ARTERIOGRAPHY OF THE COMMON HEPATIC ARTERY 3. ADDITIONAL SELECTIVE ARTERIOGRAPHY OF THE RIGHT HEPATIC ARTERY 4. ADDITIONAL SELECTIVE ARTERIOGRAPHY OF A RIGHT HEPATIC ARTERY BRANCH MEDICATIONS: None ANESTHESIA/SEDATION: Moderate (conscious) sedation was employed during this procedure. A total of Versed 4.0 mg and Fentanyl 100 mcg was administered intravenously by radiology nursing. Moderate Sedation Time: 62 minutes. The patient's level of consciousness and vital signs were monitored continuously by radiology nursing throughout the procedure under my  direct supervision. CONTRAST:  60 mL Omnipaque 300 FLUOROSCOPY TIME:  Fluoroscopy Time: 9 minutes and 20 seconds. 173 mGy. COMPLICATIONS: None immediate. PROCEDURE: Informed consent was obtained from the patient following explanation of the procedure, risks, benefits and alternatives. The patient understands, agrees and consents for the procedure. All questions were addressed. A time out was performed prior to the initiation of the procedure. Maximal barrier sterile technique utilized including caps, mask, sterile gowns, sterile gloves, large sterile drape, hand hygiene, and chlorhexidine prep. Local anesthesia was provided with 1% lidocaine. Ultrasound was used to confirm patency of the right common femoral artery. A permanent ultrasound image was recorded. Under direct ultrasound guidance, a 21 gauge needle was advanced into the right common femoral artery. After access with a micropuncture set, a 5 French sheath was placed over a guidewire. A 5 French Cobra catheter was advanced into the abdominal aorta and used to selectively catheterize the celiac axis. The catheter was then further advanced into the common hepatic  artery. Selective arteriography was performed at the level of the proximal common hepatic artery. The 5 French catheter was then used to selectively catheterize the proximal right hepatic artery over a guidewire. Selective arteriography was performed at the level of the right hepatic artery including rotational arteriography. A microcatheter was advanced through the 5 French catheter and used to selectively catheterize a more distal trunk of the right hepatic artery. Selective arteriography was performed through the microcatheter. Attempt was made to selectively catheterize the cystic artery utilizing a small caliber guidewire and microcatheter. A dose of 4.4 millicuries of technetium 99-m MAA was then administered through the microcatheter at the level of the right hepatic artery. Catheters were removed. Hemostasis was obtained at the level of the femoral access utilizing the Cordis ExoSeal device. FINDINGS: Common hepatic arteriography demonstrates typical hepatic arterial anatomy without normal variant. Just beyond the gastroduodenal artery, the proper hepatic artery almost immediately bifurcates into right and left hepatic arteries. There are some tiny branches emanating from the distal proper hepatic artery/proper hepatic bifurcation that likely supply some small duodenal branches and a small right gastric artery. Right hepatic arteriography demonstrates an early branch that supplies a territory at the junction between right and left lobes. A small cystic artery emanates from the distal right hepatic artery and extends inferiorly. Beyond the cystic artery, there is a dominant posterior and superiorly oriented hepatic artery branch supplying the region of known tumor in the posterior aspect of the right lobe of the liver seen by recent imaging with multiple tortuous and hypervascular branches present supplying a roughly oval area of increased arterial vascularity measuring roughly 5 cm in maximum diameter  transversely. Dumbbell shaped area of arterial hypervascularity is also seen in the inferior tip of the right lobe of the liver supplied by inferior trunks of the right hepatic artery and suspicious for a smaller focus of hepatocellular carcinoma by prior imaging. There are also 2 small foci of increased arterial vascularity in the lateral aspect of the mid right lobe that could potentially represent small early foci of carcinoma. Based on arteriographic appearance, the entire right lobe will have to be treated with yttrium 90 microspheres. The cystic artery was attempted to be catheterized for prophylactic embolization but is very small and could not be selectively catheterized. There is room beyond the cystic artery to administer microspheres to the territories of abnormal right lobe vascularity. IMPRESSION: Hepatic arteriography performed in planning for right lobe yttrium 90 radioembolization. Abnormal arterial tumor hypervascularity in the posterior  and superior right lobe corresponds to the dominant area of hepatocellular carcinoma in the right lobe and measures roughly 5 cm in maximal diameter. There are 3 other smaller areas of abnormal arterial vascularity in the right lobe including a dumbbell-shaped region in the inferior tip of the liver which was appreciated on the recent CT study and 2 other smaller foci in the lateral aspect of the right lobe suspicious for early foci of carcinoma. The cystic artery could not be catheterized for prophylactic embolization. A dose of MAA was administered in the right hepatic artery at the level of planned treatment for further nuclear medicine imaging and lung shunt fraction calculation. Electronically Signed   By: Aletta Edouard M.D.   On: 12/16/2021 16:12   IR Angiogram Selective Each Additional Vessel  Result Date: 12/22/2021 CLINICAL DATA:  Multifocal hepatocellular carcinoma of the right lobe of the liver. Yttrium 90 radioembolization now performed. EXAM: 1.  ULTRASOUND GUIDANCE FOR VASCULAR ACCESS OF THE RIGHT COMMON FEMORAL ARTERY 2. SELECTIVE ARTERIOGRAPHY AT THE LEVEL OF THE COMMON HEPATIC ARTERY 3. SELECTIVE ARTERIOGRAPHY AT THE LEVEL OF THE RIGHT HEPATIC ARTERY 4. TRANSCATHETER YTTRIUM-90 RADIOEMBOLIZATION OF THE RIGHT HEPATIC ARTERY FLUOROSCOPY TIME:  19 minutes and 30 seconds.  125 mGy. MEDICATIONS AND MEDICAL HISTORY: Moderate (conscious) sedation was employed during this procedure. A total of Versed 4.0 mg and Fentanyl 100 mcg was administered intravenously by radiology nursing. Moderate Sedation Time: 104 minutes. The patient's level of consciousness and vital signs were monitored continuously by radiology nursing throughout the procedure under my direct supervision. Additional Medications: 2 g IV cefoxitin, 8 mg IV Decadron, 40 mg IV Protonix, 4 mg IV Zofran Y-90 dose: 25 mCi ANESTHESIA/SEDATION: Moderate sedation time: 104 minutes CONTRAST:  Sixty-five mL Omnipaque 300 PROCEDURE: The procedure, risks, benefits, and alternatives were explained to the patient. Questions regarding the procedure were encouraged and answered. The patient understands and consents to the procedure. A time-out was performed prior to initiating the procedure. The right groin was prepped with chlorhexidine in a sterile fashion, and a sterile drape was applied covering the operative field. A sterile gown and sterile gloves were used for the procedure. Local anesthesia was provided with 1% Lidocaine. Patency of the right common femoral artery was assessed and confirmed by ultrasound with permanent ultrasound image recording. Access of the right common femoral artery was performed under ultrasound guidance with a micropuncture set. A 5-French sheath was placed. A 5-French Cobra catheter was advanced and used to selectively catheterize the celiac axis followed by the common hepatic artery. Selective arteriography was performed at the level of the common hepatic artery. Repeat  arteriography at the level of the common hepatic artery and right hepatic artery was necessary to confirm arterial patency and tumor supply prior to administration of yttrium-90 microspheres. A microcatheter was used to selectively catheterize the right hepatic artery. Selective arteriography of the right hepatic artery was performed. Radioembolization was performed with Yttrium-90 SIR Spheres. Particles were administered via a microcatheter utilizing a completely enclosed system. Monitoring of antegrade flow was performed during administration under fluoroscopy with use of contrast intermittently. After administration of the first dose of particles, the microcatheter was removed and discarded along with the attached tubing and particle vial. A new microcatheter was then advanced through the Cobra catheter and used to selectively catheterize the main left hepatic artery. Selective arteriography was performed. Catheter was positioned appropriately and a second dose of Yttrium-90 SIR Spheres were administered via a new administration system. Monitoring of antegrade flow  was performed during administration under fluoroscopy with periodic injection of contrast. Follow-up angiography was performed after dose administration. The microcatheter, outer catheter and administration system were then discarded. Arteriotomy closure was performed with the Cordis ExoSeal device. FINDINGS: Arteriography demonstrates stable arterial patency and supply to a dominant hepatocellular carcinoma in the superior and posterior aspect of the liver as well as inferior tip of the right lobe. Additional small foci of arterial hypervascularity also again identified in the lateral aspect of the right lobe potentially representing early small foci of carcinoma. The microcatheter was positioned in the right hepatic artery at a level to treat all tumor supply and beyond the origin of the cystic artery. Administration of yttrium 90 microspheres was  uncomplicated with full administration of the prescribed dose. Antegrade flow was maintained during treatment and was confirmed by fluoroscopy. COMPLICATIONS: None IMPRESSION: Hepatic arterial radioembolization performed of the right lobe of the liver with Yttrium-90 microspheres to treat hepatocellular carcinoma. Electronically Signed   By: Aletta Edouard M.D.   On: 12/22/2021 16:03   IR Angiogram Selective Each Additional Vessel  Result Date: 12/16/2021 INDICATION: Hepatocellular carcinoma of the right lobe of the liver. Assessment by arteriography prior to planned yttrium 90 radioembolization. EXAM: 1. ULTRASOUND GUIDANCE FOR VASCULAR ACCESS OF THE RIGHT COMMON FEMORAL ARTERY 2. SELECTIVE VISCERAL ARTERIOGRAPHY WITH INITIAL SELECTIVE ARTERIOGRAPHY OF THE COMMON HEPATIC ARTERY 3. ADDITIONAL SELECTIVE ARTERIOGRAPHY OF THE RIGHT HEPATIC ARTERY 4. ADDITIONAL SELECTIVE ARTERIOGRAPHY OF A RIGHT HEPATIC ARTERY BRANCH MEDICATIONS: None ANESTHESIA/SEDATION: Moderate (conscious) sedation was employed during this procedure. A total of Versed 4.0 mg and Fentanyl 100 mcg was administered intravenously by radiology nursing. Moderate Sedation Time: 62 minutes. The patient's level of consciousness and vital signs were monitored continuously by radiology nursing throughout the procedure under my direct supervision. CONTRAST:  60 mL Omnipaque 300 FLUOROSCOPY TIME:  Fluoroscopy Time: 9 minutes and 20 seconds. 173 mGy. COMPLICATIONS: None immediate. PROCEDURE: Informed consent was obtained from the patient following explanation of the procedure, risks, benefits and alternatives. The patient understands, agrees and consents for the procedure. All questions were addressed. A time out was performed prior to the initiation of the procedure. Maximal barrier sterile technique utilized including caps, mask, sterile gowns, sterile gloves, large sterile drape, hand hygiene, and chlorhexidine prep. Local anesthesia was provided with 1%  lidocaine. Ultrasound was used to confirm patency of the right common femoral artery. A permanent ultrasound image was recorded. Under direct ultrasound guidance, a 21 gauge needle was advanced into the right common femoral artery. After access with a micropuncture set, a 5 French sheath was placed over a guidewire. A 5 French Cobra catheter was advanced into the abdominal aorta and used to selectively catheterize the celiac axis. The catheter was then further advanced into the common hepatic artery. Selective arteriography was performed at the level of the proximal common hepatic artery. The 5 French catheter was then used to selectively catheterize the proximal right hepatic artery over a guidewire. Selective arteriography was performed at the level of the right hepatic artery including rotational arteriography. A microcatheter was advanced through the 5 French catheter and used to selectively catheterize a more distal trunk of the right hepatic artery. Selective arteriography was performed through the microcatheter. Attempt was made to selectively catheterize the cystic artery utilizing a small caliber guidewire and microcatheter. A dose of 4.4 millicuries of technetium 99-m MAA was then administered through the microcatheter at the level of the right hepatic artery. Catheters were removed. Hemostasis was obtained at the  level of the femoral access utilizing the Cordis ExoSeal device. FINDINGS: Common hepatic arteriography demonstrates typical hepatic arterial anatomy without normal variant. Just beyond the gastroduodenal artery, the proper hepatic artery almost immediately bifurcates into right and left hepatic arteries. There are some tiny branches emanating from the distal proper hepatic artery/proper hepatic bifurcation that likely supply some small duodenal branches and a small right gastric artery. Right hepatic arteriography demonstrates an early branch that supplies a territory at the junction between  right and left lobes. A small cystic artery emanates from the distal right hepatic artery and extends inferiorly. Beyond the cystic artery, there is a dominant posterior and superiorly oriented hepatic artery branch supplying the region of known tumor in the posterior aspect of the right lobe of the liver seen by recent imaging with multiple tortuous and hypervascular branches present supplying a roughly oval area of increased arterial vascularity measuring roughly 5 cm in maximum diameter transversely. Dumbbell shaped area of arterial hypervascularity is also seen in the inferior tip of the right lobe of the liver supplied by inferior trunks of the right hepatic artery and suspicious for a smaller focus of hepatocellular carcinoma by prior imaging. There are also 2 small foci of increased arterial vascularity in the lateral aspect of the mid right lobe that could potentially represent small early foci of carcinoma. Based on arteriographic appearance, the entire right lobe will have to be treated with yttrium 90 microspheres. The cystic artery was attempted to be catheterized for prophylactic embolization but is very small and could not be selectively catheterized. There is room beyond the cystic artery to administer microspheres to the territories of abnormal right lobe vascularity. IMPRESSION: Hepatic arteriography performed in planning for right lobe yttrium 90 radioembolization. Abnormal arterial tumor hypervascularity in the posterior and superior right lobe corresponds to the dominant area of hepatocellular carcinoma in the right lobe and measures roughly 5 cm in maximal diameter. There are 3 other smaller areas of abnormal arterial vascularity in the right lobe including a dumbbell-shaped region in the inferior tip of the liver which was appreciated on the recent CT study and 2 other smaller foci in the lateral aspect of the right lobe suspicious for early foci of carcinoma. The cystic artery could not be  catheterized for prophylactic embolization. A dose of MAA was administered in the right hepatic artery at the level of planned treatment for further nuclear medicine imaging and lung shunt fraction calculation. Electronically Signed   By: Aletta Edouard M.D.   On: 12/16/2021 16:12   IR Angiogram Selective Each Additional Vessel  Result Date: 12/16/2021 INDICATION: Hepatocellular carcinoma of the right lobe of the liver. Assessment by arteriography prior to planned yttrium 90 radioembolization. EXAM: 1. ULTRASOUND GUIDANCE FOR VASCULAR ACCESS OF THE RIGHT COMMON FEMORAL ARTERY 2. SELECTIVE VISCERAL ARTERIOGRAPHY WITH INITIAL SELECTIVE ARTERIOGRAPHY OF THE COMMON HEPATIC ARTERY 3. ADDITIONAL SELECTIVE ARTERIOGRAPHY OF THE RIGHT HEPATIC ARTERY 4. ADDITIONAL SELECTIVE ARTERIOGRAPHY OF A RIGHT HEPATIC ARTERY BRANCH MEDICATIONS: None ANESTHESIA/SEDATION: Moderate (conscious) sedation was employed during this procedure. A total of Versed 4.0 mg and Fentanyl 100 mcg was administered intravenously by radiology nursing. Moderate Sedation Time: 62 minutes. The patient's level of consciousness and vital signs were monitored continuously by radiology nursing throughout the procedure under my direct supervision. CONTRAST:  60 mL Omnipaque 300 FLUOROSCOPY TIME:  Fluoroscopy Time: 9 minutes and 20 seconds. 173 mGy. COMPLICATIONS: None immediate. PROCEDURE: Informed consent was obtained from the patient following explanation of the procedure, risks, benefits and alternatives.  The patient understands, agrees and consents for the procedure. All questions were addressed. A time out was performed prior to the initiation of the procedure. Maximal barrier sterile technique utilized including caps, mask, sterile gowns, sterile gloves, large sterile drape, hand hygiene, and chlorhexidine prep. Local anesthesia was provided with 1% lidocaine. Ultrasound was used to confirm patency of the right common femoral artery. A permanent  ultrasound image was recorded. Under direct ultrasound guidance, a 21 gauge needle was advanced into the right common femoral artery. After access with a micropuncture set, a 5 French sheath was placed over a guidewire. A 5 French Cobra catheter was advanced into the abdominal aorta and used to selectively catheterize the celiac axis. The catheter was then further advanced into the common hepatic artery. Selective arteriography was performed at the level of the proximal common hepatic artery. The 5 French catheter was then used to selectively catheterize the proximal right hepatic artery over a guidewire. Selective arteriography was performed at the level of the right hepatic artery including rotational arteriography. A microcatheter was advanced through the 5 French catheter and used to selectively catheterize a more distal trunk of the right hepatic artery. Selective arteriography was performed through the microcatheter. Attempt was made to selectively catheterize the cystic artery utilizing a small caliber guidewire and microcatheter. A dose of 4.4 millicuries of technetium 99-m MAA was then administered through the microcatheter at the level of the right hepatic artery. Catheters were removed. Hemostasis was obtained at the level of the femoral access utilizing the Cordis ExoSeal device. FINDINGS: Common hepatic arteriography demonstrates typical hepatic arterial anatomy without normal variant. Just beyond the gastroduodenal artery, the proper hepatic artery almost immediately bifurcates into right and left hepatic arteries. There are some tiny branches emanating from the distal proper hepatic artery/proper hepatic bifurcation that likely supply some small duodenal branches and a small right gastric artery. Right hepatic arteriography demonstrates an early branch that supplies a territory at the junction between right and left lobes. A small cystic artery emanates from the distal right hepatic artery and extends  inferiorly. Beyond the cystic artery, there is a dominant posterior and superiorly oriented hepatic artery branch supplying the region of known tumor in the posterior aspect of the right lobe of the liver seen by recent imaging with multiple tortuous and hypervascular branches present supplying a roughly oval area of increased arterial vascularity measuring roughly 5 cm in maximum diameter transversely. Dumbbell shaped area of arterial hypervascularity is also seen in the inferior tip of the right lobe of the liver supplied by inferior trunks of the right hepatic artery and suspicious for a smaller focus of hepatocellular carcinoma by prior imaging. There are also 2 small foci of increased arterial vascularity in the lateral aspect of the mid right lobe that could potentially represent small early foci of carcinoma. Based on arteriographic appearance, the entire right lobe will have to be treated with yttrium 90 microspheres. The cystic artery was attempted to be catheterized for prophylactic embolization but is very small and could not be selectively catheterized. There is room beyond the cystic artery to administer microspheres to the territories of abnormal right lobe vascularity. IMPRESSION: Hepatic arteriography performed in planning for right lobe yttrium 90 radioembolization. Abnormal arterial tumor hypervascularity in the posterior and superior right lobe corresponds to the dominant area of hepatocellular carcinoma in the right lobe and measures roughly 5 cm in maximal diameter. There are 3 other smaller areas of abnormal arterial vascularity in the right lobe including a  dumbbell-shaped region in the inferior tip of the liver which was appreciated on the recent CT study and 2 other smaller foci in the lateral aspect of the right lobe suspicious for early foci of carcinoma. The cystic artery could not be catheterized for prophylactic embolization. A dose of MAA was administered in the right hepatic artery at  the level of planned treatment for further nuclear medicine imaging and lung shunt fraction calculation. Electronically Signed   By: Aletta Edouard M.D.   On: 12/16/2021 16:12   NM PET Image Initial (PI) Skull Base To Thigh  Result Date: 12/15/2021 CLINICAL DATA:  Subsequent treatment strategy for hepatocellular carcinoma. Prior liver directed therapy (microwave ablation) EXAM: NUCLEAR MEDICINE PET SKULL BASE TO THIGH TECHNIQUE: 5.0 mCi F-18 FDG was injected intravenously. Full-ring PET imaging was performed from the skull base to thigh after the radiotracer. CT data was obtained and used for attenuation correction and anatomic localization. Fasting blood glucose: 111 mg/dl COMPARISON:  CT 12 1322, 10/29/2021 FINDINGS: Mediastinal blood pool activity: SUV max 2.1 Liver activity: SUV max NA NECK: No hypermetabolic lymph nodes in the neck. Incidental CT findings: none CHEST: No hypermetabolic mediastinal lymph nodes. No hypermetabolic pulmonary nodules. Moderate size LEFT pleural effusion. Linear nodularity in the lingular lobe (image 168/4) does not metabolic activity. Incidental CT findings: none ABDOMEN/PELVIS: Intense focus of metabolic activity posterior RIGHT hepatic lobe with SUV max equal 6.4. Lesion is not identified on noncontrast CT portion exam but measures approximately 4 cm x 2.6 cm on the PET portion exam. No additional discrete hypermetabolic lesions present. The smaller lesions identified on comparison MRI in the RIGHT hepatic lobe are non appreciated by PET imaging. No hypermetabolic periportal lymph nodes. Incidental CT findings: Small amount free fluid the pelvis. SKELETON: No aggressive osseous lesion Incidental CT findings: none IMPRESSION: 1. Intensely hypermetabolic mass in the posterior RIGHT hepatic lobe consistent with known hepatocellular carcinoma. Smaller lesions identified on MRI are not hypermetabolic. 2. No evidence of metastatic lymphadenopathy or distant metastatic disease. 3.  Elongated nodular thickening in the lingula is favored benign. Moderate LEFT pleural effusion. Electronically Signed   By: Suzy Bouchard M.D.   On: 12/15/2021 12:00   NM Special Med Rad Physics Cons  Result Date: 12/22/2021 CLINICAL DATA:  Multifocal hepatocellular carcinoma. Initial yttrium 90 microsphere radioembolization to multifocal disease in the RIGHT hepatic lobe. EXAM: NUCLEAR MEDICINE SPECIAL MED RAD PHYSICS CONS; NUCLEAR MEDICINE RADIO PHARM THERAPY INTRA ARTERIAL; NUCLEAR MEDICINE TREATMENT PROCEDURE; NUCLEAR MEDICINE LIVER SCAN TECHNIQUE: In conjunction with the interventional radiologist a Y- Microsphere dose was calculated utilizing body surface area formulation as well as Recruitment consultant. Specialized pretreatment dosimetry calculations. Calculated dose equal 23.5 mCi. Pre therapy MAA liver SPECT scan and CTA were evaluated. Utilizing a microcatheter system, the hepatic artery was selected and Y-90 microspheres were delivered in fractionated aliquots. Radiopharmaceutical was delivered by the interventional radiologist and nuclear radiologist. The patient tolerated procedure well. No adverse effects were noted. Bremsstrahlung planar and SPECT imaging of the abdomen following intrahepatic arterial delivery of Y-90 microsphere was performed. RADIOPHARMACEUTICALS:  24.2 millicuries yttrium 90 microspheres COMPARISON:  MAA liver scan 12/16/2021, PET scan 12/15/2021, CT 11/10/2021, MRI 10/29/2021 FINDINGS: Y - 90 microspheres therapy as above. First therapy the right hepatic lobe. Bremsstrahlung planar and SPECT imaging of the abdomen following intrahepatic arterial delivery of Y-77mcrosphere demonstrates radioactivity localized to the RIGHT hepatic lobe. No evidence of extrahepatic activity. IMPRESSION: Successful Y - 90 microsphere delivery for treatment of unresectable liver metastasis. First therapy  to the RIGHT lobe. Bremssstrahlung scan demonstrates activity localized to RIGHT hepatic  lobe with no extrahepatic activity identified. Electronically Signed   By: Suzy Bouchard M.D.   On: 12/22/2021 15:16   NM Special Treatment Procedure  Result Date: 12/22/2021 CLINICAL DATA:  Multifocal hepatocellular carcinoma. Initial yttrium 90 microsphere radioembolization to multifocal disease in the RIGHT hepatic lobe. EXAM: NUCLEAR MEDICINE SPECIAL MED RAD PHYSICS CONS; NUCLEAR MEDICINE RADIO PHARM THERAPY INTRA ARTERIAL; NUCLEAR MEDICINE TREATMENT PROCEDURE; NUCLEAR MEDICINE LIVER SCAN TECHNIQUE: In conjunction with the interventional radiologist a Y- Microsphere dose was calculated utilizing body surface area formulation as well as Recruitment consultant. Specialized pretreatment dosimetry calculations. Calculated dose equal 23.5 mCi. Pre therapy MAA liver SPECT scan and CTA were evaluated. Utilizing a microcatheter system, the hepatic artery was selected and Y-90 microspheres were delivered in fractionated aliquots. Radiopharmaceutical was delivered by the interventional radiologist and nuclear radiologist. The patient tolerated procedure well. No adverse effects were noted. Bremsstrahlung planar and SPECT imaging of the abdomen following intrahepatic arterial delivery of Y-90 microsphere was performed. RADIOPHARMACEUTICALS:  76.1 millicuries yttrium 90 microspheres COMPARISON:  MAA liver scan 12/16/2021, PET scan 12/15/2021, CT 11/10/2021, MRI 10/29/2021 FINDINGS: Y - 90 microspheres therapy as above. First therapy the right hepatic lobe. Bremsstrahlung planar and SPECT imaging of the abdomen following intrahepatic arterial delivery of Y-12mcrosphere demonstrates radioactivity localized to the RIGHT hepatic lobe. No evidence of extrahepatic activity. IMPRESSION: Successful Y - 90 microsphere delivery for treatment of unresectable liver metastasis. First therapy to the RIGHT lobe. Bremssstrahlung scan demonstrates activity localized to RIGHT hepatic lobe with no extrahepatic activity identified.  Electronically Signed   By: SSuzy BouchardM.D.   On: 12/22/2021 15:16   IR [place] Vasc Access Right  Result Date: 12/22/2021 CLINICAL DATA:  Multifocal hepatocellular carcinoma of the right lobe of the liver. Yttrium 90 radioembolization now performed. EXAM: 1. ULTRASOUND GUIDANCE FOR VASCULAR ACCESS OF THE RIGHT COMMON FEMORAL ARTERY 2. SELECTIVE ARTERIOGRAPHY AT THE LEVEL OF THE COMMON HEPATIC ARTERY 3. SELECTIVE ARTERIOGRAPHY AT THE LEVEL OF THE RIGHT HEPATIC ARTERY 4. TRANSCATHETER YTTRIUM-90 RADIOEMBOLIZATION OF THE RIGHT HEPATIC ARTERY FLUOROSCOPY TIME:  19 minutes and 30 seconds.  125 mGy. MEDICATIONS AND MEDICAL HISTORY: Moderate (conscious) sedation was employed during this procedure. A total of Versed 4.0 mg and Fentanyl 100 mcg was administered intravenously by radiology nursing. Moderate Sedation Time: 104 minutes. The patient's level of consciousness and vital signs were monitored continuously by radiology nursing throughout the procedure under my direct supervision. Additional Medications: 2 g IV cefoxitin, 8 mg IV Decadron, 40 mg IV Protonix, 4 mg IV Zofran Y-90 dose: 25 mCi ANESTHESIA/SEDATION: Moderate sedation time: 104 minutes CONTRAST:  Sixty-five mL Omnipaque 300 PROCEDURE: The procedure, risks, benefits, and alternatives were explained to the patient. Questions regarding the procedure were encouraged and answered. The patient understands and consents to the procedure. A time-out was performed prior to initiating the procedure. The right groin was prepped with chlorhexidine in a sterile fashion, and a sterile drape was applied covering the operative field. A sterile gown and sterile gloves were used for the procedure. Local anesthesia was provided with 1% Lidocaine. Patency of the right common femoral artery was assessed and confirmed by ultrasound with permanent ultrasound image recording. Access of the right common femoral artery was performed under ultrasound guidance with a  micropuncture set. A 5-French sheath was placed. A 5-French Cobra catheter was advanced and used to selectively catheterize the celiac axis followed by the common hepatic artery. Selective  arteriography was performed at the level of the common hepatic artery. Repeat arteriography at the level of the common hepatic artery and right hepatic artery was necessary to confirm arterial patency and tumor supply prior to administration of yttrium-90 microspheres. A microcatheter was used to selectively catheterize the right hepatic artery. Selective arteriography of the right hepatic artery was performed. Radioembolization was performed with Yttrium-90 SIR Spheres. Particles were administered via a microcatheter utilizing a completely enclosed system. Monitoring of antegrade flow was performed during administration under fluoroscopy with use of contrast intermittently. After administration of the first dose of particles, the microcatheter was removed and discarded along with the attached tubing and particle vial. A new microcatheter was then advanced through the Cobra catheter and used to selectively catheterize the main left hepatic artery. Selective arteriography was performed. Catheter was positioned appropriately and a second dose of Yttrium-90 SIR Spheres were administered via a new administration system. Monitoring of antegrade flow was performed during administration under fluoroscopy with periodic injection of contrast. Follow-up angiography was performed after dose administration. The microcatheter, outer catheter and administration system were then discarded. Arteriotomy closure was performed with the Cordis ExoSeal device. FINDINGS: Arteriography demonstrates stable arterial patency and supply to a dominant hepatocellular carcinoma in the superior and posterior aspect of the liver as well as inferior tip of the right lobe. Additional small foci of arterial hypervascularity also again identified in the lateral  aspect of the right lobe potentially representing early small foci of carcinoma. The microcatheter was positioned in the right hepatic artery at a level to treat all tumor supply and beyond the origin of the cystic artery. Administration of yttrium 90 microspheres was uncomplicated with full administration of the prescribed dose. Antegrade flow was maintained during treatment and was confirmed by fluoroscopy. COMPLICATIONS: None IMPRESSION: Hepatic arterial radioembolization performed of the right lobe of the liver with Yttrium-90 microspheres to treat hepatocellular carcinoma. Electronically Signed   By: Aletta Edouard M.D.   On: 12/22/2021 16:03   IR US Guide Vasc Access Right  Result Date: 12/16/2021 INDICATION: Hepatocellular carcinoma of the right lobe of the liver. Assessment by arteriography prior to planned yttrium 90 radioembolization. EXAM: 1. ULTRASOUND GUIDANCE FOR VASCULAR ACCESS OF THE RIGHT COMMON FEMORAL ARTERY 2. SELECTIVE VISCERAL ARTERIOGRAPHY WITH INITIAL SELECTIVE ARTERIOGRAPHY OF THE COMMON HEPATIC ARTERY 3. ADDITIONAL SELECTIVE ARTERIOGRAPHY OF THE RIGHT HEPATIC ARTERY 4. ADDITIONAL SELECTIVE ARTERIOGRAPHY OF A RIGHT HEPATIC ARTERY BRANCH MEDICATIONS: None ANESTHESIA/SEDATION: Moderate (conscious) sedation was employed during this procedure. A total of Versed 4.0 mg and Fentanyl 100 mcg was administered intravenously by radiology nursing. Moderate Sedation Time: 62 minutes. The patient's level of consciousness and vital signs were monitored continuously by radiology nursing throughout the procedure under my direct supervision. CONTRAST:  60 mL Omnipaque 300 FLUOROSCOPY TIME:  Fluoroscopy Time: 9 minutes and 20 seconds. 173 mGy. COMPLICATIONS: None immediate. PROCEDURE: Informed consent was obtained from the patient following explanation of the procedure, risks, benefits and alternatives. The patient understands, agrees and consents for the procedure. All questions were addressed. A time  out was performed prior to the initiation of the procedure. Maximal barrier sterile technique utilized including caps, mask, sterile gowns, sterile gloves, large sterile drape, hand hygiene, and chlorhexidine prep. Local anesthesia was provided with 1% lidocaine. Ultrasound was used to confirm patency of the right common femoral artery. A permanent ultrasound image was recorded. Under direct ultrasound guidance, a 21 gauge needle was advanced into the right common femoral artery. After access with a micropuncture set,  a 5 French sheath was placed over a guidewire. A 5 French Cobra catheter was advanced into the abdominal aorta and used to selectively catheterize the celiac axis. The catheter was then further advanced into the common hepatic artery. Selective arteriography was performed at the level of the proximal common hepatic artery. The 5 French catheter was then used to selectively catheterize the proximal right hepatic artery over a guidewire. Selective arteriography was performed at the level of the right hepatic artery including rotational arteriography. A microcatheter was advanced through the 5 French catheter and used to selectively catheterize a more distal trunk of the right hepatic artery. Selective arteriography was performed through the microcatheter. Attempt was made to selectively catheterize the cystic artery utilizing a small caliber guidewire and microcatheter. A dose of 4.4 millicuries of technetium 99-m MAA was then administered through the microcatheter at the level of the right hepatic artery. Catheters were removed. Hemostasis was obtained at the level of the femoral access utilizing the Cordis ExoSeal device. FINDINGS: Common hepatic arteriography demonstrates typical hepatic arterial anatomy without normal variant. Just beyond the gastroduodenal artery, the proper hepatic artery almost immediately bifurcates into right and left hepatic arteries. There are some tiny branches emanating from  the distal proper hepatic artery/proper hepatic bifurcation that likely supply some small duodenal branches and a small right gastric artery. Right hepatic arteriography demonstrates an early branch that supplies a territory at the junction between right and left lobes. A small cystic artery emanates from the distal right hepatic artery and extends inferiorly. Beyond the cystic artery, there is a dominant posterior and superiorly oriented hepatic artery branch supplying the region of known tumor in the posterior aspect of the right lobe of the liver seen by recent imaging with multiple tortuous and hypervascular branches present supplying a roughly oval area of increased arterial vascularity measuring roughly 5 cm in maximum diameter transversely. Dumbbell shaped area of arterial hypervascularity is also seen in the inferior tip of the right lobe of the liver supplied by inferior trunks of the right hepatic artery and suspicious for a smaller focus of hepatocellular carcinoma by prior imaging. There are also 2 small foci of increased arterial vascularity in the lateral aspect of the mid right lobe that could potentially represent small early foci of carcinoma. Based on arteriographic appearance, the entire right lobe will have to be treated with yttrium 90 microspheres. The cystic artery was attempted to be catheterized for prophylactic embolization but is very small and could not be selectively catheterized. There is room beyond the cystic artery to administer microspheres to the territories of abnormal right lobe vascularity. IMPRESSION: Hepatic arteriography performed in planning for right lobe yttrium 90 radioembolization. Abnormal arterial tumor hypervascularity in the posterior and superior right lobe corresponds to the dominant area of hepatocellular carcinoma in the right lobe and measures roughly 5 cm in maximal diameter. There are 3 other smaller areas of abnormal arterial vascularity in the right lobe  including a dumbbell-shaped region in the inferior tip of the liver which was appreciated on the recent CT study and 2 other smaller foci in the lateral aspect of the right lobe suspicious for early foci of carcinoma. The cystic artery could not be catheterized for prophylactic embolization. A dose of MAA was administered in the right hepatic artery at the level of planned treatment for further nuclear medicine imaging and lung shunt fraction calculation. Electronically Signed   By: Aletta Edouard M.D.   On: 12/16/2021 16:12   DG CHEST PORT  1 VIEW  Result Date: 12/14/2021 CLINICAL DATA:  Status post thoracentesis EXAM: PORTABLE CHEST 1 VIEW COMPARISON:  11/19/2021 FINDINGS: Surgical clips project over the lateral right chest. Midline trachea. Normal heart size and mediastinal contours. Small left pleural effusion is similar. No pneumothorax. No lobar consolidation. Mild interstitial thickening likely relates to chronic bronchitis/emphysema. IMPRESSION: Similar small left pleural effusion, without superimposed acute process. Emphysema (ICD10-J43.9). Electronically Signed   By: Abigail Miyamoto M.D.   On: 12/14/2021 14:56   IR EMBO TUMOR ORGAN ISCHEMIA INFARCT INC GUIDE ROADMAPPING  Result Date: 12/22/2021 CLINICAL DATA:  Multifocal hepatocellular carcinoma of the right lobe of the liver. Yttrium 90 radioembolization now performed. EXAM: 1. ULTRASOUND GUIDANCE FOR VASCULAR ACCESS OF THE RIGHT COMMON FEMORAL ARTERY 2. SELECTIVE ARTERIOGRAPHY AT THE LEVEL OF THE COMMON HEPATIC ARTERY 3. SELECTIVE ARTERIOGRAPHY AT THE LEVEL OF THE RIGHT HEPATIC ARTERY 4. TRANSCATHETER YTTRIUM-90 RADIOEMBOLIZATION OF THE RIGHT HEPATIC ARTERY FLUOROSCOPY TIME:  19 minutes and 30 seconds.  125 mGy. MEDICATIONS AND MEDICAL HISTORY: Moderate (conscious) sedation was employed during this procedure. A total of Versed 4.0 mg and Fentanyl 100 mcg was administered intravenously by radiology nursing. Moderate Sedation Time: 104 minutes. The  patient's level of consciousness and vital signs were monitored continuously by radiology nursing throughout the procedure under my direct supervision. Additional Medications: 2 g IV cefoxitin, 8 mg IV Decadron, 40 mg IV Protonix, 4 mg IV Zofran Y-90 dose: 25 mCi ANESTHESIA/SEDATION: Moderate sedation time: 104 minutes CONTRAST:  Sixty-five mL Omnipaque 300 PROCEDURE: The procedure, risks, benefits, and alternatives were explained to the patient. Questions regarding the procedure were encouraged and answered. The patient understands and consents to the procedure. A time-out was performed prior to initiating the procedure. The right groin was prepped with chlorhexidine in a sterile fashion, and a sterile drape was applied covering the operative field. A sterile gown and sterile gloves were used for the procedure. Local anesthesia was provided with 1% Lidocaine. Patency of the right common femoral artery was assessed and confirmed by ultrasound with permanent ultrasound image recording. Access of the right common femoral artery was performed under ultrasound guidance with a micropuncture set. A 5-French sheath was placed. A 5-French Cobra catheter was advanced and used to selectively catheterize the celiac axis followed by the common hepatic artery. Selective arteriography was performed at the level of the common hepatic artery. Repeat arteriography at the level of the common hepatic artery and right hepatic artery was necessary to confirm arterial patency and tumor supply prior to administration of yttrium-90 microspheres. A microcatheter was used to selectively catheterize the right hepatic artery. Selective arteriography of the right hepatic artery was performed. Radioembolization was performed with Yttrium-90 SIR Spheres. Particles were administered via a microcatheter utilizing a completely enclosed system. Monitoring of antegrade flow was performed during administration under fluoroscopy with use of contrast  intermittently. After administration of the first dose of particles, the microcatheter was removed and discarded along with the attached tubing and particle vial. A new microcatheter was then advanced through the Cobra catheter and used to selectively catheterize the main left hepatic artery. Selective arteriography was performed. Catheter was positioned appropriately and a second dose of Yttrium-90 SIR Spheres were administered via a new administration system. Monitoring of antegrade flow was performed during administration under fluoroscopy with periodic injection of contrast. Follow-up angiography was performed after dose administration. The microcatheter, outer catheter and administration system were then discarded. Arteriotomy closure was performed with the Cordis ExoSeal device. FINDINGS: Arteriography demonstrates stable arterial  patency and supply to a dominant hepatocellular carcinoma in the superior and posterior aspect of the liver as well as inferior tip of the right lobe. Additional small foci of arterial hypervascularity also again identified in the lateral aspect of the right lobe potentially representing early small foci of carcinoma. The microcatheter was positioned in the right hepatic artery at a level to treat all tumor supply and beyond the origin of the cystic artery. Administration of yttrium 90 microspheres was uncomplicated with full administration of the prescribed dose. Antegrade flow was maintained during treatment and was confirmed by fluoroscopy. COMPLICATIONS: None IMPRESSION: Hepatic arterial radioembolization performed of the right lobe of the liver with Yttrium-90 microspheres to treat hepatocellular carcinoma. Electronically Signed   By: Aletta Edouard M.D.   On: 12/22/2021 16:03   NM Radio Pharm Therapy Intraarterial  Result Date: 12/22/2021 CLINICAL DATA:  Multifocal hepatocellular carcinoma. Initial yttrium 90 microsphere radioembolization to multifocal disease in the RIGHT  hepatic lobe. EXAM: NUCLEAR MEDICINE SPECIAL MED RAD PHYSICS CONS; NUCLEAR MEDICINE RADIO PHARM THERAPY INTRA ARTERIAL; NUCLEAR MEDICINE TREATMENT PROCEDURE; NUCLEAR MEDICINE LIVER SCAN TECHNIQUE: In conjunction with the interventional radiologist a Y- Microsphere dose was calculated utilizing body surface area formulation as well as Recruitment consultant. Specialized pretreatment dosimetry calculations. Calculated dose equal 23.5 mCi. Pre therapy MAA liver SPECT scan and CTA were evaluated. Utilizing a microcatheter system, the hepatic artery was selected and Y-90 microspheres were delivered in fractionated aliquots. Radiopharmaceutical was delivered by the interventional radiologist and nuclear radiologist. The patient tolerated procedure well. No adverse effects were noted. Bremsstrahlung planar and SPECT imaging of the abdomen following intrahepatic arterial delivery of Y-90 microsphere was performed. RADIOPHARMACEUTICALS:  53.7 millicuries yttrium 90 microspheres COMPARISON:  MAA liver scan 12/16/2021, PET scan 12/15/2021, CT 11/10/2021, MRI 10/29/2021 FINDINGS: Y - 90 microspheres therapy as above. First therapy the right hepatic lobe. Bremsstrahlung planar and SPECT imaging of the abdomen following intrahepatic arterial delivery of Y-29mcrosphere demonstrates radioactivity localized to the RIGHT hepatic lobe. No evidence of extrahepatic activity. IMPRESSION: Successful Y - 90 microsphere delivery for treatment of unresectable liver metastasis. First therapy to the RIGHT lobe. Bremssstrahlung scan demonstrates activity localized to RIGHT hepatic lobe with no extrahepatic activity identified. Electronically Signed   By: SSuzy BouchardM.D.   On: 12/22/2021 15:16   NM FUSION  Result Date: 12/16/2021 CLINICAL DATA:  Hepatocellular carcinoma with multifocal disease in the RIGHT hepatic lobe. Preradio embolization mapping. EXAM: NUCLEAR MEDICINE LIVER SCAN; ULTRASOUND MISCELLANEOUS SOFT TISSUE TECHNIQUE:  Abdominal images were obtained in multiple projections after intrahepatic arterial injection of radiopharmaceutical. SPECT imaging was performed. Lung shunt calculation was performed. RADIOPHARMACEUTICALS:  4.378mlicurie MAA TECHNETIUM TO 60M ALBUMIN AGGREGATED COMPARISON:  PET-CT scan 12/15/2021, CT 11/10/2021 FINDINGS: The injected microaggregated albumin localizes within the RIGHT hepatic lobe. No evidence of activity within the stomach, duodenum, or bowel. Calculated shunt fraction to the lungs equals 6.2%. IMPRESSION: 1. No significant extrahepatic radiotracer activity following intrahepatic arterial injection of MAA. 2. Lung shunt fraction equals 6.2% Electronically Signed   By: StSuzy Bouchard.D.   On: 12/16/2021 20:07   IR THORACENTESIS ASP PLEURAL SPACE W/IMG GUIDE  Result Date: 12/22/2021 INDICATION: Patient with history of hepatocellular carcinoma and recurrent left pleural effusion, dyspnea. Request received for therapeutic left thoracentesis prior to planned hepatic Y-90 radioembolization today. EXAM: ULTRASOUND GUIDED THERAPEUTIC LEFT THORACENTESIS MEDICATIONS: 10 mL 1% lidocaine COMPLICATIONS: None immediate. PROCEDURE: An ultrasound guided thoracentesis was thoroughly discussed with the patient and questions answered. The benefits, risks, alternatives and  complications were also discussed. The patient understands and wishes to proceed with the procedure. Written consent was obtained. Ultrasound was performed to localize and mark an adequate pocket of fluid in the left chest. The area was then prepped and draped in the normal sterile fashion. 1% Lidocaine was used for local anesthesia. Under ultrasound guidance a 6 Fr Safe-T-Centesis catheter was introduced. Thoracentesis was performed. The catheter was removed and a dressing applied. FINDINGS: A total of approximately 900 cc of yellow fluid was removed. IMPRESSION: Successful ultrasound guided therapeutic left thoracentesis yielding 900 cc of  pleural fluid. Read by: Rowe Robert, PA-C Electronically Signed   By: Aletta Edouard M.D.   On: 12/22/2021 10:15   US THORACENTESIS ASP PLEURAL SPACE W/IMG GUIDE  Result Date: 01/01/2022 INDICATION: History of hepatocellular carcinoma and recurrent left pleural effusion. Request for therapeutic left thoracentesis prior to planned esophagogastroduodenoscopy. EXAM: ULTRASOUND GUIDED LEFT THORACENTESIS MEDICATIONS: 1% lidocaine 10 mL COMPLICATIONS: None immediate. PROCEDURE: An ultrasound guided thoracentesis was thoroughly discussed with the patient and questions answered. The benefits, risks, alternatives and complications were also discussed. The patient understands and wishes to proceed with the procedure. Written consent was obtained. Ultrasound was performed to localize and mark an adequate pocket of fluid in the left chest. The area was then prepped and draped in the normal sterile fashion. 1% Lidocaine was used for local anesthesia. Under ultrasound guidance a 6 Fr Safe-T-Centesis catheter was introduced. Thoracentesis was performed. The catheter was removed and a dressing applied. FINDINGS: A total of approximately 500 mL of clear yellow fluid was removed. IMPRESSION: Successful ultrasound guided left thoracentesis yielding 500 mL of pleural fluid. Patient had a chest x-ray prior to thoracentesis and she declined post-procedure chest x-ray. Read by: Gareth Eagle, PA-C Electronically Signed   By: Sandi Mariscal M.D.   On: 01/01/2022 15:25   US THORACENTESIS ASP PLEURAL SPACE W/IMG GUIDE  Result Date: 12/14/2021 INDICATION: Recurrent pleural effusion EXAM: ULTRASOUND GUIDED LEFT THORACENTESIS MEDICATIONS: None. COMPLICATIONS: None immediate. PROCEDURE: An ultrasound guided thoracentesis was thoroughly discussed with the patient and questions answered. The benefits, risks, alternatives and complications were also discussed. The patient understands and wishes to proceed with the procedure. Written consent was  obtained. Ultrasound was performed to localize and mark an adequate pocket of fluid in the left chest. The area was then prepped and draped in the normal sterile fashion. 1% Lidocaine was used for local anesthesia. Under ultrasound guidance a 6 Fr Safe-T-Centesis catheter was introduced. Thoracentesis was performed. The catheter was removed and a dressing applied. FINDINGS: A total of approximately 900 cc of clear yellow fluid was removed. IMPRESSION: Successful ultrasound guided left thoracentesis yielding 900 cc of pleural fluid. Performed and dictated by Pasty Spillers, PA-C Electronically Signed   By: Jacqulynn Cadet M.D.   On: 12/14/2021 15:29    Labs:  CBC: Recent Labs    11/01/21 0508 12/01/21 1010 12/16/21 0750 12/22/21 0803  WBC 6.5 5.4 5.1 6.6  HGB 9.2* 10.4* 10.2* 10.9*  HCT 29.2* 32.8* 33.2* 35.8*  PLT 68* 93.0* 77* 92*    COAGS: Recent Labs    10/26/21 2001 10/27/21 0429 10/31/21 0547 12/01/21 1010 12/16/21 0750 12/22/21 0803  INR 1.8*   < > 1.5* 1.3* 1.4* 1.3*  APTT 31  --   --   --   --   --    < > = values in this interval not displayed.    BMP: Recent Labs    10/31/21 0547 11/01/21 0508 12/01/21  1010 12/08/21 1427 12/16/21 0750 12/22/21 0803 01/01/22 1458  NA 131* 133*   < > 135 136 138 138  K 3.2* 3.8   < > 3.1* 3.7 3.2* 4.4  CL 100 104   < > 101 106 104 105  CO2 25 26   < > _0 GLUCOSE 80 118*   < > 200* 103* 107* 94  BUN 10 10   < > 7 5* 5* 6  CALCIUM 7.2* 7.7*   < > 8.2* 8.0* 8.2* 8.8  CREATININE 0.59 0.60   < > 0.63 0.58 0.54 0.57  GFRNONAA >60 >60  --   --  >60 >60  --    < > = values in this interval not displayed.    LIVER FUNCTION TESTS: Recent Labs    11/01/21 0508 12/01/21 1010 12/16/21 0750 12/22/21 0803  BILITOT 1.0 1.3* 1.0 1.6*  AST 29 29 35 36  ALT _1 ALKPHOS 43 83 69 71  PROT 5.5* 6.7 6.4* 7.4  ALBUMIN 2.8* 3.2* 2.9* 3.4*    Assessment and Plan:  I met with Otila Kluver and we discussed follow-up  plans after Y-90 radioembolization of the right lobe of the liver.  I recommended a follow-up MRI in approximately mid April, 3 months after treatment.  She was very encouraged by the EGD findings that demonstrated no residual varices.  I have recommended that we continue to hold off on performing a TIPS procedure unless she were to have significant worsening of the left hepatic hydrothorax.  Currently, with more aggressive diuretic management, the rate of fluid reaccumulation in the left pleural space has definitely decreased. She will let me know if she develops any significant dyspnea.  Electronically Signed: Azzie Roup 01/13/2022, 11:13 AM    I spent a total of 15 Minutes in face to face in clinical consultation, greater than 50% of which was counseling/coordinating care for hepatocellular carcinoma.

## 2022-01-19 ENCOUNTER — Telehealth: Payer: Self-pay

## 2022-01-19 NOTE — Telephone Encounter (Signed)
Patient is needing a refill of the potassium. She tried to get a refill from her PCP and was told that whomever "has been prescribing should continue to prescribe." I offered to call PCP for her. She declines this offer. She wants to ask her hepatologist at [place] about the diuretics and potassium first. She will ask if the management of her potassium can come from there as well. If management is deferred, she will call me back.

## 2022-01-26 DIAGNOSIS — K7469 Other cirrhosis of liver: Secondary | ICD-10-CM | POA: Diagnosis not present

## 2022-01-26 DIAGNOSIS — J90 Pleural effusion, not elsewhere classified: Secondary | ICD-10-CM | POA: Diagnosis not present

## 2022-02-08 ENCOUNTER — Other Ambulatory Visit (HOSPITAL_COMMUNITY): Payer: Self-pay | Admitting: Interventional Radiology

## 2022-02-08 DIAGNOSIS — J90 Pleural effusion, not elsewhere classified: Secondary | ICD-10-CM

## 2022-02-10 ENCOUNTER — Other Ambulatory Visit: Payer: Self-pay

## 2022-02-10 ENCOUNTER — Ambulatory Visit (HOSPITAL_COMMUNITY)
Admission: RE | Admit: 2022-02-10 | Discharge: 2022-02-10 | Disposition: A | Discharge status: Home / Self Care | Payer: BC Managed Care – PPO | Source: Ambulatory Visit | Attending: Internal Medicine | Admitting: Internal Medicine

## 2022-02-10 ENCOUNTER — Ambulatory Visit (HOSPITAL_COMMUNITY)
Admission: RE | Admit: 2022-02-10 | Discharge: 2022-02-10 | Disposition: A | Discharge status: Home / Self Care | Payer: BC Managed Care – PPO | Source: Ambulatory Visit | Attending: Interventional Radiology | Admitting: Interventional Radiology

## 2022-02-10 DIAGNOSIS — Z9889 Other specified postprocedural states: Secondary | ICD-10-CM

## 2022-02-10 DIAGNOSIS — J90 Pleural effusion, not elsewhere classified: Secondary | ICD-10-CM | POA: Insufficient documentation

## 2022-02-10 DIAGNOSIS — R188 Other ascites: Secondary | ICD-10-CM | POA: Diagnosis not present

## 2022-02-10 DIAGNOSIS — C50111 Malignant neoplasm of central portion of right female breast: Secondary | ICD-10-CM | POA: Diagnosis not present

## 2022-02-10 DIAGNOSIS — Z8505 Personal history of malignant neoplasm of liver: Secondary | ICD-10-CM | POA: Insufficient documentation

## 2022-02-10 DIAGNOSIS — J9811 Atelectasis: Secondary | ICD-10-CM | POA: Diagnosis not present

## 2022-02-10 DIAGNOSIS — K7460 Unspecified cirrhosis of liver: Secondary | ICD-10-CM | POA: Diagnosis not present

## 2022-02-10 DIAGNOSIS — J439 Emphysema, unspecified: Secondary | ICD-10-CM | POA: Diagnosis not present

## 2022-02-10 DIAGNOSIS — C220 Liver cell carcinoma: Secondary | ICD-10-CM | POA: Diagnosis not present

## 2022-02-10 MED ORDER — LIDOCAINE HCL 1 % IJ SOLN
INTRAMUSCULAR | Status: AC
  Filled 2022-02-10: qty 20

## 2022-02-10 NOTE — Procedures (Signed)
PROCEDURE SUMMARY: ? ?Successful US guided therapeutic thoracentesis. ?Yielded 1.2 L of clear, yellow fluid. ?Pt tolerated procedure well. ?No immediate complications. ? ?Specimen not sent for labs. ?CXR ordered. ? ?EBL < 1 mL ? ?Tyson Alias, AGNP ?02/10/2022 ?2:26 PM ? ?  ? ?

## 2022-02-12 ENCOUNTER — Other Ambulatory Visit: Payer: Self-pay | Admitting: Interventional Radiology

## 2022-02-12 DIAGNOSIS — C220 Liver cell carcinoma: Secondary | ICD-10-CM

## 2022-02-16 DIAGNOSIS — J90 Pleural effusion, not elsewhere classified: Secondary | ICD-10-CM | POA: Diagnosis not present

## 2022-02-16 DIAGNOSIS — I8511 Secondary esophageal varices with bleeding: Secondary | ICD-10-CM | POA: Diagnosis not present

## 2022-02-16 DIAGNOSIS — C220 Liver cell carcinoma: Secondary | ICD-10-CM | POA: Diagnosis not present

## 2022-02-16 DIAGNOSIS — K7469 Other cirrhosis of liver: Secondary | ICD-10-CM | POA: Diagnosis not present

## 2022-02-19 ENCOUNTER — Other Ambulatory Visit (HOSPITAL_COMMUNITY): Payer: Self-pay | Admitting: Interventional Radiology

## 2022-02-19 ENCOUNTER — Ambulatory Visit (HOSPITAL_COMMUNITY)
Admission: RE | Admit: 2022-02-19 | Discharge: 2022-02-19 | Disposition: A | Discharge status: Home / Self Care | Payer: BC Managed Care – PPO | Source: Ambulatory Visit | Attending: Radiology | Admitting: Radiology

## 2022-02-19 ENCOUNTER — Other Ambulatory Visit (HOSPITAL_COMMUNITY): Payer: BC Managed Care – PPO

## 2022-02-19 ENCOUNTER — Ambulatory Visit (HOSPITAL_COMMUNITY)
Admission: RE | Admit: 2022-02-19 | Discharge: 2022-02-19 | Disposition: A | Discharge status: Home / Self Care | Payer: BC Managed Care – PPO | Source: Ambulatory Visit | Attending: Interventional Radiology | Admitting: Interventional Radiology

## 2022-02-19 ENCOUNTER — Other Ambulatory Visit: Payer: Self-pay

## 2022-02-19 DIAGNOSIS — K766 Portal hypertension: Secondary | ICD-10-CM | POA: Diagnosis not present

## 2022-02-19 DIAGNOSIS — C220 Liver cell carcinoma: Secondary | ICD-10-CM

## 2022-02-19 DIAGNOSIS — J449 Chronic obstructive pulmonary disease, unspecified: Secondary | ICD-10-CM | POA: Diagnosis not present

## 2022-02-19 DIAGNOSIS — Z79899 Other long term (current) drug therapy: Secondary | ICD-10-CM | POA: Diagnosis not present

## 2022-02-19 DIAGNOSIS — J9811 Atelectasis: Secondary | ICD-10-CM | POA: Diagnosis not present

## 2022-02-19 DIAGNOSIS — J90 Pleural effusion, not elsewhere classified: Secondary | ICD-10-CM | POA: Diagnosis not present

## 2022-02-19 DIAGNOSIS — J432 Centrilobular emphysema: Secondary | ICD-10-CM | POA: Diagnosis not present

## 2022-02-19 DIAGNOSIS — R0602 Shortness of breath: Secondary | ICD-10-CM | POA: Diagnosis not present

## 2022-02-19 DIAGNOSIS — R918 Other nonspecific abnormal finding of lung field: Secondary | ICD-10-CM | POA: Diagnosis not present

## 2022-02-19 DIAGNOSIS — Z853 Personal history of malignant neoplasm of breast: Secondary | ICD-10-CM | POA: Diagnosis not present

## 2022-02-19 DIAGNOSIS — D696 Thrombocytopenia, unspecified: Secondary | ICD-10-CM | POA: Diagnosis not present

## 2022-02-19 DIAGNOSIS — E876 Hypokalemia: Secondary | ICD-10-CM | POA: Diagnosis not present

## 2022-02-19 DIAGNOSIS — R9431 Abnormal electrocardiogram [ECG] [EKG]: Secondary | ICD-10-CM | POA: Diagnosis not present

## 2022-02-19 DIAGNOSIS — E441 Mild protein-calorie malnutrition: Secondary | ICD-10-CM | POA: Diagnosis not present

## 2022-02-19 DIAGNOSIS — I7300 Raynaud's syndrome without gangrene: Secondary | ICD-10-CM | POA: Diagnosis not present

## 2022-02-19 DIAGNOSIS — Z20822 Contact with and (suspected) exposure to covid-19: Secondary | ICD-10-CM | POA: Diagnosis not present

## 2022-02-19 DIAGNOSIS — K7030 Alcoholic cirrhosis of liver without ascites: Secondary | ICD-10-CM | POA: Diagnosis not present

## 2022-02-19 DIAGNOSIS — D500 Iron deficiency anemia secondary to blood loss (chronic): Secondary | ICD-10-CM | POA: Diagnosis not present

## 2022-02-19 DIAGNOSIS — F17210 Nicotine dependence, cigarettes, uncomplicated: Secondary | ICD-10-CM | POA: Diagnosis not present

## 2022-02-19 MED ORDER — LIDOCAINE HCL 1 % IJ SOLN
INTRAMUSCULAR | Status: AC
  Administered 2022-02-19: 15 mL
  Filled 2022-02-19: qty 20

## 2022-02-19 NOTE — Procedures (Signed)
Ultrasound-guided therapeutic left thoracentesis performed yielding 1.2 liters of yellow  fluid. No immediate complications. Follow-up chest x-ray pending. EBL none.      ? ?

## 2022-02-21 ENCOUNTER — Observation Stay (HOSPITAL_COMMUNITY)
Admission: EM | Admit: 2022-02-21 | Discharge: 2022-02-23 | Disposition: A | Discharge status: Home / Self Care | Payer: BC Managed Care – PPO | Attending: Internal Medicine | Admitting: Internal Medicine

## 2022-02-21 ENCOUNTER — Emergency Department (HOSPITAL_COMMUNITY): Payer: BC Managed Care – PPO

## 2022-02-21 ENCOUNTER — Encounter (HOSPITAL_COMMUNITY): Payer: Self-pay | Admitting: Emergency Medicine

## 2022-02-21 ENCOUNTER — Other Ambulatory Visit: Payer: Self-pay

## 2022-02-21 DIAGNOSIS — E871 Hypo-osmolality and hyponatremia: Secondary | ICD-10-CM

## 2022-02-21 DIAGNOSIS — C220 Liver cell carcinoma: Secondary | ICD-10-CM | POA: Insufficient documentation

## 2022-02-21 DIAGNOSIS — K766 Portal hypertension: Secondary | ICD-10-CM | POA: Insufficient documentation

## 2022-02-21 DIAGNOSIS — R9431 Abnormal electrocardiogram [ECG] [EKG]: Secondary | ICD-10-CM

## 2022-02-21 DIAGNOSIS — D649 Anemia, unspecified: Secondary | ICD-10-CM | POA: Diagnosis present

## 2022-02-21 DIAGNOSIS — Z853 Personal history of malignant neoplasm of breast: Secondary | ICD-10-CM | POA: Insufficient documentation

## 2022-02-21 DIAGNOSIS — Z79899 Other long term (current) drug therapy: Secondary | ICD-10-CM | POA: Insufficient documentation

## 2022-02-21 DIAGNOSIS — Z9981 Dependence on supplemental oxygen: Secondary | ICD-10-CM

## 2022-02-21 DIAGNOSIS — J948 Other specified pleural conditions: Principal | ICD-10-CM | POA: Diagnosis present

## 2022-02-21 DIAGNOSIS — E441 Mild protein-calorie malnutrition: Secondary | ICD-10-CM | POA: Insufficient documentation

## 2022-02-21 DIAGNOSIS — R161 Splenomegaly, not elsewhere classified: Secondary | ICD-10-CM | POA: Diagnosis present

## 2022-02-21 DIAGNOSIS — F17210 Nicotine dependence, cigarettes, uncomplicated: Secondary | ICD-10-CM | POA: Insufficient documentation

## 2022-02-21 DIAGNOSIS — K7031 Alcoholic cirrhosis of liver with ascites: Secondary | ICD-10-CM | POA: Diagnosis present

## 2022-02-21 DIAGNOSIS — Z923 Personal history of irradiation: Secondary | ICD-10-CM

## 2022-02-21 DIAGNOSIS — E739 Lactose intolerance, unspecified: Secondary | ICD-10-CM | POA: Diagnosis present

## 2022-02-21 DIAGNOSIS — D500 Iron deficiency anemia secondary to blood loss (chronic): Secondary | ICD-10-CM

## 2022-02-21 DIAGNOSIS — J9811 Atelectasis: Secondary | ICD-10-CM | POA: Diagnosis not present

## 2022-02-21 DIAGNOSIS — Z800 Family history of malignant neoplasm of digestive organs: Secondary | ICD-10-CM

## 2022-02-21 DIAGNOSIS — I7300 Raynaud's syndrome without gangrene: Secondary | ICD-10-CM | POA: Insufficient documentation

## 2022-02-21 DIAGNOSIS — D696 Thrombocytopenia, unspecified: Secondary | ICD-10-CM | POA: Insufficient documentation

## 2022-02-21 DIAGNOSIS — E876 Hypokalemia: Secondary | ICD-10-CM | POA: Insufficient documentation

## 2022-02-21 DIAGNOSIS — F17200 Nicotine dependence, unspecified, uncomplicated: Secondary | ICD-10-CM | POA: Diagnosis present

## 2022-02-21 DIAGNOSIS — J90 Pleural effusion, not elsewhere classified: Principal | ICD-10-CM | POA: Insufficient documentation

## 2022-02-21 DIAGNOSIS — E43 Unspecified severe protein-calorie malnutrition: Secondary | ICD-10-CM | POA: Diagnosis present

## 2022-02-21 DIAGNOSIS — B1920 Unspecified viral hepatitis C without hepatic coma: Secondary | ICD-10-CM | POA: Diagnosis present

## 2022-02-21 DIAGNOSIS — Z886 Allergy status to analgesic agent status: Secondary | ICD-10-CM

## 2022-02-21 DIAGNOSIS — J432 Centrilobular emphysema: Secondary | ICD-10-CM | POA: Diagnosis not present

## 2022-02-21 DIAGNOSIS — Z801 Family history of malignant neoplasm of trachea, bronchus and lung: Secondary | ICD-10-CM

## 2022-02-21 DIAGNOSIS — K580 Irritable bowel syndrome with diarrhea: Secondary | ICD-10-CM | POA: Diagnosis present

## 2022-02-21 DIAGNOSIS — R0602 Shortness of breath: Secondary | ICD-10-CM | POA: Diagnosis not present

## 2022-02-21 DIAGNOSIS — C50911 Malignant neoplasm of unspecified site of right female breast: Secondary | ICD-10-CM | POA: Diagnosis present

## 2022-02-21 DIAGNOSIS — K7030 Alcoholic cirrhosis of liver without ascites: Secondary | ICD-10-CM | POA: Insufficient documentation

## 2022-02-21 DIAGNOSIS — Z681 Body mass index (BMI) 19 or less, adult: Secondary | ICD-10-CM

## 2022-02-21 DIAGNOSIS — J449 Chronic obstructive pulmonary disease, unspecified: Secondary | ICD-10-CM | POA: Insufficient documentation

## 2022-02-21 DIAGNOSIS — K589 Irritable bowel syndrome without diarrhea: Secondary | ICD-10-CM | POA: Diagnosis present

## 2022-02-21 DIAGNOSIS — Z20822 Contact with and (suspected) exposure to covid-19: Secondary | ICD-10-CM | POA: Insufficient documentation

## 2022-02-21 DIAGNOSIS — Z885 Allergy status to narcotic agent status: Secondary | ICD-10-CM

## 2022-02-21 DIAGNOSIS — D6959 Other secondary thrombocytopenia: Secondary | ICD-10-CM | POA: Diagnosis present

## 2022-02-21 DIAGNOSIS — Z8505 Personal history of malignant neoplasm of liver: Secondary | ICD-10-CM

## 2022-02-21 LAB — DIFFERENTIAL
Abs Immature Granulocytes: 0.02 10*3/uL (ref 0.00–0.07)
Basophils Absolute: 0 10*3/uL (ref 0.0–0.1)
Basophils Relative: 1 %
Eosinophils Absolute: 0 10*3/uL (ref 0.0–0.5)
Eosinophils Relative: 1 %
Immature Granulocytes: 0 %
Lymphocytes Relative: 9 %
Lymphs Abs: 0.6 10*3/uL — ABNORMAL LOW (ref 0.7–4.0)
Monocytes Absolute: 0.7 10*3/uL (ref 0.1–1.0)
Monocytes Relative: 12 %
Neutro Abs: 4.9 10*3/uL (ref 1.7–7.7)
Neutrophils Relative %: 77 %

## 2022-02-21 LAB — CBC
HCT: 33.9 % — ABNORMAL LOW (ref 36.0–46.0)
Hemoglobin: 11 g/dL — ABNORMAL LOW (ref 12.0–15.0)
MCH: 25.2 pg — ABNORMAL LOW (ref 26.0–34.0)
MCHC: 32.4 g/dL (ref 30.0–36.0)
MCV: 77.6 fL — ABNORMAL LOW (ref 80.0–100.0)
Platelets: 93 10*3/uL — ABNORMAL LOW (ref 150–400)
RBC: 4.37 MIL/uL (ref 3.87–5.11)
RDW: 21.7 % — ABNORMAL HIGH (ref 11.5–15.5)
WBC: 6.3 10*3/uL (ref 4.0–10.5)
nRBC: 0 % (ref 0.0–0.2)

## 2022-02-21 LAB — RESP PANEL BY RT-PCR (FLU A&B, COVID) ARPGX2
Influenza A by PCR: NEGATIVE
Influenza B by PCR: NEGATIVE
SARS Coronavirus 2 by RT PCR: NEGATIVE

## 2022-02-21 LAB — COMPREHENSIVE METABOLIC PANEL
ALT: 25 U/L (ref 0–44)
AST: 55 U/L — ABNORMAL HIGH (ref 15–41)
Albumin: 3 g/dL — ABNORMAL LOW (ref 3.5–5.0)
Alkaline Phosphatase: 96 U/L (ref 38–126)
Anion gap: 11 (ref 5–15)
BUN: 7 mg/dL (ref 6–20)
CO2: 27 mmol/L (ref 22–32)
Calcium: 7.7 mg/dL — ABNORMAL LOW (ref 8.9–10.3)
Chloride: 89 mmol/L — ABNORMAL LOW (ref 98–111)
Creatinine, Ser: 0.42 mg/dL — ABNORMAL LOW (ref 0.44–1.00)
GFR, Estimated: >60 mL/min (ref >60)
Glucose, Bld: 105 mg/dL — ABNORMAL HIGH (ref 70–99)
Potassium: 2.5 mmol/L — CL (ref 3.5–5.1)
Sodium: 127 mmol/L — ABNORMAL LOW (ref 135–145)
Total Bilirubin: 1.6 mg/dL — ABNORMAL HIGH (ref 0.3–1.2)
Total Protein: 7 g/dL (ref 6.5–8.1)

## 2022-02-21 LAB — PROTIME-INR
INR: 1.6 — ABNORMAL HIGH (ref 0.8–1.2)
Prothrombin Time: 18.6 seconds — ABNORMAL HIGH (ref 11.4–15.2)

## 2022-02-21 LAB — MAGNESIUM: Magnesium: 1.8 mg/dL (ref 1.7–2.4)

## 2022-02-21 MED ORDER — PROCHLORPERAZINE EDISYLATE 10 MG/2ML IJ SOLN: 5.0000 mg | INTRAMUSCULAR | Status: DC | PRN

## 2022-02-21 MED ORDER — ACETAMINOPHEN 650 MG RE SUPP
650.0000 mg | Freq: Four times a day (QID) | RECTAL | Status: DC | PRN
Start: 2022-02-21 — End: 2022-02-23

## 2022-02-21 MED ORDER — ONDANSETRON HCL 4 MG PO TABS: 4.0000 mg | ORAL_TABLET | Freq: Four times a day (QID) | ORAL | Status: DC | PRN

## 2022-02-21 MED ORDER — LOPERAMIDE HCL 2 MG PO CAPS: 4.0000 mg | ORAL_CAPSULE | Freq: Every day | ORAL | Status: DC | PRN

## 2022-02-21 MED ORDER — LIDOCAINE 5 % EX PTCH
2.0000 | MEDICATED_PATCH | CUTANEOUS | Status: DC
  Administered 2022-02-22 – 2022-02-23 (×2): 2 via TRANSDERMAL
  Filled 2022-02-21 (×3): qty 2

## 2022-02-21 MED ORDER — POTASSIUM CHLORIDE 10 MEQ/100ML IV SOLN
10.0000 meq | INTRAVENOUS | Status: AC
  Administered 2022-02-21 (×3): 10 meq via INTRAVENOUS
  Filled 2022-02-21 (×3): qty 100

## 2022-02-21 MED ORDER — NICOTINE 21 MG/24HR TD PT24
21.0000 mg | MEDICATED_PATCH | Freq: Every day | TRANSDERMAL | Status: DC | PRN
  Administered 2022-02-22 – 2022-02-23 (×2): 21 mg via TRANSDERMAL
  Filled 2022-02-21 (×2): qty 1

## 2022-02-21 MED ORDER — PANTOPRAZOLE SODIUM 40 MG PO TBEC
40.0000 mg | DELAYED_RELEASE_TABLET | Freq: Every day | ORAL | Status: DC
  Administered 2022-02-21 – 2022-02-23 (×3): 40 mg via ORAL
  Filled 2022-02-21 (×3): qty 1

## 2022-02-21 MED ORDER — ACETAMINOPHEN 325 MG PO TABS
650.0000 mg | ORAL_TABLET | Freq: Four times a day (QID) | ORAL | Status: DC | PRN
  Administered 2022-02-21 – 2022-02-23 (×2): 650 mg via ORAL
  Filled 2022-02-21 (×2): qty 2

## 2022-02-21 MED ORDER — IOHEXOL 300 MG/ML  SOLN
75.0000 mL | Freq: Once | INTRAMUSCULAR | Status: AC | PRN
  Administered 2022-02-21: 75 mL via INTRAVENOUS

## 2022-02-21 MED ORDER — DICYCLOMINE HCL 20 MG PO TABS: 20.0000 mg | ORAL_TABLET | Freq: Every day | ORAL | Status: DC | PRN

## 2022-02-21 MED ORDER — MAGNESIUM SULFATE 2 GM/50ML IV SOLN
2.0000 g | Freq: Once | INTRAVENOUS | Status: AC
  Administered 2022-02-21: 2 g via INTRAVENOUS
  Filled 2022-02-21: qty 50

## 2022-02-21 MED ORDER — POTASSIUM CHLORIDE CRYS ER 20 MEQ PO TBCR
40.0000 meq | EXTENDED_RELEASE_TABLET | Freq: Once | ORAL | Status: AC
  Administered 2022-02-21: 40 meq via ORAL
  Filled 2022-02-21: qty 2

## 2022-02-21 MED ORDER — POTASSIUM CHLORIDE CRYS ER 20 MEQ PO TBCR
40.0000 meq | EXTENDED_RELEASE_TABLET | Freq: Every day | ORAL | Status: AC
  Administered 2022-02-21: 40 meq via ORAL
  Filled 2022-02-21: qty 2

## 2022-02-21 MED ORDER — ONDANSETRON HCL 4 MG/2ML IJ SOLN: 4.0000 mg | Freq: Four times a day (QID) | INTRAMUSCULAR | Status: DC | PRN

## 2022-02-21 MED ORDER — TAMOXIFEN CITRATE 10 MG PO TABS
20.0000 mg | ORAL_TABLET | Freq: Every evening | ORAL | Status: DC
  Administered 2022-02-21 – 2022-02-22 (×2): 20 mg via ORAL
  Filled 2022-02-21 (×3): qty 2

## 2022-02-21 NOTE — ED Notes (Signed)
Pt noted increased SOB while supine. ?

## 2022-02-21 NOTE — Progress Notes (Signed)
?   02/21/22 1455  ?Assess: MEWS Score  ?Temp 98.5 ?F (36.9 ?C)  ?BP 103/65  ?Pulse Rate 95  ?Level of Consciousness Alert  ?SpO2 100 %  ?O2 Device Nasal Cannula  ?O2 Flow Rate (L/min) 2 L/min  ?Assess: MEWS Score  ?MEWS Temp 0  ?MEWS Systolic 0  ?MEWS Pulse 0  ?MEWS RR 2  ?MEWS LOC 0  ?MEWS Score 2  ?MEWS Score Color Yellow  ?Treat  ?Pain Scale 0-10  ?Pain Score 0  ?Assess: SIRS CRITERIA  ?SIRS Temperature  0  ?SIRS Pulse 1  ?SIRS Respirations  1  ?SIRS WBC 0  ?SIRS Score Sum  2  ? ?Patient's first set of vitals signs on the floor were yellow due to high respiratory rate. This is no change from when she was in the ER. Talked with charge RN. Will continue with new admission vital frequency, q 4 hrs instead of yellow mews. Patient in no distress, on 2L of oxygen, no pain. ?

## 2022-02-21 NOTE — ED Notes (Signed)
Pt satting 91-placed on 2L [place]- up to 96. ?

## 2022-02-21 NOTE — ED Provider Notes (Signed)
?[facility] DEPT ?Provider Note ? ? ?CSN: [id number] ?Arrival date & time: 02/21/22  1478 ? ?  ? ?History ? ?Chief Complaint  ?Patient presents with  ? Shortness of Breath  ? ? ?Theresa Rocha is a 56 y.o. female. ? ?Theresa Rocha is a 56 y.o. 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  Patient reports that she had a thoracentesis done 2 days ago for a left-sided pleural effusion that has been recurrent ever since she had an ablation done on her liver.  She reports that she had relief of shortness of breath after the thoracentesis but then yesterday started feeling significantly more short of breath again.  She reports that she has had a cough that is intermittently productive.  She denies associated chest pain.  No abdominal pain or swelling.  Does report a fever up to 101 at home, but is afebrile on arrival.  No known sick contacts.  No lower extremity swelling.  No other aggravating or alleviating factors. ? ?The history is provided by the patient and medical records.  ? ?  ? ?Home Medications ?Prior to Admission medications   ?Medication Sig Start Date End Date Taking? Authorizing Provider  ?dicyclomine (BENTYL) 20 MG tablet Take 0.5 tablets (10 mg total) by mouth every 6 (six) hours as needed for spasms. Take one tablet every 6-8 hours as needed for abdominal cramping 01/07/22   Noralyn Pick, NP  ?eplerenone (INSPRA) 25 MG tablet Take 50 mg by mouth daily. 11/18/21   [provider]  ?furosemide (LASIX) 40 MG tablet Take 40 mg by mouth.    [provider]  ?loperamide (IMODIUM) 2 MG capsule Take 2 mg by mouth as needed for diarrhea or loose stools.    [provider]  ?midodrine (PROAMATINE) 10 MG tablet Take 10 mg by mouth daily.    [provider]  ?nicotine (NICODERM CQ - DOSED IN MG/24 HOURS)  21 mg/24hr patch Place 21 mg onto the skin daily as needed (smoking cessation on work days).    [provider]  ?ondansetron (ZOFRAN-ODT) 4 MG disintegrating tablet Dissolve one tablet on the tongue every 8 hours as needed for nausea and vomiting. 12/01/21   Noralyn Pick, NP  ?pantoprazole (PROTONIX) 40 MG tablet Take 1 tablet (40 mg total) by mouth daily. 12/01/21   Noralyn Pick, NP  ?potassium chloride SA (KLOR-CON M) 20 MEQ tablet Take 1 tablet (20 mEq total) by mouth 2 (two) times daily. ?Patient taking differently: Take 40 mEq by mouth daily. 12/28/21 01/27/22  Noralyn Pick, NP  ?sucralfate (CARAFATE) 1 GM/10ML suspension Take 1 g by mouth 2 (two) times daily.    [provider]  ?tamoxifen (NOLVADEX) 20 MG tablet TAKE 1 TABLET BY MOUTH EVERY DAY ?Patient taking differently: Take 20 mg by mouth every morning. 10/13/20   Truitt Merle, MD  ?traZODone (DESYREL) 100 MG tablet Take 1 tablet (100 mg total) by mouth at bedtime as needed for sleep. ?Patient not taking: Reported on 12/29/2021 12/01/21   Truitt Merle, MD  ?   ? ?Allergies    ?Hydromorphone hcl and Aspirin   ? ?Review of Systems   ?Review of Systems  ?Constitutional:  Positive for chills and fever.  ?HENT: Negative.    ?Respiratory:  Positive for cough and shortness of breath. Negative for wheezing.   ?Cardiovascular:  Negative for chest pain and leg swelling.  ?Gastrointestinal:  Negative for abdominal pain, nausea and vomiting.  ?Genitourinary:  Negative for dysuria and frequency.  ?Musculoskeletal:  Negative for arthralgias and myalgias.  ?Skin:  Negative for color change and rash.  ?Neurological:  Negative for tremors, weakness and light-headedness.  ?All other systems reviewed and are negative. ? ?Physical Exam ?Updated Vital Signs ?BP 109/79   Pulse (!) 112   Temp 98.2 ?F (36.8 ?C) (Oral)   Resp (!) 24   LMP 04/20/2013   SpO2 96%  ?Physical Exam ?Vitals and nursing note reviewed.  ?Constitutional:   ?    General: She is not in acute distress. ?   Appearance: Normal appearance. She is well-developed. She is not ill-appearing or diaphoretic.  ?HENT:  ?   Head: Normocephalic and atraumatic.  ?Eyes:  ?   General:     ?   Right eye: No discharge.     ?   Left eye: No discharge.  ?   Pupils: Pupils are equal, round, and reactive to light.  ?Cardiovascular:  ?   Rate and Rhythm: Normal rate and regular rhythm.  ?   Pulses: Normal pulses.  ?   Heart sounds: Normal heart sounds.  ?Pulmonary:  ?   Effort: Pulmonary effort is normal. No respiratory distress.  ?   Breath sounds: Examination of the left-middle field reveals decreased breath sounds. Examination of the left-lower field reveals decreased breath sounds. Decreased breath sounds present. No wheezing or rales.  ?   Comments: Respirations equal and unlabored, patient able to speak in full sentences, decreased breath sounds in the left mid and lower lung fields, right lung  clear with normal breath sounds ?Abdominal:  ?   General: Bowel sounds are normal. There is no distension.  ?   Palpations: Abdomen is soft. There is no mass.  ?   Tenderness: There is no abdominal tenderness. There is no guarding.  ?   Comments: Abdomen soft, nondistended, no fluid wave, nontender to palpation in all quadrants without guarding or peritoneal signs  ?Musculoskeletal:     ?   General: No deformity.  ?   Cervical back: Neck supple.  ?Skin: ?   General: Skin is warm and dry.  ?   Capillary Refill: Capillary refill takes less than 2 seconds.  ?Neurological:  ?   Mental Status: She is alert and oriented to person, place, and time.  ?   Coordination: Coordination normal.  ?   Comments: Speech is clear, able to follow commands ?Moves extremities without ataxia, coordination intact  ?Psychiatric:     ?   Mood and Affect: Mood normal.     ?   Behavior: Behavior normal.  ? ? ?ED Results / Procedures / Treatments   ?Labs ?(all labs ordered are listed, but only abnormal results are  displayed) ?Labs Reviewed  ?COMPREHENSIVE METABOLIC PANEL - Abnormal; Notable for the following components:  ?    Result Value  ? Sodium 127 (*)   ? Potassium 2.5 (*)   ? Chloride 89 (*)   ? Glucose, Bld 105 (*)   ? Creatinine, Ser 0.42 (*)   ? Calcium 7.7 (*)   ? Albumin 3.0 (*)   ? AST 55 (*)   ? Total Bilirubin 1.6 (*)   ? All other components within normal limits  ?PROTIME-INR - Abnormal; Notable for the following components:  ? Prothrombin Time 18.6 (*)   ? INR 1.6 (*)   ?  All other components within normal limits  ?CBC - Abnormal; Notable for the following components:  ? Hemoglobin 11.0 (*)   ? HCT 33.9 (*)   ? MCV 77.6 (*)   ? MCH 25.2 (*)   ? RDW 21.7 (*)   ? Platelets 93 (*)   ? All other components within normal limits  ?DIFFERENTIAL - Abnormal; Notable for the following components:  ? Lymphs Abs 0.6 (*)   ? All other components within normal limits  ?CBC - Abnormal; Notable for the following components:  ? Hemoglobin 10.4 (*)   ? HCT 33.2 (*)   ? MCV 79.2 (*)   ? MCH 24.8 (*)   ? RDW 22.3 (*)   ? Platelets 78 (*)   ? All other components within normal limits  ?COMPREHENSIVE METABOLIC PANEL - Abnormal; Notable for the following components:  ? Sodium 131 (*)   ? Glucose, Bld 102 (*)   ? Creatinine, Ser 0.39 (*)   ? Calcium 7.5 (*)   ? Total Protein 5.6 (*)   ? Albumin 2.4 (*)   ? AST 43 (*)   ? Total Bilirubin 1.6 (*)   ? Anion gap 4 (*)   ? All other components within normal limits  ?BODY FLUID CELL COUNT WITH DIFFERENTIAL - Abnormal; Notable for the following components:  ? Neutrophil Count, Fluid 26 (*)   ? All other components within normal limits  ?RESP PANEL BY RT-PCR (FLU A&B, COVID) ARPGX2  ?CULTURE, FUNGUS WITHOUT SMEAR  ?MAGNESIUM  ?LACTATE DEHYDROGENASE  ?GLUCOSE, PLEURAL OR PERITONEAL FLUID  ?PROTEIN, PLEURAL OR PERITONEAL FLUID  ?CBC WITH DIFFERENTIAL/PLATELET  ?CBC  ?COMPREHENSIVE METABOLIC PANEL  ?MAGNESIUM  ?CYTOLOGY - NON PAP  ? ? ?EKG ?None ? ?Radiology ? ?DG Chest 2 View ? ?Result Date:  02/21/2022 ?CLINICAL DATA:  Shortness of breath EXAM: CHEST - 2 VIEW COMPARISON:  Chest x-ray 02/19/2022 FINDINGS: Heart size and mediastinum appear to be grossly unchanged, limited visualization on the left. Interval enlarge

## 2022-02-21 NOTE — ED Triage Notes (Signed)
Patient reports SOB with cough. Had thoracentesis x2 days ago. ?

## 2022-02-21 NOTE — ED Notes (Signed)
ED Provider at bedside. 

## 2022-02-21 NOTE — H&P (Signed)
?History and Physical  ? ? ?Patient: Theresa Rocha:[id number] DOB: [birth year] ?DOA: 02/21/2022 ?DOS: the patient was seen and examined on 02/21/2022 ?PCP: Seward Carol, MD  ?Patient coming from: Home ? ?Chief Complaint:  ?Chief Complaint  ?Patient presents with  ? Shortness of Breath  ? ?HPI: Theresa Rocha is a 56 y.o. female with medical history significant of chronic blood loss anemia, history of breast cancer, cervicalgia, COPD, GERD, headache hepatic cirrhosis due to hepatitis C (treated in 2018, hepatocellular carcinoma, IBS, portal hypertension, Raynaud's syndrome, thrombocytopenia, recurring left pleural effusion who is coming to the emergency department due to dyspnea with little exertion, chest pressure sensation, orthopnea and PND.  She has had 2 recent paracenteses 2 and 11 days ago respectively.  No fever, chills, renal area, sore throat, wheezing or hemoptysis.  She gets occasionally nauseous, has frequent diarrhea with IBS.  Denied constipation, melena or hematochezia.  No flank pain, dysuria, frequency or hematuria.  No polyuria, polydipsia, polyphagia or blurred vision. ? ?ED course: Initial vital signs were temperature 98.2 ?F, pulse 112, respirations 24, BP 109/79 mmHg O2 sat 96% on room air.  Patient was placed on supplemental oxygen for comfort, given 40 mEq of KCl and 2 g of magnesium sulfate IVPB.  IR on call was consulted.  They recommended for the hospitalist service to call Dr. Kathlene Cote (who is familiar with the patient) in AM. ? ?Lab work: CBC with a white count 6.3, hemoglobin 11.0 g/dL platelets 93.  PT 18.6 INR 1.6.  CMP shows a sodium 127, potassium 2.5 and chloride 89 mmol/L.  Albumin was 3.0 g/dL, AST 55 U/L and total bilirubin 1.6 mg/dL. ? ?Imaging: 2 view chest radiograph showed interval enlargement of large left pleural effusion.  CT of the chest with contrast again demonstrated large left pleural effusion, simple in appearance with extensive passive atelectasis in the left lung.   Centrilobular and paraseptal emphysema.  Aortic atherosclerosis.  Advanced cirrhosis.  Posttreatment related changes of the liver lesions.  Please see images and full radiology report for further details. ?  ?Review of Systems: As mentioned in the history of present illness. All other systems reviewed and are negative. ?Past Medical History:  ?Diagnosis Date  ? Anemia   ? Breast cancer ([place]) 08/30/2019  ? [facility])   ? liver- just diagnosed, not started treatment yet  ? Cervicalgia   ? COPD (chronic obstructive pulmonary disease) ([place])   ? Esophageal varices ([facility])   ? GERD (gastroesophageal reflux disease)   ? Headache   ? Hepatic cirrhosis ([place])   ? Hepatitis C   ? resolved 2018  ? Hepatocellular carcinoma ([place])   ? IBS (irritable bowel syndrome)   ? Lactose intolerance   ? Personal history of radiation therapy 11/2019  ? Right breast  ? Portal hypertension ([place])   ? Raynaud's syndrome   ? Thrombocytopenia ([place])   ? ?Past Surgical History:  ?Procedure Laterality Date  ? BREAST BIOPSY Right 09/2019  ? BREAST LUMPECTOMY Right 09/2019  ? BREAST LUMPECTOMY WITH AXILLARY LYMPH NODE BIOPSY Right 10/16/2019  ? Procedure: RIGHT BREAST LUMPECTOMY WITH SENTINEL LYMPH NODE BIOPSY;  Surgeon: Stark Klein, MD;  Location: [facility];  Service: General;  Laterality: Right;  ? COLONOSCOPY    ? ESOPHAGEAL BANDING  10/27/2021  ? Procedure: ESOPHAGEAL BANDING;  Surgeon: Irene Shipper, MD;  Location: [facility];  Service: Endoscopy;;  ? ESOPHAGOGASTRODUODENOSCOPY (EGD) WITH PROPOFOL N/A 10/27/2021  ? Procedure: ESOPHAGOGASTRODUODENOSCOPY (EGD) WITH PROPOFOL;  Surgeon: Henrene Pastor,  Docia Chuck, MD;  Location: [facility];  Service: Endoscopy;  Laterality: N/A;  ? ESOPHAGOGASTRODUODENOSCOPY (EGD) WITH PROPOFOL N/A 01/04/2022  ? Procedure: ESOPHAGOGASTRODUODENOSCOPY (EGD) WITH PROPOFOL;  Surgeon: Irene Shipper, MD;  Location: [facility];  Service: Endoscopy;  Laterality: N/A;  ? HAMMER TOE SURGERY Left 2012  ? pins placed in great toe, 2nd,3rd,  4th toes, all pins removed except great toe  ? IR ANGIOGRAM SELECTIVE EACH ADDITIONAL VESSEL  12/16/2021  ? IR ANGIOGRAM SELECTIVE EACH ADDITIONAL VESSEL  12/16/2021  ? IR ANGIOGRAM SELECTIVE EACH ADDITIONAL VESSEL  12/22/2021  ? IR ANGIOGRAM VISCERAL SELECTIVE  12/16/2021  ? IR ANGIOGRAM VISCERAL SELECTIVE  12/22/2021  ? IR EMBO TUMOR ORGAN ISCHEMIA INFARCT INC GUIDE ROADMAPPING  12/22/2021  ? IR PARACENTESIS  11/13/2019  ? IR RADIOLOGIST EVAL & MGMT  08/16/2019  ? IR RADIOLOGIST EVAL & MGMT  02/06/2020  ? IR RADIOLOGIST EVAL & MGMT  06/26/2020  ? IR RADIOLOGIST EVAL & MGMT  12/31/2020  ? IR RADIOLOGIST EVAL & MGMT  07/21/2021  ? IR RADIOLOGIST EVAL & MGMT  10/08/2021  ? IR RADIOLOGIST EVAL & MGMT  01/13/2022  ? IR THORACENTESIS ASP PLEURAL SPACE W/IMG GUIDE  10/12/2021  ? IR THORACENTESIS ASP PLEURAL SPACE W/IMG GUIDE  10/26/2021  ? IR THORACENTESIS ASP PLEURAL SPACE W/IMG GUIDE  11/19/2021  ? IR THORACENTESIS ASP PLEURAL SPACE W/IMG GUIDE  12/22/2021  ? IR US GUIDE VASC ACCESS RIGHT  12/16/2021  ? IR US GUIDE VASC ACCESS RIGHT  12/22/2021  ? PELVIC LAPAROSCOPY Bilateral 1992  ? RADIOLOGY WITH ANESTHESIA N/A 09/16/2021  ? Procedure: CT MICROWAVE ABLATION;  Surgeon: Aletta Edouard, MD;  Location: [facility];  Service: Radiology;  Laterality: N/A;  ? UPPER GASTROINTESTINAL ENDOSCOPY  2002  ? had esophageal dilitation  ? ?Social History:  reports that she has been smoking cigarettes. She started smoking about 43 years ago. She has a 15.00 pack-year smoking history. She has never used smokeless tobacco. She reports that she does not currently use alcohol after a past usage of about 6.0 standard drinks per week. She reports that she does not use drugs. ? ?Allergies  ?Allergen Reactions  ? Hydromorphone Hcl Nausea And Vomiting  ?  Severe vomiting  ? Aspirin Nausea Only  ? ? ?Family History  ?Problem Relation Age of Onset  ? Liver cancer Mother   ? Lung cancer Mother   ? Lung cancer Father   ? Liver cancer Maternal Grandmother   ? Colon  cancer Neg Hx   ? Esophageal cancer Neg Hx   ? Stomach cancer Neg Hx   ? Rectal cancer Neg Hx   ? ? ?Prior to Admission medications   ?Medication Sig Start Date End Date Taking? Authorizing Provider  ?dicyclomine (BENTYL) 20 MG tablet Take 0.5 tablets (10 mg total) by mouth every 6 (six) hours as needed for spasms. Take one tablet every 6-8 hours as needed for abdominal cramping ?Patient taking differently: Take 20 mg by mouth daily as needed (abdominal cramping). 01/07/22  Yes Noralyn Pick, NP  ?eplerenone (INSPRA) 25 MG tablet Take 50-75 mg by mouth See admin instructions. Take 2 tablets (50 mg) by mouth every morning (with lasix) and 3 tablets (75 mg) every evening 11/18/21  Yes [provider]  ?furosemide (LASIX) 40 MG tablet Take 60 mg by mouth every morning.   Yes [provider]  ?loperamide (IMODIUM) 2 MG capsule Take 4 mg by mouth daily as needed for diarrhea or loose stools.  Yes [provider]  ?nicotine (NICODERM CQ - DOSED IN MG/24 HOURS) 21 mg/24hr patch Place 21 mg onto the skin daily as needed (smoking cessation on work days).   Yes [provider]  ?pantoprazole (PROTONIX) 40 MG tablet Take 1 tablet (40 mg total) by mouth daily. 12/01/21  Yes Noralyn Pick, NP  ?potassium chloride SA (KLOR-CON M) 20 MEQ tablet Take 1 tablet (20 mEq total) by mouth 2 (two) times daily. ?Patient taking differently: Take 20 mEq by mouth every evening. Crush and mix with applesauce or yogurt 12/28/21 02/21/22 Yes Noralyn Pick, NP  ?promethazine (PHENERGAN) 12.5 MG tablet Take 12.5 mg by mouth daily as needed for nausea or vomiting. 02/16/22  Yes [provider]  ?tamoxifen (NOLVADEX) 20 MG tablet TAKE 1 TABLET BY MOUTH EVERY DAY ?Patient taking differently: Take 20 mg by mouth every evening. 10/13/20  Yes Truitt Merle, MD  ?ondansetron (ZOFRAN-ODT) 4 MG disintegrating tablet Dissolve one tablet on the tongue every 8 hours as needed for nausea and  vomiting. ?Patient not taking: Reported on 02/21/2022 12/01/21   Noralyn Pick, NP  ?traZODone (DESYREL) 100 MG tablet Take 1 tablet (100 mg total) by mouth at bedtime as needed for sleep. ?Patie

## 2022-02-22 ENCOUNTER — Observation Stay (HOSPITAL_COMMUNITY): Payer: BC Managed Care – PPO

## 2022-02-22 ENCOUNTER — Inpatient Hospital Stay (HOSPITAL_COMMUNITY): Payer: BC Managed Care – PPO

## 2022-02-22 DIAGNOSIS — J90 Pleural effusion, not elsewhere classified: Secondary | ICD-10-CM

## 2022-02-22 DIAGNOSIS — C220 Liver cell carcinoma: Secondary | ICD-10-CM | POA: Diagnosis not present

## 2022-02-22 DIAGNOSIS — Z853 Personal history of malignant neoplasm of breast: Secondary | ICD-10-CM | POA: Diagnosis not present

## 2022-02-22 DIAGNOSIS — J910 Malignant pleural effusion: Secondary | ICD-10-CM | POA: Diagnosis not present

## 2022-02-22 LAB — BODY FLUID CELL COUNT WITH DIFFERENTIAL
Eos, Fluid: 0 %
Lymphs, Fluid: 10 %
Monocyte-Macrophage-Serous Fluid: 64 % (ref 50–90)
Neutrophil Count, Fluid: 26 % — ABNORMAL HIGH (ref 0–25)
Total Nucleated Cell Count, Fluid: 97 cu mm (ref 0–1000)

## 2022-02-22 LAB — COMPREHENSIVE METABOLIC PANEL
ALT: 21 U/L (ref 0–44)
AST: 43 U/L — ABNORMAL HIGH (ref 15–41)
Albumin: 2.4 g/dL — ABNORMAL LOW (ref 3.5–5.0)
Alkaline Phosphatase: 74 U/L (ref 38–126)
Anion gap: 4 — ABNORMAL LOW (ref 5–15)
BUN: 9 mg/dL (ref 6–20)
CO2: 26 mmol/L (ref 22–32)
Calcium: 7.5 mg/dL — ABNORMAL LOW (ref 8.9–10.3)
Chloride: 101 mmol/L (ref 98–111)
Creatinine, Ser: 0.39 mg/dL — ABNORMAL LOW (ref 0.44–1.00)
GFR, Estimated: >60 mL/min (ref >60)
Glucose, Bld: 102 mg/dL — ABNORMAL HIGH (ref 70–99)
Potassium: 3.7 mmol/L (ref 3.5–5.1)
Sodium: 131 mmol/L — ABNORMAL LOW (ref 135–145)
Total Bilirubin: 1.6 mg/dL — ABNORMAL HIGH (ref 0.3–1.2)
Total Protein: 5.6 g/dL — ABNORMAL LOW (ref 6.5–8.1)

## 2022-02-22 LAB — CBC
HCT: 33.2 % — ABNORMAL LOW (ref 36.0–46.0)
Hemoglobin: 10.4 g/dL — ABNORMAL LOW (ref 12.0–15.0)
MCH: 24.8 pg — ABNORMAL LOW (ref 26.0–34.0)
MCHC: 31.3 g/dL (ref 30.0–36.0)
MCV: 79.2 fL — ABNORMAL LOW (ref 80.0–100.0)
Platelets: 78 10*3/uL — ABNORMAL LOW (ref 150–400)
RBC: 4.19 MIL/uL (ref 3.87–5.11)
RDW: 22.3 % — ABNORMAL HIGH (ref 11.5–15.5)
WBC: 4.9 10*3/uL (ref 4.0–10.5)
nRBC: 0 % (ref 0.0–0.2)

## 2022-02-22 LAB — GLUCOSE, PLEURAL OR PERITONEAL FLUID: Glucose, Fluid: 129 mg/dL

## 2022-02-22 LAB — PROTEIN, PLEURAL OR PERITONEAL FLUID: Total protein, fluid: <3 g/dL

## 2022-02-22 LAB — LACTATE DEHYDROGENASE: LDH: 170 U/L (ref 98–192)

## 2022-02-22 MED ORDER — MELATONIN 3 MG PO TABS
3.0000 mg | ORAL_TABLET | Freq: Every day | ORAL | Status: DC
Start: 2022-02-23 — End: 2022-02-23
  Administered 2022-02-22: 3 mg via ORAL
  Filled 2022-02-22: qty 1

## 2022-02-22 MED ORDER — LIDOCAINE HCL 1 % IJ SOLN
INTRAMUSCULAR | Status: AC
  Administered 2022-02-22: 10 mL
  Filled 2022-02-22: qty 20

## 2022-02-22 NOTE — Procedures (Signed)
PROCEDURE SUMMARY: ? ?Successful image-guided left thoracentesis. ?Yielded 2 L of hazy yellow fluid. ?Pt tolerated procedure well. ?No immediate complications. ?EBL = trace  ? ?Specimen was sent for labs. ?CXR ordered. ? ?Please see imaging section of Epic for full dictation. ? ?Tera Mater PA-C ?02/22/2022 ?11:48 AM ? ? ? ?

## 2022-02-22 NOTE — Progress Notes (Signed)
?PROGRESS NOTE ? ? ? ?Theresa Rocha  ION:[id number] DOB: 06/08/1966 DOA: 02/21/2022 ?PCP: Seward Carol, MD  ? ?Brief Narrative: 56 year old female with history of breast cancer, COPD, cirrhosis due to hepatitis C, hepatocellular carcinoma, Raynaud's syndrome, portal hypertension and thrombocytopenia admitted with recurring left pleural effusion.  Patient had left thoracentesis done 02/19/2022.  She came into the ER 02/21/2022 with dyspnea on exertion orthopnea and PND.  She had 2 recent paracentesis 3 days prior to admission and 11 days prior to admission. ?Patient was discharged home on high-dose Lasix which did not help her breathing so she decided to come to the ER.  CT of the chest shows large left pleural effusion. ? ?Assessment & Plan: ?  ?Principal Problem: ?  Recurrent left pleural effusion ?Active Problems: ?  Thrombocytopenia ([place]) ?  Iron deficiency anemia due to chronic blood loss ?  Hypokalemia ?  Prolonged QT interval ?  Nicotine dependence ?  Hyponatremia ?  Mild protein malnutrition ([place]) ?  Hyperbilirubinemia ? ? 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   ?Recurrent left pleural effusion following left lobe ablation suspicious for iatrogenic hepatic hydrothorax with ascites preferentially flowing into the left pleural space after the left lobe ablation.  Cytology from thoracentesis was negative in December 2022.  PET scan negative for thoracic metastasis. ?IR consulted and appreciated. ?Patient is not on O2 prior to admission.  Currently she is on 2 L of oxygen saturating 96%. ?She reports severe dyspnea on exertion  walking to the restroom or just even moving around in bed. ?Check cytology to paracentesis today ?Repeat chest x-ray in a.m. ? ? ?#2 hyponatremia/hypokalemia replete and recheck labs in a.m. ? 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 ?#4 tobacco abuse continue to encourage cessation though she has cut down on  smoking ?Nicotine patch ? ?#5 hepatocellular carcinoma followed by Dr. Annamaria Boots.  She is not a transplant candidate due to history of breast cancer ? ?#6 right breast cancer stage II status post right lumpectomy and radiation therapy and anastrozole and tamoxifen. ? 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 Henrene Pastor ? ? ?Estimated body mass index is 18.1 kg/m? as calculated from the following: ?  Height as of this encounter: '5\' 3"'$  (1.6 m). ?  Weight as of this encounter: 46.4 kg. ? ?DVT prophylaxis: None  ?code Status: Full code ?Family Communication: None at bedside ?Disposition Plan:  Status is: inpatient ?The patient will require care spanning > 2 midnights and should be moved to inpatient because: New oxygen requirement ?  ?Consultants:  ?Interventional radiology ? ?Procedures: None ?Antimicrobials: None ?Subjective: ? ?Patient had 2 L of pleural fluid removed again today ?She just had 2 L removed 2 days prior to this admission ?She feels less chest pressure and she feels her breathing is better but not yet back to her baseline she is still dependent on 2 L of oxygen. ? ?Objective: ?Vitals:  ? 02/21/22 1905 02/21/22 2307 02/22/22 0309 02/22/22 2440  ?BP: 97/61 96/71 112/78 103/77  ?Pulse: 95 91 87 95  ?Resp: '20 17 17 17  '$ ?Temp: 98.5 ?F (36.9 ?C) 97.6 ?F (36.4 ?C) 98 ?F (36.7 ?C) 97.9 ?F (36.6 ?C)  ?TempSrc: Oral Oral Oral Oral  ?SpO2: 91% 96% 97% 96%  ?Weight:      ?Height:      ? ? ?Intake/Output Summary (Last 24 hours) at 02/22/2022 1127 ?Last data filed at 02/21/2022 1945 ?Gross per  24 hour  ?Intake 447.28 ml  ?Output --  ?Net 447.28 ml  ? ?Filed Weights  ? 02/21/22 1455  ?Weight: 46.4 kg  ? ? ?Examination: ? ?General exam: Appears in mild distress due to shortness of breath ?Respiratory system: Diminished breath sounds to the left compared to the right to auscultation. Respiratory effort normal. ?Cardiovascular system: S1 & S2 heard, RRR. No JVD, murmurs, rubs, gallops or clicks. No pedal  edema. ?Gastrointestinal system: Abdomen is nondistended, soft and nontender. No organomegaly or masses felt. Normal bowel sounds heard. ?Central nervous system: Alert and oriented. No focal neurological deficits. ?Extremities: Symmetric 5 x 5 power. ?Skin: No rashes, lesions or ulcers ?Psychiatry: Judgement and insight appear normal. Mood & affect appropriate.  ? ? ? ?Data Reviewed: I have personally reviewed following labs and imaging studies ? ?CBC: ?Recent Labs  ?Lab 02/21/22 ?1228 02/22/22 ?0439  ?WBC 6.3 4.9  ?NEUTROABS 4.9  --   ?HGB 11.0* 10.4*  ?HCT 33.9* 33.2*  ?MCV 77.6* 79.2*  ?PLT 93* 78*  ? ?Basic Metabolic Panel: ?Recent Labs  ?Lab 02/21/22 ?1055 02/22/22 ?0439  ?NA 127* 131*  ?K 2.5* 3.7  ?CL 89* 101  ?CO2 27 26  ?GLUCOSE 105* 102*  ?BUN 7 9  ?CREATININE 0.42* 0.39*  ?CALCIUM 7.7* 7.5*  ?MG 1.8  --   ? ?GFR: ?Estimated Creatinine Clearance: 58.9 mL/min (A) (by C-G formula based on SCr of 0.39 mg/dL (L)). ?Liver Function Tests: ?Recent Labs  ?Lab 02/21/22 ?1055 02/22/22 ?0439  ?AST 55* 43*  ?ALT 25 21  ?ALKPHOS 96 74  ?BILITOT 1.6* 1.6*  ?PROT 7.0 5.6*  ?ALBUMIN 3.0* 2.4*  ? ?No results for input(s): LIPASE, AMYLASE in the last 168 hours. ?No results for input(s): AMMONIA in the last 168 hours. ?Coagulation Profile: ?Recent Labs  ?Lab 02/21/22 ?1055  ?INR 1.6*  ? ?Cardiac Enzymes: ?No results for input(s): CKTOTAL, CKMB, CKMBINDEX, TROPONINI in the last 168 hours. ?BNP (last 3 results) ?No results for input(s): PROBNP in the last 8760 hours. ?HbA1C: ?No results for input(s): HGBA1C in the last 72 hours. ?CBG: ?No results for input(s): GLUCAP in the last 168 hours. ?Lipid Profile: ?No results for input(s): CHOL, HDL, LDLCALC, TRIG, CHOLHDL, LDLDIRECT in the last 72 hours. ?Thyroid Function Tests: ?No results for input(s): TSH, T4TOTAL, FREET4, T3FREE, THYROIDAB in the last 72 hours. ?Anemia Panel: ?No results for input(s): VITAMINB12, FOLATE, FERRITIN, TIBC, IRON, RETICCTPCT in the last 72  hours. ?Sepsis Labs: ?No results for input(s): PROCALCITON, LATICACIDVEN in the last 168 hours. ? ?Recent Results (from the past 240 hour(s))  ?Resp Panel by RT-PCR (Flu A&B, Covid) Nasopharyngeal Swab     Status: None  ? Collection Time: 02/21/22 11:10 AM  ? Specimen: Nasopharyngeal Swab; Nasopharyngeal(NP) swabs in vial transport medium  ?Result Value Ref Range Status  ? SARS Coronavirus 2 by RT PCR NEGATIVE NEGATIVE Final  ?  Comment: (NOTE) ?SARS-CoV-2 target nucleic acids are NOT DETECTED. ? ?The SARS-CoV-2 RNA is generally detectable in upper respiratory ?specimens during the acute phase of infection. The lowest ?concentration of SARS-CoV-2 viral copies this assay can detect is ?138 copies/mL. A negative result does not preclude SARS-Cov-2 ?infection and should not be used as the sole basis for treatment or ?other patient management decisions. A negative result may occur with  ?improper specimen collection/handling, submission of specimen other ?than nasopharyngeal swab, presence of viral mutation(s) within the ?areas targeted by this assay, and inadequate number of viral ?copies(<138 copies/mL). A negative result must be combined  with ?clinical observations, patient history, and epidemiological ?information. The expected result is Negative. ? ?Fact Sheet for Patients:  ?EntrepreneurPulse.com.au ? ?Fact Sheet for Healthcare Providers:  ?IncredibleEmployment.be ? ?This test is no t yet approved or cleared by the [place] FDA and  ?has been authorized for detection and/or diagnosis of SARS-CoV-2 by ?FDA under an Emergency Use Authorization (EUA). This EUA will remain  ?in effect (meaning this test can be used) for the duration of the ?COVID-19 declaration under Section 564(b)(1) of the Act, 21 ?U.S.C.section 360bbb-3(b)(1), unless the authorization is terminated  ?or revoked sooner.  ? ? ?  ? Influenza A by PCR NEGATIVE NEGATIVE Final  ? Influenza B by PCR NEGATIVE  NEGATIVE Final  ?  Comment: (NOTE) ?The Xpert Xpress SARS-CoV-2/FLU/RSV plus assay is intended as an aid ?in the diagnosis of influenza from Nasopharyngeal swab specimens and ?should not be used as a sole basis for treatment. Na

## 2022-02-23 DIAGNOSIS — J90 Pleural effusion, not elsewhere classified: Secondary | ICD-10-CM | POA: Diagnosis not present

## 2022-02-23 LAB — COMPREHENSIVE METABOLIC PANEL
ALT: 22 U/L (ref 0–44)
AST: 50 U/L — ABNORMAL HIGH (ref 15–41)
Albumin: 2.6 g/dL — ABNORMAL LOW (ref 3.5–5.0)
Alkaline Phosphatase: 78 U/L (ref 38–126)
Anion gap: 5 (ref 5–15)
BUN: 12 mg/dL (ref 6–20)
CO2: 24 mmol/L (ref 22–32)
Calcium: 7.5 mg/dL — ABNORMAL LOW (ref 8.9–10.3)
Chloride: 99 mmol/L (ref 98–111)
Creatinine, Ser: 0.6 mg/dL (ref 0.44–1.00)
GFR, Estimated: >60 mL/min (ref >60)
Glucose, Bld: 159 mg/dL — ABNORMAL HIGH (ref 70–99)
Potassium: 3.5 mmol/L (ref 3.5–5.1)
Sodium: 128 mmol/L — ABNORMAL LOW (ref 135–145)
Total Bilirubin: 1.5 mg/dL — ABNORMAL HIGH (ref 0.3–1.2)
Total Protein: 6.3 g/dL — ABNORMAL LOW (ref 6.5–8.1)

## 2022-02-23 LAB — CBC
HCT: 36.6 % (ref 36.0–46.0)
Hemoglobin: 11.3 g/dL — ABNORMAL LOW (ref 12.0–15.0)
MCH: 24.7 pg — ABNORMAL LOW (ref 26.0–34.0)
MCHC: 30.9 g/dL (ref 30.0–36.0)
MCV: 80.1 fL (ref 80.0–100.0)
Platelets: 96 10*3/uL — ABNORMAL LOW (ref 150–400)
RBC: 4.57 MIL/uL (ref 3.87–5.11)
RDW: 22.2 % — ABNORMAL HIGH (ref 11.5–15.5)
WBC: 6.7 10*3/uL (ref 4.0–10.5)
nRBC: 0 % (ref 0.0–0.2)

## 2022-02-23 LAB — MAGNESIUM: Magnesium: 1.9 mg/dL (ref 1.7–2.4)

## 2022-02-23 LAB — CYTOLOGY - NON PAP

## 2022-02-23 MED ORDER — FUROSEMIDE 40 MG PO TABS: 40.0000 mg | ORAL_TABLET | Freq: Every day | ORAL | 2 refills | Status: DC

## 2022-02-23 MED ORDER — LIDOCAINE 5 % EX PTCH: 2.0000 | MEDICATED_PATCH | CUTANEOUS | 0 refills | Status: DC

## 2022-02-23 MED ORDER — POTASSIUM CHLORIDE CRYS ER 20 MEQ PO TBCR: 20.0000 meq | EXTENDED_RELEASE_TABLET | Freq: Every day | ORAL | 2 refills | Status: DC

## 2022-02-23 NOTE — Discharge Summary (Signed)
Physician Discharge Summary  ?Theresa Rocha MPN:[id number] DOB: 12/16/1965 DOA: 02/21/2022 ? ?PCP: Seward Carol, MD ? ?Admit date: 02/21/2022 ?Discharge date: 02/23/2022 ? ?Admitted From: home ?Disposition:  home ? ?Recommendations for Outpatient Follow-up:  ?Follow up with PCP in 1-2 weeks ?Please obtain BMP/CBC in one week ?Please follow up on the following pending results: Cytology of pleural fluid ?Follow-up with Dr. Kathlene Cote ?Follow-up with [name] ? ?Home Health: None ?Equipment/Devices: None ?Discharge Condition: Stable ?CODE STATUS: Full code ?Diet recommendation: Cardiac ?Brief/Interim Summary: ? ?56 year old female with history of breast cancer, COPD, cirrhosis due to hepatitis C, hepatocellular carcinoma, Raynaud's syndrome, portal hypertension and thrombocytopenia admitted with recurring left pleural effusion.  Patient had left thoracentesis done 02/19/2022.  She came into the ER 02/21/2022 with dyspnea on exertion orthopnea and PND.  She had 2 recent paracentesis 3 days prior to admission and 11 days prior to admission. ?Patient was discharged home on high-dose Lasix which did not help her breathing so she decided to come to the ER.  CT of the chest shows large left pleural effusion. ?Discharge Diagnoses:  ?Principal Problem: ?  Recurrent left pleural effusion ?Active Problems: ?  Thrombocytopenia ([place]) ?  Iron deficiency anemia due to chronic blood loss ?  Hypokalemia ?  Prolonged QT interval ?  Nicotine dependence ?  Hyponatremia ?  Mild protein malnutrition ([place]) ?  Hyperbilirubinemia ? ? ?  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   ?Recurrent left pleural effusion following left lobe ablation suspicious for iatrogenic hepatic hydrothorax with ascites preferentially flowing into the left pleural space after the left lobe ablation.  Cytology from thoracentesis was negative in December 2022.  PET scan negative for thoracic metastasis. ?IR was consulted and had 2  L hazy yellow fluid drained. ?Follow-up chest x-ray no evidence of pneumothorax. ?Patient was on room air and saturating about 95% at the time of discharge.  She is aware she needs to follow-up with hepatologist oncologist and interventional radiologist. ?  ?#2 hyponatremia/hypokalemia sodium was 128 on discharge and 127 on admission this was likely due to decreased p.o. intake plus or minus Lasix.  She is aware she needs to recheck her BMP in 1 week.  Potassium at the time of admission was 2.5 and 3.5 at the time of discharge.   ? 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 ?  ?#4 tobacco abuse continue to encourage cessation though she has cut down on smoking ?Nicotine patch ?  ?#5 hepatocellular carcinoma followed by Dr. Annamaria Boots.  She is not a transplant candidate due to history of breast cancer ?  ?#6 right breast cancer stage II status post right lumpectomy and radiation therapy and anastrozole and tamoxifen. ?  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 Henrene Pastor ? ? ?Estimated body mass index is 18.1 kg/m? as calculated from the following: ?  Height as of this encounter: '5\' 3"'$  (1.6 m). ?  Weight as of this encounter: 46.4 kg. ? ?Discharge Instructions ? ?Discharge Instructions   ? ? Diet - low sodium heart healthy   Complete by: As directed ?  ? Increase activity slowly   Complete by: As directed ?  ? ?  ? ?Allergies as of 02/23/2022   ? ?   Reactions  ? Hydromorphone Hcl Nausea And Vomiting  ? Severe vomiting  ? Aspirin Nausea Only  ? ?  ? ?  ?Medication List  ?  ? ?STOP  taking these medications   ? ?ondansetron 4 MG disintegrating tablet ?Commonly known as: ZOFRAN-ODT ?  ?traZODone 100 MG tablet ?Commonly known as: DESYREL ?  ? ?  ? ?TAKE these medications   ? ?dicyclomine 20 MG tablet ?Commonly known as: BENTYL ?Take 0.5 tablets (10 mg total) by mouth every 6 (six) hours as needed for spasms. Take one tablet every 6-8 hours as needed for abdominal  cramping ?What changed:  ?how much to take ?when to take this ?reasons to take this ?additional instructions ?  ?eplerenone 25 MG tablet ?Commonly known as: INSPRA ?Take 50-75 mg by mouth See admin instructions. Take 2 tablets (50 mg) by mouth every morning (with lasix) and 3 tablets (75 mg) every evening ?  ?furosemide 40 MG tablet ?Commonly known as: LASIX ?Take 1 tablet (40 mg total) by mouth daily. ?What changed:  ?how much to take ?when to take this ?  ?lidocaine 5 % ?Commonly known as: LIDODERM ?Place 2 patches onto the skin daily. Remove & Discard patch within 12 hours or as directed by MD ?  ?loperamide 2 MG capsule ?Commonly known as: IMODIUM ?Take 4 mg by mouth daily as needed for diarrhea or loose stools. ?  ?nicotine 21 mg/24hr patch ?Commonly known as: NICODERM CQ - dosed in mg/24 hours ?Place 21 mg onto the skin daily as needed (smoking cessation on work days). ?  ?pantoprazole 40 MG tablet ?Commonly known as: PROTONIX ?Take 1 tablet (40 mg total) by mouth daily. ?  ?potassium chloride SA 20 MEQ tablet ?Commonly known as: KLOR-CON M ?Take 1 tablet (20 mEq total) by mouth daily. ?What changed: when to take this ?  ?promethazine 12.5 MG tablet ?Commonly known as: PHENERGAN ?Take 12.5 mg by mouth daily as needed for nausea or vomiting. ?  ?tamoxifen 20 MG tablet ?Commonly known as: NOLVADEX ?TAKE 1 TABLET BY MOUTH EVERY DAY ?What changed: when to take this ?  ? ?  ? ? Follow-up Information   ? ? Seward Carol, MD Follow up.   ?Specialty: Internal Medicine ?Contact information: ?[address] ?[place] [address] ?[phone] ? ? ?  ?  ? ? Truitt Merle, MD Follow up.   ?Specialties: Hematology, Oncology ?Contact information: ?[address] ?[place] [address] ?[phone] ? ? ?  ?  ? ? Aletta Edouard, MD Follow up.   ?Specialties: Interventional Radiology, Radiology ?Contact information: ?[address] ?[place] [address] ?[phone] ? ? ?  ?  ? ?  ?  ? ?   ? ?Allergies  ?Allergen Reactions  ? Hydromorphone Hcl Nausea And Vomiting  ?  Severe vomiting  ? Aspirin Nausea Only  ? ? ?Consultations: ?Interventional radiology ? ? ?Procedures/Studies: ?DG Chest 1 View ? ?Result Date: 02/22/2022 ?CLINICAL DATA:  Left-sided thoracentesis. EXAM: CHEST  1 VIEW COMPARISON:  Earlier today at 11:53 a.m. FINDINGS: 3:22 p.m. Midline trachea. Normal heart size. Moderate left pleural effusion is similar. No pneumothorax. Clear right lung. Left base airspace disease is slightly improved. IMPRESSION: Slightly improved left base aeration. Similar moderate left pleural effusion without pneumothorax. Electronically Signed   By: Abigail Miyamoto M.D.   On: 02/22/2022 15:36  ? ?DG Chest 1 View ? ?Result Date: 02/22/2022 ?CLINICAL DATA:  Post left thoracentesis EXAM: CHEST  1 VIEW COMPARISON:  02/21/2022 FINDINGS: There is interval decrease in the left pleural effusion after thoracentesis. There is residual moderate sized left pleural effusion. There is no demonstrable pneumothorax. Possibility of underlying infiltrates  in the left lower lung fields is not excluded. IMPRESSION: There is interval decrease in the left pleural effusion. There is no pneumothorax. Electronically Signed   By: Elmer Picker M.D.   On: 02/22/2022 12:03  ? ?DG Chest 1 View ? ?Result Date: 02/19/2022 ?CLINICAL DATA:  56 year old female with a history of left-sided thoracentesis EXAM: CHEST  1 VIEW COMPARISON:  02/10/2022 FINDINGS: Cardiomediastinal silhouette unchanged in size and contour. Opacity at the left lung base, similar to the comparison chest x-ray. Obscuration of the left hemidiaphragm and the left heart border. No pneumothorax. Right lung well aerated. Surgical changes of the right chest. No interlobular septal thickening or central vascular congestion. IMPRESSION: No complicating features status post thoracentesis, with persisting opacity at the left lung base, likely a combination of residual fluid and  atelectasis/consolidation Electronically Signed   By: Corrie Mckusick D.O.   On: 02/19/2022 14:20  ? ?DG Chest 1 View ? ?Result Date: 02/10/2022 ?CLINICAL DATA:  Status post left thoracentesis. EXAM: CHEST  1 VI

## 2022-02-24 ENCOUNTER — Telehealth: Payer: Self-pay

## 2022-02-24 NOTE — Telephone Encounter (Signed)
Pt called wanting to know the results of the fluid removed from her thoracentesis.  Pt is asking for [name] to contact her regarding the results.  Informed pt as of today, this RN does not see where the cytology report has resulted but will make [name] aware of the pt's call. ?

## 2022-02-26 ENCOUNTER — Other Ambulatory Visit (HOSPITAL_COMMUNITY): Payer: Self-pay | Admitting: Interventional Radiology

## 2022-02-26 ENCOUNTER — Ambulatory Visit (HOSPITAL_COMMUNITY)
Admission: RE | Admit: 2022-02-26 | Discharge: 2022-02-26 | Disposition: A | Discharge status: Home / Self Care | Payer: BC Managed Care – PPO | Source: Ambulatory Visit | Attending: Radiology | Admitting: Radiology

## 2022-02-26 ENCOUNTER — Ambulatory Visit (HOSPITAL_COMMUNITY)
Admission: RE | Admit: 2022-02-26 | Discharge: 2022-02-26 | Disposition: A | Discharge status: Home / Self Care | Payer: BC Managed Care – PPO | Source: Ambulatory Visit | Attending: Interventional Radiology | Admitting: Interventional Radiology

## 2022-02-26 DIAGNOSIS — R918 Other nonspecific abnormal finding of lung field: Secondary | ICD-10-CM | POA: Diagnosis not present

## 2022-02-26 DIAGNOSIS — R0602 Shortness of breath: Secondary | ICD-10-CM

## 2022-02-26 DIAGNOSIS — C220 Liver cell carcinoma: Secondary | ICD-10-CM | POA: Diagnosis not present

## 2022-02-26 DIAGNOSIS — J90 Pleural effusion, not elsewhere classified: Secondary | ICD-10-CM | POA: Diagnosis not present

## 2022-02-26 DIAGNOSIS — R188 Other ascites: Secondary | ICD-10-CM | POA: Diagnosis not present

## 2022-02-26 DIAGNOSIS — K760 Fatty (change of) liver, not elsewhere classified: Secondary | ICD-10-CM | POA: Diagnosis not present

## 2022-02-26 DIAGNOSIS — R160 Hepatomegaly, not elsewhere classified: Secondary | ICD-10-CM | POA: Diagnosis not present

## 2022-02-26 MED ORDER — LIDOCAINE HCL 1 % IJ SOLN
INTRAMUSCULAR | Status: AC
  Administered 2022-02-26: 15 mL
  Filled 2022-02-26: qty 20

## 2022-02-26 MED ORDER — GADOBUTROL 1 MMOL/ML IV SOLN
5.0000 mL | Freq: Once | INTRAVENOUS | Status: AC | PRN
  Administered 2022-02-26: 5 mL via INTRAVENOUS

## 2022-02-26 NOTE — Procedures (Signed)
Ultrasound-guided therapeutic left thoracentesis performed yielding 1.8 liters of amber colored fluid. No immediate complications. Follow-up chest x-ray pending. EBL< 2 cc.     ? ?

## 2022-03-01 ENCOUNTER — Telehealth: Payer: Self-pay | Admitting: Internal Medicine

## 2022-03-01 ENCOUNTER — Ambulatory Visit (HOSPITAL_COMMUNITY)
Admission: RE | Admit: 2022-03-01 | Discharge: 2022-03-01 | Disposition: A | Discharge status: Home / Self Care | Payer: BC Managed Care – PPO | Source: Ambulatory Visit | Attending: Interventional Radiology | Admitting: Interventional Radiology

## 2022-03-01 ENCOUNTER — Ambulatory Visit (HOSPITAL_COMMUNITY)
Admission: RE | Admit: 2022-03-01 | Discharge: 2022-03-01 | Disposition: A | Discharge status: Home / Self Care | Payer: BC Managed Care – PPO | Source: Ambulatory Visit | Attending: Internal Medicine | Admitting: Internal Medicine

## 2022-03-01 ENCOUNTER — Other Ambulatory Visit (HOSPITAL_COMMUNITY): Payer: Self-pay | Admitting: Interventional Radiology

## 2022-03-01 DIAGNOSIS — R0602 Shortness of breath: Secondary | ICD-10-CM

## 2022-03-01 DIAGNOSIS — C220 Liver cell carcinoma: Secondary | ICD-10-CM | POA: Diagnosis not present

## 2022-03-01 DIAGNOSIS — J90 Pleural effusion, not elsewhere classified: Secondary | ICD-10-CM | POA: Diagnosis not present

## 2022-03-01 DIAGNOSIS — R918 Other nonspecific abnormal finding of lung field: Secondary | ICD-10-CM | POA: Diagnosis not present

## 2022-03-01 MED ORDER — LIDOCAINE HCL 1 % IJ SOLN
INTRAMUSCULAR | Status: AC
  Filled 2022-03-01: qty 20

## 2022-03-01 NOTE — Progress Notes (Signed)
Pt coming in today for repeat thoracentesis.  ?

## 2022-03-01 NOTE — Procedures (Signed)
PROCEDURE SUMMARY: ? ?Successful US guided therapeutic left thoracentesis. ?Yielded 1.7 L of clear, yellow fluid. ?Pt tolerated procedure well. ?No immediate complications. ? ?Specimen not sent for labs. ?CXR ordered. ? ?EBL < 1 mL ? ?Tyson Alias, AGNP ?03/01/2022 ?3:09 PM ? ?  ? ?

## 2022-03-03 ENCOUNTER — Other Ambulatory Visit (HOSPITAL_COMMUNITY): Payer: Self-pay | Admitting: Nurse Practitioner

## 2022-03-03 ENCOUNTER — Other Ambulatory Visit: Payer: Self-pay | Admitting: Nurse Practitioner

## 2022-03-03 DIAGNOSIS — R188 Other ascites: Secondary | ICD-10-CM

## 2022-03-04 ENCOUNTER — Other Ambulatory Visit (HOSPITAL_COMMUNITY): Payer: Self-pay | Admitting: Interventional Radiology

## 2022-03-04 ENCOUNTER — Other Ambulatory Visit (HOSPITAL_COMMUNITY): Payer: Self-pay | Admitting: Nurse Practitioner

## 2022-03-04 ENCOUNTER — Ambulatory Visit (HOSPITAL_COMMUNITY)
Admission: RE | Admit: 2022-03-04 | Discharge: 2022-03-04 | Disposition: A | Discharge status: Home / Self Care | Payer: BC Managed Care – PPO | Source: Ambulatory Visit | Attending: Nurse Practitioner | Admitting: Nurse Practitioner

## 2022-03-04 DIAGNOSIS — R188 Other ascites: Secondary | ICD-10-CM | POA: Diagnosis not present

## 2022-03-04 DIAGNOSIS — Z8505 Personal history of malignant neoplasm of liver: Secondary | ICD-10-CM | POA: Diagnosis not present

## 2022-03-04 HISTORY — PX: IR PARACENTESIS: IMG2679

## 2022-03-04 LAB — BODY FLUID CELL COUNT WITH DIFFERENTIAL
Eos, Fluid: 0 %
Lymphs, Fluid: 10 %
Monocyte-Macrophage-Serous Fluid: 45 % — ABNORMAL LOW (ref 50–90)
Neutrophil Count, Fluid: 45 % — ABNORMAL HIGH (ref 0–25)
Total Nucleated Cell Count, Fluid: 258 cu mm (ref 0–1000)

## 2022-03-04 LAB — GRAM STAIN

## 2022-03-04 LAB — ALBUMIN, PLEURAL OR PERITONEAL FLUID: Albumin, Fluid: <1.5 g/dL

## 2022-03-04 LAB — PROTEIN, PLEURAL OR PERITONEAL FLUID: Total protein, fluid: <3 g/dL

## 2022-03-04 MED ORDER — LIDOCAINE HCL 1 % IJ SOLN
INTRAMUSCULAR | Status: AC
Start: 2022-03-04 — End: 2022-03-04
  Administered 2022-03-04: 10 mL
  Filled 2022-03-04: qty 20

## 2022-03-04 NOTE — Procedures (Signed)
PROCEDURE SUMMARY: ? ?Successful US guided paracentesis from right lateral abdomen.  ?Yielded 700 mL of yellow, clear fluid.  ?No immediate complications.  ?Pt tolerated well.  ? ?Specimen was sent for labs. ? ?EBL < 64m ? ?KDocia BarrierPA-C ?03/04/2022 ?4:03 PM ? ? ? ?

## 2022-03-05 ENCOUNTER — Telehealth: Payer: Self-pay

## 2022-03-05 ENCOUNTER — Other Ambulatory Visit (HOSPITAL_COMMUNITY): Payer: Self-pay | Admitting: Interventional Radiology

## 2022-03-05 ENCOUNTER — Other Ambulatory Visit: Payer: Self-pay | Admitting: Interventional Radiology

## 2022-03-05 ENCOUNTER — Encounter: Payer: Self-pay | Admitting: *Deleted

## 2022-03-05 ENCOUNTER — Ambulatory Visit (HOSPITAL_COMMUNITY): Payer: BC Managed Care – PPO

## 2022-03-05 ENCOUNTER — Encounter (HOSPITAL_COMMUNITY): Payer: Self-pay

## 2022-03-05 ENCOUNTER — Ambulatory Visit
Admission: RE | Admit: 2022-03-05 | Discharge: 2022-03-05 | Disposition: A | Discharge status: Home / Self Care | Payer: BC Managed Care – PPO | Source: Ambulatory Visit | Attending: Interventional Radiology | Admitting: Interventional Radiology

## 2022-03-05 ENCOUNTER — Ambulatory Visit (HOSPITAL_COMMUNITY)
Admission: RE | Admit: 2022-03-05 | Discharge: 2022-03-05 | Disposition: A | Discharge status: Home / Self Care | Payer: BC Managed Care – PPO | Source: Ambulatory Visit | Attending: Radiology | Admitting: Radiology

## 2022-03-05 ENCOUNTER — Ambulatory Visit (HOSPITAL_COMMUNITY)
Admission: RE | Admit: 2022-03-05 | Discharge: 2022-03-05 | Disposition: A | Discharge status: Home / Self Care | Payer: BC Managed Care – PPO | Source: Ambulatory Visit | Attending: Interventional Radiology | Admitting: Interventional Radiology

## 2022-03-05 DIAGNOSIS — C220 Liver cell carcinoma: Secondary | ICD-10-CM | POA: Diagnosis not present

## 2022-03-05 DIAGNOSIS — Z9889 Other specified postprocedural states: Secondary | ICD-10-CM | POA: Diagnosis not present

## 2022-03-05 DIAGNOSIS — J90 Pleural effusion, not elsewhere classified: Secondary | ICD-10-CM | POA: Diagnosis not present

## 2022-03-05 DIAGNOSIS — J910 Malignant pleural effusion: Secondary | ICD-10-CM | POA: Diagnosis not present

## 2022-03-05 DIAGNOSIS — Z8505 Personal history of malignant neoplasm of liver: Secondary | ICD-10-CM | POA: Diagnosis not present

## 2022-03-05 HISTORY — PX: IR RADIOLOGIST EVAL & MGMT: IMG5224

## 2022-03-05 LAB — CYTOLOGY - NON PAP

## 2022-03-05 MED ORDER — LIDOCAINE HCL 1 % IJ SOLN
INTRAMUSCULAR | Status: AC
  Administered 2022-03-05: 15 mL
  Filled 2022-03-05: qty 20

## 2022-03-05 NOTE — Telephone Encounter (Signed)
Spoke with [facility] [phone] regarding Pleurx Drainage Kits and they stated the kits are $71 each and they DO NOT bill against the pt's insurance.  Contacted [place] Social Worker at [facility] to see if she knows of a DME supplier that will bill against the pt's insurance for the drainage kits.  Awaiting Susan's response to my voicemail.  In the meantime this RN will continue to look for a supplier that will bill the pt's insurance. ?

## 2022-03-05 NOTE — Progress Notes (Signed)
? ? ?Chief Complaint: ?Patient was seen in consultation today for follow-up for hepatocellular carcinoma and recurrent left pleural effusion. ? ?History of Present Illness: ?Theresa Rocha is a 56 y.o. female with a history of chronic hepatitis C and cirrhosis who is status post microwave thermal ablation of a 1.8 cm left lobe hepatocellular carcinoma on 09/16/2021.  She is now also status post yttrium-90 radioembolization to the right lobe of the liver on 12/22/2021 with delivery of a 25 mCi dose of Y-90 SIR Spheres via the right hepatic artery to treat worsening multifocal hepatocellular carcinoma in the right lobe of the liver.  ? ?She has had a recurrent left pleural effusion after left lobe thermal ablation which had been decreasing in need for frequency of thoracentesis and volume of fluid removed in February.  However, more recently she has required much more frequent thoracentesis procedures of large-volume and a single paracentesis procedure after being hospitalized beginning on 02/21/2022.  At that time diuretics were held during admission due to hyponatremia and hypokalemia.  She was discharged from the hospital on 02/23/2022 and diuretic treatment has been gradually added back and recently increased after she has followed up with [facility]. She has required left thoracentesis on 3/27, 3/31, 4/3 and 4/7.  She had a paracentesis procedure yesterday yielding 700 mL of fluid.  A follow-up MRI of the abdomen was performed on 02/26/2022. ? ?Theresa Rocha has taken off some time from work due to chronic dyspnea and weakness. ? ?Past Medical History:  ?Diagnosis Date  ? Anemia   ? Breast cancer ([place]) 08/30/2019  ? Cancer [facility])   ? liver- just diagnosed, not started treatment yet  ? Cervicalgia   ? COPD (chronic obstructive pulmonary disease) ([place])   ? Esophageal varices (HCC)   ? GERD (gastroesophageal reflux disease)   ? Headache   ? Hepatic cirrhosis ([place])   ? Hepatitis C   ? resolved 2018  ? Hepatocellular carcinoma  ([place])   ? IBS (irritable bowel syndrome)   ? Lactose intolerance   ? Personal history of radiation therapy 11/2019  ? Right breast  ? Portal hypertension ([place])   ? Raynaud's syndrome   ? Thrombocytopenia ([place])   ? ? ?Past Surgical History:  ?Procedure Laterality Date  ? BREAST BIOPSY Right 09/2019  ? BREAST LUMPECTOMY Right 09/2019  ? BREAST LUMPECTOMY WITH AXILLARY LYMPH NODE BIOPSY Right 10/16/2019  ? Procedure: RIGHT BREAST LUMPECTOMY WITH SENTINEL LYMPH NODE BIOPSY;  Surgeon: Stark Klein, MD;  Location: [facility];  Service: General;  Laterality: Right;  ? COLONOSCOPY    ? ESOPHAGEAL BANDING  10/27/2021  ? Procedure: ESOPHAGEAL BANDING;  Surgeon: Irene Shipper, MD;  Location: [facility];  Service: Endoscopy;;  ? ESOPHAGOGASTRODUODENOSCOPY (EGD) WITH PROPOFOL N/A 10/27/2021  ? Procedure: ESOPHAGOGASTRODUODENOSCOPY (EGD) WITH PROPOFOL;  Surgeon: Irene Shipper, MD;  [facility];  Service: Endoscopy;  Laterality: N/A;  ? ESOPHAGOGASTRODUODENOSCOPY (EGD) WITH PROPOFOL N/A 01/04/2022  ? Procedure: ESOPHAGOGASTRODUODENOSCOPY (EGD) WITH PROPOFOL;  Surgeon: Irene Shipper, MD;  [facility];  Service: Endoscopy;  Laterality: N/A;  ? HAMMER TOE SURGERY Left 2012  ? pins placed in great toe, 2nd,3rd, 4th toes, all pins removed except great toe  ? IR ANGIOGRAM SELECTIVE EACH ADDITIONAL VESSEL  12/16/2021  ? IR ANGIOGRAM SELECTIVE EACH ADDITIONAL VESSEL  12/16/2021  ? IR ANGIOGRAM SELECTIVE EACH ADDITIONAL VESSEL  12/22/2021  ? IR ANGIOGRAM VISCERAL SELECTIVE  12/16/2021  ? IR ANGIOGRAM VISCERAL SELECTIVE  12/22/2021  ? IR EMBO  TUMOR ORGAN ISCHEMIA INFARCT INC GUIDE ROADMAPPING  12/22/2021  ? IR PARACENTESIS  11/13/2019  ? IR PARACENTESIS  03/04/2022  ? IR RADIOLOGIST EVAL & MGMT  08/16/2019  ? IR RADIOLOGIST EVAL & MGMT  02/06/2020  ? IR RADIOLOGIST EVAL & MGMT  06/26/2020  ? IR RADIOLOGIST EVAL & MGMT  12/31/2020  ? IR RADIOLOGIST EVAL & MGMT  07/21/2021  ? IR RADIOLOGIST EVAL & MGMT  10/08/2021  ? IR RADIOLOGIST EVAL &  MGMT  01/13/2022  ? IR RADIOLOGIST EVAL & MGMT  03/05/2022  ? IR THORACENTESIS ASP PLEURAL SPACE W/IMG GUIDE  10/12/2021  ? IR THORACENTESIS ASP PLEURAL SPACE W/IMG GUIDE  10/26/2021  ? IR THORACENTESIS ASP PLEURAL SPACE W/IMG GUIDE  11/19/2021  ? IR THORACENTESIS ASP PLEURAL SPACE W/IMG GUIDE  12/22/2021  ? IR US GUIDE VASC ACCESS RIGHT  12/16/2021  ? IR US GUIDE VASC ACCESS RIGHT  12/22/2021  ? PELVIC LAPAROSCOPY Bilateral 1992  ? RADIOLOGY WITH ANESTHESIA N/A 09/16/2021  ? Procedure: CT MICROWAVE ABLATION;  Surgeon: Aletta Edouard, MD;  [facility];  Service: Radiology;  Laterality: N/A;  ? UPPER GASTROINTESTINAL ENDOSCOPY  2002  ? had esophageal dilitation  ? ? ?Allergies: ?Hydromorphone hcl and Aspirin ? ?Medications: ?Prior to Admission medications   ?Medication Sig Start Date End Date Taking? Authorizing Provider  ?dicyclomine (BENTYL) 20 MG tablet Take 0.5 tablets (10 mg total) by mouth every 6 (six) hours as needed for spasms. Take one tablet every 6-8 hours as needed for abdominal cramping ?Patient taking differently: Take 20 mg by mouth daily as needed (abdominal cramping). 01/07/22   Noralyn Pick, NP  ?eplerenone (INSPRA) 25 MG tablet Take 50-75 mg by mouth See admin instructions. Take 2 tablets (50 mg) by mouth every morning (with lasix) and 3 tablets (75 mg) every evening 11/18/21   [provider]  ?furosemide (LASIX) 40 MG tablet Take 1 tablet (40 mg total) by mouth daily. 02/23/22   Georgette Shell, MD  ?lidocaine (LIDODERM) 5 % Place 2 patches onto the skin daily. Remove & Discard patch within 12 hours or as directed by MD 02/23/22   Georgette Shell, MD  ?loperamide (IMODIUM) 2 MG capsule Take 4 mg by mouth daily as needed for diarrhea or loose stools.    [provider]  ?nicotine (NICODERM CQ - DOSED IN MG/24 HOURS) 21 mg/24hr patch Place 21 mg onto the skin daily as needed (smoking cessation on work days).    [provider]  ?pantoprazole (PROTONIX)  40 MG tablet Take 1 tablet (40 mg total) by mouth daily. 12/01/21   Noralyn Pick, NP  ?potassium chloride SA (KLOR-CON M) 20 MEQ tablet Take 1 tablet (20 mEq total) by mouth daily. 02/23/22   Georgette Shell, MD  ?promethazine (PHENERGAN) 12.5 MG tablet Take 12.5 mg by mouth daily as needed for nausea or vomiting. 02/16/22   [provider]  ?tamoxifen (NOLVADEX) 20 MG tablet TAKE 1 TABLET BY MOUTH EVERY DAY ?Patient taking differently: Take 20 mg by mouth every evening. 10/13/20   Truitt Merle, MD  ?  ? ?Family History  ?Problem Relation Age of Onset  ? Liver cancer Mother   ? Lung cancer Mother   ? Lung cancer Father   ? Liver cancer Maternal Grandmother   ? Colon cancer Neg Hx   ? Esophageal cancer Neg Hx   ? Stomach cancer Neg Hx   ? Rectal cancer Neg Hx   ? ? ?Social  History  ? ?Socioeconomic History  ? Marital status: Married  ?  Spouse name: Not on file  ? Number of children: Not on file  ? Years of education: Not on file  ? Highest education level: Not on file  ?Occupational History  ? Not on file  ?Tobacco Use  ? Smoking status: Every Day  ?  Packs/day: 0.50  ?  Years: 30.00  ?  Pack years: 15.00  ?  Types: Cigarettes  ?  Start date: 11/29/1978  ? Smokeless tobacco: Never  ?Vaping Use  ? Vaping Use: Never used  ?Substance and Sexual Activity  ? Alcohol use: Not Currently  ?  Alcohol/week: 6.0 standard drinks  ?  Types: 6 Cans of beer per week  ?  Comment: stopped 06/2019  ? Drug use: No  ? Sexual activity: Not Currently  ?  Partners: Male  ?Other Topics Concern  ? Not on file  ?Social History Narrative  ? Not on file  ? ?Social Determinants of Health  ? ?Financial Resource Strain: Not on file  ?Food Insecurity: Not on file  ?Transportation Needs: Not on file  ?Physical Activity: Not on file  ?Stress: Not on file  ?Social Connections: Not on file  ? ? ?ECOG Status: ?2 - Symptomatic, <50% confined to bed ? ?Review of Systems: A 12 point ROS discussed and pertinent positives are indicated in  the HPI above.  All other systems are negative. ? ?Review of Systems  ?Constitutional:  Positive for activity change and fatigue.  ?Respiratory:  Positive for shortness of breath.   ?Cardiovascular: Neg

## 2022-03-05 NOTE — Telephone Encounter (Signed)
Notified Patient of completion of FMLA Paperwork. Fax transmission confirmation received. Copy placed at front desk for pick-up as requested by Patient. No other needs or concerns voiced at this time. ?

## 2022-03-05 NOTE — Procedures (Signed)
Ultrasound-guided therapeutic left thoracentesis performed yielding 1.7 liters of amber colored fluid. No immediate complications. Follow-up chest x-ray pending. EBL < 1 cc.     ? ?

## 2022-03-08 ENCOUNTER — Other Ambulatory Visit (HOSPITAL_COMMUNITY): Payer: Self-pay | Admitting: Physician Assistant

## 2022-03-08 ENCOUNTER — Other Ambulatory Visit: Payer: Self-pay | Admitting: Internal Medicine

## 2022-03-08 DIAGNOSIS — E871 Hypo-osmolality and hyponatremia: Secondary | ICD-10-CM | POA: Diagnosis not present

## 2022-03-08 DIAGNOSIS — J90 Pleural effusion, not elsewhere classified: Secondary | ICD-10-CM | POA: Diagnosis not present

## 2022-03-08 DIAGNOSIS — E876 Hypokalemia: Secondary | ICD-10-CM | POA: Diagnosis not present

## 2022-03-08 DIAGNOSIS — C220 Liver cell carcinoma: Secondary | ICD-10-CM | POA: Diagnosis not present

## 2022-03-09 ENCOUNTER — Other Ambulatory Visit: Payer: Self-pay

## 2022-03-09 ENCOUNTER — Telehealth: Payer: Self-pay

## 2022-03-09 ENCOUNTER — Ambulatory Visit (HOSPITAL_COMMUNITY)
Admission: RE | Admit: 2022-03-09 | Discharge: 2022-03-09 | Disposition: A | Discharge status: Home / Self Care | Payer: BC Managed Care – PPO | Source: Ambulatory Visit | Attending: Interventional Radiology | Admitting: Interventional Radiology

## 2022-03-09 ENCOUNTER — Other Ambulatory Visit: Payer: Self-pay | Admitting: Interventional Radiology

## 2022-03-09 ENCOUNTER — Encounter (HOSPITAL_COMMUNITY): Payer: Self-pay

## 2022-03-09 ENCOUNTER — Other Ambulatory Visit: Payer: Self-pay | Admitting: Radiology

## 2022-03-09 DIAGNOSIS — C220 Liver cell carcinoma: Secondary | ICD-10-CM | POA: Diagnosis not present

## 2022-03-09 DIAGNOSIS — J90 Pleural effusion, not elsewhere classified: Secondary | ICD-10-CM | POA: Diagnosis not present

## 2022-03-09 DIAGNOSIS — K7460 Unspecified cirrhosis of liver: Secondary | ICD-10-CM | POA: Diagnosis not present

## 2022-03-09 DIAGNOSIS — F17210 Nicotine dependence, cigarettes, uncomplicated: Secondary | ICD-10-CM | POA: Insufficient documentation

## 2022-03-09 DIAGNOSIS — Z170 Estrogen receptor positive status [ER+]: Secondary | ICD-10-CM

## 2022-03-09 HISTORY — PX: IR PERC PLEURAL DRAIN W/INDWELL CATH W/IMG GUIDE: IMG5383

## 2022-03-09 LAB — CULTURE, BODY FLUID W GRAM STAIN -BOTTLE: Culture: NO GROWTH

## 2022-03-09 MED ORDER — MIDAZOLAM HCL 2 MG/2ML IJ SOLN
INTRAMUSCULAR | Status: AC
  Filled 2022-03-09: qty 2

## 2022-03-09 MED ORDER — SODIUM CHLORIDE 0.9 % IV SOLN: 8.0000 mg | INTRAVENOUS | Status: AC

## 2022-03-09 MED ORDER — FENTANYL CITRATE (PF) 100 MCG/2ML IJ SOLN
INTRAMUSCULAR | Status: AC | PRN
  Administered 2022-03-09: 50 ug via INTRAVENOUS
  Administered 2022-03-09 (×2): 25 ug via INTRAVENOUS

## 2022-03-09 MED ORDER — CEFAZOLIN SODIUM-DEXTROSE 2-4 GM/100ML-% IV SOLN: 2.0000 g | Freq: Once | INTRAVENOUS | Status: AC

## 2022-03-09 MED ORDER — CEFAZOLIN SODIUM-DEXTROSE 1-4 GM/50ML-% IV SOLN: 1.0000 g | INTRAVENOUS | Status: DC

## 2022-03-09 MED ORDER — MIDAZOLAM HCL 2 MG/2ML IJ SOLN
INTRAMUSCULAR | Status: AC | PRN
  Administered 2022-03-09 (×2): 1 mg via INTRAVENOUS

## 2022-03-09 MED ORDER — CEFAZOLIN SODIUM-DEXTROSE 2-4 GM/100ML-% IV SOLN
INTRAVENOUS | Status: AC
  Administered 2022-03-09: 2 g via INTRAVENOUS
  Filled 2022-03-09: qty 100

## 2022-03-09 MED ORDER — PANTOPRAZOLE SODIUM 40 MG IV SOLR: 40.0000 mg | INTRAVENOUS | Status: AC

## 2022-03-09 MED ORDER — SODIUM CHLORIDE 0.9 % IV SOLN: INTRAVENOUS | Status: DC

## 2022-03-09 MED ORDER — FENTANYL CITRATE (PF) 100 MCG/2ML IJ SOLN
INTRAMUSCULAR | Status: AC
  Filled 2022-03-09: qty 2

## 2022-03-09 MED ORDER — LIDOCAINE-EPINEPHRINE 1 %-1:100000 IJ SOLN
INTRAMUSCULAR | Status: AC
  Filled 2022-03-09: qty 1

## 2022-03-09 MED ORDER — PIPERACILLIN-TAZOBACTAM 3.375 G IVPB 30 MIN: 3.3750 g | INTRAVENOUS | Status: AC

## 2022-03-09 NOTE — Progress Notes (Signed)
Placed referral order for [place] with [facility].  Spoke with Kirkland Hun for Theresa Rocha [phone] regarding referral for Theresa Rocha for Theresa Rocha teaching and management.  Theresa Rocha stated he will speak with his [facility] office [phone] regarding patient acceptance.  Theresa Rocha wants to confirm if the [place] office has staffing to service this patient.  Theresa Rocha stated he would contact Theresa Rocha office if whether they can accept this pt or not. ? ?Completed Theresa Rocha/Pleurx drainage Kit order form and will fax to Theresa Rocha once Theresa Rocha returns to the office on 03/10/2022.  Theresa Rocha team will send the pt home with enough drainage kits until Theresa Rocha ships supplies per Secure Chat between IR and Theresa Rocha staff.  ?

## 2022-03-09 NOTE — Sedation Documentation (Signed)
Patient is resting comfortably. 

## 2022-03-09 NOTE — H&P (Signed)
? ?Chief Complaint: ?Patient was seen in consultation today for left thorax pleurX catheter placement at the request of Dr Ky Barban ? ? ?Supervising Physician: Aletta Edouard ? ?Patient Status: [facility] - Out-pt ? ?History of Present Illness: ?Theresa Rocha is a 56 y.o. female  ? ?Dx Hepatocelluar cancer ?Cirrhosis ?Follows with Dr Henrene Pastor; Dr Burr Medico and Dr Kathlene Cote ?Worsening SOB -- requiring more frequent Thoracentesis ? ?Dr Kathlene Cote note 03/05/22: ?Chronic hepatitis C and cirrhosis who is status post microwave thermal ablation of a 1.8 cm left lobe hepatocellular carcinoma on 09/16/2021.  She is now also status post yttrium-90 radioembolization to the right lobe of the liver on 12/22/2021 with delivery of a 25 mCi dose of Y-90 SIR Spheres via the right hepatic artery to treat worsening multifocal hepatocellular carcinoma in the right lobe of the liver.  ?  ?She has had a recurrent left pleural effusion after left lobe thermal ablation which had been decreasing in need for frequency of thoracentesis and volume of fluid removed in February.  However, more recently she has required much more frequent thoracentesis procedures of large-volume and a single paracentesis procedure after being hospitalized beginning on 02/21/2022.  At that time diuretics were held during admission due to hyponatremia and hypokalemia.  She was discharged from the hospital on 02/23/2022 and diuretic treatment has been gradually added back and recently increased after she has followed up with [facility]. She has required left thoracentesis on 3/27, 3/31, 4/3 and 4/7.  She had a paracentesis procedure yesterday yielding 700 mL of fluid.  A follow-up MRI of the abdomen was performed on 02/26/2022. ? ?I have suggested that we place a tunneled PleurX catheter in the left pleural space early next week for current management of the recurrent left pleural effusion.  This will allow drainage of the pleural effusion at home and the presence of the catheter may  also have some pleurodesis effect by its mechanical presence.  Evon is in agreement and would like to proceed with tunneled PleurX catheter placement.  The procedure is scheduled to be performed at [facility] on Tuesday, April 11. ? ? ?She is scheduled today for Left PleurX catheter placement in IR ?Arrangements are being made for supplies; training and HHRN per IR and [facility] ? ? ? ?Past Medical History:  ?Diagnosis Date  ? Anemia   ? Breast cancer ([place]) 08/30/2019  ? Cancer [facility]   ? liver- just diagnosed, not started treatment yet  ? Cervicalgia   ? COPD (chronic obstructive pulmonary disease) ([place])   ? Esophageal varices (HCC)   ? GERD (gastroesophageal reflux disease)   ? Headache   ? Hepatic cirrhosis ([place])   ? Hepatitis C   ? resolved 2018  ? Hepatocellular carcinoma ([place])   ? IBS (irritable bowel syndrome)   ? Lactose intolerance   ? Personal history of radiation therapy 11/2019  ? Right breast  ? Portal hypertension ([place])   ? Raynaud's syndrome   ? Thrombocytopenia ([place])   ? ? ?Past Surgical History:  ?Procedure Laterality Date  ? BREAST BIOPSY Right 09/2019  ? BREAST LUMPECTOMY Right 09/2019  ? BREAST LUMPECTOMY WITH AXILLARY LYMPH NODE BIOPSY Right 10/16/2019  ? Procedure: RIGHT BREAST LUMPECTOMY WITH SENTINEL LYMPH NODE BIOPSY;  Surgeon: Stark Klein, MD;  Location: [facility];  Service: General;  Laterality: Right;  ? COLONOSCOPY    ? ESOPHAGEAL BANDING  10/27/2021  ? Procedure: ESOPHAGEAL BANDING;  Surgeon: Irene Shipper, MD;  [facility];  Service: Endoscopy;;  ? ESOPHAGOGASTRODUODENOSCOPY (EGD) WITH PROPOFOL N/A 10/27/2021  ? Procedure: ESOPHAGOGASTRODUODENOSCOPY (EGD) WITH PROPOFOL;  Surgeon: Irene Shipper, MD;  [facility];  Service: Endoscopy;  Laterality: N/A;  ? ESOPHAGOGASTRODUODENOSCOPY (EGD) WITH PROPOFOL N/A 01/04/2022  ? Procedure: ESOPHAGOGASTRODUODENOSCOPY (EGD) WITH PROPOFOL;  Surgeon: Irene Shipper, MD;  [facility];  Service: Endoscopy;   Laterality: N/A;  ? HAMMER TOE SURGERY Left 2012  ? pins placed in great toe, 2nd,3rd, 4th toes, all pins removed except great toe  ? IR ANGIOGRAM SELECTIVE EACH ADDITIONAL VESSEL  12/16/2021  ? IR ANGIOGRAM SELECTIVE EACH ADDITIONAL VESSEL  12/16/2021  ? IR ANGIOGRAM SELECTIVE EACH ADDITIONAL VESSEL  12/22/2021  ? IR ANGIOGRAM VISCERAL SELECTIVE  12/16/2021  ? IR ANGIOGRAM VISCERAL SELECTIVE  12/22/2021  ? IR EMBO TUMOR ORGAN ISCHEMIA INFARCT INC GUIDE ROADMAPPING  12/22/2021  ? IR PARACENTESIS  11/13/2019  ? IR PARACENTESIS  03/04/2022  ? IR RADIOLOGIST EVAL & MGMT  08/16/2019  ? IR RADIOLOGIST EVAL & MGMT  02/06/2020  ? IR RADIOLOGIST EVAL & MGMT  06/26/2020  ? IR RADIOLOGIST EVAL & MGMT  12/31/2020  ? IR RADIOLOGIST EVAL & MGMT  07/21/2021  ? IR RADIOLOGIST EVAL & MGMT  10/08/2021  ? IR RADIOLOGIST EVAL & MGMT  01/13/2022  ? IR RADIOLOGIST EVAL & MGMT  03/05/2022  ? IR THORACENTESIS ASP PLEURAL SPACE W/IMG GUIDE  10/12/2021  ? IR THORACENTESIS ASP PLEURAL SPACE W/IMG GUIDE  10/26/2021  ? IR THORACENTESIS ASP PLEURAL SPACE W/IMG GUIDE  11/19/2021  ? IR THORACENTESIS ASP PLEURAL SPACE W/IMG GUIDE  12/22/2021  ? IR US GUIDE VASC ACCESS RIGHT  12/16/2021  ? IR US GUIDE VASC ACCESS RIGHT  12/22/2021  ? PELVIC LAPAROSCOPY Bilateral 1992  ? RADIOLOGY WITH ANESTHESIA N/A 09/16/2021  ? Procedure: CT MICROWAVE ABLATION;  Surgeon: Aletta Edouard, MD;  [facility];  Service: Radiology;  Laterality: N/A;  ? UPPER GASTROINTESTINAL ENDOSCOPY  2002  ? had esophageal dilitation  ? ? ?Allergies: ?Hydromorphone hcl and Aspirin ? ?Medications: ?Prior to Admission medications   ?Medication Sig Start Date End Date Taking? Authorizing Provider  ?dicyclomine (BENTYL) 20 MG tablet Take 0.5 tablets (10 mg total) by mouth every 6 (six) hours as needed for spasms. Take one tablet every 6-8 hours as needed for abdominal cramping ?Patient taking differently: Take 20 mg by mouth daily as needed (abdominal cramping). 01/07/22  Yes Noralyn Pick, NP   ?eplerenone (INSPRA) 25 MG tablet Take 50-75 mg by mouth See admin instructions. Take 2 tablets (50 mg) by mouth every morning (with lasix) and 3 tablets (75 mg) every evening 11/18/21  Yes [provider]  ?furosemide (LASIX) 40 MG tablet Take 1 tablet (40 mg total) by mouth daily. 02/23/22  Yes Georgette Shell, MD  ?lidocaine (LIDODERM) 5 % Place 2 patches onto the skin daily. Remove & Discard patch within 12 hours or as directed by MD 02/23/22  Yes Georgette Shell, MD  ?loperamide (IMODIUM) 2 MG capsule Take 4 mg by mouth daily as needed for diarrhea or loose stools.   Yes [provider]  ?nicotine (NICODERM CQ - DOSED IN MG/24 HOURS) 21 mg/24hr patch Place 21 mg onto the skin daily as needed (smoking cessation on work days).   Yes [provider]  ?pantoprazole (PROTONIX) 40 MG tablet Take 1 tablet (40 mg total) by mouth daily. 12/01/21  Yes Noralyn Pick, NP  ?potassium chloride SA (KLOR-CON M) 20 MEQ tablet Take  1 tablet (20 mEq total) by mouth daily. 02/23/22  Yes Georgette Shell, MD  ?promethazine (PHENERGAN) 12.5 MG tablet Take 12.5 mg by mouth daily as needed for nausea or vomiting. 02/16/22  Yes [provider]  ?tamoxifen (NOLVADEX) 20 MG tablet TAKE 1 TABLET BY MOUTH EVERY DAY ?Patient taking differently: Take 20 mg by mouth every evening. 10/13/20  Yes Truitt Merle, MD  ?  ? ?Family History  ?Problem Relation Age of Onset  ? Liver cancer Mother   ? Lung cancer Mother   ? Lung cancer Father   ? Liver cancer Maternal Grandmother   ? Colon cancer Neg Hx   ? Esophageal cancer Neg Hx   ? Stomach cancer Neg Hx   ? Rectal cancer Neg Hx   ? ? ?Social History  ? ?Socioeconomic History  ? Marital status: Married  ?  Spouse name: Not on file  ? Number of children: Not on file  ? Years of education: Not on file  ? Highest education level: Not on file  ?Occupational History  ? Not on file  ?Tobacco Use  ? Smoking status: Every Day  ?  Packs/day: 0.50  ?  Years:  30.00  ?  Pack years: 15.00  ?  Types: Cigarettes  ?  Start date: 11/29/1978  ? Smokeless tobacco: Never  ?Vaping Use  ? Vaping Use: Never used  ?Substance and Sexual Activity  ? Alcohol use: Not Currently  ?

## 2022-03-09 NOTE — Sedation Documentation (Signed)
Pt tolerated procedure well ? ?Time 25 mins ?Fentanyl 174mg ?Versed '2mg'$  ?Ancef 2g ?2.4L removed ?

## 2022-03-09 NOTE — Sedation Documentation (Signed)
Vital signs stable. 

## 2022-03-09 NOTE — Sedation Documentation (Signed)
Patient is resting comfortably. Procedure continues 

## 2022-03-09 NOTE — Procedures (Signed)
Interventional Radiology Procedure Note ? ?Procedure: Left pleural PleurX tunneled catheter placement ? ?Complications: None ? ?Estimated Blood Loss: < 5 mL ? ?Findings: ?15 Fr tunneled PleurX placed in left pleural space via lateral mid axillary approach. 2.4 L of clear, yellow fluid removed after catheter placed. ? ?Eulas Post T. Kathlene Cote, M.D ?Pager:  [phone] ? ?  ?

## 2022-03-09 NOTE — Progress Notes (Signed)
Discharging patient home with 4 PleurX catheter drains and PleurX information kit.  ?

## 2022-03-09 NOTE — Telephone Encounter (Signed)
Theresa Rocha w/[facility] contact Dr. Ernestina Penna office stating that Theresa Rocha can accept this patient.   ?

## 2022-03-09 NOTE — Sedation Documentation (Signed)
Vital signs stable. Procedure started 

## 2022-03-10 ENCOUNTER — Other Ambulatory Visit: Payer: Self-pay | Admitting: Student

## 2022-03-10 ENCOUNTER — Other Ambulatory Visit: Payer: Self-pay

## 2022-03-10 MED ORDER — DOCUSATE SODIUM 100 MG PO CAPS: 100.0000 mg | ORAL_CAPSULE | Freq: Two times a day (BID) | ORAL | 0 refills | Status: DC

## 2022-03-10 MED ORDER — PROMETHAZINE HCL 12.5 MG PO TABS: 12.5000 mg | ORAL_TABLET | Freq: Four times a day (QID) | ORAL | 0 refills | Status: DC | PRN

## 2022-03-10 MED ORDER — HYDROCODONE-ACETAMINOPHEN 5-325 MG PO TABS: 1.0000 | ORAL_TABLET | ORAL | 0 refills | Status: DC | PRN

## 2022-03-10 NOTE — Progress Notes (Signed)
Faxed [facility] - PleurX Specialist orders for PleurX Drainage kits at (T [phone]  F [phone]). ?

## 2022-03-11 ENCOUNTER — Other Ambulatory Visit (HOSPITAL_COMMUNITY): Payer: Self-pay | Admitting: Physician Assistant

## 2022-03-11 DIAGNOSIS — C50919 Malignant neoplasm of unspecified site of unspecified female breast: Secondary | ICD-10-CM | POA: Diagnosis not present

## 2022-03-11 DIAGNOSIS — Z4801 Encounter for change or removal of surgical wound dressing: Secondary | ICD-10-CM | POA: Diagnosis not present

## 2022-03-11 DIAGNOSIS — I7300 Raynaud's syndrome without gangrene: Secondary | ICD-10-CM | POA: Diagnosis not present

## 2022-03-11 DIAGNOSIS — E871 Hypo-osmolality and hyponatremia: Secondary | ICD-10-CM | POA: Diagnosis not present

## 2022-03-11 DIAGNOSIS — D630 Anemia in neoplastic disease: Secondary | ICD-10-CM | POA: Diagnosis not present

## 2022-03-11 DIAGNOSIS — F17210 Nicotine dependence, cigarettes, uncomplicated: Secondary | ICD-10-CM | POA: Diagnosis not present

## 2022-03-11 DIAGNOSIS — K219 Gastro-esophageal reflux disease without esophagitis: Secondary | ICD-10-CM | POA: Diagnosis not present

## 2022-03-11 DIAGNOSIS — Z48813 Encounter for surgical aftercare following surgery on the respiratory system: Secondary | ICD-10-CM | POA: Diagnosis not present

## 2022-03-11 DIAGNOSIS — J441 Chronic obstructive pulmonary disease with (acute) exacerbation: Secondary | ICD-10-CM | POA: Diagnosis not present

## 2022-03-11 DIAGNOSIS — M542 Cervicalgia: Secondary | ICD-10-CM | POA: Diagnosis not present

## 2022-03-11 DIAGNOSIS — J910 Malignant pleural effusion: Secondary | ICD-10-CM | POA: Diagnosis not present

## 2022-03-11 DIAGNOSIS — K766 Portal hypertension: Secondary | ICD-10-CM | POA: Diagnosis not present

## 2022-03-11 DIAGNOSIS — Z853 Personal history of malignant neoplasm of breast: Secondary | ICD-10-CM | POA: Diagnosis not present

## 2022-03-11 DIAGNOSIS — K589 Irritable bowel syndrome without diarrhea: Secondary | ICD-10-CM | POA: Diagnosis not present

## 2022-03-11 DIAGNOSIS — R180 Malignant ascites: Secondary | ICD-10-CM | POA: Diagnosis not present

## 2022-03-11 DIAGNOSIS — K7460 Unspecified cirrhosis of liver: Secondary | ICD-10-CM | POA: Diagnosis not present

## 2022-03-11 DIAGNOSIS — C220 Liver cell carcinoma: Secondary | ICD-10-CM | POA: Diagnosis not present

## 2022-03-11 DIAGNOSIS — I700 Atherosclerosis of aorta: Secondary | ICD-10-CM | POA: Diagnosis not present

## 2022-03-11 MED ORDER — OXYCODONE HCL 5 MG PO TABS: 5.0000 mg | ORAL_TABLET | Freq: Four times a day (QID) | ORAL | 0 refills | Status: DC | PRN

## 2022-03-11 MED ORDER — OXYCODONE HCL 5 MG PO TABA: 5.0000 mg | ORAL_TABLET | Freq: Four times a day (QID) | ORAL | 0 refills | Status: DC | PRN

## 2022-03-11 NOTE — Progress Notes (Signed)
Received message her [facility] was out of the Norco originally prescribed yesterday.  When contacting pharmacy, learned several narcotics were back ordered.  Changed Rx to Oxycodone '5mg'$  and called Ms. Brede to inform her of the change.  Also, advised she could add tylenol to the medication if needed.  She expressed understanding. ? ?Electronically Signed: ?Pasty Spillers, PA ?03/11/2022, 12:07 PM ? ? ?

## 2022-03-15 ENCOUNTER — Telehealth: Payer: Self-pay

## 2022-03-15 ENCOUNTER — Other Ambulatory Visit: Payer: Self-pay | Admitting: Hematology

## 2022-03-15 DIAGNOSIS — E871 Hypo-osmolality and hyponatremia: Secondary | ICD-10-CM | POA: Diagnosis not present

## 2022-03-15 DIAGNOSIS — F17210 Nicotine dependence, cigarettes, uncomplicated: Secondary | ICD-10-CM | POA: Diagnosis not present

## 2022-03-15 DIAGNOSIS — Z853 Personal history of malignant neoplasm of breast: Secondary | ICD-10-CM | POA: Diagnosis not present

## 2022-03-15 DIAGNOSIS — C220 Liver cell carcinoma: Secondary | ICD-10-CM | POA: Diagnosis not present

## 2022-03-15 DIAGNOSIS — I700 Atherosclerosis of aorta: Secondary | ICD-10-CM | POA: Diagnosis not present

## 2022-03-15 DIAGNOSIS — D630 Anemia in neoplastic disease: Secondary | ICD-10-CM | POA: Diagnosis not present

## 2022-03-15 DIAGNOSIS — K219 Gastro-esophageal reflux disease without esophagitis: Secondary | ICD-10-CM | POA: Diagnosis not present

## 2022-03-15 DIAGNOSIS — J910 Malignant pleural effusion: Secondary | ICD-10-CM | POA: Diagnosis not present

## 2022-03-15 DIAGNOSIS — Z4801 Encounter for change or removal of surgical wound dressing: Secondary | ICD-10-CM | POA: Diagnosis not present

## 2022-03-15 DIAGNOSIS — K589 Irritable bowel syndrome without diarrhea: Secondary | ICD-10-CM | POA: Diagnosis not present

## 2022-03-15 DIAGNOSIS — K7460 Unspecified cirrhosis of liver: Secondary | ICD-10-CM | POA: Diagnosis not present

## 2022-03-15 DIAGNOSIS — J441 Chronic obstructive pulmonary disease with (acute) exacerbation: Secondary | ICD-10-CM | POA: Diagnosis not present

## 2022-03-15 DIAGNOSIS — M542 Cervicalgia: Secondary | ICD-10-CM | POA: Diagnosis not present

## 2022-03-15 DIAGNOSIS — I7300 Raynaud's syndrome without gangrene: Secondary | ICD-10-CM | POA: Diagnosis not present

## 2022-03-15 DIAGNOSIS — K766 Portal hypertension: Secondary | ICD-10-CM | POA: Diagnosis not present

## 2022-03-15 DIAGNOSIS — Z48813 Encounter for surgical aftercare following surgery on the respiratory system: Secondary | ICD-10-CM | POA: Diagnosis not present

## 2022-03-15 LAB — CULTURE, FUNGUS WITHOUT SMEAR

## 2022-03-15 MED ORDER — OXYCODONE HCL 5 MG PO TABS: 5.0000 mg | ORAL_TABLET | ORAL | 0 refills | Status: DC | PRN

## 2022-03-15 NOTE — Telephone Encounter (Signed)
Pt called requesting a refill on her Oxycodone '5mg'$  IR for pain.  Pt is taking pain medication as needed prior to [facility] drainage for comfort.  Pt stated she was taking 1 pill ('5mg'$ ) as originally prescribed and found no pain relief; therefore, she started taking 2 pills ('10mg'$ ) and found she had no discomfort with her PleuRx drain.  Pt would like to know if [name] could increase her Oxycodone prescription to '10mg'$  IR PRN.  Pt also stated that she had to contact [facility] to request more bottles because she's draining 2L everytime she's draining her PleuRx.  Asked pt how often is she draining her PleuRx.  Pt stated she's draining is 2x/wk.  Instructed pt to drain her PleuRx every other day so she does not have so much fluid build up.  Pt stated the home health nurse said the same thing about draining it every other day.  Notified [name] of pt's request. ?

## 2022-03-18 DIAGNOSIS — I7300 Raynaud's syndrome without gangrene: Secondary | ICD-10-CM | POA: Diagnosis not present

## 2022-03-18 DIAGNOSIS — F17210 Nicotine dependence, cigarettes, uncomplicated: Secondary | ICD-10-CM | POA: Diagnosis not present

## 2022-03-18 DIAGNOSIS — Z48813 Encounter for surgical aftercare following surgery on the respiratory system: Secondary | ICD-10-CM | POA: Diagnosis not present

## 2022-03-18 DIAGNOSIS — J441 Chronic obstructive pulmonary disease with (acute) exacerbation: Secondary | ICD-10-CM | POA: Diagnosis not present

## 2022-03-18 DIAGNOSIS — J910 Malignant pleural effusion: Secondary | ICD-10-CM | POA: Diagnosis not present

## 2022-03-18 DIAGNOSIS — E871 Hypo-osmolality and hyponatremia: Secondary | ICD-10-CM | POA: Diagnosis not present

## 2022-03-18 DIAGNOSIS — I700 Atherosclerosis of aorta: Secondary | ICD-10-CM | POA: Diagnosis not present

## 2022-03-18 DIAGNOSIS — K7460 Unspecified cirrhosis of liver: Secondary | ICD-10-CM | POA: Diagnosis not present

## 2022-03-18 DIAGNOSIS — K589 Irritable bowel syndrome without diarrhea: Secondary | ICD-10-CM | POA: Diagnosis not present

## 2022-03-18 DIAGNOSIS — D630 Anemia in neoplastic disease: Secondary | ICD-10-CM | POA: Diagnosis not present

## 2022-03-18 DIAGNOSIS — K219 Gastro-esophageal reflux disease without esophagitis: Secondary | ICD-10-CM | POA: Diagnosis not present

## 2022-03-18 DIAGNOSIS — M542 Cervicalgia: Secondary | ICD-10-CM | POA: Diagnosis not present

## 2022-03-18 DIAGNOSIS — C220 Liver cell carcinoma: Secondary | ICD-10-CM | POA: Diagnosis not present

## 2022-03-18 DIAGNOSIS — Z4801 Encounter for change or removal of surgical wound dressing: Secondary | ICD-10-CM | POA: Diagnosis not present

## 2022-03-18 DIAGNOSIS — Z853 Personal history of malignant neoplasm of breast: Secondary | ICD-10-CM | POA: Diagnosis not present

## 2022-03-18 DIAGNOSIS — K766 Portal hypertension: Secondary | ICD-10-CM | POA: Diagnosis not present

## 2022-03-22 DIAGNOSIS — K7460 Unspecified cirrhosis of liver: Secondary | ICD-10-CM | POA: Diagnosis not present

## 2022-03-22 DIAGNOSIS — J441 Chronic obstructive pulmonary disease with (acute) exacerbation: Secondary | ICD-10-CM | POA: Diagnosis not present

## 2022-03-22 DIAGNOSIS — K766 Portal hypertension: Secondary | ICD-10-CM | POA: Diagnosis not present

## 2022-03-22 DIAGNOSIS — D630 Anemia in neoplastic disease: Secondary | ICD-10-CM | POA: Diagnosis not present

## 2022-03-22 DIAGNOSIS — M542 Cervicalgia: Secondary | ICD-10-CM | POA: Diagnosis not present

## 2022-03-22 DIAGNOSIS — K219 Gastro-esophageal reflux disease without esophagitis: Secondary | ICD-10-CM | POA: Diagnosis not present

## 2022-03-22 DIAGNOSIS — F17210 Nicotine dependence, cigarettes, uncomplicated: Secondary | ICD-10-CM | POA: Diagnosis not present

## 2022-03-22 DIAGNOSIS — I700 Atherosclerosis of aorta: Secondary | ICD-10-CM | POA: Diagnosis not present

## 2022-03-22 DIAGNOSIS — Z853 Personal history of malignant neoplasm of breast: Secondary | ICD-10-CM | POA: Diagnosis not present

## 2022-03-22 DIAGNOSIS — E871 Hypo-osmolality and hyponatremia: Secondary | ICD-10-CM | POA: Diagnosis not present

## 2022-03-22 DIAGNOSIS — I7300 Raynaud's syndrome without gangrene: Secondary | ICD-10-CM | POA: Diagnosis not present

## 2022-03-22 DIAGNOSIS — C220 Liver cell carcinoma: Secondary | ICD-10-CM | POA: Diagnosis not present

## 2022-03-22 DIAGNOSIS — K589 Irritable bowel syndrome without diarrhea: Secondary | ICD-10-CM | POA: Diagnosis not present

## 2022-03-22 DIAGNOSIS — Z4801 Encounter for change or removal of surgical wound dressing: Secondary | ICD-10-CM | POA: Diagnosis not present

## 2022-03-22 DIAGNOSIS — Z48813 Encounter for surgical aftercare following surgery on the respiratory system: Secondary | ICD-10-CM | POA: Diagnosis not present

## 2022-03-22 DIAGNOSIS — J910 Malignant pleural effusion: Secondary | ICD-10-CM | POA: Diagnosis not present

## 2022-03-24 DIAGNOSIS — K7460 Unspecified cirrhosis of liver: Secondary | ICD-10-CM | POA: Diagnosis not present

## 2022-03-24 DIAGNOSIS — Z4801 Encounter for change or removal of surgical wound dressing: Secondary | ICD-10-CM | POA: Diagnosis not present

## 2022-03-24 DIAGNOSIS — K766 Portal hypertension: Secondary | ICD-10-CM | POA: Diagnosis not present

## 2022-03-24 DIAGNOSIS — I7300 Raynaud's syndrome without gangrene: Secondary | ICD-10-CM | POA: Diagnosis not present

## 2022-03-24 DIAGNOSIS — K219 Gastro-esophageal reflux disease without esophagitis: Secondary | ICD-10-CM | POA: Diagnosis not present

## 2022-03-24 DIAGNOSIS — Z853 Personal history of malignant neoplasm of breast: Secondary | ICD-10-CM | POA: Diagnosis not present

## 2022-03-24 DIAGNOSIS — I700 Atherosclerosis of aorta: Secondary | ICD-10-CM | POA: Diagnosis not present

## 2022-03-24 DIAGNOSIS — M542 Cervicalgia: Secondary | ICD-10-CM | POA: Diagnosis not present

## 2022-03-24 DIAGNOSIS — K589 Irritable bowel syndrome without diarrhea: Secondary | ICD-10-CM | POA: Diagnosis not present

## 2022-03-24 DIAGNOSIS — C220 Liver cell carcinoma: Secondary | ICD-10-CM | POA: Diagnosis not present

## 2022-03-24 DIAGNOSIS — F17210 Nicotine dependence, cigarettes, uncomplicated: Secondary | ICD-10-CM | POA: Diagnosis not present

## 2022-03-24 DIAGNOSIS — E871 Hypo-osmolality and hyponatremia: Secondary | ICD-10-CM | POA: Diagnosis not present

## 2022-03-24 DIAGNOSIS — Z48813 Encounter for surgical aftercare following surgery on the respiratory system: Secondary | ICD-10-CM | POA: Diagnosis not present

## 2022-03-24 DIAGNOSIS — J910 Malignant pleural effusion: Secondary | ICD-10-CM | POA: Diagnosis not present

## 2022-03-24 DIAGNOSIS — J441 Chronic obstructive pulmonary disease with (acute) exacerbation: Secondary | ICD-10-CM | POA: Diagnosis not present

## 2022-03-24 DIAGNOSIS — D630 Anemia in neoplastic disease: Secondary | ICD-10-CM | POA: Diagnosis not present

## 2022-03-26 ENCOUNTER — Telehealth: Payer: Self-pay

## 2022-03-26 DIAGNOSIS — K766 Portal hypertension: Secondary | ICD-10-CM | POA: Diagnosis not present

## 2022-03-26 DIAGNOSIS — I700 Atherosclerosis of aorta: Secondary | ICD-10-CM | POA: Diagnosis not present

## 2022-03-26 DIAGNOSIS — C220 Liver cell carcinoma: Secondary | ICD-10-CM | POA: Diagnosis not present

## 2022-03-26 DIAGNOSIS — I7300 Raynaud's syndrome without gangrene: Secondary | ICD-10-CM | POA: Diagnosis not present

## 2022-03-26 DIAGNOSIS — E871 Hypo-osmolality and hyponatremia: Secondary | ICD-10-CM | POA: Diagnosis not present

## 2022-03-26 DIAGNOSIS — M542 Cervicalgia: Secondary | ICD-10-CM | POA: Diagnosis not present

## 2022-03-26 DIAGNOSIS — K589 Irritable bowel syndrome without diarrhea: Secondary | ICD-10-CM | POA: Diagnosis not present

## 2022-03-26 DIAGNOSIS — J910 Malignant pleural effusion: Secondary | ICD-10-CM | POA: Diagnosis not present

## 2022-03-26 DIAGNOSIS — K219 Gastro-esophageal reflux disease without esophagitis: Secondary | ICD-10-CM | POA: Diagnosis not present

## 2022-03-26 DIAGNOSIS — Z4801 Encounter for change or removal of surgical wound dressing: Secondary | ICD-10-CM | POA: Diagnosis not present

## 2022-03-26 DIAGNOSIS — K7460 Unspecified cirrhosis of liver: Secondary | ICD-10-CM | POA: Diagnosis not present

## 2022-03-26 DIAGNOSIS — J441 Chronic obstructive pulmonary disease with (acute) exacerbation: Secondary | ICD-10-CM | POA: Diagnosis not present

## 2022-03-26 DIAGNOSIS — D630 Anemia in neoplastic disease: Secondary | ICD-10-CM | POA: Diagnosis not present

## 2022-03-26 DIAGNOSIS — Z48813 Encounter for surgical aftercare following surgery on the respiratory system: Secondary | ICD-10-CM | POA: Diagnosis not present

## 2022-03-26 DIAGNOSIS — Z853 Personal history of malignant neoplasm of breast: Secondary | ICD-10-CM | POA: Diagnosis not present

## 2022-03-26 DIAGNOSIS — F17210 Nicotine dependence, cigarettes, uncomplicated: Secondary | ICD-10-CM | POA: Diagnosis not present

## 2022-03-26 NOTE — Telephone Encounter (Signed)
Pt called stating she's pulling off 1L every 3 days and is not getting much relief.  Pt stated she feels like she could have had more removed than the 1L the home health RN removed.  Informed pt that she should be draining her Pluerx every other day and to not remove more than 1L at a time.  Pt stated she was not aware of that until today when the home health mentioned it to her.  Pt stated that she and her husband will start draining her Pleurx starting Monday every other day as ordered.  Pt stated she is low on Pleurx drain kits but will contact [facility] to refill supplies.  Informed pt that once she starts draining her Pleurx every other day as prescribed, Dr. Ernestina Penna office will be able to monitor and determine how fast the pt is gathering fluid.  Informed pt that [name] will adjust her drainage orders at that time.  Pt c/o of dry cough throughout the day but worse at night d/t it prevents the pt from sleeping at night.  Pt would like to know if there is something she can do or take for her cough.  Informed pt that this RN would notify [name] of the pt's call and complaints.  Sent Patient Call message to [name]. ?

## 2022-03-29 ENCOUNTER — Telehealth: Payer: Self-pay | Admitting: Hematology

## 2022-03-29 DIAGNOSIS — J90 Pleural effusion, not elsewhere classified: Secondary | ICD-10-CM | POA: Diagnosis not present

## 2022-03-29 DIAGNOSIS — C50919 Malignant neoplasm of unspecified site of unspecified female breast: Secondary | ICD-10-CM | POA: Diagnosis not present

## 2022-03-29 DIAGNOSIS — K7469 Other cirrhosis of liver: Secondary | ICD-10-CM | POA: Diagnosis not present

## 2022-03-29 DIAGNOSIS — R180 Malignant ascites: Secondary | ICD-10-CM | POA: Diagnosis not present

## 2022-03-29 NOTE — Telephone Encounter (Signed)
.  Called pt per 5/1 inbasket , Patient was unavailable, a message with appt time and date was left with number on file.   ?

## 2022-04-05 ENCOUNTER — Other Ambulatory Visit: Payer: Self-pay

## 2022-04-05 ENCOUNTER — Telehealth: Payer: Self-pay

## 2022-04-05 NOTE — Progress Notes (Signed)
New PleuRx orders faxed to [facility] for pt to drain Pleurx daily.  Per [name] pt can drain 1.5L and increase her hydration to insure the pt does not get dehydrated. ?

## 2022-04-05 NOTE — Telephone Encounter (Signed)
This nurse attempted to reach patient two times.  Left a message with new orders.  To drain daily 1.5 L, encouraged patient to stay hydrated.  New orders have been faxed to [facility] for pleurex supplies.  No further concerns at this time.   ?

## 2022-04-06 DIAGNOSIS — C50919 Malignant neoplasm of unspecified site of unspecified female breast: Secondary | ICD-10-CM | POA: Diagnosis not present

## 2022-04-06 DIAGNOSIS — R180 Malignant ascites: Secondary | ICD-10-CM | POA: Diagnosis not present

## 2022-04-07 ENCOUNTER — Other Ambulatory Visit: Payer: Self-pay

## 2022-04-07 DIAGNOSIS — C220 Liver cell carcinoma: Secondary | ICD-10-CM

## 2022-04-07 DIAGNOSIS — Z170 Estrogen receptor positive status [ER+]: Secondary | ICD-10-CM

## 2022-04-08 ENCOUNTER — Ambulatory Visit (HOSPITAL_COMMUNITY)
Admission: RE | Admit: 2022-04-08 | Discharge: 2022-04-08 | Disposition: A | Discharge status: Home / Self Care | Payer: BC Managed Care – PPO | Source: Ambulatory Visit | Attending: Hematology | Admitting: Hematology

## 2022-04-08 ENCOUNTER — Inpatient Hospital Stay (HOSPITAL_BASED_OUTPATIENT_CLINIC_OR_DEPARTMENT_OTHER): Payer: BC Managed Care – PPO | Admitting: Hematology

## 2022-04-08 ENCOUNTER — Other Ambulatory Visit: Payer: Self-pay

## 2022-04-08 ENCOUNTER — Inpatient Hospital Stay: Payer: BC Managed Care – PPO | Attending: Hematology

## 2022-04-08 ENCOUNTER — Telehealth: Payer: Self-pay

## 2022-04-08 ENCOUNTER — Encounter: Payer: Self-pay | Admitting: Hematology

## 2022-04-08 VITALS — BP 88/66 | HR 106 | Temp 98.4°F | Resp 18 | Ht 63.0 in | Wt 95.4 lb

## 2022-04-08 DIAGNOSIS — M810 Age-related osteoporosis without current pathological fracture: Secondary | ICD-10-CM | POA: Insufficient documentation

## 2022-04-08 DIAGNOSIS — Z170 Estrogen receptor positive status [ER+]: Secondary | ICD-10-CM

## 2022-04-08 DIAGNOSIS — Z1231 Encounter for screening mammogram for malignant neoplasm of breast: Secondary | ICD-10-CM | POA: Diagnosis not present

## 2022-04-08 DIAGNOSIS — J90 Pleural effusion, not elsewhere classified: Secondary | ICD-10-CM | POA: Diagnosis not present

## 2022-04-08 DIAGNOSIS — C50111 Malignant neoplasm of central portion of right female breast: Secondary | ICD-10-CM | POA: Diagnosis not present

## 2022-04-08 DIAGNOSIS — C220 Liver cell carcinoma: Secondary | ICD-10-CM | POA: Insufficient documentation

## 2022-04-08 LAB — CBC WITH DIFFERENTIAL (CANCER CENTER ONLY)
Abs Immature Granulocytes: 0.07 10*3/uL (ref 0.00–0.07)
Basophils Absolute: 0.1 10*3/uL (ref 0.0–0.1)
Basophils Relative: 1 %
Eosinophils Absolute: 0.1 10*3/uL (ref 0.0–0.5)
Eosinophils Relative: 1 %
HCT: 35.2 % — ABNORMAL LOW (ref 36.0–46.0)
Hemoglobin: 11.9 g/dL — ABNORMAL LOW (ref 12.0–15.0)
Immature Granulocytes: 1 %
Lymphocytes Relative: 7 %
Lymphs Abs: 0.6 10*3/uL — ABNORMAL LOW (ref 0.7–4.0)
MCH: 25.3 pg — ABNORMAL LOW (ref 26.0–34.0)
MCHC: 33.8 g/dL (ref 30.0–36.0)
MCV: 74.9 fL — ABNORMAL LOW (ref 80.0–100.0)
Monocytes Absolute: 1.2 10*3/uL — ABNORMAL HIGH (ref 0.1–1.0)
Monocytes Relative: 15 %
Neutro Abs: 6 10*3/uL (ref 1.7–7.7)
Neutrophils Relative %: 75 %
Platelet Count: 106 10*3/uL — ABNORMAL LOW (ref 150–400)
RBC: 4.7 MIL/uL (ref 3.87–5.11)
RDW: 20.3 % — ABNORMAL HIGH (ref 11.5–15.5)
WBC Count: 7.9 10*3/uL (ref 4.0–10.5)
nRBC: 0 % (ref 0.0–0.2)

## 2022-04-08 LAB — CMP (CANCER CENTER ONLY)
ALT: 24 U/L (ref 0–44)
AST: 44 U/L — ABNORMAL HIGH (ref 15–41)
Albumin: 2.7 g/dL — ABNORMAL LOW (ref 3.5–5.0)
Alkaline Phosphatase: 88 U/L (ref 38–126)
Anion gap: 6 (ref 5–15)
BUN: 8 mg/dL (ref 6–20)
CO2: 22 mmol/L (ref 22–32)
Calcium: 8 mg/dL — ABNORMAL LOW (ref 8.9–10.3)
Chloride: 93 mmol/L — ABNORMAL LOW (ref 98–111)
Creatinine: 0.55 mg/dL (ref 0.44–1.00)
GFR, Estimated: >60 mL/min (ref >60)
Glucose, Bld: 101 mg/dL — ABNORMAL HIGH (ref 70–99)
Potassium: 4.4 mmol/L (ref 3.5–5.1)
Sodium: 121 mmol/L — ABNORMAL LOW (ref 135–145)
Total Bilirubin: 2.4 mg/dL — ABNORMAL HIGH (ref 0.3–1.2)
Total Protein: 6.2 g/dL — ABNORMAL LOW (ref 6.5–8.1)

## 2022-04-08 NOTE — Telephone Encounter (Signed)
Spoke with pt via telephone regarding her diuretics and f/u lab appt.  Instructed pt to stop taking her diuretics per [name] who has consulted with Dr. Rodena Piety regarding pt's Pluerx draining frequency.  Pt verbalized understanding of instructions.  Also, scheduled appt 1 wk from today for f/u labs per Dr. Ernestina Penna request.  Pt did not want to come in on her actual birthday, therefore, scheduled pt on 04/14/2022.  Pt confirmed appt.  Pt had no further questions or concerns at this time. ?

## 2022-04-08 NOTE — Progress Notes (Signed)
?[facility]   ?[phone] [phone]   ?Clinic Follow up Note  ? ?Patient Care Team: ?Seward Carol, MD as PCP - General (Internal Medicine) ?Placke, Dawn, RN as Oncology Nurse Navigator ?Irene Shipper, MD as Consulting Physician (Gastroenterology) ?Truitt Merle, MD as Consulting Physician (Hematology) ?Alla Feeling, NP as Nurse Practitioner (Nurse Practitioner) ?Stark Klein, MD as Consulting Physician (General Surgery) ?Aletta Edouard, MD as Consulting Physician (Interventional Radiology) ?Drazek, Theresa Rocha, CRNP as Designer, jewellery (Nurse Practitioner) ?Mauro Kaufmann, RN as Oncology Nurse Navigator ?Rockwell Germany, RN as Oncology Nurse Navigator ?Eppie Gibson, MD as Attending Physician (Radiation Oncology) ?Trula Slade, DPM as Consulting Physician (Podiatry) ? ?Date of Service:  04/08/2022 ? ?CHIEF COMPLAINT: f/u of multifocal HCC, right breast cancer ? ?CURRENT THERAPY:  ?-Antiestrogen therapy, started 09/2019, currently tamoxifen since 03/2020 ? ?ASSESSMENT & PLAN:  ?Theresa Rocha is a 56 y.o. female with  ? ?1. 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 ?-PET scan negative for thoracic metastasis ?-she required PleurX catheter placement 03/09/22. She reports she is draining it daily now with 1L removal daily, but still quite symptomatic with dyspnea. ?-She complains of left shoulder blade pain, possible related to the Pleurx irritation.  We will obtain chest x-ray today. ?-I will reach out to to cardiothoracic surgeon to see if she is a candidate for pleurodesis, would consider pleural biopsy during this procedure, to rule out a malignancy or other etiology of her  pleural effusion. ? ?2. Hypotension ?-BP is down to 88/66 today.  She has orthostatic hypotension.  She has intermittent dizziness.   ?-I reached out to Roosevelt Locks, NP to discuss her diuretics. She recommends to hold both furosemide and eplerenone and fluids restriction  ? ?3. Multifocal Hepatocellular carcinoma, cT2N0Mx, stage II ?-presented with abdominal pain and rectal bleeding in 06/2019. Work up scans showed multifocal lesions of liver consistent with [place] with indeterminate right lobe lesions. AFP was WNL. ?-she was found to not be a transplant candidate due to history of breast cancer and multifocal HCC beyond Milan criteria. She was also not felt to be a surgical candidate due to her liver function and location of tumors. She has been on surveillance. ?-she was initially not felt to be a good candidate for liver-targeted ablation. She was recently found to have enlarging left lobe liver lesions on surveillance CT in 06/2021. She proceeded with left lobe ablation on 09/16/21 with Dr. Kathlene Cote. ?-PET on 12/15/21 showed hypermetabolism only to the right hepatic lobe, no other distant metastasis. She underwent right lobe Y90 on 12/22/21 with Dr. Kathlene Cote. ?-most recent abdomen MRI on 02/26/22 showed: decreased size of bilateral liver lesions, both considered LR TR equivocal; no new lesions. ?  ?4. 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  ?-Recurrence Score 21 (low risk)  ?-She completed adjuvant RT with Dr Isidore Moos 12/03/19-12/31/19 ?-She started anastrozole in 09/2019. Due to knee joint pain, she was switched to Tamoxifen in 03/2020. She is tolerating with hot flashes. Will continue to complete 10 years.  ?-most recent mammogram from 09/2020 was negative. ?-From a breast cancer standpoint, She is clinically doing well. She is,  however, overdue for annual mammogram. ?  ?5. 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  ?-s/p harvoni and ribavirin treatment for Hep C per Dr. Bradd Burner in 2018 ?-EGD on 10/27/21 by Dr. Henrene Pastor while hospitalized showed esophageal varices, which were banded at that time. No residual varices on repeat EGD on 01/04/22. ?-Continue to F/u with Dr Henrene Pastor and Roosevelt Locks ?  ?6. Alcohol and smoking Cessation  ?-She has h/o smoking 0.5 PPD x35 years. She drank 3-4 cans beer nightly x20 years. ?-Cessation of both was discussed and strongly encouraged. She has stopped drinking alcohol but smokes 5 cigarettes a day still. I encouraged her to continue reducing until she quits completely.  ?  ?7. Osteoporosis, Hypocalcemia  ?-DEXA from 09/21/19 at her GYN office showed osteoporosis T score -3.1 at [facility].   ?-I started her on Zometa q28months for 2 years on 07/17/20. Held before 3rd dose on 05/01/21, due to mouth sores. She has full sets of dentures and has not seen dentist in years. ?-I previously recommended her to start Calcium supplement daily  ?  ?8. Thrombocytopenia, [place] ?-secondary to liver cirrhosis, splenomegaly, and/or varices ?-slowly improving, hgb 11.9 and plt 106k today (04/08/22) ?  ?9. Hyponatremia  ?-Secondary to diuretics and pleural effusion draining ?-We will monitor closely ?  ?PLAN:  ?-chest x-ray today ?-overdue for mammogram, I ordered today and encouraged her to proceed anytime in near future  ?-I spoke with liver clinic NP Theresa Rocha today, she recommends to hold furosemide and eplerenone for now and repeat lab in a week  ?-I will reach out to cardiothoracic surgeon to see if she is a candidate for pleurodesis and pleural biopsy ?-We will coordinate with Dr. Kathlene Cote about her next liver MRI scan ?-lab and f/u in 2 months ? ? ?No problem-specific Assessment & Plan notes found for this encounter. ? ? ?SUMMARY OF ONCOLOGIC HISTORY: ?Oncology History Overview Note  ?Cancer Staging ?Hepatocellular carcinoma  ([place]) ?Staging form: Liver, AJCC 8th Edition ?- Clinical stage from 08/02/2019: Stage II (cT2, cN0, cM0) - Signed by Truitt Merle, MD on 08/02/2019 ? ?Malignant neoplasm of central portion of right breast in female, estrogen receptor positive ([place]) ?Staging form: Breast, AJCC 8th Edition ?- Clinical stage from 08/29/2019: Stage IB (cT2, cN0, cM0, G2, ER+, PR+, HER2-) - Signed by Truitt Merle, MD on 09/05/2019 ? ?  ?Hepatocellular carcinoma ([place])  ?07/04/2019 Imaging  ? CT [facility] IMPRESSION: ?1. Findings of cirrhosis and hepatomegaly.  Patent portal vein. ?2. Numerous varicosities as described above. ?3. Moderate to large amount of abdominopelvic ascites ?4. Contrast filled appendix which appears to be a mildly dilated ?with apparent wall thickening which could be due to surrounding ?ascites. ?  ?07/09/2019 Imaging  ? ABD US IMPRESSION: ?1. Three small hypoechoic lesions in the RIGHT hepatic lobe ?measuring less than 1 cm but not evident on comparison contrast ?enhanced CT from 07/04/2019. Recommend MRI of the abdomen without ?and with contrast to evaluate these hepatic lesions in cirrhotic ?patient. ?2. Increased liver echogenicity and lobular contour consistent ?cirrhosis. ?3. Moderate mild to moderate volume of ascites. ?4. Mild gallbladder wall thickening likely related to ascites. ?5. No cholelithiasis or cholecystitis evident ?  ?07/26/2019 Imaging  ? MR ABD IMPRESSION: ?1. In the lateral segment left hepatic lobe there are two LI-RADS ?category LR-5 lesions representing hepatocellular carcinoma. ?2. There is also a LI-RADS category LR-3 lesion peripherally in the ?right hepatic  lobe, intermediate probability for hepatocellular ?carcinoma. ?3. Hepatic cirrhosis and diffuse wall thickening of the gallbladder. ?Widespread steatosis in the liver. ?4. 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 ?5.  Aortic Atherosclerosis (ICD10-I70.0). ?6. Widespread ascites and mesenteric edema. ?  ?07/26/2019 Tumor Marker  ? AFP  5.5 (baseline) ?  ?08/02/2019 Initial Diagnosis  ? Hepatocellular carcinoma ([place]) ?  ?08/02/2019 Cancer Staging  ? Staging form: Liver, AJCC 8th Edition ?- Clinical stage from 08/02/2019: Stage II (cT2, cN0

## 2022-04-09 ENCOUNTER — Ambulatory Visit: Payer: BC Managed Care – PPO | Admitting: Hematology

## 2022-04-09 ENCOUNTER — Other Ambulatory Visit: Payer: BC Managed Care – PPO

## 2022-04-09 LAB — AFP TUMOR MARKER: AFP, Serum, Tumor Marker: 153 ng/mL — ABNORMAL HIGH (ref 0.0–9.2)

## 2022-04-11 ENCOUNTER — Other Ambulatory Visit: Payer: Self-pay | Admitting: Hematology

## 2022-04-11 DIAGNOSIS — C220 Liver cell carcinoma: Secondary | ICD-10-CM

## 2022-04-14 ENCOUNTER — Other Ambulatory Visit: Payer: Self-pay

## 2022-04-14 ENCOUNTER — Telehealth: Payer: Self-pay

## 2022-04-14 ENCOUNTER — Inpatient Hospital Stay: Payer: BC Managed Care – PPO

## 2022-04-14 ENCOUNTER — Emergency Department (HOSPITAL_COMMUNITY): Payer: BC Managed Care – PPO

## 2022-04-14 ENCOUNTER — Inpatient Hospital Stay (HOSPITAL_COMMUNITY)
Admission: EM | Admit: 2022-04-14 | Discharge: 2022-05-03 | DRG: 640 | Disposition: A | Discharge status: Home Health | Payer: BC Managed Care – PPO | Source: Ambulatory Visit | Attending: Internal Medicine | Admitting: Internal Medicine

## 2022-04-14 ENCOUNTER — Encounter (HOSPITAL_COMMUNITY): Payer: Self-pay | Admitting: Family Medicine

## 2022-04-14 DIAGNOSIS — N179 Acute kidney failure, unspecified: Secondary | ICD-10-CM | POA: Diagnosis not present

## 2022-04-14 DIAGNOSIS — D6959 Other secondary thrombocytopenia: Secondary | ICD-10-CM | POA: Diagnosis not present

## 2022-04-14 DIAGNOSIS — Z170 Estrogen receptor positive status [ER+]: Secondary | ICD-10-CM

## 2022-04-14 DIAGNOSIS — Z885 Allergy status to narcotic agent status: Secondary | ICD-10-CM

## 2022-04-14 DIAGNOSIS — F17210 Nicotine dependence, cigarettes, uncomplicated: Secondary | ICD-10-CM | POA: Diagnosis present

## 2022-04-14 DIAGNOSIS — D696 Thrombocytopenia, unspecified: Secondary | ICD-10-CM | POA: Diagnosis present

## 2022-04-14 DIAGNOSIS — I959 Hypotension, unspecified: Secondary | ICD-10-CM | POA: Diagnosis present

## 2022-04-14 DIAGNOSIS — F1021 Alcohol dependence, in remission: Secondary | ICD-10-CM | POA: Diagnosis present

## 2022-04-14 DIAGNOSIS — J9621 Acute and chronic respiratory failure with hypoxia: Secondary | ICD-10-CM | POA: Insufficient documentation

## 2022-04-14 DIAGNOSIS — E875 Hyperkalemia: Secondary | ICD-10-CM | POA: Diagnosis not present

## 2022-04-14 DIAGNOSIS — K5900 Constipation, unspecified: Secondary | ICD-10-CM | POA: Diagnosis not present

## 2022-04-14 DIAGNOSIS — E871 Hypo-osmolality and hyponatremia: Principal | ICD-10-CM | POA: Diagnosis present

## 2022-04-14 DIAGNOSIS — D638 Anemia in other chronic diseases classified elsewhere: Secondary | ICD-10-CM | POA: Diagnosis present

## 2022-04-14 DIAGNOSIS — T502X5A Adverse effect of carbonic-anhydrase inhibitors, benzothiadiazides and other diuretics, initial encounter: Secondary | ICD-10-CM | POA: Diagnosis present

## 2022-04-14 DIAGNOSIS — E8809 Other disorders of plasma-protein metabolism, not elsewhere classified: Secondary | ICD-10-CM | POA: Diagnosis present

## 2022-04-14 DIAGNOSIS — C220 Liver cell carcinoma: Secondary | ICD-10-CM | POA: Diagnosis not present

## 2022-04-14 DIAGNOSIS — I7300 Raynaud's syndrome without gangrene: Secondary | ICD-10-CM | POA: Diagnosis present

## 2022-04-14 DIAGNOSIS — Z8619 Personal history of other infectious and parasitic diseases: Secondary | ICD-10-CM

## 2022-04-14 DIAGNOSIS — Z853 Personal history of malignant neoplasm of breast: Secondary | ICD-10-CM

## 2022-04-14 DIAGNOSIS — R161 Splenomegaly, not elsewhere classified: Secondary | ICD-10-CM | POA: Diagnosis present

## 2022-04-14 DIAGNOSIS — E861 Hypovolemia: Secondary | ICD-10-CM | POA: Diagnosis present

## 2022-04-14 DIAGNOSIS — R188 Other ascites: Secondary | ICD-10-CM | POA: Diagnosis present

## 2022-04-14 DIAGNOSIS — Z923 Personal history of irradiation: Secondary | ICD-10-CM

## 2022-04-14 DIAGNOSIS — Z20822 Contact with and (suspected) exposure to covid-19: Secondary | ICD-10-CM | POA: Diagnosis not present

## 2022-04-14 DIAGNOSIS — Z79810 Long term (current) use of selective estrogen receptor modulators (SERMs): Secondary | ICD-10-CM

## 2022-04-14 DIAGNOSIS — K766 Portal hypertension: Secondary | ICD-10-CM | POA: Diagnosis present

## 2022-04-14 DIAGNOSIS — I8510 Secondary esophageal varices without bleeding: Secondary | ICD-10-CM | POA: Diagnosis not present

## 2022-04-14 DIAGNOSIS — Z681 Body mass index (BMI) 19 or less, adult: Secondary | ICD-10-CM

## 2022-04-14 DIAGNOSIS — K7460 Unspecified cirrhosis of liver: Secondary | ICD-10-CM | POA: Diagnosis present

## 2022-04-14 DIAGNOSIS — C50911 Malignant neoplasm of unspecified site of right female breast: Secondary | ICD-10-CM

## 2022-04-14 DIAGNOSIS — Z452 Encounter for adjustment and management of vascular access device: Secondary | ICD-10-CM | POA: Diagnosis not present

## 2022-04-14 DIAGNOSIS — K767 Hepatorenal syndrome: Secondary | ICD-10-CM | POA: Diagnosis not present

## 2022-04-14 DIAGNOSIS — D649 Anemia, unspecified: Secondary | ICD-10-CM | POA: Insufficient documentation

## 2022-04-14 DIAGNOSIS — K589 Irritable bowel syndrome without diarrhea: Secondary | ICD-10-CM | POA: Diagnosis present

## 2022-04-14 DIAGNOSIS — R918 Other nonspecific abnormal finding of lung field: Secondary | ICD-10-CM | POA: Diagnosis not present

## 2022-04-14 DIAGNOSIS — R531 Weakness: Secondary | ICD-10-CM | POA: Diagnosis not present

## 2022-04-14 DIAGNOSIS — J918 Pleural effusion in other conditions classified elsewhere: Secondary | ICD-10-CM | POA: Diagnosis not present

## 2022-04-14 DIAGNOSIS — J9811 Atelectasis: Secondary | ICD-10-CM | POA: Diagnosis not present

## 2022-04-14 DIAGNOSIS — J449 Chronic obstructive pulmonary disease, unspecified: Secondary | ICD-10-CM | POA: Diagnosis not present

## 2022-04-14 DIAGNOSIS — E739 Lactose intolerance, unspecified: Secondary | ICD-10-CM | POA: Diagnosis present

## 2022-04-14 DIAGNOSIS — R6510 Systemic inflammatory response syndrome (SIRS) of non-infectious origin without acute organ dysfunction: Secondary | ICD-10-CM | POA: Diagnosis not present

## 2022-04-14 DIAGNOSIS — Z801 Family history of malignant neoplasm of trachea, bronchus and lung: Secondary | ICD-10-CM

## 2022-04-14 DIAGNOSIS — K7469 Other cirrhosis of liver: Secondary | ICD-10-CM | POA: Diagnosis not present

## 2022-04-14 DIAGNOSIS — E43 Unspecified severe protein-calorie malnutrition: Secondary | ICD-10-CM | POA: Diagnosis not present

## 2022-04-14 DIAGNOSIS — K219 Gastro-esophageal reflux disease without esophagitis: Secondary | ICD-10-CM | POA: Diagnosis present

## 2022-04-14 DIAGNOSIS — Z4682 Encounter for fitting and adjustment of non-vascular catheter: Secondary | ICD-10-CM | POA: Diagnosis not present

## 2022-04-14 DIAGNOSIS — Z79899 Other long term (current) drug therapy: Secondary | ICD-10-CM

## 2022-04-14 DIAGNOSIS — B1920 Unspecified viral hepatitis C without hepatic coma: Secondary | ICD-10-CM

## 2022-04-14 DIAGNOSIS — K7030 Alcoholic cirrhosis of liver without ascites: Secondary | ICD-10-CM

## 2022-04-14 DIAGNOSIS — J90 Pleural effusion, not elsewhere classified: Secondary | ICD-10-CM | POA: Diagnosis present

## 2022-04-14 DIAGNOSIS — R1110 Vomiting, unspecified: Secondary | ICD-10-CM | POA: Diagnosis not present

## 2022-04-14 DIAGNOSIS — Z800 Family history of malignant neoplasm of digestive organs: Secondary | ICD-10-CM

## 2022-04-14 DIAGNOSIS — Z886 Allergy status to analgesic agent status: Secondary | ICD-10-CM

## 2022-04-14 DIAGNOSIS — C50919 Malignant neoplasm of unspecified site of unspecified female breast: Secondary | ICD-10-CM

## 2022-04-14 LAB — URINALYSIS, ROUTINE W REFLEX MICROSCOPIC
Bilirubin Urine: NEGATIVE
Glucose, UA: NEGATIVE mg/dL
Hgb urine dipstick: NEGATIVE
Ketones, ur: 5 mg/dL — AB
Leukocytes,Ua: NEGATIVE
Nitrite: NEGATIVE
Protein, ur: NEGATIVE mg/dL
Specific Gravity, Urine: 1.029 (ref 1.005–1.030)
pH: 5 (ref 5.0–8.0)

## 2022-04-14 LAB — CBC WITH DIFFERENTIAL (CANCER CENTER ONLY)
Abs Immature Granulocytes: 0.08 10*3/uL — ABNORMAL HIGH (ref 0.00–0.07)
Basophils Absolute: 0.1 10*3/uL (ref 0.0–0.1)
Basophils Relative: 1 %
Eosinophils Absolute: 0.1 10*3/uL (ref 0.0–0.5)
Eosinophils Relative: 1 %
HCT: 37.4 % (ref 36.0–46.0)
Hemoglobin: 12.3 g/dL (ref 12.0–15.0)
Immature Granulocytes: 1 %
Lymphocytes Relative: 6 %
Lymphs Abs: 0.6 10*3/uL — ABNORMAL LOW (ref 0.7–4.0)
MCH: 25 pg — ABNORMAL LOW (ref 26.0–34.0)
MCHC: 32.9 g/dL (ref 30.0–36.0)
MCV: 76 fL — ABNORMAL LOW (ref 80.0–100.0)
Monocytes Absolute: 1.4 10*3/uL — ABNORMAL HIGH (ref 0.1–1.0)
Monocytes Relative: 14 %
Neutro Abs: 7.8 10*3/uL — ABNORMAL HIGH (ref 1.7–7.7)
Neutrophils Relative %: 77 %
Platelet Count: 125 10*3/uL — ABNORMAL LOW (ref 150–400)
RBC: 4.92 MIL/uL (ref 3.87–5.11)
RDW: 19.9 % — ABNORMAL HIGH (ref 11.5–15.5)
WBC Count: 10 10*3/uL (ref 4.0–10.5)
nRBC: 0 % (ref 0.0–0.2)

## 2022-04-14 LAB — COMPREHENSIVE METABOLIC PANEL
ALT: 32 U/L (ref 0–44)
AST: 61 U/L — ABNORMAL HIGH (ref 15–41)
Albumin: 2.6 g/dL — ABNORMAL LOW (ref 3.5–5.0)
Alkaline Phosphatase: 95 U/L (ref 38–126)
Anion gap: 6 (ref 5–15)
BUN: 13 mg/dL (ref 6–20)
CO2: 21 mmol/L — ABNORMAL LOW (ref 22–32)
Calcium: 8.4 mg/dL — ABNORMAL LOW (ref 8.9–10.3)
Chloride: 97 mmol/L — ABNORMAL LOW (ref 98–111)
Creatinine, Ser: 0.54 mg/dL (ref 0.44–1.00)
GFR, Estimated: >60 mL/min (ref >60)
Glucose, Bld: 94 mg/dL (ref 70–99)
Potassium: 6 mmol/L — ABNORMAL HIGH (ref 3.5–5.1)
Sodium: 124 mmol/L — ABNORMAL LOW (ref 135–145)
Total Bilirubin: 2.1 mg/dL — ABNORMAL HIGH (ref 0.3–1.2)
Total Protein: 6.3 g/dL — ABNORMAL LOW (ref 6.5–8.1)

## 2022-04-14 LAB — CMP (CANCER CENTER ONLY)
ALT: 27 U/L (ref 0–44)
AST: 57 U/L — ABNORMAL HIGH (ref 15–41)
Albumin: 2.8 g/dL — ABNORMAL LOW (ref 3.5–5.0)
Alkaline Phosphatase: 96 U/L (ref 38–126)
Anion gap: 5 (ref 5–15)
BUN: 11 mg/dL (ref 6–20)
CO2: 20 mmol/L — ABNORMAL LOW (ref 22–32)
Calcium: 8.3 mg/dL — ABNORMAL LOW (ref 8.9–10.3)
Chloride: 94 mmol/L — ABNORMAL LOW (ref 98–111)
Creatinine: 0.55 mg/dL (ref 0.44–1.00)
GFR, Estimated: >60 mL/min (ref >60)
Glucose, Bld: 114 mg/dL — ABNORMAL HIGH (ref 70–99)
Potassium: 5.9 mmol/L — ABNORMAL HIGH (ref 3.5–5.1)
Sodium: 119 mmol/L — CL (ref 135–145)
Total Bilirubin: 2.2 mg/dL — ABNORMAL HIGH (ref 0.3–1.2)
Total Protein: 6.2 g/dL — ABNORMAL LOW (ref 6.5–8.1)

## 2022-04-14 LAB — CBC WITH DIFFERENTIAL/PLATELET
Abs Immature Granulocytes: 0.08 10*3/uL — ABNORMAL HIGH (ref 0.00–0.07)
Basophils Absolute: 0.1 10*3/uL (ref 0.0–0.1)
Basophils Relative: 1 %
Eosinophils Absolute: 0.1 10*3/uL (ref 0.0–0.5)
Eosinophils Relative: 1 %
HCT: 38.7 % (ref 36.0–46.0)
Hemoglobin: 12.5 g/dL (ref 12.0–15.0)
Immature Granulocytes: 1 %
Lymphocytes Relative: 7 %
Lymphs Abs: 0.7 10*3/uL (ref 0.7–4.0)
MCH: 25.1 pg — ABNORMAL LOW (ref 26.0–34.0)
MCHC: 32.3 g/dL (ref 30.0–36.0)
MCV: 77.7 fL — ABNORMAL LOW (ref 80.0–100.0)
Monocytes Absolute: 1.6 10*3/uL — ABNORMAL HIGH (ref 0.1–1.0)
Monocytes Relative: 16 %
Neutro Abs: 7.2 10*3/uL (ref 1.7–7.7)
Neutrophils Relative %: 74 %
Platelets: 125 10*3/uL — ABNORMAL LOW (ref 150–400)
RBC: 4.98 MIL/uL (ref 3.87–5.11)
RDW: 20.9 % — ABNORMAL HIGH (ref 11.5–15.5)
WBC: 9.7 10*3/uL (ref 4.0–10.5)
nRBC: 0 % (ref 0.0–0.2)

## 2022-04-14 LAB — NA AND K (SODIUM & POTASSIUM), RAND UR
Potassium Urine: 144 mmol/L
Sodium, Ur: <10 mmol/L

## 2022-04-14 LAB — CREATININE, URINE, RANDOM: Creatinine, Urine: 252.7 mg/dL

## 2022-04-14 LAB — LIPASE, BLOOD: Lipase: 71 U/L — ABNORMAL HIGH (ref 11–51)

## 2022-04-14 LAB — OSMOLALITY, URINE: Osmolality, Ur: 924 mOsm/kg — ABNORMAL HIGH (ref 300–900)

## 2022-04-14 LAB — MAGNESIUM: Magnesium: 2.1 mg/dL (ref 1.7–2.4)

## 2022-04-14 MED ORDER — ACETAMINOPHEN 325 MG PO TABS
650.0000 mg | ORAL_TABLET | Freq: Four times a day (QID) | ORAL | Status: DC | PRN
Start: 2022-04-14 — End: 2022-05-03
  Administered 2022-04-16 – 2022-04-30 (×8): 650 mg via ORAL
  Filled 2022-04-14 (×8): qty 2

## 2022-04-14 MED ORDER — SODIUM CHLORIDE 0.9 % IV BOLUS
500.0000 mL | Freq: Once | INTRAVENOUS | Status: AC
  Administered 2022-04-14: 500 mL via INTRAVENOUS

## 2022-04-14 MED ORDER — TAMOXIFEN CITRATE 10 MG PO TABS
20.0000 mg | ORAL_TABLET | Freq: Every day | ORAL | Status: DC
  Administered 2022-04-15 – 2022-05-03 (×19): 20 mg via ORAL
  Filled 2022-04-14 (×20): qty 2

## 2022-04-14 MED ORDER — PANTOPRAZOLE SODIUM 40 MG PO TBEC
40.0000 mg | DELAYED_RELEASE_TABLET | Freq: Every day | ORAL | Status: DC
  Administered 2022-04-15 – 2022-05-03 (×19): 40 mg via ORAL
  Filled 2022-04-14 (×19): qty 1

## 2022-04-14 MED ORDER — ACETAMINOPHEN 650 MG RE SUPP: 650.0000 mg | Freq: Four times a day (QID) | RECTAL | Status: DC | PRN

## 2022-04-14 MED ORDER — DICYCLOMINE HCL 20 MG PO TABS: 10.0000 mg | ORAL_TABLET | Freq: Four times a day (QID) | ORAL | Status: DC | PRN

## 2022-04-14 MED ORDER — ENOXAPARIN SODIUM 30 MG/0.3ML IJ SOSY
30.0000 mg | PREFILLED_SYRINGE | INTRAMUSCULAR | Status: DC
  Administered 2022-04-14 – 2022-05-02 (×19): 30 mg via SUBCUTANEOUS
  Filled 2022-04-14 (×19): qty 0.3

## 2022-04-14 MED ORDER — SENNOSIDES-DOCUSATE SODIUM 8.6-50 MG PO TABS
1.0000 | ORAL_TABLET | Freq: Every evening | ORAL | Status: DC | PRN
  Administered 2022-04-16: 1 via ORAL
  Filled 2022-04-14: qty 1

## 2022-04-14 MED ORDER — SODIUM ZIRCONIUM CYCLOSILICATE 10 G PO PACK
10.0000 g | PACK | Freq: Once | ORAL | Status: AC
Start: 2022-04-14 — End: 2022-04-14
  Administered 2022-04-14: 10 g via ORAL
  Filled 2022-04-14: qty 1

## 2022-04-14 MED ORDER — SODIUM CHLORIDE 0.9% FLUSH
3.0000 mL | Freq: Two times a day (BID) | INTRAVENOUS | Status: DC
  Administered 2022-04-14 – 2022-05-02 (×24): 3 mL via INTRAVENOUS

## 2022-04-14 MED ORDER — OXYCODONE HCL 5 MG PO TABS
5.0000 mg | ORAL_TABLET | ORAL | Status: DC | PRN
  Administered 2022-04-15 – 2022-04-22 (×11): 10 mg via ORAL
  Administered 2022-04-23 – 2022-04-24 (×5): 5 mg via ORAL
  Administered 2022-04-25: 10 mg via ORAL
  Administered 2022-04-27 – 2022-05-03 (×11): 5 mg via ORAL
  Filled 2022-04-14 (×3): qty 1
  Filled 2022-04-14 (×2): qty 2
  Filled 2022-04-14 (×2): qty 1
  Filled 2022-04-14 (×4): qty 2
  Filled 2022-04-14 (×2): qty 1
  Filled 2022-04-14 (×2): qty 2
  Filled 2022-04-14 (×3): qty 1
  Filled 2022-04-14: qty 2
  Filled 2022-04-14 (×4): qty 1
  Filled 2022-04-14: qty 2
  Filled 2022-04-14: qty 1
  Filled 2022-04-14: qty 2
  Filled 2022-04-14 (×3): qty 1

## 2022-04-14 MED ORDER — BACLOFEN 5 MG HALF TABLET: 5.0000 mg | ORAL_TABLET | Freq: Every day | ORAL | Status: DC | PRN

## 2022-04-14 NOTE — Telephone Encounter (Signed)
Spoke w/Martin in ED who took message for [employer] Nurse in [place] ED regarding pt coming in today d/t Na+ 119 today.  Informed Hassell Done that pt has a PleuRx drain that is drained daily with 1.5L being drained daily.  Pt has Metastatic Hepatocellular carcinoma.  Pt is SOB with severe cough d/t ascites.  Pt's diuretics are being held as of 04/08/2022 d/t increased fluid output in PleuRx.  [name] would like the pt to hydration and admitted to [facility] if possible. Lft message with Hassell Done for Chong Sicilian to call this RN back if she should have additional questions or concerns. ? ? ?Spoke with pt via telephone who confirmed voicemail message was received and will come to [facility] ED for further assessment and possible admission.   ?

## 2022-04-14 NOTE — ED Notes (Signed)
Lab reports they need a gold top, will collect when I am able too. ?

## 2022-04-14 NOTE — Telephone Encounter (Signed)
LVM on both pt and spouse's cell phones stating pt's Na+ is 119 today which is critically low and that [name] would like for the pt to come to the [facility] ED today for hydration and to be admitted if needed.  Asked if pt or spouse could please contact Dr. Ernestina Penna office to confirm receipt of the voicemail messages.  Sent a MyChart message as well with this information. ?

## 2022-04-14 NOTE — ED Notes (Signed)
Pt sent by Dr Annamaria Boots due to Liver cancer, Plural drain with large amounts of drainage. ?

## 2022-04-14 NOTE — H&P (Signed)
?History and Physical  ? ? ?Theresa Rocha Every HWE:[id number] DOB: 02/27/1966 DOA: 04/14/2022 ? ?PCP: Seward Carol, MD  ? ?Patient coming from: Home  ? ?Chief Complaint: low sodium on outpatient labs  ? ?HPI: Theresa Rocha is a pleasant 56 y.o. female with medical history significant for cancer of the right breast, hepatocellular carcinoma, cirrhosis, and recurrent left pleural effusion with PleurX catheter now presenting to the emergency department for evaluation of worsening hyponatremia.  Patient reports that she has been holding her diuretics since 04/08/2022, had outpatient blood work performed today, and was notified that her sodium had decreased to 119.  She was advised to seek further evaluation in the ED.  She has not noticed any acute change in her chronic fatigue and general weakness, denies headache, denies nausea or vomiting, and denies change in her chronic loose stools.  She denies any ankle swelling.  She sometimes develops abdominal distention but none now.  She reports worsening weight loss in the past few weeks.  Denies fevers or chills.  Denies abdominal pain. ? ?ED Course: Upon arrival to the ED, patient is found to be afebrile and saturating well on room air with systolic blood pressure in the 90s.  Chemistry panel notable for sodium of 124 and potassium 6.0.  Patient was given Lokelma and 500 cc normal saline bolus in the ED. ? ?Review of Systems:  ?All other systems reviewed and apart from HPI, are negative. ? ?Past Medical History:  ?Diagnosis Date  ? Anemia   ? Breast cancer ([place]) 08/30/2019  ? Cancer [facility])   ? liver- just diagnosed, not started treatment yet  ? Cervicalgia   ? COPD (chronic obstructive pulmonary disease) ([place])   ? Esophageal varices ([facility])   ? GERD (gastroesophageal reflux disease)   ? Headache   ? Hepatic cirrhosis ([place])   ? Hepatitis C   ? resolved 2018  ? Hepatocellular carcinoma ([place])   ? IBS (irritable bowel syndrome)   ? Lactose intolerance   ? Personal history of radiation  therapy 11/2019  ? Right breast  ? Portal hypertension ([place])   ? Raynaud's syndrome   ? Thrombocytopenia ([place])   ? ? ?Past Surgical History:  ?Procedure Laterality Date  ? BREAST BIOPSY Right 09/2019  ? BREAST LUMPECTOMY Right 09/2019  ? BREAST LUMPECTOMY WITH AXILLARY LYMPH NODE BIOPSY Right 10/16/2019  ? Procedure: RIGHT BREAST LUMPECTOMY WITH SENTINEL LYMPH NODE BIOPSY;  Surgeon: Stark Klein, MD;  Location: [facility];  Service: General;  Laterality: Right;  ? COLONOSCOPY    ? ESOPHAGEAL BANDING  10/27/2021  ? Procedure: ESOPHAGEAL BANDING;  Surgeon: Irene Shipper, MD;  Location: [facility];  Service: Endoscopy;;  ? ESOPHAGOGASTRODUODENOSCOPY (EGD) WITH PROPOFOL N/A 10/27/2021  ? Procedure: ESOPHAGOGASTRODUODENOSCOPY (EGD) WITH PROPOFOL;  Surgeon: Irene Shipper, MD;  Location: [facility];  Service: Endoscopy;  Laterality: N/A;  ? ESOPHAGOGASTRODUODENOSCOPY (EGD) WITH PROPOFOL N/A 01/04/2022  ? Procedure: ESOPHAGOGASTRODUODENOSCOPY (EGD) WITH PROPOFOL;  Surgeon: Irene Shipper, MD;  Location: [facility];  Service: Endoscopy;  Laterality: N/A;  ? HAMMER TOE SURGERY Left 2012  ? pins placed in great toe, 2nd,3rd, 4th toes, all pins removed except great toe  ? IR ANGIOGRAM SELECTIVE EACH ADDITIONAL VESSEL  12/16/2021  ? IR ANGIOGRAM SELECTIVE EACH ADDITIONAL VESSEL  12/16/2021  ? IR ANGIOGRAM SELECTIVE EACH ADDITIONAL VESSEL  12/22/2021  ? IR ANGIOGRAM VISCERAL SELECTIVE  12/16/2021  ? IR ANGIOGRAM VISCERAL SELECTIVE  12/22/2021  ? IR EMBO TUMOR ORGAN ISCHEMIA INFARCT INC GUIDE  ROADMAPPING  12/22/2021  ? IR PARACENTESIS  11/13/2019  ? IR PARACENTESIS  03/04/2022  ? IR PERC PLEURAL DRAIN W/INDWELL CATH W/IMG GUIDE  03/09/2022  ? IR RADIOLOGIST EVAL & MGMT  08/16/2019  ? IR RADIOLOGIST EVAL & MGMT  02/06/2020  ? IR RADIOLOGIST EVAL & MGMT  06/26/2020  ? IR RADIOLOGIST EVAL & MGMT  12/31/2020  ? IR RADIOLOGIST EVAL & MGMT  07/21/2021  ? IR RADIOLOGIST EVAL & MGMT  10/08/2021  ? IR RADIOLOGIST EVAL & MGMT  01/13/2022  ? IR RADIOLOGIST  EVAL & MGMT  03/05/2022  ? IR THORACENTESIS ASP PLEURAL SPACE W/IMG GUIDE  10/12/2021  ? IR THORACENTESIS ASP PLEURAL SPACE W/IMG GUIDE  10/26/2021  ? IR THORACENTESIS ASP PLEURAL SPACE W/IMG GUIDE  11/19/2021  ? IR THORACENTESIS ASP PLEURAL SPACE W/IMG GUIDE  12/22/2021  ? IR US GUIDE VASC ACCESS RIGHT  12/16/2021  ? IR US GUIDE VASC ACCESS RIGHT  12/22/2021  ? PELVIC LAPAROSCOPY Bilateral 1992  ? RADIOLOGY WITH ANESTHESIA N/A 09/16/2021  ? Procedure: CT MICROWAVE ABLATION;  Surgeon: Aletta Edouard, MD;  Location: [facility];  Service: Radiology;  Laterality: N/A;  ? UPPER GASTROINTESTINAL ENDOSCOPY  2002  ? had esophageal dilitation  ? ? ?Social History:  ? reports that she has been smoking cigarettes. She started smoking about 43 years ago. She has a 15.00 pack-year smoking history. She has never used smokeless tobacco. She reports that she does not currently use alcohol after a past usage of about 6.0 standard drinks per week. She reports that she does not use drugs. ? ?Allergies  ?Allergen Reactions  ? Hydromorphone Hcl Nausea And Vomiting  ?  Severe vomiting  ? Aspirin Nausea Only  ? ? ?Family History  ?Problem Relation Age of Onset  ? Liver cancer Mother   ? Lung cancer Mother   ? Lung cancer Father   ? Liver cancer Maternal Grandmother   ? Colon cancer Neg Hx   ? Esophageal cancer Neg Hx   ? Stomach cancer Neg Hx   ? Rectal cancer Neg Hx   ? ? ? ?Prior to Admission medications   ?Medication Sig Start Date End Date Taking? Authorizing Provider  ?baclofen (LIORESAL) 10 MG tablet Take 5 mg by mouth daily as needed for muscle spasms. 03/12/22  Yes [provider]  ?dicyclomine (BENTYL) 20 MG tablet Take 0.5 tablets (10 mg total) by mouth every 6 (six) hours as needed for spasms. Take one tablet every 6-8 hours as needed for abdominal cramping ?Patient taking differently: Take 20 mg by mouth daily as needed (abdominal cramping). 01/07/22  Yes Noralyn Pick, NP  ?loperamide (IMODIUM) 2 MG capsule Take 4  mg by mouth daily as needed for diarrhea or loose stools.   Yes [provider]  ?nicotine (NICODERM CQ - DOSED IN MG/24 HOURS) 21 mg/24hr patch Place 21 mg onto the skin daily as needed (smoking cessation on work days).   Yes [provider]  ?oxyCODONE (OXY IR/ROXICODONE) 5 MG immediate release tablet Take 1-2 tablets (5-10 mg total) by mouth every 4 (four) hours as needed for severe pain. 03/15/22  Yes Truitt Merle, MD  ?pantoprazole (PROTONIX) 40 MG tablet Take 1 tablet (40 mg total) by mouth daily. 12/01/21  Yes Noralyn Pick, NP  ?promethazine (PHENERGAN) 12.5 MG tablet Take 1 tablet (12.5 mg total) by mouth every 6 (six) hours as needed for nausea or vomiting. 03/10/22  Yes Han, Aimee H, PA-C  ?tamoxifen (NOLVADEX) 20  MG tablet TAKE 1 TABLET BY MOUTH EVERY DAY ?Patient taking differently: Take 20 mg by mouth every evening. 10/13/20  Yes Truitt Merle, MD  ?docusate sodium (COLACE) 100 MG capsule Take 1 capsule (100 mg total) by mouth 2 (two) times daily. ?Patient not taking: Reported on 04/14/2022 03/10/22   Han, Aimee H, PA-C  ?furosemide (LASIX) 40 MG tablet Take 1 tablet (40 mg total) by mouth daily. ?Patient not taking: Reported on 04/14/2022 02/23/22   Georgette Shell, MD  ?lidocaine (LIDODERM) 5 % Place 2 patches onto the skin daily. Remove & Discard patch within 12 hours or as directed by MD ?Patient not taking: Reported on 04/14/2022 02/23/22   Georgette Shell, MD  ?potassium chloride SA (KLOR-CON M) 20 MEQ tablet Take 1 tablet (20 mEq total) by mouth daily. ?Patient not taking: Reported on 04/14/2022 02/23/22   Georgette Shell, MD  ? ? ?Physical Exam: ?Vitals:  ? 04/14/22 1845 04/14/22 1859 04/14/22 1900 04/14/22 1915  ?BP: 101/67 1'01/67 95/68 96/71 '$  ?Pulse: (!) 101 (!) 101 (!) 105 99  ?Resp:  17    ?Temp:      ?TempSrc:      ?SpO2: 98% 99% 99% 99%  ?Weight:      ?Height:      ? ? ?Constitutional: NAD, calm, appears frail   ?Eyes: PERTLA, lids and conjunctivae normal ?ENMT:  Mucous membranes are moist. Posterior pharynx clear of any exudate or lesions.   ?Neck: supple, no masses  ?Respiratory:  no wheezing, no crackles. No accessory muscle use.  ?Cardiovascular: S1 & S2 heard

## 2022-04-14 NOTE — ED Triage Notes (Signed)
Pt states she had her blood drawn today and her sodium was 119. ?

## 2022-04-14 NOTE — ED Provider Notes (Signed)
[facility] DEPT Provider Note   CSN: [id number] Arrival date & time: 04/14/22  1535     History  Chief Complaint  Patient presents with   abnormal labs    Theresa Rocha is a 56 y.o. female.   Patient as above with significant medical history as below, including metastatic hepatocellular carcinoma with Pleurx catheter.  COPD, esophageal varices, hepatic cirrhosis, breast cancer  who presents to the ED with complaint of abnormal labs.  Patient was seen by oncology who sent to the ED given hyponatremia, [name] requesting admission and IV rehydration.  Pt reports she feels her right sided pleurx is draining well, she will drain at home qweekly. Was taken off diuretic 1 wk ago 2/2 over diuresis. She has not drained her catheter in around 1 week. She reports feeling of malaise/fatigue over the last 2 days, "feeling shakey." No n/v, no diarrhea, no change to urination. No diet changes. She has poor appetite at baseline but is able to tolerate po.   She is on eplerenone and lasix    Past Medical History: No date: Anemia 08/30/2019: Breast cancer ([place]) No date: Cancer ([place])     Comment:  liver- just diagnosed, not started treatment yet No date: Cervicalgia No date: COPD (chronic obstructive pulmonary disease) (HCC) No date: Esophageal varices (HCC) No date: GERD (gastroesophageal reflux disease) No date: Headache No date: Hepatic cirrhosis ([place]) No date: Hepatitis C     Comment:  resolved 2018 No date: Hepatocellular carcinoma ([place]) No date: IBS (irritable bowel syndrome) No date: Lactose intolerance 11/2019: Personal history of radiation therapy     Comment:  Right breast No date: Portal hypertension ([place]) No date: Raynaud's syndrome No date: Thrombocytopenia [facility])  Past Surgical History: 09/2019: BREAST BIOPSY; Right 09/2019: BREAST LUMPECTOMY; Right 10/16/2019: BREAST LUMPECTOMY WITH AXILLARY LYMPH NODE BIOPSY; Right     Comment:   Procedure: RIGHT BREAST LUMPECTOMY WITH SENTINEL LYMPH               NODE BIOPSY;  Surgeon: Stark Klein, MD;  Location: [facility];  Service: General;  Laterality: Right; No date: COLONOSCOPY 10/27/2021: ESOPHAGEAL BANDING     Comment:  Procedure: ESOPHAGEAL BANDING;  Surgeon: Irene Shipper,               MD;  Location: [facility];  Service: Endoscopy;; 10/27/2021: ESOPHAGOGASTRODUODENOSCOPY (EGD) WITH PROPOFOL; N/A     Comment:  Procedure: ESOPHAGOGASTRODUODENOSCOPY (EGD) WITH               PROPOFOL;  Surgeon: Irene Shipper, MD;  Location: [facility]               ENDOSCOPY;  Service: Endoscopy;  Laterality: N/A; 01/04/2022: ESOPHAGOGASTRODUODENOSCOPY (EGD) WITH PROPOFOL; N/A     Comment:  Procedure: ESOPHAGOGASTRODUODENOSCOPY (EGD) WITH               PROPOFOL;  Surgeon: Irene Shipper, MD;  Location: [facility]               ENDOSCOPY;  Service: Endoscopy;  Laterality: N/A; 2012: HAMMER TOE SURGERY; Left     Comment:  pins placed in great toe, 2nd,3rd, 4th toes, all pins               removed except great toe 12/16/2021: IR ANGIOGRAM SELECTIVE EACH ADDITIONAL VESSEL 12/16/2021: IR ANGIOGRAM SELECTIVE EACH ADDITIONAL VESSEL 12/22/2021: IR ANGIOGRAM SELECTIVE EACH ADDITIONAL  VESSEL 12/16/2021: IR ANGIOGRAM VISCERAL SELECTIVE 12/22/2021: IR ANGIOGRAM VISCERAL SELECTIVE 12/22/2021: IR EMBO TUMOR ORGAN ISCHEMIA INFARCT INC GUIDE ROADMAPPING 11/13/2019: IR PARACENTESIS 03/04/2022: IR PARACENTESIS 03/09/2022: IR PERC PLEURAL DRAIN W/INDWELL CATH W/IMG GUIDE 08/16/2019: IR RADIOLOGIST EVAL & MGMT 02/06/2020: IR RADIOLOGIST EVAL & MGMT 06/26/2020: IR RADIOLOGIST EVAL & MGMT 12/31/2020: IR RADIOLOGIST EVAL & MGMT 07/21/2021: IR RADIOLOGIST EVAL & MGMT 10/08/2021: IR RADIOLOGIST EVAL & MGMT 01/13/2022: IR RADIOLOGIST EVAL & MGMT 03/05/2022: IR RADIOLOGIST EVAL & MGMT 10/12/2021: IR THORACENTESIS ASP PLEURAL SPACE W/IMG GUIDE 10/26/2021: IR THORACENTESIS ASP PLEURAL SPACE W/IMG GUIDE 11/19/2021: IR THORACENTESIS  ASP PLEURAL SPACE W/IMG GUIDE 12/22/2021: IR THORACENTESIS ASP PLEURAL SPACE W/IMG GUIDE 12/16/2021: IR US GUIDE VASC ACCESS RIGHT 12/22/2021: IR US GUIDE VASC ACCESS RIGHT 1992: PELVIC LAPAROSCOPY; Bilateral 09/16/2021: RADIOLOGY WITH ANESTHESIA; N/A     Comment:  Procedure: CT MICROWAVE ABLATION;  Surgeon: Aletta Edouard, MD;  Location: [facility];  Service: Radiology;                Laterality: N/A; 2002: UPPER GASTROINTESTINAL ENDOSCOPY     Comment:  had esophageal dilitation    The history is provided by the patient. No language interpreter was used.      Home Medications Prior to Admission medications   Medication Sig Start Date End Date Taking? Authorizing Provider  baclofen (LIORESAL) 10 MG tablet Take 5 mg by mouth daily as needed for muscle spasms. 03/12/22  Yes [provider]  dicyclomine (BENTYL) 20 MG tablet Take 0.5 tablets (10 mg total) by mouth every 6 (six) hours as needed for spasms. Take one tablet every 6-8 hours as needed for abdominal cramping Patient taking differently: Take 20 mg by mouth daily as needed (abdominal cramping). 01/07/22  Yes Noralyn Pick, NP  loperamide (IMODIUM) 2 MG capsule Take 4 mg by mouth daily as needed for diarrhea or loose stools.   Yes [provider]  nicotine (NICODERM CQ - DOSED IN MG/24 HOURS) 21 mg/24hr patch Place 21 mg onto the skin daily as needed (smoking cessation on work days).   Yes [provider]  oxyCODONE (OXY IR/ROXICODONE) 5 MG immediate release tablet Take 1-2 tablets (5-10 mg total) by mouth every 4 (four) hours as needed for severe pain. 03/15/22  Yes Truitt Merle, MD  pantoprazole (PROTONIX) 40 MG tablet Take 1 tablet (40 mg total) by mouth daily. 12/01/21  Yes Noralyn Pick, NP  promethazine (PHENERGAN) 12.5 MG tablet Take 1 tablet (12.5 mg total) by mouth every 6 (six) hours as needed for nausea or vomiting. 03/10/22  Yes Han, Aimee H, PA-C  tamoxifen (NOLVADEX) 20 MG  tablet TAKE 1 TABLET BY MOUTH EVERY DAY Patient taking differently: Take 20 mg by mouth every evening. 10/13/20  Yes Truitt Merle, MD  docusate sodium (COLACE) 100 MG capsule Take 1 capsule (100 mg total) by mouth 2 (two) times daily. Patient not taking: Reported on 04/14/2022 03/10/22   Han, Aimee H, PA-C  furosemide (LASIX) 40 MG tablet Take 1 tablet (40 mg total) by mouth daily. Patient not taking: Reported on 04/14/2022 02/23/22   Georgette Shell, MD  lidocaine (LIDODERM) 5 % Place 2 patches onto the skin daily. Remove & Discard patch within 12 hours or as directed by MD Patient not taking: Reported on 04/14/2022 02/23/22   Georgette Shell, MD  potassium chloride SA (KLOR-CON M) 20 MEQ tablet Take 1 tablet (  20 mEq total) by mouth daily. Patient not taking: Reported on 04/14/2022 02/23/22   Georgette Shell, MD      Allergies    Hydromorphone hcl and Aspirin    Review of Systems   Review of Systems  Constitutional:  Positive for fatigue. Negative for chills and fever.  HENT:  Negative for facial swelling and trouble swallowing.   Eyes:  Negative for photophobia and visual disturbance.  Respiratory:  Negative for cough and shortness of breath.   Cardiovascular:  Negative for chest pain and palpitations.  Gastrointestinal:  Negative for abdominal pain, nausea and vomiting.  Endocrine: Negative for polydipsia and polyuria.  Genitourinary:  Negative for difficulty urinating and hematuria.  Musculoskeletal:  Negative for gait problem and joint swelling.  Skin:  Negative for pallor and rash.  Neurological:  Negative for syncope and headaches.  Psychiatric/Behavioral:  Negative for agitation and confusion.    Physical Exam Updated Vital Signs BP (!) 85/61   Pulse 99   Temp 98.3 F (36.8 C) (Oral)   Resp 16   Ht '5\' 3"'$  (1.6 m)   Wt 39.9 kg   LMP 04/20/2013   SpO2 94%   BMI 15.59 kg/m  Physical Exam Vitals and nursing note reviewed.  Constitutional:      General: She is not  in acute distress.    Appearance: Normal appearance.  HENT:     Head: Normocephalic and atraumatic.     Right Ear: External ear normal.     Left Ear: External ear normal.     Nose: Nose normal.     Mouth/Throat:     Mouth: Mucous membranes are moist.  Eyes:     General: No scleral icterus.       Right eye: No discharge.        Left eye: No discharge.  Cardiovascular:     Rate and Rhythm: Normal rate and regular rhythm.     Pulses: Normal pulses.     Heart sounds: Normal heart sounds.  Pulmonary:     Effort: Pulmonary effort is normal. No respiratory distress.     Breath sounds: Normal breath sounds.     Comments: Decreased breath sounds left  Chest:    Abdominal:     General: Abdomen is flat.     Palpations: Abdomen is soft.     Tenderness: There is no abdominal tenderness.  Musculoskeletal:        General: Normal range of motion.     Cervical back: Normal range of motion.     Right lower leg: No edema.     Left lower leg: No edema.  Skin:    General: Skin is warm and dry.     Capillary Refill: Capillary refill takes less than 2 seconds.  Neurological:     Mental Status: She is alert.  Psychiatric:        Mood and Affect: Mood normal.        Behavior: Behavior normal.    ED Results / Procedures / Treatments   Labs (all labs ordered are listed, but only abnormal results are displayed) Labs Reviewed  CBC WITH DIFFERENTIAL/PLATELET - Abnormal; Notable for the following components:      Result Value   MCV 77.7 (*)    MCH 25.1 (*)    RDW 20.9 (*)    Platelets 125 (*)    Monocytes Absolute 1.6 (*)    Abs Immature Granulocytes 0.08 (*)    All other components within normal limits  COMPREHENSIVE METABOLIC PANEL - Abnormal; Notable for the following components:   Sodium 124 (*)    Potassium 6.0 (*)    Chloride 97 (*)    CO2 21 (*)    Calcium 8.4 (*)    Total Protein 6.3 (*)    Albumin 2.6 (*)    AST 61 (*)    Total Bilirubin 2.1 (*)    All other components  within normal limits  LIPASE, BLOOD - Abnormal; Notable for the following components:   Lipase 71 (*)    All other components within normal limits  URINALYSIS, ROUTINE W REFLEX MICROSCOPIC - Abnormal; Notable for the following components:   Color, Urine AMBER (*)    APPearance HAZY (*)    Ketones, ur 5 (*)    All other components within normal limits  OSMOLALITY, URINE - Abnormal; Notable for the following components:   Osmolality, Ur 924 (*)    All other components within normal limits  MAGNESIUM  NA AND K (SODIUM & POTASSIUM), RAND UR  CREATININE, URINE, RANDOM  UREA NITROGEN, URINE  URIC ACID, RANDOM URINE  COMPREHENSIVE METABOLIC PANEL  CBC  OSMOLALITY  PHOSPHORUS    EKG None  Radiology DG Chest Portable 1 View  Result Date: 04/14/2022 CLINICAL DATA:  Pleural effusion, pleural drain with large amounts of drainage history of liver tumor. EXAM: PORTABLE CHEST 1 VIEW COMPARISON:  Apr 08, 2022. FINDINGS: Trachea midline. Cardiomediastinal contours and hilar structures are stable. No visible pneumothorax with pleural drain in stable position, tip at the LEFT lung apex. Similar volume of pleural fluid when compared to the study of Apr 08, 2022. Surgical clips project over the RIGHT chest and axilla. On limited assessment there is no acute skeletal process. IMPRESSION: Stable chest x-ray. Pleural drain remains in place with moderate LEFT-sided pleural effusion. Electronically Signed   By: Zetta Bills M.D.   On: 04/14/2022 17:17    Procedures .Critical Care Performed by: Jeanell Sparrow, DO Authorized by: Jeanell Sparrow, DO   Critical care provider statement:    Critical care time (minutes):  44   Critical care time was exclusive of:  Separately billable procedures and treating other patients   Critical care was necessary to treat or prevent imminent or life-threatening deterioration of the following conditions:  Endocrine crisis   Critical care was time spent personally by me  on the following activities:  Development of treatment plan with patient or surrogate, discussions with consultants, evaluation of patient's response to treatment, examination of patient, ordering and review of laboratory studies, ordering and review of radiographic studies, ordering and performing treatments and interventions, pulse oximetry, re-evaluation of patient's condition, review of old charts and obtaining history from patient or surrogate   Care discussed with: admitting provider      Medications Ordered in ED Medications  oxyCODONE (Oxy IR/ROXICODONE) immediate release tablet 5-10 mg (has no administration in time range)  tamoxifen (NOLVADEX) tablet 20 mg (has no administration in time range)  dicyclomine (BENTYL) tablet 10 mg (has no administration in time range)  pantoprazole (PROTONIX) EC tablet 40 mg (has no administration in time range)  baclofen (LIORESAL) tablet 5 mg (has no administration in time range)  enoxaparin (LOVENOX) injection 30 mg (30 mg Subcutaneous Given 04/14/22 2242)  sodium chloride flush (NS) 0.9 % injection 3 mL (3 mLs Intravenous Given by Other 04/14/22 2242)  acetaminophen (TYLENOL) tablet 650 mg (has no administration in time range)    Or  acetaminophen (TYLENOL) suppository 650 mg (  has no administration in time range)  senna-docusate (Senokot-S) tablet 1 tablet (has no administration in time range)  sodium chloride 0.9 % bolus 500 mL (0 mLs Intravenous Stopped 04/14/22 1922)  sodium zirconium cyclosilicate (LOKELMA) packet 10 g (10 g Oral Given 04/14/22 1833)    ED Course/ Medical Decision Making/ A&P                           Medical Decision Making Amount and/or Complexity of Data Reviewed Labs: ordered. Radiology: ordered.  Risk Prescription drug management. Decision regarding hospitalization.    CC: Hyponatremia  This patient presents to the Emergency Department for the above complaint. This involves an extensive number of treatment options  and is a complaint that carries with it a high risk of complications and morbidity. Vital signs were reviewed. Serious etiologies considered.  DDx includes but limited to diuretic induced, hypovolemia, AKI, renal failure, dehydration, SIADH, polydipsia, other acute etiologies  Record review:  Previous records obtained and reviewed  Recent oncology office notes, prior labs and imaging, prior consult notes  Additional history obtained from N/A  Medical and surgical history as noted above.   Work up as above, notable for:  Labs & imaging results that were available during my care of the patient were visualized by me and considered in my medical decision making.   I ordered imaging studies which included chest x-ray and I visualized the imaging and I agree with radiologist interpretation.  Left-sided pleural effusion  Cardiac monitoring reviewed and interpreted personally which shows NSR  Labs reviewed, she has sodium 124, potassium 6.0.  Creatinine 0.54.  Mag stable.  Urine pending, OSM pending.   Management: Give half liter NS Give Lokelma  Reassessment:  Patient has no acute complaints at this time.  She is tolerating IV fluids appropriately.  Agreeable with plan for admission.  Given the hyponatremia in setting of diuretic use, hyperkalemia - would recommend admission. Recommend continued surveillance of electrolyte levels to avoid rapid or over correction.   Patient is agreeable for admission Would be reasonable to have [name] on consult  Spoke with Dr Myna Hidalgo who accepts pt for admission.                 Social determinants of health include -  Social History   Socioeconomic History   Marital status: Married    Spouse name: Not on file   Number of children: Not on file   Years of education: Not on file   Highest education level: Not on file  Occupational History   Not on file  Tobacco Use   Smoking status: Every Day    Packs/day: 0.50    Years: 30.00     Pack years: 15.00    Types: Cigarettes    Start date: 11/29/1978   Smokeless tobacco: Never  Vaping Use   Vaping Use: Never used  Substance and Sexual Activity   Alcohol use: Not Currently    Alcohol/week: 6.0 standard drinks    Types: 6 Cans of beer per week    Comment: stopped 06/2019   Drug use: No   Sexual activity: Not Currently    Partners: Male  Other Topics Concern   Not on file  Social History Narrative   Not on file   Social Determinants of Health   Financial Resource Strain: Not on file  Food Insecurity: Not on file  Transportation Needs: Not on file  Physical Activity: Not on  file  Stress: Not on file  Social Connections: Not on file  Intimate Partner Violence: Not on file      This chart was dictated using voice recognition software.  Despite best efforts to proofread,  errors can occur which can change the documentation meaning.         Final Clinical Impression(s) / ED Diagnoses Final diagnoses:  Hyponatremia  Hepatocellular carcinoma ([place])  Hyperkalemia    Rx / DC Orders ED Discharge Orders     None         Jeanell Sparrow, DO 04/15/22 3354

## 2022-04-14 NOTE — Telephone Encounter (Signed)
CRITICAL VALUE STICKER ? ?CRITICAL VALUE:  Sodium 119 ? ?RECEIVER (on-site recipient of call): Samayra Hebel  ? ?DATE & TIME NOTIFIED: 04/14/2022 1:35pm ? ?MESSENGER (representative from lab): Mickel Baas  ? ?MD NOTIFIED: [name] ? ?  ?

## 2022-04-15 DIAGNOSIS — R188 Other ascites: Secondary | ICD-10-CM | POA: Diagnosis present

## 2022-04-15 DIAGNOSIS — B1920 Unspecified viral hepatitis C without hepatic coma: Secondary | ICD-10-CM | POA: Diagnosis not present

## 2022-04-15 DIAGNOSIS — D6959 Other secondary thrombocytopenia: Secondary | ICD-10-CM | POA: Diagnosis present

## 2022-04-15 DIAGNOSIS — K767 Hepatorenal syndrome: Secondary | ICD-10-CM | POA: Diagnosis not present

## 2022-04-15 DIAGNOSIS — K766 Portal hypertension: Secondary | ICD-10-CM | POA: Diagnosis present

## 2022-04-15 DIAGNOSIS — K219 Gastro-esophageal reflux disease without esophagitis: Secondary | ICD-10-CM | POA: Diagnosis present

## 2022-04-15 DIAGNOSIS — F1021 Alcohol dependence, in remission: Secondary | ICD-10-CM | POA: Diagnosis present

## 2022-04-15 DIAGNOSIS — E875 Hyperkalemia: Secondary | ICD-10-CM | POA: Diagnosis present

## 2022-04-15 DIAGNOSIS — K7469 Other cirrhosis of liver: Secondary | ICD-10-CM | POA: Diagnosis not present

## 2022-04-15 DIAGNOSIS — I8510 Secondary esophageal varices without bleeding: Secondary | ICD-10-CM | POA: Diagnosis present

## 2022-04-15 DIAGNOSIS — E43 Unspecified severe protein-calorie malnutrition: Secondary | ICD-10-CM | POA: Diagnosis present

## 2022-04-15 DIAGNOSIS — K7460 Unspecified cirrhosis of liver: Secondary | ICD-10-CM | POA: Diagnosis present

## 2022-04-15 DIAGNOSIS — E8809 Other disorders of plasma-protein metabolism, not elsewhere classified: Secondary | ICD-10-CM | POA: Diagnosis present

## 2022-04-15 DIAGNOSIS — Z681 Body mass index (BMI) 19 or less, adult: Secondary | ICD-10-CM | POA: Diagnosis not present

## 2022-04-15 DIAGNOSIS — N179 Acute kidney failure, unspecified: Secondary | ICD-10-CM | POA: Diagnosis not present

## 2022-04-15 DIAGNOSIS — J918 Pleural effusion in other conditions classified elsewhere: Secondary | ICD-10-CM | POA: Diagnosis present

## 2022-04-15 DIAGNOSIS — J9621 Acute and chronic respiratory failure with hypoxia: Secondary | ICD-10-CM | POA: Diagnosis not present

## 2022-04-15 DIAGNOSIS — D638 Anemia in other chronic diseases classified elsewhere: Secondary | ICD-10-CM | POA: Diagnosis present

## 2022-04-15 DIAGNOSIS — Z20822 Contact with and (suspected) exposure to covid-19: Secondary | ICD-10-CM | POA: Diagnosis present

## 2022-04-15 DIAGNOSIS — E861 Hypovolemia: Secondary | ICD-10-CM | POA: Diagnosis present

## 2022-04-15 DIAGNOSIS — R6510 Systemic inflammatory response syndrome (SIRS) of non-infectious origin without acute organ dysfunction: Secondary | ICD-10-CM | POA: Diagnosis not present

## 2022-04-15 DIAGNOSIS — C220 Liver cell carcinoma: Secondary | ICD-10-CM | POA: Diagnosis present

## 2022-04-15 DIAGNOSIS — E871 Hypo-osmolality and hyponatremia: Secondary | ICD-10-CM | POA: Diagnosis present

## 2022-04-15 DIAGNOSIS — K589 Irritable bowel syndrome without diarrhea: Secondary | ICD-10-CM | POA: Diagnosis present

## 2022-04-15 DIAGNOSIS — I7300 Raynaud's syndrome without gangrene: Secondary | ICD-10-CM | POA: Diagnosis present

## 2022-04-15 DIAGNOSIS — J449 Chronic obstructive pulmonary disease, unspecified: Secondary | ICD-10-CM | POA: Diagnosis present

## 2022-04-15 DIAGNOSIS — J90 Pleural effusion, not elsewhere classified: Secondary | ICD-10-CM

## 2022-04-15 LAB — CORTISOL: Cortisol, Plasma: 10.2 ug/dL

## 2022-04-15 LAB — BASIC METABOLIC PANEL
Anion gap: 7 (ref 5–15)
Anion gap: 6 (ref 5–15)
BUN: 12 mg/dL (ref 6–20)
BUN: 12 mg/dL (ref 6–20)
CO2: 20 mmol/L — ABNORMAL LOW (ref 22–32)
CO2: 20 mmol/L — ABNORMAL LOW (ref 22–32)
Calcium: 7.8 mg/dL — ABNORMAL LOW (ref 8.9–10.3)
Calcium: 7.9 mg/dL — ABNORMAL LOW (ref 8.9–10.3)
Chloride: 97 mmol/L — ABNORMAL LOW (ref 98–111)
Chloride: 101 mmol/L (ref 98–111)
Creatinine, Ser: 0.67 mg/dL (ref 0.44–1.00)
Creatinine, Ser: 0.69 mg/dL (ref 0.44–1.00)
GFR, Estimated: >60 mL/min (ref >60)
GFR, Estimated: >60 mL/min (ref >60)
Glucose, Bld: 86 mg/dL (ref 70–99)
Glucose, Bld: 94 mg/dL (ref 70–99)
Potassium: 3.8 mmol/L (ref 3.5–5.1)
Potassium: 4.6 mmol/L (ref 3.5–5.1)
Sodium: 124 mmol/L — ABNORMAL LOW (ref 135–145)
Sodium: 127 mmol/L — ABNORMAL LOW (ref 135–145)

## 2022-04-15 LAB — COMPREHENSIVE METABOLIC PANEL
ALT: 28 U/L (ref 0–44)
AST: 50 U/L — ABNORMAL HIGH (ref 15–41)
Albumin: 2.2 g/dL — ABNORMAL LOW (ref 3.5–5.0)
Alkaline Phosphatase: 78 U/L (ref 38–126)
Anion gap: 5 (ref 5–15)
BUN: 14 mg/dL (ref 6–20)
CO2: 20 mmol/L — ABNORMAL LOW (ref 22–32)
Calcium: 7.7 mg/dL — ABNORMAL LOW (ref 8.9–10.3)
Chloride: 98 mmol/L (ref 98–111)
Creatinine, Ser: 0.53 mg/dL (ref 0.44–1.00)
GFR, Estimated: >60 mL/min (ref >60)
Glucose, Bld: 118 mg/dL — ABNORMAL HIGH (ref 70–99)
Potassium: 4.6 mmol/L (ref 3.5–5.1)
Sodium: 123 mmol/L — ABNORMAL LOW (ref 135–145)
Total Bilirubin: 1.9 mg/dL — ABNORMAL HIGH (ref 0.3–1.2)
Total Protein: 5.3 g/dL — ABNORMAL LOW (ref 6.5–8.1)

## 2022-04-15 LAB — CBC
HCT: 35 % — ABNORMAL LOW (ref 36.0–46.0)
Hemoglobin: 11 g/dL — ABNORMAL LOW (ref 12.0–15.0)
MCH: 24.8 pg — ABNORMAL LOW (ref 26.0–34.0)
MCHC: 31.4 g/dL (ref 30.0–36.0)
MCV: 78.8 fL — ABNORMAL LOW (ref 80.0–100.0)
Platelets: 104 10*3/uL — ABNORMAL LOW (ref 150–400)
RBC: 4.44 MIL/uL (ref 3.87–5.11)
RDW: 20.4 % — ABNORMAL HIGH (ref 11.5–15.5)
WBC: 7.7 10*3/uL (ref 4.0–10.5)
nRBC: 0 % (ref 0.0–0.2)

## 2022-04-15 LAB — PHOSPHORUS: Phosphorus: 3 mg/dL (ref 2.5–4.6)

## 2022-04-15 LAB — AFP TUMOR MARKER: AFP, Serum, Tumor Marker: 179 ng/mL — ABNORMAL HIGH (ref 0.0–9.2)

## 2022-04-15 LAB — OSMOLALITY: Osmolality: 260 mOsm/kg — ABNORMAL LOW (ref 275–295)

## 2022-04-15 MED ORDER — ALBUMIN HUMAN 25 % IV SOLN
25.0000 g | Freq: Four times a day (QID) | INTRAVENOUS | Status: AC
  Administered 2022-04-15 (×3): 25 g via INTRAVENOUS
  Filled 2022-04-15 (×3): qty 100

## 2022-04-15 MED ORDER — NICOTINE 21 MG/24HR TD PT24
21.0000 mg | MEDICATED_PATCH | Freq: Every day | TRANSDERMAL | Status: DC
Start: 2022-04-15 — End: 2022-05-03
  Administered 2022-04-15 – 2022-05-03 (×19): 21 mg via TRANSDERMAL
  Filled 2022-04-15 (×19): qty 1

## 2022-04-15 MED ORDER — ONDANSETRON 4 MG PO TBDP
4.0000 mg | ORAL_TABLET | Freq: Once | ORAL | Status: AC
  Administered 2022-04-15: 4 mg via ORAL
  Filled 2022-04-15: qty 1

## 2022-04-15 MED ORDER — MIDODRINE HCL 5 MG PO TABS
5.0000 mg | ORAL_TABLET | Freq: Three times a day (TID) | ORAL | Status: DC
  Administered 2022-04-15 (×2): 5 mg via ORAL
  Filled 2022-04-15 (×2): qty 1

## 2022-04-15 MED ORDER — COSYNTROPIN 0.25 MG IJ SOLR
0.2500 mg | Freq: Once | INTRAMUSCULAR | Status: AC
Start: 2022-04-16 — End: 2022-04-16
  Administered 2022-04-16: 0.25 mg via INTRAVENOUS
  Filled 2022-04-15 (×2): qty 0.25

## 2022-04-15 MED ORDER — SODIUM CHLORIDE 0.9 % IV SOLN
INTRAVENOUS | Status: DC
Start: 2022-04-15 — End: 2022-04-16

## 2022-04-15 MED ORDER — NICOTINE POLACRILEX 2 MG MT GUM: 2.0000 mg | CHEWING_GUM | OROMUCOSAL | Status: DC | PRN

## 2022-04-15 NOTE — Assessment & Plan Note (Addendum)
Related to cirrhosis and suspected hypovolemia S/p albumin IVF on hold - watch with diuresis today midodrine

## 2022-04-15 NOTE — Progress Notes (Signed)
MD updated regarding Patient's Blood Pressure 85/59 Pulse 59. Pt's assessment otherwise without acute changes noted and Patient voices no complaints. New orders placed and maintain current plan of care

## 2022-04-15 NOTE — Assessment & Plan Note (Addendum)
Follows with [name]

## 2022-04-15 NOTE — Assessment & Plan Note (Addendum)
Per Dr. 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 had reached out to CT surgery regarding possibility of pleurodesis (appt scheduled 5/26) Echo 10/2021 with normal EF, normal diastolic parameters With worsening resp status, will plan to drain pleurx daily  Follow pending cytology, gram stain/culture - no growth, cell count - 32% neutrophils, LDH - transudate, no fluid protein done. Will continue to discuss with CT surgery plans regarding potential procedure once she's more stable - will go ahead and transfer to [facility] for [facility] surgery eval

## 2022-04-15 NOTE — Assessment & Plan Note (Signed)
Related to hepatitis C and etoh use disorder  Complicated by esophageal varices, portal hypertensive gastropathy, and progressive multifocal HCC s/p microwave ablation 08/2021, s/p [id number] radioembolization to R lobe of liver on 1/24 Appreciate GI assistance, follows with Roosevelt Locks NP at [facility]

## 2022-04-15 NOTE — Progress Notes (Addendum)
HEMATOLOGY-ONCOLOGY PROGRESS NOTE  ASSESSMENT AND PLAN: 1.  Hyperkalemia 2.  Hyponatremia 3.  Cirrhosis 4.  Pleural effusion, Pleurx catheter in place 4.  Thrombocytopenia 5.  Mild anemia 6.  Multifocal HCC 7.  Stage II right breast cancer  -Labs reviewed.  Potassium has now normalized.  Her sodium remains low at 123. -Has been off all diuretics for about 1 week now. -Will ask GI to see due to underlying history of cirrhosis. -Has been draining Pleurx catheter daily with 1 L of fluid removed.  Continue to drain Pleurx but may need to decrease frequency due to risk of hypovolemia.  -Status post Y90 for her [facility] and had recent MRI which shows improved/stable disease.  We will continue outpatient follow-up. -Currently on tamoxifen for breast cancer.  Continue.  Mikey Bussing, DNP, AGPCNP-BC, AOCNP  SUBJECTIVE: The patient is followed by our office for [facility] breast cancer.  Status post Y90 for her [place]. Currently on antiestrogen therapy.  Admitted with hyponatremia, hyperkalemia, hypovolemia.  Sodium was 124 on admission.  Received Lokelma for hyperkalemia.  The patient tells me that she has been off diuretics for about a week now.  She has been draining 1 L of fluid from her Pleurx catheter daily.  Denies nausea, vomiting.  Reports intermittent diarrhea which is consistent with her baseline since she has some IBS symptoms.  No pain reported today.  No bleeding.  Blood pressure is currently low and she is receiving an albumin infusion.  Denies dizziness and lightheadedness.  Oncology History Overview Note  Cancer Staging Hepatocellular carcinoma [facility]) Staging form: Liver, AJCC 8th Edition - Clinical stage from 08/02/2019: Stage II (cT2, cN0, cM0) - Signed by Truitt Merle, MD on 08/02/2019  Malignant neoplasm of central portion of right breast in female, estrogen receptor positive ([place]) Staging form: Breast, AJCC 8th Edition - Clinical stage from 08/29/2019: Stage IB (cT2, cN0, cM0, G2, ER+, PR+,  HER2-) - Signed by Truitt Merle, MD on 09/05/2019    Hepatocellular carcinoma ([place])  07/04/2019 Imaging   CT AP IMPRESSION: 1. Findings of cirrhosis and hepatomegaly.  Patent portal vein. 2. Numerous varicosities as described above. 3. Moderate to large amount of abdominopelvic ascites 4. Contrast filled appendix which appears to be a mildly dilated with apparent wall thickening which could be due to surrounding ascites.   07/09/2019 Imaging   ABD US IMPRESSION: 1. Three small hypoechoic lesions in the RIGHT hepatic lobe measuring less than 1 cm but not evident on comparison contrast enhanced CT from 07/04/2019. Recommend MRI of the abdomen without and with contrast to evaluate these hepatic lesions in cirrhotic patient. 2. Increased liver echogenicity and lobular contour consistent cirrhosis. 3. Moderate mild to moderate volume of ascites. 4. Mild gallbladder wall thickening likely related to ascites. 5. No cholelithiasis or cholecystitis evident   07/26/2019 Imaging   MR ABD IMPRESSION: 1. In the lateral segment left hepatic lobe there are two LI-RADS category LR-5 lesions representing hepatocellular carcinoma. 2. There is also a LI-RADS category LR-3 lesion peripherally in the right hepatic lobe, intermediate probability for hepatocellular carcinoma. 3. Hepatic cirrhosis and diffuse wall thickening of the gallbladder. Widespread steatosis in the liver. 4. 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 5.  Aortic Atherosclerosis (ICD10-I70.0). 6. Widespread ascites and mesenteric edema.   07/26/2019 Tumor Marker   AFP 5.5 (baseline)   08/02/2019 Initial Diagnosis   Hepatocellular carcinoma ([place])   08/02/2019 Cancer Staging   Staging form: Liver, AJCC 8th Edition - Clinical stage from 08/02/2019: Stage  II (cT2, cN0, cM0) - Signed by Truitt Merle, MD on 08/02/2019    08/07/2019 Imaging   CT Chest 08/07/19 IMPRESSION: 1. Tiny bilateral pulmonary nodules measuring up to 6  mm. These are too small to definitively characterize. Given the history of HCC, close follow-up recommended to ensure stability. 2.  Emphysema. (ICD10-J43.9)   08/09/2019 Procedure   Upper Endoscopy by Dr Henrene Pastor 08/09/19  IMPRESSION 1. Small esophageal varices 2. Mild diffuse portal hypertensive gastropathy 3. Otherwise normal EGD.   02/04/2020 Imaging   MRI abdomen  IMPRESSION: Stable exam. The small segment II lesions identified previously as LI-RADS category 5 are stable in size.   Previously characterized tiny LI-RADS 3 lesion in the inferior right liver is stable.   No new suspicious focal hepatic lesion on a background liver morphology consistent with cirrhosis.   Interval decrease in ascites with decrease and diffuse gallbladder wall thickening.     06/18/2020 Imaging   MRI Abdomen  IMPRESSION: 1. Stable 1.1 x 0.9 cm rounded lesion in the medial left lobe of the liver, hepatic segment II, demonstrating arterial phase hyperenhancement without washout or capsule. This has previously been described as a LI-RADS category 5 lesion based on prior observations, if categorized by current imaging features this is consistent with LI-RADS category 4. 2. Stable 1.5 x 1.2 cm lesion lateral and inferior to this lesion in hepatic segment II with very subtle evidence of washout but without capsular enhancement. LI-RADS category 5. 3. Stable 5 mm focus of early arterial phase hyperenhancement of the inferior right lobe of the liver, hepatic segment VI, without evidence of washout or capsule. This remains consistent with LI-RADS category 3. 4. No new suspicious lesions or contrast enhancement. 5. Cirrhotic morphology of the liver. 6. Trace perihepatic ascites.     10/30/2021 Imaging   EXAM: MRI ABDOMEN WITHOUT AND WITH CONTRAST  IMPRESSION: 1. Ablation defect of the left lobe of the liver which subtends two lesions previously noted in hepatic segment II. No evidence of residual  contrast enhancement to suggest viable tumor.  2. Significant interval increase in size of a hyperenhancing lesion of the posterior liver dome, hepatic segment VII, measuring 4.0 x 3.7 cm, previously 2.6 x 2.5 cm when measured similarly. This demonstrates some heterogeneous contrast enhancement although no clear evidence of washout or capsular enhancement. Findings remain LI-RADS category 5, consistent with hepatocellular carcinoma. 3. Slight interval increase in size of a hyperenhancing lesion of the inferior tip of the right lobe of the liver, hepatic segment VI, measuring 1.1 x 0.7 cm, previously no greater than 0.6 cm, as well as an additional adjacent lesion measuring 0.6 cm, previously 0.4 cm. Given threshold growth, these lesions are upstaged to LI-RADS category 4 and suspicious for additional small foci of hepatocellular carcinoma. 4. Cirrhotic morphology of the liver and hepatic steatosis. 5. Small volume ascites throughout the abdomen. 6. Large left, small right pleural effusions and associated atelectasis or consolidation. 7. Layering sludge in the gallbladder with mild gallbladder wall thickening and pericholecystic fluid, similar to prior examination although nonspecific in the setting of ascites. No discrete gallstones.   11/10/2021 Imaging   EXAM: CT ABDOMEN WITHOUT AND WITH CONTRAST  IMPRESSION: Post treatment changes about the LEFT hepatic lobe lesion with variable arterial enhancement not showing washout and with non masslike features, favor perfusional defect, characterized as TR equivocal but favor nonviable.   Lesion in the posterior RIGHT hepatic lobe over time with enlargement and with serpiginous internal low signal on MR and  low attenuation on CT. Findings categorized as LR M given signs of rim like enhancement on previous imaging and infiltrative appearance. Potentially infiltrative HCC but without specific imaging features of hepatocellular carcinoma by LI-RADS.    Enlarging lesion in the inferior RIGHT hepatic lobe greater than 50% diameter increase over 6 month interval, LI-RADS category 5 showing non rim arterial phase enhancement.   Additional foci of arterial phase enhancement without definitive washout, categorized as LI-RADS category 3 but in aggregate are more suspicious for small foci of hepatocellular carcinoma, attention on follow-up.   11/19/2021 Pathology Results   A. PLEURAL FLUID, LEFT, THORACENTESIS:   FINAL MICROSCOPIC DIAGNOSIS:  - No malignant cells identified  - Reactive mesothelial cells present    12/15/2021 PET scan   IMPRESSION: 1. Intensely hypermetabolic mass in the posterior RIGHT hepatic lobe consistent with known hepatocellular carcinoma. Smaller lesions identified on MRI are not hypermetabolic. 2. No evidence of metastatic lymphadenopathy or distant metastatic disease. 3. Elongated nodular thickening in the lingula is favored benign. Moderate LEFT pleural effusion.   Malignant neoplasm of central portion of right breast in female, estrogen receptor positive ([place])  08/29/2019 Cancer Staging   Staging form: Breast, AJCC 8th Edition - Clinical stage from 08/29/2019: Stage IB (cT2, cN0, cM0, G2, ER+, PR+, HER2-) - Signed by Truitt Merle, MD on 09/05/2019    08/29/2019 Mammogram   Diagnostic mammogram 08/29/19 IMPRESSION: Suspicious mass in the 12 o'clock region of the right breast 3 cm from the nipple measuring 3.2 x 1.6 x 2.6 cm   08/29/2019 Initial Biopsy   Diagnosis 08/29/19 Breast, right, needle core biopsy, 12 o'clock - INVASIVE DUCTAL CARCINOMA, SEE COMMENT.   08/29/2019 Receptors her2   Results: IMMUNOHISTOCHEMICAL AND MORPHOMETRIC ANALYSIS PERFORMED MANUALLY The tumor cells are NEGATIVE for Her2 (1+). Estrogen Receptor: 100%, POSITIVE, STRONG STAINING INTENSITY Progesterone Receptor: 90%, POSITIVE, STRONG STAINING INTENSITY Proliferation Marker Ki67: 15%   09/05/2019 Initial Diagnosis   Malignant neoplasm of  central portion of right breast in female, estrogen receptor positive ([place])    09/13/2019 Breast MRI   MRI breast 09/13/19 IMPRESSION: 1. Irregular spiculated enhancing mass in the right breast representing the patient's known malignancy. No abnormal right axillary lymph nodes. 2. Prominent left internal mammary lymph node is indeterminate. No abnormal appearing right internal mammary lymph nodes. 3. No evidence of malignancy in the left breast.   10/16/2019 Cancer Staging   Staging form: Breast, AJCC 8th Edition - Pathologic stage from 10/16/2019: Stage IA (pT2, pN0, cM0, G2, ER+, PR+, HER2-, Oncotype DX score: 21) - Signed by Gardenia Phlegm, NP on 11/07/2019    10/16/2019 Oncotype testing   Oncotype 10/16/19 Recurrence score 21 with 7% benefit of Tamoxifen or AI. There is less than 1 % benefit of chemo.    10/16/2019 Surgery   RIGHT BREAST LUMPECTOMY WITH SENTINEL LYMPH NODE BIOPSY by Dr Barry Dienes 10/16/19    10/16/2019 Pathology Results   FINAL MICROSCOPIC DIAGNOSIS: 10/16/19  A. BREAST, RIGHT, LUMPECTOMY:  - Invasive ductal carcinoma, 2.3 cm, Nottingham grade 2 of 3.  - Margins of resection are not involved (Closest margin: 1 mm,  anterior).  - See oncology table.   B. SENTINEL LYMPH NODE, RIGHT AXILLARY #1, BIOPSY:  - One lymph node, negative for carcinoma (0/1).   C. SENTINEL LYMPH NODE, RIGHT AXILLARY #2, BIOPSY:  - One lymph node, negative for carcinoma (0/1).    09/2019 -  Anti-estrogen oral therapy   Anastrozole 31m daily started in Nov 2020. Due to worsened  knee pain/arthritis, she was switched to Tamoxifen in 03/2020.    12/03/2019 - 12/31/2019 Radiation Therapy   Adjuvant RT with Dr Isidore Moos 12/03/19-12/31/19   04/01/2020 Survivorship   SCP delivered by virtual visit per Cira Rue, NP       REVIEW OF SYSTEMS:   Review of Systems  Constitutional:  Negative for chills and fever.  HENT: Negative.    Eyes: Negative.   Respiratory: Negative.     Cardiovascular: Negative.   Gastrointestinal: Negative.   Skin: Negative.   Neurological: Negative.    I have reviewed the past medical history, past surgical history, social history and family history with the patient and they are unchanged from previous note.   PHYSICAL EXAMINATION: ECOG PERFORMANCE STATUS: 2 - Symptomatic, <50% confined to bed  Vitals:   04/15/22 0846 04/15/22 1116  BP: (!) 83/57 (!) 85/59  Pulse: 97 92  Resp: 18 18  Temp: 98 F (36.7 C) 98.1 F (36.7 C)  SpO2: 96% 98%   Filed Weights   04/14/22 1547 04/15/22 0500  Weight: 39.9 kg 43.6 kg    Intake/Output from previous day: 05/17 0701 - 05/18 0700 In: 500 [IV Piggyback:500] Out: -   Physical Exam Vitals reviewed.  Constitutional:      General: She is not in acute distress. HENT:     Head: Normocephalic.  Eyes:     Conjunctiva/sclera: Conjunctivae normal.  Cardiovascular:     Rate and Rhythm: Normal rate and regular rhythm.  Pulmonary:     Effort: Pulmonary effort is normal. No respiratory distress.  Abdominal:     Palpations: Abdomen is soft.  Musculoskeletal:     Right lower leg: No edema.     Left lower leg: No edema.  Skin:    General: Skin is warm and dry.  Neurological:     Mental Status: She is alert and oriented to person, place, and time.    LABORATORY DATA:  I have reviewed the data as listed    Latest Ref Rng & Units 04/15/2022    5:03 AM 04/14/2022    4:19 PM 04/14/2022   12:56 PM  CMP  Glucose 70 - 99 mg/dL 118   94   114    BUN 6 - 20 mg/dL _0 Creatinine 0.44 - 1.00 mg/dL 0.53   0.54   0.55    Sodium 135 - 145 mmol/L 123   124   119    Potassium 3.5 - 5.1 mmol/L 4.6   6.0   5.9    Chloride 98 - 111 mmol/L 98   97   94    CO2 22 - 32 mmol/L _1 Calcium 8.9 - 10.3 mg/dL 7.7   8.4   8.3    Total Protein 6.5 - 8.1 g/dL 5.3   6.3   6.2    Total Bilirubin 0.3 - 1.2 mg/dL 1.9   2.1   2.2    Alkaline Phos 38 - 126 U/L 78   95   96    AST 15 - 41  U/L 50   61   57    ALT 0 - 44 U/L 28   32   27      Lab Results  Component Value Date   WBC 7.7 04/15/2022   HGB 11.0 (L) 04/15/2022   HCT 35.0 (L) 04/15/2022   MCV 78.8 (L) 04/15/2022  PLT 104 (L) 04/15/2022   NEUTROABS 7.2 04/14/2022    No results found for: CEA1, CEA, CAN199, CA125, PSA1  DG Chest 2 View  Result Date: 04/08/2022 CLINICAL DATA:  Pleural effusion. EXAM: CHEST - 2 VIEW COMPARISON:  March 05, 2022. FINDINGS: The heart size and mediastinal contours are within normal limits. Right lung is clear. Interval placement left-sided chest tube. Moderate left pleural effusion is noted. No definite pneumothorax is noted. The visualized skeletal structures are unremarkable. IMPRESSION: Interval placement of left-sided chest tube. Moderate left pleural effusion is noted. Electronically Signed   By: Marijo Conception M.D.   On: 04/08/2022 15:25   DG Chest Portable 1 View  Result Date: 04/14/2022 CLINICAL DATA:  Pleural effusion, pleural drain with large amounts of drainage history of liver tumor. EXAM: PORTABLE CHEST 1 VIEW COMPARISON:  Apr 08, 2022. FINDINGS: Trachea midline. Cardiomediastinal contours and hilar structures are stable. No visible pneumothorax with pleural drain in stable position, tip at the LEFT lung apex. Similar volume of pleural fluid when compared to the study of Apr 08, 2022. Surgical clips project over the RIGHT chest and axilla. On limited assessment there is no acute skeletal process. IMPRESSION: Stable chest x-ray. Pleural drain remains in place with moderate LEFT-sided pleural effusion. Electronically Signed   By: Zetta Bills M.D.   On: 04/14/2022 17:17     Future Appointments  Date Time Provider [place]  04/21/2022 11:40 AM [facility] DIAG TOM[facility] 1 [facility]MM GI-BREAST CE  04/23/2022  1:00 PM Lajuana Matte, MD [facility]  06/09/2022  2:00 PM [facility] LAB [facility] None  06/09/2022  2:40 PM Truitt Merle, MD [facility] None      LOS: 0  days   Addendum  I have seen the patient, examined her. I agree with the assessment and and plan and have edited the notes.   Patient is well-known to me, I saw her last week and stopped her diuretics due to hypotension, and high output from the Pleurx.  She had routine lab in our office yesterday which showed worsening hyponatremia, I referred her to ED for admission.  She was very weak and shaky yesterday, does feel better overall today after IV fluids.  No other new complaints. I agree with Dr. Carlean Purl that nephology consult would be helpful to manage her electrolytes imbalance.  I will send a message to Dr. Kipp Brood, not sure if he is available to see her in the hospital.  Patient has outpatient consult with Dr. Kipp Brood next week, to discuss pleurodesis and pleural biopsy. She has a f/u with me in about 2 months.   Truitt Merle  04/15/2022

## 2022-04-15 NOTE — Assessment & Plan Note (Addendum)
Improving at this time - complicated picture with cirrhosis with recurrent L effusion, hypotension Urine sodium remains <10 Now on lasix and getting daily pleurx drainage Renal has been c/s, appreciate assistanc e with volume, hyponatremia, lytes -> recommending midodrine and albumin with goal MAP >82 Am cortisol ~10, follow ACTH stim (normal) Follow TSH (wnl), Follow BNP elevated

## 2022-04-15 NOTE — Progress Notes (Signed)
PROGRESS NOTE    Theresa Rocha  SWN:[id number] DOB: [birth year] DOA: 04/14/2022 PCP: Seward Carol, MD  Chief Complaint  Patient presents with   abnormal labs    Brief Narrative:   Theresa Rocha is Theresa Rocha pleasant 56 y.o. female with medical history significant for cancer of the right breast, hepatocellular carcinoma, cirrhosis, and recurrent left pleural effusion with PleurX catheter now presenting to the emergency department for evaluation of worsening hyponatremia.  Patient reports that she has been holding her diuretics since 04/08/2022, had outpatient blood work performed today, and was notified that her sodium had decreased to 119.  She was advised to seek further evaluation in the ED.  She has not noticed any acute change in her chronic fatigue and general weakness, denies headache, denies nausea or vomiting, and denies change in her chronic loose stools.  She denies any ankle swelling.  She sometimes develops abdominal distention but none now.  She reports worsening weight loss in the past few weeks.  Denies fevers or chills.  Denies abdominal pain.   ED Course: Upon arrival to the ED, patient is found to be afebrile and saturating well on room air with systolic blood pressure in the 90s.  Chemistry panel notable for sodium of 124 and potassium 6.0.  Patient was given Lokelma and 500 cc normal saline bolus in the ED.    Assessment & Plan:   Principal Problem:   Hyperkalemia Active Problems:   Hyponatremia   Cirrhosis (Theresa Rocha)   Pleural effusion on left   Hypotension   Hepatocellular carcinoma (Theresa Rocha)   Breast cancer (Theresa Rocha)   Assessment and Plan: * Hyperkalemia S/p lokelma IVF improved Follow ACTH stim   Hyponatremia Hypovolemic related to diuretics and daily drainage from pleurx  Urine sodium <10 Improved with IVF Will continue to trend with IVF Ultimately frequency/volume of pleurx drainage likely needs to be decrased GI recommending renal involvement for question of volume and  question of how often to drain fluid Am cortisol ~10, follow ACTH stim  Hypotension Related to cirrhosis and hypovolemia IVF, albumin, midodrine   Pleural effusion on left Per Dr. 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 had reached out to CT surgery regarding possibility of pleurodesis (appt scheduled 5/26) Will reach out to CT surgery  Cirrhosis ([place]) Related to hepatitis C and etoh use disorder  Complicated by esophageal varices, portal hypertensive gastropathy, and progressive multifocal Theresa Rocha s/p microwave ablation 08/2021, s/p [id number] radioembolization to R lobe of liver on 1/24 Appreciate GI assistance, follows with Roosevelt Locks NP at [facility] and [facility]  Breast cancer Theresa Rocha) Follows with [name]      DVT prophylaxis: lovenox Code Status: full Family Communication: none Disposition:   Status is: inpatient The patient will require care spanning > 2 midnights and should be moved to inpatient because: need for GI, renal, CT surgery, oncology c/s   Consultants:  GI Renal Oncology CT surgery  Procedures:  none  Antimicrobials:  Anti-infectives (From admission, onward)    None       Subjective: Not excited to be here  Objective: Vitals:   04/15/22 0846 04/15/22 1116 04/15/22 1254 04/15/22 1332  BP: (!) 83/57 (!) 85/59 (!) 87/65 (!) 84/63  Pulse: 97 92 94 97  Resp: 18  $'18 18 20  'Z$ Temp: 98 F (36.7 C) 98.1 F (36.7 C) 98.3 F (36.8 C) 98.1 F (36.7 C)  TempSrc: Oral Oral Oral Oral  SpO2: 96% 98% 97% 100%  Weight:      Height:        Intake/Output Summary (Last 24 hours) at 04/15/2022 1625 Last data filed at 04/15/2022 1617 Gross per 24 hour  Intake 1127.53 ml  Output --  Net 1127.53 ml   Filed Weights    04/14/22 1547 04/15/22 0500  Weight: 39.9 kg 43.6 kg    Examination:  General exam: Appears calm and comfortable  Respiratory system: unlabored Cardiovascular system: RRR Gastrointestinal system: Abdomen is nondistended, soft and nontender.  Central nervous system: Alert and oriented. No focal neurological deficits. Extremities: no LEE    Data Reviewed: I have personally reviewed following labs and imaging studies  CBC: Recent Labs  Lab 04/14/22 1256 04/14/22 1619 04/15/22 0503  WBC 10.0 9.7 7.7  NEUTROABS 7.8* 7.2  --   HGB 12.3 12.5 11.0*  HCT 37.4 38.7 35.0*  MCV 76.0* 77.7* 78.8*  PLT 125* 125* 104*    Basic Metabolic Panel: Recent Labs  Lab 04/14/22 1256 04/14/22 1619 04/15/22 0503  NA 119* 124* 123*  K 5.9* 6.0* 4.6  CL 94* 97* 98  CO2 20* 21* 20*  GLUCOSE 114* 94 118*  BUN '11 13 14  '$ CREATININE 0.55 0.54 0.53  CALCIUM 8.3* 8.4* 7.7*  MG  --  2.1  --     GFR: Estimated Creatinine Clearance: 54.7 mL/min (by C-G formula based on SCr of 0.53 mg/dL).  Liver Function Tests: Recent Labs  Lab 04/14/22 1256 04/14/22 1619 04/15/22 0503  AST 57* 61* 50*  ALT 27 32 28  ALKPHOS 96 95 78  BILITOT 2.2* 2.1* 1.9*  PROT 6.2* 6.3* 5.3*  ALBUMIN 2.8* 2.6* 2.2*    CBG: No results for input(s): GLUCAP in the last 168 hours.   No results found for this or any previous visit (from the past 240 hour(s)).       Radiology Studies: DG Chest Portable 1 View  Result Date: 04/14/2022 CLINICAL DATA:  Pleural effusion, pleural drain with large amounts of drainage history of liver tumor. EXAM: PORTABLE CHEST 1 VIEW COMPARISON:  Apr 08, 2022. FINDINGS: Trachea midline. Cardiomediastinal contours and hilar structures are stable. No visible pneumothorax with pleural drain in stable position, tip at the LEFT lung apex. Similar volume of pleural fluid when compared to the study of Apr 08, 2022. Surgical clips project over the RIGHT chest and axilla. On limited  assessment there is no acute skeletal process. IMPRESSION: Stable chest x-ray. Pleural drain remains in place with moderate LEFT-sided pleural effusion. Electronically Signed   By: Zetta Bills M.D.   On: 04/14/2022 17:17        Scheduled Meds:  [START ON 04/16/2022] cosyntropin  0.25 mg Intravenous Once   enoxaparin (LOVENOX) injection  30 mg Subcutaneous Q24H   midodrine  5 mg Oral TID WC   nicotine  21 mg Transdermal Daily   pantoprazole  40 mg Oral Daily   sodium chloride flush  3 mL Intravenous Q12H   tamoxifen  20 mg Oral Daily   Continuous Infusions:  sodium chloride 100 mL/hr at 04/15/22 1028   albumin human 25 g (04/15/22 1119)     LOS: 0 days    Time spent: over 30 min    Fayrene Helper, MD [facility]   To contact the attending provider  between 7A-7P or the covering provider during after hours 7P-7A, please log into the web site [email or web address] and access using universal [place] password for that web site. If you do not have the password, please call the [facility] operator.  04/15/2022, 4:25 PM

## 2022-04-15 NOTE — Consult Note (Addendum)
Referring Provider: Dr. Florene Glen, [facility] Primary Care Physician:  Seward Carol, MD Primary Gastroenterologist:  Dr. Henrene Pastor  Reason for Consultation:  Cirrhosis  HPI: Theresa Rocha is a 56 y.o. female with a past medical history of breast cancer s/p lumpectomy and radiation 2020, decompensated cirrhosis secondary to chronic hepatitis C and alcohol use disorder with esophageal varices and hepatocellular carcinoma s/p microwave ablation to the dominant hepatic lesion in the left lobe and she has a second 2.5 cm right liver lesion and also status post yttrium-90 radioembolization to the right lobe of the liver on 12/22/2021 with delivery of a 25 mCi dose of Y-90 SIR Spheres via the right hepatic artery to treat worsening multifocal hepatocellular carcinoma in the right lobe of the liver.  Recurrent left pleural effusion now with Pleurex catheter in place.  She follows with Thornton Papas through [place] [facility] and she controls her diuretics.  Most recently she was on Lasix 40 mg daily and eplerenone as well as potassium supplements, but she had been in communication with [facility] a lot recently saying that the diuretics did not seem to be helping.   TIPS was discussed with the patient by Dr. Kathlene Cote but he said that he cannot guarantee that it would help with the pleural effusion and there would be risk of messing around with the treatment that he is done for her [facility] so he wanted that to be very last resort.  She presented to the ED on this occasion at the recommendation of the cancer center after having outpatient labs drawn yesterday.  Chemistry panel notable for sodium of 124 and potassium 6.0.  Patient was given Lokelma and 500 cc normal saline bolus in the ED. She says that yesterday she felt somewhat shaky, but she is feeling better today.  She describes a change in the color of the fluid from her Pleurx catheter.  She says that she has an appointment with thoracic surgery next week to discuss procedure  to seal the hole where the fluid keeps leaking.  Sodium improved at 123 this morning and potassium normal at 4.6.   12/2021 EGD varices flat  EGD 10/27/2021 by Dr. Henrene Pastor: There were 3 columns of grade II varices were found in the lower third of the esophagus. One of the varix was slightly more prominent and had stigmata (red pigmentation/nipple). There was no active bleeding. After completing the entire endoscopic survey, Three bands were successfully placed on the high risk varix with incomplete eradication of varices overall. There was no bleeding during the procedure.   There was an incidental ringlike esophageal stricture at the gastroesophageal junction. There was mild inflammation in the pyloric region. The stomach was otherwise normal with no evidence of proximal gastric varices or significant portal hypertensive gastropathy. The examined duodenum was normal. The cardia and gastric fundus were normal on retroflexion save small hiatal hernia. 1. Recent upper GI bleed secondary to esophageal varices status post band ligation 2. Incidental distal esophageal ring 3. Mild pyloric inflammation 4. Otherwise normal EGD.   Past Medical History:  Diagnosis Date   Anemia    Breast cancer ([place]) 08/30/2019   Cancer ([place])    liver- just diagnosed, not started treatment yet   Cervicalgia    COPD (chronic obstructive pulmonary disease) (HCC)    Esophageal varices (HCC)    GERD (gastroesophageal reflux disease)    Headache    Hepatic cirrhosis (HCC)    Hepatitis C    resolved 2018   Hepatocellular carcinoma (  HCC)    IBS (irritable bowel syndrome)    Lactose intolerance    Personal history of radiation therapy 11/2019   Right breast   Portal hypertension ([place])    Raynaud's syndrome    Thrombocytopenia ([place])     Past Surgical History:  Procedure Laterality Date   BREAST BIOPSY Right 09/2019   BREAST LUMPECTOMY Right 09/2019   BREAST LUMPECTOMY WITH AXILLARY LYMPH NODE BIOPSY  Right 10/16/2019   Procedure: RIGHT BREAST LUMPECTOMY WITH SENTINEL LYMPH NODE BIOPSY;  Surgeon: Stark Klein, MD;  Location: [facility];  Service: General;  Laterality: Right;   COLONOSCOPY     ESOPHAGEAL BANDING  10/27/2021   Procedure: ESOPHAGEAL BANDING;  Surgeon: Irene Shipper, MD;  Location: [facility];  Service: Endoscopy;;   ESOPHAGOGASTRODUODENOSCOPY (EGD) WITH PROPOFOL N/A 10/27/2021   Procedure: ESOPHAGOGASTRODUODENOSCOPY (EGD) WITH PROPOFOL;  Surgeon: Irene Shipper, MD;  Location: [facility];  Service: Endoscopy;  Laterality: N/A;   ESOPHAGOGASTRODUODENOSCOPY (EGD) WITH PROPOFOL N/A 01/04/2022   Procedure: ESOPHAGOGASTRODUODENOSCOPY (EGD) WITH PROPOFOL;  Surgeon: Irene Shipper, MD;  Location: [facility];  Service: Endoscopy;  Laterality: N/A;   HAMMER TOE SURGERY Left 2012   pins placed in great toe, 2nd,3rd, 4th toes, all pins removed except great toe   IR ANGIOGRAM SELECTIVE EACH ADDITIONAL VESSEL  12/16/2021   IR ANGIOGRAM SELECTIVE EACH ADDITIONAL VESSEL  12/16/2021   IR ANGIOGRAM SELECTIVE EACH ADDITIONAL VESSEL  12/22/2021   IR ANGIOGRAM VISCERAL SELECTIVE  12/16/2021   IR ANGIOGRAM VISCERAL SELECTIVE  12/22/2021   IR EMBO TUMOR ORGAN ISCHEMIA INFARCT INC GUIDE ROADMAPPING  12/22/2021   IR PARACENTESIS  11/13/2019   IR PARACENTESIS  03/04/2022   IR PERC PLEURAL DRAIN W/INDWELL CATH W/IMG GUIDE  03/09/2022   IR RADIOLOGIST EVAL & MGMT  08/16/2019   IR RADIOLOGIST EVAL & MGMT  02/06/2020   IR RADIOLOGIST EVAL & MGMT  06/26/2020   IR RADIOLOGIST EVAL & MGMT  12/31/2020   IR RADIOLOGIST EVAL & MGMT  07/21/2021   IR RADIOLOGIST EVAL & MGMT  10/08/2021   IR RADIOLOGIST EVAL & MGMT  01/13/2022   IR RADIOLOGIST EVAL & MGMT  03/05/2022   IR THORACENTESIS ASP PLEURAL SPACE W/IMG GUIDE  10/12/2021   IR THORACENTESIS ASP PLEURAL SPACE W/IMG GUIDE  10/26/2021   IR THORACENTESIS ASP PLEURAL SPACE W/IMG GUIDE  11/19/2021   IR THORACENTESIS ASP PLEURAL SPACE W/IMG GUIDE  12/22/2021   IR US GUIDE VASC  ACCESS RIGHT  12/16/2021   IR US GUIDE VASC ACCESS RIGHT  12/22/2021   PELVIC LAPAROSCOPY Bilateral 1992   RADIOLOGY WITH ANESTHESIA N/A 09/16/2021   Procedure: CT MICROWAVE ABLATION;  Surgeon: Aletta Edouard, MD;  Location: [facility];  Service: Radiology;  Laterality: N/A;   UPPER GASTROINTESTINAL ENDOSCOPY  2002   had esophageal dilitation    Prior to Admission medications   Medication Sig Start Date End Date Taking? Authorizing Provider  baclofen (LIORESAL) 10 MG tablet Take 5 mg by mouth daily as needed for muscle spasms. 03/12/22  Yes [provider]  dicyclomine (BENTYL) 20 MG tablet Take 0.5 tablets (10 mg total) by mouth every 6 (six) hours as needed for spasms. Take one tablet every 6-8 hours as needed for abdominal cramping Patient taking differently: Take 20 mg by mouth daily as needed (abdominal cramping). 01/07/22  Yes Noralyn Pick, NP  loperamide (IMODIUM) 2 MG capsule Take 4 mg by mouth daily as needed for diarrhea or loose stools.   Yes [provider]  nicotine (NICODERM CQ - DOSED IN MG/24 HOURS) 21 mg/24hr patch Place 21 mg onto the skin daily as needed (smoking cessation on work days).   Yes [provider]  oxyCODONE (OXY IR/ROXICODONE) 5 MG immediate release tablet Take 1-2 tablets (5-10 mg total) by mouth every 4 (four) hours as needed for severe pain. 03/15/22  Yes Truitt Merle, MD  pantoprazole (PROTONIX) 40 MG tablet Take 1 tablet (40 mg total) by mouth daily. 12/01/21  Yes Noralyn Pick, NP  promethazine (PHENERGAN) 12.5 MG tablet Take 1 tablet (12.5 mg total) by mouth every 6 (six) hours as needed for nausea or vomiting. 03/10/22  Yes Han, Aimee H, PA-C  tamoxifen (NOLVADEX) 20 MG tablet TAKE 1 TABLET BY MOUTH EVERY DAY Patient taking differently: Take 20 mg by mouth every evening. 10/13/20  Yes Truitt Merle, MD  docusate sodium (COLACE) 100 MG capsule Take 1 capsule (100 mg total) by mouth 2 (two) times daily. Patient not taking:  Reported on 04/14/2022 03/10/22   Han, Aimee H, PA-C  furosemide (LASIX) 40 MG tablet Take 1 tablet (40 mg total) by mouth daily. Patient not taking: Reported on 04/14/2022 02/23/22   Georgette Shell, MD  lidocaine (LIDODERM) 5 % Place 2 patches onto the skin daily. Remove & Discard patch within 12 hours or as directed by MD Patient not taking: Reported on 04/14/2022 02/23/22   Georgette Shell, MD  potassium chloride SA (KLOR-CON M) 20 MEQ tablet Take 1 tablet (20 mEq total) by mouth daily. Patient not taking: Reported on 04/14/2022 02/23/22   Georgette Shell, MD    Current Facility-Administered Medications  Medication Dose Route Frequency Provider Last Rate Last Admin   0.9 %  sodium chloride infusion   Intravenous Continuous Elodia Florence., MD 100 mL/hr at 04/15/22 1028 New Bag at 04/15/22 1028   acetaminophen (TYLENOL) tablet 650 mg  650 mg Oral Q6H PRN Opyd, Ilene Qua, MD       Or   acetaminophen (TYLENOL) suppository 650 mg  650 mg Rectal Q6H PRN Opyd, Ilene Qua, MD       albumin human 25 % solution 25 g  25 g Intravenous Q6H Elodia Florence., MD 60 mL/hr at 04/15/22 1119 25 g at 04/15/22 1119   baclofen (LIORESAL) tablet 5 mg  5 mg Oral Daily PRN Vianne Bulls, MD       [START ON 04/16/2022] cosyntropin (CORTROSYN) injection 0.25 mg  0.25 mg Intravenous Once Elodia Florence., MD       dicyclomine (BENTYL) tablet 10 mg  10 mg Oral Q6H PRN Opyd, Ilene Qua, MD       enoxaparin (LOVENOX) injection 30 mg  30 mg Subcutaneous Q24H Opyd, Ilene Qua, MD   30 mg at 04/14/22 2242   midodrine (PROAMATINE) tablet 5 mg  5 mg Oral TID WC Elodia Florence., MD       oxyCODONE (Oxy IR/ROXICODONE) immediate release tablet 5-10 mg  5-10 mg Oral Q4H PRN Opyd, Ilene Qua, MD   10 mg at 04/15/22 0255   pantoprazole (PROTONIX) EC tablet 40 mg  40 mg Oral Daily Opyd, Ilene Qua, MD   40 mg at 04/15/22 0944   senna-docusate (Senokot-S) tablet 1 tablet  1 tablet Oral QHS PRN Opyd,  Ilene Qua, MD       sodium chloride flush (NS) 0.9 % injection 3 mL  3 mL Intravenous Q12H Opyd, Ilene Qua, MD   3  mL at 04/15/22 1027   tamoxifen (NOLVADEX) tablet 20 mg  20 mg Oral Daily Opyd, Ilene Qua, MD   20 mg at 04/15/22 0944    Allergies as of 04/14/2022 - Review Complete 04/14/2022  Allergen Reaction Noted   Hydromorphone hcl Nausea And Vomiting    Aspirin Nausea Only     Family History  Problem Relation Age of Onset   Liver cancer Mother    Lung cancer Mother    Lung cancer Father    Liver cancer Maternal Grandmother    Colon cancer Neg Hx    Esophageal cancer Neg Hx    Stomach cancer Neg Hx    Rectal cancer Neg Hx     Social History   Socioeconomic History   Marital status: Married    Spouse name: Not on file   Number of children: Not on file   Years of education: Not on file   Highest education level: Not on file  Occupational History   Not on file  Tobacco Use   Smoking status: Every Day    Packs/day: 0.50    Years: 30.00    Pack years: 15.00    Types: Cigarettes    Start date: 11/29/1978   Smokeless tobacco: Never  Vaping Use   Vaping Use: Never used  Substance and Sexual Activity   Alcohol use: Not Currently    Alcohol/week: 6.0 standard drinks    Types: 6 Cans of beer per week    Comment: stopped 06/2019   Drug use: No   Sexual activity: Not Currently    Partners: Male  Other Topics Concern   Not on file  Social History Narrative   Not on file   Social Determinants of Health   Financial Resource Strain: Not on file  Food Insecurity: Not on file  Transportation Needs: Not on file  Physical Activity: Not on file  Stress: Not on file  Social Connections: Not on file  Intimate Partner Violence: Not on file    Review of Systems: ROS is O/W negative except as mentioned in HPI.  Physical Exam: Vital signs in last 24 hours: Temp:  [97.9 F (36.6 C)-98.3 F (36.8 C)] 98.1 F (36.7 C) (05/18 1116) Pulse Rate:  [92-105] 92 (05/18  1116) Resp:  [16-20] 18 (05/18 1116) BP: (83-101)/(54-71) 85/59 (05/18 1116) SpO2:  [91 %-100 %] 98 % (05/18 1116) Weight:  [39.9 kg-43.6 kg] 43.6 kg (05/18 0500) Last BM Date : 04/14/22 General:  Alert, thin, pleasant and cooperative in NAD Head:  Normocephalic and atraumatic. Eyes:  Sclera clear, no icterus.  Conjunctiva pink. Ears:  Normal auditory acuity. Mouth:  No deformity or lesions.   Lungs:  Clear throughout to auscultation.  Decreased BS on the left. Heart:  Regular rate and rhythm; no murmurs, clicks, rubs, or gallops. Abdomen:  Soft, non-distended.  BS present.  Non-tender.  Msk:  Symmetrical without gross deformities. Pulses:  Normal pulses noted. Extremities:  Without clubbing or edema. Neurologic:  Alert and oriented x 4;  grossly normal neurologically. Skin:  Intact without significant lesions or rashes. Psych:  Alert and cooperative. Normal mood and affect.  Intake/Output from previous day: 05/17 0701 - 05/18 0700 In: 500 [IV Piggyback:500] Out: -  Intake/Output this shift: Total I/O In: 120 [P.O.:120] Out: -   Lab Results: Recent Labs    04/14/22 1256 04/14/22 1619 04/15/22 0503  WBC 10.0 9.7 7.7  HGB 12.3 12.5 11.0*  HCT 37.4 38.7 35.0*  PLT 125* 125* 104*  BMET Recent Labs    04/14/22 1256 04/14/22 1619 04/15/22 0503  NA 119* 124* 123*  K 5.9* 6.0* 4.6  CL 94* 97* 98  CO2 20* 21* 20*  GLUCOSE 114* 94 118*  BUN '11 13 14  '$ CREATININE 0.55 0.54 0.53  CALCIUM 8.3* 8.4* 7.7*   LFT Recent Labs    04/15/22 0503  PROT 5.3*  ALBUMIN 2.2*  AST 50*  ALT 28  ALKPHOS 78  BILITOT 1.9*   Studies/Results: DG Chest Portable 1 View  Result Date: 04/14/2022 CLINICAL DATA:  Pleural effusion, pleural drain with large amounts of drainage history of liver tumor. EXAM: PORTABLE CHEST 1 VIEW COMPARISON:  Apr 08, 2022. FINDINGS: Trachea midline. Cardiomediastinal contours and hilar structures are stable. No visible pneumothorax with pleural drain in  stable position, tip at the LEFT lung apex. Similar volume of pleural fluid when compared to the study of Apr 08, 2022. Surgical clips project over the RIGHT chest and axilla. On limited assessment there is no acute skeletal process. IMPRESSION: Stable chest x-ray. Pleural drain remains in place with moderate LEFT-sided pleural effusion. Electronically Signed   By: Zetta Bills M.D.   On: 04/14/2022 17:17    IMPRESSION:  49) 56 year old female with decompensated cirrhosis secondary to hepatitis C (s/p treatment with SVR) and alcohol use disorder with esophageal varices, portal hypertensive gastropathy and progressive multifocal HCC s/p microwave ablation 08/2021 and also status post yttrium-90 radioembolization to the right lobe of the liver on 12/22/2021 with delivery of a 25 mCi dose of Y-90 SIR Spheres via the right hepatic artery to treat worsening multifocal hepatocellular carcinoma in the right lobe of the liver.  She is not a transplant candidate due to history of invasive breast cancer and multifocal HCC.  Follows with Roosevelt Locks NP at [facility]   2) Recurrent left pleural effusion, query hepatic hydrothorax. No plans for TIPS at this time, per Dr. Kathlene Cote, absolute last resort as he cannot guarantee that it will help.  3) Thrombocytopenia secondary to cirrhosis/mild splenomegaly   4) Hyponatremia and hyperkalemia:  Na+ was 119 and K+ was 6.0.  Diuretics held.  Na+ now 123 and K+ normal at 4.6 after Lokelma and NS bolus of 500 mLs.  PLAN: -She has an appointment with the thoracic surgeons on 5/26. -She describes a change in the color of the fluid from her Pleurx catheter.  If this is drained while she is here then may want to do fluid studies. -Question if we restart at least low-dose diuretics with very close monitoring of her labs. -Other plans per Dr. Carlean Purl.   Laban Emperor. Zehr  04/15/2022, 11:54 AM     [facility] Attending   I have taken an interval  history, reviewed the chart and examined the patient. I agree with the Advanced Practitioner's note, impression and recommendations and have additional input below:  Complicated situation of profound hyponatremia and hyperkalemia - probably from diuretics and volume shifts in cirrhosis. 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  My recommendations are:  1) Continue to hold diuretics 2)Consult nephrology for help with electrolytes and volume status given needs to drain the pleural fluid (how much and how often?) and when to restart diuretics 3) Send pleural fluid for studies (ordered cell ct, cx, albumin and LDH, cytology) 4) see if CT surgery could evaluate her in house re: potential  surgical correction of the peritoneal-pleural fistula - she is waiting to see as outpatient and if we are able to expedite that could reduce risk of additional problems 5) I am checking INR tomorrow  Will follow  Gatha Mayer, MD, [facility] See Shea Evans on call - gastroenterology for best contact person - I am working days [phone] 04/15/2022 3:24 PM

## 2022-04-15 NOTE — Hospital Course (Addendum)
Theresa Rocha 56 y.o. female with history of right-sided breast cancer, hepatocellular carcinoma, cirrhosis, and recurrent left pleural effusions who was transferred from [facility] for management of her recurrent effusion. She states that in October she began experiencing some shortness of breath and was diagnosed with a left pleural effusion.  This came shortly after completion of her ablation therapy to her liver for her [place].  She has had multiple thoracentesis, and also had a Pleurx catheter placed.  Na continues to be an issue.

## 2022-04-15 NOTE — Progress Notes (Signed)
    OVERNIGHT PROGRESS REPORT  EKG available in physical chart. Obtained for QT/QTc value for antiemetic medication.   Gershon Cull MSNA MSN ACNPC-AG Acute Care Nurse Practitioner [place]

## 2022-04-15 NOTE — Assessment & Plan Note (Addendum)
S/p lokelma Follow with lasix

## 2022-04-15 NOTE — ED Notes (Signed)
ED TO INPATIENT HANDOFF REPORT  Name/Age/Gender Theresa Rocha 56 y.o. female  Code Status    Code Status Orders  (From admission, onward)           Start     Ordered   04/14/22 2226  Full code  Continuous        04/14/22 2226           Code Status History     Date Active Date Inactive Code Status Order ID Comments User Context   02/21/2022 1346 02/23/2022 1534 Full Code [id number]  Reubin Milan, MD ED   10/26/2021 2239 11/02/2021 2240 Full Code [id number]  Rise Patience, MD ED   09/30/2021 1537 10/01/2021 1552 Full Code [id number]  Reubin Milan, MD Inpatient       Home/SNF/Other Home  Chief Complaint Hyperkalemia [E87.5]  Level of Care/Admitting Diagnosis ED Disposition     ED Disposition  Admit   Condition  --   Comment  Hospital Area: [facility] [[id number]]  Level of Care: Telemetry [5]  Admit to tele based on following criteria: Monitor QTC interval  May place patient in observation at [facility] or [place] if equivalent level of care is available:: Yes  Covid Evaluation: Asymptomatic - no recent exposure (last 10 days) testing not required  Diagnosis: Hyperkalemia [295284]  Admitting Physician: Vianne Bulls [[id number]]  Attending Physician: Vianne Bulls [[id number]]          Medical History Past Medical History:  Diagnosis Date   Anemia    Breast cancer ([place]) 08/30/2019   Cancer ([place])    liver- just diagnosed, not started treatment yet   Cervicalgia    COPD (chronic obstructive pulmonary disease) ([place])    Esophageal varices (HCC)    GERD (gastroesophageal reflux disease)    Headache    Hepatic cirrhosis (HCC)    Hepatitis C    resolved 2018   Hepatocellular carcinoma (HCC)    IBS (irritable bowel syndrome)    Lactose intolerance    Personal history of radiation therapy 11/2019   Right breast   Portal hypertension (HCC)    Raynaud's syndrome    Thrombocytopenia (HCC)     Allergies Allergies   Allergen Reactions   Hydromorphone Hcl Nausea And Vomiting    Severe vomiting   Aspirin Nausea Only    IV Location/Drains/Wounds Patient Lines/Drains/Airways Status     Active Line/Drains/Airways     Name Placement date Placement time Site Days   Peripheral IV 04/14/22 20 G Left Antecubital 04/14/22  1630  Antecubital  1   Closed System Drain Lateral;Left Back Other (Comment) 15.5 Fr. 03/09/22  1248  Back  37            Labs/Imaging Results for orders placed or performed during the hospital encounter of 04/14/22 (from the past 48 hour(s))  CBC with Differential     Status: Abnormal   Collection Time: 04/14/22  4:19 PM  Result Value Ref Range   WBC 9.7 4.0 - 10.5 K/uL   RBC 4.98 3.87 - 5.11 MIL/uL   Hemoglobin 12.5 12.0 - 15.0 g/dL   HCT 38.7 36.0 - 46.0 %   MCV 77.7 (L) 80.0 - 100.0 fL   MCH 25.1 (L) 26.0 - 34.0 pg   MCHC 32.3 30.0 - 36.0 g/dL   RDW 20.9 (H) 11.5 - 15.5 %   Platelets 125 (L) 150 - 400 K/uL    Comment: Immature Platelet Fraction may be  clinically indicated, consider ordering this additional test [id number]    nRBC 0.0 0.0 - 0.2 %   Neutrophils Relative % 74 %   Neutro Abs 7.2 1.7 - 7.7 K/uL   Lymphocytes Relative 7 %   Lymphs Abs 0.7 0.7 - 4.0 K/uL   Monocytes Relative 16 %   Monocytes Absolute 1.6 (H) 0.1 - 1.0 K/uL   Eosinophils Relative 1 %   Eosinophils Absolute 0.1 0.0 - 0.5 K/uL   Basophils Relative 1 %   Basophils Absolute 0.1 0.0 - 0.1 K/uL   Immature Granulocytes 1 %   Abs Immature Granulocytes 0.08 (H) 0.00 - 0.07 K/uL    Comment: Performed at [facility], [place] [address] [place], [place] [address]  Comprehensive metabolic panel     Status: Abnormal   Collection Time: 04/14/22  4:19 PM  Result Value Ref Range   Sodium 124 (L) 135 - 145 mmol/L   Potassium 6.0 (H) 3.5 - 5.1 mmol/L   Chloride 97 (L) 98 - 111 mmol/L   CO2 21 (L) 22 - 32 mmol/L   Glucose, Bld 94 70 - 99 mg/dL    Comment: Glucose reference range  applies only to samples taken after fasting for at least 8 hours.   BUN 13 6 - 20 mg/dL   Creatinine, Ser 0.54 0.44 - 1.00 mg/dL   Calcium 8.4 (L) 8.9 - 10.3 mg/dL   Total Protein 6.3 (L) 6.5 - 8.1 g/dL   Albumin 2.6 (L) 3.5 - 5.0 g/dL   AST 61 (H) 15 - 41 U/L   ALT 32 0 - 44 U/L   Alkaline Phosphatase 95 38 - 126 U/L   Total Bilirubin 2.1 (H) 0.3 - 1.2 mg/dL   GFR, Estimated >60 >60 mL/min    Comment: (NOTE) Calculated using the CKD-EPI Creatinine Equation (2021)    Anion gap 6 5 - 15    Comment: Performed at [facility], [place] [address] [place], [place] [address]  Lipase, blood     Status: Abnormal   Collection Time: 04/14/22  4:19 PM  Result Value Ref Range   Lipase 71 (H) 11 - 51 U/L    Comment: Performed at [facility], [place] [address] [place], [place] [address]  Magnesium     Status: None   Collection Time: 04/14/22  4:19 PM  Result Value Ref Range   Magnesium 2.1 1.7 - 2.4 mg/dL    Comment: Performed at [facility], [place] [address], [place], [place] [address]  Urinalysis, Routine w reflex microscopic Urine, Clean Catch     Status: Abnormal   Collection Time: 04/14/22  7:34 PM  Result Value Ref Range   Color, Urine AMBER (A) YELLOW    Comment: BIOCHEMICALS MAY BE AFFECTED BY COLOR   APPearance HAZY (A) CLEAR   Specific Gravity, Urine 1.029 1.005 - 1.030   pH 5.0 5.0 - 8.0   Glucose, UA NEGATIVE NEGATIVE mg/dL   Hgb urine dipstick NEGATIVE NEGATIVE   Bilirubin Urine NEGATIVE NEGATIVE   Ketones, ur 5 (A) NEGATIVE mg/dL   Protein, ur NEGATIVE NEGATIVE mg/dL   Nitrite NEGATIVE NEGATIVE   Leukocytes,Ua NEGATIVE NEGATIVE    Comment: Performed at [facility], [place] [address], [place], [place] [address]  Osmolality, urine     Status: Abnormal   Collection Time: 04/14/22  7:34 PM  Result Value Ref Range   Osmolality, Ur 924 (H) 300 - 900 mOsm/kg  Comment: Performed at [facility],  [place] [address], [place], [place] [address]  Na and K (sodium & potassium), rand urine     Status: None   Collection Time: 04/14/22  7:34 PM  Result Value Ref Range   Sodium, Ur <10 mmol/L   Potassium Urine 144 mmol/L    Comment: Performed at [facility], [place] [address] [place], [place] [address]  Creatinine, urine, random     Status: None   Collection Time: 04/14/22  7:34 PM  Result Value Ref Range   Creatinine, Urine 252.70 mg/dL    Comment: Performed at [facility], [place] [address], [place], [place] [address]   DG Chest Portable 1 View  Result Date: 04/14/2022 CLINICAL DATA:  Pleural effusion, pleural drain with large amounts of drainage history of liver tumor. EXAM: PORTABLE CHEST 1 VIEW COMPARISON:  Apr 08, 2022. FINDINGS: Trachea midline. Cardiomediastinal contours and hilar structures are stable. No visible pneumothorax with pleural drain in stable position, tip at the LEFT lung apex. Similar volume of pleural fluid when compared to the study of Apr 08, 2022. Surgical clips project over the RIGHT chest and axilla. On limited assessment there is no acute skeletal process. IMPRESSION: Stable chest x-ray. Pleural drain remains in place with moderate LEFT-sided pleural effusion. Electronically Signed   By: Zetta Bills M.D.   On: 04/14/2022 17:17    Pending Labs Unresulted Labs (From admission, onward)     Start     Ordered   04/21/22 0500  Creatinine, serum  (enoxaparin (LOVENOX)    CrCl >/= 30 ml/min)  Weekly,   R     Comments: while on enoxaparin therapy    04/14/22 2226   04/15/22 2223  Phosphorus  Once,   R        04/14/22 2226   04/15/22 0500  Comprehensive metabolic panel  Daily,   R      04/14/22 2226   04/15/22 0500  CBC  Tomorrow morning,   R        04/14/22 2226   04/14/22 2240  Osmolality  Once,   R        04/14/22 2240   04/14/22 1907  Urea nitrogen, urine  Once,   URGENT        04/14/22 1906   04/14/22 1907  Uric acid, random  urine  Once,   URGENT        04/14/22 1906            Vitals/Pain Today's Vitals   04/14/22 1915 04/14/22 2244 04/14/22 2300 04/14/22 2308  BP: 96/71  (!) 85/61   Pulse: 99  100 99  Resp:   16   Temp:      TempSrc:      SpO2: 99%  95% 94%  Weight:      Height:      PainSc:  7       Isolation Precautions No active isolations  Medications Medications  oxyCODONE (Oxy IR/ROXICODONE) immediate release tablet 5-10 mg (has no administration in time range)  tamoxifen (NOLVADEX) tablet 20 mg (has no administration in time range)  dicyclomine (BENTYL) tablet 10 mg (has no administration in time range)  pantoprazole (PROTONIX) EC tablet 40 mg (has no administration in time range)  baclofen (LIORESAL) tablet 5 mg (has no administration in time range)  enoxaparin (LOVENOX) injection 30 mg (30 mg Subcutaneous Given 04/14/22 2242)  sodium chloride flush (NS) 0.9 % injection 3 mL (  3 mLs Intravenous Given by Other 04/14/22 2242)  acetaminophen (TYLENOL) tablet 650 mg (has no administration in time range)    Or  acetaminophen (TYLENOL) suppository 650 mg (has no administration in time range)  senna-docusate (Senokot-S) tablet 1 tablet (has no administration in time range)  sodium chloride 0.9 % bolus 500 mL (0 mLs Intravenous Stopped 04/14/22 1922)  sodium zirconium cyclosilicate (LOKELMA) packet 10 g (10 g Oral Given 04/14/22 1833)    Mobility walks

## 2022-04-16 ENCOUNTER — Encounter (HOSPITAL_COMMUNITY): Payer: Self-pay | Admitting: Family Medicine

## 2022-04-16 ENCOUNTER — Ambulatory Visit: Payer: BC Managed Care – PPO | Admitting: Hematology

## 2022-04-16 ENCOUNTER — Telehealth: Payer: Self-pay

## 2022-04-16 ENCOUNTER — Other Ambulatory Visit: Payer: BC Managed Care – PPO

## 2022-04-16 ENCOUNTER — Inpatient Hospital Stay (HOSPITAL_COMMUNITY): Payer: BC Managed Care – PPO

## 2022-04-16 DIAGNOSIS — K7469 Other cirrhosis of liver: Secondary | ICD-10-CM | POA: Diagnosis not present

## 2022-04-16 DIAGNOSIS — J9621 Acute and chronic respiratory failure with hypoxia: Secondary | ICD-10-CM | POA: Insufficient documentation

## 2022-04-16 DIAGNOSIS — J90 Pleural effusion, not elsewhere classified: Secondary | ICD-10-CM | POA: Diagnosis not present

## 2022-04-16 DIAGNOSIS — B1920 Unspecified viral hepatitis C without hepatic coma: Secondary | ICD-10-CM | POA: Diagnosis not present

## 2022-04-16 DIAGNOSIS — E871 Hypo-osmolality and hyponatremia: Secondary | ICD-10-CM | POA: Diagnosis not present

## 2022-04-16 DIAGNOSIS — D649 Anemia, unspecified: Secondary | ICD-10-CM | POA: Insufficient documentation

## 2022-04-16 LAB — ACTH STIMULATION, 3 TIME POINTS
Cortisol, 30 Min: 27.1 ug/dL
Cortisol, 60 Min: 31.4 ug/dL
Cortisol, Base: 17.8 ug/dL

## 2022-04-16 LAB — COMPREHENSIVE METABOLIC PANEL
ALT: 20 U/L (ref 0–44)
AST: 39 U/L (ref 15–41)
Albumin: 3.1 g/dL — ABNORMAL LOW (ref 3.5–5.0)
Alkaline Phosphatase: 58 U/L (ref 38–126)
Anion gap: 6 (ref 5–15)
BUN: 11 mg/dL (ref 6–20)
CO2: 18 mmol/L — ABNORMAL LOW (ref 22–32)
Calcium: 7.3 mg/dL — ABNORMAL LOW (ref 8.9–10.3)
Chloride: 99 mmol/L (ref 98–111)
Creatinine, Ser: 0.68 mg/dL (ref 0.44–1.00)
GFR, Estimated: >60 mL/min (ref >60)
Glucose, Bld: 91 mg/dL (ref 70–99)
Potassium: 4.6 mmol/L (ref 3.5–5.1)
Sodium: 123 mmol/L — ABNORMAL LOW (ref 135–145)
Total Bilirubin: 2.1 mg/dL — ABNORMAL HIGH (ref 0.3–1.2)
Total Protein: 5.2 g/dL — ABNORMAL LOW (ref 6.5–8.1)

## 2022-04-16 LAB — CBC WITH DIFFERENTIAL/PLATELET
Abs Immature Granulocytes: 0.1 10*3/uL — ABNORMAL HIGH (ref 0.00–0.07)
Basophils Absolute: 0.1 10*3/uL (ref 0.0–0.1)
Basophils Relative: 1 %
Eosinophils Absolute: 0 10*3/uL (ref 0.0–0.5)
Eosinophils Relative: 0 %
HCT: 30.7 % — ABNORMAL LOW (ref 36.0–46.0)
Hemoglobin: 9.5 g/dL — ABNORMAL LOW (ref 12.0–15.0)
Immature Granulocytes: 1 %
Lymphocytes Relative: 4 %
Lymphs Abs: 0.5 10*3/uL — ABNORMAL LOW (ref 0.7–4.0)
MCH: 24.9 pg — ABNORMAL LOW (ref 26.0–34.0)
MCHC: 30.9 g/dL (ref 30.0–36.0)
MCV: 80.4 fL (ref 80.0–100.0)
Monocytes Absolute: 1 10*3/uL (ref 0.1–1.0)
Monocytes Relative: 9 %
Neutro Abs: 9.8 10*3/uL — ABNORMAL HIGH (ref 1.7–7.7)
Neutrophils Relative %: 85 %
Platelets: 78 10*3/uL — ABNORMAL LOW (ref 150–400)
RBC: 3.82 MIL/uL — ABNORMAL LOW (ref 3.87–5.11)
RDW: 20.1 % — ABNORMAL HIGH (ref 11.5–15.5)
WBC: 11.5 10*3/uL — ABNORMAL HIGH (ref 4.0–10.5)
nRBC: 0 % (ref 0.0–0.2)

## 2022-04-16 LAB — BODY FLUID CELL COUNT WITH DIFFERENTIAL
Lymphs, Fluid: 16 %
Monocyte-Macrophage-Serous Fluid: 52 % (ref 50–90)
Neutrophil Count, Fluid: 32 % — ABNORMAL HIGH (ref 0–25)
Total Nucleated Cell Count, Fluid: 181 cu mm (ref 0–1000)

## 2022-04-16 LAB — BASIC METABOLIC PANEL
Anion gap: 7 (ref 5–15)
BUN: 10 mg/dL (ref 6–20)
CO2: 18 mmol/L — ABNORMAL LOW (ref 22–32)
Calcium: 7.8 mg/dL — ABNORMAL LOW (ref 8.9–10.3)
Chloride: 100 mmol/L (ref 98–111)
Creatinine, Ser: 0.57 mg/dL (ref 0.44–1.00)
GFR, Estimated: >60 mL/min (ref >60)
Glucose, Bld: 96 mg/dL (ref 70–99)
Potassium: 4.5 mmol/L (ref 3.5–5.1)
Sodium: 125 mmol/L — ABNORMAL LOW (ref 135–145)

## 2022-04-16 LAB — BRAIN NATRIURETIC PEPTIDE: B Natriuretic Peptide: 472.4 pg/mL — ABNORMAL HIGH (ref 0.0–100.0)

## 2022-04-16 LAB — SODIUM
Sodium: 124 mmol/L — ABNORMAL LOW (ref 135–145)
Sodium: 125 mmol/L — ABNORMAL LOW (ref 135–145)

## 2022-04-16 LAB — MAGNESIUM: Magnesium: 1.7 mg/dL (ref 1.7–2.4)

## 2022-04-16 LAB — PROTIME-INR
INR: 1.7 — ABNORMAL HIGH (ref 0.8–1.2)
Prothrombin Time: 20.2 seconds — ABNORMAL HIGH (ref 11.4–15.2)

## 2022-04-16 LAB — SODIUM, URINE, RANDOM: Sodium, Ur: <10 mmol/L

## 2022-04-16 LAB — URIC ACID, RANDOM URINE: Uric Acid, Urine: 97.8 mg/dL

## 2022-04-16 LAB — ALBUMIN, PLEURAL OR PERITONEAL FLUID: Albumin, Fluid: <1.5 g/dL

## 2022-04-16 LAB — LACTATE DEHYDROGENASE: LDH: 139 U/L (ref 98–192)

## 2022-04-16 LAB — OSMOLALITY, URINE: Osmolality, Ur: 631 mOsm/kg (ref 300–900)

## 2022-04-16 LAB — TSH: TSH: 2.282 u[IU]/mL (ref 0.350–4.500)

## 2022-04-16 LAB — LACTATE DEHYDROGENASE, PLEURAL OR PERITONEAL FLUID: LD, Fluid: 27 U/L — ABNORMAL HIGH (ref 3–23)

## 2022-04-16 LAB — UREA NITROGEN, URINE: Urea Nitrogen, Ur: 1170 mg/dL

## 2022-04-16 LAB — OSMOLALITY: Osmolality: 253 mOsm/kg — ABNORMAL LOW (ref 275–295)

## 2022-04-16 LAB — PHOSPHORUS: Phosphorus: 2.6 mg/dL (ref 2.5–4.6)

## 2022-04-16 MED ORDER — ALBUMIN HUMAN 25 % IV SOLN
25.0000 g | Freq: Three times a day (TID) | INTRAVENOUS | Status: AC
  Administered 2022-04-16 – 2022-04-18 (×6): 25 g via INTRAVENOUS
  Filled 2022-04-16 (×6): qty 100

## 2022-04-16 MED ORDER — GUAIFENESIN-DM 100-10 MG/5ML PO SYRP
5.0000 mL | ORAL_SOLUTION | ORAL | Status: DC | PRN
  Administered 2022-04-16: 5 mL via ORAL
  Filled 2022-04-16: qty 10

## 2022-04-16 MED ORDER — KATE FARMS STANDARD 1.4 PO LIQD
325.0000 mL | Freq: Three times a day (TID) | ORAL | Status: DC
Start: 2022-04-16 — End: 2022-04-21
  Administered 2022-04-16 – 2022-04-21 (×7): 325 mL via ORAL
  Filled 2022-04-16 (×17): qty 325

## 2022-04-16 MED ORDER — PHENOL 1.4 % MT LIQD
1.0000 | OROMUCOSAL | Status: DC | PRN
  Filled 2022-04-16: qty 177

## 2022-04-16 MED ORDER — SODIUM CHLORIDE 0.9 % IV SOLN
3.0000 g | Freq: Four times a day (QID) | INTRAVENOUS | Status: DC
  Administered 2022-04-16: 3 g via INTRAVENOUS
  Filled 2022-04-16 (×3): qty 8

## 2022-04-16 MED ORDER — ADULT MULTIVITAMIN W/MINERALS CH
1.0000 | ORAL_TABLET | Freq: Every day | ORAL | Status: DC
  Administered 2022-04-16 – 2022-05-03 (×17): 1 via ORAL
  Filled 2022-04-16 (×17): qty 1

## 2022-04-16 MED ORDER — SODIUM CHLORIDE 0.9 % IV SOLN
3.0000 g | Freq: Four times a day (QID) | INTRAVENOUS | Status: DC
  Administered 2022-04-16 – 2022-04-19 (×12): 3 g via INTRAVENOUS
  Filled 2022-04-16 (×13): qty 8

## 2022-04-16 MED ORDER — MIDODRINE HCL 5 MG PO TABS
12.5000 mg | ORAL_TABLET | Freq: Three times a day (TID) | ORAL | Status: DC
Start: 2022-04-16 — End: 2022-04-22
  Administered 2022-04-16 – 2022-04-22 (×18): 12.5 mg via ORAL
  Filled 2022-04-16 (×17): qty 3

## 2022-04-16 NOTE — Progress Notes (Signed)
Md Florene Glen updated on pt health, BP 87/62 MAP 71. Pt complains of not being able to take full deep breaths. Denies dizziness/lightheaded.  Md instructed to proceed with plan to pull 1Liter of fluid from pleurex drain.   Per MD goal is to keep MAP > 65. SBP in 80s is acceptable only if pt is asymptomatic.  If MAP <65, give Albumin   Rapid response RN and Agricultural consultant notified for assistance.

## 2022-04-16 NOTE — Progress Notes (Signed)
Rn notified on call MD, Lara Mulch about pt in need of draining pleurX via verbal order from MD Florene Glen. BP 87/57 MAP 64. Pt only complains of not being able to take deep breaths and feeling extremely cold. No distress noted. Rapid response RN notified in case more assistance needed. Request for next steps.  MD Hal Hope verbal order given to proceed with caution in draining 1L from PleurX and monitor BP.

## 2022-04-16 NOTE — Progress Notes (Signed)
Pharmacy Antibiotic Note  Theresa Rocha is a 56 y.o. female with [place], breast cancer, cirrhosis, and recurrent pleural effusion (s/p Pleurx catheter placement) who presented to the ED on 04/14/2022 after she was found to have low sodium with labs collected at [facility].  CXR on 04/16/22 showed enlarging left pleural effusion and  increased patchy consolidation right lower lung field with concern for pneumonia or aspiration.  Pharmacy has been consulted to dose unasyn for aspiration PNA.  Plan: - unasyn 3gm IV q6h - With stable renal function, pharmacy will sign off for abx consult.  Reconsult [place] if need further assistance.  _______________________________ Height: '5\' 3"'$  (160 cm) Weight: 49.8 kg (109 lb 12.6 oz) IBW/kg (Calculated) : 52.4  Temp (24hrs), Avg:98.8 F (37.1 C), Min:98.1 F (36.7 C), Max:99.8 F (37.7 C)  Recent Labs  Lab 04/14/22 1256 04/14/22 1619 04/15/22 0503 04/15/22 1634 04/15/22 2225 04/16/22 0457  WBC 10.0 9.7 7.7  --   --  11.5*  CREATININE 0.55 0.54 0.53 0.67 0.69 0.68    Estimated Creatinine Clearance: 62.5 mL/min (by C-G formula based on SCr of 0.68 mg/dL).    Allergies  Allergen Reactions   Hydromorphone Hcl Nausea And Vomiting    Severe vomiting   Aspirin Nausea Only      Thank you for allowing pharmacy to be a part of this patient's care.  Lynelle Doctor 04/16/2022 9:25 AM

## 2022-04-16 NOTE — Telephone Encounter (Signed)
Pt called regarding the liver MRI [name] ordered prior to the pt's hospitalization.  Pt stated [employer] is calling her husband trying to schedule the pt's MRI but since the pt is in the hospital and does not know when exactly she's going to be d/c; therefore, the pt nor spouse know when to schedule the MRI.  Pt is requesting if the MRI could be done when the pt is still in the hospital.  Informed pt that this RN will notify [name] of her request to have MRI done while the pt is in the hospital.

## 2022-04-16 NOTE — Assessment & Plan Note (Addendum)
will trend

## 2022-04-16 NOTE — Assessment & Plan Note (Addendum)
Fluctuating O2 needs, 5 L recently  Most recent CXR with increasing infiltrates diffusely throughout R lung -> pneumonia vs edema, L effusion Continue daily drainage of pleurx  Follow pleural fluid studies - no growth, cytology pending Continue abx.  Suspect worsening infiltrates represent edema with worsening BNP, holding lasix since admission, no infectious symptoms at this time (afebrile, only mild leukocytosis) Lasix today 40 mg BID

## 2022-04-16 NOTE — Progress Notes (Addendum)
[facility] Progress Note  CC:  Cirrhosis   Subjective:  On O2 this morning.  Lung filled up overnight and has already been drained once this morning with a liter removed.  She says that she may need it done again later today.  Renal just saw her in consult.  Objective:  Vital signs in last 24 hours: Temp:  [98.1 F (36.7 C)-99.8 F (37.7 C)] 98.6 F (37 C) (05/19 0838) Pulse Rate:  [92-117] 110 (05/19 0854) Resp:  [18-24] 24 (05/19 0854) BP: (84-97)/(56-68) 92/62 (05/19 0854) SpO2:  [86 %-100 %] 95 % (05/19 0854) Weight:  [49.8 kg] 49.8 kg (05/19 0500) Last BM Date : 04/14/22 General:  Alert, thin, in NAD Heart:  Slightly tachy. Pulm:  Decreased BS on the left. Abdomen:  Soft, non-distended.  BS present.  Non-tender. Extremities:  Without edema. Neurologic:  Alert and oriented x 4;  grossly normal neurologically. Psych:  Alert and cooperative. Normal mood and affect.  Intake/Output from previous day: 05/18 0701 - 05/19 0700 In: 627.5 [P.O.:240; I.V.:297.3; IV Piggyback:90.2] Out: -   Lab Results: Recent Labs    04/14/22 1619 04/15/22 0503 04/16/22 0457  WBC 9.7 7.7 11.5*  HGB 12.5 11.0* 9.5*  HCT 38.7 35.0* 30.7*  PLT 125* 104* 78*   BMET Recent Labs    04/15/22 1634 04/15/22 2225 04/16/22 0457  NA 124* 127* 123*  K 3.8 4.6 4.6  CL 97* 101 99  CO2 20* 20* 18*  GLUCOSE 86 94 91  BUN '12 12 11  '$ CREATININE 0.67 0.69 0.68  CALCIUM 7.8* 7.9* 7.3*   LFT Recent Labs    04/16/22 0457  PROT 5.2*  ALBUMIN 3.1*  AST 39  ALT 20  ALKPHOS 58  BILITOT 2.1*   PT/INR Recent Labs    04/16/22 0457  LABPROT 20.2*  INR 1.7*   DG CHEST PORT 1 VIEW  Result Date: 04/16/2022 CLINICAL DATA:  Follow-up pleural effusion. EXAM: PORTABLE CHEST 1 VIEW COMPARISON:  Portable chest May 17 at 5:03 AM FINDINGS: 4:58 a.m., 04/16/2022. Left chest tube in place with tip in the apex, unchanged. There is no visible pneumothorax. There is a large layering left  pleural effusion increased in volume obscuring the lower 2/3 of the left chest. The mediastinum has shifted to the right but is stable in configuration overall. The cardiac size is normal. There is increased patchy opacity in the right lower lung field consistent with pneumonia or aspiration most likely. Right upper lung field and left apex remain clear. The right sulci are sharp. Thoracic cage intact. Scattered right chest surgical clips. IMPRESSION: 1. Large left pleural effusion, significantly increased since May 17 in spite of the chest tube placement. 2. Increased patchy consolidation right lower lung field most likely due to pneumonia or aspiration. Electronically Signed   By: Telford Nab M.D.   On: 04/16/2022 06:23   DG Chest Portable 1 View  Result Date: 04/14/2022 CLINICAL DATA:  Pleural effusion, pleural drain with large amounts of drainage history of liver tumor. EXAM: PORTABLE CHEST 1 VIEW COMPARISON:  Apr 08, 2022. FINDINGS: Trachea midline. Cardiomediastinal contours and hilar structures are stable. No visible pneumothorax with pleural drain in stable position, tip at the LEFT lung apex. Similar volume of pleural fluid when compared to the study of Apr 08, 2022. Surgical clips project over the RIGHT chest and axilla. On limited assessment there is no acute skeletal process. IMPRESSION: Stable chest x-ray. Pleural drain remains in place with  moderate LEFT-sided pleural effusion. Electronically Signed   By: Zetta Bills M.D.   On: 04/14/2022 17:17    Assessment / Plan: 16) 56 year old female with decompensated cirrhosis secondary to hepatitis C (s/p treatment with SVR) and alcohol use disorder with esophageal varices, portal hypertensive gastropathy and progressive multifocal HCC s/p microwave ablation 08/2021 and also status post yttrium-90 radioembolization to the right lobe of the liver on 12/22/2021 with delivery of a 25 mCi dose of Y-90 SIR Spheres via the right hepatic artery to treat  worsening multifocal hepatocellular carcinoma in the right lobe of the liver.  She is not a transplant candidate due to history of invasive breast cancer and multifocal HCC.  Follows with Roosevelt Locks NP at [facility]    2) Recurrent left pleural effusion, query hepatic hydrothorax. No plans for TIPS at this time, per Dr. Kathlene Cote, absolute last resort as he cannot guarantee that it will help.   3) Thrombocytopenia secondary to cirrhosis/mild splenomegaly    4) Hyponatremia and hyperkalemia:  Na+ was 119 and K+ was 6.0.  Diuretics held.  Na+ now 123 and K+ normal at 4.6 after Lokelma and NS bolus of 500 mLs.  -She has an appointment with the thoracic surgeons on 5/26.  Apparently Dr. Florene Glen has been discussing with them here. -Renal has seen her and we appreciate their help.  No plans to restart diuretics at this point.   LOS: 1 day   Laban Emperor. Zehr  04/16/2022, 8:58 AM     [facility] Attending   I have taken an interval history, reviewed the chart and examined the patient. I agree with the Advanced Practitioner's note, impression and recommendations.    Defer fluid and electrolyte management to Renal and TRH  I am ordering removal of an additional 1 L pleural fluid - increasing dyspnea  Fluid studies neg for infection so far  Signing off  Gatha Mayer, MD, [facility] See Shea Evans on call - gastroenterology for best contact person 04/16/2022 2:55 PM

## 2022-04-16 NOTE — Progress Notes (Signed)
Initial Nutrition Assessment  DOCUMENTATION CODES:   Not applicable  INTERVENTION:   -Liberalize diet to 2 gram sodium for wider variety of meal selections -[employer] Standard 1.4 TID, each supplement provides 455 kcals and 20 grams protein -MVI with minerals daily  NUTRITION DIAGNOSIS:   Increased nutrient needs related to chronic illness (cirrhosis) as evidenced by estimated needs.  GOAL:   Patient will meet greater than or equal to 90% of their needs  MONITOR:   PO intake, Supplement acceptance  REASON FOR ASSESSMENT:   Malnutrition Screening Tool    ASSESSMENT:   Pt with medical history significant for cancer of the right breast, hepatocellular carcinoma, cirrhosis, and recurrent left pleural effusion with PleurX catheter for evaluation of worsening hyponatremia.  Pt admitted with hyponatremia.   Reviewed I/O's: +628 ml x 24 hours and +1.1 L since admission   Per oncology notes, pt on tamoxifen for breast cancer.   Per GI notes, pleurex catheter has already drained 1 L today.  Pt with left pleural effusion; per MD notes, pt may need to transfer to [facility] for CVTS evaluation, however, CVTS recommending correcting hyponatremia prior to any surgical procedure.   Spoke with pt over phone, who reports feeling better today. Shsares that she has not eaten yet today, due to just drinking robitussin. Pt shares that she has had a decreased appetite, but generally consumed 2-3 times meals per day. Per pt, her diet is fairly restrictive secondary to history of IBS and lactose intolerance. She prepares one meal per week and has been relying more on takeout food secondary to generalized weakness and fatigue; frequently consumed foods include crockpot chicken and noodles and [place] food.   Per pt, has "lost a lot of weight due to my condition". She is unsure of UBW. Reviewed wt hx; no wt loss noted over the past 3 months. Noted discrepancy of hospital weight (39.9 kg admission wt vs  49.8 kg today).   Pt is at high risk for malnutrition, however, unable to identify at this time. Discussed importance of good meal and supplement intake to promote healing. Pt shares that she does not tolerate Boost or Ensure supplements well, but drinks an organic protein powder that she mixes with lactose free milk. She is amenable to try [employer] supplements.   Medications reviewed and include IV albumin.   Labs reviewed: Na: 125. CBGS: 111.    Diet Order:   Diet Order             Diet 2 gram sodium Room service appropriate? Yes; Fluid consistency: Thin  Diet effective now                   EDUCATION NEEDS:   Education needs have been addressed  Skin:  Skin Assessment: Reviewed RN Assessment  Last BM:  04/14/22  Height:   Ht Readings from Last 1 Encounters:  04/14/22 '5\' 3"'$  (1.6 m)    Weight:   Wt Readings from Last 1 Encounters:  04/16/22 49.8 kg    Ideal Body Weight:  52.3 kg  BMI:  Body mass index is 19.45 kg/m.  Estimated Nutritional Needs:   Kcal:  1800-2000  Protein:  105-120 grams  Fluid:  > 1.8 L    Loistine Chance, RD, LDN, [place] Registered Dietitian II Certified Diabetes Care and Education Specialist Please refer to [facility] for RD and/or RD on-call/weekend/after hours pager

## 2022-04-16 NOTE — Progress Notes (Signed)
PROGRESS NOTE    Theresa Rocha  SVX:[id number] DOB: 1966/06/10 DOA: 04/14/2022 PCP: Seward Carol, MD  Chief Complaint  Patient presents with   abnormal labs    Brief Narrative:   Theresa Rocha is Theresa Rocha pleasant 56 y.o. female with medical history significant for cancer of the right breast, hepatocellular carcinoma, cirrhosis, and recurrent left pleural effusion with PleurX catheter now presenting to the emergency department for evaluation of worsening hyponatremia.  Patient reports that she has been holding her diuretics since 04/08/2022, had outpatient blood work performed today, and was notified that her sodium had decreased to 119.  She was advised to seek further evaluation in the ED.  She has not noticed any acute change in her chronic fatigue and general weakness, denies headache, denies nausea or vomiting, and denies change in her chronic loose stools.  She denies any ankle swelling.  She sometimes develops abdominal distention but none now.  She reports worsening weight loss in the past few weeks.  Denies fevers or chills.  Denies abdominal pain.   ED Course: Upon arrival to the ED, patient is found to be afebrile and saturating well on room air with systolic blood pressure in the 90s.  Chemistry panel notable for sodium of 124 and potassium 6.0.  Patient was given Lokelma and 500 cc normal saline bolus in the ED.    Assessment & Plan:   Principal Problem:   Hyponatremia Active Problems:   Hyperkalemia   Cirrhosis (HCC)   Pleural effusion on left   Hypotension   Acute on chronic respiratory failure with hypoxia (HCC)   Hepatocellular carcinoma (HCC)   Thrombocytopenia (HCC)   Anemia   Breast cancer (HCC)   Assessment and Plan: * Hyponatremia Suspected hypovolemic related to diuretics and daily drainage from pleurx  Urine sodium <10 Net positive 1.127 L.  Sodium had improved to 127 with IVF, now worse this AM to 123 -> holding IVF right now with worsening effusion this AM, ~1 L  removed via pleurx this AM recheck urine sodium and urine osm   Renal has been c/s, appreciate assistance with volume, hyponatremia, lytes Am cortisol ~10, follow ACTH stim Follow TSH, Follow BNP  Hyperkalemia S/p lokelma IVF on hold improved Follow ACTH stim   Acute on chronic respiratory failure with hypoxia (HCC) Required up to 5 L this AM Related to large L effusion as well as patchy consolidation in RLL field (pneumonia vs aspiration) S/p drainage of pleurx this AM with improvement in symptoms Follow pleural fluid studies  Will start abx this AM for possible pneumonia (worsening leukocytosis as well as CXR findings).  Follow BNP. Repeat CXR in am   Hypotension Related to cirrhosis and suspected hypovolemia S/p albumin IVF on hold midodrine   Pleural effusion on left Per Dr. 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 had reached out to CT surgery regarding possibility of pleurodesis (appt scheduled 5/26) Echo 10/2021 with normal EF, normal diastolic parameters CXR 2/33 with large L effusion -> now s/p drainage of 1 L by RN this AM (5/19)  Will repeat CXR 5/20 Follow pending cytology, gram stain/culture, cell count, LDH Dr. Kipp Brood doesn't round at [place], but recommends hyponatremia be corrected before any surgical procedure.  Ultimately, will probably need to be  transferred to [facility] at some point for CT surgery evaluation, will try to clarify with CT surgery.  Cirrhosis ([place]) Related to hepatitis C and etoh use disorder  Complicated by esophageal varices, portal hypertensive gastropathy, and progressive multifocal HCC s/p microwave ablation 08/2021, s/p yttium-90 radioembolization to R lobe of liver on 1/24 Appreciate GI assistance, follows  with Roosevelt Locks NP at [facility]  Anemia Component of dilution with IVF earlier? Continue to trend  Thrombocytopenia (HCC) Chronic, but worse today Again, consider dilution with IVF Will continue to trend  Breast cancer ([place]) Follows with [name]      DVT prophylaxis: lovenox Code Status: full Family Communication: none Disposition:   Status is: inpatient The patient will require care spanning > 2 midnights and should be moved to inpatient because: need for GI, renal, CT surgery, oncology c/s   Consultants:  GI Renal Oncology CT surgery  Procedures:  none  Antimicrobials:  Anti-infectives (From admission, onward)    None       Subjective: C/o SOB/cough last night Improved after pleurx drainage  Objective: Vitals:   04/16/22 0447 04/16/22 0500 04/16/22 0838 04/16/22 0854  BP: 97/61  (!) 87/56 92/62  Pulse: (!) 114  (!) 110 (!) 110  Resp: 20  (!) 24 (!) 24  Temp: 99.8 F (37.7 C)  98.6 F (37 C)   TempSrc: Oral  Oral   SpO2: 91%  91% 95%  Weight:  49.8 kg    Height:        Intake/Output Summary (Last 24 hours) at 04/16/2022 0931 Last data filed at 04/15/2022 1617 Gross per 24 hour  Intake 507.53 ml  Output --  Net 507.53 ml   Filed Weights   04/14/22 1547 04/15/22 0500 04/16/22 0500  Weight: 39.9 kg 43.6 kg 49.8 kg    Examination:  General: No acute distress. Cardiovascular: RRR Lungs: L sided pleurx, unlabored with Theresa Rocha in place Abdomen: Soft, nontender, nondistended Neurological: Alert and oriented 3. Moves all extremities 4 with equal strength. Cranial nerves II through XII grossly intact. Extremities: no LEE   Data Reviewed: I have personally reviewed following labs and imaging studies  CBC: Recent Labs  Lab 04/14/22 1256 04/14/22 1619 04/15/22 0503 04/16/22 0457  WBC 10.0 9.7 7.7 11.5*  NEUTROABS 7.8* 7.2  --  9.8*  HGB 12.3 12.5 11.0* 9.5*  HCT 37.4 38.7 35.0* 30.7*  MCV 76.0* 77.7* 78.8*  80.4  PLT 125* 125* 104* 78*    Basic Metabolic Panel: Recent Labs  Lab 04/14/22 1619 04/15/22 0503 04/15/22 1634 04/15/22 2225 04/16/22 0457  NA 124* 123* 124* 127* 123*  K 6.0* 4.6 3.8 4.6 4.6  CL 97* 98 97* 101 99  CO2 21* 20* 20* 20* 18*  GLUCOSE 94 118* 86 94 91  BUN '13 14 12 12 11  '$ CREATININE 0.54 0.53 0.67 0.69 0.68  CALCIUM 8.4* 7.7* 7.8* 7.9* 7.3*  MG 2.1  --   --   --  1.7  PHOS  --   --   --  3.0 2.6    GFR: Estimated Creatinine Clearance: 62.5 mL/min (by C-G formula based on SCr of 0.68 mg/dL).  Liver Function Tests: Recent Labs  Lab 04/14/22 1256 04/14/22 1619 04/15/22 0503 04/16/22 0457  AST 57* 61* 50* 39  ALT 27 32 28 20  ALKPHOS 96 95 78 58  BILITOT 2.2* 2.1* 1.9* 2.1*  PROT 6.2* 6.3* 5.3* 5.2*  ALBUMIN 2.8* 2.6* 2.2* 3.1*  CBG: No results for input(s): GLUCAP in the last 168 hours.   No results found for this or any previous visit (from the past 240 hour(s)).       Radiology Studies: DG CHEST PORT 1 VIEW  Result Date: 04/16/2022 CLINICAL DATA:  Follow-up pleural effusion. EXAM: PORTABLE CHEST 1 VIEW COMPARISON:  Portable chest May 17 at 5:03 AM FINDINGS: 4:58 Theresa Rocha.m., 04/16/2022. Left chest tube in place with tip in the apex, unchanged. There is no visible pneumothorax. There is Cecely Rengel large layering left pleural effusion increased in volume obscuring the lower 2/3 of the left chest. The mediastinum has shifted to the right but is stable in configuration overall. The cardiac size is normal. There is increased patchy opacity in the right lower lung field consistent with pneumonia or aspiration most likely. Right upper lung field and left apex remain clear. The right sulci are sharp. Thoracic cage intact. Scattered right chest surgical clips. IMPRESSION: 1. Large left pleural effusion, significantly increased since May 17 in spite of the chest tube placement. 2. Increased patchy consolidation right lower lung field most likely due to pneumonia or  aspiration. Electronically Signed   By: Telford Nab M.D.   On: 04/16/2022 06:23   DG Chest Portable 1 View  Result Date: 04/14/2022 CLINICAL DATA:  Pleural effusion, pleural drain with large amounts of drainage history of liver tumor. EXAM: PORTABLE CHEST 1 VIEW COMPARISON:  Apr 08, 2022. FINDINGS: Trachea midline. Cardiomediastinal contours and hilar structures are stable. No visible pneumothorax with pleural drain in stable position, tip at the LEFT lung apex. Similar volume of pleural fluid when compared to the study of Apr 08, 2022. Surgical clips project over the RIGHT chest and axilla. On limited assessment there is no acute skeletal process. IMPRESSION: Stable chest x-ray. Pleural drain remains in place with moderate LEFT-sided pleural effusion. Electronically Signed   By: Zetta Bills M.D.   On: 04/14/2022 17:17        Scheduled Meds:  enoxaparin (LOVENOX) injection  30 mg Subcutaneous Q24H   midodrine  12.5 mg Oral TID WC   nicotine  21 mg Transdermal Daily   pantoprazole  40 mg Oral Daily   sodium chloride flush  3 mL Intravenous Q12H   tamoxifen  20 mg Oral Daily   Continuous Infusions:     LOS: 1 day    Time spent: over 30 min    Fayrene Helper, MD [facility]   To contact the attending provider between 7A-7P or the covering provider during after hours 7P-7A, please log into the web site [email or web address] and access using universal [place] password for that web site. If you do not have the password, please call the Rocha operator.  04/16/2022, 9:31 AM

## 2022-04-16 NOTE — Progress Notes (Addendum)
Per Dr. Florene Glen, wait to drain next liter from pleurex drain after SBP is >90. BP has been steadily 00'F systolic. Will monitor hourly.

## 2022-04-16 NOTE — Consult Note (Addendum)
Renal Service Consult Note [facility]  Theresa Rocha 04/16/2022 Sol Blazing, MD Requesting Physician: Dr. Florene Glen  Reason for Consult: Hyponatremia HPI: The patient is a 56 y.o. year-old w/ hx of breast cancer, COPD, cirrhosis, hx hep C, hx portal HTN/ esophageal varices, hx HCC, hx recurrent L pleural effusion w/ PleurX catheter in place, hx of Raynaud's who presented 5/17 for assessment of labs (Na+ low) drawn same day as outpatient. Pt was holding her diuretics since 5/11 and had blood work done 5/17 which showed Na+ of 119. Sent to ED where Na+ was 124, K+ 6.0, creat wnl. Pt rec'd IV NS and lokelma and was admitted. F/u Na+ labs here were 127 yesterday and 123 this am. Asked to see for hyponatremia.   Pt seen in room.  BP's are soft in the 80's to 90's. MAP of 68- 78. Started on midodrine 5 tid here. Getting NS at 125 cc/hr overnight, also IV albumin 25 gm q 6 hrs. SP 500 cc NS bolus.   Pt seen in room w/ husband. This per the pt >>  she was dx'd and treated for hep C many yrs ago. Then in 2020 they found spots on her liver [facility]) and is f/b Dr Kathlene Cote w/ IR for this. They did microwave ablation once the lesions started growing, around October 2022. More recently they did radiation bead injected into liver. Around that time she developed recurrent L pleural effusions which didn't respond to treatments so 1 month ago they placed a pleurex catheter which has helped the breathing a lot.  She had ascites only 1 time around the time of her Turney diagnosis, they did 3L paracentesis and she hasn't had ascites again. Pt is f/b GI for esophageal varices. Had UGIB episode Nov 2022 and the varices were banded. The pt was going to be on transplant list then was dx'd with breast cancer and had lumpectomy in Nov 2022 and is not a transplant candidate anymore.   Recently pt states her SOB is worse. Denies cough, reflux , hx of pna. She was getting po diuretics for several mos but they didn't  seem to work well lately in that they didn't make her void a lot and her breathing didn't better.     ROS - denies CP, no joint pain, no HA, no blurry vision, no rash, no diarrhea, no nausea/ vomiting, no dysuria, no difficulty voiding   Past Medical History  Past Medical History:  Diagnosis Date   Anemia    Breast cancer ([place]) 08/30/2019   Cancer ([place])    liver- just diagnosed, not started treatment yet   Cervicalgia    COPD (chronic obstructive pulmonary disease) (HCC)    Esophageal varices (HCC)    GERD (gastroesophageal reflux disease)    Headache    Hepatic cirrhosis (HCC)    Hepatitis C    resolved 2018   Hepatocellular carcinoma (HCC)    IBS (irritable bowel syndrome)    Lactose intolerance    Personal history of radiation therapy 11/2019   Right breast   Portal hypertension (HCC)    Raynaud's syndrome    Thrombocytopenia ([place])    Past Surgical History  Past Surgical History:  Procedure Laterality Date   BREAST BIOPSY Right 09/2019   BREAST LUMPECTOMY Right 09/2019   BREAST LUMPECTOMY WITH AXILLARY LYMPH NODE BIOPSY Right 10/16/2019   Procedure: RIGHT BREAST LUMPECTOMY WITH SENTINEL LYMPH NODE BIOPSY;  Surgeon: Stark Klein, MD;  Location: [facility];  Service: General;  Laterality: Right;   COLONOSCOPY     ESOPHAGEAL BANDING  10/27/2021   Procedure: ESOPHAGEAL BANDING;  Surgeon: Irene Shipper, MD;  Location: [facility];  Service: Endoscopy;;   ESOPHAGOGASTRODUODENOSCOPY (EGD) WITH PROPOFOL N/A 10/27/2021   Procedure: ESOPHAGOGASTRODUODENOSCOPY (EGD) WITH PROPOFOL;  Surgeon: Irene Shipper, MD;  Location: [facility];  Service: Endoscopy;  Laterality: N/A;   ESOPHAGOGASTRODUODENOSCOPY (EGD) WITH PROPOFOL N/A 01/04/2022   Procedure: ESOPHAGOGASTRODUODENOSCOPY (EGD) WITH PROPOFOL;  Surgeon: Irene Shipper, MD;  Location: [facility];  Service: Endoscopy;  Laterality: N/A;   HAMMER TOE SURGERY Left 2012   pins placed in great toe, 2nd,3rd, 4th toes, all pins removed  except great toe   IR ANGIOGRAM SELECTIVE EACH ADDITIONAL VESSEL  12/16/2021   IR ANGIOGRAM SELECTIVE EACH ADDITIONAL VESSEL  12/16/2021   IR ANGIOGRAM SELECTIVE EACH ADDITIONAL VESSEL  12/22/2021   IR ANGIOGRAM VISCERAL SELECTIVE  12/16/2021   IR ANGIOGRAM VISCERAL SELECTIVE  12/22/2021   IR EMBO TUMOR ORGAN ISCHEMIA INFARCT INC GUIDE ROADMAPPING  12/22/2021   IR PARACENTESIS  11/13/2019   IR PARACENTESIS  03/04/2022   IR PERC PLEURAL DRAIN W/INDWELL CATH W/IMG GUIDE  03/09/2022   IR RADIOLOGIST EVAL & MGMT  08/16/2019   IR RADIOLOGIST EVAL & MGMT  02/06/2020   IR RADIOLOGIST EVAL & MGMT  06/26/2020   IR RADIOLOGIST EVAL & MGMT  12/31/2020   IR RADIOLOGIST EVAL & MGMT  07/21/2021   IR RADIOLOGIST EVAL & MGMT  10/08/2021   IR RADIOLOGIST EVAL & MGMT  01/13/2022   IR RADIOLOGIST EVAL & MGMT  03/05/2022   IR THORACENTESIS ASP PLEURAL SPACE W/IMG GUIDE  10/12/2021   IR THORACENTESIS ASP PLEURAL SPACE W/IMG GUIDE  10/26/2021   IR THORACENTESIS ASP PLEURAL SPACE W/IMG GUIDE  11/19/2021   IR THORACENTESIS ASP PLEURAL SPACE W/IMG GUIDE  12/22/2021   IR US GUIDE VASC ACCESS RIGHT  12/16/2021   IR US GUIDE VASC ACCESS RIGHT  12/22/2021   PELVIC LAPAROSCOPY Bilateral 1992   RADIOLOGY WITH ANESTHESIA N/A 09/16/2021   Procedure: CT MICROWAVE ABLATION;  Surgeon: Aletta Edouard, MD;  Location: [facility];  Service: Radiology;  Laterality: N/A;   UPPER GASTROINTESTINAL ENDOSCOPY  2002   had esophageal dilitation   Family History  Family History  Problem Relation Age of Onset   Liver cancer Mother    Lung cancer Mother    Lung cancer Father    Liver cancer Maternal Grandmother    Colon cancer Neg Hx    Esophageal cancer Neg Hx    Stomach cancer Neg Hx    Rectal cancer Neg Hx    Social History  reports that she has been smoking cigarettes. She started smoking about 43 years ago. She has a 15.00 pack-year smoking history. She has never used smokeless tobacco. She reports that she does not currently use alcohol after  a past usage of about 6.0 standard drinks per week. She reports that she does not use drugs. Allergies  Allergies  Allergen Reactions   Hydromorphone Hcl Nausea And Vomiting    Severe vomiting   Aspirin Nausea Only   Home medications Prior to Admission medications   Medication Sig Start Date End Date Taking? Authorizing Provider  baclofen (LIORESAL) 10 MG tablet Take 5 mg by mouth daily as needed for muscle spasms. 03/12/22  Yes [provider]  dicyclomine (BENTYL) 20 MG tablet Take 0.5 tablets (10 mg total) by mouth every 6 (six) hours as needed for spasms. Take one tablet every  6-8 hours as needed for abdominal cramping Patient taking differently: Take 20 mg by mouth daily as needed (abdominal cramping). 01/07/22  Yes Noralyn Pick, NP  loperamide (IMODIUM) 2 MG capsule Take 4 mg by mouth daily as needed for diarrhea or loose stools.   Yes [provider]  nicotine (NICODERM CQ - DOSED IN MG/24 HOURS) 21 mg/24hr patch Place 21 mg onto the skin daily as needed (smoking cessation on work days).   Yes [provider]  oxyCODONE (OXY IR/ROXICODONE) 5 MG immediate release tablet Take 1-2 tablets (5-10 mg total) by mouth every 4 (four) hours as needed for severe pain. 03/15/22  Yes Truitt Merle, MD  pantoprazole (PROTONIX) 40 MG tablet Take 1 tablet (40 mg total) by mouth daily. 12/01/21  Yes Noralyn Pick, NP  promethazine (PHENERGAN) 12.5 MG tablet Take 1 tablet (12.5 mg total) by mouth every 6 (six) hours as needed for nausea or vomiting. 03/10/22  Yes Han, Aimee H, PA-C  tamoxifen (NOLVADEX) 20 MG tablet TAKE 1 TABLET BY MOUTH EVERY DAY Patient taking differently: Take 20 mg by mouth every evening. 10/13/20  Yes Truitt Merle, MD  docusate sodium (COLACE) 100 MG capsule Take 1 capsule (100 mg total) by mouth 2 (two) times daily. Patient not taking: Reported on 04/14/2022 03/10/22   Han, Aimee H, PA-C  furosemide (LASIX) 40 MG tablet Take 1 tablet (40 mg total)  by mouth daily. Patient not taking: Reported on 04/14/2022 02/23/22   Georgette Shell, MD  lidocaine (LIDODERM) 5 % Place 2 patches onto the skin daily. Remove & Discard patch within 12 hours or as directed by MD Patient not taking: Reported on 04/14/2022 02/23/22   Georgette Shell, MD  potassium chloride SA (KLOR-CON M) 20 MEQ tablet Take 1 tablet (20 mEq total) by mouth daily. Patient not taking: Reported on 04/14/2022 02/23/22   Georgette Shell, MD     Vitals:   04/16/22 1638 04/16/22 0353 04/16/22 0447 04/16/22 0500  BP: 95/68 (!) 94/56 97/61   Pulse: (!) 116 (!) 117 (!) 114   Resp: (!) 22 (!) 22 20   Temp: 99.2 F (37.3 C) 99.4 F (37.4 C) 99.8 F (37.7 C)   TempSrc: Oral Oral Oral   SpO2: 90% 90% 91%   Weight:    49.8 kg  Height:       Exam Gen alert, no distress, thin pleasant WF No rash, cyanosis or gangrene Sclera anicteric, throat clear  No jvd or bruits Chest R bases coarse rales mild, L base dec'd BS L chest Pleurx tube RRR no RG Abd soft ntnd no mass or ascites +bs GU defer MS no joint effusions or deformity Ext no LE or UE edema, no wounds or ulcers Neuro is alert, Ox 3 , nf      Home meds include - baclofen, bentyl, im0dium, nicotine patch, oxy IR, protonix, phenergan, tamoxifen, colace, lasix 40 qd, kdur 20 qd, lidocaine patches   Temp 99.4  BP 90/ 60  WBC 11.5  RR 20-27  HR 96- 117  Martin O2 91 %   Na 123  K 4.6  CO2 18  BUN 11  Creat 0.68  Hb 9.5  plt 78   CXR 5/19 - IMPRESSION: 1. Large left pleural effusion, significantly increased since May 17 in spite of the chest tube placement. 2. Increased patchy consolidation right lower lung field most likely due to pneumonia or aspiration.    Assessment/ Plan: Hyponatremia - in a  pt w/ cirrhosis and portal HTN. Pt is not vol overloaded on exam. Treatment of hyponatremia in cirrhotics is unique in that the main goal is to improve blood pressures, which should improve renal perfusion and therefore water  clearance. Does not respond to saline. Have dc'd NS. Will ^ midodrine to 12.5 bid, goal MAP is > 82. Cont IV albumin, getting 25 gm qid. Avoid diuretics/ BP lowering meds. If Na+ goes below 120 would use 3% saline.  Cirrhosis - no ascites, normal renal function.  Recurrent L pleural effusion - has L Pleurx placed 1 month ago Hypotension - as above. Hopefully will respond to alb/ midodrine HCC - sp radiation implant H/o breast cancer - rx'd lumpectomy in 2020      Rob Towanda Hornstein  MD 04/16/2022, 8:20 AM Recent Labs  Lab 04/15/22 0503 04/15/22 1634 04/15/22 2225 04/16/22 0457  HGB 11.0*  --   --  9.5*  ALBUMIN 2.2*  --   --  3.1*  CALCIUM 7.7*   < > 7.9* 7.3*  PHOS  --   --  3.0 2.6  CREATININE 0.53   < > 0.69 0.68  K 4.6   < > 4.6 4.6   < > = values in this interval not displayed.

## 2022-04-16 NOTE — Assessment & Plan Note (Addendum)
Will continue to monitor.

## 2022-04-16 NOTE — TOC Initial Note (Signed)
Transition of Care [facility]) - Initial/Assessment Note    Patient Details  Name: Theresa Rocha MRN: [id number] Date of Birth: [birth year]  Transition of Care [facility]) CM/SW Contact:    Leeroy Cha, RN Phone Number: 04/16/2022, 10:06 AM  Clinical Narrative:                  Transition of Care [facility]) Screening Note   Patient Details  Name: Theresa Rocha Date of Birth: 06/17/1966   Transition of Care [facility]) CM/SW Contact:    Leeroy Cha, RN Phone Number: 04/16/2022, 10:06 AM    Transition of Care Department (TOC) has reviewed patient and no TOC needs have been identified at this time. We will continue to monitor patient advancement through interdisciplinary progression rounds. If new patient transition needs arise, please place a TOC consult.    Expected Discharge Plan: Home/Self Care Barriers to Discharge: No Barriers Identified   Patient Goals and CMS Choice Patient states their goals for this hospitalization and ongoing recovery are:: to go home CMS Medicare.gov Compare Post Acute Care list provided to:: Patient    Expected Discharge Plan and Services Expected Discharge Plan: Home/Self Care   Discharge Planning Services: CM Consult   Living arrangements for the past 2 months: Single Family Home                                      Prior Living Arrangements/Services Living arrangements for the past 2 months: Single Family Home Lives with:: Spouse Patient language and need for interpreter reviewed:: Yes Do you feel safe going back to the place where you live?: Yes            Criminal Activity/Legal Involvement Pertinent to Current Situation/Hospitalization: No - Comment as needed  Activities of Daily Living Home Assistive Devices/Equipment: Other (Comment), Eyeglasses (pluerx catheter, reading glasses) ADL Screening (condition at time of admission) Patient's cognitive ability adequate to safely complete daily activities?: Yes Is the patient deaf  or have difficulty hearing?: No Does the patient have difficulty seeing, even when wearing glasses/contacts?: No Does the patient have difficulty concentrating, remembering, or making decisions?: No Patient able to express need for assistance with ADLs?: Yes Does the patient have difficulty dressing or bathing?: No Independently performs ADLs?: Yes (appropriate for developmental age) Does the patient have difficulty walking or climbing stairs?: Yes (gets sob) Weakness of Legs: Both Weakness of Arms/Hands: Both  Permission Sought/Granted                  Emotional Assessment Appearance:: Appears stated age     Orientation: : Oriented to Self, Oriented to Place, Oriented to  Time, Oriented to Situation Alcohol / Substance Use: Not Applicable Psych Involvement: No (comment)  Admission diagnosis:  Hyperkalemia [E87.5] Hepatocellular carcinoma ([place]) [C22.0] Hyponatremia [E87.1] Patient Active Problem List   Diagnosis Date Noted   Acute on chronic respiratory failure with hypoxia ([place]) 04/16/2022   Anemia 04/16/2022   Hyperkalemia 04/14/2022   Recurrent left pleural effusion 02/21/2022   Nicotine dependence 02/21/2022   Hyponatremia 02/21/2022   Mild protein malnutrition ([place]) 02/21/2022   Hyperbilirubinemia 02/21/2022   SIRS (systemic inflammatory response syndrome) ([place]) 10/30/2021   Hypotension 10/30/2021   Hematemesis with nausea    Bowel wall thickening 33/29/5188   Metabolic acidosis 41/66/0630   Secondary esophageal varices with bleeding ([place]) 10/27/2021   Acute blood loss anemia 10/27/2021   Pleural  effusion on left 10/27/2021   Pleural effusion 09/30/2021   Iron deficiency anemia due to chronic blood loss 09/30/2021   Hypokalemia 09/30/2021   Prolonged QT interval 09/30/2021   Thrombocytopenia ([place])    Bunion 07/10/2021   Malignant neoplasm of central portion of right breast in female, estrogen receptor positive ([place]) 09/05/2019   Breast cancer ([place])  08/19/2019   Hepatocellular carcinoma ([place]) 08/02/2019   Ascites due to alcoholic hepatitis 36/14/4315   Liver masses 07/26/2019   Cirrhosis ([place]) 08/11/2017   Acute right ankle pain 08/18/2016   Mass of right ankle 08/18/2016   Right foot pain 08/18/2016   HEMORRHOIDS, INTERNAL 09/09/2010   IBS 09/09/2010   Chronic hepatitis C without hepatic coma ([place]) 06/19/2010   GERD 06/19/2010   DIARRHEA 06/19/2010   ABDOMINAL PAIN -GENERALIZED 06/19/2010   PCP:  Seward Carol, MD Pharmacy:   [id number] [place], [place] [id number]: [phone]: [phone]    Social Determinants of Health (SDOH) Interventions    Readmission Risk Interventions     View : No data to display.

## 2022-04-17 ENCOUNTER — Inpatient Hospital Stay: Payer: Self-pay

## 2022-04-17 ENCOUNTER — Inpatient Hospital Stay (HOSPITAL_COMMUNITY): Payer: BC Managed Care – PPO

## 2022-04-17 DIAGNOSIS — E871 Hypo-osmolality and hyponatremia: Secondary | ICD-10-CM | POA: Diagnosis not present

## 2022-04-17 LAB — COMPREHENSIVE METABOLIC PANEL
ALT: 18 U/L (ref 0–44)
AST: 31 U/L (ref 15–41)
Albumin: 3.4 g/dL — ABNORMAL LOW (ref 3.5–5.0)
Alkaline Phosphatase: 48 U/L (ref 38–126)
Anion gap: 8 (ref 5–15)
BUN: 11 mg/dL (ref 6–20)
CO2: 19 mmol/L — ABNORMAL LOW (ref 22–32)
Calcium: 8.2 mg/dL — ABNORMAL LOW (ref 8.9–10.3)
Chloride: 103 mmol/L (ref 98–111)
Creatinine, Ser: 0.59 mg/dL (ref 0.44–1.00)
GFR, Estimated: >60 mL/min (ref >60)
Glucose, Bld: 79 mg/dL (ref 70–99)
Potassium: 3.8 mmol/L (ref 3.5–5.1)
Sodium: 130 mmol/L — ABNORMAL LOW (ref 135–145)
Total Bilirubin: 2.5 mg/dL — ABNORMAL HIGH (ref 0.3–1.2)
Total Protein: 5.4 g/dL — ABNORMAL LOW (ref 6.5–8.1)

## 2022-04-17 LAB — PHOSPHORUS: Phosphorus: 2.5 mg/dL (ref 2.5–4.6)

## 2022-04-17 LAB — BRAIN NATRIURETIC PEPTIDE: B Natriuretic Peptide: 564 pg/mL — ABNORMAL HIGH (ref 0.0–100.0)

## 2022-04-17 MED ORDER — SODIUM CHLORIDE 0.9% FLUSH
10.0000 mL | Freq: Two times a day (BID) | INTRAVENOUS | Status: DC
  Administered 2022-04-18 – 2022-04-28 (×19): 10 mL
  Administered 2022-04-28: 20 mL
  Administered 2022-04-29: 10 mL
  Administered 2022-04-29: 20 mL
  Administered 2022-04-30 – 2022-05-02 (×4): 10 mL

## 2022-04-17 MED ORDER — SODIUM CHLORIDE 0.9% FLUSH: 10.0000 mL | INTRAVENOUS | Status: DC | PRN

## 2022-04-17 MED ORDER — PROCHLORPERAZINE EDISYLATE 10 MG/2ML IJ SOLN
5.0000 mg | Freq: Once | INTRAMUSCULAR | Status: AC
  Administered 2022-04-17: 5 mg via INTRAVENOUS
  Filled 2022-04-17: qty 2

## 2022-04-17 MED ORDER — CHLORHEXIDINE GLUCONATE CLOTH 2 % EX PADS
6.0000 | MEDICATED_PAD | Freq: Every day | CUTANEOUS | Status: DC
  Administered 2022-04-17 – 2022-05-02 (×15): 6 via TOPICAL

## 2022-04-17 NOTE — Progress Notes (Signed)
PROGRESS NOTE    Theresa Rocha  HMC:[id number] DOB: 11-Feb-1966 DOA: 04/14/2022 PCP: Seward Carol, MD  Chief Complaint  Patient presents with   abnormal labs    Brief Narrative:   Theresa Rocha is Theresa Rocha pleasant 56 y.o. female with medical history significant for cancer of the right breast, hepatocellular carcinoma, cirrhosis, and recurrent left pleural effusion with PleurX catheter now presenting to the emergency department for evaluation of worsening hyponatremia.  Patient reports that she has been holding her diuretics since 04/08/2022, had outpatient blood work performed today, and was notified that her sodium had decreased to 119.  She was advised to seek further evaluation in the ED.  She has not noticed any acute change in her chronic fatigue and general weakness, denies headache, denies nausea or vomiting, and denies change in her chronic loose stools.  She denies any ankle swelling.  She sometimes develops abdominal distention but none now.  She reports worsening weight loss in the past few weeks.  Denies fevers or chills.  Denies abdominal pain.   ED Course: Upon arrival to the ED, patient is found to be afebrile and saturating well on room air with systolic blood pressure in the 90s.  Chemistry panel notable for sodium of 124 and potassium 6.0.  Patient was given Lokelma and 500 cc normal saline bolus in the ED.    Assessment & Plan:   Principal Problem:   Hyponatremia Active Problems:   Hyperkalemia   Decompensated HCV cirrhosis (HCC)   Pleural effusion on left   Hypotension   Acute on chronic respiratory failure with hypoxia (HCC)   Hepatocellular carcinoma (HCC)   Thrombocytopenia (HCC)   Anemia   Breast cancer (HCC)   Assessment and Plan: * Hyponatremia Suspected hypovolemic related to diuretics and daily drainage from pleurx  Urine sodium remains <10 Net positive 1.127 L.  Sodium had improved to 127 with IVF, now worse this AM to 123 -> holding IVF right now with  worsening effusion this AM, ~1 L removed via pleurx this AM Renal has been c/s, appreciate assistanc e with volume, hyponatremia, lytes -> recommending midodrine and albumin with goal MAP >82 Am cortisol ~10, follow ACTH stim (normal) Follow TSH (wnl), Follow BNP elevated  Hyperkalemia S/p lokelma improved   Acute on chronic respiratory failure with hypoxia (HCC) Fluctuating O2 needs, 4 L recently  Related to large L effusion as well as patchy consolidation in RLL field (pneumonia vs aspiration) CXR 5/20 with infiltrate in R mid lower lung mildly worsened, smaller L effusion S/p drainage of pleurx this AM with improvement in symptoms Follow pleural fluid studies  Continue abx Trend CXR   Hypotension Related to cirrhosis and suspected hypovolemia S/p albumin IVF on hold midodrine   Pleural effusion on left Per Dr. 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 had reached out to CT surgery regarding possibility of pleurodesis (appt scheduled 5/26) Echo 10/2021 with normal EF, normal diastolic parameters CXR 6/62 with large L effusion -> now s/p drainage of 1 L by RN this AM (5/19)  Repeat drainage 5/20 1 L She's SOB again, draining 1 L again 5/20 PM Follow pending cytology, gram stain/culture - no growth, cell count - 32% neutrophils, LDH - transudate, no fluid protein done. Will continue  to discuss with CT surgery  Decompensated HCV cirrhosis ([place]) Related to hepatitis C and etoh use disorder  Complicated by esophageal varices, portal hypertensive gastropathy, and progressive multifocal HCC s/p microwave ablation 08/2021, s/p yttium-90 radioembolization to R lobe of liver on 1/24 Appreciate GI assistance, follows with Roosevelt Locks NP at [facility]  Anemia No CBC today, will trend  Thrombocytopenia ([place]) Will continue to monitor (no CBC today)  Breast cancer ([place]) Follows with [name]      DVT prophylaxis: lovenox Code Status: full Family Communication: none Disposition:   Status is: inpatient The patient will require care spanning > 2 midnights and should be moved to inpatient because: need for GI, renal, CT surgery, oncology c/s   Consultants:  GI Renal Oncology CT surgery  Procedures:  none  Antimicrobials:  Anti-infectives (From admission, onward)    Start     Dose/Rate Route Frequency Ordered Stop   04/16/22 1800  Ampicillin-Sulbactam (UNASYN) 3 g in sodium chloride 0.9 % 100 mL IVPB        3 g 200 mL/hr over 30 Minutes Intravenous Every 6 hours 04/16/22 1139     04/16/22 1000  Ampicillin-Sulbactam (UNASYN) 3 g in sodium chloride 0.9 % 100 mL IVPB  Status:  Discontinued        3 g 200 mL/hr over 30 Minutes Intravenous Every 6 hours 04/16/22 0935 04/16/22 1139       Subjective: No new complaints  Objective: Vitals:   04/17/22 0900 04/17/22 1000 04/17/22 1100 04/17/22 1200  BP: 105/71 (!) '89/53 93/62 94/66 '$  Pulse: (!) 105 (!) 104 (!) 109 97  Resp: (!) 31 (!) 28 (!) 29 (!) 24  Temp:      TempSrc:      SpO2: 95% 93% 91% 94%  Weight:      Height:        Intake/Output Summary (Last 24 hours) at 04/17/2022 1638 Last data filed at 04/17/2022 1500 Gross per 24 hour  Intake 662.89 ml  Output 1290 ml  Net -627.11 ml   Filed Weights   04/14/22 1547 04/15/22 0500 04/16/22 0500  Weight: 39.9 kg 43.6 kg 49.8 kg    Examination:  General: No acute distress. Cardiovascular: RRR Lungs: diminished, L pleurx Abdomen: Soft, nontender, nondistended Neurological: Alert and oriented 3. Moves all extremities 4 with equal strength. Cranial nerves II through XII grossly intact. Extremities: No clubbing or cyanosis. No edema.   Data Reviewed: I have personally reviewed  following labs and imaging studies  CBC: Recent Labs  Lab 04/14/22 1256 04/14/22 1619 04/15/22 0503 04/16/22 0457  WBC 10.0 9.7 7.7 11.5*  NEUTROABS 7.8* 7.2  --  9.8*  HGB 12.3 12.5 11.0* 9.5*  HCT 37.4 38.7 35.0* 30.7*  MCV 76.0* 77.7* 78.8* 80.4  PLT 125* 125* 104* 78*    Basic Metabolic Panel: Recent Labs  Lab 04/14/22 1619 04/15/22 0503 04/15/22 1634 04/15/22 2225 04/16/22 0457 04/16/22 0952 04/16/22 1342 04/16/22 2044 04/17/22 0520  NA 124*   < > 124* 127* 123* 125* 124* 125* 130*  K 6.0*   < > 3.8 4.6 4.6 4.5  --   --  3.8  CL 97*   < > 97* 101 99 100  --   --  103  CO2 21*   < > 20* 20* 18* 18*  --   --  19*  GLUCOSE 94   < > 86 94 91 96  --   --  79  BUN 13   < > '12 12 11 10  '$ --   --  11  CREATININE 0.54   < > 0.67 0.69 0.68 0.57  --   --  0.59  CALCIUM 8.4*   < > 7.8* 7.9* 7.3* 7.8*  --   --  8.2*  MG 2.1  --   --   --  1.7  --   --   --   --   PHOS  --   --   --  3.0 2.6  --   --   --  2.5   < > = values in this interval not displayed.    GFR: Estimated Creatinine Clearance: 62.5 mL/min (by C-G formula based on SCr of 0.59 mg/dL).  Liver Function Tests: Recent Labs  Lab 04/14/22 1256 04/14/22 1619 04/15/22 0503 04/16/22 0457 04/17/22 0520  AST 57* 61* 50* 39 31  ALT 27 32 '28 20 18  '$ ALKPHOS 96 95 78 58 48  BILITOT 2.2* 2.1* 1.9* 2.1* 2.5*  PROT 6.2* 6.3* 5.3* 5.2* 5.4*  ALBUMIN 2.8* 2.6* 2.2* 3.1* 3.4*    CBG: No results for input(s): GLUCAP in the last 168 hours.   Recent Results (from the past 240 hour(s))  Body fluid culture w Gram Stain     Status: None (Preliminary result)   Collection Time: 04/16/22  8:38 AM   Specimen: Pleura; Body Fluid  Result Value Ref Range Status   Specimen Description   Final    PLEURAL LT Performed at [facility] [address] [place], [place] [address]    Special Requests   Final    Normal Performed at [facility], [place] [address], [place], [place]  [address]    Gram Stain   Final    WBC PRESENT, PREDOMINANTLY PMN NO ORGANISMS SEEN CYTOSPIN SMEAR    Culture   Final    NO GROWTH < 24 HOURS Performed at [facility], [place] [address], [place], [place] [address]    Report Status PENDING  Incomplete         Radiology Studies: DG CHEST PORT 1 VIEW  Result Date: 04/17/2022 CLINICAL DATA:  Follow-up pleural effusion.  Chest tube. EXAM: PORTABLE CHEST 1 VIEW COMPARISON:  Apr 16, 2022 FINDINGS: The left chest tube is been reposition with the tip now located in the superior most aspect of the left pleural space. No pneumothorax. The left-sided pleural effusion is much smaller in the interval but remains. There is probably [place] tiny right pleural effusion. Atelectasis is associated with the left effusion. Opacity in the right mid lower lung is mildly worsened. No other changes. IMPRESSION: 1. Repositioning of left chest tube as above. The left pleural effusion is smaller in the interval. 2. Infiltrate in the right mid lower lung is mildly worsened in the interval. Recommend attention on follow-up. 3. Opacity associated with the left-sided effusion is likely atelectasis. Electronically Signed   By: Dorise Bullion III M.D.   On: 04/17/2022 09:29   DG CHEST PORT 1 VIEW  Result Date: 04/16/2022 CLINICAL DATA:  Follow-up pleural effusion. EXAM: PORTABLE CHEST 1 VIEW COMPARISON:  Portable chest May 17 at 5:03 AM FINDINGS: 4:58 Clarene Curran.m., 04/16/2022. Left chest tube in place with tip in the apex, unchanged. There is no visible pneumothorax. There is Russell Engelstad large layering left pleural effusion increased in volume obscuring the lower 2/3 of the left chest. The mediastinum has shifted to the right but is  stable in configuration overall. The cardiac size is normal. There is increased patchy opacity in the right lower lung field consistent with pneumonia or aspiration most likely. Right upper lung field and left apex remain clear. The right sulci are sharp. Thoracic cage  intact. Scattered right chest surgical clips. IMPRESSION: 1. Large left pleural effusion, significantly increased since May 17 in spite of the chest tube placement. 2. Increased patchy consolidation right lower lung field most likely due to pneumonia or aspiration. Electronically Signed   By: Telford Nab M.D.   On: 04/16/2022 06:23   [place] EKG SITE RITE  Result Date: 04/17/2022 If Site Rite image not attached, placement could not be confirmed due to current cardiac rhythm.  [place] EKG SITE RITE  Result Date: 04/17/2022 If Site Rite image not attached, placement could not be confirmed due to current cardiac rhythm.       Scheduled Meds:  enoxaparin (LOVENOX) injection  30 mg Subcutaneous Q24H   feeding supplement (KATE FARMS STANDARD 1.4)  325 mL Oral TID BM   midodrine  12.5 mg Oral TID WC   multivitamin with minerals  1 tablet Oral Daily   nicotine  21 mg Transdermal Daily   pantoprazole  40 mg Oral Daily   sodium chloride flush  3 mL Intravenous Q12H   tamoxifen  20 mg Oral Daily   Continuous Infusions:  albumin human 25 g (04/17/22 1340)   ampicillin-sulbactam (UNASYN) IV 3 g (04/17/22 1156)      LOS: 2 days    Time spent: over 30 min    Fayrene Helper, MD [facility]   To contact the attending provider between 7A-7P or the covering provider during after hours 7P-7A, please log into the web site [email or web address] and access using universal [place] password for that web site. If you do not have the password, please call the hospital operator.  04/17/2022, 4:38 PM

## 2022-04-17 NOTE — Progress Notes (Addendum)
1 Liter pulled from PleurX with no issues.  Pt stated that she felt better with liter removed. BP has been stable throughout intervention.  Started at [place] and end time 48. Procedure successful.  Will continue to monitor pt and address needs PRN

## 2022-04-17 NOTE — Progress Notes (Addendum)
Pt complain of pain while getting labs drawn and has expressed that she would like a PICC line or Port placed to avoid pain from multiple lab draws.  Pt assured that this will be discussed with dayshift MD  At 0600 pt O2 was weaned from 5L to  4L. Pt able to tolerate change in O2 level. O2 sats are 92-95%  Throughout night pt maintained sats >91% with no distress. Pt was able to move to [facility] without SOB or O2 desaturation. Pt expressed that she would like to try to walk to the restroom at some point today and concern about becoming stiff from staying in bed.   Pt concerns relayed to dayshift RN

## 2022-04-17 NOTE — Progress Notes (Signed)
Theresa Rocha Progress Note  Subjective: Na+ up to 130 today.  MAP's a bit better w/ some > 80. Had 200 cc UOP yest and 120 cc today. Creat stable at 0.59.  Albumin up to 3.4. (2.2- 2.8 on admission).    Vitals:   04/17/22 0500 04/17/22 0600 04/17/22 0800 04/17/22 0825  BP: 113/87 (!) 88/52 96/61   Pulse: (!) 107 100 (!) 105   Resp: (!) 29 (!) 25 (!) 28   Temp:    98.7 F (37.1 C)  TempSrc:    Oral  SpO2: 90% 96% 94%   Weight:      Height:        Exam: Gen alert, no distress, thin and frail pleasant WF No jvd or bruits Chest R bases coarse rales mild, L base dec'd BS L chest Pleurx tube RRR no RG Abd soft ntnd no mass or ascites +bs Ext no LE edema Neuro is alert, Ox 3 , nf       Home meds include - baclofen, bentyl, im0dium, nicotine patch, oxy IR, protonix, phenergan, tamoxifen, colace, lasix 40 qd, kdur 20 qd, lidocaine patches   CXR 5/19 - IMPRESSION: 1. Large left pleural effusion, significantly increased since May 17 in spite of the chest tube placement. 2. Increased patchy consolidation right lower lung field most likely due to pneumonia or aspiration.      Assessment/ Plan: Hyponatremia - in a pt w/ cirrhosis and portal HTN. Treatment of hyponatremia in cirrhotics is unique in that the main goal is to improve blood pressures, which should improve renal perfusion and therefore water clearance. She got worse w/ IV NS. Started on midodrine 12.5 tid and IV albumin (x 3 days) w/ goal MAP > 82.  Avoiding diuretics/ BP lowering meds. Na+ up to 130 today which is much better. Unfortunately this will likely be a recurrent issue as her liver disease progresses. Would continue midodrine / albumin for now. Will follow.  Cirrhosis - no ascites which is bit unusual, normal renal function. Recurrent L pleural effusion - has L Pleurx placed 1 month ago Hypotension - as above, may be improving a bit HCC - sp radiation implant H/o breast cancer - rx'd by lumpectomy in  [place] 04/17/2022, 9:50 AM   Recent Labs  Lab 04/15/22 0503 04/15/22 1634 04/16/22 0457 04/16/22 0952 04/17/22 0520  HGB 11.0*  --  9.5*  --   --   ALBUMIN 2.2*  --  3.1*  --  3.4*  CALCIUM 7.7*   < > 7.3* 7.8* 8.2*  PHOS  --    < > 2.6  --  2.5  CREATININE 0.53   < > 0.68 0.57 0.59  K 4.6   < > 4.6 4.5 3.8   < > = values in this interval not displayed.   Inpatient medications:  enoxaparin (LOVENOX) injection  30 mg Subcutaneous Q24H   feeding supplement (KATE FARMS STANDARD 1.4)  325 mL Oral TID BM   midodrine  12.5 mg Oral TID WC   multivitamin with minerals  1 tablet Oral Daily   nicotine  21 mg Transdermal Daily   pantoprazole  40 mg Oral Daily   sodium chloride flush  3 mL Intravenous Q12H   tamoxifen  20 mg Oral Daily    albumin human 25 g (04/17/22 0708)   ampicillin-sulbactam (UNASYN) IV Stopped (04/17/22 0538)   acetaminophen **OR** acetaminophen, baclofen, dicyclomine, guaiFENesin-dextromethorphan, nicotine polacrilex, oxyCODONE, phenol, senna-docusate

## 2022-04-17 NOTE — Progress Notes (Signed)
Peripherally Inserted Central Catheter Placement  The IV Nurse has discussed with the patient and/or persons authorized to consent for the patient, the purpose of this procedure and the potential benefits and risks involved with this procedure.  The benefits include less needle sticks, lab draws from the catheter, and the patient may be discharged home with the catheter. Risks include, but not limited to, infection, bleeding, blood clot (thrombus formation), and puncture of an artery; nerve damage and irregular heartbeat and possibility to perform a PICC exchange if needed/ordered by physician.  Alternatives to this procedure were also discussed.  Bard Power PICC patient education guide, fact sheet on infection prevention and patient information card has been provided to patient /or left at bedside.  Pt agreeable to PICC placement and signed consent. Alert and oriented x 4.  PICC Placement Documentation  PICC Double Lumen 85/88/50 Right Basilic 35 cm 2 cm (Active)  Indication for Insertion or Continuance of Line Home intravenous therapies (PICC only) 04/17/22 1800  Exposed Catheter (cm) 2 cm 04/17/22 1800  Site Assessment Clean, Dry, Intact 04/17/22 1800  Lumen #1 Status Flushed;Saline locked;Blood return noted 04/17/22 1800  Lumen #2 Status Flushed;Saline locked;Blood return noted 04/17/22 1800  Dressing Type Transparent;Securing device 04/17/22 1800  Dressing Status Antimicrobial disc in place 04/17/22 1800  Safety Lock Not Applicable 27/74/12 8786  Line Care Connections checked and tightened 04/17/22 1800  Line Adjustment (NICU/IV Team Only) No 04/17/22 1800  Dressing Intervention New dressing 04/17/22 1800  Dressing Change Due 04/24/22 04/17/22 1800       [place] 04/17/2022, 6:13 PM

## 2022-04-18 ENCOUNTER — Inpatient Hospital Stay (HOSPITAL_COMMUNITY): Payer: BC Managed Care – PPO

## 2022-04-18 DIAGNOSIS — J9621 Acute and chronic respiratory failure with hypoxia: Secondary | ICD-10-CM | POA: Diagnosis not present

## 2022-04-18 LAB — CBC WITH DIFFERENTIAL/PLATELET
Abs Immature Granulocytes: 0.11 10*3/uL — ABNORMAL HIGH (ref 0.00–0.07)
Basophils Absolute: 0 10*3/uL (ref 0.0–0.1)
Basophils Relative: 0 %
Eosinophils Absolute: 0 10*3/uL (ref 0.0–0.5)
Eosinophils Relative: 0 %
HCT: 29.1 % — ABNORMAL LOW (ref 36.0–46.0)
Hemoglobin: 9 g/dL — ABNORMAL LOW (ref 12.0–15.0)
Immature Granulocytes: 1 %
Lymphocytes Relative: 4 %
Lymphs Abs: 0.5 10*3/uL — ABNORMAL LOW (ref 0.7–4.0)
MCH: 24.5 pg — ABNORMAL LOW (ref 26.0–34.0)
MCHC: 30.9 g/dL (ref 30.0–36.0)
MCV: 79.1 fL — ABNORMAL LOW (ref 80.0–100.0)
Monocytes Absolute: 1 10*3/uL (ref 0.1–1.0)
Monocytes Relative: 9 %
Neutro Abs: 9.2 10*3/uL — ABNORMAL HIGH (ref 1.7–7.7)
Neutrophils Relative %: 86 %
Platelets: 79 10*3/uL — ABNORMAL LOW (ref 150–400)
RBC: 3.68 MIL/uL — ABNORMAL LOW (ref 3.87–5.11)
RDW: 20.7 % — ABNORMAL HIGH (ref 11.5–15.5)
WBC: 10.8 10*3/uL — ABNORMAL HIGH (ref 4.0–10.5)
nRBC: 0 % (ref 0.0–0.2)

## 2022-04-18 LAB — RENAL FUNCTION PANEL
Albumin: 4.2 g/dL (ref 3.5–5.0)
Anion gap: 10 (ref 5–15)
BUN: 16 mg/dL (ref 6–20)
CO2: 20 mmol/L — ABNORMAL LOW (ref 22–32)
Calcium: 8.2 mg/dL — ABNORMAL LOW (ref 8.9–10.3)
Chloride: 103 mmol/L (ref 98–111)
Creatinine, Ser: 0.6 mg/dL (ref 0.44–1.00)
GFR, Estimated: >60 mL/min (ref >60)
Glucose, Bld: 100 mg/dL — ABNORMAL HIGH (ref 70–99)
Phosphorus: 2.5 mg/dL (ref 2.5–4.6)
Potassium: 3.4 mmol/L — ABNORMAL LOW (ref 3.5–5.1)
Sodium: 133 mmol/L — ABNORMAL LOW (ref 135–145)

## 2022-04-18 LAB — BRAIN NATRIURETIC PEPTIDE: B Natriuretic Peptide: 1123.1 pg/mL — ABNORMAL HIGH (ref 0.0–100.0)

## 2022-04-18 LAB — RESP PANEL BY RT-PCR (FLU A&B, COVID) ARPGX2
Influenza A by PCR: NEGATIVE
Influenza B by PCR: NEGATIVE
SARS Coronavirus 2 by RT PCR: NEGATIVE

## 2022-04-18 LAB — MAGNESIUM: Magnesium: 1.8 mg/dL (ref 1.7–2.4)

## 2022-04-18 MED ORDER — POTASSIUM CHLORIDE CRYS ER 20 MEQ PO TBCR
40.0000 meq | EXTENDED_RELEASE_TABLET | ORAL | Status: AC
  Administered 2022-04-18 (×2): 40 meq via ORAL
  Filled 2022-04-18 (×2): qty 2

## 2022-04-18 MED ORDER — FUROSEMIDE 10 MG/ML IJ SOLN
40.0000 mg | Freq: Once | INTRAMUSCULAR | Status: AC
  Administered 2022-04-18: 40 mg via INTRAVENOUS
  Filled 2022-04-18: qty 4

## 2022-04-18 MED ORDER — FUROSEMIDE 10 MG/ML IJ SOLN
40.0000 mg | Freq: Once | INTRAMUSCULAR | Status: AC
Start: 2022-04-18 — End: 2022-04-18
  Administered 2022-04-18: 40 mg via INTRAVENOUS
  Filled 2022-04-18: qty 4

## 2022-04-18 MED ORDER — FUROSEMIDE 10 MG/ML IJ SOLN
40.0000 mg | Freq: Two times a day (BID) | INTRAMUSCULAR | Status: DC
  Administered 2022-04-19 – 2022-04-22 (×7): 40 mg via INTRAVENOUS
  Filled 2022-04-18 (×7): qty 4

## 2022-04-18 NOTE — Progress Notes (Signed)
PROGRESS NOTE    [name]  TMH:[id number] DOB: [birth year] DOA: 04/14/2022 PCP: Seward Carol, MD  Chief Complaint  Patient presents with   abnormal labs    Brief Narrative:   Theresa Rocha is Theresa Rocha pleasant 56 y.o. female with medical history significant for cancer of the right breast, hepatocellular carcinoma, cirrhosis, and recurrent left pleural effusion with PleurX catheter now presenting to the emergency department for evaluation of worsening hyponatremia.  Patient reports that she has been holding her diuretics since 04/08/2022, had outpatient blood work performed today, and was notified that her sodium had decreased to 119.  She was advised to seek further evaluation in the ED.  She has not noticed any acute change in her chronic fatigue and general weakness, denies headache, denies nausea or vomiting, and denies change in her chronic loose stools.  She denies any ankle swelling.  She sometimes develops abdominal distention but none now.  She reports worsening weight loss in the past few weeks.  Denies fevers or chills.  Denies abdominal pain.   ED Course: Upon arrival to the ED, patient is found to be afebrile and saturating well on room air with systolic blood pressure in the 90s.  Chemistry panel notable for sodium of 124 and potassium 6.0.  Patient was given Lokelma and 500 cc normal saline bolus in the ED.    Assessment & Plan:   Principal Problem:   Acute on chronic respiratory failure with hypoxia (HCC) Active Problems:   Hyponatremia   Hyperkalemia   Decompensated HCV cirrhosis (HCC)   Pleural effusion on left   Hypotension   Hepatocellular carcinoma (HCC)   Thrombocytopenia (HCC)   Anemia   Breast cancer (HCC)   Assessment and Plan: * Acute on chronic respiratory failure with hypoxia (HCC) Fluctuating O2 needs, 5 L recently  Most recent CXR with increasing infiltrates diffusely throughout R lung -> pneumonia vs edema, L effusion Continue daily drainage of pleurx   Follow pleural fluid studies - no growth, cytology pending Continue abx.  Suspect worsening infiltrates represent edema with worsening BNP, holding lasix since admission, no infectious symptoms at this time (afebrile, only mild leukocytosis) Lasix today 40 mg x2 and follow   Hyponatremia Improved at this time - complicated picture with cirrhosis with recurrent L effusion, hypotension Urine sodium remains <10 Net positive 1.127 L.  Sodium had improved to 127 with IVF, now worse this AM to 123 -> holding IVF right now with worsening effusion this AM, ~1 L removed via pleurx this AM Renal has been c/s, appreciate assistanc e with volume, hyponatremia, lytes -> recommending midodrine and albumin with goal MAP >82 Am cortisol ~10, follow ACTH stim (normal) Follow TSH (wnl), Follow BNP elevated  Hyperkalemia S/p lokelma improved   Hypotension Related to cirrhosis and suspected hypovolemia S/p albumin IVF on hold - watch with diuresis today midodrine   Pleural effusion on left Per Dr. Ernestina Penna 5/11 note, occurred s/p L lobe liver ablation for [facility] on 09/16/2021  "suspicious for iatrogenic hepatic hydrothorax with ascites preferentially flowing into the L pleural space after the L lobe microwave ablation" per IR Draining 1 L daily from pleurx at home [name] had reached out to CT surgery regarding possibility of pleurodesis (appt scheduled 5/26) Echo 10/2021 with normal EF, normal diastolic parameters With worsening resp status, will plan to drain pleurx daily  Follow pending cytology, gram stain/culture - no growth, cell count - 32% neutrophils, LDH - transudate, no fluid protein done. Will continue  to discuss with CT surgery plans regarding potential procedure  Decompensated HCV cirrhosis ([place]) Related to hepatitis C and etoh use disorder  Complicated by esophageal varices, portal hypertensive gastropathy, and progressive multifocal HCC s/p microwave ablation 08/2021, s/p [id number]  radioembolization to R lobe of liver on 1/24 Appreciate GI assistance, follows with Roosevelt Locks NP at [facility]  Anemia No CBC today, will trend  Thrombocytopenia ([place]) Will continue to monitor (no CBC today)  Breast cancer ([place]) Follows with [name]      DVT prophylaxis: lovenox Code Status: full Family Communication: none Disposition:   Status is: inpatient The patient will require care spanning > 2 midnights and should be moved to inpatient because: need for GI, renal, CT surgery, oncology c/s   Consultants:  GI Renal Oncology CT surgery  Procedures:  none  Antimicrobials:  Anti-infectives (From admission, onward)    Start     Dose/Rate Route Frequency Ordered Stop   04/16/22 1800  Ampicillin-Sulbactam (UNASYN) 3 g in sodium chloride 0.9 % 100 mL IVPB        3 g 200 mL/hr over 30 Minutes Intravenous Every 6 hours 04/16/22 1139     04/16/22 1000  Ampicillin-Sulbactam (UNASYN) 3 g in sodium chloride 0.9 % 100 mL IVPB  Status:  Discontinued        3 g 200 mL/hr over 30 Minutes Intravenous Every 6 hours 04/16/22 0935 04/16/22 1139       Subjective: C/o worsening Sob with activity  Objective: Vitals:   04/18/22 0645 04/18/22 0650 04/18/22 0740 04/18/22 0748  BP:      Pulse:      Resp:      Temp:      TempSrc:      SpO2: (!) 70% 99% (!) 86% 91%  Weight:      Height:        Intake/Output Summary (Last 24 hours) at 04/18/2022 0924 Last data filed at 04/18/2022 0645 Gross per 24 hour  Intake 820.89 ml  Output 1270 ml  Net -449.11 ml   Filed Weights   04/14/22 1547 04/15/22 0500 04/16/22 0500  Weight: 39.9 kg 43.6 kg 49.8 kg    Examination:  General: No acute distress. Cardiovascular: tachy Lungs: diminished, sitting up due to SOB - L sided pleurx Abdomen: Soft, nontender, nondistended  Neurological: Alert and oriented 3. Moves all extremities 4 with equal strength. Cranial nerves II through XII grossly  intact. Extremities: No clubbing or cyanosis. No edema. No dependent edema.  Data Reviewed: I have personally reviewed following labs and imaging studies  CBC: Recent Labs  Lab 04/14/22 1256 04/14/22 1619 04/15/22 0503 04/16/22 0457 04/18/22 0442  WBC 10.0 9.7 7.7 11.5* 10.8*  NEUTROABS 7.8* 7.2  --  9.8* 9.2*  HGB 12.3 12.5 11.0* 9.5* 9.0*  HCT 37.4 38.7 35.0* 30.7* 29.1*  MCV 76.0* 77.7* 78.8* 80.4 79.1*  PLT 125* 125* 104* 78* 79*    Basic Metabolic Panel: Recent Labs  Lab 04/14/22 1619 04/15/22 0503 04/15/22 2225 04/16/22 0457 04/16/22 0952 04/16/22 1342 04/16/22 2044 04/17/22 0520 04/18/22 0442  NA 124*   < > 127* 123* 125* 124* 125* 130* 133*  K 6.0*   < > 4.6 4.6 4.5  --   --  3.8 3.4*  CL 97*   < > 101 99 100  --   --  103 103  CO2 21*   < > 20* 18* 18*  --   --  19*  20*  GLUCOSE 94   < > 94 91 96  --   --  79 100*  BUN 13   < > '12 11 10  '$ --   --  11 16  CREATININE 0.54   < > 0.69 0.68 0.57  --   --  0.59 0.60  CALCIUM 8.4*   < > 7.9* 7.3* 7.8*  --   --  8.2* 8.2*  MG 2.1  --   --  1.7  --   --   --   --  1.8  PHOS  --   --  3.0 2.6  --   --   --  2.5 2.5   < > = values in this interval not displayed.    GFR: Estimated Creatinine Clearance: 62.5 mL/min (by C-G formula based on SCr of 0.6 mg/dL).  Liver Function Tests: Recent Labs  Lab 04/14/22 1256 04/14/22 1619 04/15/22 0503 04/16/22 0457 04/17/22 0520 04/18/22 0442  AST 57* 61* 50* 39 31  --   ALT 27 32 '28 20 18  '$ --   ALKPHOS 96 95 78 58 48  --   BILITOT 2.2* 2.1* 1.9* 2.1* 2.5*  --   PROT 6.2* 6.3* 5.3* 5.2* 5.4*  --   ALBUMIN 2.8* 2.6* 2.2* 3.1* 3.4* 4.2    CBG: No results for input(s): GLUCAP in the last 168 hours.   Recent Results (from the past 240 hour(s))  Body fluid culture w Gram Stain     Status: None (Preliminary result)   Collection Time: 04/16/22  8:38 AM   Specimen: Pleura; Body Fluid  Result Value Ref Range Status   Specimen Description   Final    PLEURAL  LT Performed at [facility] [address], [place], [place] [address]    Special Requests   Final    Normal Performed at [facility], [place] [address] [place], [place] [address]    Gram Stain   Final    WBC PRESENT, PREDOMINANTLY PMN NO ORGANISMS SEEN CYTOSPIN SMEAR    Culture   Final    NO GROWTH 2 DAYS Performed at [facility], [place] [address] [place], [place] [address]    Report Status PENDING  Incomplete         Radiology Studies: DG CHEST PORT 1 VIEW  Result Date: 04/18/2022 CLINICAL DATA:  Follow-up chest x-ray EXAM: PORTABLE CHEST 1 VIEW COMPARISON:  Apr 17, 2022 FINDINGS: Larisha Vencill right PICC line terminates in the central SVC. Briyan Kleven left chest tube is stable. No pneumothorax. The cardiomediastinal silhouette is stable. There is Joandy Burget left-sided pleural effusion with underlying atelectasis. Infiltrate diffusely throughout the right lung has worsened. There is probably Lilleigh Hechavarria small right effusion. No other changes. IMPRESSION: 1. Support apparatus as above. 2. Increasing infiltrate diffusely throughout the right lung could represent pneumonia or asymmetric edema. An infectious process is favored. Recommend clinical correlation. 3. Left pleural effusion with underlying atelectasis. Electronically Signed   By: Dorise Bullion III M.D.   On: 04/18/2022 08:01   DG CHEST PORT 1 VIEW  Result Date: 04/17/2022 CLINICAL DATA:  Follow-up pleural effusion.  Chest tube. EXAM: PORTABLE CHEST 1 VIEW COMPARISON:  Apr 16, 2022 FINDINGS: The left chest tube is been reposition with the tip now located in the superior most aspect of the left pleural space. No pneumothorax. The left-sided pleural effusion is much smaller in the interval but remains. There is probably Rhylan Kagel tiny right pleural effusion. Atelectasis is  associated with the left effusion. Opacity in the right mid lower lung is mildly worsened. No other changes. IMPRESSION: 1. Repositioning of left chest tube as  above. The left pleural effusion is smaller in the interval. 2. Infiltrate in the right mid lower lung is mildly worsened in the interval. Recommend attention on follow-up. 3. Opacity associated with the left-sided effusion is likely atelectasis. Electronically Signed   By: Dorise Bullion III M.D.   On: 04/17/2022 09:29   [place] EKG SITE RITE  Result Date: 04/17/2022 If Site Rite image not attached, placement could not be confirmed due to current cardiac rhythm.  [place] EKG SITE RITE  Result Date: 04/17/2022 If Site Rite image not attached, placement could not be confirmed due to current cardiac rhythm.       Scheduled Meds:  Chlorhexidine Gluconate Cloth  6 each Topical Daily   enoxaparin (LOVENOX) injection  30 mg Subcutaneous Q24H   feeding supplement (KATE FARMS STANDARD 1.4)  325 mL Oral TID BM   furosemide  40 mg Intravenous Once   midodrine  12.5 mg Oral TID WC   multivitamin with minerals  1 tablet Oral Daily   nicotine  21 mg Transdermal Daily   pantoprazole  40 mg Oral Daily   potassium chloride  40 mEq Oral Q4H   sodium chloride flush  10-40 mL Intracatheter Q12H   sodium chloride flush  3 mL Intravenous Q12H   tamoxifen  20 mg Oral Daily   Continuous Infusions:  ampicillin-sulbactam (UNASYN) IV 3 g (04/18/22 0536)      LOS: 3 days    Time spent: over 30 min    Fayrene Helper, MD [facility]   To contact the attending provider between 7A-7P or the covering provider during after hours 7P-7A, please log into the web site [email or web address] and access using universal [place] password for that web site. If you do not have the password, please call the hospital operator.  04/18/2022, 9:24 AM

## 2022-04-18 NOTE — Progress Notes (Signed)
Patient changed to a 12L [place] to maintain saturation @ 94%. She was previously on a 6l nasal cannula and was having her saturation decrease to 87%. The patient felt like she could not breath deep. Rt will continue to monitor

## 2022-04-18 NOTE — Progress Notes (Signed)
[facility] Progress Note  Subjective: Na+ up to 133 today.   Vitals:   04/18/22 1200 04/18/22 1224 04/18/22 1225 04/18/22 1310  BP:      Pulse:   (!) 113   Resp:   (!) 22   Temp:    98.1 F (36.7 C)  TempSrc:    Oral  SpO2: (!) 86% 94% 94%   Weight:      Height:        Exam: Gen alert, no distress, thin and frail pleasant WF No jvd or bruits Chest R lung mostly clear, L base dec'd BS L chest Pleurx tube RRR no RG Abd soft ntnd no mass or ascites +bs Ext no LE edema Neuro is alert, Ox 3 , nf       Home meds include - baclofen, bentyl, im0dium, nicotine patch, oxy IR, protonix, phenergan, tamoxifen, colace, lasix 40 qd, kdur 20 qd, lidocaine patches   CXR 5/19 - IMPRESSION: 1. Large left pleural effusion, significantly increased since May 17 in spite of the chest tube placement. 2. Increased patchy consolidation right lower lung field most likely due to pneumonia or aspiration.      Assessment/ Plan: Hyponatremia - in a pt w/ cirrhosis and portal HTN. Treatment of hyponatremia in cirrhotics is unique in that the main goal is to improve blood pressures, which should then improve renal perfusion and therefore water clearance. NS IVF's dc'd and started on midodrine 12.5 tid and IV albumin. MAP has improved (goal > 82) and Na+ is now normal. Has completed albumin course. Would cont midodrine for now and can taper down the dose if needed to maintain MAP > 80 and Na > 128 if possible. Can keep her on the midodrine at dc as well if there is a PCP to follow her.  No other suggestions, will sign off.  SOB/ hypoxia - possibly R PNA vs edema by CXR. I/O are negative but wts are up. Agree w/ trial of IV lasix '40mg'$ .  Cirrhosis - no ascites which is bit unusual, normal renal function. Recurrent L pleural effusion - has L Pleurx placed 1 month ago Hypotension - as above, may be improving a bit HCC - sp radiation implant H/o breast cancer - rx'd by lumpectomy in [place] 04/18/2022, 1:57 PM   Recent Labs  Lab 04/16/22 0457 04/16/22 0952 04/17/22 0520 04/18/22 0442  HGB 9.5*  --   --  9.0*  ALBUMIN 3.1*  --  3.4* 4.2  CALCIUM 7.3*   < > 8.2* 8.2*  PHOS 2.6  --  2.5 2.5  CREATININE 0.68   < > 0.59 0.60  K 4.6   < > 3.8 3.4*   < > = values in this interval not displayed.    Inpatient medications:  Chlorhexidine Gluconate Cloth  6 each Topical Daily   enoxaparin (LOVENOX) injection  30 mg Subcutaneous Q24H   feeding supplement (KATE FARMS STANDARD 1.4)  325 mL Oral TID BM   furosemide  40 mg Intravenous Once   midodrine  12.5 mg Oral TID WC   multivitamin with minerals  1 tablet Oral Daily   nicotine  21 mg Transdermal Daily   pantoprazole  40 mg Oral Daily   sodium chloride flush  10-40 mL Intracatheter Q12H   sodium chloride flush  3 mL Intravenous Q12H   tamoxifen  20 mg Oral Daily    ampicillin-sulbactam (UNASYN) IV 3 g (04/18/22 1224)   acetaminophen **OR**  acetaminophen, baclofen, dicyclomine, guaiFENesin-dextromethorphan, nicotine polacrilex, oxyCODONE, phenol, senna-docusate, sodium chloride flush

## 2022-04-19 ENCOUNTER — Inpatient Hospital Stay (HOSPITAL_COMMUNITY): Payer: BC Managed Care – PPO

## 2022-04-19 DIAGNOSIS — J9621 Acute and chronic respiratory failure with hypoxia: Secondary | ICD-10-CM | POA: Diagnosis not present

## 2022-04-19 LAB — CBC WITH DIFFERENTIAL/PLATELET
Abs Immature Granulocytes: 0.13 10*3/uL — ABNORMAL HIGH (ref 0.00–0.07)
Basophils Absolute: 0.1 10*3/uL (ref 0.0–0.1)
Basophils Relative: 1 %
Eosinophils Absolute: 0.1 10*3/uL (ref 0.0–0.5)
Eosinophils Relative: 1 %
HCT: 28.3 % — ABNORMAL LOW (ref 36.0–46.0)
Hemoglobin: 9 g/dL — ABNORMAL LOW (ref 12.0–15.0)
Immature Granulocytes: 1 %
Lymphocytes Relative: 6 %
Lymphs Abs: 0.6 10*3/uL — ABNORMAL LOW (ref 0.7–4.0)
MCH: 25.3 pg — ABNORMAL LOW (ref 26.0–34.0)
MCHC: 31.8 g/dL (ref 30.0–36.0)
MCV: 79.5 fL — ABNORMAL LOW (ref 80.0–100.0)
Monocytes Absolute: 1 10*3/uL (ref 0.1–1.0)
Monocytes Relative: 11 %
Neutro Abs: 7.3 10*3/uL (ref 1.7–7.7)
Neutrophils Relative %: 80 %
Platelets: 79 10*3/uL — ABNORMAL LOW (ref 150–400)
RBC: 3.56 MIL/uL — ABNORMAL LOW (ref 3.87–5.11)
RDW: 21.2 % — ABNORMAL HIGH (ref 11.5–15.5)
WBC: 9.1 10*3/uL (ref 4.0–10.5)
nRBC: 0 % (ref 0.0–0.2)

## 2022-04-19 LAB — MRSA NEXT GEN BY PCR, NASAL: MRSA by PCR Next Gen: NOT DETECTED

## 2022-04-19 LAB — URINALYSIS, ROUTINE W REFLEX MICROSCOPIC
Bilirubin Urine: NEGATIVE
Glucose, UA: NEGATIVE mg/dL
Hgb urine dipstick: NEGATIVE
Ketones, ur: NEGATIVE mg/dL
Leukocytes,Ua: NEGATIVE
Nitrite: NEGATIVE
Protein, ur: NEGATIVE mg/dL
Specific Gravity, Urine: 1.018 (ref 1.005–1.030)
pH: 5 (ref 5.0–8.0)

## 2022-04-19 LAB — BRAIN NATRIURETIC PEPTIDE: B Natriuretic Peptide: 858.8 pg/mL — ABNORMAL HIGH (ref 0.0–100.0)

## 2022-04-19 LAB — LACTATE DEHYDROGENASE, PLEURAL OR PERITONEAL FLUID: LD, Fluid: 46 U/L — ABNORMAL HIGH (ref 3–23)

## 2022-04-19 LAB — BODY FLUID CULTURE W GRAM STAIN
Culture: NO GROWTH
Special Requests: NORMAL

## 2022-04-19 LAB — PROTEIN, PLEURAL OR PERITONEAL FLUID: Total protein, fluid: <3 g/dL

## 2022-04-19 LAB — RESPIRATORY PANEL BY PCR

## 2022-04-19 LAB — BODY FLUID CELL COUNT WITH DIFFERENTIAL
Lymphs, Fluid: 24 %
Monocyte-Macrophage-Serous Fluid: 47 % — ABNORMAL LOW (ref 50–90)
Neutrophil Count, Fluid: 29 % — ABNORMAL HIGH (ref 0–25)
Total Nucleated Cell Count, Fluid: 158 cu mm (ref 0–1000)

## 2022-04-19 LAB — COMPREHENSIVE METABOLIC PANEL
ALT: 15 U/L (ref 0–44)
AST: 33 U/L (ref 15–41)
Albumin: 3.5 g/dL (ref 3.5–5.0)
Alkaline Phosphatase: 56 U/L (ref 38–126)
Anion gap: 8 (ref 5–15)
BUN: 16 mg/dL (ref 6–20)
CO2: 22 mmol/L (ref 22–32)
Calcium: 8 mg/dL — ABNORMAL LOW (ref 8.9–10.3)
Chloride: 106 mmol/L (ref 98–111)
Creatinine, Ser: 0.72 mg/dL (ref 0.44–1.00)
GFR, Estimated: >60 mL/min (ref >60)
Glucose, Bld: 127 mg/dL — ABNORMAL HIGH (ref 70–99)
Potassium: 3.7 mmol/L (ref 3.5–5.1)
Sodium: 136 mmol/L (ref 135–145)
Total Bilirubin: 2.2 mg/dL — ABNORMAL HIGH (ref 0.3–1.2)
Total Protein: 5.6 g/dL — ABNORMAL LOW (ref 6.5–8.1)

## 2022-04-19 LAB — MAGNESIUM: Magnesium: 1.9 mg/dL (ref 1.7–2.4)

## 2022-04-19 LAB — STREP PNEUMONIAE URINARY ANTIGEN: Strep Pneumo Urinary Antigen: NEGATIVE

## 2022-04-19 LAB — PHOSPHORUS: Phosphorus: 2.6 mg/dL (ref 2.5–4.6)

## 2022-04-19 LAB — LACTATE DEHYDROGENASE: LDH: 217 U/L — ABNORMAL HIGH (ref 98–192)

## 2022-04-19 MED ORDER — VANCOMYCIN HCL 500 MG/100ML IV SOLN
500.0000 mg | INTRAVENOUS | Status: DC
  Filled 2022-04-19: qty 100

## 2022-04-19 MED ORDER — VANCOMYCIN HCL 750 MG/150ML IV SOLN
750.0000 mg | Freq: Once | INTRAVENOUS | Status: DC
  Filled 2022-04-19: qty 150

## 2022-04-19 MED ORDER — VANCOMYCIN HCL IN DEXTROSE 1-5 GM/200ML-% IV SOLN: 1000.0000 mg | INTRAVENOUS | Status: DC

## 2022-04-19 MED ORDER — SODIUM CHLORIDE 0.9 % IV SOLN
2.0000 g | Freq: Two times a day (BID) | INTRAVENOUS | Status: DC
  Administered 2022-04-19 – 2022-04-25 (×12): 2 g via INTRAVENOUS
  Filled 2022-04-19 (×13): qty 12.5

## 2022-04-19 MED ORDER — ALBUTEROL SULFATE (2.5 MG/3ML) 0.083% IN NEBU
2.5000 mg | INHALATION_SOLUTION | RESPIRATORY_TRACT | Status: DC | PRN
  Administered 2022-04-19: 2.5 mg via RESPIRATORY_TRACT
  Filled 2022-04-19: qty 3

## 2022-04-19 MED ORDER — SODIUM CHLORIDE 0.9 % IV SOLN
2.0000 g | Freq: Three times a day (TID) | INTRAVENOUS | Status: DC
  Filled 2022-04-19: qty 12.5

## 2022-04-19 MED ORDER — VANCOMYCIN HCL IN DEXTROSE 1-5 GM/200ML-% IV SOLN
1000.0000 mg | Freq: Once | INTRAVENOUS | Status: DC
Start: 2022-04-19 — End: 2022-04-19

## 2022-04-19 MED ORDER — POTASSIUM CHLORIDE CRYS ER 20 MEQ PO TBCR
40.0000 meq | EXTENDED_RELEASE_TABLET | ORAL | Status: AC
  Administered 2022-04-19 (×2): 40 meq via ORAL
  Filled 2022-04-19 (×2): qty 2

## 2022-04-19 MED ORDER — VANCOMYCIN HCL 750 MG/150ML IV SOLN
750.0000 mg | Freq: Once | INTRAVENOUS | Status: AC
  Administered 2022-04-19: 750 mg via INTRAVENOUS
  Filled 2022-04-19: qty 150

## 2022-04-19 NOTE — Plan of Care (Signed)
  Problem: Health Behavior/Discharge Planning: Goal: Ability to manage health-related needs will improve Outcome: Progressing   Problem: Clinical Measurements: Goal: Will remain free from infection Outcome: Progressing Goal: Diagnostic test results will improve Outcome: Progressing   Problem: Coping: Goal: Level of anxiety will decrease Outcome: Progressing

## 2022-04-19 NOTE — Progress Notes (Addendum)
Pharmacy Antibiotic Note  Theresa Rocha is a 56 y.o. female with Jacobus, breast cancer, cirrhosis, and recurrent pleural effusion (s/p Pleurx catheter placement) who presented to the ED on 04/14/2022 after she was found to have low sodium with labs collected at [facility].  CXR on 04/16/22 showed enlarging left pleural effusion and  increased patchy consolidation right lower lung field with concern for pneumonia or aspiration, and patient placed on Unasyn. Today, patient with fever, tachycardia, tachypnea, and hypoxia. Antibiotics broadened to Vancomycin and Cefepime. Pharmacy consulted for dosing of antibiotics.    Plan: Vancomycin '750mg'$  IV x 1, then '500mg'$  IV q24h Vancomycin levels at steady state, as indicated Cefepime 2g IV q12h Monitor renal function, cultures, clinical course  _______________________________ Height: '5\' 3"'$  (160 cm) Weight: 39.5 kg (87 lb 1.3 oz) IBW/kg (Calculated) : 52.4  Temp (24hrs), Avg:100.3 F (37.9 C), Min:100.1 F (37.8 C), Max:100.5 F (38.1 C)  Recent Labs  Lab 04/14/22 1619 04/15/22 0503 04/15/22 1634 04/16/22 0457 04/16/22 0952 04/17/22 0520 04/18/22 0442 04/19/22 0507  WBC 9.7 7.7  --  11.5*  --   --  10.8* 9.1  CREATININE 0.54 0.53   < > 0.68 0.57 0.59 0.60 0.72   < > = values in this interval not displayed.    Estimated Creatinine Clearance: 49.5 mL/min (by C-G formula based on SCr of 0.72 mg/dL).    Allergies  Allergen Reactions   Hydromorphone Hcl Nausea And Vomiting    Severe vomiting   Aspirin Nausea Only    Antimicrobials this admission:  5/19 Unasyn >> 5/22 5/22 Vancomycin >> 5/22 Cefepime >>  Dose adjustments this admission:  --  Microbiology results:  5/19 Pleural fluid: NGF 5/22 BCx: 5/22 Sputum:  5/22 Pleural fluid:  5/22 Respiratory panel:  5/22 MRSA PCR:   5/22 Strep pneumo urinary antigen:    Thank you for allowing pharmacy to be a part of this patient's care.    Lindell Spar, PharmD, BCPS Clinical Pharmacist   04/19/2022 3:52 PM

## 2022-04-19 NOTE — Progress Notes (Addendum)
PROGRESS NOTE    Theresa Rocha  TDV:[id number] DOB: 1966-06-27 DOA: 04/14/2022 PCP: Seward Carol, MD  Chief Complaint  Patient presents with   abnormal labs    Brief Narrative:   Theresa Rocha is Theresa Rocha pleasant 56 y.o. female with medical history significant for cancer of the right breast, hepatocellular carcinoma, cirrhosis, and recurrent left pleural effusion with PleurX catheter now presenting to the emergency department for evaluation of worsening hyponatremia.  Patient reports that she has been holding her diuretics since 04/08/2022, had outpatient blood work performed today, and was notified that her sodium had decreased to 119.  She was advised to seek further evaluation in the ED.  She has not noticed any acute change in her chronic fatigue and general weakness, denies headache, denies nausea or vomiting, and denies change in her chronic loose stools.  She denies any ankle swelling.  She sometimes develops abdominal distention but none now.  She reports worsening weight loss in the past few weeks.  Denies fevers or chills.  Denies abdominal pain.   ED Course: Upon arrival to the ED, patient is found to be afebrile and saturating well on room air with systolic blood pressure in the 90s.  Chemistry panel notable for sodium of 124 and potassium 6.0.  Patient was given Lokelma and 500 cc normal saline bolus in the ED.    Assessment & Plan:   Principal Problem:   Acute on chronic respiratory failure with hypoxia (HCC) Active Problems:   Hyponatremia   SIRS (systemic inflammatory response syndrome) (HCC)   Hyperkalemia   Decompensated HCV cirrhosis (HCC)   Pleural effusion on left   Hypotension   Hepatocellular carcinoma (HCC)   Thrombocytopenia (HCC)   Anemia   Breast cancer (HCC)   Assessment and Plan: * Acute on chronic respiratory failure with hypoxia (HCC) Fluctuating O2 needs, 5 L recently  Most recent CXR with increasing infiltrates diffusely throughout R lung -> pneumonia vs  edema, L effusion Continue daily drainage of pleurx  Follow pleural fluid studies - no growth, cytology pending Continue abx.  Suspect worsening infiltrates represent edema with worsening BNP, holding lasix since admission, no infectious symptoms at this time (afebrile, only mild leukocytosis) Lasix today 40 mg BID   Hyponatremia Improving at this time - complicated picture with cirrhosis with recurrent L effusion, hypotension Urine sodium remains <10 Now on lasix and getting daily pleurx drainage Renal has been c/s, appreciate assistanc e with volume, hyponatremia, lytes -> recommending midodrine and albumin with goal MAP >82 Am cortisol ~10, follow ACTH stim (normal) Follow TSH (wnl), Follow BNP elevated  Hyperkalemia S/p lokelma improved   SIRS (systemic inflammatory response syndrome) (HCC) Fever today with tachycardia, tachypnea, hypoxia Has been on unasyn from 5/19 -> 5/22 CXR this AM with diffuse R lung opacities, small R effusion, stable L effusion  Most likely cause would seem to be related to pneumonia Negative covid/influenza 5/21 Add on RVP today Follow MRSA PCR, repeat blood cultures Sputum culture Repeat cultures, labs from effusion Given fever on unasyn, add vanc/cefepime 5/22 -     Hypotension Related to cirrhosis and suspected hypovolemia S/p albumin IVF on hold - watch with diuresis today midodrine   Pleural effusion on left Per Dr. 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 had reached out to CT surgery regarding possibility of pleurodesis (appt scheduled 5/26) Echo 10/2021 with normal EF, normal diastolic parameters With worsening resp status, will plan to drain pleurx daily  Follow pending cytology, gram stain/culture -  no growth, cell count - 32% neutrophils, LDH - transudate, no fluid protein done. Will continue to discuss with CT surgery plans regarding potential procedure once she's more stable   Decompensated HCV cirrhosis ([place]) Related to hepatitis C and etoh use disorder  Complicated by esophageal varices, portal hypertensive gastropathy, and progressive multifocal HCC s/p microwave ablation 08/2021, s/p [id number] radioembolization to R lobe of liver on 1/24 Appreciate GI assistance, follows with Roosevelt Locks NP at [facility]  Anemia will trend  Thrombocytopenia ([place]) Will continue to monitor  Breast cancer ([place]) Follows with [name]      DVT prophylaxis: lovenox Code Status: full Family Communication: none Disposition:   Status is: inpatient The patient will require care spanning > 2 midnights and should be moved to inpatient because: need for GI, renal, CT surgery, oncology c/s   Consultants:  GI Renal Oncology CT surgery  Procedures:  none  Antimicrobials:  Anti-infectives (From admission, onward)    Start     Dose/Rate Route Frequency Ordered Stop   04/16/22 1800  Ampicillin-Sulbactam (UNASYN) 3 g in sodium chloride 0.9 % 100 mL IVPB  Status:  Discontinued        3 g 200 mL/hr over 30 Minutes Intravenous Every 6 hours 04/16/22 1139 04/19/22 1421   04/16/22 1000  Ampicillin-Sulbactam (UNASYN) 3 g in sodium chloride 0.9 % 100 mL IVPB  Status:  Discontinued        3 g 200 mL/hr over 30 Minutes Intravenous Every 6 hours 04/16/22 0935 04/16/22 1139       Subjective: No new complaints  Objective: Vitals:   04/19/22 0549 04/19/22 0800 04/19/22 1052 04/19/22 1227  BP: (!) 86/58 91/60  103/64  Pulse:  (!) 105  (!) 105  Resp:  19  (!) 30  Temp:    (!) 100.5 F (38.1 C)  TempSrc: Oral   Oral  SpO2:  97% 94% 90%  Weight:      Height:        Intake/Output Summary (Last 24 hours) at 04/19/2022 1435 Last data filed at 04/19/2022 1422 Gross  per 24 hour  Intake 200 ml  Output 1625 ml  Net -1425 ml   Filed Weights   04/14/22 1547 04/15/22 0500 04/16/22 0500  Weight: 39.9 kg 43.6 kg 49.8 kg    Examination:  General: No acute distress. Cardiovascular: RRR Lungs: L sided chest tube Abdomen: Soft, nontender, nondistended Neurological: Alert and oriented 3. Moves all extremities 4. Cranial nerves II through XII grossly intact. Extremities: No clubbing or cyanosis. No edema.  Data Reviewed: I have personally reviewed following labs and imaging studies  CBC: Recent Labs  Lab 04/14/22 1256 04/14/22 1619 04/15/22 0503 04/16/22 0457 04/18/22 0442 04/19/22 0507  WBC 10.0 9.7 7.7 11.5* 10.8* 9.1  NEUTROABS 7.8* 7.2  --  9.8* 9.2* 7.3  HGB 12.3 12.5 11.0* 9.5* 9.0* 9.0*  HCT 37.4 38.7 35.0* 30.7* 29.1* 28.3*  MCV 76.0* 77.7* 78.8* 80.4 79.1* 79.5*  PLT 125* 125* 104* 78* 79* 79*    Basic Metabolic Panel: Recent Labs  Lab 04/14/22 1619 04/15/22 0503 04/15/22 2225 04/16/22 0457 04/16/22 8546 04/16/22 1342 04/16/22 2044 04/17/22 0520 04/18/22 0442 04/19/22 0507  NA 124*   < >  127* 123* 125* 124* 125* 130* 133* 136  K 6.0*   < > 4.6 4.6 4.5  --   --  3.8 3.4* 3.7  CL 97*   < > 101 99 100  --   --  103 103 106  CO2 21*   < > 20* 18* 18*  --   --  19* 20* 22  GLUCOSE 94   < > 94 91 96  --   --  79 100* 127*  BUN 13   < > '12 11 10  '$ --   --  '11 16 16  '$ CREATININE 0.54   < > 0.69 0.68 0.57  --   --  0.59 0.60 0.72  CALCIUM 8.4*   < > 7.9* 7.3* 7.8*  --   --  8.2* 8.2* 8.0*  MG 2.1  --   --  1.7  --   --   --   --  1.8 1.9  PHOS  --   --  3.0 2.6  --   --   --  2.5 2.5 2.6   < > = values in this interval not displayed.    GFR: Estimated Creatinine Clearance: 62.5 mL/min (by C-G formula based on SCr of 0.72 mg/dL).  Liver Function Tests: Recent Labs  Lab 04/14/22 1619 04/15/22 0503 04/16/22 0457 04/17/22 0520 04/18/22 0442 04/19/22 0507  AST 61* 50* 39 31  --  33  ALT 32 '28 20 18  '$ --  15  ALKPHOS 95  78 58 48  --  56  BILITOT 2.1* 1.9* 2.1* 2.5*  --  2.2*  PROT 6.3* 5.3* 5.2* 5.4*  --  5.6*  ALBUMIN 2.6* 2.2* 3.1* 3.4* 4.2 3.5    CBG: No results for input(s): GLUCAP in the last 168 hours.   Recent Results (from the past 240 hour(s))  Body fluid culture w Gram Stain     Status: None   Collection Time: 04/16/22  8:38 AM   Specimen: Pleura; Body Fluid  Result Value Ref Range Status   Specimen Description   Final    PLEURAL LT Performed at [facility] [address], [place], [place] [address]    Special Requests   Final    Normal Performed at [facility], [place] [address] [place], [place] [address]    Gram Stain   Final    WBC PRESENT, PREDOMINANTLY PMN NO ORGANISMS SEEN CYTOSPIN SMEAR    Culture   Final    NO GROWTH 3 DAYS Performed at [facility], [place] [address], [place], [place] [address]    Report Status 04/19/2022 FINAL  Final  Resp Panel by RT-PCR (Flu Tahirih Lair&B, Covid) Nasopharyngeal Swab     Status: None   Collection Time: 04/18/22  9:52 AM   Specimen: Nasopharyngeal Swab; Nasopharyngeal(NP) swabs in vial transport medium  Result Value Ref Range Status   SARS Coronavirus 2 by RT PCR NEGATIVE NEGATIVE Final    Comment: (NOTE) SARS-CoV-2 target nucleic acids are NOT DETECTED.  The SARS-CoV-2 RNA is generally detectable in upper respiratory specimens during the acute phase of infection. The lowest concentration of SARS-CoV-2 viral copies this assay can detect is 138 copies/mL. Jeanmarc Viernes negative result does not preclude SARS-Cov-2 infection and should not be used as the sole basis for treatment or other patient management decisions. Olie Scaffidi negative result may occur with  improper specimen collection/handling, submission of specimen other than nasopharyngeal swab, presence of viral mutation(s) within the areas  targeted by this assay, and inadequate number of viral copies(<138 copies/mL). Jamariya Davidoff negative result must be combined  with clinical observations, patient history, and epidemiological information. The expected result is Negative.  Fact Sheet for Patients:  EntrepreneurPulse.com.au  Fact Sheet for Healthcare Providers:  IncredibleEmployment.be  This test is no t yet approved or cleared by the [place] FDA and  has been authorized for detection and/or diagnosis of SARS-CoV-2 by FDA under an Emergency Use Authorization (EUA). This EUA will remain  in effect (meaning this test can be used) for the duration of the COVID-19 declaration under Section 564(b)(1) of the Act, 21 U.S.C.section 360bbb-3(b)(1), unless the authorization is terminated  or revoked sooner.       Influenza Carsten Carstarphen by PCR NEGATIVE NEGATIVE Final   Influenza B by PCR NEGATIVE NEGATIVE Final    Comment: (NOTE) The Xpert Xpress SARS-CoV-2/FLU/RSV plus assay is intended as an aid in the diagnosis of influenza from Nasopharyngeal swab specimens and should not be used as Eirik Schueler sole basis for treatment. Nasal washings and aspirates are unacceptable for Xpert Xpress SARS-CoV-2/FLU/RSV testing.  Fact Sheet for Patients: EntrepreneurPulse.com.au  Fact Sheet for Healthcare Providers: IncredibleEmployment.be  This test is not yet approved or cleared by the [place] FDA and has been authorized for detection and/or diagnosis of SARS-CoV-2 by FDA under an Emergency Use Authorization (EUA). This EUA will remain in effect (meaning this test can be used) for the duration of the COVID-19 declaration under Section 564(b)(1) of the Act, 21 U.S.C. section 360bbb-3(b)(1), unless the authorization is terminated or revoked.  Performed at [facility], [place] [address] [place], [place] [address]          Radiology Studies: DG CHEST PORT 1 VIEW  Result Date: 04/19/2022 CLINICAL DATA:  Follow-up chest tube EXAM: PORTABLE CHEST 1 VIEW COMPARISON:   04/18/2022 FINDINGS: Right upper extremity PICC tip overlies the superior cavoatrial junction. Unchanged cardiomediastinal silhouette. Unchanged position of left apical chest tube. There is no visible pneumothorax. Stable left pleural effusion with adjacent basilar atelectasis. Unchanged diffuse right lung opacities and probable small right pleural effusion. No acute osseous abnormality. Surgical clips overlie the right hemithorax. IMPRESSION: Stable left apical chest tube without visible pneumothorax. Stable left pleural effusion and adjacent left basilar atelectasis. Stable diffuse right lung opacities and small right pleural effusion. Electronically Signed   By: Maurine Simmering M.D.   On: 04/19/2022 08:06   DG CHEST PORT 1 VIEW  Result Date: 04/18/2022 CLINICAL DATA:  Follow-up chest x-ray EXAM: PORTABLE CHEST 1 VIEW COMPARISON:  Apr 17, 2022 FINDINGS: Autumn Gunn right PICC line terminates in the central SVC. Anastacio Bua left chest tube is stable. No pneumothorax. The cardiomediastinal silhouette is stable. There is Wilhemina Grall left-sided pleural effusion with underlying atelectasis. Infiltrate diffusely throughout the right lung has worsened. There is probably Lorre Opdahl small right effusion. No other changes. IMPRESSION: 1. Support apparatus as above. 2. Increasing infiltrate diffusely throughout the right lung could represent pneumonia or asymmetric edema. An infectious process is favored. Recommend clinical correlation. 3. Left pleural effusion with underlying atelectasis. Electronically Signed   By: Dorise Bullion III M.D.   On: 04/18/2022 08:01        Scheduled Meds:  Chlorhexidine Gluconate Cloth  6 each Topical Daily   enoxaparin (LOVENOX) injection  30 mg Subcutaneous Q24H   feeding supplement (KATE FARMS STANDARD 1.4)  325 mL Oral TID BM   furosemide  40 mg Intravenous BID   midodrine  12.5 mg Oral TID WC  multivitamin with minerals  1 tablet Oral Daily   nicotine  21 mg Transdermal Daily   pantoprazole  40 mg Oral Daily    potassium chloride  40 mEq Oral Q4H   sodium chloride flush  10-40 mL Intracatheter Q12H   sodium chloride flush  3 mL Intravenous Q12H   tamoxifen  20 mg Oral Daily   Continuous Infusions:      LOS: 4 days    Time spent: over 30 min    Fayrene Helper, MD [facility]   To contact the attending provider between 7A-7P or the covering provider during after hours 7P-7A, please log into the web site [email or web address] and access using universal [place] password for that web site. If you do not have the password, please call the hospital operator.  04/19/2022, 2:35 PM

## 2022-04-19 NOTE — Assessment & Plan Note (Signed)
Fever today with tachycardia, tachypnea, hypoxia Has been on unasyn from 5/19 -> 5/22 CXR this AM with diffuse R lung opacities, small R effusion, stable L effusion  Most likely cause would seem to be related to pneumonia Negative covid/influenza 5/21 Add on RVP today Follow MRSA PCR, repeat blood cultures Sputum culture Repeat cultures, labs from effusion Given fever on unasyn, add vanc/cefepime 5/22 -

## 2022-04-20 ENCOUNTER — Telehealth: Payer: Self-pay

## 2022-04-20 DIAGNOSIS — J9621 Acute and chronic respiratory failure with hypoxia: Secondary | ICD-10-CM | POA: Diagnosis not present

## 2022-04-20 LAB — CBC WITH DIFFERENTIAL/PLATELET
Abs Immature Granulocytes: 0.04 10*3/uL (ref 0.00–0.07)
Basophils Absolute: 0.1 10*3/uL (ref 0.0–0.1)
Basophils Relative: 1 %
Eosinophils Absolute: 0.2 10*3/uL (ref 0.0–0.5)
Eosinophils Relative: 2 %
HCT: 30.4 % — ABNORMAL LOW (ref 36.0–46.0)
Hemoglobin: 9.4 g/dL — ABNORMAL LOW (ref 12.0–15.0)
Immature Granulocytes: 0 %
Lymphocytes Relative: 6 %
Lymphs Abs: 0.6 10*3/uL — ABNORMAL LOW (ref 0.7–4.0)
MCH: 24.5 pg — ABNORMAL LOW (ref 26.0–34.0)
MCHC: 30.9 g/dL (ref 30.0–36.0)
MCV: 79.4 fL — ABNORMAL LOW (ref 80.0–100.0)
Monocytes Absolute: 1 10*3/uL (ref 0.1–1.0)
Monocytes Relative: 11 %
Neutro Abs: 7.2 10*3/uL (ref 1.7–7.7)
Neutrophils Relative %: 80 %
Platelets: 80 10*3/uL — ABNORMAL LOW (ref 150–400)
RBC: 3.83 MIL/uL — ABNORMAL LOW (ref 3.87–5.11)
RDW: 21.7 % — ABNORMAL HIGH (ref 11.5–15.5)
WBC: 9 10*3/uL (ref 4.0–10.5)
nRBC: 0 % (ref 0.0–0.2)

## 2022-04-20 LAB — COMPREHENSIVE METABOLIC PANEL
ALT: 16 U/L (ref 0–44)
AST: 32 U/L (ref 15–41)
Albumin: 3.1 g/dL — ABNORMAL LOW (ref 3.5–5.0)
Alkaline Phosphatase: 54 U/L (ref 38–126)
Anion gap: 5 (ref 5–15)
BUN: 20 mg/dL (ref 6–20)
CO2: 23 mmol/L (ref 22–32)
Calcium: 7.9 mg/dL — ABNORMAL LOW (ref 8.9–10.3)
Chloride: 108 mmol/L (ref 98–111)
Creatinine, Ser: 0.7 mg/dL (ref 0.44–1.00)
GFR, Estimated: >60 mL/min (ref >60)
Glucose, Bld: 101 mg/dL — ABNORMAL HIGH (ref 70–99)
Potassium: 4.7 mmol/L (ref 3.5–5.1)
Sodium: 136 mmol/L (ref 135–145)
Total Bilirubin: 2.7 mg/dL — ABNORMAL HIGH (ref 0.3–1.2)
Total Protein: 5.2 g/dL — ABNORMAL LOW (ref 6.5–8.1)

## 2022-04-20 LAB — CYTOLOGY - NON PAP

## 2022-04-20 LAB — GRAM STAIN

## 2022-04-20 LAB — PHOSPHORUS: Phosphorus: 2.8 mg/dL (ref 2.5–4.6)

## 2022-04-20 LAB — MAGNESIUM: Magnesium: 1.8 mg/dL (ref 1.7–2.4)

## 2022-04-20 MED ORDER — FLUCONAZOLE 150 MG PO TABS
150.0000 mg | ORAL_TABLET | Freq: Once | ORAL | Status: AC
  Administered 2022-04-20: 150 mg via ORAL
  Filled 2022-04-20: qty 1

## 2022-04-20 NOTE — Assessment & Plan Note (Signed)
Body mass index is 16.4 kg/m. RD

## 2022-04-20 NOTE — Progress Notes (Signed)
PROGRESS NOTE    Theresa Rocha  KXF:[id number] DOB: [birth year] DOA: 04/14/2022 PCP: Seward Carol, MD  Chief Complaint  Patient presents with   abnormal labs    Brief Narrative:   Theresa Rocha is Theresa Rocha pleasant 56 y.o. female with medical history significant for cancer of the right breast, hepatocellular carcinoma, cirrhosis, and recurrent left pleural effusion with PleurX catheter now presenting to the emergency department for evaluation of worsening hyponatremia.  Patient reports that she has been holding her diuretics since 04/08/2022, had outpatient blood work performed today, and was notified that her sodium had decreased to 119.  She was advised to seek further evaluation in the ED.  She was also hyperkalemic.  She's improved with midodrine and albumin.  Hospitalization has been complicated by shortness of breath secondary to her recurrent pleural effusions, volume overload, and possible pneumonia.  She's on cefepime to cover for pneumonia with fevers on 5/22.  There was plan for outpatient eval by CT surgery for recurrent L effusion, [facility].  See below for additional details    Assessment & Plan:   Principal Problem:   Acute on chronic respiratory failure with hypoxia (HCC) Active Problems:   Hyponatremia   SIRS (systemic inflammatory response syndrome) (HCC)   Hyperkalemia   Decompensated HCV cirrhosis (HCC)   Pleural effusion on left   Hypotension   Hepatocellular carcinoma (HCC)   Thrombocytopenia (HCC)   Anemia   Breast cancer (HCC)   Protein-calorie malnutrition, severe (HCC)   Assessment and Plan: * Acute on chronic respiratory failure with hypoxia (HCC) Fluctuating O2 needs, 5 L recently  Most recent CXR with increasing infiltrates diffusely throughout R lung -> pneumonia vs edema, L effusion Continue daily drainage of pleurx  Follow pleural fluid studies - no growth, cytology pending Continue abx.  Suspect worsening infiltrates represent  edema with worsening BNP, holding lasix since admission, but she developed fever 5/22 Lasix today 40 mg BID   Hyponatremia Improving at this time - complicated picture with cirrhosis with recurrent L effusion, hypotension Urine sodium <10 Now on lasix and getting daily pleurx drainage Renal has been c/s, appreciate assistanc e with volume, hyponatremia, lytes -> recommending midodrine and albumin with goal MAP >82 (now signed off, see note from 04/18/2022) Am cortisol ~10, follow ACTH stim (normal) Follow TSH (wnl), Follow BNP elevated  Hyperkalemia S/p lokelma Follow with lasix   SIRS (systemic inflammatory response syndrome) (HCC) Fever today with tachycardia, tachypnea, hypoxia Has been on unasyn from 5/19 -> 5/22 CXR this AM with diffuse R lung opacities, small R effusion, stable L effusion  Most likely cause would seem to be related to pneumonia Negative covid/influenza 5/21 Add on RVP today -> negative Follow MRSA PCR negative, repeat blood cultures NG Sputum culture Repeat cultures, labs from effusion - pending, repeat thora - transudative, culture/gram stain pending Developed fever on unasyn, transitioned to vanc/cefepime 5/22 -> with negative MRSA PCR, transition to cefepime alone.  Cefepime 5/22 - present     Hypotension Related to cirrhosis and suspected hypovolemia S/p albumin IVF on hold - watch with diuresis today midodrine   Pleural effusion on left Per Dr. 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 had reached out to CT surgery regarding possibility of  pleurodesis (appt scheduled 5/26) Echo 10/2021 with normal EF, normal diastolic parameters With worsening resp status, will plan to drain pleurx daily  Follow pending cytology,  gram stain/culture - no growth, cell count - 32% neutrophils, LDH - transudate, no fluid protein done. Will continue to discuss with CT surgery plans regarding potential procedure once she's more stable - will go ahead and [facility] eval   Decompensated HCV cirrhosis ([place]) Related to hepatitis C and etoh use disorder  Complicated by esophageal varices, portal hypertensive gastropathy, and progressive multifocal HCC s/p microwave ablation 08/2021, s/p [id number] radioembolization to R lobe of liver on 1/24 Appreciate GI assistance, follows with Roosevelt Locks NP at [facility]  Anemia will trend  Thrombocytopenia ([place]) Will continue to monitor  Breast cancer [facility]) Follows with [name]  Protein-calorie malnutrition, severe ([place]) Body mass index is 16.4 kg/m. RD      DVT prophylaxis: lovenox Code Status: full Family Communication: none Disposition:   Status is: inpatient The patient will require care spanning > 2 midnights and should be moved to inpatient because: need for GI, renal, CT surgery, oncology c/s   Consultants:  GI Renal Oncology CT surgery  Procedures:  none  Antimicrobials:  Anti-infectives (From admission, onward)    Start     Dose/Rate Route Frequency Ordered Stop   04/20/22 1600  vancomycin (VANCOCIN) IVPB 1000 mg/200 mL premix  Status:  Discontinued        1,000 mg 200 mL/hr over 60 Minutes Intravenous Every 24 hours 04/19/22 1444 04/19/22 1544   04/20/22 1400  vancomycin (VANCOREADY) IVPB 500 mg/100 mL  Status:  Discontinued        500 mg 100 mL/hr over 60 Minutes Intravenous Every 24 hours 04/19/22 1604 04/20/22 1145   04/20/22 0845  fluconazole (DIFLUCAN) tablet 150 mg        150 mg Oral  Once 04/20/22 0747 04/20/22 0941   04/19/22 1645  vancomycin (VANCOREADY) IVPB 750 mg/150 mL  Status:  Discontinued        750 mg 150 mL/hr over 60 Minutes Intravenous  Once 04/19/22 1552 04/19/22 1604    04/19/22 1645  vancomycin (VANCOREADY) IVPB 750 mg/150 mL        750 mg 150 mL/hr over 60 Minutes Intravenous  Once 04/19/22 1605 04/19/22 1753   04/19/22 1600  ceFEPIme (MAXIPIME) 2 g in sodium chloride 0.9 % 100 mL IVPB  Status:  Discontinued        2 g 200 mL/hr over 30 Minutes Intravenous Every 8 hours 04/19/22 1441 04/19/22 1545   04/19/22 1600  ceFEPIme (MAXIPIME) 2 g in sodium chloride 0.9 % 100 mL IVPB        2 g 200 mL/hr over 30 Minutes Intravenous Every 12 hours 04/19/22 1545     04/19/22 1530  vancomycin (VANCOCIN) IVPB 1000 mg/200 mL premix  Status:  Discontinued        1,000 mg 200 mL/hr over 60 Minutes Intravenous  Once 04/19/22 1441 04/19/22 1544   04/16/22 1800  Ampicillin-Sulbactam (UNASYN) 3 g in sodium chloride 0.9 % 100 mL IVPB  Status:  Discontinued        3 g 200 mL/hr over 30 Minutes Intravenous Every 6 hours 04/16/22 1139 04/19/22 1421   04/16/22 1000  Ampicillin-Sulbactam (UNASYN) 3 g in sodium chloride 0.9 % 100 mL IVPB  Status:  Discontinued        3 g 200 mL/hr over 30  Minutes Intravenous Every 6 hours 04/16/22 0935 04/16/22 1139       Subjective: Breathing feels better today  Objective: Vitals:   04/19/22 2300 04/20/22 0100 04/20/22 0603 04/20/22 1119  BP: (!) 86/64 (!) 84/57 91/72 (!) 92/56  Pulse: 90 85  93  Resp: 18 (!) 23  (!) 28  Temp:  98.1 F (36.7 C) 98.1 F (36.7 C) 98.8 F (37.1 C)  TempSrc:  Oral Oral Oral  SpO2: 93% 94%  95%  Weight:   42 kg   Height:        Intake/Output Summary (Last 24 hours) at 04/20/2022 1517 Last data filed at 04/20/2022 6712 Gross per 24 hour  Intake 580 ml  Output 1025 ml  Net -445 ml   Filed Weights   04/16/22 0500 04/19/22 1536 04/20/22 0603  Weight: 49.8 kg 39.5 kg 42 kg    Examination:  General: No acute distress. Cardiovascular: RRR Lungs: diminished on L, L pleurx Abdomen: Soft, nontender, nondistended  Neurological: Alert and oriented 3. Moves all extremities 4 with equal strength.  Cranial nerves II through XII grossly intact. Extremities: No clubbing or cyanosis. No edema.   Data Reviewed: I have personally reviewed following labs and imaging studies  CBC: Recent Labs  Lab 04/14/22 1619 04/15/22 0503 04/16/22 0457 04/18/22 0442 04/19/22 0507 04/20/22 0347  WBC 9.7 7.7 11.5* 10.8* 9.1 9.0  NEUTROABS 7.2  --  9.8* 9.2* 7.3 7.2  HGB 12.5 11.0* 9.5* 9.0* 9.0* 9.4*  HCT 38.7 35.0* 30.7* 29.1* 28.3* 30.4*  MCV 77.7* 78.8* 80.4 79.1* 79.5* 79.4*  PLT 125* 104* 78* 79* 79* 80*    Basic Metabolic Panel: Recent Labs  Lab 04/14/22 1619 04/15/22 0503 04/16/22 0457 04/16/22 4580 04/16/22 1342 04/16/22 2044 04/17/22 0520 04/18/22 0442 04/19/22 0507 04/20/22 0347  NA 124*   < > 123* 125*   < > 125* 130* 133* 136 136  K 6.0*   < > 4.6 4.5  --   --  3.8 3.4* 3.7 4.7  CL 97*   < > 99 100  --   --  103 103 106 108  CO2 21*   < > 18* 18*  --   --  19* 20* 22 23  GLUCOSE 94   < > 91 96  --   --  79 100* 127* 101*  BUN 13   < > 11 10  --   --  '11 16 16 20  '$ CREATININE 0.54   < > 0.68 0.57  --   --  0.59 0.60 0.72 0.70  CALCIUM 8.4*   < > 7.3* 7.8*  --   --  8.2* 8.2* 8.0* 7.9*  MG 2.1  --  1.7  --   --   --   --  1.8 1.9 1.8  PHOS  --    < > 2.6  --   --   --  2.5 2.5 2.6 2.8   < > = values in this interval not displayed.    GFR: Estimated Creatinine Clearance: 52.7 mL/min (by C-G formula based on SCr of 0.7 mg/dL).  Liver Function Tests: Recent Labs  Lab 04/15/22 0503 04/16/22 0457 04/17/22 0520 04/18/22 0442 04/19/22 0507 04/20/22 0347  AST 50* 39 31  --  33 32  ALT '28 20 18  '$ --  15 16  ALKPHOS 78 58 48  --  56 54  BILITOT 1.9* 2.1* 2.5*  --  2.2* 2.7*  PROT 5.3* 5.2* 5.4*  --  5.6* 5.2*  ALBUMIN 2.2* 3.1* 3.4* 4.2 3.5 3.1*    CBG: No results for input(s): GLUCAP in the last 168 hours.   Recent Results (from the past 240 hour(s))  Body fluid culture w Gram Stain     Status: None   Collection Time: 04/16/22  8:38 AM   Specimen: Pleura; Body  Fluid  Result Value Ref Range Status   Specimen Description   Final    PLEURAL LT Performed at [facility] [address], [place], [place] [address]    Special Requests   Final    Normal Performed at [facility], [place] [address], [place], [place] [address]    Gram Stain   Final    WBC PRESENT, PREDOMINANTLY PMN NO ORGANISMS SEEN CYTOSPIN SMEAR    Culture   Final    NO GROWTH 3 DAYS Performed at [facility], [place] [address], [place], [place] [address]    Report Status 04/19/2022 FINAL  Final  Resp Panel by RT-PCR (Flu Trystan Eads&B, Covid) Nasopharyngeal Swab     Status: None   Collection Time: 04/18/22  9:52 AM   Specimen: Nasopharyngeal Swab; Nasopharyngeal(NP) swabs in vial transport medium  Result Value Ref Range Status   SARS Coronavirus 2 by RT PCR NEGATIVE NEGATIVE Final    Comment: (NOTE) SARS-CoV-2 target nucleic acids are NOT DETECTED.  The SARS-CoV-2 RNA is generally detectable in upper respiratory specimens during the acute phase of infection. The lowest concentration of SARS-CoV-2 viral copies this assay can detect is 138 copies/mL. Grantley Savage negative result does not preclude SARS-Cov-2 infection and should not be used as the sole basis for treatment or other patient management decisions. Rafal Archuleta negative result may occur with  improper specimen collection/handling, submission of specimen other than nasopharyngeal swab, presence of viral mutation(s) within the areas targeted by this assay, and inadequate number of viral copies(<138 copies/mL). Treavon Castilleja negative result must be combined with clinical observations, patient history, and epidemiological information. The expected result is Negative.  Fact Sheet for Patients:  EntrepreneurPulse.com.au  Fact Sheet for Healthcare Providers:  IncredibleEmployment.be  This test is no t yet approved or cleared by the [place] FDA and  has been authorized for  detection and/or diagnosis of SARS-CoV-2 by FDA under an Emergency Use Authorization (EUA). This EUA will remain  in effect (meaning this test can be used) for the duration of the COVID-19 declaration under Section 564(b)(1) of the Act, 21 U.S.C.section 360bbb-3(b)(1), unless the authorization is terminated  or revoked sooner.       Influenza Issaiah Seabrooks by PCR NEGATIVE NEGATIVE Final   Influenza B by PCR NEGATIVE NEGATIVE Final    Comment: (NOTE) The Xpert Xpress SARS-CoV-2/FLU/RSV plus assay is intended as an aid in the diagnosis of influenza from Nasopharyngeal swab specimens and should not be used as Vanderbilt Ranieri sole basis for treatment. Nasal washings and aspirates are unacceptable for Xpert Xpress SARS-CoV-2/FLU/RSV testing.  Fact Sheet for Patients: EntrepreneurPulse.com.au  Fact Sheet for Healthcare Providers: IncredibleEmployment.be  This test is not yet approved or cleared by the [place] FDA and has been authorized for detection and/or diagnosis of SARS-CoV-2 by FDA under an Emergency Use Authorization (EUA). This EUA will remain in effect (meaning this test can be used) for the duration of the COVID-19 declaration under Section 564(b)(1) of the Act, 21 U.S.C. section 360bbb-3(b)(1), unless the authorization is terminated or revoked.  Performed at [facility], [place] [address], [place], [place] [address]  MRSA Next Gen by PCR, Nasal     Status: None   Collection Time: 04/19/22  2:23 PM   Specimen: Nasal Mucosa; Nasal Swab  Result Value Ref Range Status   MRSA by PCR Next Gen NOT DETECTED NOT DETECTED Final    Comment: (NOTE) The GeneXpert MRSA Assay (FDA approved for NASAL specimens only), is one component of Emanuel Campos comprehensive MRSA colonization surveillance program. It is not intended to diagnose MRSA infection nor to guide or monitor treatment for MRSA infections. Test performance is not FDA approved in patients less than 55  years old. Performed at [facility], [place] [address], [place], [place] [address]   Gram stain     Status: None (Preliminary result)   Collection Time: 04/19/22  2:23 PM   Specimen: Pleura  Result Value Ref Range Status   Specimen Description PLEURAL  Final   Special Requests NONE  Final   Gram Stain   Final    WBC PRESENT,BOTH PMN AND MONONUCLEAR NO ORGANISMS SEEN CYTOSPIN SMEAR Performed at [facility], [address] [place], [place] [address]    Report Status PENDING  Incomplete  Respiratory (~20 pathogens) panel by PCR     Status: None   Collection Time: 04/19/22  2:59 PM  Result Value Ref Range Status   Adenovirus NOT DETECTED NOT DETECTED Final   Coronavirus 229E NOT DETECTED NOT DETECTED Final    Comment: (NOTE) The Coronavirus on the Respiratory Panel, DOES NOT test for the novel  Coronavirus (2019 nCoV)    Coronavirus HKU1 NOT DETECTED NOT DETECTED Final   Coronavirus NL63 NOT DETECTED NOT DETECTED Final   Coronavirus OC43 NOT DETECTED NOT DETECTED Final   Metapneumovirus NOT DETECTED NOT DETECTED Final   Rhinovirus / Enterovirus NOT DETECTED NOT DETECTED Final   Influenza Jearlean Demauro NOT DETECTED NOT DETECTED Final   Influenza B NOT DETECTED NOT DETECTED Final   Parainfluenza Virus 1 NOT DETECTED NOT DETECTED Final   Parainfluenza Virus 2 NOT DETECTED NOT DETECTED Final   Parainfluenza Virus 3 NOT DETECTED NOT DETECTED Final   Parainfluenza Virus 4 NOT DETECTED NOT DETECTED Final   Respiratory Syncytial Virus NOT DETECTED NOT DETECTED Final   Bordetella pertussis NOT DETECTED NOT DETECTED Final   Bordetella Parapertussis NOT DETECTED NOT DETECTED Final   Chlamydophila pneumoniae NOT DETECTED NOT DETECTED Final   Mycoplasma pneumoniae NOT DETECTED NOT DETECTED Final    Comment: Performed at [facility], [place] [address], [place], [place] [address]  Culture, blood (Routine X 2) w Reflex to ID Panel     Status: None (Preliminary result)    Collection Time: 04/19/22  3:35 PM   Specimen: BLOOD  Result Value Ref Range Status   Specimen Description   Final    BLOOD BLOOD LEFT FOREARM Performed at [facility] [address] [place], [place] [address]    Special Requests   Final    BOTTLES DRAWN AEROBIC AND ANAEROBIC Blood Culture adequate volume Performed at [facility] [address] [place], [place] [address]    Culture   Final    NO GROWTH < 24 HOURS Performed at [facility] [address] [place],  [address]    Report Status PENDING  Incomplete  Culture, blood (Routine X 2) w Reflex to ID Panel     Status: None (Preliminary result)   Collection Time: 04/19/22  3:35 PM   Specimen: BLOOD  Result Value Ref  Range Status   Specimen Description   Final    BLOOD LEFT ANTECUBITAL Performed at [facility] [address] [place], [place] [address]    Special Requests   Final    BOTTLES DRAWN AEROBIC AND ANAEROBIC Blood Culture results may not be optimal due to an inadequate volume of blood received in culture bottles Performed at [facility] [address], [place], [place] [address]    Culture   Final    NO GROWTH < 24 HOURS Performed at [facility] [address], [place], [place] [address]    Report Status PENDING  Incomplete         Radiology Studies: DG CHEST PORT 1 VIEW  Result Date: 04/19/2022 CLINICAL DATA:  Follow-up chest tube EXAM: PORTABLE CHEST 1 VIEW COMPARISON:  04/18/2022 FINDINGS: Right upper extremity PICC tip overlies the superior cavoatrial junction. Unchanged cardiomediastinal silhouette. Unchanged position of left apical chest tube. There is no visible pneumothorax. Stable left pleural effusion with adjacent basilar atelectasis. Unchanged diffuse right lung opacities and probable small right pleural effusion. No acute osseous abnormality. Surgical clips overlie the right hemithorax.  IMPRESSION: Stable left apical chest tube without visible pneumothorax. Stable left pleural effusion and adjacent left basilar atelectasis. Stable diffuse right lung opacities and small right pleural effusion. Electronically Signed   By: Maurine Simmering M.D.   On: 04/19/2022 08:06        Scheduled Meds:  Chlorhexidine Gluconate Cloth  6 each Topical Daily   enoxaparin (LOVENOX) injection  30 mg Subcutaneous Q24H   feeding supplement (KATE FARMS STANDARD 1.4)  325 mL Oral TID BM   furosemide  40 mg Intravenous BID   midodrine  12.5 mg Oral TID WC   multivitamin with minerals  1 tablet Oral Daily   nicotine  21 mg Transdermal Daily   pantoprazole  40 mg Oral Daily   sodium chloride flush  10-40 mL Intracatheter Q12H   sodium chloride flush  3 mL Intravenous Q12H   tamoxifen  20 mg Oral Daily   Continuous Infusions:  ceFEPime (MAXIPIME) IV 2 g (04/20/22 1512)       LOS: 5 days    Time spent: over 30 min    Fayrene Helper, MD [facility]   To contact the attending provider between 7A-7P or the covering provider during after hours 7P-7A, please log into the web site [email or web address] and access using universal [place] password for that web site. If you do not have the password, please call the hospital operator.  04/20/2022, 3:17 PM

## 2022-04-20 NOTE — Progress Notes (Signed)
Oncology brief note   I stopped by to see Theresa Rocha, she had worsening shortness of breath over the weekend, which has improved since Lasix was restarted.  Her sodium has normalized.  She is feeling better today.  She is anxious to see if she can have pleurodesis soon.  I have reviewed her chart, per Dr. Florene Glen, the plan is to transfer her to [facility] so she can be evaluated by cardiothoracic surgeon Dr. Kipp Brood, to see if she is a candidate for pleurodesis and pleural biopsy.  Given the debilitating recurrent left pleural effusion, and difficult management, I hope Dr. Kipp Brood is able to offer pleurodesis and pleural biopsy during this hospital stay.   I plan to repeat her abdominal MRI after discharge for her [name] follow up.   I will f/u as needed.   Truitt Merle  04/20/2022

## 2022-04-20 NOTE — Progress Notes (Signed)
Pt. Is going to be transferred to [facility], PICC routine care done, w/ good blood return, flushed w/ saline, capped.

## 2022-04-20 NOTE — Plan of Care (Signed)
  Problem: Clinical Measurements: Goal: Ability to maintain clinical measurements within normal limits will improve Outcome: Progressing   

## 2022-04-20 NOTE — Telephone Encounter (Signed)
Pt called stating she is still admitted in [facility] and wants [name] to please give her a call regarding the next steps in her plan of care.  Pt stated she has not seen the Thoracic Surgeon and want to know how much longer she needs to be admitted.  Notified [name] of the pt's concerns and asked if she could please contact pt.

## 2022-04-21 ENCOUNTER — Telehealth: Payer: Self-pay | Admitting: Nurse Practitioner

## 2022-04-21 DIAGNOSIS — E871 Hypo-osmolality and hyponatremia: Secondary | ICD-10-CM | POA: Diagnosis not present

## 2022-04-21 DIAGNOSIS — J9621 Acute and chronic respiratory failure with hypoxia: Secondary | ICD-10-CM | POA: Diagnosis not present

## 2022-04-21 DIAGNOSIS — E43 Unspecified severe protein-calorie malnutrition: Secondary | ICD-10-CM

## 2022-04-21 DIAGNOSIS — C220 Liver cell carcinoma: Secondary | ICD-10-CM | POA: Diagnosis not present

## 2022-04-21 DIAGNOSIS — B1920 Unspecified viral hepatitis C without hepatic coma: Secondary | ICD-10-CM | POA: Diagnosis not present

## 2022-04-21 DIAGNOSIS — I959 Hypotension, unspecified: Secondary | ICD-10-CM

## 2022-04-21 DIAGNOSIS — J90 Pleural effusion, not elsewhere classified: Secondary | ICD-10-CM | POA: Diagnosis not present

## 2022-04-21 DIAGNOSIS — R6510 Systemic inflammatory response syndrome (SIRS) of non-infectious origin without acute organ dysfunction: Secondary | ICD-10-CM

## 2022-04-21 DIAGNOSIS — D696 Thrombocytopenia, unspecified: Secondary | ICD-10-CM

## 2022-04-21 LAB — CBC WITH DIFFERENTIAL/PLATELET
Abs Immature Granulocytes: 0.06 10*3/uL (ref 0.00–0.07)
Basophils Absolute: 0.1 10*3/uL (ref 0.0–0.1)
Basophils Relative: 1 %
Eosinophils Absolute: 0.2 10*3/uL (ref 0.0–0.5)
Eosinophils Relative: 2 %
HCT: 28.9 % — ABNORMAL LOW (ref 36.0–46.0)
Hemoglobin: 9.3 g/dL — ABNORMAL LOW (ref 12.0–15.0)
Immature Granulocytes: 1 %
Lymphocytes Relative: 8 %
Lymphs Abs: 0.7 10*3/uL (ref 0.7–4.0)
MCH: 24.9 pg — ABNORMAL LOW (ref 26.0–34.0)
MCHC: 32.2 g/dL (ref 30.0–36.0)
MCV: 77.5 fL — ABNORMAL LOW (ref 80.0–100.0)
Monocytes Absolute: 1.1 10*3/uL — ABNORMAL HIGH (ref 0.1–1.0)
Monocytes Relative: 13 %
Neutro Abs: 6.5 10*3/uL (ref 1.7–7.7)
Neutrophils Relative %: 75 %
Platelets: UNDETERMINED 10*3/uL (ref 150–400)
RBC: 3.73 MIL/uL — ABNORMAL LOW (ref 3.87–5.11)
RDW: 21.2 % — ABNORMAL HIGH (ref 11.5–15.5)
WBC: 8.5 10*3/uL (ref 4.0–10.5)
nRBC: 0 % (ref 0.0–0.2)

## 2022-04-21 LAB — COMPREHENSIVE METABOLIC PANEL
ALT: 18 U/L (ref 0–44)
AST: 37 U/L (ref 15–41)
Albumin: 2.7 g/dL — ABNORMAL LOW (ref 3.5–5.0)
Alkaline Phosphatase: 60 U/L (ref 38–126)
Anion gap: 9 (ref 5–15)
BUN: 20 mg/dL (ref 6–20)
CO2: 23 mmol/L (ref 22–32)
Calcium: 8.1 mg/dL — ABNORMAL LOW (ref 8.9–10.3)
Chloride: 102 mmol/L (ref 98–111)
Creatinine, Ser: 0.63 mg/dL (ref 0.44–1.00)
GFR, Estimated: >60 mL/min (ref >60)
Glucose, Bld: 96 mg/dL (ref 70–99)
Potassium: 3.9 mmol/L (ref 3.5–5.1)
Sodium: 134 mmol/L — ABNORMAL LOW (ref 135–145)
Total Bilirubin: 2.8 mg/dL — ABNORMAL HIGH (ref 0.3–1.2)
Total Protein: 4.9 g/dL — ABNORMAL LOW (ref 6.5–8.1)

## 2022-04-21 LAB — PHOSPHORUS: Phosphorus: 3.6 mg/dL (ref 2.5–4.6)

## 2022-04-21 LAB — MAGNESIUM: Magnesium: 1.5 mg/dL — ABNORMAL LOW (ref 1.7–2.4)

## 2022-04-21 MED ORDER — KATE FARMS STANDARD 1.4 PO LIQD
325.0000 mL | Freq: Every day | ORAL | Status: DC
  Administered 2022-04-21 – 2022-05-02 (×10): 325 mL via ORAL
  Filled 2022-04-21 (×13): qty 325

## 2022-04-21 MED ORDER — ONDANSETRON HCL 4 MG/2ML IJ SOLN
4.0000 mg | Freq: Once | INTRAMUSCULAR | Status: AC
Start: 2022-04-21 — End: 2022-04-21
  Administered 2022-04-21: 4 mg via INTRAVENOUS
  Filled 2022-04-21: qty 2

## 2022-04-21 MED ORDER — MAGNESIUM OXIDE -MG SUPPLEMENT 400 (240 MG) MG PO TABS
400.0000 mg | ORAL_TABLET | Freq: Two times a day (BID) | ORAL | Status: DC
  Administered 2022-04-21 – 2022-05-03 (×25): 400 mg via ORAL
  Filled 2022-04-21 (×25): qty 1

## 2022-04-21 NOTE — Telephone Encounter (Signed)
Inbound call from patient stating she is currently a in patient at [facility] and I wanted to speak to nurse. Please advise.

## 2022-04-21 NOTE — Consult Note (Signed)
[address]       [place] [address]             [phone]                    Heiress M Rocha [place] Medical Record #[id number] Date of Birth: January 06, 1966  Referring: No ref. provider found Primary Care: Seward Carol, MD Primary Cardiologist: None  Chief Complaint:    Chief Complaint  Patient presents with   abnormal labs    History of Present Illness:    Theresa Rocha 56 y.o. female with history of right-sided breast cancer, hepatocellular carcinoma, cirrhosis, and recurrent left pleural effusions is transferred from [facility] for management of her recurrent effusion.  She originally was admitted for low sodiums and was managed as an inpatient until this was corrected.  She states that in October she began experiencing some shortness of breath and was diagnosed with a left pleural effusion.  This came shortly after completion of her ablation therapy to her liver for her [place].  She has had multiple thoracentesis, and also had a Pleurx catheter placed.  She drained over a liter daily but continues to have significant shortness of breath.  Cross-sectional imaging shows a large obvious nodular disease in the pleural space    Past Medical History:  Diagnosis Date   Anemia    Breast cancer ([place]) 08/30/2019   Cancer ([place])    liver- just diagnosed, not started treatment yet   Cervicalgia    COPD (chronic obstructive pulmonary disease) (HCC)    Esophageal varices (HCC)    GERD (gastroesophageal reflux disease)    Headache    Hepatic cirrhosis (HCC)    Hepatitis C    resolved 2018   Hepatocellular carcinoma (HCC)    IBS (irritable bowel syndrome)    Lactose intolerance    Personal history of radiation therapy 11/2019   Right breast   Portal hypertension ([place])    Raynaud's syndrome    Thrombocytopenia ([place])     Past Surgical History:  Procedure Laterality Date   BREAST BIOPSY Right 09/2019   BREAST LUMPECTOMY Right 09/2019   BREAST LUMPECTOMY WITH  AXILLARY LYMPH NODE BIOPSY Right 10/16/2019   Procedure: RIGHT BREAST LUMPECTOMY WITH SENTINEL LYMPH NODE BIOPSY;  Surgeon: Stark Klein, MD;  Location: [facility];  Service: General;  Laterality: Right;   COLONOSCOPY     ESOPHAGEAL BANDING  10/27/2021   Procedure: ESOPHAGEAL BANDING;  Surgeon: Irene Shipper, MD;  Location: [facility];  Service: Endoscopy;;   ESOPHAGOGASTRODUODENOSCOPY (EGD) WITH PROPOFOL N/A 10/27/2021   Procedure: ESOPHAGOGASTRODUODENOSCOPY (EGD) WITH PROPOFOL;  Surgeon: Irene Shipper, MD;  Location: [facility];  Service: Endoscopy;  Laterality: N/A;   ESOPHAGOGASTRODUODENOSCOPY (EGD) WITH PROPOFOL N/A 01/04/2022   Procedure: ESOPHAGOGASTRODUODENOSCOPY (EGD) WITH PROPOFOL;  Surgeon: Irene Shipper, MD;  Location: [facility];  Service: Endoscopy;  Laterality: N/A;   HAMMER TOE SURGERY Left 2012   pins placed in great toe, 2nd,3rd, 4th toes, all pins removed except great toe   IR ANGIOGRAM SELECTIVE EACH ADDITIONAL VESSEL  12/16/2021   IR ANGIOGRAM SELECTIVE EACH ADDITIONAL VESSEL  12/16/2021   IR ANGIOGRAM SELECTIVE EACH ADDITIONAL VESSEL  12/22/2021   IR ANGIOGRAM VISCERAL SELECTIVE  12/16/2021   IR ANGIOGRAM VISCERAL SELECTIVE  12/22/2021   IR EMBO TUMOR ORGAN ISCHEMIA INFARCT INC GUIDE ROADMAPPING  12/22/2021   IR PARACENTESIS  11/13/2019   IR PARACENTESIS  03/04/2022   IR  PERC PLEURAL DRAIN W/INDWELL CATH W/IMG GUIDE  03/09/2022   IR RADIOLOGIST EVAL & MGMT  08/16/2019   IR RADIOLOGIST EVAL & MGMT  02/06/2020   IR RADIOLOGIST EVAL & MGMT  06/26/2020   IR RADIOLOGIST EVAL & MGMT  12/31/2020   IR RADIOLOGIST EVAL & MGMT  07/21/2021   IR RADIOLOGIST EVAL & MGMT  10/08/2021   IR RADIOLOGIST EVAL & MGMT  01/13/2022   IR RADIOLOGIST EVAL & MGMT  03/05/2022   IR THORACENTESIS ASP PLEURAL SPACE W/IMG GUIDE  10/12/2021   IR THORACENTESIS ASP PLEURAL SPACE W/IMG GUIDE  10/26/2021   IR THORACENTESIS ASP PLEURAL SPACE W/IMG GUIDE  11/19/2021   IR THORACENTESIS ASP PLEURAL SPACE W/IMG GUIDE   12/22/2021   IR US GUIDE VASC ACCESS RIGHT  12/16/2021   IR US GUIDE VASC ACCESS RIGHT  12/22/2021   PELVIC LAPAROSCOPY Bilateral 1992   RADIOLOGY WITH ANESTHESIA N/A 09/16/2021   Procedure: CT MICROWAVE ABLATION;  Surgeon: Aletta Edouard, MD;  Location: [facility];  Service: Radiology;  Laterality: N/A;   UPPER GASTROINTESTINAL ENDOSCOPY  2002   had esophageal dilitation    Family History  Problem Relation Age of Onset   Liver cancer Mother    Lung cancer Mother    Lung cancer Father    Liver cancer Maternal Grandmother    Colon cancer Neg Hx    Esophageal cancer Neg Hx    Stomach cancer Neg Hx    Rectal cancer Neg Hx      Social History   Tobacco Use  Smoking Status Every Day   Packs/day: 0.50   Years: 30.00   Pack years: 15.00   Types: Cigarettes   Start date: 11/29/1978  Smokeless Tobacco Never    Social History   Substance and Sexual Activity  Alcohol Use Not Currently   Alcohol/week: 6.0 standard drinks   Types: 6 Cans of beer per week   Comment: stopped 06/2019     Allergies  Allergen Reactions   Hydromorphone Hcl Nausea And Vomiting    Severe vomiting   Aspirin Nausea Only    Current Facility-Administered Medications  Medication Dose Route Frequency Provider Last Rate Last Admin   acetaminophen (TYLENOL) tablet 650 mg  650 mg Oral Q6H PRN Opyd, Ilene Qua, MD   650 mg at 04/19/22 1322   Or   acetaminophen (TYLENOL) suppository 650 mg  650 mg Rectal Q6H PRN Opyd, Ilene Qua, MD       albuterol (PROVENTIL) (2.5 MG/3ML) 0.083% nebulizer solution 2.5 mg  2.5 mg Nebulization Q4H PRN Elodia Florence., MD   2.5 mg at 04/19/22 1052   baclofen (LIORESAL) tablet 5 mg  5 mg Oral Daily PRN Opyd, Ilene Qua, MD       ceFEPIme (MAXIPIME) 2 g in sodium chloride 0.9 % 100 mL IVPB  2 g Intravenous Q12H Luiz Ochoa, RPH 200 mL/hr at 04/21/22 0427 2 g at 04/21/22 0427   Chlorhexidine Gluconate Cloth 2 % PADS 6 each  6 each Topical Daily Elodia Florence., MD   6  each at 04/21/22 0947   dicyclomine (BENTYL) tablet 10 mg  10 mg Oral Q6H PRN Opyd, Ilene Qua, MD       enoxaparin (LOVENOX) injection 30 mg  30 mg Subcutaneous Q24H Opyd, Ilene Qua, MD   30 mg at 04/20/22 2210   feeding supplement (KATE FARMS STANDARD 1.4) liquid 325 mL  325 mL Oral Daily Pokhrel, Corrie Mckusick, MD  furosemide (LASIX) injection 40 mg  40 mg Intravenous BID Elodia Florence., MD   40 mg at 04/21/22 0941   guaiFENesin-dextromethorphan (ROBITUSSIN DM) 100-10 MG/5ML syrup 5 mL  5 mL Oral Q4H PRN Elodia Florence., MD   5 mL at 04/16/22 0549   magnesium oxide (MAG-OX) tablet 400 mg  400 mg Oral BID Pokhrel, Laxman, MD   400 mg at 04/21/22 0958   midodrine (PROAMATINE) tablet 12.5 mg  12.5 mg Oral TID WC Roney Jaffe, MD   12.5 mg at 04/21/22 1235   multivitamin with minerals tablet 1 tablet  1 tablet Oral Daily Elodia Florence., MD   1 tablet at 04/21/22 0941   nicotine (NICODERM CQ - dosed in mg/24 hours) patch 21 mg  21 mg Transdermal Daily Elodia Florence., MD   21 mg at 04/21/22 4580   nicotine polacrilex (NICORETTE) gum 2 mg  2 mg Oral PRN Elodia Florence., MD       oxyCODONE (Oxy IR/ROXICODONE) immediate release tablet 5-10 mg  5-10 mg Oral Q4H PRN Opyd, Ilene Qua, MD   10 mg at 04/21/22 1235   pantoprazole (PROTONIX) EC tablet 40 mg  40 mg Oral Daily Opyd, Ilene Qua, MD   40 mg at 04/21/22 0941   phenol (CHLORASEPTIC) mouth spray 1 spray  1 spray Mouth/Throat PRN Elodia Florence., MD       senna-docusate (Senokot-S) tablet 1 tablet  1 tablet Oral QHS PRN Vianne Bulls, MD   1 tablet at 04/16/22 0718   sodium chloride flush (NS) 0.9 % injection 10-40 mL  10-40 mL Intracatheter Q12H Elodia Florence., MD   10 mL at 04/21/22 0945   sodium chloride flush (NS) 0.9 % injection 10-40 mL  10-40 mL Intracatheter PRN Elodia Florence., MD       sodium chloride flush (NS) 0.9 % injection 3 mL  3 mL Intravenous Q12H Opyd, Ilene Qua, MD   3 mL at  04/21/22 0945   tamoxifen (NOLVADEX) tablet 20 mg  20 mg Oral Daily Opyd, Ilene Qua, MD   20 mg at 04/21/22 9983    Review of Systems  Constitutional:  Positive for malaise/fatigue.  Respiratory:  Positive for cough and shortness of breath.    PHYSICAL EXAMINATION: BP 103/76 (BP Location: Left Arm)   Pulse 84   Temp 98.4 F (36.9 C) (Oral)   Resp 18   Ht '5\' 3"'$  (1.6 m)   Wt 40.9 kg   LMP 04/20/2013   SpO2 97%   BMI 15.97 kg/m   Physical Exam Constitutional:      General: She is not in acute distress.    Appearance: She is ill-appearing.  Cardiovascular:     Rate and Rhythm: Normal rate.  Pulmonary:     Effort: Pulmonary effort is normal. No respiratory distress.  Abdominal:     General: Abdomen is flat.  Musculoskeletal:        General: Normal range of motion.     Cervical back: Normal range of motion.  Skin:    General: Skin is warm and dry.  Neurological:     General: No focal deficit present.     Mental Status: She is alert and oriented to person, place, and time.     Diagnostic Studies & Laboratory data:     Recent Radiology Findings:   DG Chest 2 View  Result Date: 04/08/2022 CLINICAL DATA:  Pleural effusion.  EXAM: CHEST - 2 VIEW COMPARISON:  March 05, 2022. FINDINGS: The heart size and mediastinal contours are within normal limits. Right lung is clear. Interval placement left-sided chest tube. Moderate left pleural effusion is noted. No definite pneumothorax is noted. The visualized skeletal structures are unremarkable. IMPRESSION: Interval placement of left-sided chest tube. Moderate left pleural effusion is noted. Electronically Signed   By: Marijo Conception M.D.   On: 04/08/2022 15:25   DG CHEST PORT 1 VIEW  Result Date: 04/19/2022 CLINICAL DATA:  Follow-up chest tube EXAM: PORTABLE CHEST 1 VIEW COMPARISON:  04/18/2022 FINDINGS: Right upper extremity PICC tip overlies the superior cavoatrial junction. Unchanged cardiomediastinal silhouette. Unchanged position  of left apical chest tube. There is no visible pneumothorax. Stable left pleural effusion with adjacent basilar atelectasis. Unchanged diffuse right lung opacities and probable small right pleural effusion. No acute osseous abnormality. Surgical clips overlie the right hemithorax. IMPRESSION: Stable left apical chest tube without visible pneumothorax. Stable left pleural effusion and adjacent left basilar atelectasis. Stable diffuse right lung opacities and small right pleural effusion. Electronically Signed   By: Maurine Simmering M.D.   On: 04/19/2022 08:06   DG CHEST PORT 1 VIEW  Result Date: 04/18/2022 CLINICAL DATA:  Follow-up chest x-ray EXAM: PORTABLE CHEST 1 VIEW COMPARISON:  Apr 17, 2022 FINDINGS: A right PICC line terminates in the central SVC. A left chest tube is stable. No pneumothorax. The cardiomediastinal silhouette is stable. There is a left-sided pleural effusion with underlying atelectasis. Infiltrate diffusely throughout the right lung has worsened. There is probably a small right effusion. No other changes. IMPRESSION: 1. Support apparatus as above. 2. Increasing infiltrate diffusely throughout the right lung could represent pneumonia or asymmetric edema. An infectious process is favored. Recommend clinical correlation. 3. Left pleural effusion with underlying atelectasis. Electronically Signed   By: Dorise Bullion III M.D.   On: 04/18/2022 08:01   DG CHEST PORT 1 VIEW  Result Date: 04/17/2022 CLINICAL DATA:  Follow-up pleural effusion.  Chest tube. EXAM: PORTABLE CHEST 1 VIEW COMPARISON:  Apr 16, 2022 FINDINGS: The left chest tube is been reposition with the tip now located in the superior most aspect of the left pleural space. No pneumothorax. The left-sided pleural effusion is much smaller in the interval but remains. There is probably a tiny right pleural effusion. Atelectasis is associated with the left effusion. Opacity in the right mid lower lung is mildly worsened. No other changes.  IMPRESSION: 1. Repositioning of left chest tube as above. The left pleural effusion is smaller in the interval. 2. Infiltrate in the right mid lower lung is mildly worsened in the interval. Recommend attention on follow-up. 3. Opacity associated with the left-sided effusion is likely atelectasis. Electronically Signed   By: Dorise Bullion III M.D.   On: 04/17/2022 09:29   DG CHEST PORT 1 VIEW  Result Date: 04/16/2022 CLINICAL DATA:  Follow-up pleural effusion. EXAM: PORTABLE CHEST 1 VIEW COMPARISON:  Portable chest May 17 at 5:03 AM FINDINGS: 4:58 a.m., 04/16/2022. Left chest tube in place with tip in the apex, unchanged. There is no visible pneumothorax. There is a large layering left pleural effusion increased in volume obscuring the lower 2/3 of the left chest. The mediastinum has shifted to the right but is stable in configuration overall. The cardiac size is normal. There is increased patchy opacity in the right lower lung field consistent with pneumonia or aspiration most likely. Right upper lung field and left apex remain clear. The right sulci  are sharp. Thoracic cage intact. Scattered right chest surgical clips. IMPRESSION: 1. Large left pleural effusion, significantly increased since May 17 in spite of the chest tube placement. 2. Increased patchy consolidation right lower lung field most likely due to pneumonia or aspiration. Electronically Signed   By: Telford Nab M.D.   On: 04/16/2022 06:23   DG Chest Portable 1 View  Result Date: 04/14/2022 CLINICAL DATA:  Pleural effusion, pleural drain with large amounts of drainage history of liver tumor. EXAM: PORTABLE CHEST 1 VIEW COMPARISON:  Apr 08, 2022. FINDINGS: Trachea midline. Cardiomediastinal contours and hilar structures are stable. No visible pneumothorax with pleural drain in stable position, tip at the LEFT lung apex. Similar volume of pleural fluid when compared to the study of Apr 08, 2022. Surgical clips project over the RIGHT chest and  axilla. On limited assessment there is no acute skeletal process. IMPRESSION: Stable chest x-ray. Pleural drain remains in place with moderate LEFT-sided pleural effusion. Electronically Signed   By: Zetta Bills M.D.   On: 04/14/2022 17:17   [place] EKG SITE RITE  Result Date: 04/17/2022 If Site Rite image not attached, placement could not be confirmed due to current cardiac rhythm.  [place] EKG SITE RITE  Result Date: 04/17/2022 If Site Rite image not attached, placement could not be confirmed due to current cardiac rhythm.      I have independently reviewed the above radiology studies  and reviewed the findings with the patient.   Recent Lab Findings: Lab Results  Component Value Date   WBC 8.5 04/21/2022   HGB 9.3 (L) 04/21/2022   HCT 28.9 (L) 04/21/2022   PLT PLATELET CLUMPS NOTED ON SMEAR, UNABLE TO ESTIMATE 04/21/2022   GLUCOSE 96 04/21/2022   ALT 18 04/21/2022   AST 37 04/21/2022   NA 134 (L) 04/21/2022   K 3.9 04/21/2022   CL 102 04/21/2022   CREATININE 0.63 04/21/2022   BUN 20 04/21/2022   CO2 23 04/21/2022   TSH 2.282 04/16/2022   INR 1.7 (H) 04/16/2022     Assessment / Plan:   56 year old female with a recurrent left pleural effusion.  This is occurred in the setting of ablation therapy to her liver for [facility].  She does have a history of right-sided breast cancer (the onset of this in relation to think that this may be pleural ascites.  I have connected her Pleurx catheter to a Pleur-evac and have already drained off a liter.  The goal is to keep this space dry and catch up with her Pleurx catheter drainages in hopes of obliterating the space.  Right now there is no plan for any surgical intervention.  We will continue to follow.      Lajuana Matte 04/21/2022 1:08 PM

## 2022-04-21 NOTE — Telephone Encounter (Signed)
Returned pt call. States she wanted to make sure Dr. Henrene Pastor is aware of her admission. Advised Dr. Carlean Purl has documented POT from GI perspective and GI providers will ensure she receives the care needed from a GI perspective. Verbalized acceptance and understanding.

## 2022-04-21 NOTE — Progress Notes (Signed)
PROGRESS NOTE    Theresa Rocha  IDP:[id number] DOB: 06/07/1966 DOA: 04/14/2022 PCP: Seward Carol, MD   Brief Narrative:  Theresa Rocha is a 56 y.o. female with medical history significant for cancer of the right breast, hepatocellular carcinoma, cirrhosis of liver, and recurrent left pleural effusion with PleurX catheter presented to hospital with worsening hyponatremia.  Patient has been holding her diuretics since 04/08/2022 and labs were performed as outpatient and was noted to have sodium level of 119.  She was advised to seek further evaluation in the ED. in the ED patient was also noted to be hyperkalemic.  After hospitalization patient was given midodrine and albumin but hospitalization was complicated with recurrent pleural effusion volume overload possible pneumonia.  He was started on cefepime for pneumonia with fevers on 522.  Plan is to get evaluation by CT surgery for recurrent left pleural effusion.     Assessment & Plan:   Principal Problem:   Acute on chronic respiratory failure with hypoxia (HCC) Active Problems:   Hyponatremia   SIRS (systemic inflammatory response syndrome) (HCC)   Hyperkalemia   Decompensated HCV cirrhosis (HCC)   Pleural effusion on left   Hypotension   Hepatocellular carcinoma (HCC)   Thrombocytopenia (HCC)   Anemia   Breast cancer (HCC)   Protein-calorie malnutrition, severe (HCC)  * Acute on chronic respiratory failure with hypoxia (HCC) Currently on high flow nasal cannula at 5 L/min.  Chest x-ray with pneumonia effusion.  Continue Pleurx catheter drainage.  Pleural fluid without any growth.  Cytology pending.  Patient had fever 04/19/2022.  Currently on Lasix 40 mg twice daily.    Pleural effusion on left On Pleurx at home.  Continue Pleurx catheter drainage daily.  CT surgery Dr. Lequita Halt the patient and the patient has been contacted to Pleur-evac.  Pleural fluid transudative without growth.  Cytology pending.  Decompensated HCV cirrhosis  (HCC) Elevated hepatitis C and alcohol use disorder.  History of esophageal varices portal gastropathy.  Has multiple hepatocellular cancer status post microwave ablation 08/2021, s/p yttium-90 radioembolization to R lobe of liver on 1/24.  Follows up with Roosevelt Locks NP at [facility] seen by GI during hospitalization as well.  Hyponatremia Improved at this time.  Latest sodium of 134.  Receiving Lasix.  Nephrology was consulted and recommended midodrine and albumin with a goal of MAP more than 80.  ACTH stimulation test normal.  TSH was normal.  BNP elevated at 858.  Hyperkalemia Received Lokelma.  Potassium has improved to 3.9.  Hypomagnesemia.  Mild.  Will replace.  Levels in AM.  SIRS (systemic inflammatory response syndrome)  Patient had fever on 04/20/2022 with tachypnea hypoxia tachycardia.  Patient was on Unasyn between 04/16/2022 to 04/19/2022.  Imaging on 04/20/2022 showed diffuse right lung opacities right effusion stable left effusion..  Negative for COVID and influenza.  Respiratory viral panel was negative as well.  MRSA PCR negative.  Blood cultures negative from 04/19/2022 so far.  Repeat thoracocentesis from 04/19/2022 shows transudative effusion. Fluid Gram stain was negative. Has been on unasyn from 5/19 -> 5/22 but at this time due to fever ,has been transitioned to cefepime.  Temperature max of 98.5 F.  Latest WBC at 8.5.  Hypotension Status post albumin.  Thought to be secondary to cirrhosis of liver.  Watch while on diuretics.  Continue midodrine.  Anemia of chronic disease. Hemoglobin of 9.3 at this time.  Continue to monitor.  Thrombocytopenia ([place]) Will continue to monitor.  Clumps are noted from today's exam.  History of breast cancer ([place]) Follows with [name] oncology as outpatient.  Oncology following while in the hospital.  Protein-calorie malnutrition, severe ([place]) Body mass index is 16.4 kg/m.  Present on admission.  Continue  nutritional supplements.  DVT prophylaxis: lovenox subcu  Code Status: full code  Family Communication:  Communicated with the patient at bedside  Disposition:   Status is: inpatient The patient is inpatient because: CT surgery evaluation, IV antibiotic, pending clinical improvement   Consultants:  GI Renal Oncology CT surgery  Procedures:  Pleurx catheter drainage.  Antimicrobials:  Cefepime 04/19/22>   Subjective: Today, patient was seen and examined at bedside.  Patient complains of shortness of breath and dyspnea with occasional chest pain.  Denies obvious cough.  No fever  Objective: Vitals:   04/21/22 0412 04/21/22 0430 04/21/22 0500 04/21/22 0600  BP:      Pulse:      Resp:   20 20  Temp: 98.5 F (36.9 C)     TempSrc: Oral     SpO2:      Weight:  40.9 kg    Height:        Intake/Output Summary (Last 24 hours) at 04/21/2022 0820 Last data filed at 04/21/2022 0650 Gross per 24 hour  Intake 370 ml  Output 1500 ml  Net -1130 ml   Filed Weights   04/20/22 0603 04/20/22 2116 04/21/22 0430  Weight: 42 kg 40.9 kg 40.9 kg  Body mass index is 15.97 kg/m.  General: Cachectic built, not in obvious distress, on high flow nasal cannula 5 L/min HENT:   No scleral pallor or icterus noted. Oral mucosa is moist.  Chest: Diminished breath sounds on the left, left Pleurx catheter in place.  Coarse breath sounds noted. CVS: S1 &S2 heard. No murmur.  Regular rate and rhythm. Abdomen: Soft, nontender, nondistended.  Bowel sounds are heard.   Extremities: No cyanosis, clubbing or edema.  Peripheral pulses are palpable.  Thin extremities Psych: Alert, awake and oriented, normal mood CNS:  No cranial nerve deficits.  Power equal in all extremities.   Skin: Warm and dry.  No rashes noted.  Data Reviewed: I have personally reviewed following labs and imaging studies  CBC: Recent Labs  Lab 04/16/22 0457 04/18/22 0442 04/19/22 0507 04/20/22 0347 04/21/22 0430  WBC  11.5* 10.8* 9.1 9.0 8.5  NEUTROABS 9.8* 9.2* 7.3 7.2 6.5  HGB 9.5* 9.0* 9.0* 9.4* 9.3*  HCT 30.7* 29.1* 28.3* 30.4* 28.9*  MCV 80.4 79.1* 79.5* 79.4* 77.5*  PLT 78* 79* 79* 80* PLATELET CLUMPS NOTED ON SMEAR, UNABLE TO ESTIMATE    Basic Metabolic Panel: Recent Labs  Lab 04/16/22 0457 04/16/22 0952 04/17/22 0520 04/18/22 0442 04/19/22 0507 04/20/22 0347 04/21/22 0430  NA 123*   < > 130* 133* 136 136 134*  K 4.6   < > 3.8 3.4* 3.7 4.7 3.9  CL 99   < > 103 103 106 108 102  CO2 18*   < > 19* 20* '22 23 23  '$ GLUCOSE 91   < > 79 100* 127* 101* 96  BUN 11   < > '11 16 16 20 20  '$ CREATININE 0.68   < > 0.59 0.60 0.72 0.70 0.63  CALCIUM 7.3*   < > 8.2* 8.2* 8.0* 7.9* 8.1*  MG 1.7  --   --  1.8 1.9 1.8 1.5*  PHOS 2.6  --  2.5 2.5 2.6 2.8 3.6   < > =  values in this interval not displayed.    GFR: Estimated Creatinine Clearance: 51.3 mL/min (by C-G formula based on SCr of 0.63 mg/dL).  Liver Function Tests: Recent Labs  Lab 04/16/22 0457 04/17/22 0520 04/18/22 0442 04/19/22 0507 04/20/22 0347 04/21/22 0430  AST 39 31  --  33 32 37  ALT 20 18  --  '15 16 18  '$ ALKPHOS 58 48  --  56 54 60  BILITOT 2.1* 2.5*  --  2.2* 2.7* 2.8*  PROT 5.2* 5.4*  --  5.6* 5.2* 4.9*  ALBUMIN 3.1* 3.4* 4.2 3.5 3.1* 2.7*    CBG: No results for input(s): GLUCAP in the last 168 hours.   Recent Results (from the past 240 hour(s))  Body fluid culture w Gram Stain     Status: None   Collection Time: 04/16/22  8:38 AM   Specimen: Pleura; Body Fluid  Result Value Ref Range Status   Specimen Description   Final    PLEURAL LT Performed at [facility] [address], [place], [place] [address]    Special Requests   Final    Normal Performed at [facility], [place] [address], [place], [place] [address]    Gram Stain   Final    WBC PRESENT, PREDOMINANTLY PMN NO ORGANISMS SEEN CYTOSPIN SMEAR    Culture   Final    NO GROWTH 3 DAYS Performed at [facility], [place] [address], [place], [place] [address]    Report Status 04/19/2022 FINAL  Final  Resp Panel by RT-PCR (Flu A&B, Covid) Nasopharyngeal Swab     Status: None   Collection Time: 04/18/22  9:52 AM   Specimen: Nasopharyngeal Swab; Nasopharyngeal(NP) swabs in vial transport medium  Result Value Ref Range Status   SARS Coronavirus 2 by RT PCR NEGATIVE NEGATIVE Final    Comment: (NOTE) SARS-CoV-2 target nucleic acids are NOT DETECTED.  The SARS-CoV-2 RNA is generally detectable in upper respiratory specimens during the acute phase of infection. The lowest concentration of SARS-CoV-2 viral copies this assay can detect is 138 copies/mL. A negative result does not preclude SARS-Cov-2 infection and should not be used as the sole basis for treatment or other patient management decisions. A negative result may occur with  improper specimen collection/handling, submission of specimen other than nasopharyngeal swab, presence of viral mutation(s) within the areas targeted by this assay, and inadequate number of viral copies(<138 copies/mL). A negative result must be combined with clinical observations, patient history, and epidemiological information. The expected result is Negative.  Fact Sheet for Patients:  EntrepreneurPulse.com.au  Fact Sheet for Healthcare Providers:  IncredibleEmployment.be  This test is no t yet approved or cleared by the [place] FDA and  has been authorized for detection and/or diagnosis of SARS-CoV-2 by FDA under an Emergency Use Authorization (EUA). This EUA will remain  in effect (meaning this test can be used) for the duration of the COVID-19 declaration under Section 564(b)(1) of the Act, 21 U.S.C.section 360bbb-3(b)(1), unless the authorization is terminated  or revoked sooner.       Influenza A by PCR NEGATIVE NEGATIVE Final   Influenza B by PCR NEGATIVE NEGATIVE Final    Comment: (NOTE) The Xpert Xpress  SARS-CoV-2/FLU/RSV plus assay is intended as an aid in the diagnosis of influenza from Nasopharyngeal swab specimens and should not be used as a sole basis for treatment. Nasal washings and aspirates are unacceptable for Xpert Xpress SARS-CoV-2/FLU/RSV testing.  Fact Sheet for Patients:  EntrepreneurPulse.com.au  Fact Sheet for Healthcare Providers: IncredibleEmployment.be  This test is not yet approved or cleared by the [place] FDA and has been authorized for detection and/or diagnosis of SARS-CoV-2 by FDA under an Emergency Use Authorization (EUA). This EUA will remain in effect (meaning this test can be used) for the duration of the COVID-19 declaration under Section 564(b)(1) of the Act, 21 U.S.C. section 360bbb-3(b)(1), unless the authorization is terminated or revoked.  Performed at [facility], [place] [address] [place], [place] [address]   MRSA Next Gen by PCR, Nasal     Status: None   Collection Time: 04/19/22  2:23 PM   Specimen: Nasal Mucosa; Nasal Swab  Result Value Ref Range Status   MRSA by PCR Next Gen NOT DETECTED NOT DETECTED Final    Comment: (NOTE) The GeneXpert MRSA Assay (FDA approved for NASAL specimens only), is one component of a comprehensive MRSA colonization surveillance program. It is not intended to diagnose MRSA infection nor to guide or monitor treatment for MRSA infections. Test performance is not FDA approved in patients less than 63 years old. Performed at [facility], [place] [address], [place], [place] [address]   Gram stain     Status: None   Collection Time: 04/19/22  2:23 PM   Specimen: Pleura  Result Value Ref Range Status   Specimen Description PLEURAL  Final   Special Requests NONE  Final   Gram Stain   Final    WBC PRESENT,BOTH PMN AND MONONUCLEAR NO ORGANISMS SEEN CYTOSPIN SMEAR Performed at [facility], [address] [place], [place]  [address]    Report Status 04/20/2022 FINAL  Final  Respiratory (~20 pathogens) panel by PCR     Status: None   Collection Time: 04/19/22  2:59 PM  Result Value Ref Range Status   Adenovirus NOT DETECTED NOT DETECTED Final   Coronavirus 229E NOT DETECTED NOT DETECTED Final    Comment: (NOTE) The Coronavirus on the Respiratory Panel, DOES NOT test for the novel  Coronavirus (2019 nCoV)    Coronavirus HKU1 NOT DETECTED NOT DETECTED Final   Coronavirus NL63 NOT DETECTED NOT DETECTED Final   Coronavirus OC43 NOT DETECTED NOT DETECTED Final   Metapneumovirus NOT DETECTED NOT DETECTED Final   Rhinovirus / Enterovirus NOT DETECTED NOT DETECTED Final   Influenza A NOT DETECTED NOT DETECTED Final   Influenza B NOT DETECTED NOT DETECTED Final   Parainfluenza Virus 1 NOT DETECTED NOT DETECTED Final   Parainfluenza Virus 2 NOT DETECTED NOT DETECTED Final   Parainfluenza Virus 3 NOT DETECTED NOT DETECTED Final   Parainfluenza Virus 4 NOT DETECTED NOT DETECTED Final   Respiratory Syncytial Virus NOT DETECTED NOT DETECTED Final   Bordetella pertussis NOT DETECTED NOT DETECTED Final   Bordetella Parapertussis NOT DETECTED NOT DETECTED Final   Chlamydophila pneumoniae NOT DETECTED NOT DETECTED Final   Mycoplasma pneumoniae NOT DETECTED NOT DETECTED Final    Comment: Performed at [facility], [address], [place], [place] [address]  Culture, blood (Routine X 2) w Reflex to ID Panel     Status: None (Preliminary result)   Collection Time: 04/19/22  3:35 PM   Specimen: BLOOD  Result Value Ref Range Status   Specimen Description   Final    BLOOD BLOOD LEFT FOREARM Performed at [facility] [address], [place], [place] [address]    Special Requests   Final    BOTTLES DRAWN AEROBIC  AND ANAEROBIC Blood Culture adequate volume Performed at [facility] [address], [place], [place] [address]    Culture   Final    NO GROWTH < 24 HOURS Performed at  [facility] [address] [place], [place] [address]    Report Status PENDING  Incomplete  Culture, blood (Routine X 2) w Reflex to ID Panel     Status: None (Preliminary result)   Collection Time: 04/19/22  3:35 PM   Specimen: BLOOD  Result Value Ref Range Status   Specimen Description   Final    BLOOD LEFT ANTECUBITAL Performed at [facility] [address], [place], [place] [address]    Special Requests   Final    BOTTLES DRAWN AEROBIC AND ANAEROBIC Blood Culture results may not be optimal due to an inadequate volume of blood received in culture bottles Performed at [facility] [address] [place], [place] [address]    Culture   Final    NO GROWTH < 24 HOURS Performed at [facility] [address] [place], [place] [address]    Report Status PENDING  Incomplete      Radiology Studies: No results found.    Scheduled Meds:  Chlorhexidine Gluconate Cloth  6 each Topical Daily   enoxaparin (LOVENOX) injection  30 mg Subcutaneous Q24H   feeding supplement (KATE FARMS STANDARD 1.4)  325 mL Oral TID BM   furosemide  40 mg Intravenous BID   midodrine  12.5 mg Oral TID WC   multivitamin with minerals  1 tablet Oral Daily   nicotine  21 mg Transdermal Daily   pantoprazole  40 mg Oral Daily   sodium chloride flush  10-40 mL Intracatheter Q12H   sodium chloride flush  3 mL Intravenous Q12H   tamoxifen  20 mg Oral Daily   Continuous Infusions:  ceFEPime (MAXIPIME) IV 2 g (04/21/22 0427)     LOS: 6 days    Flora Lipps, MD [facility] 04/21/2022, 8:20 AM

## 2022-04-21 NOTE — Progress Notes (Signed)
Nutrition Follow-up / Consult  DOCUMENTATION CODES:   Severe malnutrition in context of chronic illness, Underweight  INTERVENTION:   Liberalize diet to regular to increase food options for improved intake.  Change Dillard Essex standard 1.4 to once daily at bedtime.   Continue MVI with minerals daily.  NUTRITION DIAGNOSIS:   Severe Malnutrition related to chronic illness (cancer, cirrhosis) as evidenced by severe muscle depletion, severe fat depletion.  Ongoing   GOAL:   Patient will meet greater than or equal to 90% of their needs  Unmet  MONITOR:   PO intake, Supplement acceptance  REASON FOR ASSESSMENT:   Consult Assessment of nutrition requirement/status  ASSESSMENT:   Pt with medical history significant for cancer of the right breast, hepatocellular carcinoma, cirrhosis, and recurrent left pleural effusion with PleurX catheter for evaluation of worsening hyponatremia.  Patient reports poor intake r/t poor appetite. She has ulcers on her gums, which are making it painful to eat and drink. She can tolerate the [employer] supplements, but it fills her up, so she has not been drinking it. She would rather eat food than drink the supplements. She agreed to try drinking the supplement in the evenings. She does not like the way the food tastes; it is very bland. She is on a low sodium diet and states that the food has no flavor. She uses No Salt substitute at home, but hasn't thought to ask her husband to bring it in. Will liberalize diet to regular, in hopes of increasing oral intake. Doubt patient will exceed her sodium limits on a regular diet, given poor appetite and intake.   Labs reviewed. Na 134, Mag 1.5  Medications reviewed and include Lasix, Mag-Ox, MVI with minerals, Protonix.   NUTRITION - FOCUSED PHYSICAL EXAM:  Flowsheet Row Most Recent Value  Orbital Region Severe depletion  Upper Arm Region Severe depletion  Thoracic and Lumbar Region Severe depletion   Buccal Region Severe depletion  Temple Region Severe depletion  Clavicle Bone Region Severe depletion  Clavicle and Acromion Bone Region Severe depletion  Scapular Bone Region Severe depletion  Dorsal Hand Severe depletion  Patellar Region Severe depletion  Anterior Thigh Region Severe depletion  Posterior Calf Region Severe depletion  Edema (RD Assessment) None  Hair Reviewed  Eyes Reviewed  Mouth Reviewed  [mouth ulcers]  Skin Reviewed  Nails Reviewed       Diet Order:   Diet Order             Diet regular Room service appropriate? Yes; Fluid consistency: Thin  Diet effective now                   EDUCATION NEEDS:   Education needs have been addressed  Skin:  Skin Assessment: Reviewed RN Assessment  Last BM:  5/22  Height:   Ht Readings from Last 1 Encounters:  04/20/22 '5\' 3"'$  (1.6 m)    Weight:   Wt Readings from Last 1 Encounters:  04/21/22 40.9 kg    Ideal Body Weight:  52.3 kg  BMI:  Body mass index is 15.97 kg/m.  Estimated Nutritional Needs:   Kcal:  1800-2000  Protein:  105-120 grams  Fluid:  > 1.8 L    Lucas Mallow RD, LDN, CNSC Please refer to Amion for contact information.

## 2022-04-22 ENCOUNTER — Inpatient Hospital Stay (HOSPITAL_COMMUNITY): Payer: BC Managed Care – PPO

## 2022-04-22 DIAGNOSIS — J9621 Acute and chronic respiratory failure with hypoxia: Secondary | ICD-10-CM | POA: Diagnosis not present

## 2022-04-22 LAB — CBC
HCT: 28.3 % — ABNORMAL LOW (ref 36.0–46.0)
Hemoglobin: 9.2 g/dL — ABNORMAL LOW (ref 12.0–15.0)
MCH: 24.8 pg — ABNORMAL LOW (ref 26.0–34.0)
MCHC: 32.5 g/dL (ref 30.0–36.0)
MCV: 76.3 fL — ABNORMAL LOW (ref 80.0–100.0)
Platelets: 84 10*3/uL — ABNORMAL LOW (ref 150–400)
RBC: 3.71 MIL/uL — ABNORMAL LOW (ref 3.87–5.11)
RDW: 21.2 % — ABNORMAL HIGH (ref 11.5–15.5)
WBC: 6.4 10*3/uL (ref 4.0–10.5)
nRBC: 0 % (ref 0.0–0.2)

## 2022-04-22 LAB — COMPREHENSIVE METABOLIC PANEL
ALT: 18 U/L (ref 0–44)
AST: 43 U/L — ABNORMAL HIGH (ref 15–41)
Albumin: 2.6 g/dL — ABNORMAL LOW (ref 3.5–5.0)
Alkaline Phosphatase: 71 U/L (ref 38–126)
Anion gap: 6 (ref 5–15)
BUN: 19 mg/dL (ref 6–20)
CO2: 26 mmol/L (ref 22–32)
Calcium: 8 mg/dL — ABNORMAL LOW (ref 8.9–10.3)
Chloride: 96 mmol/L — ABNORMAL LOW (ref 98–111)
Creatinine, Ser: 0.7 mg/dL (ref 0.44–1.00)
GFR, Estimated: >60 mL/min (ref >60)
Glucose, Bld: 111 mg/dL — ABNORMAL HIGH (ref 70–99)
Potassium: 3.4 mmol/L — ABNORMAL LOW (ref 3.5–5.1)
Sodium: 128 mmol/L — ABNORMAL LOW (ref 135–145)
Total Bilirubin: 2.6 mg/dL — ABNORMAL HIGH (ref 0.3–1.2)
Total Protein: 5 g/dL — ABNORMAL LOW (ref 6.5–8.1)

## 2022-04-22 LAB — MAGNESIUM: Magnesium: 1.6 mg/dL — ABNORMAL LOW (ref 1.7–2.4)

## 2022-04-22 MED ORDER — HYDROXYZINE HCL 10 MG PO TABS: 10.0000 mg | ORAL_TABLET | Freq: Three times a day (TID) | ORAL | Status: DC | PRN

## 2022-04-22 MED ORDER — LIDOCAINE 5 % EX PTCH
1.0000 | MEDICATED_PATCH | CUTANEOUS | Status: DC
  Administered 2022-04-22 – 2022-05-03 (×12): 1 via TRANSDERMAL
  Filled 2022-04-22 (×12): qty 1

## 2022-04-22 MED ORDER — MAGNESIUM SULFATE 2 GM/50ML IV SOLN
2.0000 g | Freq: Once | INTRAVENOUS | Status: AC
  Administered 2022-04-22: 2 g via INTRAVENOUS
  Filled 2022-04-22: qty 50

## 2022-04-22 MED ORDER — PROCHLORPERAZINE EDISYLATE 10 MG/2ML IJ SOLN
5.0000 mg | Freq: Four times a day (QID) | INTRAMUSCULAR | Status: DC | PRN
  Administered 2022-04-22 – 2022-04-30 (×9): 5 mg via INTRAVENOUS
  Filled 2022-04-22 (×9): qty 2

## 2022-04-22 MED ORDER — MAGIC MOUTHWASH W/LIDOCAINE
5.0000 mL | Freq: Three times a day (TID) | ORAL | Status: DC | PRN
  Administered 2022-04-22 – 2022-04-25 (×2): 5 mL via ORAL
  Filled 2022-04-22 (×4): qty 5

## 2022-04-22 MED ORDER — ONDANSETRON HCL 4 MG/2ML IJ SOLN
4.0000 mg | Freq: Four times a day (QID) | INTRAMUSCULAR | Status: DC | PRN
  Administered 2022-04-22 – 2022-05-02 (×9): 4 mg via INTRAVENOUS
  Filled 2022-04-22 (×9): qty 2

## 2022-04-22 MED ORDER — FUROSEMIDE 10 MG/ML IJ SOLN
40.0000 mg | Freq: Every day | INTRAMUSCULAR | Status: DC
Start: 2022-04-23 — End: 2022-04-24
  Administered 2022-04-23 – 2022-04-24 (×2): 40 mg via INTRAVENOUS
  Filled 2022-04-22 (×2): qty 4

## 2022-04-22 MED ORDER — MIDODRINE HCL 5 MG PO TABS
15.0000 mg | ORAL_TABLET | Freq: Three times a day (TID) | ORAL | Status: DC
  Administered 2022-04-22 – 2022-05-03 (×33): 15 mg via ORAL
  Filled 2022-04-22 (×32): qty 3

## 2022-04-22 MED ORDER — POTASSIUM CHLORIDE CRYS ER 20 MEQ PO TBCR
40.0000 meq | EXTENDED_RELEASE_TABLET | Freq: Once | ORAL | Status: AC
  Administered 2022-04-22: 40 meq via ORAL
  Filled 2022-04-22: qty 2

## 2022-04-22 NOTE — Progress Notes (Signed)
PROGRESS NOTE    Theresa Rocha  NWG:[id number] DOB: 10-05-1966 DOA: 04/14/2022 PCP: Seward Carol, MD   Brief Narrative:  Theresa Rocha 56 y.o. female with history of right-sided breast cancer, hepatocellular carcinoma, cirrhosis, and recurrent left pleural effusions who was transferred from [facility] for management of her recurrent effusion. She states that in October she began experiencing some shortness of breath and was diagnosed with a left pleural effusion.  This came shortly after completion of her ablation therapy to her liver for her [place].  She has had multiple thoracentesis, and also had a Pleurx catheter placed.  She drained over a liter daily but continues to have significant shortness of breath.     Assessment & Plan:   Principal Problem:   Acute on chronic respiratory failure with hypoxia (HCC) Active Problems:   Hyponatremia   SIRS (systemic inflammatory response syndrome) (HCC)   Hyperkalemia   Decompensated HCV cirrhosis (HCC)   Pleural effusion on left   Hypotension   Hepatocellular carcinoma (HCC)   Thrombocytopenia (HCC)   Anemia   Breast cancer (HCC)   Protein-calorie malnutrition, severe (HCC)  Acute on chronic respiratory failure with hypoxia ([place]) -wean to room air as able  Pleural effusion on left On Pleurx at home.  Continue Pleurx catheter drainage daily.  CT surgery Dr. Lequita Halt the patient and the patient has been contacted to Pleur-evac.  Pleural fluid transudative without growth.  Cytology pending. -IV lasix  Decompensated HCV cirrhosis (HCC) Elevated hepatitis C and alcohol use disorder.  History of esophageal varices portal gastropathy.  Has multiple hepatocellular cancer status post microwave ablation 08/2021, s/p [id number] radioembolization to R lobe of liver on 1/24.  Follows up with Roosevelt Locks NP at [facility] seen by GI during hospitalization as well.  Hyponatremia Nephrology: last seen 5/21: Hyponatremia - in a pt  w/ cirrhosis and portal HTN. Treatment of hyponatremia in cirrhotics is unique in that the main goal is to improve blood pressures, which should then improve renal perfusion and therefore water clearance. NS IVF's dc'd and started on midodrine 12.5 tid and IV albumin. MAP has improved (goal > 82) and Na+ is now normal. Has completed albumin course. Would cont midodrine for now and can taper down the dose if needed to maintain MAP > 80 and Na > 128 if possible. Can keep her on the midodrine at dc as well if there is a PCP to follow her.  No other suggestions, will sign off.  ACTH stimulation test normal.  TSH was normal.  -correct initially but now back down -recheck in AM -add urine Na and fluid restrict for now  Hyperkalemia -replete  Hypomagnesemia.   -replete  Hypotension Status post albumin.  Thought to be secondary to cirrhosis of liver.   - Continue midodrine.  Anemia of chronic disease. Hemoglobin of 9.3 at this time.  Continue to monitor.  Thrombocytopenia ([place]) -daily labs  History of breast cancer ([place]) Follows with [name] oncology as outpatient.  Oncology following while in the hospital. -last seen 5/23:  plan to repeat her abdominal MRI after discharge for her [facility] follow up  Protein-calorie malnutrition, severe ([place]) Nutrition Status: Nutrition Problem: Severe Malnutrition Etiology: chronic illness (cancer, cirrhosis) Signs/Symptoms: severe muscle depletion, severe fat depletion Interventions: MVI, Other (Comment), Liberalize Diet    DVT prophylaxis: lovenox subcu  Code Status: full code    Disposition:   Status is: inpatient The patient is inpatient because: chest tube in place  Consultants:  GI Renal Oncology CT surgery  Procedures:  Pleurx catheter drainage.    Subjective: C/o sores in mouth  Objective: Vitals:   04/21/22 1933 04/21/22 2313 04/22/22 0349 04/22/22 0900  BP: 99/64 111/68 (!) 88/60 90/64  Pulse: 82 82 85 74  Resp: '19 13  17 16  '$ Temp: 98.2 F (36.8 C) 97.8 F (36.6 C) (!) 97.5 F (36.4 C) 98 F (36.7 C)  TempSrc: Oral Oral Oral Oral  SpO2: 99% 99% 97% 100%  Weight:   38.4 kg   Height:        Intake/Output Summary (Last 24 hours) at 04/22/2022 1214 Last data filed at 04/22/2022 0846 Gross per 24 hour  Intake 243 ml  Output 2950 ml  Net -2707 ml   Filed Weights   04/20/22 2116 04/21/22 0430 04/22/22 0349  Weight: 40.9 kg 40.9 kg 38.4 kg    General: Appearance:    Thin female in no acute distress     Lungs:     Chest tube in place, respirations unlabored  Heart:    Normal heart rate.   MS:   All extremities are intact.   Neurologic:   Awake, alert    Data Reviewed: I have personally reviewed following labs and imaging studies  CBC: Recent Labs  Lab 04/16/22 0457 04/18/22 0442 04/19/22 0507 04/20/22 0347 04/21/22 0430 04/22/22 0430  WBC 11.5* 10.8* 9.1 9.0 8.5 6.4  NEUTROABS 9.8* 9.2* 7.3 7.2 6.5  --   HGB 9.5* 9.0* 9.0* 9.4* 9.3* 9.2*  HCT 30.7* 29.1* 28.3* 30.4* 28.9* 28.3*  MCV 80.4 79.1* 79.5* 79.4* 77.5* 76.3*  PLT 78* 79* 79* 80* PLATELET CLUMPS NOTED ON SMEAR, UNABLE TO ESTIMATE 84*    Basic Metabolic Panel: Recent Labs  Lab 04/17/22 0520 04/18/22 0442 04/19/22 0507 04/20/22 0347 04/21/22 0430 04/22/22 0430  NA 130* 133* 136 136 134* 128*  K 3.8 3.4* 3.7 4.7 3.9 3.4*  CL 103 103 106 108 102 96*  CO2 19* 20* '22 23 23 26  '$ GLUCOSE 79 100* 127* 101* 96 111*  BUN '11 16 16 20 20 19  '$ CREATININE 0.59 0.60 0.72 0.70 0.63 0.70  CALCIUM 8.2* 8.2* 8.0* 7.9* 8.1* 8.0*  MG  --  1.8 1.9 1.8 1.5* 1.6*  PHOS 2.5 2.5 2.6 2.8 3.6  --     GFR: Estimated Creatinine Clearance: 48.2 mL/min (by C-G formula based on SCr of 0.7 mg/dL).  Liver Function Tests: Recent Labs  Lab 04/17/22 0520 04/18/22 0442 04/19/22 0507 04/20/22 0347 04/21/22 0430 04/22/22 0430  AST 31  --  33 32 37 43*  ALT 18  --  '15 16 18 18  '$ ALKPHOS 48  --  56 54 60 71  BILITOT 2.5*  --  2.2* 2.7* 2.8*  2.6*  PROT 5.4*  --  5.6* 5.2* 4.9* 5.0*  ALBUMIN 3.4* 4.2 3.5 3.1* 2.7* 2.6*    CBG: No results for input(s): GLUCAP in the last 168 hours.   Recent Results (from the past 240 hour(s))  Body fluid culture w Gram Stain     Status: None   Collection Time: 04/16/22  8:38 AM   Specimen: Pleura; Body Fluid  Result Value Ref Range Status   Specimen Description   Final    PLEURAL LT Performed at [facility] [address], [place], [place] [address]    Special Requests   Final    Normal Performed at [facility], [address] [place],  [place] [address]    Gram Stain   Final    WBC PRESENT, PREDOMINANTLY PMN NO ORGANISMS SEEN CYTOSPIN SMEAR    Culture   Final    NO GROWTH 3 DAYS Performed at [facility] [address], [place], [place] [address]    Report Status 04/19/2022 FINAL  Final  Resp Panel by RT-PCR (Flu A&B, Covid) Nasopharyngeal Swab     Status: None   Collection Time: 04/18/22  9:52 AM   Specimen: Nasopharyngeal Swab; Nasopharyngeal(NP) swabs in vial transport medium  Result Value Ref Range Status   SARS Coronavirus 2 by RT PCR NEGATIVE NEGATIVE Final    Comment: (NOTE) SARS-CoV-2 target nucleic acids are NOT DETECTED.  The SARS-CoV-2 RNA is generally detectable in upper respiratory specimens during the acute phase of infection. The lowest concentration of SARS-CoV-2 viral copies this assay can detect is 138 copies/mL. A negative result does not preclude SARS-Cov-2 infection and should not be used as the sole basis for treatment or other patient management decisions. A negative result may occur with  improper specimen collection/handling, submission of specimen other than nasopharyngeal swab, presence of viral mutation(s) within the areas targeted by this assay, and inadequate number of viral copies(<138 copies/mL). A negative result must be combined with clinical observations, patient history, and  epidemiological information. The expected result is Negative.  Fact Sheet for Patients:  EntrepreneurPulse.com.au  Fact Sheet for Healthcare Providers:  IncredibleEmployment.be  This test is no t yet approved or cleared by the [place] FDA and  has been authorized for detection and/or diagnosis of SARS-CoV-2 by FDA under an Emergency Use Authorization (EUA). This EUA will remain  in effect (meaning this test can be used) for the duration of the COVID-19 declaration under Section 564(b)(1) of the Act, 21 U.S.C.section 360bbb-3(b)(1), unless the authorization is terminated  or revoked sooner.       Influenza A by PCR NEGATIVE NEGATIVE Final   Influenza B by PCR NEGATIVE NEGATIVE Final    Comment: (NOTE) The Xpert Xpress SARS-CoV-2/FLU/RSV plus assay is intended as an aid in the diagnosis of influenza from Nasopharyngeal swab specimens and should not be used as a sole basis for treatment. Nasal washings and aspirates are unacceptable for Xpert Xpress SARS-CoV-2/FLU/RSV testing.  Fact Sheet for Patients: EntrepreneurPulse.com.au  Fact Sheet for Healthcare Providers: IncredibleEmployment.be  This test is not yet approved or cleared by the [place] FDA and has been authorized for detection and/or diagnosis of SARS-CoV-2 by FDA under an Emergency Use Authorization (EUA). This EUA will remain in effect (meaning this test can be used) for the duration of the COVID-19 declaration under Section 564(b)(1) of the Act, 21 U.S.C. section 360bbb-3(b)(1), unless the authorization is terminated or revoked.  Performed at [facility], [place] [address], [place], [place] [address]   MRSA Next Gen by PCR, Nasal     Status: None   Collection Time: 04/19/22  2:23 PM   Specimen: Nasal Mucosa; Nasal Swab  Result Value Ref Range Status   MRSA by PCR Next Gen NOT DETECTED NOT DETECTED Final     Comment: (NOTE) The GeneXpert MRSA Assay (FDA approved for NASAL specimens only), is one component of a comprehensive MRSA colonization surveillance program. It is not intended to diagnose MRSA infection nor to guide or monitor treatment for MRSA infections. Test performance is not FDA approved in patients less than 27 years old. Performed at [facility], [place] [address], [place],  [address]  Culture, body fluid w Gram Stain-bottle     Status: None (Preliminary result)   Collection Time: 04/19/22  2:23 PM   Specimen: Pleura  Result Value Ref Range Status   Specimen Description PLEURAL  Final   Special Requests NONE  Final   Culture   Final    NO GROWTH 3 DAYS Performed at [facility], [address], [place], [place] [address]    Report Status PENDING  Incomplete  Gram stain     Status: None   Collection Time: 04/19/22  2:23 PM   Specimen: Pleura  Result Value Ref Range Status   Specimen Description PLEURAL  Final   Special Requests NONE  Final   Gram Stain   Final    WBC PRESENT,BOTH PMN AND MONONUCLEAR NO ORGANISMS SEEN CYTOSPIN SMEAR Performed at [facility], [address] [place], [place] [address]    Report Status 04/20/2022 FINAL  Final  Respiratory (~20 pathogens) panel by PCR     Status: None   Collection Time: 04/19/22  2:59 PM  Result Value Ref Range Status   Adenovirus NOT DETECTED NOT DETECTED Final   Coronavirus 229E NOT DETECTED NOT DETECTED Final    Comment: (NOTE) The Coronavirus on the Respiratory Panel, DOES NOT test for the novel  Coronavirus (2019 nCoV)    Coronavirus HKU1 NOT DETECTED NOT DETECTED Final   Coronavirus NL63 NOT DETECTED NOT DETECTED Final   Coronavirus OC43 NOT DETECTED NOT DETECTED Final   Metapneumovirus NOT DETECTED NOT DETECTED Final   Rhinovirus / Enterovirus NOT DETECTED NOT DETECTED Final   Influenza A NOT DETECTED NOT DETECTED Final   Influenza B NOT DETECTED NOT DETECTED Final    Parainfluenza Virus 1 NOT DETECTED NOT DETECTED Final   Parainfluenza Virus 2 NOT DETECTED NOT DETECTED Final   Parainfluenza Virus 3 NOT DETECTED NOT DETECTED Final   Parainfluenza Virus 4 NOT DETECTED NOT DETECTED Final   Respiratory Syncytial Virus NOT DETECTED NOT DETECTED Final   Bordetella pertussis NOT DETECTED NOT DETECTED Final   Bordetella Parapertussis NOT DETECTED NOT DETECTED Final   Chlamydophila pneumoniae NOT DETECTED NOT DETECTED Final   Mycoplasma pneumoniae NOT DETECTED NOT DETECTED Final    Comment: Performed at [facility], [address] [place], [place] [address]  Culture, blood (Routine X 2) w Reflex to ID Panel     Status: None (Preliminary result)   Collection Time: 04/19/22  3:35 PM   Specimen: BLOOD  Result Value Ref Range Status   Specimen Description   Final    BLOOD BLOOD LEFT FOREARM Performed at [facility] [address] [place], [place] [address]    Special Requests   Final    BOTTLES DRAWN AEROBIC AND ANAEROBIC Blood Culture adequate volume Performed at [facility] [address], [place], [place] [address]    Culture   Final    NO GROWTH 3 DAYS Performed at [facility], [place] [address] [place], [place] [address]    Report Status PENDING  Incomplete  Culture, blood (Routine X 2) w Reflex to ID Panel     Status: None (Preliminary result)   Collection Time: 04/19/22  3:35 PM   Specimen: BLOOD  Result Value Ref Range Status   Specimen Description   Final    BLOOD LEFT ANTECUBITAL Performed at [facility] [address] [place], [place] [address]    Special Requests   Final  BOTTLES DRAWN AEROBIC AND ANAEROBIC Blood Culture results may not be optimal due to an inadequate volume of blood received in culture bottles Performed at [facility], [place] [address] [place], [place] [address]    Culture   Final    NO GROWTH 3 DAYS Performed at [facility], [place] [address], [place], [place] [address]    Report Status PENDING  Incomplete      Radiology Studies: DG Chest Port 1 View  Result Date: 04/22/2022 CLINICAL DATA:  Chest tube EXAM: PORTABLE CHEST 1 VIEW COMPARISON:  Chest x-ray dated Apr 19, 2022 FINDINGS: Unchanged position of right arm PICC. Trace left pleural effusion, decreased in size when compared to prior exam. Left-sided chest tube in place. Trace right pleural effusion. No evidence of pneumothorax. Improved aeration of the right lung. visible nodular opacity of the lower right lung. IMPRESSION: 1. Trace left pleural effusion, decreased in size when compared to prior exam. Left-sided chest tube in place. 2. Improved aeration of the right lung. Newly visible nodular opacity of the lower right lung, possibly a nipple shadow. Recommend follow-up exam with nipple markers. Electronically Signed   By: Yetta Glassman M.D.   On: 04/22/2022 08:49      Scheduled Meds:  Chlorhexidine Gluconate Cloth  6 each Topical Daily   enoxaparin (LOVENOX) injection  30 mg Subcutaneous Q24H   feeding supplement (KATE FARMS STANDARD 1.4)  325 mL Oral Daily   [START ON 04/23/2022] furosemide  40 mg Intravenous Daily   lidocaine  1 patch Transdermal Q24H   magnesium oxide  400 mg Oral BID   midodrine  15 mg Oral TID WC   multivitamin with minerals  1 tablet Oral Daily   nicotine  21 mg Transdermal Daily   pantoprazole  40 mg Oral Daily   sodium chloride flush  10-40 mL Intracatheter Q12H   sodium chloride flush  3 mL Intravenous Q12H   tamoxifen  20 mg Oral Daily   Continuous Infusions:  ceFEPime (MAXIPIME) IV 2 g (04/22/22 0421)     LOS: 7 days    Geradine Girt, DO [facility] 04/22/2022, 12:14 PM

## 2022-04-22 NOTE — Progress Notes (Signed)
Pharmacy Antibiotic Note  Theresa Rocha is a 56 y.o. female with [place], breast cancer, cirrhosis, and recurrent pleural effusion (s/p Pleurx catheter placement) who presented to the ED on 04/14/2022 after she was found to have low sodium with labs collected at [facility].  CXR on 04/16/22 showed enlarging left pleural effusion and  increased patchy consolidation right lower lung field with concern for pneumonia. Pt continues on Cefepime for pleural effusion.  Noted pleurx catheter is connected to pleur-evac - drained 2949m/24h.   Plan: Continue Cefepime 2gm IV q12h Will f/u renal function, micro data, and pt's clinical condition F/u length of therapy  _______________________________ Height: '5\' 3"'$  (160 cm) Weight: 38.4 kg (84 lb 10.5 oz) IBW/kg (Calculated) : 52.4  Temp (24hrs), Avg:98.2 F (36.8 C), Min:97.5 F (36.4 C), Max:99 F (37.2 C)  Recent Labs  Lab 04/18/22 0442 04/19/22 0507 04/20/22 0347 04/21/22 0430 04/22/22 0430  WBC 10.8* 9.1 9.0 8.5 6.4  CREATININE 0.60 0.72 0.70 0.63 0.70     Estimated Creatinine Clearance: 48.2 mL/min (by C-G formula based on SCr of 0.7 mg/dL).    Allergies  Allergen Reactions   Hydromorphone Hcl Nausea And Vomiting    Severe vomiting   Aspirin Nausea Only    Antimicrobials this admission:  5/19 Unasyn >> 5/22 5/22 Vancomycin >> 5/23 5/22 Cefepime >>  Microbiology results:  5/19 Pleural fluid: NGF 5/22 BCx: NGTD 5/22 MRSA PCR:  neg  Thank you for allowing pharmacy to be a part of this patient's care.  CSherlon Handing PharmD, BCPS Please see amion for complete clinical pharmacist phone list 04/22/2022 8:54 AM

## 2022-04-22 NOTE — Progress Notes (Signed)
      [address]       [place], [address]             [phone]         Subjective: Some pain, feeling emotionally upset  Objective: Vital signs in last 24 hours: Temp:  [97.5 F (36.4 C)-99 F (37.2 C)] 97.5 F (36.4 C) (05/25 0349) Pulse Rate:  [82-88] 85 (05/25 0349) Cardiac Rhythm: Normal sinus rhythm (05/25 0340) Resp:  [13-19] 17 (05/25 0349) BP: (88-111)/(56-76) 88/60 (05/25 0349) SpO2:  [95 %-99 %] 97 % (05/25 0349) Weight:  [38.4 kg] 38.4 kg (05/25 0349)  Hemodynamic parameters for last 24 hours:    Intake/Output from previous day: 05/24 0701 - 05/25 0700 In: 243 [P.O.:240; I.V.:3] Out: 2950 [Drains:2950] Intake/Output this shift: No intake/output data recorded.  General appearance: alert, cooperative, fatigued, and no distress Heart: regular rate and rhythm Lungs: dim on right  Lab Results: Recent Labs    04/21/22 0430 04/22/22 0430  WBC 8.5 6.4  HGB 9.3* 9.2*  HCT 28.9* 28.3*  PLT PLATELET CLUMPS NOTED ON SMEAR, UNABLE TO ESTIMATE 84*   BMET:  Recent Labs    04/21/22 0430 04/22/22 0430  NA 134* 128*  K 3.9 3.4*  CL 102 96*  CO2 23 26  GLUCOSE 96 111*  BUN 20 19  CREATININE 0.63 0.70  CALCIUM 8.1* 8.0*    PT/INR: No results for input(s): LABPROT, INR in the last 72 hours. ABG No results found for: PHART, HCO3, TCO2, ACIDBASEDEF, O2SAT CBG (last 3)  No results for input(s): GLUCAP in the last 72 hours.  Meds Scheduled Meds:  Chlorhexidine Gluconate Cloth  6 each Topical Daily   enoxaparin (LOVENOX) injection  30 mg Subcutaneous Q24H   feeding supplement (KATE FARMS STANDARD 1.4)  325 mL Oral Daily   furosemide  40 mg Intravenous BID   magnesium oxide  400 mg Oral BID   midodrine  12.5 mg Oral TID WC   multivitamin with minerals  1 tablet Oral Daily   nicotine  21 mg Transdermal Daily   pantoprazole  40 mg Oral Daily   sodium chloride flush  10-40 mL Intracatheter Q12H   sodium chloride flush  3 mL  Intravenous Q12H   tamoxifen  20 mg Oral Daily   Continuous Infusions:  ceFEPime (MAXIPIME) IV 2 g (04/22/22 0421)   PRN Meds:.acetaminophen **OR** acetaminophen, albuterol, baclofen, dicyclomine, guaiFENesin-dextromethorphan, nicotine polacrilex, oxyCODONE, phenol, senna-docusate, sodium chloride flush  Xrays No results found.  Assessment/Plan:  1 Tmax 99, s BP 88-111 range 2 sats ok on 5 HFNC 3 2,950 from drain/24/h- follow closely 4 voiding well 5 no CXR today 6 medical management per primary- following with you   LOS: 7 days    John Giovanni PA-C Pager [phone] 04/22/2022

## 2022-04-23 ENCOUNTER — Encounter: Payer: BC Managed Care – PPO | Admitting: Thoracic Surgery (Cardiothoracic Vascular Surgery)

## 2022-04-23 DIAGNOSIS — J9621 Acute and chronic respiratory failure with hypoxia: Secondary | ICD-10-CM | POA: Diagnosis not present

## 2022-04-23 LAB — BASIC METABOLIC PANEL
Anion gap: 5 (ref 5–15)
BUN: 20 mg/dL (ref 6–20)
CO2: 27 mmol/L (ref 22–32)
Calcium: 8.1 mg/dL — ABNORMAL LOW (ref 8.9–10.3)
Chloride: 98 mmol/L (ref 98–111)
Creatinine, Ser: 0.77 mg/dL (ref 0.44–1.00)
GFR, Estimated: >60 mL/min (ref >60)
Glucose, Bld: 102 mg/dL — ABNORMAL HIGH (ref 70–99)
Potassium: 4.2 mmol/L (ref 3.5–5.1)
Sodium: 130 mmol/L — ABNORMAL LOW (ref 135–145)

## 2022-04-23 LAB — SODIUM, URINE, RANDOM: Sodium, Ur: <10 mmol/L

## 2022-04-23 NOTE — Progress Notes (Signed)
  Transition of Care [facility]) Screening Note   Patient Details  Name: Theresa Rocha Date of Birth: [birth year]   Transition of Care [facility]) CM/SW Contact:    Dawayne Patricia, RN Phone Number: 04/23/2022, 2:09 PM    Transition of Care Department [facility]) has reviewed patient and no TOC needs have been identified at this time.Note patient had pleurX cath prior to admit.  We will continue to monitor patient advancement through interdisciplinary progression rounds. If new patient transition needs arise, please place a TOC consult.

## 2022-04-23 NOTE — Progress Notes (Addendum)
PROGRESS NOTE    Theresa Rocha  WNI:[id number] DOB: [birth year] DOA: 04/14/2022 PCP: Seward Carol, MD   Brief Narrative:  Theresa Rocha 56 y.o. female with history of right-sided breast cancer, hepatocellular carcinoma, cirrhosis, and recurrent left pleural effusions who was transferred from [facility] for management of her recurrent effusion. She states that in October she began experiencing some shortness of breath and was diagnosed with a left pleural effusion.  This came shortly after completion of her ablation therapy to her liver for her [place].  She has had multiple thoracentesis, and also had a Pleurx catheter placed.  Na continues to be an issue.      Assessment & Plan:   Principal Problem:   Acute on chronic respiratory failure with hypoxia (HCC) Active Problems:   Hyponatremia   SIRS (systemic inflammatory response syndrome) (HCC)   Hyperkalemia   Decompensated HCV cirrhosis (HCC)   Pleural effusion on left   Hypotension   Hepatocellular carcinoma (HCC)   Thrombocytopenia (HCC)   Anemia   Breast cancer (HCC)   Protein-calorie malnutrition, severe (HCC)  Acute on chronic respiratory failure with hypoxia ([place]) -wean to room air as able -off O2 this AM   Pleural effusion on left On Pleurx at home.  Continue Pleurx catheter drainage daily.  CT surgery Dr. Lequita Halt the patient and the patient has been contacted to Pleur-evac.  Pleural fluid transudative without growth.  Cytology pending. -IV lasix- have decreased dose due to Na  Decompensated HCV cirrhosis (HCC) Elevated hepatitis C and alcohol use disorder.  History of esophageal varices portal gastropathy.  Has multiple hepatocellular cancer status post microwave ablation 08/2021, s/p [id number] radioembolization to R lobe of liver on 1/24.  Follows up with Roosevelt Locks NP at [place] [facility] seen by GI during hospitalization as well.  Hyponatremia Nephrology: last seen 5/21: Hyponatremia - in a pt w/  cirrhosis and portal HTN. Treatment of hyponatremia in cirrhotics is unique in that the main goal is to improve blood pressures, which should then improve renal perfusion and therefore water clearance. NS IVF's dc'd and started on midodrine 12.5 tid and IV albumin. MAP has improved (goal > 82) and Na+ is now normal. Has completed albumin course. Would cont midodrine for now and can taper down the dose if needed to maintain MAP > 80 and Na > 128 if possible. Can keep her on the midodrine at dc as well if there is a PCP to follow her.  No other suggestions, will sign off.  ACTH stimulation test normal.  TSH was normal.  -correct initially but now back down -lasix decreased due to urine Na < 10  Hypokalemia -repleted  Hypomagnesemia.   -repleted  Hypotension Status post albumin.  Thought to be secondary to cirrhosis of liver.   - Continue midodrine- increased dose  Anemia of chronic disease. Hemoglobin of 9.3 at this time.  Continue to monitor. -low Fe levels in past- check  Thrombocytopenia ([place]) -daily labs  History of breast cancer ([place]) Follows with [name] oncology as outpatient.  Oncology following while in the hospital. -last seen 5/23:  plan to repeat her abdominal MRI after discharge for her [facility] follow up  Protein-calorie malnutrition, severe ([place]) Nutrition Status: Nutrition Problem: Severe Malnutrition Etiology: chronic illness (cancer, cirrhosis) Signs/Symptoms: severe muscle depletion, severe fat depletion Interventions: MVI, Other (Comment), Liberalize Diet       DVT prophylaxis: lovenox subcu  Code Status: full code    Disposition:  Status is: inpatient The patient is inpatient because: chest tube in place   Consultants:  GI Renal Oncology CT surgery  Procedures:  Pleurx catheter drainage.    Subjective: Feeling better, no nausea  Objective: Vitals:   04/23/22 0008 04/23/22 0351 04/23/22 0428 04/23/22 0439  BP: (!) 93/53 (!) 83/65 (!)  86/61 91/69  Pulse: 70 70 71 79  Resp: '17 11 13 19  '$ Temp:  (!) 97.5 F (36.4 C) 97.7 F (36.5 C)   TempSrc:  Oral Oral   SpO2: 100% 98% 100% 95%  Weight:  38 kg    Height:        Intake/Output Summary (Last 24 hours) at 04/23/2022 0751 Last data filed at 04/23/2022 0600 Gross per 24 hour  Intake 118 ml  Output 1850 ml  Net -1732 ml   Filed Weights   04/21/22 0430 04/22/22 0349 04/23/22 0351  Weight: 40.9 kg 38.4 kg 38 kg    General: Appearance:    Thin female in no acute distress     Lungs:     Chest tube in place, respirations unlabored  Heart:    Normal heart rate.   MS:   All extremities are intact.   Neurologic:   Awake, alert, oriented x 3    Data Reviewed: I have personally reviewed following labs and imaging studies  CBC: Recent Labs  Lab 04/18/22 0442 04/19/22 0507 04/20/22 0347 04/21/22 0430 04/22/22 0430  WBC 10.8* 9.1 9.0 8.5 6.4  NEUTROABS 9.2* 7.3 7.2 6.5  --   HGB 9.0* 9.0* 9.4* 9.3* 9.2*  HCT 29.1* 28.3* 30.4* 28.9* 28.3*  MCV 79.1* 79.5* 79.4* 77.5* 76.3*  PLT 79* 79* 80* PLATELET CLUMPS NOTED ON SMEAR, UNABLE TO ESTIMATE 84*    Basic Metabolic Panel: Recent Labs  Lab 04/17/22 0520 04/18/22 0442 04/19/22 0507 04/20/22 0347 04/21/22 0430 04/22/22 0430 04/23/22 0441  NA 130* 133* 136 136 134* 128* 130*  K 3.8 3.4* 3.7 4.7 3.9 3.4* 4.2  CL 103 103 106 108 102 96* 98  CO2 19* 20* '22 23 23 26 27  '$ GLUCOSE 79 100* 127* 101* 96 111* 102*  BUN '11 16 16 20 20 19 20  '$ CREATININE 0.59 0.60 0.72 0.70 0.63 0.70 0.77  CALCIUM 8.2* 8.2* 8.0* 7.9* 8.1* 8.0* 8.1*  MG  --  1.8 1.9 1.8 1.5* 1.6*  --   PHOS 2.5 2.5 2.6 2.8 3.6  --   --     GFR: Estimated Creatinine Clearance: 47.7 mL/min (by C-G formula based on SCr of 0.77 mg/dL).  Liver Function Tests: Recent Labs  Lab 04/17/22 0520 04/18/22 0442 04/19/22 0507 04/20/22 0347 04/21/22 0430 04/22/22 0430  AST 31  --  33 32 37 43*  ALT 18  --  '15 16 18 18  '$ ALKPHOS 48  --  56 54 60 71   BILITOT 2.5*  --  2.2* 2.7* 2.8* 2.6*  PROT 5.4*  --  5.6* 5.2* 4.9* 5.0*  ALBUMIN 3.4* 4.2 3.5 3.1* 2.7* 2.6*    CBG: No results for input(s): GLUCAP in the last 168 hours.   Recent Results (from the past 240 hour(s))  Body fluid culture w Gram Stain     Status: None   Collection Time: 04/16/22  8:38 AM   Specimen: Pleura; Body Fluid  Result Value Ref Range Status   Specimen Description   Final    PLEURAL LT Performed at [place] [address] [place], [place] [address]  Special Requests   Final    Normal Performed at [facility], [place] [address] [place], [place] [address]    Gram Stain   Final    WBC PRESENT, PREDOMINANTLY PMN NO ORGANISMS SEEN CYTOSPIN SMEAR    Culture   Final    NO GROWTH 3 DAYS Performed at [facility], [place] [address] [place], [place] [address]    Report Status 04/19/2022 FINAL  Final  Resp Panel by RT-PCR (Flu A&B, Covid) Nasopharyngeal Swab     Status: None   Collection Time: 04/18/22  9:52 AM   Specimen: Nasopharyngeal Swab; Nasopharyngeal(NP) swabs in vial transport medium  Result Value Ref Range Status   SARS Coronavirus 2 by RT PCR NEGATIVE NEGATIVE Final    Comment: (NOTE) SARS-CoV-2 target nucleic acids are NOT DETECTED.  The SARS-CoV-2 RNA is generally detectable in upper respiratory specimens during the acute phase of infection. The lowest concentration of SARS-CoV-2 viral copies this assay can detect is 138 copies/mL. A negative result does not preclude SARS-Cov-2 infection and should not be used as the sole basis for treatment or other patient management decisions. A negative result may occur with  improper specimen collection/handling, submission of specimen other than nasopharyngeal swab, presence of viral mutation(s) within the areas targeted by this assay, and inadequate number of viral copies(<138 copies/mL). A negative result must be combined with clinical observations,  patient history, and epidemiological information. The expected result is Negative.  Fact Sheet for Patients:  EntrepreneurPulse.com.au  Fact Sheet for Healthcare Providers:  IncredibleEmployment.be  This test is no t yet approved or cleared by the [place] FDA and  has been authorized for detection and/or diagnosis of SARS-CoV-2 by FDA under an Emergency Use Authorization (EUA). This EUA will remain  in effect (meaning this test can be used) for the duration of the COVID-19 declaration under Section 564(b)(1) of the Act, 21 U.S.C.section 360bbb-3(b)(1), unless the authorization is terminated  or revoked sooner.       Influenza A by PCR NEGATIVE NEGATIVE Final   Influenza B by PCR NEGATIVE NEGATIVE Final    Comment: (NOTE) The Xpert Xpress SARS-CoV-2/FLU/RSV plus assay is intended as an aid in the diagnosis of influenza from Nasopharyngeal swab specimens and should not be used as a sole basis for treatment. Nasal washings and aspirates are unacceptable for Xpert Xpress SARS-CoV-2/FLU/RSV testing.  Fact Sheet for Patients: EntrepreneurPulse.com.au  Fact Sheet for Healthcare Providers: IncredibleEmployment.be  This test is not yet approved or cleared by the [place] FDA and has been authorized for detection and/or diagnosis of SARS-CoV-2 by FDA under an Emergency Use Authorization (EUA). This EUA will remain in effect (meaning this test can be used) for the duration of the COVID-19 declaration under Section 564(b)(1) of the Act, 21 U.S.C. section 360bbb-3(b)(1), unless the authorization is terminated or revoked.  Performed at [facility], [place] [address], [place], [place] [address]   MRSA Next Gen by PCR, Nasal     Status: None   Collection Time: 04/19/22  2:23 PM   Specimen: Nasal Mucosa; Nasal Swab  Result Value Ref Range Status   MRSA by PCR Next Gen NOT DETECTED NOT  DETECTED Final    Comment: (NOTE) The GeneXpert MRSA Assay (FDA approved for NASAL specimens only), is one component of a comprehensive MRSA colonization surveillance program. It is not intended to diagnose MRSA infection nor to guide or monitor treatment for MRSA infections. Test performance is not FDA approved in  patients less than 81 years old. Performed at [facility], [place] [address] [place], [place] [address]   Culture, body fluid w Gram Stain-bottle     Status: None (Preliminary result)   Collection Time: 04/19/22  2:23 PM   Specimen: Pleura  Result Value Ref Range Status   Specimen Description PLEURAL  Final   Special Requests NONE  Final   Culture   Final    NO GROWTH 4 DAYS Performed at [facility], [address], [place], [place] [address]    Report Status PENDING  Incomplete  Gram stain     Status: None   Collection Time: 04/19/22  2:23 PM   Specimen: Pleura  Result Value Ref Range Status   Specimen Description PLEURAL  Final   Special Requests NONE  Final   Gram Stain   Final    WBC PRESENT,BOTH PMN AND MONONUCLEAR NO ORGANISMS SEEN CYTOSPIN SMEAR Performed at [facility], [address], [place], [place] [address]    Report Status 04/20/2022 FINAL  Final  Respiratory (~20 pathogens) panel by PCR     Status: None   Collection Time: 04/19/22  2:59 PM  Result Value Ref Range Status   Adenovirus NOT DETECTED NOT DETECTED Final   Coronavirus 229E NOT DETECTED NOT DETECTED Final    Comment: (NOTE) The Coronavirus on the Respiratory Panel, DOES NOT test for the novel  Coronavirus (2019 nCoV)    Coronavirus HKU1 NOT DETECTED NOT DETECTED Final   Coronavirus NL63 NOT DETECTED NOT DETECTED Final   Coronavirus OC43 NOT DETECTED NOT DETECTED Final   Metapneumovirus NOT DETECTED NOT DETECTED Final   Rhinovirus / Enterovirus NOT DETECTED NOT DETECTED Final   Influenza A NOT DETECTED NOT DETECTED Final   Influenza B NOT DETECTED NOT  DETECTED Final   Parainfluenza Virus 1 NOT DETECTED NOT DETECTED Final   Parainfluenza Virus 2 NOT DETECTED NOT DETECTED Final   Parainfluenza Virus 3 NOT DETECTED NOT DETECTED Final   Parainfluenza Virus 4 NOT DETECTED NOT DETECTED Final   Respiratory Syncytial Virus NOT DETECTED NOT DETECTED Final   Bordetella pertussis NOT DETECTED NOT DETECTED Final   Bordetella Parapertussis NOT DETECTED NOT DETECTED Final   Chlamydophila pneumoniae NOT DETECTED NOT DETECTED Final   Mycoplasma pneumoniae NOT DETECTED NOT DETECTED Final    Comment: Performed at [facility], [address] [place], [place] [address]  Culture, blood (Routine X 2) w Reflex to ID Panel     Status: None (Preliminary result)   Collection Time: 04/19/22  3:35 PM   Specimen: BLOOD  Result Value Ref Range Status   Specimen Description   Final    BLOOD BLOOD LEFT FOREARM Performed at [place] [address], [place], [place] [address]    Special Requests   Final    BOTTLES DRAWN AEROBIC AND ANAEROBIC Blood Culture adequate volume Performed at [place] [address], [place], [place] [address]    Culture   Final    NO GROWTH 4 DAYS Performed at [facility], [place] [address], [place],  [address]    Report Status PENDING  Incomplete  Culture, blood (Routine X 2) w Reflex to ID Panel     Status: None (Preliminary result)   Collection Time: 04/19/22  3:35 PM   Specimen: BLOOD  Result Value Ref Range Status   Specimen Description   Final    BLOOD LEFT ANTECUBITAL Performed  at [facility], [place] [address] [place], [place] [address]    Special Requests   Final    BOTTLES DRAWN AEROBIC AND ANAEROBIC Blood Culture results may not be optimal due to an inadequate volume of blood received in culture bottles Performed at [place] [address], [place], [place] [address]    Culture   Final    NO GROWTH 4  DAYS Performed at [facility], [place] [address], [place], [place] [address]    Report Status PENDING  Incomplete      Radiology Studies: DG Chest Port 1 View  Result Date: 04/22/2022 CLINICAL DATA:  Chest tube EXAM: PORTABLE CHEST 1 VIEW COMPARISON:  Chest x-ray dated Apr 19, 2022 FINDINGS: Unchanged position of right arm PICC. Trace left pleural effusion, decreased in size when compared to prior exam. Left-sided chest tube in place. Trace right pleural effusion. No evidence of pneumothorax. Improved aeration of the right lung. visible nodular opacity of the lower right lung. IMPRESSION: 1. Trace left pleural effusion, decreased in size when compared to prior exam. Left-sided chest tube in place. 2. Improved aeration of the right lung. Newly visible nodular opacity of the lower right lung, possibly a nipple shadow. Recommend follow-up exam with nipple markers. Electronically Signed   By: Yetta Glassman M.D.   On: 04/22/2022 08:49      Scheduled Meds:  Chlorhexidine Gluconate Cloth  6 each Topical Daily   enoxaparin (LOVENOX) injection  30 mg Subcutaneous Q24H   feeding supplement (KATE FARMS STANDARD 1.4)  325 mL Oral Daily   furosemide  40 mg Intravenous Daily   lidocaine  1 patch Transdermal Q24H   magnesium oxide  400 mg Oral BID   midodrine  15 mg Oral TID WC   multivitamin with minerals  1 tablet Oral Daily   nicotine  21 mg Transdermal Daily   pantoprazole  40 mg Oral Daily   sodium chloride flush  10-40 mL Intracatheter Q12H   sodium chloride flush  3 mL Intravenous Q12H   tamoxifen  20 mg Oral Daily   Continuous Infusions:  ceFEPime (MAXIPIME) IV 2 g (04/23/22 0441)     LOS: 8 days    Geradine Girt, DO [facility] 04/23/2022, 7:51 AM

## 2022-04-24 DIAGNOSIS — J9621 Acute and chronic respiratory failure with hypoxia: Secondary | ICD-10-CM | POA: Diagnosis not present

## 2022-04-24 LAB — CULTURE, BODY FLUID W GRAM STAIN -BOTTLE: Culture: NO GROWTH

## 2022-04-24 LAB — IRON AND TIBC
Iron: 14 ug/dL — ABNORMAL LOW (ref 28–170)
Saturation Ratios: 8 % — ABNORMAL LOW (ref 10.4–31.8)
TIBC: 181 ug/dL — ABNORMAL LOW (ref 250–450)
UIBC: 167 ug/dL

## 2022-04-24 LAB — BASIC METABOLIC PANEL
Anion gap: 4 — ABNORMAL LOW (ref 5–15)
BUN: 25 mg/dL — ABNORMAL HIGH (ref 6–20)
CO2: 26 mmol/L (ref 22–32)
Calcium: 7.6 mg/dL — ABNORMAL LOW (ref 8.9–10.3)
Chloride: 99 mmol/L (ref 98–111)
Creatinine, Ser: 0.84 mg/dL (ref 0.44–1.00)
GFR, Estimated: >60 mL/min (ref >60)
Glucose, Bld: 117 mg/dL — ABNORMAL HIGH (ref 70–99)
Potassium: 4.4 mmol/L (ref 3.5–5.1)
Sodium: 129 mmol/L — ABNORMAL LOW (ref 135–145)

## 2022-04-24 LAB — CULTURE, BLOOD (ROUTINE X 2)
Culture: NO GROWTH
Culture: NO GROWTH
Special Requests: ADEQUATE

## 2022-04-24 LAB — MAGNESIUM: Magnesium: 2.2 mg/dL (ref 1.7–2.4)

## 2022-04-24 LAB — FERRITIN: Ferritin: 98 ng/mL (ref 11–307)

## 2022-04-24 NOTE — Progress Notes (Signed)
PROGRESS NOTE    Theresa Rocha  MHD:[id number] DOB: [birth year] DOA: 04/14/2022 PCP: Seward Carol, MD   Brief Narrative:  Theresa Rocha 56 y.o. female with history of right-sided breast cancer, hepatocellular carcinoma, cirrhosis, and recurrent left pleural effusions who was transferred from [facility] for management of her recurrent effusion. She states that in October she began experiencing some shortness of breath and was diagnosed with a left pleural effusion.  This came shortly after completion of her ablation therapy to her liver for her [place].  She has had multiple thoracentesis, and also had a Pleurx catheter placed.  Na continues to be an issue.  Assessment & Plan:   Principal Problem:   Acute on chronic respiratory failure with hypoxia (HCC) Active Problems:   Hyponatremia   SIRS (systemic inflammatory response syndrome) (HCC)   Hyperkalemia   Decompensated HCV cirrhosis (HCC)   Pleural effusion on left   Hypotension   Hepatocellular carcinoma (HCC)   Thrombocytopenia (HCC)   Anemia   Breast cancer (HCC)   Protein-calorie malnutrition, severe (HCC)  Acute on chronic respiratory failure with hypoxia/left pleural effusion:  Pleural fluid transudative without growth.. Patient now comfortable on room air, still has had more than 600 cc output from the tube, seen by CTS, plan to change to Pleurx tomorrow.  Patient very comfortable and eager to go home.  Will discontinue IV Lasix.    Decompensated HCV cirrhosis (HCC) Elevated hepatitis C and alcohol use disorder.  History of esophageal varices portal gastropathy.  Has multiple hepatocellular cancer status post microwave ablation 08/2021, s/p [id number] radioembolization to R lobe of liver on 1/24.  Follows up with Roosevelt Locks NP at [facility] and [facility] seen by GI during hospitalization as well.   Hyponatremia Nephrology: last seen 5/21: Per their note, "hyponatremia - in a pt w/ cirrhosis and portal HTN.  Treatment of hyponatremia in cirrhotics is unique in that the main goal is to improve blood pressures, which should then improve renal perfusion and therefore water clearance. NS IVF's dc'd and started on midodrine 12.5 tid and IV albumin. Has completed albumin course. Would cont midodrine for now and can taper down the dose if needed to maintain MAP > 80 and Na > 128 if possible. Can keep her on the midodrine at dc as well if there is a PCP to follow her.  No other suggestions, will sign off".  ACTH stimulation test normal.  TSH was normal.  -correct initially but now back down -lasix decreased due to urine Na < 10. Sodium fairly stable.   Hypokalemia: Resolved.   Hypomagnesemia: Resolved.  Hypotension Status post albumin.  Thought to be secondary to cirrhosis of liver.  Still borderline low blood pressure. -Continue increased dose of midodrine 15 mg 3 times daily.   Anemia of chronic disease. Hemoglobin of 9.3 at this time.  Continue to monitor. -low Fe levels in past- check   Chronic thrombocytopenia ([place]): Secondary to alcoholism.  Stable.  No bleeding.  History of breast cancer ([place]) Follows with [name] oncology as outpatient.  Oncology following while in the hospital. -last seen 5/23:  plan to repeat her abdominal MRI after discharge for her [facility] follow up   Protein-calorie malnutrition, severe ([place]) Nutrition Status: Nutrition Problem: Severe Malnutrition Etiology: chronic illness (cancer, cirrhosis) Signs/Symptoms: severe muscle depletion, severe fat depletion Interventions: MVI, Other (Comment), Liberalize Diet    DVT prophylaxis: enoxaparin (LOVENOX) injection 30 mg Start: 04/14/22 2230   Code Status: Full Code  Family Communication:  None present at bedside.  Plan of care discussed with patient in length and he/she verbalized understanding and agreed with it.  Status is: Inpatient Remains inpatient appropriate because: Still has chest tube.   Estimated body mass  index is 14.74 kg/m as calculated from the following:   Height as of this encounter: '5\' 3"'$  (1.6 m).   Weight as of this encounter: 37.7 kg.    Nutritional Assessment: Body mass index is 14.74 kg/m.Marland Kitchen Seen by dietician.  I agree with the assessment and plan as outlined below: Nutrition Status: Nutrition Problem: Severe Malnutrition Etiology: chronic illness (cancer, cirrhosis) Signs/Symptoms: severe muscle depletion, severe fat depletion Interventions: MVI, Other (Comment), Liberalize Diet  . Skin Assessment: I have examined the patient's skin and I agree with the wound assessment as performed by the wound care RN as outlined below:    Consultants:  CTS  Procedures:  Chest tube  Antimicrobials:  Anti-infectives (From admission, onward)    Start     Dose/Rate Route Frequency Ordered Stop   04/20/22 1600  vancomycin (VANCOCIN) IVPB 1000 mg/200 mL premix  Status:  Discontinued        1,000 mg 200 mL/hr over 60 Minutes Intravenous Every 24 hours 04/19/22 1444 04/19/22 1544   04/20/22 1400  vancomycin (VANCOREADY) IVPB 500 mg/100 mL  Status:  Discontinued        500 mg 100 mL/hr over 60 Minutes Intravenous Every 24 hours 04/19/22 1604 04/20/22 1145   04/20/22 0845  fluconazole (DIFLUCAN) tablet 150 mg        150 mg Oral  Once 04/20/22 0747 04/20/22 0941   04/19/22 1645  vancomycin (VANCOREADY) IVPB 750 mg/150 mL  Status:  Discontinued        750 mg 150 mL/hr over 60 Minutes Intravenous  Once 04/19/22 1552 04/19/22 1604   04/19/22 1645  vancomycin (VANCOREADY) IVPB 750 mg/150 mL        750 mg 150 mL/hr over 60 Minutes Intravenous  Once 04/19/22 1605 04/19/22 1753   04/19/22 1600  ceFEPIme (MAXIPIME) 2 g in sodium chloride 0.9 % 100 mL IVPB  Status:  Discontinued        2 g 200 mL/hr over 30 Minutes Intravenous Every 8 hours 04/19/22 1441 04/19/22 1545   04/19/22 1600  ceFEPIme (MAXIPIME) 2 g in sodium chloride 0.9 % 100 mL IVPB        2 g 200 mL/hr over 30 Minutes  Intravenous Every 12 hours 04/19/22 1545     04/19/22 1530  vancomycin (VANCOCIN) IVPB 1000 mg/200 mL premix  Status:  Discontinued        1,000 mg 200 mL/hr over 60 Minutes Intravenous  Once 04/19/22 1441 04/19/22 1544   04/16/22 1800  Ampicillin-Sulbactam (UNASYN) 3 g in sodium chloride 0.9 % 100 mL IVPB  Status:  Discontinued        3 g 200 mL/hr over 30 Minutes Intravenous Every 6 hours 04/16/22 1139 04/19/22 1421   04/16/22 1000  Ampicillin-Sulbactam (UNASYN) 3 g in sodium chloride 0.9 % 100 mL IVPB  Status:  Discontinued        3 g 200 mL/hr over 30 Minutes Intravenous Every 6 hours 04/16/22 0935 04/16/22 1139         Subjective: Seen and examined.  She has no complaints.  She feels so comfortable that she wants to go home.  Objective: Vitals:   04/24/22 0523 04/24/22 0914 04/24/22 0940 04/24/22 1233  BP: (!) 88/64 91/60  Marland Kitchen)  90/55  Pulse: 82 76 87 76  Resp: '16 18 14 17  '$ Temp: 98 F (36.7 C) 98 F (36.7 C)  98.1 F (36.7 C)  TempSrc: Oral Oral  Oral  SpO2: 94% 97% 96% 95%  Weight: 37.7 kg     Height:        Intake/Output Summary (Last 24 hours) at 04/24/2022 1316 Last data filed at 04/24/2022 0930 Gross per 24 hour  Intake 770 ml  Output 1050 ml  Net -280 ml   Filed Weights   04/22/22 0349 04/23/22 0351 04/24/22 0523  Weight: 38.4 kg 38 kg 37.7 kg    Examination:  General exam: Appears calm and comfortable  Respiratory system: Clear to auscultation. Respiratory effort normal. Cardiovascular system: S1 & S2 heard, RRR. No JVD, murmurs, rubs, gallops or clicks. No pedal edema.  Chest tube noted on the left side. Gastrointestinal system: Abdomen is nondistended, soft and nontender. No organomegaly or masses felt. Normal bowel sounds heard. Central nervous system: Alert and oriented. No focal neurological deficits. Extremities: Symmetric 5 x 5 power. Skin: No rashes, lesions or ulcers Psychiatry: Judgement and insight appear normal. Mood & affect appropriate.     Data Reviewed: I have personally reviewed following labs and imaging studies  CBC: Recent Labs  Lab 04/18/22 0442 04/19/22 0507 04/20/22 0347 04/21/22 0430 04/22/22 0430  WBC 10.8* 9.1 9.0 8.5 6.4  NEUTROABS 9.2* 7.3 7.2 6.5  --   HGB 9.0* 9.0* 9.4* 9.3* 9.2*  HCT 29.1* 28.3* 30.4* 28.9* 28.3*  MCV 79.1* 79.5* 79.4* 77.5* 76.3*  PLT 79* 79* 80* PLATELET CLUMPS NOTED ON SMEAR, UNABLE TO ESTIMATE 84*   Basic Metabolic Panel: Recent Labs  Lab 04/18/22 0442 04/19/22 0507 04/20/22 0347 04/21/22 0430 04/22/22 0430 04/23/22 0441 04/24/22 0522  NA 133* 136 136 134* 128* 130* 129*  K 3.4* 3.7 4.7 3.9 3.4* 4.2 4.4  CL 103 106 108 102 96* 98 99  CO2 20* '22 23 23 26 27 26  '$ GLUCOSE 100* 127* 101* 96 111* 102* 117*  BUN '16 16 20 20 19 20 '$ 25*  CREATININE 0.60 0.72 0.70 0.63 0.70 0.77 0.84  CALCIUM 8.2* 8.0* 7.9* 8.1* 8.0* 8.1* 7.6*  MG 1.8 1.9 1.8 1.5* 1.6*  --  2.2  PHOS 2.5 2.6 2.8 3.6  --   --   --    GFR: Estimated Creatinine Clearance: 45 mL/min (by C-G formula based on SCr of 0.84 mg/dL). Liver Function Tests: Recent Labs  Lab 04/18/22 0442 04/19/22 0507 04/20/22 0347 04/21/22 0430 04/22/22 0430  AST  --  33 32 37 43*  ALT  --  '15 16 18 18  '$ ALKPHOS  --  56 54 60 71  BILITOT  --  2.2* 2.7* 2.8* 2.6*  PROT  --  5.6* 5.2* 4.9* 5.0*  ALBUMIN 4.2 3.5 3.1* 2.7* 2.6*   No results for input(s): LIPASE, AMYLASE in the last 168 hours. No results for input(s): AMMONIA in the last 168 hours. Coagulation Profile: No results for input(s): INR, PROTIME in the last 168 hours. Cardiac Enzymes: No results for input(s): CKTOTAL, CKMB, CKMBINDEX, TROPONINI in the last 168 hours. BNP (last 3 results) No results for input(s): PROBNP in the last 8760 hours. HbA1C: No results for input(s): HGBA1C in the last 72 hours. CBG: No results for input(s): GLUCAP in the last 168 hours. Lipid Profile: No results for input(s): CHOL, HDL, LDLCALC, TRIG, CHOLHDL, LDLDIRECT in the last 72  hours. Thyroid Function Tests: No results for  input(s): TSH, T4TOTAL, FREET4, T3FREE, THYROIDAB in the last 72 hours. Anemia Panel: Recent Labs    04/24/22 0522  FERRITIN 98  TIBC 181*  IRON 14*   Sepsis Labs: No results for input(s): PROCALCITON, LATICACIDVEN in the last 168 hours.  Recent Results (from the past 240 hour(s))  Body fluid culture w Gram Stain     Status: None   Collection Time: 04/16/22  8:38 AM   Specimen: Pleura; Body Fluid  Result Value Ref Range Status   Specimen Description   Final    PLEURAL LT Performed at [facility] [address], [place], [place] [address]    Special Requests   Final    Normal Performed at [facility], [place] [address] [place], [place] [address]    Gram Stain   Final    WBC PRESENT, PREDOMINANTLY PMN NO ORGANISMS SEEN CYTOSPIN SMEAR    Culture   Final    NO GROWTH 3 DAYS Performed at [facility], [place] [address], [place], [place] [address]    Report Status 04/19/2022 FINAL  Final  Resp Panel by RT-PCR (Flu A&B, Covid) Nasopharyngeal Swab     Status: None   Collection Time: 04/18/22  9:52 AM   Specimen: Nasopharyngeal Swab; Nasopharyngeal(NP) swabs in vial transport medium  Result Value Ref Range Status   SARS Coronavirus 2 by RT PCR NEGATIVE NEGATIVE Final    Comment: (NOTE) SARS-CoV-2 target nucleic acids are NOT DETECTED.  The SARS-CoV-2 RNA is generally detectable in upper respiratory specimens during the acute phase of infection. The lowest concentration of SARS-CoV-2 viral copies this assay can detect is 138 copies/mL. A negative result does not preclude SARS-Cov-2 infection and should not be used as the sole basis for treatment or other patient management decisions. A negative result may occur with  improper specimen collection/handling, submission of specimen other than nasopharyngeal swab, presence of viral mutation(s) within the areas targeted by this assay, and  inadequate number of viral copies(<138 copies/mL). A negative result must be combined with clinical observations, patient history, and epidemiological information. The expected result is Negative.  Fact Sheet for Patients:  EntrepreneurPulse.com.au  Fact Sheet for Healthcare Providers:  IncredibleEmployment.be  This test is no t yet approved or cleared by the [place] FDA and  has been authorized for detection and/or diagnosis of SARS-CoV-2 by FDA under an Emergency Use Authorization (EUA). This EUA will remain  in effect (meaning this test can be used) for the duration of the COVID-19 declaration under Section 564(b)(1) of the Act, 21 U.S.C.section 360bbb-3(b)(1), unless the authorization is terminated  or revoked sooner.       Influenza A by PCR NEGATIVE NEGATIVE Final   Influenza B by PCR NEGATIVE NEGATIVE Final    Comment: (NOTE) The Xpert Xpress SARS-CoV-2/FLU/RSV plus assay is intended as an aid in the diagnosis of influenza from Nasopharyngeal swab specimens and should not be used as a sole basis for treatment. Nasal washings and aspirates are unacceptable for Xpert Xpress SARS-CoV-2/FLU/RSV testing.  Fact Sheet for Patients: EntrepreneurPulse.com.au  Fact Sheet for Healthcare Providers: IncredibleEmployment.be  This test is not yet approved or cleared by the [place] FDA and has been authorized for detection and/or diagnosis of SARS-CoV-2 by FDA under an Emergency Use Authorization (EUA). This EUA will remain in effect (meaning this test can be used) for the duration of the COVID-19 declaration under Section 564(b)(1) of the Act, 21 U.S.C. section 360bbb-3(b)(1), unless the authorization is terminated  or revoked.  Performed at [facility], [place] [address], [place], [place] [address]   MRSA Next Gen by PCR, Nasal     Status: None   Collection Time: 04/19/22  2:23  PM   Specimen: Nasal Mucosa; Nasal Swab  Result Value Ref Range Status   MRSA by PCR Next Gen NOT DETECTED NOT DETECTED Final    Comment: (NOTE) The GeneXpert MRSA Assay (FDA approved for NASAL specimens only), is one component of a comprehensive MRSA colonization surveillance program. It is not intended to diagnose MRSA infection nor to guide or monitor treatment for MRSA infections. Test performance is not FDA approved in patients less than 68 years old. Performed at [facility], [place] [address] [place], [place] [address]   Culture, body fluid w Gram Stain-bottle     Status: None   Collection Time: 04/19/22  2:23 PM   Specimen: Pleura  Result Value Ref Range Status   Specimen Description PLEURAL  Final   Special Requests NONE  Final   Culture   Final    NO GROWTH 5 DAYS Performed at [facility], [address] [place], [place] [address]    Report Status 04/24/2022 FINAL  Final  Gram stain     Status: None   Collection Time: 04/19/22  2:23 PM   Specimen: Pleura  Result Value Ref Range Status   Specimen Description PLEURAL  Final   Special Requests NONE  Final   Gram Stain   Final    WBC PRESENT,BOTH PMN AND MONONUCLEAR NO ORGANISMS SEEN CYTOSPIN SMEAR Performed at [facility], [address] [place], [place] [address]    Report Status 04/20/2022 FINAL  Final  Respiratory (~20 pathogens) panel by PCR     Status: None   Collection Time: 04/19/22  2:59 PM  Result Value Ref Range Status   Adenovirus NOT DETECTED NOT DETECTED Final   Coronavirus 229E NOT DETECTED NOT DETECTED Final    Comment: (NOTE) The Coronavirus on the Respiratory Panel, DOES NOT test for the novel  Coronavirus (2019 nCoV)    Coronavirus HKU1 NOT DETECTED NOT DETECTED Final   Coronavirus NL63 NOT DETECTED NOT DETECTED Final   Coronavirus OC43 NOT DETECTED NOT DETECTED Final   Metapneumovirus NOT DETECTED NOT DETECTED Final   Rhinovirus / Enterovirus NOT DETECTED  NOT DETECTED Final   Influenza A NOT DETECTED NOT DETECTED Final   Influenza B NOT DETECTED NOT DETECTED Final   Parainfluenza Virus 1 NOT DETECTED NOT DETECTED Final   Parainfluenza Virus 2 NOT DETECTED NOT DETECTED Final   Parainfluenza Virus 3 NOT DETECTED NOT DETECTED Final   Parainfluenza Virus 4 NOT DETECTED NOT DETECTED Final   Respiratory Syncytial Virus NOT DETECTED NOT DETECTED Final   Bordetella pertussis NOT DETECTED NOT DETECTED Final   Bordetella Parapertussis NOT DETECTED NOT DETECTED Final   Chlamydophila pneumoniae NOT DETECTED NOT DETECTED Final   Mycoplasma pneumoniae NOT DETECTED NOT DETECTED Final    Comment: Performed at [facility], [address] [place], [place] [address]  Culture, blood (Routine X 2) w Reflex to ID Panel     Status: None   Collection Time: 04/19/22  3:35 PM   Specimen: BLOOD  Result Value Ref Range Status   Specimen Description   Final    BLOOD BLOOD LEFT FOREARM Performed at [facility] [address], [place], [place] [address]    Special Requests   Final  BOTTLES DRAWN AEROBIC AND ANAEROBIC Blood Culture adequate volume Performed at [facility] [address], [place], [place] [address]    Culture   Final    NO GROWTH 5 DAYS Performed at [facility], [place] [address], [place], [place] [address]    Report Status 04/24/2022 FINAL  Final  Culture, blood (Routine X 2) w Reflex to ID Panel     Status: None   Collection Time: 04/19/22  3:35 PM   Specimen: BLOOD  Result Value Ref Range Status   Specimen Description   Final    BLOOD LEFT ANTECUBITAL Performed at [facility] [address], [place], [place] [address]    Special Requests   Final    BOTTLES DRAWN AEROBIC AND ANAEROBIC Blood Culture results may not be optimal due to an inadequate volume of blood received in culture bottles Performed at [facility] [address]  [place], [place] [address]    Culture   Final    NO GROWTH 5 DAYS Performed at [facility], [place] [address], [place], [place] [address]    Report Status 04/24/2022 FINAL  Final     Radiology Studies: No results found.  Scheduled Meds:  Chlorhexidine Gluconate Cloth  6 each Topical Daily   enoxaparin (LOVENOX) injection  30 mg Subcutaneous Q24H   feeding supplement (KATE FARMS STANDARD 1.4)  325 mL Oral Daily   furosemide  40 mg Intravenous Daily   lidocaine  1 patch Transdermal Q24H   magnesium oxide  400 mg Oral BID   midodrine  15 mg Oral TID WC   multivitamin with minerals  1 tablet Oral Daily   nicotine  21 mg Transdermal Daily   pantoprazole  40 mg Oral Daily   sodium chloride flush  10-40 mL Intracatheter Q12H   sodium chloride flush  3 mL Intravenous Q12H   tamoxifen  20 mg Oral Daily   Continuous Infusions:  ceFEPime (MAXIPIME) IV 2 g (04/24/22 0323)     LOS: 9 days   Darliss Cheney, MD [facility]  04/24/2022, 1:16 PM   *Please note that this is a verbal dictation therefore any spelling or grammatical errors are due to the "[place] One" system interpretation.  Please page via [place] and do not message via secure chat for urgent patient care matters. Secure chat can be used for non urgent patient care matters.  How to contact the [facility] Attending or Consulting provider [place] or covering provider during after hours [place], for this patient?  Check the care team in [facility] and look for a) attending/consulting [facility] provider listed and b) the [facility] team listed. Page or secure chat [phone]. Log into [email or web address] and use [place]'s universal password to access. If you do not have the password, please contact the hospital operator. Locate the [facility] provider you are looking for under [facility] and page to a number that you can be directly reached. If you still have difficulty reaching the provider, please page the [facility] (Director on Call) for the Hospitalists  listed on amion for assistance.

## 2022-04-24 NOTE — Progress Notes (Signed)
     [address]       [place] [address]             [phone]       No events CT output decreasing Will transition to Pleurx drainage tomorrow  Lajuana Matte '

## 2022-04-25 DIAGNOSIS — J9621 Acute and chronic respiratory failure with hypoxia: Secondary | ICD-10-CM | POA: Diagnosis not present

## 2022-04-25 LAB — BASIC METABOLIC PANEL
Anion gap: 5 (ref 5–15)
BUN: 30 mg/dL — ABNORMAL HIGH (ref 6–20)
CO2: 25 mmol/L (ref 22–32)
Calcium: 7.8 mg/dL — ABNORMAL LOW (ref 8.9–10.3)
Chloride: 101 mmol/L (ref 98–111)
Creatinine, Ser: 1.06 mg/dL — ABNORMAL HIGH (ref 0.44–1.00)
GFR, Estimated: >60 mL/min (ref >60)
Glucose, Bld: 103 mg/dL — ABNORMAL HIGH (ref 70–99)
Potassium: 4.6 mmol/L (ref 3.5–5.1)
Sodium: 131 mmol/L — ABNORMAL LOW (ref 135–145)

## 2022-04-25 NOTE — Progress Notes (Signed)
PROGRESS NOTE    Theresa Rocha  KXF:[id number] DOB: Nov 14, 1966 DOA: 04/14/2022 PCP: Seward Carol, MD   Brief Narrative:  Theresa Rocha 56 y.o. female with history of right-sided breast cancer, hepatocellular carcinoma, cirrhosis, and recurrent left pleural effusions who was transferred from [facility] for management of her recurrent effusion. She states that in October she began experiencing some shortness of breath and was diagnosed with a left pleural effusion.  This came shortly after completion of her ablation therapy to her liver for her [place].  She has had multiple thoracentesis, and also had a Pleurx catheter placed.  Na continues to be an issue.  Assessment & Plan:   Principal Problem:   Acute on chronic respiratory failure with hypoxia (HCC) Active Problems:   Hyponatremia   SIRS (systemic inflammatory response syndrome) (HCC)   Hyperkalemia   Decompensated HCV cirrhosis (HCC)   Pleural effusion on left   Hypotension   Hepatocellular carcinoma (HCC)   Thrombocytopenia (HCC)   Anemia   Breast cancer (HCC)   Protein-calorie malnutrition, severe (HCC)  Acute on chronic respiratory failure with hypoxia/left pleural effusion:  Pleural fluid transudative without growth.. Patient now comfortable on room air, still has had more than a liter output from the tube, seen by CTS yesterday, discussed with Dr. Kipp Brood via secure chat, per him, Once her chest tube output is under a liter, then we can switch her to a Pleurx catheter.  Patient very comfortable and eager to go home.  IV Lasix discontinued yesterday, creatinine slightly up today.  We will hold off to Lasix.  Decompensated HCV cirrhosis (HCC) Elevated hepatitis C and alcohol use disorder.  History of esophageal varices portal gastropathy.  Has multiple hepatocellular cancer status post microwave ablation 08/2021, s/p [id number] radioembolization to R lobe of liver on 1/24.  Follows up with Roosevelt Locks NP at [facility] and [facility] seen by GI during hospitalization as well.   Hyponatremia Nephrology: last seen 5/21: Per their note, "hyponatremia - in a pt w/ cirrhosis and portal HTN. Treatment of hyponatremia in cirrhotics is unique in that the main goal is to improve blood pressures, which should then improve renal perfusion and therefore water clearance. NS IVF's dc'd and started on midodrine 12.5 tid and IV albumin. Has completed albumin course. Would cont midodrine for now and can taper down the dose if needed to maintain MAP > 80 and Na > 128 if possible. Can keep her on the midodrine at dc as well if there is a PCP to follow her.  No other suggestions, will sign off".  ACTH stimulation test normal.  TSH was normal.  -correct initially but now back down -lasix decreased due to urine Na < 10. Sodium fairly stable.   Hypokalemia: Resolved.   Hypomagnesemia: Resolved.  Hypotension Status post albumin.  Thought to be secondary to cirrhosis of liver.  Still borderline low blood pressure. -Continue increased dose of midodrine 15 mg 3 times daily.   Anemia of chronic disease. Hemoglobin of 9.3 at this time.  Continue to monitor. -low Fe levels in past- check   Chronic thrombocytopenia ([place]): Secondary to alcoholism.  Stable.  No bleeding.  History of breast cancer ([place]) Follows with [name] oncology as outpatient.  Oncology following while in the hospital. -last seen 5/23:  plan to repeat her abdominal MRI after discharge for her [place] follow up   Protein-calorie malnutrition, severe ([place]) Nutrition Status: Nutrition Problem: Severe Malnutrition Etiology: chronic illness (cancer, cirrhosis) Signs/Symptoms:  severe muscle depletion, severe fat depletion Interventions: MVI, Other (Comment), Liberalize Diet    DVT prophylaxis: enoxaparin (LOVENOX) injection 30 mg Start: 04/14/22 2230   Code Status: Full Code  Family Communication:  None present at bedside.  Plan of care discussed with patient in  length and he/she verbalized understanding and agreed with it.  Status is: Inpatient Remains inpatient appropriate because: Still has chest tube.   Estimated body mass index is 14.49 kg/m as calculated from the following:   Height as of this encounter: '5\' 3"'$  (1.6 m).   Weight as of this encounter: 37.1 kg.    Nutritional Assessment: Body mass index is 14.49 kg/m.Marland Kitchen Seen by dietician.  I agree with the assessment and plan as outlined below: Nutrition Status: Nutrition Problem: Severe Malnutrition Etiology: chronic illness (cancer, cirrhosis) Signs/Symptoms: severe muscle depletion, severe fat depletion Interventions: MVI, Other (Comment), Liberalize Diet  . Skin Assessment: I have examined the patient's skin and I agree with the wound assessment as performed by the wound care RN as outlined below:    Consultants:  CTS  Procedures:  Chest tube  Antimicrobials:  Anti-infectives (From admission, onward)    Start     Dose/Rate Route Frequency Ordered Stop   04/20/22 1600  vancomycin (VANCOCIN) IVPB 1000 mg/200 mL premix  Status:  Discontinued        1,000 mg 200 mL/hr over 60 Minutes Intravenous Every 24 hours 04/19/22 1444 04/19/22 1544   04/20/22 1400  vancomycin (VANCOREADY) IVPB 500 mg/100 mL  Status:  Discontinued        500 mg 100 mL/hr over 60 Minutes Intravenous Every 24 hours 04/19/22 1604 04/20/22 1145   04/20/22 0845  fluconazole (DIFLUCAN) tablet 150 mg        150 mg Oral  Once 04/20/22 0747 04/20/22 0941   04/19/22 1645  vancomycin (VANCOREADY) IVPB 750 mg/150 mL  Status:  Discontinued        750 mg 150 mL/hr over 60 Minutes Intravenous  Once 04/19/22 1552 04/19/22 1604   04/19/22 1645  vancomycin (VANCOREADY) IVPB 750 mg/150 mL        750 mg 150 mL/hr over 60 Minutes Intravenous  Once 04/19/22 1605 04/19/22 1753   04/19/22 1600  ceFEPIme (MAXIPIME) 2 g in sodium chloride 0.9 % 100 mL IVPB  Status:  Discontinued        2 g 200 mL/hr over 30 Minutes  Intravenous Every 8 hours 04/19/22 1441 04/19/22 1545   04/19/22 1600  ceFEPIme (MAXIPIME) 2 g in sodium chloride 0.9 % 100 mL IVPB  Status:  Discontinued        2 g 200 mL/hr over 30 Minutes Intravenous Every 12 hours 04/19/22 1545 04/25/22 1343   04/19/22 1530  vancomycin (VANCOCIN) IVPB 1000 mg/200 mL premix  Status:  Discontinued        1,000 mg 200 mL/hr over 60 Minutes Intravenous  Once 04/19/22 1441 04/19/22 1544   04/16/22 1800  Ampicillin-Sulbactam (UNASYN) 3 g in sodium chloride 0.9 % 100 mL IVPB  Status:  Discontinued        3 g 200 mL/hr over 30 Minutes Intravenous Every 6 hours 04/16/22 1139 04/19/22 1421   04/16/22 1000  Ampicillin-Sulbactam (UNASYN) 3 g in sodium chloride 0.9 % 100 mL IVPB  Status:  Discontinued        3 g 200 mL/hr over 30 Minutes Intravenous Every 6 hours 04/16/22 0935 04/16/22 1139         Subjective:  Patient  seen and examined.  She has no complaints.  She is eager to go home again.  Husband at the bedside appears to be frustrated due to high output from the chest tube that patient is having.  He had a few questions regarding pleural effusion and chest tube that I advised him to ask from CT surgery directly.  Objective: Vitals:   04/25/22 0500 04/25/22 0523 04/25/22 0800 04/25/22 1204  BP:    95/64  Pulse: 80   75  Resp: '20 20  18  '$ Temp: 98.2 F (36.8 C) 98.2 F (36.8 C) 98.2 F (36.8 C) 98.3 F (36.8 C)  TempSrc: Oral Oral Oral Oral  SpO2: 99%   96%  Weight:  37.1 kg    Height:        Intake/Output Summary (Last 24 hours) at 04/25/2022 1348 Last data filed at 04/25/2022 0803 Gross per 24 hour  Intake 123 ml  Output 1420 ml  Net -1297 ml    Filed Weights   04/23/22 0351 04/24/22 0523 04/25/22 0523  Weight: 38 kg 37.7 kg 37.1 kg    Examination:  General exam: Appears calm and comfortable  Respiratory system: Clear to auscultation. Respiratory effort normal. Cardiovascular system: S1 & S2 heard, RRR. No JVD, murmurs, rubs,  gallops or clicks. No pedal edema. Gastrointestinal system: Abdomen is nondistended, soft and nontender. No organomegaly or masses felt. Normal bowel sounds heard. Central nervous system: Alert and oriented. No focal neurological deficits. Extremities: Symmetric 5 x 5 power. Skin: No rashes, lesions or ulcers.  Psychiatry: Judgement and insight appear normal. Mood & affect appropriate.   Data Reviewed: I have personally reviewed following labs and imaging studies  CBC: Recent Labs  Lab 04/19/22 0507 04/20/22 0347 04/21/22 0430 04/22/22 0430  WBC 9.1 9.0 8.5 6.4  NEUTROABS 7.3 7.2 6.5  --   HGB 9.0* 9.4* 9.3* 9.2*  HCT 28.3* 30.4* 28.9* 28.3*  MCV 79.5* 79.4* 77.5* 76.3*  PLT 79* 80* PLATELET CLUMPS NOTED ON SMEAR, UNABLE TO ESTIMATE 84*    Basic Metabolic Panel: Recent Labs  Lab 04/19/22 0507 04/20/22 0347 04/21/22 0430 04/22/22 0430 04/23/22 0441 04/24/22 0522 04/25/22 0430  NA 136 136 134* 128* 130* 129* 131*  K 3.7 4.7 3.9 3.4* 4.2 4.4 4.6  CL 106 108 102 96* 98 99 101  CO2 '22 23 23 26 27 26 25  '$ GLUCOSE 127* 101* 96 111* 102* 117* 103*  BUN '16 20 20 19 20 '$ 25* 30*  CREATININE 0.72 0.70 0.63 0.70 0.77 0.84 1.06*  CALCIUM 8.0* 7.9* 8.1* 8.0* 8.1* 7.6* 7.8*  MG 1.9 1.8 1.5* 1.6*  --  2.2  --   PHOS 2.6 2.8 3.6  --   --   --   --     GFR: Estimated Creatinine Clearance: 35.1 mL/min (A) (by C-G formula based on SCr of 1.06 mg/dL (H)). Liver Function Tests: Recent Labs  Lab 04/19/22 0507 04/20/22 0347 04/21/22 0430 04/22/22 0430  AST 33 32 37 43*  ALT '15 16 18 18  '$ ALKPHOS 56 54 60 71  BILITOT 2.2* 2.7* 2.8* 2.6*  PROT 5.6* 5.2* 4.9* 5.0*  ALBUMIN 3.5 3.1* 2.7* 2.6*    No results for input(s): LIPASE, AMYLASE in the last 168 hours. No results for input(s): AMMONIA in the last 168 hours. Coagulation Profile: No results for input(s): INR, PROTIME in the last 168 hours. Cardiac Enzymes: No results for input(s): CKTOTAL, CKMB, CKMBINDEX, TROPONINI in the  last 168 hours. BNP (last 3  results) No results for input(s): PROBNP in the last 8760 hours. HbA1C: No results for input(s): HGBA1C in the last 72 hours. CBG: No results for input(s): GLUCAP in the last 168 hours. Lipid Profile: No results for input(s): CHOL, HDL, LDLCALC, TRIG, CHOLHDL, LDLDIRECT in the last 72 hours. Thyroid Function Tests: No results for input(s): TSH, T4TOTAL, FREET4, T3FREE, THYROIDAB in the last 72 hours. Anemia Panel: Recent Labs    04/24/22 0522  FERRITIN 98  TIBC 181*  IRON 14*    Sepsis Labs: No results for input(s): PROCALCITON, LATICACIDVEN in the last 168 hours.  Recent Results (from the past 240 hour(s))  Body fluid culture w Gram Stain     Status: None   Collection Time: 04/16/22  8:38 AM   Specimen: Pleura; Body Fluid  Result Value Ref Range Status   Specimen Description   Final    PLEURAL LT Performed at [facility] [address] [place], [place] [address]    Special Requests   Final    Normal Performed at [facility], [place] [address] [place], [place] [address]    Gram Stain   Final    WBC PRESENT, PREDOMINANTLY PMN NO ORGANISMS SEEN CYTOSPIN SMEAR    Culture   Final    NO GROWTH 3 DAYS Performed at [facility], [place] [address], [place], [place] [address]    Report Status 04/19/2022 FINAL  Final  Resp Panel by RT-PCR (Flu A&B, Covid) Nasopharyngeal Swab     Status: None   Collection Time: 04/18/22  9:52 AM   Specimen: Nasopharyngeal Swab; Nasopharyngeal(NP) swabs in vial transport medium  Result Value Ref [place] Status   SARS Coronavirus 2 by RT PCR NEGATIVE NEGATIVE Final    Comment: (NOTE) SARS-CoV-2 target nucleic acids are NOT DETECTED.  The SARS-CoV-2 RNA is generally detectable in upper respiratory specimens during the acute phase of infection. The lowest concentration of SARS-CoV-2 viral copies this assay can detect is 138 copies/mL. A negative result does not preclude  SARS-Cov-2 infection and should not be used as the sole basis for treatment or other patient management decisions. A negative result may occur with  improper specimen collection/handling, submission of specimen other than nasopharyngeal swab, presence of viral mutation(s) within the areas targeted by this assay, and inadequate number of viral copies(<138 copies/mL). A negative result must be combined with clinical observations, patient history, and epidemiological information. The expected result is Negative.  Fact Sheet for Patients:  EntrepreneurPulse.com.au  Fact Sheet for Healthcare Providers:  IncredibleEmployment.be  This test is no t yet approved or cleared by the [place] FDA and  has been authorized for detection and/or diagnosis of SARS-CoV-2 by FDA under an Emergency Use Authorization (EUA). This EUA will remain  in effect (meaning this test can be used) for the duration of the COVID-19 declaration under Section 564(b)(1) of the Act, 21 U.S.C.section 360bbb-3(b)(1), unless the authorization is terminated  or revoked sooner.       Influenza A by PCR NEGATIVE NEGATIVE Final   Influenza B by PCR NEGATIVE NEGATIVE Final    Comment: (NOTE) The Xpert Xpress SARS-CoV-2/FLU/RSV plus assay is intended as an aid in the diagnosis of influenza from Nasopharyngeal swab specimens and should not be used as a sole basis for treatment. Nasal washings and aspirates are unacceptable for Xpert Xpress SARS-CoV-2/FLU/RSV testing.  Fact Sheet for Patients: EntrepreneurPulse.com.au  Fact Sheet for Healthcare Providers: IncredibleEmployment.be  This test is not yet approved or cleared  by the [place] and has been authorized for detection and/or diagnosis of SARS-CoV-2 by FDA under an Emergency Use Authorization (EUA). This EUA will remain in effect (meaning this test can be used) for the duration of  the COVID-19 declaration under Section 564(b)(1) of the Act, 21 U.S.C. section 360bbb-3(b)(1), unless the authorization is terminated or revoked.  Performed at [facility], [place] [address], [place], [place] [address]   MRSA Next Gen by PCR, Nasal     Status: None   Collection Time: 04/19/22  2:23 PM   Specimen: Nasal Mucosa; Nasal Swab  Result Value Ref Range Status   MRSA by PCR Next Gen NOT DETECTED NOT DETECTED Final    Comment: (NOTE) The GeneXpert MRSA Assay (FDA approved for NASAL specimens only), is one component of a comprehensive MRSA colonization surveillance program. It is not intended to diagnose MRSA infection nor to guide or monitor treatment for MRSA infections. Test performance is not FDA approved in patients less than 55 years old. Performed at [facility], [place] [address], [place], [place] [address]   Culture, body fluid w Gram Stain-bottle     Status: None   Collection Time: 04/19/22  2:23 PM   Specimen: Pleura  Result Value Ref [place] Status   Specimen Description PLEURAL  Final   Special Requests NONE  Final   Culture   Final    NO GROWTH 5 DAYS Performed at [facility], [address], [place], [place] [address]    Report Status 04/24/2022 FINAL  Final  Gram stain     Status: None   Collection Time: 04/19/22  2:23 PM   Specimen: Pleura  Result Value Ref [place] Status   Specimen Description PLEURAL  Final   Special Requests NONE  Final   Gram Stain   Final    WBC PRESENT,BOTH PMN AND MONONUCLEAR NO ORGANISMS SEEN CYTOSPIN SMEAR Performed at [facility], [address], [place], [place] [address]    Report Status 04/20/2022 FINAL  Final  Respiratory (~20 pathogens) panel by PCR     Status: None   Collection Time: 04/19/22  2:59 PM  Result Value Ref [place] Status   Adenovirus NOT DETECTED NOT DETECTED Final   Coronavirus 229E NOT DETECTED NOT DETECTED Final    Comment: (NOTE) The Coronavirus on the  Respiratory Panel, DOES NOT test for the novel  Coronavirus (2019 nCoV)    Coronavirus HKU1 NOT DETECTED NOT DETECTED Final   Coronavirus NL63 NOT DETECTED NOT DETECTED Final   Coronavirus OC43 NOT DETECTED NOT DETECTED Final   Metapneumovirus NOT DETECTED NOT DETECTED Final   Rhinovirus / Enterovirus NOT DETECTED NOT DETECTED Final   Influenza A NOT DETECTED NOT DETECTED Final   Influenza B NOT DETECTED NOT DETECTED Final   Parainfluenza Virus 1 NOT DETECTED NOT DETECTED Final   Parainfluenza Virus 2 NOT DETECTED NOT DETECTED Final   Parainfluenza Virus 3 NOT DETECTED NOT DETECTED Final   Parainfluenza Virus 4 NOT DETECTED NOT DETECTED Final   Respiratory Syncytial Virus NOT DETECTED NOT DETECTED Final   Bordetella pertussis NOT DETECTED NOT DETECTED Final   Bordetella Parapertussis NOT DETECTED NOT DETECTED Final   Chlamydophila pneumoniae NOT DETECTED NOT DETECTED Final   Mycoplasma pneumoniae NOT DETECTED NOT DETECTED Final    Comment: Performed at [facility], [address] [place], [place] [address]  Culture, blood (Routine X 2) w Reflex to ID Panel  Status: None   Collection Time: 04/19/22  3:35 PM   Specimen: BLOOD  Result Value Ref [place] Status   Specimen Description   Final    BLOOD BLOOD LEFT FOREARM Performed at [facility] [address] [place], [place] [address]    Special Requests   Final    BOTTLES DRAWN AEROBIC AND ANAEROBIC Blood Culture adequate volume Performed at [facility] [address] [place], [place] [address]    Culture   Final    NO GROWTH 5 DAYS Performed at [facility], [place] [address], [place], [place] [address]    Report Status 04/24/2022 FINAL  Final  Culture, blood (Routine X 2) w Reflex to ID Panel     Status: None   Collection Time: 04/19/22  3:35 PM   Specimen: BLOOD  Result Value Ref [place] Status   Specimen Description   Final    BLOOD LEFT ANTECUBITAL Performed at [facility] [address], [place], [place] [address]    Special Requests   Final    BOTTLES DRAWN AEROBIC AND ANAEROBIC Blood Culture results may not be optimal due to an inadequate volume of blood received in culture bottles Performed at [facility] [address] [place],  [address]    Culture   Final    NO GROWTH 5 DAYS Performed at [facility], [place] [address] [place],  [address]    Report Status 04/24/2022 FINAL  Final      Radiology Studies: No results found.  Scheduled Meds:  Chlorhexidine Gluconate Cloth  6 each Topical Daily   enoxaparin (LOVENOX) injection  30 mg Subcutaneous Q24H   feeding supplement (KATE [employer]ARMS STANDARD 1.4)  325 mL Oral Daily   lidocaine  1 patch Transdermal Q24H   magnesium oxide  400 mg Oral BID   midodrine  15 mg Oral TID WC   multivitamin with minerals  1 tablet Oral Daily   nicotine  21 mg Transdermal Daily   pantoprazole  40 mg Oral Daily   sodium chloride flush  10-40 mL Intracatheter Q12H   sodium chloride flush  3 mL Intravenous Q12H   tamoxifen  20 mg Oral Daily   Continuous Infusions:   LOS: 10 days   Darliss Cheney, MD [facility]  04/25/2022, 1:48 PM   *Please note that this is a verbal dictation therefore any spelling or grammatical errors are due to the "[place]" system interpretation.  Please page via [place] and do not message via secure chat for urgent patient care matters. Secure chat can be used for non urgent patient care matters.  How to contact the [facility] Attending or Consulting provider [place] or covering provider during after hours [place], for this patient?  Check the care team in [facility] and look for a) attending/consulting TRH provider listed and b) the [facility] team listed. Page or secure chat [phone]. Log into [email or web address] and use [place]'s universal password to access. If you do not have the password, please contact the hospital operator. Locate  the [facility] provider you are looking for under [facility] and page to a number that you can be directly reached. If you still have difficulty reaching the provider, please page the [facility] (Director on Call) for the Hospitalists listed on amion for assistance.

## 2022-04-26 DIAGNOSIS — J9621 Acute and chronic respiratory failure with hypoxia: Secondary | ICD-10-CM | POA: Diagnosis not present

## 2022-04-26 LAB — CBC WITH DIFFERENTIAL/PLATELET
Abs Immature Granulocytes: 0.15 10*3/uL — ABNORMAL HIGH (ref 0.00–0.07)
Abs Immature Granulocytes: 0.17 10*3/uL — ABNORMAL HIGH (ref 0.00–0.07)
Basophils Absolute: 0.1 10*3/uL (ref 0.0–0.1)
Basophils Absolute: 0.1 10*3/uL (ref 0.0–0.1)
Basophils Relative: 1 %
Basophils Relative: 1 %
Eosinophils Absolute: 0.1 10*3/uL (ref 0.0–0.5)
Eosinophils Absolute: 0.1 10*3/uL (ref 0.0–0.5)
Eosinophils Relative: 1 %
Eosinophils Relative: 1 %
HCT: 32.8 % — ABNORMAL LOW (ref 36.0–46.0)
HCT: 31.3 % — ABNORMAL LOW (ref 36.0–46.0)
Hemoglobin: 10.6 g/dL — ABNORMAL LOW (ref 12.0–15.0)
Hemoglobin: 10.4 g/dL — ABNORMAL LOW (ref 12.0–15.0)
Immature Granulocytes: 1 %
Immature Granulocytes: 1 %
Lymphocytes Relative: 8 %
Lymphocytes Relative: 7 %
Lymphs Abs: 1 10*3/uL (ref 0.7–4.0)
Lymphs Abs: 0.9 10*3/uL (ref 0.7–4.0)
MCH: 24.9 pg — ABNORMAL LOW (ref 26.0–34.0)
MCH: 25.5 pg — ABNORMAL LOW (ref 26.0–34.0)
MCHC: 32.3 g/dL (ref 30.0–36.0)
MCHC: 33.2 g/dL (ref 30.0–36.0)
MCV: 77.2 fL — ABNORMAL LOW (ref 80.0–100.0)
MCV: 76.7 fL — ABNORMAL LOW (ref 80.0–100.0)
Monocytes Absolute: 1.7 10*3/uL — ABNORMAL HIGH (ref 0.1–1.0)
Monocytes Absolute: 1.7 10*3/uL — ABNORMAL HIGH (ref 0.1–1.0)
Monocytes Relative: 14 %
Monocytes Relative: 14 %
Neutro Abs: 9.7 10*3/uL — ABNORMAL HIGH (ref 1.7–7.7)
Neutro Abs: 9.5 10*3/uL — ABNORMAL HIGH (ref 1.7–7.7)
Neutrophils Relative %: 75 %
Neutrophils Relative %: 76 %
Platelets: 143 10*3/uL — ABNORMAL LOW (ref 150–400)
Platelets: ADEQUATE 10*3/uL (ref 150–400)
RBC: 4.25 MIL/uL (ref 3.87–5.11)
RBC: 4.08 MIL/uL (ref 3.87–5.11)
RDW: 23.3 % — ABNORMAL HIGH (ref 11.5–15.5)
RDW: 23.3 % — ABNORMAL HIGH (ref 11.5–15.5)
WBC: 12.8 10*3/uL — ABNORMAL HIGH (ref 4.0–10.5)
WBC: 12.5 10*3/uL — ABNORMAL HIGH (ref 4.0–10.5)
nRBC: 0 % (ref 0.0–0.2)
nRBC: 0 % (ref 0.0–0.2)

## 2022-04-26 LAB — BASIC METABOLIC PANEL
Anion gap: 6 (ref 5–15)
Anion gap: 6 (ref 5–15)
BUN: 41 mg/dL — ABNORMAL HIGH (ref 6–20)
BUN: 43 mg/dL — ABNORMAL HIGH (ref 6–20)
CO2: 22 mmol/L (ref 22–32)
CO2: 24 mmol/L (ref 22–32)
Calcium: 7.9 mg/dL — ABNORMAL LOW (ref 8.9–10.3)
Calcium: 7.9 mg/dL — ABNORMAL LOW (ref 8.9–10.3)
Chloride: 97 mmol/L — ABNORMAL LOW (ref 98–111)
Chloride: 96 mmol/L — ABNORMAL LOW (ref 98–111)
Creatinine, Ser: 1.55 mg/dL — ABNORMAL HIGH (ref 0.44–1.00)
Creatinine, Ser: 1.66 mg/dL — ABNORMAL HIGH (ref 0.44–1.00)
GFR, Estimated: 39 mL/min — ABNORMAL LOW (ref >60)
GFR, Estimated: 36 mL/min — ABNORMAL LOW (ref >60)
Glucose, Bld: 125 mg/dL — ABNORMAL HIGH (ref 70–99)
Glucose, Bld: 124 mg/dL — ABNORMAL HIGH (ref 70–99)
Potassium: 5.6 mmol/L — ABNORMAL HIGH (ref 3.5–5.1)
Potassium: 5.5 mmol/L — ABNORMAL HIGH (ref 3.5–5.1)
Sodium: 125 mmol/L — ABNORMAL LOW (ref 135–145)
Sodium: 126 mmol/L — ABNORMAL LOW (ref 135–145)

## 2022-04-26 LAB — POTASSIUM: Potassium: 5 mmol/L (ref 3.5–5.1)

## 2022-04-26 MED ORDER — FUROSEMIDE 10 MG/ML IJ SOLN
40.0000 mg | Freq: Every day | INTRAMUSCULAR | Status: DC
Start: 2022-04-26 — End: 2022-04-26
  Administered 2022-04-26: 40 mg via INTRAVENOUS
  Filled 2022-04-26: qty 4

## 2022-04-26 MED ORDER — SODIUM ZIRCONIUM CYCLOSILICATE 10 G PO PACK
10.0000 g | PACK | Freq: Once | ORAL | Status: AC
  Administered 2022-04-26: 10 g via ORAL
  Filled 2022-04-26: qty 1

## 2022-04-26 MED ORDER — MIDODRINE HCL 5 MG PO TABS: 15.0000 mg | ORAL_TABLET | Freq: Three times a day (TID) | ORAL | 0 refills | Status: DC

## 2022-04-26 MED ORDER — ALBUMIN HUMAN 25 % IV SOLN
12.5000 g | Freq: Four times a day (QID) | INTRAVENOUS | Status: AC
  Administered 2022-04-26 (×2): 12.5 g via INTRAVENOUS
  Filled 2022-04-26 (×2): qty 50

## 2022-04-26 NOTE — Progress Notes (Signed)
[place] closed drainage system was changed per protocol,connected to continous suction at  20 cmH2O patient tolerated well Output documented.

## 2022-04-26 NOTE — Progress Notes (Signed)
PROGRESS NOTE    Theresa Rocha  RSW:[id number] DOB: [birth year] DOA: 04/14/2022 PCP: Seward Carol, MD   Brief Narrative:  Theresa Rocha 56 y.o. female with history of right-sided breast cancer, hepatocellular carcinoma, cirrhosis, and recurrent left pleural effusions who was transferred from [facility] for management of her recurrent effusion. She states that in October she began experiencing some shortness of breath and was diagnosed with a left pleural effusion.  This came shortly after completion of her ablation therapy to her liver for her [place].  She has had multiple thoracentesis, and also had a Pleurx catheter placed.  Na continues to be an issue.  Assessment & Plan:   Principal Problem:   Acute on chronic respiratory failure with hypoxia (HCC) Active Problems:   Hyponatremia   SIRS (systemic inflammatory response syndrome) (HCC)   Hyperkalemia   Decompensated HCV cirrhosis (HCC)   Pleural effusion on left   Hypotension   Hepatocellular carcinoma (HCC)   Thrombocytopenia (HCC)   Anemia   Breast cancer (HCC)   Protein-calorie malnutrition, severe (HCC)  Acute on chronic respiratory failure with hypoxia/left pleural effusion:  Pleural fluid transudative without growth.. Patient now comfortable on room air, output less than 1 L, cleared by CT surgery for discharge.  Discharge orders were placed to however after that, we received her BMP results back which now shows AKI, hyperkalemia and further hyponatremia.  Discussed with the patient at bedside again, has been present.  I explained to her that it would be very dangerous for her to go home with current labs, she understands and agrees with staying in the hospital and allowing [place] to manage her acute issues.  He had told me this morning that Dr. Kipp Brood had advised resuming Lasix, per that conversation, Lasix was resumed and patient received IV Lasix as well before BMP was resulted.  Now discontinuing Lasix due to rising  creatinine.  Decompensated HCV cirrhosis (HCC) Elevated hepatitis C and alcohol use disorder.  History of esophageal varices portal gastropathy.  Has multiple hepatocellular cancer status post microwave ablation 08/2021, s/p [id number] radioembolization to R lobe of liver on 1/24.  Follows up with Roosevelt Locks NP at [place] and [facility] seen by GI during hospitalization as well.   Hyponatremia Nephrology: last seen 5/21: Per their note, "hyponatremia - in a pt w/ cirrhosis and portal HTN. Treatment of hyponatremia in cirrhotics is unique in that the main goal is to improve blood pressures, which should then improve renal perfusion and therefore water clearance. NS IVF's dc'd and started on midodrine 12.5 tid and IV albumin. Has completed albumin course. Would cont midodrine for now and can taper down the dose if needed to maintain MAP > 80 and Na > 128 if possible. Can keep her on the midodrine at dc as well if there is a PCP to follow her.  No other suggestions, will sign off".  ACTH stimulation test normal.  TSH was normal.  -correct initially but now back down -lasix decreased due to urine Na < 10. Sodium dropped further to 125 today.  I am giving her another 2 doses of albumin today.   Hypokalemia: Resolved  Hyperkalemia: Suddenly her potassium is rising, 5.6 this morning.  We will give her a dose of Lokelma.  Recheck at 8 PM today and tomorrow morning.  AKI: Her renal function was stable for several days, BMP today showed sharp rise in creatinine from 1.06 yesterday to 1.55 today, we repeated labs and it once  again shows 1.66.  I think this AKI is likely secondary to ATN in the setting of hypotension due to third spacing and high output from her chest tube.  We will give her dose of IV albumin x2 today.  Repeat labs in the morning.  Discontinue Lasix and avoid any other nephrotoxic agents.   Hypomagnesemia: Resolved.  Hypotension Status post albumin.  Thought to be  secondary to cirrhosis of liver.  Still borderline low blood pressure. -Continue increased dose of midodrine 15 mg 3 times daily.   Anemia of chronic disease. Hemoglobin of 9.3 at this time.  Continue to monitor. -low Fe levels in past- check   Chronic thrombocytopenia ([place]): Secondary to alcoholism.  Stable.  No bleeding.  History of breast cancer ([place]) Follows with [name] oncology as outpatient.  Oncology following while in the hospital. -last seen 5/23:  plan to repeat her abdominal MRI after discharge for her [facility] follow up   Protein-calorie malnutrition, severe ([place]) Nutrition Status: Nutrition Problem: Severe Malnutrition Etiology: chronic illness (cancer, cirrhosis) Signs/Symptoms: severe muscle depletion, severe fat depletion Interventions: MVI, Other (Comment), Liberalize Diet    DVT prophylaxis: enoxaparin (LOVENOX) injection 30 mg Start: 04/14/22 2230   Code Status: Full Code  Family Communication: Husband present at bedside.  Plan of care discussed with patient in length and he/she verbalized understanding and agreed with it.  Status is: Inpatient Remains inpatient appropriate because: Still has chest tube.   Estimated body mass index is 14.37 kg/m as calculated from the following:   Height as of this encounter: '5\' 3"'$  (1.6 m).   Weight as of this encounter: 36.8 kg.    Nutritional Assessment: Body mass index is 14.37 kg/m.Marland Kitchen Seen by dietician.  I agree with the assessment and plan as outlined below: Nutrition Status: Nutrition Problem: Severe Malnutrition Etiology: chronic illness (cancer, cirrhosis) Signs/Symptoms: severe muscle depletion, severe fat depletion Interventions: MVI, Other (Comment), Liberalize Diet  . Skin Assessment: I have examined the patient's skin and I agree with the wound assessment as performed by the wound care RN as outlined below:    Consultants:  CTS  Procedures:  Chest tube  Antimicrobials:  Anti-infectives (From  admission, onward)    Start     Dose/Rate Route Frequency Ordered Stop   04/20/22 1600  vancomycin (VANCOCIN) IVPB 1000 mg/200 mL premix  Status:  Discontinued        1,000 mg 200 mL/hr over 60 Minutes Intravenous Every 24 hours 04/19/22 1444 04/19/22 1544   04/20/22 1400  vancomycin (VANCOREADY) IVPB 500 mg/100 mL  Status:  Discontinued        500 mg 100 mL/hr over 60 Minutes Intravenous Every 24 hours 04/19/22 1604 04/20/22 1145   04/20/22 0845  fluconazole (DIFLUCAN) tablet 150 mg        150 mg Oral  Once 04/20/22 0747 04/20/22 0941   04/19/22 1645  vancomycin (VANCOREADY) IVPB 750 mg/150 mL  Status:  Discontinued        750 mg 150 mL/hr over 60 Minutes Intravenous  Once 04/19/22 1552 04/19/22 1604   04/19/22 1645  vancomycin (VANCOREADY) IVPB 750 mg/150 mL        750 mg 150 mL/hr over 60 Minutes Intravenous  Once 04/19/22 1605 04/19/22 1753   04/19/22 1600  ceFEPIme (MAXIPIME) 2 g in sodium chloride 0.9 % 100 mL IVPB  Status:  Discontinued        2 g 200 mL/hr over 30 Minutes Intravenous Every 8 hours 04/19/22 1441 04/19/22  1545   04/19/22 1600  ceFEPIme (MAXIPIME) 2 g in sodium chloride 0.9 % 100 mL IVPB  Status:  Discontinued        2 g 200 mL/hr over 30 Minutes Intravenous Every 12 hours 04/19/22 1545 04/25/22 1343   04/19/22 1530  vancomycin (VANCOCIN) IVPB 1000 mg/200 mL premix  Status:  Discontinued        1,000 mg 200 mL/hr over 60 Minutes Intravenous  Once 04/19/22 1441 04/19/22 1544   04/16/22 1800  Ampicillin-Sulbactam (UNASYN) 3 g in sodium chloride 0.9 % 100 mL IVPB  Status:  Discontinued        3 g 200 mL/hr over 30 Minutes Intravenous Every 6 hours 04/16/22 1139 04/19/22 1421   04/16/22 1000  Ampicillin-Sulbactam (UNASYN) 3 g in sodium chloride 0.9 % 100 mL IVPB  Status:  Discontinued        3 g 200 mL/hr over 30 Minutes Intravenous Every 6 hours 04/16/22 0935 04/16/22 1139         Subjective:  Seen and examined.  Patient has no complaints.  Husband present at  bedside during my second visit.  Objective: Vitals:   04/26/22 0337 04/26/22 0339 04/26/22 0725 04/26/22 1114  BP: (!) 84/54 (!) 87/55 (!) 85/60 92/60  Pulse: 88 93 80 78  Resp: '16 19 18 18  '$ Temp: 97.8 F (36.6 C)  97.7 F (36.5 C) 98.7 F (37.1 C)  TempSrc: Oral  Oral Oral  SpO2: 96% 95% 96% 97%  Weight: 36.8 kg     Height:        Intake/Output Summary (Last 24 hours) at 04/26/2022 1335 Last data filed at 04/26/2022 0981 Gross per 24 hour  Intake --  Output 1050 ml  Net -1050 ml    Filed Weights   04/24/22 0523 04/25/22 0523 04/26/22 0337  Weight: 37.7 kg 37.1 kg 36.8 kg    Examination:  General exam: Appears calm and comfortable, cachectic Respiratory system: Clear to auscultation. Respiratory effort normal. Cardiovascular system: S1 & S2 heard, RRR. No JVD, murmurs, rubs, gallops or clicks. No pedal edema. Gastrointestinal system: Abdomen is nondistended, soft and nontender. No organomegaly or masses felt. Normal bowel sounds heard. Central nervous system: Alert and oriented. No focal neurological deficits. Extremities: Symmetric 5 x 5 power. Skin: No rashes, lesions or ulcers.  Psychiatry: Judgement and insight appear normal. Mood & affect appropriate.     Data Reviewed: I have personally reviewed following labs and imaging studies  CBC: Recent Labs  Lab 04/20/22 0347 04/21/22 0430 04/22/22 0430 04/26/22 1048 04/26/22 1200  WBC 9.0 8.5 6.4 12.8* 12.5*  NEUTROABS 7.2 6.5  --  9.7* 9.5*  HGB 9.4* 9.3* 9.2* 10.6* 10.4*  HCT 30.4* 28.9* 28.3* 32.8* 31.3*  MCV 79.4* 77.5* 76.3* 77.2* 76.7*  PLT 80* PLATELET CLUMPS NOTED ON SMEAR, UNABLE TO ESTIMATE 84* 143* PLATELET CLUMPS NOTED ON SMEAR, COUNT APPEARS ADEQUATE    Basic Metabolic Panel: Recent Labs  Lab 04/20/22 0347 04/21/22 0430 04/22/22 0430 04/23/22 0441 04/24/22 0522 04/25/22 0430 04/26/22 1048 04/26/22 1201  NA 136 134* 128* 130* 129* 131* 125* 126*  K 4.7 3.9 3.4* 4.2 4.4 4.6 5.6* 5.5*  CL  108 102 96* 98 99 101 97* 96*  CO2 '23 23 26 27 26 25 22 24  '$ GLUCOSE 101* 96 111* 102* 117* 103* 125* 124*  BUN '20 20 19 20 '$ 25* 30* 41* 43*  CREATININE 0.70 0.63 0.70 0.77 0.84 1.06* 1.55* 1.66*  CALCIUM 7.9* 8.1* 8.0*  8.1* 7.6* 7.8* 7.9* 7.9*  MG 1.8 1.5* 1.6*  --  2.2  --   --   --   PHOS 2.8 3.6  --   --   --   --   --   --     GFR: Estimated Creatinine Clearance: 22.2 mL/min (A) (by C-G formula based on SCr of 1.66 mg/dL (H)). Liver Function Tests: Recent Labs  Lab 04/20/22 0347 04/21/22 0430 04/22/22 0430  AST 32 37 43*  ALT '16 18 18  '$ ALKPHOS 54 60 71  BILITOT 2.7* 2.8* 2.6*  PROT 5.2* 4.9* 5.0*  ALBUMIN 3.1* 2.7* 2.6*    No results for input(s): LIPASE, AMYLASE in the last 168 hours. No results for input(s): AMMONIA in the last 168 hours. Coagulation Profile: No results for input(s): INR, PROTIME in the last 168 hours. Cardiac Enzymes: No results for input(s): CKTOTAL, CKMB, CKMBINDEX, TROPONINI in the last 168 hours. BNP (last 3 results) No results for input(s): PROBNP in the last 8760 hours. HbA1C: No results for input(s): HGBA1C in the last 72 hours. CBG: No results for input(s): GLUCAP in the last 168 hours. Lipid Profile: No results for input(s): CHOL, HDL, LDLCALC, TRIG, CHOLHDL, LDLDIRECT in the last 72 hours. Thyroid Function Tests: No results for input(s): TSH, T4TOTAL, FREET4, T3FREE, THYROIDAB in the last 72 hours. Anemia Panel: Recent Labs    04/24/22 0522  FERRITIN 98  TIBC 181*  IRON 14*    Sepsis Labs: No results for input(s): PROCALCITON, LATICACIDVEN in the last 168 hours.  Recent Results (from the past 240 hour(s))  Resp Panel by RT-PCR (Flu A&B, Covid) Nasopharyngeal Swab     Status: None   Collection Time: 04/18/22  9:52 AM   Specimen: Nasopharyngeal Swab; Nasopharyngeal(NP) swabs in vial transport medium  Result Value Ref Range Status   SARS Coronavirus 2 by RT PCR NEGATIVE NEGATIVE Final    Comment: (NOTE) SARS-CoV-2 target  nucleic acids are NOT DETECTED.  The SARS-CoV-2 RNA is generally detectable in upper respiratory specimens during the acute phase of infection. The lowest concentration of SARS-CoV-2 viral copies this assay can detect is 138 copies/mL. A negative result does not preclude SARS-Cov-2 infection and should not be used as the sole basis for treatment or other patient management decisions. A negative result may occur with  improper specimen collection/handling, submission of specimen other than nasopharyngeal swab, presence of viral mutation(s) within the areas targeted by this assay, and inadequate number of viral copies(<138 copies/mL). A negative result must be combined with clinical observations, patient history, and epidemiological information. The expected result is Negative.  Fact Sheet for Patients:  EntrepreneurPulse.com.au  Fact Sheet for Healthcare Providers:  IncredibleEmployment.be  This test is no t yet approved or cleared by the [place] FDA and  has been authorized for detection and/or diagnosis of SARS-CoV-2 by FDA under an Emergency Use Authorization (EUA). This EUA will remain  in effect (meaning this test can be used) for the duration of the COVID-19 declaration under Section 564(b)(1) of the Act, 21 U.S.C.section 360bbb-3(b)(1), unless the authorization is terminated  or revoked sooner.       Influenza A by PCR NEGATIVE NEGATIVE Final   Influenza B by PCR NEGATIVE NEGATIVE Final    Comment: (NOTE) The Xpert Xpress SARS-CoV-2/FLU/RSV plus assay is intended as an aid in the diagnosis of influenza from Nasopharyngeal swab specimens and should not be used as a sole basis for treatment. Nasal washings and aspirates are unacceptable for Xpert Xpress  SARS-CoV-2/FLU/RSV testing.  Fact Sheet for Patients: EntrepreneurPulse.com.au  Fact Sheet for Healthcare  Providers: IncredibleEmployment.be  This test is not yet approved or cleared by the [place] FDA and has been authorized for detection and/or diagnosis of SARS-CoV-2 by FDA under an Emergency Use Authorization (EUA). This EUA will remain in effect (meaning this test can be used) for the duration of the COVID-19 declaration under Section 564(b)(1) of the Act, 21 U.S.C. section 360bbb-3(b)(1), unless the authorization is terminated or revoked.  Performed at [facility], [place] [address], [place], [place] [address]   MRSA Next Gen by PCR, Nasal     Status: None   Collection Time: 04/19/22  2:23 PM   Specimen: Nasal Mucosa; Nasal Swab  Result Value Ref Range Status   MRSA by PCR Next Gen NOT DETECTED NOT DETECTED Final    Comment: (NOTE) The GeneXpert MRSA Assay (FDA approved for NASAL specimens only), is one component of a comprehensive MRSA colonization surveillance program. It is not intended to diagnose MRSA infection nor to guide or monitor treatment for MRSA infections. Test performance is not FDA approved in patients less than 61 years old. Performed at [facility], [place] [address], [place], [place] [address]   Culture, body fluid w Gram Stain-bottle     Status: None   Collection Time: 04/19/22  2:23 PM   Specimen: Pleura  Result Value Ref Range Status   Specimen Description PLEURAL  Final   Special Requests NONE  Final   Culture   Final    NO GROWTH 5 DAYS Performed at [facility], [address] [place], [place] [address]    Report Status 04/24/2022 FINAL  Final  Gram stain     Status: None   Collection Time: 04/19/22  2:23 PM   Specimen: Pleura  Result Value Ref Range Status   Specimen Description PLEURAL  Final   Special Requests NONE  Final   Gram Stain   Final    WBC PRESENT,BOTH PMN AND MONONUCLEAR NO ORGANISMS SEEN CYTOSPIN SMEAR Performed at [facility], [address],  [place], [place] [address]    Report Status 04/20/2022 FINAL  Final  Respiratory (~20 pathogens) panel by PCR     Status: None   Collection Time: 04/19/22  2:59 PM  Result Value Ref Range Status   Adenovirus NOT DETECTED NOT DETECTED Final   Coronavirus 229E NOT DETECTED NOT DETECTED Final    Comment: (NOTE) The Coronavirus on the Respiratory Panel, DOES NOT test for the novel  Coronavirus (2019 nCoV)    Coronavirus HKU1 NOT DETECTED NOT DETECTED Final   Coronavirus NL63 NOT DETECTED NOT DETECTED Final   Coronavirus OC43 NOT DETECTED NOT DETECTED Final   Metapneumovirus NOT DETECTED NOT DETECTED Final   Rhinovirus / Enterovirus NOT DETECTED NOT DETECTED Final   Influenza A NOT DETECTED NOT DETECTED Final   Influenza B NOT DETECTED NOT DETECTED Final   Parainfluenza Virus 1 NOT DETECTED NOT DETECTED Final   Parainfluenza Virus 2 NOT DETECTED NOT DETECTED Final   Parainfluenza Virus 3 NOT DETECTED NOT DETECTED Final   Parainfluenza Virus 4 NOT DETECTED NOT DETECTED Final   Respiratory Syncytial Virus NOT DETECTED NOT DETECTED Final   Bordetella pertussis NOT DETECTED NOT DETECTED Final   Bordetella Parapertussis NOT DETECTED NOT DETECTED Final   Chlamydophila pneumoniae NOT DETECTED NOT DETECTED Final   Mycoplasma pneumoniae NOT DETECTED NOT DETECTED Final    Comment: Performed at [facility], [place] [address] [place], [place] [address]  Culture, blood (Routine X 2) w Reflex to ID Panel     Status: None   Collection Time: 04/19/22  3:35 PM   Specimen: BLOOD  Result Value Ref Range Status   Specimen Description   Final    BLOOD BLOOD LEFT FOREARM Performed at [place] [address], [place], [place] [address]    Special Requests   Final    BOTTLES DRAWN AEROBIC AND ANAEROBIC Blood Culture adequate volume Performed at [place] [address], [place], [place] [address]    Culture   Final    NO GROWTH 5 DAYS Performed at [facility], [place] [address] [place], [place] [address]    Report Status 04/24/2022 FINAL  Final  Culture, blood (Routine X 2) w Reflex to ID Panel     Status: None   Collection Time: 04/19/22  3:35 PM   Specimen: BLOOD  Result Value Ref Range Status   Specimen Description   Final    BLOOD LEFT ANTECUBITAL Performed at [place] [address] [place], [place] [address]    Special Requests   Final    BOTTLES DRAWN AEROBIC AND ANAEROBIC Blood Culture results may not be optimal due to an inadequate volume of blood received in culture bottles Performed at [place] [address], [place], [place] [address]    Culture   Final    NO GROWTH 5 DAYS Performed at [facility], [place] [address], [place],  [address]    Report Status 04/24/2022 FINAL  Final      Radiology Studies: No results found.  Scheduled Meds:  Chlorhexidine Gluconate Cloth  6 each Topical Daily   enoxaparin (LOVENOX) injection  30 mg Subcutaneous Q24H   feeding supplement (KATE [employer]ARMS STANDARD 1.4)  325 mL Oral Daily   furosemide  40 mg Intravenous Daily   lidocaine  1 patch Transdermal Q24H   magnesium oxide  400 mg Oral BID   midodrine  15 mg Oral TID WC   multivitamin with minerals  1 tablet Oral Daily   nicotine  21 mg Transdermal Daily   pantoprazole  40 mg Oral Daily   sodium chloride flush  10-40 mL Intracatheter Q12H   sodium chloride flush  3 mL Intravenous Q12H   sodium zirconium cyclosilicate  10 g Oral Once   tamoxifen  20 mg Oral Daily   Continuous Infusions:   LOS: 11 days   Darliss Cheney, MD [facility]  04/26/2022, 1:35 PM   *Please note that this is a verbal dictation therefore any spelling or grammatical errors are due to the "[place]" system interpretation.  Please page via [place] and do not message via secure chat for urgent patient care matters. Secure chat can be used for non urgent patient care  matters.  How to contact the [facility] Attending or Consulting provider [place] or covering provider during after hours [place], for this patient?  Check the care team in [facility] and look for a) attending/consulting TRH provider listed and b) the [facility] team listed. Page or secure chat [phone]. Log into [email or web address] and use [place]'s universal password to access. If you do not have the password, please contact the hospital operator. Locate the [facility] provider you are looking for under [facility] and page to a number that you can be directly reached. If you  still have difficulty reaching the provider, please page the [facility] (Director on Call) for the Hospitalists listed on amion for assistance.

## 2022-04-26 NOTE — Progress Notes (Signed)
     [address]       [place] [address]             [phone]       Discussed care with pt and husband Ok for discharge with BID drainage Will need home health Will f/u in 1 week  [place]

## 2022-04-26 NOTE — TOC Progression Note (Addendum)
Transition of Care [facility]) - Progression Note    Patient Details  Name: Theresa Rocha MRN: [id number] Date of Birth: 1966/05/05  Transition of [facility]) CM/SW Contact  Loletha Grayer Beverely Pace, RN Phone Number: 04/26/2022, 11:22 AM  Clinical Narrative:  Case manager spoke with patient concerning discharge plan and need for Home Health RN. Patient recently completed services with [facility]., she is agreeable to them being called with referral. CM called Lindie [facility]ruce Liaison. She will contact CM with start of care date. Patient states that is fine, her husband is home this week and they will be teaching their 20yrold daughter how to perform dressing changes. No further TOC needs identified.   11:48am:  [facility] doesn't have Nurse available to staff this. Case Manager [facility]. Waiting for return call. 12:30pm : [facility] does not have RN  availability to staff. CM calling referral to [facility] 1330pm : [facility] not staff . Case manager is going to update MD., Patient may need to f/u at her Md office.     Expected Discharge Plan: HW[place]fordBarriers to Discharge: No Barriers Identified  Expected Discharge Plan and Services Expected Discharge Plan: [place]  Discharge Planning Services: CM Consult Post Acute Care Choice: [place]antsvillearrangements for the past 2 months: Single Family Home Expected Discharge Date: 04/26/22                 DME Agency: NA       HH Arranged: RN HSubiacoAgency: BHarlanDate [facility] Contacted: 04/26/22 Time HH Agency Contacted: [phone] spoke with at HBagley CBementDeterminants of Health ([place] Interventions    Readmission Risk Interventions     View : No data to display.

## 2022-04-27 ENCOUNTER — Inpatient Hospital Stay (HOSPITAL_COMMUNITY): Payer: BC Managed Care – PPO

## 2022-04-27 DIAGNOSIS — J9621 Acute and chronic respiratory failure with hypoxia: Secondary | ICD-10-CM | POA: Diagnosis not present

## 2022-04-27 LAB — LACTIC ACID, PLASMA: Lactic Acid, Venous: 1.3 mmol/L (ref 0.5–1.9)

## 2022-04-27 LAB — CBC WITH DIFFERENTIAL/PLATELET
Abs Immature Granulocytes: 0.07 10*3/uL (ref 0.00–0.07)
Basophils Absolute: 0.1 10*3/uL (ref 0.0–0.1)
Basophils Relative: 1 %
Eosinophils Absolute: 0.2 10*3/uL (ref 0.0–0.5)
Eosinophils Relative: 2 %
HCT: 30.1 % — ABNORMAL LOW (ref 36.0–46.0)
Hemoglobin: 10 g/dL — ABNORMAL LOW (ref 12.0–15.0)
Immature Granulocytes: 1 %
Lymphocytes Relative: 9 %
Lymphs Abs: 0.9 10*3/uL (ref 0.7–4.0)
MCH: 25.1 pg — ABNORMAL LOW (ref 26.0–34.0)
MCHC: 33.2 g/dL (ref 30.0–36.0)
MCV: 75.6 fL — ABNORMAL LOW (ref 80.0–100.0)
Monocytes Absolute: 1.6 10*3/uL — ABNORMAL HIGH (ref 0.1–1.0)
Monocytes Relative: 16 %
Neutro Abs: 7.5 10*3/uL (ref 1.7–7.7)
Neutrophils Relative %: 71 %
Platelets: 118 10*3/uL — ABNORMAL LOW (ref 150–400)
RBC: 3.98 MIL/uL (ref 3.87–5.11)
RDW: 23.1 % — ABNORMAL HIGH (ref 11.5–15.5)
WBC: 10.4 10*3/uL (ref 4.0–10.5)
nRBC: 0 % (ref 0.0–0.2)

## 2022-04-27 LAB — BASIC METABOLIC PANEL
Anion gap: 7 (ref 5–15)
BUN: 48 mg/dL — ABNORMAL HIGH (ref 6–20)
CO2: 24 mmol/L (ref 22–32)
Calcium: 8.3 mg/dL — ABNORMAL LOW (ref 8.9–10.3)
Chloride: 98 mmol/L (ref 98–111)
Creatinine, Ser: 1.89 mg/dL — ABNORMAL HIGH (ref 0.44–1.00)
GFR, Estimated: 31 mL/min — ABNORMAL LOW (ref >60)
Glucose, Bld: 97 mg/dL (ref 70–99)
Potassium: 5 mmol/L (ref 3.5–5.1)
Sodium: 129 mmol/L — ABNORMAL LOW (ref 135–145)

## 2022-04-27 LAB — AMMONIA: Ammonia: 60 umol/L — ABNORMAL HIGH (ref 9–35)

## 2022-04-27 MED ORDER — ALBUMIN HUMAN 5 % IV SOLN
12.5000 g | Freq: Once | INTRAVENOUS | Status: AC
Start: 2022-04-27 — End: 2022-04-27
  Administered 2022-04-27: 12.5 g via INTRAVENOUS
  Filled 2022-04-27: qty 250

## 2022-04-27 MED ORDER — ALBUMIN HUMAN 25 % IV SOLN
25.0000 g | Freq: Once | INTRAVENOUS | Status: DC
Start: 2022-04-27 — End: 2022-04-27

## 2022-04-27 MED ORDER — SODIUM CHLORIDE 0.9 % IV BOLUS
250.0000 mL | Freq: Once | INTRAVENOUS | Status: AC
Start: 2022-04-27 — End: 2022-04-27
  Administered 2022-04-27: 250 mL via INTRAVENOUS

## 2022-04-27 MED ORDER — ALBUMIN HUMAN 5 % IV SOLN
12.5000 g | Freq: Four times a day (QID) | INTRAVENOUS | Status: AC
  Administered 2022-04-27 (×2): 12.5 g via INTRAVENOUS
  Filled 2022-04-27 (×2): qty 250

## 2022-04-27 NOTE — Progress Notes (Signed)
Pt has motivation to ambulate more in the hallway. Chest tube was under water-seal during the ambulation.  Her BP 86/59-95/62 mmHg after getting albumin today. HR 70s-80s, afebrile, SPO2 97% on room air.No SOB or dizziness.   04/27/22 2000  Mobility  HOB Elevated/Bed Position Self regulated  Activity Ambulated with assistance in hallway  Range of Motion/Exercises Active;All extremities  Level of Assistance Standby assist, set-up cues, supervision of patient - no hands on  Assistive Device Front wheel walker  Distance Ambulated (ft) 470 ft  Activity Response Tolerated well   Kennyth Lose, RN

## 2022-04-27 NOTE — Progress Notes (Signed)
Blood pressure has been soft 78/57- 84/52 mmHg, HR 70s-80s, afebrile, no respiratory distress. Pt received albumin 25% 12.5 g x 2 doses and increased Midodrine 15 mg TID on day shift.  Pt has poor appetite. Encouraged to drink more fluid and supplement [facility] Standard), but she refused. Pt reported had two loose stool yesterday.   Her chest tube total 24 hours 1000 ml( 500 ml on day shift and 500 ml on night shift) with serous drainage on -20 wall suction.   Dr. Hal Hope was notified. Order was received for more albumin and Lactic acid stat. We will continue to monitor.  Kennyth Lose, RN

## 2022-04-27 NOTE — Progress Notes (Signed)
PROGRESS NOTE    Theresa Rocha  NOB:[id number] DOB: 04/30/1966 DOA: 04/14/2022 PCP: Seward Carol, MD   Brief Narrative:  Theresa Rocha 56 y.o. female with history of right-sided breast cancer, hepatocellular carcinoma, cirrhosis, and recurrent left pleural effusions who was transferred from [facility] for management of her recurrent effusion. She states that in October she began experiencing some shortness of breath and was diagnosed with a left pleural effusion.  This came shortly after completion of her ablation therapy to her liver for her [place].  She has had multiple thoracentesis, and also had a Pleurx catheter placed.  Na continues to be an issue.  Assessment & Plan:   Principal Problem:   Acute on chronic respiratory failure with hypoxia (HCC) Active Problems:   Hyponatremia   SIRS (systemic inflammatory response syndrome) (HCC)   Hyperkalemia   Decompensated HCV cirrhosis (HCC)   Pleural effusion on left   Hypotension   Hepatocellular carcinoma (HCC)   Thrombocytopenia (HCC)   Anemia   Breast cancer (HCC)   Protein-calorie malnutrition, severe (HCC)  Acute on chronic respiratory failure with hypoxia/left pleural effusion:  Pleural fluid transudative without growth. Patient now comfortable on room air, output less than 1 L, cleared by CT surgery for discharge on 04/26/2022, discharge orders were placed to however after that, we received her BMP results back which now showed AKI, hyperkalemia and further hyponatremia.  Discussed with the patient at bedside again with husband present and explained the reason why we recommend her to stay in the hospital, they both agreed.   Decompensated HCV cirrhosis (HCC) Elevated hepatitis C and alcohol use disorder.  History of esophageal varices portal gastropathy.  Has multiple hepatocellular cancer status post microwave ablation 08/2021, s/p [id number] radioembolization to R lobe of liver on 1/24.  Follows up with Roosevelt Locks NP at [facility] and Tras[facility] seen by GI during hospitalization as well.   Hyponatremia Nephrology: last seen 5/21: Per their note, "hyponatremia - in a pt w/ cirrhosis and portal HTN. Treatment of hyponatremia in cirrhotics is unique in that the main goal is to improve blood pressures, which should then improve renal perfusion and therefore water clearance. NS IVF's dc'd and started on midodrine 12.5 tid and IV albumin. Has completed albumin course. Would cont midodrine for now and can taper down the dose if needed to maintain MAP > 80 and Na > 128 if possible. Can keep her on the midodrine at dc as well if there is a PCP to follow her.  No other suggestions, will sign off".  ACTH stimulation test normal.  TSH was normal.  -correct initially but then went down to 125 on 04/26/2022, now back to within her baseline range again.   Hypokalemia: Resolved  Hyperkalemia: Was high 5.6 on 04/26/2022, received [place].  Normal today.  AKI: Her renal function was stable for several days, BMP on 04/26/2022 showed sharp rise in creatinine from 1.06 yesterday to 1.66, we repeated. AKI is likely secondary to ATN in the setting of hypotension due to third spacing and high output from her chest tube.  We gave her 2 doses of albumin, her blood pressure remains intermittently low, she received another dose early morning.  Despite of this, creatinine slightly worse at 1.86 today.  I have reconsulted nephrology today and will defer management to them.   Hypomagnesemia: Resolved.  Hypotension Status post albumin.  Thought to be secondary to cirrhosis of liver.  Still borderline low blood pressure. -Continue increased  dose of midodrine 15 mg 3 times daily.   Anemia of chronic disease. Hemoglobin of 9.3 at this time.  Continue to monitor. -low Fe levels in past- check   Chronic thrombocytopenia ([place]): Secondary to alcoholism.  Stable.  No bleeding.  History of breast cancer ([place]) Follows with [name] oncology as  outpatient.  Oncology following while in the hospital. -last seen 5/23:  plan to repeat her abdominal MRI after discharge for her [facility] follow up   Protein-calorie malnutrition, severe ([place]) Nutrition Status: Nutrition Problem: Severe Malnutrition Etiology: chronic illness (cancer, cirrhosis) Signs/Symptoms: severe muscle depletion, severe fat depletion Interventions: MVI, Other (Comment), Liberalize Diet    DVT prophylaxis: enoxaparin (LOVENOX) injection 30 mg Start: 04/14/22 2230   Code Status: Full Code  Family Communication: None present at bedside.  Plan of care discussed with patient in length and he/she verbalized understanding and agreed with it.  Status is: Inpatient Remains inpatient appropriate because: Still has chest tube and now with worsening creatinine.   Estimated body mass index is 14.37 kg/m as calculated from the following:   Height as of this encounter: '5\' 3"'$  (1.6 m).   Weight as of this encounter: 36.8 kg.    Nutritional Assessment: Body mass index is 14.37 kg/m.Marland Kitchen Seen by dietician.  I agree with the assessment and plan as outlined below: Nutrition Status: Nutrition Problem: Severe Malnutrition Etiology: chronic illness (cancer, cirrhosis) Signs/Symptoms: severe muscle depletion, severe fat depletion Interventions: MVI, Other (Comment), Liberalize Diet  . Skin Assessment: I have examined the patient's skin and I agree with the wound assessment as performed by the wound care RN as outlined below:    Consultants:  CTS  Procedures:  Chest tube  Antimicrobials:  Anti-infectives (From admission, onward)    Start     Dose/Rate Route Frequency Ordered Stop   04/20/22 1600  vancomycin (VANCOCIN) IVPB 1000 mg/200 mL premix  Status:  Discontinued        1,000 mg 200 mL/hr over 60 Minutes Intravenous Every 24 hours 04/19/22 1444 04/19/22 1544   04/20/22 1400  vancomycin (VANCOREADY) IVPB 500 mg/100 mL  Status:  Discontinued        500 mg 100 mL/hr over  60 Minutes Intravenous Every 24 hours 04/19/22 1604 04/20/22 1145   04/20/22 0845  fluconazole (DIFLUCAN) tablet 150 mg        150 mg Oral  Once 04/20/22 0747 04/20/22 0941   04/19/22 1645  vancomycin (VANCOREADY) IVPB 750 mg/150 mL  Status:  Discontinued        750 mg 150 mL/hr over 60 Minutes Intravenous  Once 04/19/22 1552 04/19/22 1604   04/19/22 1645  vancomycin (VANCOREADY) IVPB 750 mg/150 mL        750 mg 150 mL/hr over 60 Minutes Intravenous  Once 04/19/22 1605 04/19/22 1753   04/19/22 1600  ceFEPIme (MAXIPIME) 2 g in sodium chloride 0.9 % 100 mL IVPB  Status:  Discontinued        2 g 200 mL/hr over 30 Minutes Intravenous Every 8 hours 04/19/22 1441 04/19/22 1545   04/19/22 1600  ceFEPIme (MAXIPIME) 2 g in sodium chloride 0.9 % 100 mL IVPB  Status:  Discontinued        2 g 200 mL/hr over 30 Minutes Intravenous Every 12 hours 04/19/22 1545 04/25/22 1343   04/19/22 1530  vancomycin (VANCOCIN) IVPB 1000 mg/200 mL premix  Status:  Discontinued        1,000 mg 200 mL/hr over 60 Minutes Intravenous  Once 04/19/22 1441 04/19/22 1544   04/16/22 1800  Ampicillin-Sulbactam (UNASYN) 3 g in sodium chloride 0.9 % 100 mL IVPB  Status:  Discontinued        3 g 200 mL/hr over 30 Minutes Intravenous Every 6 hours 04/16/22 1139 04/19/22 1421   04/16/22 1000  Ampicillin-Sulbactam (UNASYN) 3 g in sodium chloride 0.9 % 100 mL IVPB  Status:  Discontinued        3 g 200 mL/hr over 30 Minutes Intravenous Every 6 hours 04/16/22 0935 04/16/22 1139         Subjective:  Seen and examined.  Fully alert and oriented.  She has no complaints.  She understands that her kidney function is worsening and for that reason, she needs to stay here in the hospital so we can manage better.  She is agreeable with the plan.  Objective: Vitals:   04/27/22 0024 04/27/22 0433 04/27/22 0436 04/27/22 0730  BP: (!) 84/52  (!) 82/57 (!) 86/58  Pulse: 79  78 83  Resp: '18  19 18  '$ Temp:    98.1 F (36.7 C)  TempSrc:     Oral  SpO2: 93%  98% 98%  Weight:  36.8 kg    Height:        Intake/Output Summary (Last 24 hours) at 04/27/2022 1035 Last data filed at 04/27/2022 0830 Gross per 24 hour  Intake 720.79 ml  Output 1120 ml  Net -399.21 ml    Filed Weights   04/25/22 0523 04/26/22 0337 04/27/22 0433  Weight: 37.1 kg 36.8 kg 36.8 kg    Examination:  General exam: Appears calm and comfortable, thin/cachectic Respiratory system: Clear to auscultation. Respiratory effort normal. Cardiovascular system: S1 & S2 heard, RRR. No JVD, murmurs, rubs, gallops or clicks. No pedal edema. Gastrointestinal system: Abdomen is nondistended, soft and nontender. No organomegaly or masses felt. Normal bowel sounds heard. Central nervous system: Alert and oriented. No focal neurological deficits. Extremities: Symmetric 5 x 5 power. Skin: No rashes, lesions or ulcers.  Psychiatry: Judgement and insight appear normal. Mood & affect appropriate.    Data Reviewed: I have personally reviewed following labs and imaging studies  CBC: Recent Labs  Lab 04/21/22 0430 04/22/22 0430 04/26/22 1048 04/26/22 1200 04/27/22 0400  WBC 8.5 6.4 12.8* 12.5* 10.4  NEUTROABS 6.5  --  9.7* 9.5* 7.5  HGB 9.3* 9.2* 10.6* 10.4* 10.0*  HCT 28.9* 28.3* 32.8* 31.3* 30.1*  MCV 77.5* 76.3* 77.2* 76.7* 75.6*  PLT PLATELET CLUMPS NOTED ON SMEAR, UNABLE TO ESTIMATE 84* 143* PLATELET CLUMPS NOTED ON SMEAR, COUNT APPEARS ADEQUATE 118*    Basic Metabolic Panel: Recent Labs  Lab 04/21/22 0430 04/22/22 0430 04/23/22 0441 04/24/22 0522 04/25/22 0430 04/26/22 1048 04/26/22 1201 04/26/22 2000 04/27/22 0400  NA 134* 128*   < > 129* 131* 125* 126*  --  129*  K 3.9 3.4*   < > 4.4 4.6 5.6* 5.5* 5.0 5.0  CL 102 96*   < > 99 101 97* 96*  --  98  CO2 23 26   < > '26 25 22 24  '$ --  24  GLUCOSE 96 111*   < > 117* 103* 125* 124*  --  97  BUN 20 19   < > 25* 30* 41* 43*  --  48*  CREATININE 0.63 0.70   < > 0.84 1.06* 1.55* 1.66*  --  1.89*   CALCIUM 8.1* 8.0*   < > 7.6* 7.8* 7.9* 7.9*  --  8.3*  MG 1.5* 1.6*  --  2.2  --   --   --   --   --   PHOS 3.6  --   --   --   --   --   --   --   --    < > = values in this interval not displayed.    GFR: Estimated Creatinine Clearance: 19.5 mL/min (A) (by C-G formula based on SCr of 1.89 mg/dL (H)). Liver Function Tests: Recent Labs  Lab 04/21/22 0430 04/22/22 0430  AST 37 43*  ALT 18 18  ALKPHOS 60 71  BILITOT 2.8* 2.6*  PROT 4.9* 5.0*  ALBUMIN 2.7* 2.6*    No results for input(s): LIPASE, AMYLASE in the last 168 hours. Recent Labs  Lab 04/27/22 0718  AMMONIA 60*   Coagulation Profile: No results for input(s): INR, PROTIME in the last 168 hours. Cardiac Enzymes: No results for input(s): CKTOTAL, CKMB, CKMBINDEX, TROPONINI in the last 168 hours. BNP (last 3 results) No results for input(s): PROBNP in the last 8760 hours. HbA1C: No results for input(s): HGBA1C in the last 72 hours. CBG: No results for input(s): GLUCAP in the last 168 hours. Lipid Profile: No results for input(s): CHOL, HDL, LDLCALC, TRIG, CHOLHDL, LDLDIRECT in the last 72 hours. Thyroid Function Tests: No results for input(s): TSH, T4TOTAL, FREET4, T3FREE, THYROIDAB in the last 72 hours. Anemia Panel: No results for input(s): VITAMINB12, FOLATE, FERRITIN, TIBC, IRON, RETICCTPCT in the last 72 hours.  Sepsis Labs: Recent Labs  Lab 04/27/22 0400  LATICACIDVEN 1.3    Recent Results (from the past 240 hour(s))  Resp Panel by RT-PCR (Flu A&B, Covid) Nasopharyngeal Swab     Status: None   Collection Time: 04/18/22  9:52 AM   Specimen: Nasopharyngeal Swab; Nasopharyngeal(NP) swabs in vial transport medium  Result Value Ref Range Status   SARS Coronavirus 2 by RT PCR NEGATIVE NEGATIVE Final    Comment: (NOTE) SARS-CoV-2 target nucleic acids are NOT DETECTED.  The SARS-CoV-2 RNA is generally detectable in upper respiratory specimens during the acute phase of infection. The lowest concentration  of SARS-CoV-2 viral copies this assay can detect is 138 copies/mL. A negative result does not preclude SARS-Cov-2 infection and should not be used as the sole basis for treatment or other patient management decisions. A negative result may occur with  improper specimen collection/handling, submission of specimen other than nasopharyngeal swab, presence of viral mutation(s) within the areas targeted by this assay, and inadequate number of viral copies(<138 copies/mL). A negative result must be combined with clinical observations, patient history, and epidemiological information. The expected result is Negative.  Fact Sheet for Patients:  EntrepreneurPulse.com.au  Fact Sheet for Healthcare Providers:  IncredibleEmployment.be  This test is no t yet approved or cleared by the [place] FDA and  has been authorized for detection and/or diagnosis of SARS-CoV-2 by FDA under an Emergency Use Authorization (EUA). This EUA will remain  in effect (meaning this test can be used) for the duration of the COVID-19 declaration under Section 564(b)(1) of the Act, 21 U.S.C.section 360bbb-3(b)(1), unless the authorization is terminated  or revoked sooner.       Influenza A by PCR NEGATIVE NEGATIVE Final   Influenza B by PCR NEGATIVE NEGATIVE Final    Comment: (NOTE) The Xpert Xpress SARS-CoV-2/FLU/RSV plus assay is intended as an aid in the diagnosis of influenza from Nasopharyngeal swab specimens and should not be used as a sole basis for treatment. Nasal washings and aspirates  are unacceptable for Xpert Xpress SARS-CoV-2/FLU/RSV testing.  Fact Sheet for Patients: EntrepreneurPulse.com.au  Fact Sheet for Healthcare Providers: IncredibleEmployment.be  This test is not yet approved or cleared by the [place] FDA and has been authorized for detection and/or diagnosis of SARS-CoV-2 by FDA under an Emergency Use  Authorization (EUA). This EUA will remain in effect (meaning this test can be used) for the duration of the COVID-19 declaration under Section 564(b)(1) of the Act, 21 U.S.C. section 360bbb-3(b)(1), unless the authorization is terminated or revoked.  Performed at [facility], [place] [address] [place], [place] [address]   MRSA Next Gen by PCR, Nasal     Status: None   Collection Time: 04/19/22  2:23 PM   Specimen: Nasal Mucosa; Nasal Swab  Result Value Ref Range Status   MRSA by PCR Next Gen NOT DETECTED NOT DETECTED Final    Comment: (NOTE) The GeneXpert MRSA Assay (FDA approved for NASAL specimens only), is one component of a comprehensive MRSA colonization surveillance program. It is not intended to diagnose MRSA infection nor to guide or monitor treatment for MRSA infections. Test performance is not FDA approved in patients less than 14 years old. Performed at [facility], [place] [address] [place], [place] [address]   Culture, body fluid w Gram Stain-bottle     Status: None   Collection Time: 04/19/22  2:23 PM   Specimen: Pleura  Result Value Ref Range Status   Specimen Description PLEURAL  Final   Special Requests NONE  Final   Culture   Final    NO GROWTH 5 DAYS Performed at [facility], [address] [place], [place] [address]    Report Status 04/24/2022 FINAL  Final  Gram stain     Status: None   Collection Time: 04/19/22  2:23 PM   Specimen: Pleura  Result Value Ref Range Status   Specimen Description PLEURAL  Final   Special Requests NONE  Final   Gram Stain   Final    WBC PRESENT,BOTH PMN AND MONONUCLEAR NO ORGANISMS SEEN CYTOSPIN SMEAR Performed at [facility], [address] [place],  [address]    Report Status 04/20/2022 FINAL  Final  Respiratory (~20 pathogens) panel by PCR     Status: None   Collection Time: 04/19/22  2:59 PM  Result Value Ref Range Status   Adenovirus NOT DETECTED NOT  DETECTED Final   Coronavirus 229E NOT DETECTED NOT DETECTED Final    Comment: (NOTE) The Coronavirus on the Respiratory Panel, DOES NOT test for the novel  Coronavirus (2019 nCoV)    Coronavirus HKU1 NOT DETECTED NOT DETECTED Final   Coronavirus NL63 NOT DETECTED NOT DETECTED Final   Coronavirus OC43 NOT DETECTED NOT DETECTED Final   Metapneumovirus NOT DETECTED NOT DETECTED Final   Rhinovirus / Enterovirus NOT DETECTED NOT DETECTED Final   Influenza A NOT DETECTED NOT DETECTED Final   Influenza B NOT DETECTED NOT DETECTED Final   Parainfluenza Virus 1 NOT DETECTED NOT DETECTED Final   Parainfluenza Virus 2 NOT DETECTED NOT DETECTED Final   Parainfluenza Virus 3 NOT DETECTED NOT DETECTED Final   Parainfluenza Virus 4 NOT DETECTED NOT DETECTED Final   Respiratory Syncytial Virus NOT DETECTED NOT DETECTED Final   Bordetella pertussis NOT DETECTED NOT DETECTED Final   Bordetella Parapertussis NOT DETECTED NOT DETECTED Final   Chlamydophila pneumoniae NOT DETECTED NOT DETECTED Final   Mycoplasma pneumoniae NOT DETECTED NOT DETECTED Final  Comment: Performed at [facility], [place] [address] [place], [place] [address]  Culture, blood (Routine X 2) w Reflex to ID Panel     Status: None   Collection Time: 04/19/22  3:35 PM   Specimen: BLOOD  Result Value Ref Range Status   Specimen Description   Final    BLOOD BLOOD LEFT FOREARM Performed at [facility] [address], [place], [place] [address]    Special Requests   Final    BOTTLES DRAWN AEROBIC AND ANAEROBIC Blood Culture adequate volume Performed at [facility] [address] [place], [place] [address]    Culture   Final    NO GROWTH 5 DAYS Performed at [facility], [place] [address], [place], [place] [address]    Report Status 04/24/2022 FINAL  Final  Culture, blood (Routine X 2) w Reflex to ID Panel     Status: None   Collection Time: 04/19/22  3:35 PM   Specimen: BLOOD   Result Value Ref Range Status   Specimen Description   Final    BLOOD LEFT ANTECUBITAL Performed at [facility] [address], [place], [place] [address]    Special Requests   Final    BOTTLES DRAWN AEROBIC AND ANAEROBIC Blood Culture results may not be optimal due to an inadequate volume of blood received in culture bottles Performed at [facility] [address] [place], [place] [address]    Culture   Final    NO GROWTH 5 DAYS Performed at [facility], [place] [address], [place], [place] [address]    Report Status 04/24/2022 FINAL  Final      Radiology Studies: No results found.  Scheduled Meds:  Chlorhexidine Gluconate Cloth  6 each Topical Daily   enoxaparin (LOVENOX) injection  30 mg Subcutaneous Q24H   feeding supplement (KATE FARMS STANDARD 1.4)  325 mL Oral Daily   lidocaine  1 patch Transdermal Q24H   magnesium oxide  400 mg Oral BID   midodrine  15 mg Oral TID WC   multivitamin with minerals  1 tablet Oral Daily   nicotine  21 mg Transdermal Daily   pantoprazole  40 mg Oral Daily   sodium chloride flush  10-40 mL Intracatheter Q12H   sodium chloride flush  3 mL Intravenous Q12H   tamoxifen  20 mg Oral Daily   Continuous Infusions:   LOS: 12 days   Darliss Cheney, MD [facility]  04/27/2022, 10:35 AM   *Please note that this is a verbal dictation therefore any spelling or grammatical errors are due to the "[employer]" system interpretation.  Please page via [place] and do not message via secure chat for urgent patient care matters. Secure chat can be used for non urgent patient care matters.  How to contact the [facility] Attending or Consulting provider [place] or covering provider during after hours [place], for this patient?  Check the care team in [facility] and look for a) attending/consulting TRH provider listed and b) the [facility] team listed. Page or secure chat [phone]. Log into [email or web address] and use [facility]'s universal password to access. If you do not have the password, please contact the hospital operator. Locate the [facility] provider you are looking for under [facility] and page to a number that you can be directly reached. If you still have difficulty reaching the provider, please page the [facility] (Director on Call) for  the Hospitalists listed on amion for assistance.

## 2022-04-27 NOTE — Progress Notes (Addendum)
[facility] Progress Note  Background: Reconsulted for AKI. Originally consulted for hyponatremia. 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  Had multiple thoracentesis and had a Pleurx catheter placed.  We were originally consulted for hyponatremia which is likely related to patient's cirrhosis and portal hypertension.  She was started on midodrine and will was started on IV albumin as well initially.  We had signed off on 5/21 as her sodium did improve to 133. We are being reconsulted for AKI.  Her creatinine has been around 0.5-0.6.  Started to progressively rise on 5/27, now up to 1.89 on 5/30.  Her last dose of Lasix was on 5/29.  Has been maintained on midodrine.  She did receive albumin on 5/29 x 2 doses then 1 more dose on 5/30.  Blood pressures have been low since 5/29.  She did also have an issue of hyperkalemia which improved with Lokelma.  Subjective: Patient seen and examined bedside. Husband bedside as well. Quite tearful during my encounter after she found out she has to stay longer in the hospital. She does report that chronically she does not make much urine but does make urine when receiving lasix. She does report that any time she drinks any liquids or receives IV fluids, it comes right out into her pleurex catheter. She otherwise denies any fevers, chills, chest pain, worsening, SOB, n/v/d, loss of appetite, dizziness at rest, dysuria.   Vitals:   04/27/22 0024 04/27/22 0433 04/27/22 0436 04/27/22 0730  BP: (!) 84/52  (!) 82/57 (!) 86/58  Pulse: 79  78 83  Resp: '18  19 18  '$ Temp:    98.1 F (36.7 C)  TempSrc:    Oral  SpO2: 93%  98% 98%  Weight:  36.8 kg    Height:        Exam: Gen: alert, NAD, thin and frail, sitting up in bed HEENT: No jvd Cardiac: RRR, s1s2 Resp:  diminished air entry left>rt base otherwise CTA, left pleurex in place, unlabored Abd: soft, nt/nd Ext: no edema Neuro: awake, alert, speech clear and coherent, moves all ext spontaneously     Recent Labs  Lab 04/21/22 0430 04/22/22 0430 04/23/22 0441 04/26/22 1200 04/26/22 1201 04/26/22 2000 04/27/22 0400  HGB 9.3* 9.2*   < > 10.4*  --   --  10.0*  ALBUMIN 2.7* 2.6*  --   --   --   --   --   CALCIUM 8.1* 8.0*   < >  --  7.9*  --  8.3*  PHOS 3.6  --   --   --   --   --   --   CREATININE 0.63 0.70   < >  --  1.66*  --  1.89*  K 3.9 3.4*   < >  --  5.5* 5.0 5.0   < > = values in this interval not displayed.   Inpatient medications:  Chlorhexidine Gluconate Cloth  6 each Topical Daily   enoxaparin (LOVENOX) injection  30 mg Subcutaneous Q24H   feeding supplement (KATE FARMS STANDARD 1.4)  325 mL Oral Daily   lidocaine  1 patch Transdermal Q24H   magnesium oxide  400 mg Oral BID   midodrine  15 mg Oral TID WC   multivitamin with minerals  1 tablet Oral Daily   nicotine  21 mg Transdermal  Daily   pantoprazole  40 mg Oral Daily   sodium chloride flush  10-40 mL Intracatheter Q12H   sodium chloride flush  3 mL Intravenous Q12H   tamoxifen  20 mg Oral Daily     acetaminophen **OR** acetaminophen, albuterol, baclofen, dicyclomine, guaiFENesin-dextromethorphan, hydrOXYzine, magic mouthwash w/lidocaine, nicotine polacrilex, ondansetron (ZOFRAN) IV, oxyCODONE, phenol, prochlorperazine, senna-docusate, sodium chloride flush   Assessment/ Plan: AKI : at this junction it will prove to be difficult to ascertain what her rise in Cr is from. Could be from diuretics/prerenal, HRS, hemodynamic insults from hypotension. Lasix has already been held. Will continue with albumin for today. Will check renal u/s and UA as well. Her urine lytes from before does point towards HRS (of note, has been receiving lasix as well). If no improvement with Cr despite stabilization of BP & volume resuscitation  over the next 24-48hrs then would start octreotide. No indications for renal replacement therapy at this junction (would address GOC first before considering HD given that she is not a liver txp candidate) Avoid nephrotoxic medications including NSAIDs and iodinated intravenous contrast exposure unless the latter is absolutely indicated.  Preferred narcotic agents for pain control are hydromorphone, fentanyl, and methadone. Morphine should not be used. Avoid Baclofen and avoid oral sodium phosphate and magnesium citrate based laxatives / bowel preps. Continue strict Input and Output monitoring. Will monitor the patient closely with you and intervene or adjust therapy as indicated by changes in clinical status/labs  Hyponatremia - in a pt w/ cirrhosis and portal HTN. Treatment of hyponatremia in cirrhotics is unique in that the main goal is to improve blood pressures, which should then improve renal perfusion and therefore water clearance. Na is now stable, would not expect to be WNL range. HypoNa in itself carries a poor prognosis in the context of cirrhosis Acute on chronic HRF, recurrent left pleural effusion - transudate effusion s/p pleurex catheter Decompensated Cirrhosis - Per review of her chart in care everywhere, she does follow with transplant hepatology and is not a candidate for liver transplant given her underlying [place] and invasive breast Ca Hypotension - midodrine now '15mg'$  tid. Fluids as above. If no improvement in MAPs despite volume resuscitation then increase midodrine to '20mg'$  tid Hyperkalemia-improved. Can utilize lokelma PRN. Low K diet HCC - sp radiation implant H/o breast cancer - rx'd by lumpectomy in [place], MD [facility]

## 2022-04-28 DIAGNOSIS — J9621 Acute and chronic respiratory failure with hypoxia: Secondary | ICD-10-CM | POA: Diagnosis not present

## 2022-04-28 LAB — RENAL FUNCTION PANEL
Albumin: 3.4 g/dL — ABNORMAL LOW (ref 3.5–5.0)
Anion gap: 7 (ref 5–15)
BUN: 48 mg/dL — ABNORMAL HIGH (ref 6–20)
CO2: 22 mmol/L (ref 22–32)
Calcium: 8.4 mg/dL — ABNORMAL LOW (ref 8.9–10.3)
Chloride: 97 mmol/L — ABNORMAL LOW (ref 98–111)
Creatinine, Ser: 1.63 mg/dL — ABNORMAL HIGH (ref 0.44–1.00)
GFR, Estimated: 37 mL/min — ABNORMAL LOW (ref >60)
Glucose, Bld: 100 mg/dL — ABNORMAL HIGH (ref 70–99)
Phosphorus: 4 mg/dL (ref 2.5–4.6)
Potassium: 5.3 mmol/L — ABNORMAL HIGH (ref 3.5–5.1)
Sodium: 126 mmol/L — ABNORMAL LOW (ref 135–145)

## 2022-04-28 LAB — URINALYSIS, ROUTINE W REFLEX MICROSCOPIC
Bilirubin Urine: NEGATIVE
Glucose, UA: NEGATIVE mg/dL
Hgb urine dipstick: NEGATIVE
Ketones, ur: NEGATIVE mg/dL
Leukocytes,Ua: NEGATIVE
Nitrite: NEGATIVE
Protein, ur: NEGATIVE mg/dL
Specific Gravity, Urine: 1.02 (ref 1.005–1.030)
pH: 5 (ref 5.0–8.0)

## 2022-04-28 LAB — CBC
HCT: 29.3 % — ABNORMAL LOW (ref 36.0–46.0)
Hemoglobin: 9.5 g/dL — ABNORMAL LOW (ref 12.0–15.0)
MCH: 25.1 pg — ABNORMAL LOW (ref 26.0–34.0)
MCHC: 32.4 g/dL (ref 30.0–36.0)
MCV: 77.3 fL — ABNORMAL LOW (ref 80.0–100.0)
Platelets: 107 10*3/uL — ABNORMAL LOW (ref 150–400)
RBC: 3.79 MIL/uL — ABNORMAL LOW (ref 3.87–5.11)
RDW: 23.4 % — ABNORMAL HIGH (ref 11.5–15.5)
WBC: 6.5 10*3/uL (ref 4.0–10.5)
nRBC: 0 % (ref 0.0–0.2)

## 2022-04-28 LAB — SODIUM, URINE, RANDOM: Sodium, Ur: <10 mmol/L

## 2022-04-28 LAB — CREATININE, SERUM
Creatinine, Ser: 1.82 mg/dL — ABNORMAL HIGH (ref 0.44–1.00)
GFR, Estimated: 32 mL/min — ABNORMAL LOW (ref >60)

## 2022-04-28 LAB — CREATININE, URINE, RANDOM: Creatinine, Urine: 171.69 mg/dL

## 2022-04-28 MED ORDER — SODIUM ZIRCONIUM CYCLOSILICATE 10 G PO PACK
10.0000 g | PACK | Freq: Once | ORAL | Status: AC
  Administered 2022-04-28: 10 g via ORAL
  Filled 2022-04-28: qty 1

## 2022-04-28 MED ORDER — OCTREOTIDE ACETATE 100 MCG/ML IJ SOLN
100.0000 ug | Freq: Three times a day (TID) | INTRAMUSCULAR | Status: DC
Start: 2022-04-28 — End: 2022-04-30
  Administered 2022-04-28 – 2022-04-30 (×6): 100 ug via SUBCUTANEOUS
  Filled 2022-04-28 (×8): qty 1

## 2022-04-28 NOTE — Progress Notes (Signed)
Nutrition Follow-up   DOCUMENTATION CODES:   Severe malnutrition in context of chronic illness, Underweight  INTERVENTION:   Continue Regular diet.  Continue [employer] standard 1.4 once daily at bedtime.   Continue MVI with minerals daily.  NUTRITION DIAGNOSIS:   Severe Malnutrition related to chronic illness (cancer, cirrhosis) as evidenced by severe muscle depletion, severe fat depletion.  Ongoing   GOAL:   Patient will meet greater than or equal to 90% of their needs  Progressing  MONITOR:   PO intake, Supplement acceptance  REASON FOR ASSESSMENT:   Consult Assessment of nutrition requirement/status  ASSESSMENT:   Pt with medical history significant for cancer of the right breast, hepatocellular carcinoma, cirrhosis, and recurrent left pleural effusion with PleurX catheter for evaluation of worsening hyponatremia.  Patient is being treated for hepatorenal syndrome.  Lasix has been discontinued. Octreotide started today. Received Lokelma today for hyperkalemia. Pleurex drain in place. 950 ml output documented x 24 hours. UOP 320 ml x 24 hours.   Patient states that PO intake has improved with diet liberalization. She had an omelet for breakfast today and ate almost half of it. She has been drinking the [employer] supplement at night, but cannot drink the entire bottle because it fills her up too much. She has some "lighter" supplements that she will drink when she is discharged home.   Labs reviewed. Na 126, K 5.3 Medications reviewed and include octreotide, Mag-Ox, MVI with minerals, Protonix.  Diet Order:   Diet Order             Diet regular Room service appropriate? Yes; Fluid consistency: Thin  Diet effective now                   EDUCATION NEEDS:   Education needs have been addressed  Skin:  Skin Assessment: Reviewed RN Assessment  Last BM:  5/30 (type 5/6, medium, brown)  Height:   Ht Readings from Last 1 Encounters:  04/20/22 '5\' 3"'$   (1.6 m)    Weight:   Wt Readings from Last 1 Encounters:  04/28/22 36.9 kg    Ideal Body Weight:  52.3 kg  BMI:  Body mass index is 14.42 kg/m.  Estimated Nutritional Needs:   Kcal:  1800-2000  Protein:  105-120 grams  Fluid:  > 1.8 L    Lucas Mallow RD, LDN, CNSC Please refer to Amion for contact information.

## 2022-04-28 NOTE — Progress Notes (Signed)
04/28/2022 8:27 AM   Pt ambulated 450 ft with rolling walker on RA.  O2 sats were 90-100%.  Pt tolerated well, no dizziness reported and no brakes.  BP after walk was 84/65. [place]

## 2022-04-28 NOTE — Progress Notes (Signed)
Theresa Rocha Progress Note  Background: Reconsulted for AKI. Originally consulted for hyponatremia. 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  Had multiple thoracentesis and had a Pleurx catheter placed.  We were originally consulted for hyponatremia which is likely related to patient's cirrhosis and portal hypertension.  She was started on midodrine and will was started on IV albumin as well initially.  We had signed off on 5/21 as her sodium did improve to 133. We are being reconsulted for AKI.  Her creatinine has been around 0.5-0.6.  Started to progressively rise on 5/27, now up to 1.89 on 5/30.  Her last dose of Lasix was on 5/29.  Has been maintained on midodrine.  She did receive albumin on 5/29 x 2 doses then 1 more dose on 5/30.  Blood pressures have been low since 5/29.  She did also have an issue of hyperkalemia which improved with Lokelma.  Subjective: Patient seen and examined bedside. No acute events overnight, no complaints. Understandably, she is very eager to go home. She did ambulate yesterday with assistance and she denied any issues with dizziness. Uop charted is around 0.3L.   Vitals:   04/28/22 0418 04/28/22 0419 04/28/22 0441 04/28/22 0735  BP: (!) 81/52 (!) 87/55  (!) 76/50  Pulse: 83 75  77  Resp: '14 16  18  '$ Temp:    98.3 F (36.8 C)  TempSrc:      SpO2: 99% 97%  100%  Weight:   36.9 kg   Height:        Exam: Gen: alert, NAD, thin and frail, sitting up in bed HEENT: No jvd Cardiac: RRR, s1s2 Resp: diminished air entry left>rt base otherwise CTA, left pleurex in place, unlabored Abd: soft, nt/nd Ext: no edema Neuro: awake, alert, speech clear and coherent, moves all ext spontaneously     Recent Labs  Lab 04/22/22 0430 04/23/22 0441 04/27/22 0400  04/28/22 0425 04/28/22 0630  HGB 9.2*   < > 10.0*  --  9.5*  ALBUMIN 2.6*  --   --   --  3.4*  CALCIUM 8.0*   < > 8.3*  --  8.4*  PHOS  --   --   --   --  4.0  CREATININE 0.70   < > 1.89* 1.82* 1.63*  K 3.4*   < > 5.0  --  5.3*   < > = values in this interval not displayed.   Inpatient medications:  Chlorhexidine Gluconate Cloth  6 each Topical Daily   enoxaparin (LOVENOX) injection  30 mg Subcutaneous Q24H   feeding supplement (KATE FARMS STANDARD 1.4)  325 mL Oral Daily   lidocaine  1 patch Transdermal Q24H   magnesium oxide  400 mg Oral BID   midodrine  15 mg Oral TID WC   multivitamin with minerals  1 tablet Oral Daily   nicotine  21 mg Transdermal Daily   octreotide  100 mcg Subcutaneous TID   pantoprazole  40 mg Oral Daily   sodium chloride flush  10-40 mL Intracatheter Q12H   sodium chloride flush  3 mL Intravenous Q12H   sodium zirconium cyclosilicate  10 g Oral Once   tamoxifen  20 mg Oral Daily     acetaminophen **OR** acetaminophen, albuterol, baclofen, dicyclomine, guaiFENesin-dextromethorphan, hydrOXYzine, magic mouthwash w/lidocaine, nicotine polacrilex, ondansetron (ZOFRAN) IV, oxyCODONE, phenol, prochlorperazine, senna-docusate,  sodium chloride flush   Assessment/ Plan: AKI : likely related to HRS--UA bland, renal u/s unremarkable. Cr a touch better today, down to 1.6 Continue to hold lasix, s/p albumin x 48 hrs, on midodrine. Will start octreotide subq 131mg TID Would recommend re-engaging with GI in regards to other options (I.e. is she a TIPS candidate) Avoid nephrotoxic medications including NSAIDs and iodinated intravenous contrast exposure unless the latter is absolutely indicated.  Preferred narcotic agents for pain control are hydromorphone, fentanyl, and methadone. Morphine should not be used. Avoid Baclofen and avoid oral sodium phosphate and magnesium citrate based laxatives / bowel preps. Continue strict Input and Output monitoring. Will monitor the  patient closely with you and intervene or adjust therapy as indicated by changes in clinical status/labs  Hyponatremia - in a pt w/ cirrhosis and portal HTN. Treatment of hyponatremia in cirrhotics is unique in that the main goal is to improve blood pressures, which should then improve renal perfusion and therefore water clearance. Na is now stable, would not expect to be WNL range. HypoNa in itself carries a poor prognosis in the context of cirrhosis. Encouraged solute intake (her PO intake is poor to begin with, hence her diet has been liberalized), HRS plan as above Acute on chronic HRF, recurrent left pleural effusion - transudate effusion s/p pleurex catheter Decompensated Cirrhosis - Per review of her chart in care everywhere, she does follow with transplant hepatology and is not a candidate for liver transplant given [place] Hypotension - midodrine now '15mg'$  tid. Adding octreotide Hyperkalemia-improved. Can utilize lokelma PRN, will give a dose today for K 5.3. Low K diet HCC - sp radiation implant H/o breast cancer - [place] [place] [facility] [place] [facility]

## 2022-04-28 NOTE — Progress Notes (Signed)
PROGRESS NOTE  Theresa Rocha  DOB: 07/22/1966  PCP: Seward Carol, MD HBZ:[id number]  DOA: 04/14/2022  LOS: 27 days  Hospital Day: 15  Brief narrative: Theresa Rocha is a 56 y.o. female with PMH significant for right sided breast cancer, hepatocellular cancer, chronic hepatitis C, nonalcoholic liver cirrhosis, portal hypertension, esophageal varices, lactose intolerance, IBS, chronic anemia, thrombocytopenia, COPD who also has a Pleurx catheter on the left side because of recurrent pleural effusion. Patient was sent to the ED on 5/17 for low sodium level of 119. Patient was admitted to hospitalist service.   Her hospital course was also complicated by hyperkalemia, AKI. She was seen by nephrology, CT surgery, oncology. See below for details.   Subjective: Patient was seen and examined this morning.  Pleasant unfortunate middle-aged Caucasian female.  Thin build.  Sitting up in bed.  Not in supplemental oxygen.  Has Pleurx catheter on the left and draining to chest tube.  Husband on the phone. Labs from this morning showed worsening sodium, potassium level, creatinine slightly better.  Nephrology follow-up appreciated.  Principal Problem:   Acute on chronic respiratory failure with hypoxia (HCC) Active Problems:   Hyponatremia   SIRS (systemic inflammatory response syndrome) (HCC)   Hyperkalemia   Decompensated HCV cirrhosis (HCC)   Pleural effusion on left   Hypotension   Hepatocellular carcinoma (HCC)   Thrombocytopenia (HCC)   Anemia   Breast cancer (HCC)   Protein-calorie malnutrition, severe (HCC)    Assessment and Plan: Acute on chronic hyponatremia -Chronic hyponatremia due to hypovolemia related to liver cirrhosis and portal hypertension.  Initially admitted with a very low sodium level 119.  Nephrology consult was obtained.  She was started on midodrine, IV albumin.  Sodium level eventually improved but is fluctuating and worsening again.  Nephrology continues to  follow.  Currently not on diuretics. Recent Labs  Lab 04/22/22 0430 04/23/22 0441 04/24/22 0522 04/25/22 0430 04/26/22 1048 04/26/22 1201 04/27/22 0400 04/28/22 0630  NA 128* 130* 129* 131* 125* 126* 129* 126*   AKI -Baseline creatinine normal.  Since 5/28, her creatinine started to worsen, peaked on 5/30 at 1.89.  Gradually improving.  Off Lasix at this time.  Likely hepatorenal syndrome..  Nephrology following Recent Labs    04/20/22 0347 04/21/22 0430 04/22/22 0430 04/23/22 0441 04/24/22 0522 04/25/22 0430 04/26/22 1048 04/26/22 1201 04/27/22 0400 04/28/22 0425 04/28/22 0630  BUN '20 20 19 20 '$ 25* 30* 41* 43* 48*  --  48*  CREATININE 0.70 0.63 0.70 0.77 0.84 1.06* 1.55* 1.66* 1.89* 1.82* 1.63*   Hypotension -Blood pressure low in 70s today.  Currently on Midodrine '15mg'$  TID.  Status post albumin for 48 hours noted a plan from nephrology to add octreotide 100 mcg 3 times daily.  Recurrent pleural effusion -Patient states her pleural effusion started after microaspiration liver lesions in October 2022.  She required multiple thoracentesis and has left Pleurx catheter was placed in March 2023. -Last seen by CT surgery on 5/24.  Currently her Pleurx catheter is connected to Pleur-evac. -Per GI note from 5/19, "no plans for TIPS at this time, per IR Dr. Kathlene Cote, absolute last resort as there is no guarantee that TIPS will help."  Liver cirrhosis secondary to hepatitis C and alcohol use Portal hypertension, ascites History of esophageal varices and portal gastropathy History of upper GI bleeding -Follows up with [facility]. -ID UGI bleed in November 2022, varices were banded. -Patient currently currently on transplant list but was diagnosed with breast cancer  and had lumpectomy in November 2022 and hence no longer a transplant candidate. -Continue Protonix    Hypokalemia/hyperkalemia Hypomagnesemia -Potassium level was initially low and supplemented but started to worsen  again.  Nephrology following -Hemoglobin improved with replacement. Recent Labs  Lab 04/22/22 0430 04/23/22 0441 04/24/22 0522 04/25/22 0430 04/26/22 1048 04/26/22 1201 04/26/22 2000 04/27/22 0400 04/28/22 0630  K 3.4*   < > 4.4   < > 5.6* 5.5* 5.0 5.0 5.3*  MG 1.6*  --  2.2  --   --   --   --   --   --   PHOS  --   --   --   --   --   --   --   --  4.0   < > = values in this interval not displayed.   Anemia of chronic disease -Hemoglobin stable to more than 9.  Continue to monitor Recent Labs    05/01/21 1114 09/01/21 1316 04/22/22 0430 04/24/22 0522 04/26/22 1048 04/26/22 1200 04/27/22 0400 04/28/22 0630  HGB  --    < > 9.2*  --  10.6* 10.4* 10.0* 9.5*  MCV  --    < > 76.3*  --  77.2* 76.7* 75.6* 77.3*  FERRITIN  --   --   --  98  --   --   --   --   TIBC 451*  --   --  181*  --   --   --   --   IRON 37*  --   --  14*  --   --   --   --    < > = values in this interval not displayed.   Chronic thrombocytopenia  -Secondary to liver cirrhosis -Stable.  No bleeding Recent Labs  Lab 04/22/22 0430 04/26/22 1048 04/26/22 1200 04/27/22 0400 04/28/22 0630  PLT 84* 143* PLATELET CLUMPS NOTED ON SMEAR, COUNT APPEARS ADEQUATE 118* 107*    History of breast cancer History of HCC -Seen by [name] while in the hospital. -Noted a plan to repeat her abdominal MRI after discharge for [facility] follow up  Goals of care   Code Status: Full Code    Mobility: Encourage ambulation  Skin assessment:     Nutritional status:  Body mass index is 14.42 kg/m.  Nutrition Problem: Severe Malnutrition Etiology: chronic illness (cancer, cirrhosis) Signs/Symptoms: severe muscle depletion, severe fat depletion     Diet:  Diet Order             Diet regular Room service appropriate? Yes; Fluid consistency: Thin  Diet effective now                   DVT prophylaxis:  enoxaparin (LOVENOX) injection 30 mg Start: 04/14/22 2230   Antimicrobials: None Fluid:  None Consultants: Nephrology Family Communication: None at bedside.  Husband on the phone  Status is: Inpatient  Continue in-hospital care because: Hypotension, AKI, abnormal electrolytes Level of care: Progressive   Dispo: The patient is from: Home              Anticipated d/c is to: Home, in 2 to 3 days likely              Patient currently is not medically stable to d/c.   Difficult to place patient No     Infusions:    Scheduled Meds:  Chlorhexidine Gluconate Cloth  6 each Topical Daily   enoxaparin (LOVENOX) injection  30 mg Subcutaneous  Q24H   feeding supplement (KATE FARMS STANDARD 1.4)  325 mL Oral Daily   lidocaine  1 patch Transdermal Q24H   magnesium oxide  400 mg Oral BID   midodrine  15 mg Oral TID WC   multivitamin with minerals  1 tablet Oral Daily   nicotine  21 mg Transdermal Daily   octreotide  100 mcg Subcutaneous TID   pantoprazole  40 mg Oral Daily   sodium chloride flush  10-40 mL Intracatheter Q12H   sodium chloride flush  3 mL Intravenous Q12H   tamoxifen  20 mg Oral Daily    PRN meds: acetaminophen **OR** acetaminophen, albuterol, baclofen, dicyclomine, guaiFENesin-dextromethorphan, hydrOXYzine, magic mouthwash w/lidocaine, nicotine polacrilex, ondansetron (ZOFRAN) IV, oxyCODONE, phenol, prochlorperazine, senna-docusate, sodium chloride flush   Antimicrobials: Anti-infectives (From admission, onward)    Start     Dose/Rate Route Frequency Ordered Stop   04/20/22 1600  vancomycin (VANCOCIN) IVPB 1000 mg/200 mL premix  Status:  Discontinued        1,000 mg 200 mL/hr over 60 Minutes Intravenous Every 24 hours 04/19/22 1444 04/19/22 1544   04/20/22 1400  vancomycin (VANCOREADY) IVPB 500 mg/100 mL  Status:  Discontinued        500 mg 100 mL/hr over 60 Minutes Intravenous Every 24 hours 04/19/22 1604 04/20/22 1145   04/20/22 0845  fluconazole (DIFLUCAN) tablet 150 mg        150 mg Oral  Once 04/20/22 0747 04/20/22 0941   04/19/22 1645  vancomycin  (VANCOREADY) IVPB 750 mg/150 mL  Status:  Discontinued        750 mg 150 mL/hr over 60 Minutes Intravenous  Once 04/19/22 1552 04/19/22 1604   04/19/22 1645  vancomycin (VANCOREADY) IVPB 750 mg/150 mL        750 mg 150 mL/hr over 60 Minutes Intravenous  Once 04/19/22 1605 04/19/22 1753   04/19/22 1600  ceFEPIme (MAXIPIME) 2 g in sodium chloride 0.9 % 100 mL IVPB  Status:  Discontinued        2 g 200 mL/hr over 30 Minutes Intravenous Every 8 hours 04/19/22 1441 04/19/22 1545   04/19/22 1600  ceFEPIme (MAXIPIME) 2 g in sodium chloride 0.9 % 100 mL IVPB  Status:  Discontinued        2 g 200 mL/hr over 30 Minutes Intravenous Every 12 hours 04/19/22 1545 04/25/22 1343   04/19/22 1530  vancomycin (VANCOCIN) IVPB 1000 mg/200 mL premix  Status:  Discontinued        1,000 mg 200 mL/hr over 60 Minutes Intravenous  Once 04/19/22 1441 04/19/22 1544   04/16/22 1800  Ampicillin-Sulbactam (UNASYN) 3 g in sodium chloride 0.9 % 100 mL IVPB  Status:  Discontinued        3 g 200 mL/hr over 30 Minutes Intravenous Every 6 hours 04/16/22 1139 04/19/22 1421   04/16/22 1000  Ampicillin-Sulbactam (UNASYN) 3 g in sodium chloride 0.9 % 100 mL IVPB  Status:  Discontinued        3 g 200 mL/hr over 30 Minutes Intravenous Every 6 hours 04/16/22 0935 04/16/22 1139       Objective: Vitals:   04/28/22 0735 04/28/22 1215  BP: (!) 76/50 (!) 76/57  Pulse: 77 73  Resp: 18 18  Temp: 98.3 F (36.8 C) 97.7 F (36.5 C)  SpO2: 100% 97%    Intake/Output Summary (Last 24 hours) at 04/28/2022 1255 Last data filed at 04/28/2022 0813 Gross per 24 hour  Intake 927 ml  Output 1150 ml  Net -223 ml   Filed Weights   04/26/22 0337 04/27/22 0433 04/28/22 0441  Weight: 36.8 kg 36.8 kg 36.9 kg   Weight change: 0.136 kg Body mass index is 14.42 kg/m.   Physical Exam: General exam: Thin built middle-aged Caucasian female.  Not in physical distress Skin: No rashes, lesions or ulcers. HEENT: Atraumatic, normocephalic, no  obvious bleeding Lungs: Clear to auscultation bilaterally.  Left chest wall with PleurX catheter draining to Pleur-evac CVS: Regular rate and rhythm, no murmur GI/Abd soft, nontender, nondistended, bowel sound present CNS: Alert, awake, oriented x3 Psychiatry: Sad affect Extremities: No pedal edema, no calf tenderness  Data Review: I have personally reviewed the laboratory data and studies available.  F/u labs ordered Unresulted Labs (From admission, onward)     Start     Ordered   04/21/22 0500  Creatinine, serum  (enoxaparin (LOVENOX)    CrCl >/= 30 ml/min)  Weekly,   R     Comments: while on enoxaparin therapy    04/14/22 2226   Unscheduled  CBC with Differential/Platelet  Daily,   R     Question:  Specimen collection method  Answer:  Unit=Unit collect   04/28/22 1255   Unscheduled  Basic metabolic panel  Daily,   R     Question:  Specimen collection method  Answer:  Unit=Unit collect   04/28/22 1255            Signed, Terrilee Croak, MD [facility] 04/28/2022

## 2022-04-28 NOTE — Progress Notes (Signed)
Mobility Specialist Progress Note    04/28/22 1556  Mobility  Activity Ambulated with assistance in hallway  Level of Assistance Standby assist, set-up cues, supervision of patient - no hands on  Assistive Device Front wheel walker  Distance Ambulated (ft) 470 ft  Activity Response Tolerated well  $Mobility charge 1 Mobility   Pre-Mobility: 71 HR, 98/63 BP, 99% SpO2 Post-Mobility: 70 HR  Pt received in bed and agreeable. C/o feeling a little weak upon return. Left in bed with call bell in reach.    Hildred Alamin Mobility Specialist  Primary: 5N M.S. Phone: [phone] Secondary: 6N M.S. Phone: [phone]

## 2022-04-29 DIAGNOSIS — J9621 Acute and chronic respiratory failure with hypoxia: Secondary | ICD-10-CM | POA: Diagnosis not present

## 2022-04-29 LAB — CBC WITH DIFFERENTIAL/PLATELET
Abs Immature Granulocytes: 0.06 10*3/uL (ref 0.00–0.07)
Basophils Absolute: 0.1 10*3/uL (ref 0.0–0.1)
Basophils Relative: 1 %
Eosinophils Absolute: 0.2 10*3/uL (ref 0.0–0.5)
Eosinophils Relative: 3 %
HCT: 29.5 % — ABNORMAL LOW (ref 36.0–46.0)
Hemoglobin: 9.7 g/dL — ABNORMAL LOW (ref 12.0–15.0)
Immature Granulocytes: 1 %
Lymphocytes Relative: 13 %
Lymphs Abs: 0.9 10*3/uL (ref 0.7–4.0)
MCH: 25.5 pg — ABNORMAL LOW (ref 26.0–34.0)
MCHC: 32.9 g/dL (ref 30.0–36.0)
MCV: 77.6 fL — ABNORMAL LOW (ref 80.0–100.0)
Monocytes Absolute: 1.3 10*3/uL — ABNORMAL HIGH (ref 0.1–1.0)
Monocytes Relative: 20 %
Neutro Abs: 4.1 10*3/uL (ref 1.7–7.7)
Neutrophils Relative %: 62 %
Platelets: 118 10*3/uL — ABNORMAL LOW (ref 150–400)
RBC: 3.8 MIL/uL — ABNORMAL LOW (ref 3.87–5.11)
RDW: 23.8 % — ABNORMAL HIGH (ref 11.5–15.5)
WBC: 6.7 10*3/uL (ref 4.0–10.5)
nRBC: 0 % (ref 0.0–0.2)

## 2022-04-29 LAB — BASIC METABOLIC PANEL
Anion gap: 5 (ref 5–15)
BUN: 45 mg/dL — ABNORMAL HIGH (ref 6–20)
CO2: 25 mmol/L (ref 22–32)
Calcium: 8.2 mg/dL — ABNORMAL LOW (ref 8.9–10.3)
Chloride: 102 mmol/L (ref 98–111)
Creatinine, Ser: 1.42 mg/dL — ABNORMAL HIGH (ref 0.44–1.00)
GFR, Estimated: 44 mL/min — ABNORMAL LOW (ref >60)
Glucose, Bld: 123 mg/dL — ABNORMAL HIGH (ref 70–99)
Potassium: 5.2 mmol/L — ABNORMAL HIGH (ref 3.5–5.1)
Sodium: 132 mmol/L — ABNORMAL LOW (ref 135–145)

## 2022-04-29 NOTE — Progress Notes (Signed)
[facility] Progress Note  Background: Reconsulted for AKI. Originally consulted for hyponatremia. 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  Had multiple thoracentesis and had a Pleurx catheter placed.  We were originally consulted for hyponatremia which is likely related to patient's cirrhosis and portal hypertension.  She was started on midodrine and will was started on IV albumin as well initially.  We had signed off on 5/21 as her sodium did improve to 133. We are being reconsulted for AKI.  Her creatinine has been around 0.5-0.6.  Started to progressively rise on 5/27, now up to 1.89 on 5/30.  Her last dose of Lasix was on 5/29.  Has been maintained on midodrine.  She did receive albumin on 5/29 x 2 doses then 1 more dose on 5/30.  Blood pressures have been low since 5/29.  She did also have an issue of hyperkalemia which improved with Lokelma.  Subjective: Patient seen and examined bedside along with hospitalist. No acute events overnight, no complaints. BP's slightly better now. She vomited up her lokelma yesterday Husband at bedside   Vitals:   04/29/22 0141 04/29/22 0417 04/29/22 0601 04/29/22 0739  BP: (!) 87/61 (!) 84/57  91/63  Pulse: 76 72 89 73  Resp: '20 20 17 19  '$ Temp: 98.4 F (36.9 C) 98.2 F (36.8 C)  (!) 97.3 F (36.3 C)  TempSrc: Oral Oral  Oral  SpO2: 99% 100% 100% 98%  Weight:   36.8 kg   Height:        Exam: Gen: alert, NAD, thin and frail, sitting up in bed HEENT: No jvd Cardiac: RRR, s1s2 Resp: normal WOB, left pleurex in place, unlabored Abd: soft, nt/nd Ext: no edema Neuro: awake, alert, speech clear and coherent, moves all ext spontaneously     Recent Labs  Lab 04/28/22 0630 04/29/22 0340  HGB 9.5* 9.7*  ALBUMIN 3.4*  --   CALCIUM 8.4* 8.2*   PHOS 4.0  --   CREATININE 1.63* 1.42*  K 5.3* 5.2*   Inpatient medications:  Chlorhexidine Gluconate Cloth  6 each Topical Daily   enoxaparin (LOVENOX) injection  30 mg Subcutaneous Q24H   feeding supplement (KATE FARMS STANDARD 1.4)  325 mL Oral Daily   lidocaine  1 patch Transdermal Q24H   magnesium oxide  400 mg Oral BID   midodrine  15 mg Oral TID WC   multivitamin with minerals  1 tablet Oral Daily   nicotine  21 mg Transdermal Daily   octreotide  100 mcg Subcutaneous TID   pantoprazole  40 mg Oral Daily   sodium chloride flush  10-40 mL Intracatheter Q12H   sodium chloride flush  3 mL Intravenous Q12H   tamoxifen  20 mg Oral Daily     acetaminophen **OR** acetaminophen, albuterol, baclofen, dicyclomine, guaiFENesin-dextromethorphan, hydrOXYzine, magic mouthwash w/lidocaine, nicotine polacrilex, ondansetron (ZOFRAN) IV, oxyCODONE, phenol, prochlorperazine, senna-docusate, sodium chloride flush   Assessment/ Plan: AKI : likely related to HRS--UA bland, renal u/s unremarkable. Cr better, down to 1.4 Continue to hold lasix, s/p albumin x 48 hrs, on midodrine '15mg'$  TID and octreotide subq 116mg TID. Would recommend engaging with outpatient GI/hepatology in regards to other options with octreotide since she (understandably) wants to go home ASAP, appreciate assistance from primary service in regards to getting this set up Avoid nephrotoxic medications including NSAIDs and iodinated intravenous contrast  exposure unless the latter is absolutely indicated.  Preferred narcotic agents for pain control are hydromorphone, fentanyl, and methadone. Morphine should not be used. Avoid Baclofen and avoid oral sodium phosphate and magnesium citrate based laxatives / bowel preps. Continue strict Input and Output monitoring. Will monitor the patient closely with you and intervene or adjust therapy as indicated by changes in clinical status/labs  Hyponatremia - in a pt w/ cirrhosis and portal HTN.  Treatment of hyponatremia in cirrhotics is unique in that the main goal is to improve blood pressures, which should then improve renal perfusion and therefore water clearance. Na is now stable, would not expect to be WNL range. HypoNa in itself carries a poor prognosis in the context of cirrhosis. Encouraged solute intake (her PO intake is poor to begin with, hence her diet has been liberalized), HRS plan as above. Na improved to 132 Acute on chronic HRF, recurrent left pleural effusion - transudate effusion s/p pleurex catheter Decompensated Cirrhosis - Per review of her chart in care everywhere, she does follow with transplant hepatology and is not a candidate for liver transplant given her underlying [place] and invasive breast Ca Hypotension - midodrine now '15mg'$  tid and octreotide 117mg tid Hyperkalemia-improved/stable. Can utilize lokelma PRN, K 5.2, would recommend low K diet HCC - sp radiation implant H/o breast cancer - rx'd by lumpect[place] [facility]

## 2022-04-29 NOTE — Progress Notes (Signed)
PROGRESS NOTE  Theresa Rocha  DOB: June 06, 1966  PCP: Seward Carol, MD DUK:[id number]  DOA: 04/14/2022  LOS: 4 days  Hospital Day: 16  Brief narrative: Theresa Rocha is a 56 y.o. female with PMH significant for right sided breast cancer, hepatocellular cancer, chronic hepatitis C, nonalcoholic liver cirrhosis, portal hypertension, esophageal varices, lactose intolerance, IBS, chronic anemia, thrombocytopenia, COPD who also has a Pleurx catheter on the left side because of recurrent pleural effusion. Patient was sent to the ED on 5/17 for low sodium level of 119. Patient was admitted to hospitalist service.   Her hospital course was also complicated by hyperkalemia, AKI. She was seen by nephrology, CT surgery, oncology. See below for details.   Subjective: Patient was seen and examined this morning together with nephrologist Dr. Candiss Norse.   Patient feels okay.  Husband at bedside. Labs from this morning discussed.  Her blood pressure, sodium and creatinine level are slowly improving on octreotide subcu injections. I called and discussed with patient's hepatology NP Dawn Drozik at [facility]  I asked if their practice do octreotide as an outpatient.  The answer was no.  Principal Problem:   Acute on chronic respiratory failure with hypoxia (HCC) Active Problems:   Hyponatremia   SIRS (systemic inflammatory response syndrome) (HCC)   Hyperkalemia   Decompensated HCV cirrhosis (HCC)   Pleural effusion on left   Hypotension   Hepatocellular carcinoma (HCC)   Thrombocytopenia (HCC)   Anemia   Breast cancer (HCC)   Protein-calorie malnutrition, severe (HCC)    Assessment and Plan: Acute on chronic hyponatremia -Chronic hyponatremia due to hypovolemia related to liver cirrhosis and portal hypertension.  Patient was initially admitted with a very low sodium level 119.  Nephrology consult was obtained.  She was started on midodrine, IV albumin.  Sodium level eventually improved but is  fluctuating and worsening again.  Nephrology continues to follow.  Sodium level improving, 132 today. Recent Labs  Lab 04/23/22 0441 04/24/22 0522 04/25/22 0430 04/26/22 1048 04/26/22 1201 04/27/22 0400 04/28/22 0630 04/29/22 0340  NA 130* 129* 131* 125* 126* 129* 126* 132*    AKI -Baseline creatinine normal.  Since 5/28, her creatinine started to worsen, peaked on 5/30 at 1.89.  Gradually improving, 1.42 today off Lasix at this time.  Likely hepatorenal syndrome..  Continue octreotide.  Nephrology following Recent Labs    04/21/22 0430 04/22/22 0430 04/23/22 0441 04/24/22 0522 04/25/22 0430 04/26/22 1048 04/26/22 1201 04/27/22 0400 04/28/22 0425 04/28/22 0630 04/29/22 0340  BUN '20 19 20 '$ 25* 30* 41* 43* 48*  --  48* 45*  CREATININE 0.63 0.70 0.77 0.84 1.06* 1.55* 1.66* 1.89* 1.82* 1.63* 1.42*    Hypotension -Blood pressure low in 70s today.  Currently on Midodrine '15mg'$  TID.  Status post albumin for 48 hours.  Also on octreotide 100 mcg 3 times daily.  Recurrent pleural effusion -Patient states her pleural effusion started after microaspiration liver lesions in October 2022.  She required multiple thoracentesis and has left Pleurx catheter was placed in March 2023. -Last seen by CT surgery on 5/24.  Currently her Pleurx catheter is connected to Pleur-evac. -Per GI note from 5/19, "no plans for TIPS at this time, per IR Dr. Kathlene Cote, absolute last resort as there is no guarantee that TIPS will help."  Liver cirrhosis secondary to hepatitis C and alcohol use Portal hypertension, ascites History of esophageal varices and portal gastropathy History of upper GI bleeding -Follows up with [facility]. -ID UGI bleed in November 2022,  varices were banded. -Patient was previously on transplant list but was diagnosed with breast cancer and had lumpectomy in November 2022 and hence no longer a transplant candidate. -Continue Protonix     Hypokalemia/hyperkalemia Hypomagnesemia -Potassium level was initially low and supplemented but started to worsen again.  Nephrology following -Magnesium level improved with replacement. Recent Labs  Lab 04/24/22 0522 04/25/22 0430 04/26/22 1201 04/26/22 2000 04/27/22 0400 04/28/22 0630 04/29/22 0340  K 4.4   < > 5.5* 5.0 5.0 5.3* 5.2*  MG 2.2  --   --   --   --   --   --   PHOS  --   --   --   --   --  4.0  --    < > = values in this interval not displayed.    Anemia of chronic disease -Hemoglobin stable to more than 9.  Continue to monitor Recent Labs    05/01/21 1114 09/01/21 1316 04/24/22 0522 04/26/22 1048 04/26/22 1200 04/27/22 0400 04/28/22 0630 04/29/22 0340  HGB  --    < >  --  10.6* 10.4* 10.0* 9.5* 9.7*  MCV  --    < >  --  77.2* 76.7* 75.6* 77.3* 77.6*  FERRITIN  --   --  98  --   --   --   --   --   TIBC 451*  --  181*  --   --   --   --   --   IRON 37*  --  14*  --   --   --   --   --    < > = values in this interval not displayed.    Chronic thrombocytopenia  -Secondary to liver cirrhosis -Stable.  No bleeding Recent Labs  Lab 04/26/22 1048 04/26/22 1200 04/27/22 0400 04/28/22 0630 04/29/22 0340  PLT 143* PLATELET CLUMPS NOTED ON SMEAR, COUNT APPEARS ADEQUATE 118* 107* 118*     History of breast cancer History of HCC -Seen by [name] while in the hospital. -Noted a plan to repeat her abdominal MRI after discharge for [facility] follow up  Goals of care   Code Status: Full Code    Mobility: Encourage ambulation  Skin assessment:     Nutritional status:  Body mass index is 14.37 kg/m.  Nutrition Problem: Severe Malnutrition Etiology: chronic illness (cancer, cirrhosis) Signs/Symptoms: severe muscle depletion, severe fat depletion     Diet:  Diet Order             Diet regular Room service appropriate? Yes with Assist; Fluid consistency: Thin  Diet effective now                   DVT prophylaxis:  enoxaparin (LOVENOX)  injection 30 mg Start: 04/14/22 2230   Antimicrobials: None Fluid: None Consultants: Nephrology Family Communication: Husband at bedside  Status is: Inpatient  Continue in-hospital care because: Hypotension, AKI, abnormal electrolytes.  Unable to do octreotide as an outpatient Level of care: Progressive   Dispo: The patient is from: Home              Anticipated d/c is to: Home, in 2 to 3 days likely              Patient currently is not medically stable to d/c.   Difficult to place patient No     Infusions:    Scheduled Meds:  Chlorhexidine Gluconate Cloth  6 each Topical Daily   enoxaparin (LOVENOX)  injection  30 mg Subcutaneous Q24H   feeding supplement (KATE FARMS STANDARD 1.4)  325 mL Oral Daily   lidocaine  1 patch Transdermal Q24H   magnesium oxide  400 mg Oral BID   midodrine  15 mg Oral TID WC   multivitamin with minerals  1 tablet Oral Daily   nicotine  21 mg Transdermal Daily   octreotide  100 mcg Subcutaneous TID   pantoprazole  40 mg Oral Daily   sodium chloride flush  10-40 mL Intracatheter Q12H   sodium chloride flush  3 mL Intravenous Q12H   tamoxifen  20 mg Oral Daily    PRN meds: acetaminophen **OR** acetaminophen, albuterol, baclofen, dicyclomine, guaiFENesin-dextromethorphan, hydrOXYzine, magic mouthwash w/lidocaine, nicotine polacrilex, ondansetron (ZOFRAN) IV, oxyCODONE, phenol, prochlorperazine, senna-docusate, sodium chloride flush   Antimicrobials: Anti-infectives (From admission, onward)    Start     Dose/Rate Route Frequency Ordered Stop   04/20/22 1600  vancomycin (VANCOCIN) IVPB 1000 mg/200 mL premix  Status:  Discontinued        1,000 mg 200 mL/hr over 60 Minutes Intravenous Every 24 hours 04/19/22 1444 04/19/22 1544   04/20/22 1400  vancomycin (VANCOREADY) IVPB 500 mg/100 mL  Status:  Discontinued        500 mg 100 mL/hr over 60 Minutes Intravenous Every 24 hours 04/19/22 1604 04/20/22 1145   04/20/22 0845  fluconazole (DIFLUCAN)  tablet 150 mg        150 mg Oral  Once 04/20/22 0747 04/20/22 0941   04/19/22 1645  vancomycin (VANCOREADY) IVPB 750 mg/150 mL  Status:  Discontinued        750 mg 150 mL/hr over 60 Minutes Intravenous  Once 04/19/22 1552 04/19/22 1604   04/19/22 1645  vancomycin (VANCOREADY) IVPB 750 mg/150 mL        750 mg 150 mL/hr over 60 Minutes Intravenous  Once 04/19/22 1605 04/19/22 1753   04/19/22 1600  ceFEPIme (MAXIPIME) 2 g in sodium chloride 0.9 % 100 mL IVPB  Status:  Discontinued        2 g 200 mL/hr over 30 Minutes Intravenous Every 8 hours 04/19/22 1441 04/19/22 1545   04/19/22 1600  ceFEPIme (MAXIPIME) 2 g in sodium chloride 0.9 % 100 mL IVPB  Status:  Discontinued        2 g 200 mL/hr over 30 Minutes Intravenous Every 12 hours 04/19/22 1545 04/25/22 1343   04/19/22 1530  vancomycin (VANCOCIN) IVPB 1000 mg/200 mL premix  Status:  Discontinued        1,000 mg 200 mL/hr over 60 Minutes Intravenous  Once 04/19/22 1441 04/19/22 1544   04/16/22 1800  Ampicillin-Sulbactam (UNASYN) 3 g in sodium chloride 0.9 % 100 mL IVPB  Status:  Discontinued        3 g 200 mL/hr over 30 Minutes Intravenous Every 6 hours 04/16/22 1139 04/19/22 1421   04/16/22 1000  Ampicillin-Sulbactam (UNASYN) 3 g in sodium chloride 0.9 % 100 mL IVPB  Status:  Discontinued        3 g 200 mL/hr over 30 Minutes Intravenous Every 6 hours 04/16/22 0935 04/16/22 1139       Objective: Vitals:   04/29/22 0739 04/29/22 1054  BP: 91/63 (!) 78/51  Pulse: 73 65  Resp: 19 18  Temp: (!) 97.3 F (36.3 C) 97.7 F (36.5 C)  SpO2: 98% 100%    Intake/Output Summary (Last 24 hours) at 04/29/2022 1122 Last data filed at 04/29/2022 0644 Gross per 24 hour  Intake 420  ml  Output 1125 ml  Net -705 ml    Filed Weights   04/27/22 0433 04/28/22 0441 04/29/22 0601  Weight: 36.8 kg 36.9 kg 36.8 kg   Weight change: -0.136 kg Body mass index is 14.37 kg/m.   Physical Exam: General exam: Thin built middle-aged Caucasian female.  Not  in physical distress Skin: No rashes, lesions or ulcers. HEENT: Atraumatic, normocephalic, no obvious bleeding Lungs: Clear to auscultation bilaterally.  Left chest wall with PleurX catheter draining to Pleur-evac CVS: Regular rate and rhythm, no murmur GI/Abd soft, nontender, nondistended, bowel sound present CNS: Alert, awake, oriented x3 Psychiatry: Frustrated because of long stay Extremities: No pedal edema, no calf tenderness  Data Review: I have personally reviewed the laboratory data and studies available.  F/u labs ordered Unresulted Labs (From admission, onward)     Start     Ordered   04/30/22 0500  Ammonia  Tomorrow morning,   R       Question:  Specimen collection method  Answer:  Unit=Unit collect   04/29/22 0817   04/29/22 0500  CBC with Differential/Platelet  Daily,   R     Question:  Specimen collection method  Answer:  Unit=Unit collect   04/28/22 1255   04/29/22 2703  Basic metabolic panel  Daily,   R     Question:  Specimen collection method  Answer:  Unit=Unit collect   04/28/22 1255   04/21/22 0500  Creatinine, serum  (enoxaparin (LOVENOX)    CrCl >/= 30 ml/min)  Weekly,   R     Comments: while on enoxaparin therapy    04/14/22 2226            Signed, Terrilee Croak, MD [facility] 04/29/2022

## 2022-04-29 NOTE — Progress Notes (Signed)
Mobility Specialist Progress Note    04/29/22 1202  Mobility  Activity Ambulated with assistance in hallway  Level of Assistance Standby assist, set-up cues, supervision of patient - no hands on  Assistive Device Front wheel walker  Distance Ambulated (ft) 470 ft  Activity Response Tolerated well  $Mobility charge 1 Mobility   Pre-Mobility: 79 HR, BP, 100% SpO2 During Mobility: 94 HR  Pt received in chair and agreeable. No complaints on walk. Returned to chair with call bell in reach.    Hildred Alamin Mobility Specialist  Primary: 5N M.S. Phone: [phone] Secondary: 6N M.S. Phone: [phone]

## 2022-04-29 NOTE — TOC Progression Note (Addendum)
Transition of Care [facility]) - Progression Note    Patient Details  Name: Theresa Rocha MRN: [id number] Date of Birth: 1966-01-29  Transition of Care [facility]) CM/SW [place], RN Phone Number: 04/29/2022, 10:15 AM  Clinical Narrative:     Continue to try to find RN services for [facility]. Patient location, pleurex drain and staffing availability are barriers.  Called [place] love- [facility]- do not take pleurx drains [facility] still cannot take due to staffing [facility], awaiting call back  1120 [facility] cannot do [place]- Do not do pleurex [facility]- pending 6/2  Re messaged Caryl Pina from [place] staff [facility]- does not do plurex [place]- does not go to [employer] reponse.  Will continue to update  and follow for needs, recommendations,and transitions.  Expected Discharge Plan: [place] Barriers to Discharge: No Barriers Identified  Expected Discharge Plan and Services Expected Discharge Plan: [place]   Discharge Planning Services: CM Consult Post Acute Care Choice: [place] arrangements for the past 2 months: Single Family Home Expected Discharge Date: 04/26/22                 DME Agency: NA       HH Arranged: RN [place] Agency: [place] Date [facility] Agency Contacted: 04/26/22 Time HH Agency Contacted: 15 Representative spoke with at [place]: [place] Determinants of Health ([place]) Interventions    Readmission Risk Interventions     View : No data to display.

## 2022-04-30 ENCOUNTER — Encounter: Payer: BC Managed Care – PPO | Admitting: Thoracic Surgery (Cardiothoracic Vascular Surgery)

## 2022-04-30 ENCOUNTER — Inpatient Hospital Stay (HOSPITAL_COMMUNITY): Payer: BC Managed Care – PPO

## 2022-04-30 DIAGNOSIS — J9621 Acute and chronic respiratory failure with hypoxia: Secondary | ICD-10-CM | POA: Diagnosis not present

## 2022-04-30 LAB — CBC WITH DIFFERENTIAL/PLATELET
Abs Immature Granulocytes: 0.05 10*3/uL (ref 0.00–0.07)
Basophils Absolute: 0.1 10*3/uL (ref 0.0–0.1)
Basophils Relative: 1 %
Eosinophils Absolute: 0.2 10*3/uL (ref 0.0–0.5)
Eosinophils Relative: 3 %
HCT: 31.3 % — ABNORMAL LOW (ref 36.0–46.0)
Hemoglobin: 9.9 g/dL — ABNORMAL LOW (ref 12.0–15.0)
Immature Granulocytes: 1 %
Lymphocytes Relative: 13 %
Lymphs Abs: 0.9 10*3/uL (ref 0.7–4.0)
MCH: 24.6 pg — ABNORMAL LOW (ref 26.0–34.0)
MCHC: 31.6 g/dL (ref 30.0–36.0)
MCV: 77.9 fL — ABNORMAL LOW (ref 80.0–100.0)
Monocytes Absolute: 1.2 10*3/uL — ABNORMAL HIGH (ref 0.1–1.0)
Monocytes Relative: 18 %
Neutro Abs: 4.5 10*3/uL (ref 1.7–7.7)
Neutrophils Relative %: 64 %
Platelets: 124 10*3/uL — ABNORMAL LOW (ref 150–400)
RBC: 4.02 MIL/uL (ref 3.87–5.11)
RDW: 23.5 % — ABNORMAL HIGH (ref 11.5–15.5)
WBC: 6.9 10*3/uL (ref 4.0–10.5)
nRBC: 0 % (ref 0.0–0.2)

## 2022-04-30 LAB — BASIC METABOLIC PANEL
Anion gap: 6 (ref 5–15)
BUN: 36 mg/dL — ABNORMAL HIGH (ref 6–20)
CO2: 24 mmol/L (ref 22–32)
Calcium: 8.1 mg/dL — ABNORMAL LOW (ref 8.9–10.3)
Chloride: 102 mmol/L (ref 98–111)
Creatinine, Ser: 1.19 mg/dL — ABNORMAL HIGH (ref 0.44–1.00)
GFR, Estimated: 54 mL/min — ABNORMAL LOW (ref >60)
Glucose, Bld: 112 mg/dL — ABNORMAL HIGH (ref 70–99)
Potassium: 5.4 mmol/L — ABNORMAL HIGH (ref 3.5–5.1)
Sodium: 132 mmol/L — ABNORMAL LOW (ref 135–145)

## 2022-04-30 LAB — AMMONIA: Ammonia: 74 umol/L — ABNORMAL HIGH (ref 9–35)

## 2022-04-30 MED ORDER — OCTREOTIDE ACETATE 100 MCG/ML IJ SOLN
200.0000 ug | Freq: Three times a day (TID) | INTRAMUSCULAR | Status: DC
Start: 2022-04-30 — End: 2022-05-03
  Administered 2022-04-30 – 2022-05-02 (×8): 200 ug via SUBCUTANEOUS
  Filled 2022-04-30 (×11): qty 2

## 2022-04-30 MED ORDER — METOCLOPRAMIDE HCL 5 MG/ML IJ SOLN
5.0000 mg | Freq: Two times a day (BID) | INTRAMUSCULAR | Status: DC | PRN
  Administered 2022-04-30: 5 mg via INTRAVENOUS
  Filled 2022-04-30: qty 2

## 2022-04-30 MED ORDER — PATIROMER SORBITEX CALCIUM 8.4 G PO PACK
8.4000 g | PACK | Freq: Once | ORAL | Status: AC
  Administered 2022-04-30: 8.4 g via ORAL
  Filled 2022-04-30: qty 1

## 2022-04-30 MED ORDER — POLYETHYLENE GLYCOL 3350 17 G PO PACK: 17.0000 g | PACK | Freq: Every day | ORAL | Status: DC | PRN

## 2022-04-30 NOTE — TOC Progression Note (Addendum)
Transition of Care [facility]) - Progression Note    Patient Details  Name: SEMIRA STOLTZFUS MRN: [id number] Date of Birth: 1966/05/24  Transition of Care [facility]) CM/SW [place], RN Phone Number: 04/30/2022, 3:21 PM  Clinical Narrative:     Have called all local agencies, cannot find a agency that does Pluerex catheters, takes BCBS and goes to [place]. Patient is high risk for re-admission  Spoke to patient regarding continued serach for a Therapist, sports . The patient states hse has had the pleurex for a while and does not need a Therapist, sports. Is teaching her daughter about it, so her husband does it in the evening and her daughter will assist her in the morning. She voices concern of return home. Discussed with provider.  Told him that she did not want a HH RN.   Ordered Rolator with seat ( 4 wheel ) for patient as requested.  Expected Discharge Plan: [place] Barriers to Discharge: No Barriers Identified  Expected Discharge Plan and Services Expected Discharge Plan: [place]   Discharge Planning Services: CM Consult Post Acute Care Choice: [place] arrangements for the past 2 months: Single Family Home Expected Discharge Date: 04/26/22                 DME Agency: NA       HH Arranged: RN [place] Agency: [place] Date [facility] Agency Contacted: 04/26/22 Time HH Agency Contacted: 17 Representative spoke with at [place]: [place] Determinants of Health ([place]) Interventions    Readmission Risk Interventions     View : No data to display.

## 2022-04-30 NOTE — Progress Notes (Signed)
PROGRESS NOTE  Theresa Rocha  DOB: [birth year]  PCP: Seward Carol, MD WLN:[id number]  DOA: 04/14/2022  LOS: 68 days  Hospital Day: 17  Brief narrative: Theresa Rocha is a 56 y.o. female with PMH significant for right sided breast cancer, hepatocellular cancer, chronic hepatitis C, nonalcoholic liver cirrhosis, portal hypertension, esophageal varices, lactose intolerance, IBS, chronic anemia, thrombocytopenia, COPD who also has a Pleurx catheter on the left side because of recurrent pleural effusion. Patient was sent to the ED on 5/17 for low sodium level of 119. Patient was admitted to hospitalist service.   Her hospital course was also complicated by hyperkalemia, AKI. She was seen by nephrology, CT surgery, oncology. See below for details.   Subjective: Patient was seen and examined this morning.  Sitting up in bed.  Not in distress.  Husband at bedside. Labs from this morning shows improving creatinine, 1.9 today.  Principal Problem:   Acute on chronic respiratory failure with hypoxia (HCC) Active Problems:   Hyponatremia   SIRS (systemic inflammatory response syndrome) (HCC)   Hyperkalemia   Decompensated HCV cirrhosis (HCC)   Pleural effusion on left   Hypotension   Hepatocellular carcinoma (HCC)   Thrombocytopenia (HCC)   Anemia   Breast cancer (HCC)   Protein-calorie malnutrition, severe (HCC)    Assessment and Plan: Acute on chronic hyponatremia -Chronic hyponatremia due to hypovolemia related to liver cirrhosis and portal hypertension.  Patient was initially admitted with a very low sodium level 119.  Nephrology consult was obtained.  She was started on midodrine, IV albumin.  Sodium level eventually improved, stable at 132 for last 2 days. Recent Labs  Lab 04/24/22 0522 04/25/22 0430 04/26/22 1048 04/26/22 1201 04/27/22 0400 04/28/22 0630 04/29/22 0340 04/30/22 0330  NA 129* 131* 125* 126* 129* 126* 132* 132*    AKI -Baseline creatinine normal.  Since  5/28, her creatinine started to worsen, peaked on 5/30 at 1.89.  Gradually improving, 1.19 today.  Off Lasix.  Patient likely has hepatorenal syndrome.  Continue octreotide.  Nephrology following.  Noted a plan to increase the dose of octreotide for Saturday and monitor creatinine level on Sunday.  Cardiac creatinine is less than 1. Recent Labs    04/22/22 0430 04/23/22 0441 04/24/22 0522 04/25/22 0430 04/26/22 1048 04/26/22 1201 04/27/22 0400 04/28/22 0425 04/28/22 0630 04/29/22 0340 04/30/22 0330  BUN 19 20 25* 30* 41* 43* 48*  --  48* 45* 36*  CREATININE 0.70 0.77 0.84 1.06* 1.55* 1.66* 1.89* 1.82* 1.63* 1.42* 1.19*    Hypotension -Blood pressure chronically low.  Currently on Midodrine '15mg'$  TID.  Status post albumin for 48 hours.  Also on octreotide 100 mcg 3 times daily.  Recurrent pleural effusion -Patient states her pleural effusion started after microaspiration liver lesions in October 2022.  She required multiple thoracentesis and has left Pleurx catheter was placed in March 2023. -Last seen by CT surgery on 5/24.  Currently her Pleurx catheter is connected to Pleur-evac. -Per GI note from 5/19, "no plans for TIPS at this time, per IR Dr. Kathlene Cote, absolute last resort as there is no guarantee that TIPS will help."  Liver cirrhosis secondary to hepatitis C and alcohol use Portal hypertension, ascites History of esophageal varices and portal gastropathy History of upper GI bleeding -Follows up with [facility]. -ID UGI bleed in November 2022, varices were banded. -Patient was previously on transplant list but was diagnosed with breast cancer and had lumpectomy in November 2022 and hence no longer a transplant  candidate. -Continue Protonix.  Hemoglobin stable Recent Labs    04/19/22 0507 04/20/22 0347 04/21/22 0430 04/22/22 0430 04/26/22 1048 04/26/22 1200 04/27/22 0400 04/28/22 0630 04/29/22 0340 04/30/22 0330  HGB 9.0* 9.4* 9.3* 9.2* 10.6* 10.4* 10.0* 9.5* 9.7*  9.9*     Hyperkalemia -K level elevated for last 3 days. Unable to tolerate Lokelma.  Noted a plan for nephrology to try 1 dose of Veltassa today. Recent Labs  Lab 04/24/22 0522 04/25/22 0430 04/26/22 2000 04/27/22 0400 04/28/22 0630 04/29/22 0340 04/30/22 0330  K 4.4   < > 5.0 5.0 5.3* 5.2* 5.4*  MG 2.2  --   --   --   --   --   --   PHOS  --   --   --   --  4.0  --   --    < > = values in this interval not displayed.    Anemia of chronic disease -Hemoglobin stable to more than 9.  Continue to monitor. Recent Labs    05/01/21 1114 09/01/21 1316 04/24/22 0522 04/26/22 1048 04/26/22 1200 04/27/22 0400 04/28/22 0630 04/29/22 0340 04/30/22 0330  HGB  --    < >  --    < > 10.4* 10.0* 9.5* 9.7* 9.9*  MCV  --    < >  --    < > 76.7* 75.6* 77.3* 77.6* 77.9*  FERRITIN  --   --  98  --   --   --   --   --   --   TIBC 451*  --  181*  --   --   --   --   --   --   IRON 37*  --  14*  --   --   --   --   --   --    < > = values in this interval not displayed.    Chronic thrombocytopenia  -Secondary to liver cirrhosis -Stable.  No bleeding Recent Labs  Lab 04/26/22 1048 04/26/22 1200 04/27/22 0400 04/28/22 0630 04/29/22 0340 04/30/22 0330  PLT 143* PLATELET CLUMPS NOTED ON SMEAR, COUNT APPEARS ADEQUATE 118* 107* 118* 124*     History of breast cancer History of HCC -Seen by [name] while in the hospital. -Noted a plan to repeat her abdominal MRI after discharge for [facility] follow up  Goals of care   Code Status: Full Code    Mobility: Encourage ambulation  Skin assessment:     Nutritional status:  Body mass index is 14.18 kg/m.  Nutrition Problem: Severe Malnutrition Etiology: chronic illness (cancer, cirrhosis) Signs/Symptoms: severe muscle depletion, severe fat depletion     Diet:  Diet Order             Diet regular Room service appropriate? Yes with Assist; Fluid consistency: Thin  Diet effective now                   DVT prophylaxis:   enoxaparin (LOVENOX) injection 30 mg Start: 04/14/22 2230   Antimicrobials: None Fluid: None Consultants: Nephrology Family Communication: Husband at bedside  Status is: Inpatient  Continue in-hospital care because: Hypotension, AKI, abnormal electrolytes.  Unable to do octreotide as an outpatient Level of care: Progressive   Dispo: The patient is from: Home              Anticipated d/c is to: Home, in 2 to 3 days likely  Patient currently is not medically stable to d/c.   Difficult to place patient No     Infusions:    Scheduled Meds:  Chlorhexidine Gluconate Cloth  6 each Topical Daily   enoxaparin (LOVENOX) injection  30 mg Subcutaneous Q24H   feeding supplement (KATE FARMS STANDARD 1.4)  325 mL Oral Daily   lidocaine  1 patch Transdermal Q24H   magnesium oxide  400 mg Oral BID   midodrine  15 mg Oral TID WC   multivitamin with minerals  1 tablet Oral Daily   nicotine  21 mg Transdermal Daily   octreotide  200 mcg Subcutaneous TID   pantoprazole  40 mg Oral Daily   patiromer  8.4 g Oral Once   sodium chloride flush  10-40 mL Intracatheter Q12H   sodium chloride flush  3 mL Intravenous Q12H   tamoxifen  20 mg Oral Daily    PRN meds: acetaminophen **OR** acetaminophen, albuterol, baclofen, dicyclomine, guaiFENesin-dextromethorphan, hydrOXYzine, magic mouthwash w/lidocaine, nicotine polacrilex, ondansetron (ZOFRAN) IV, oxyCODONE, phenol, prochlorperazine, senna-docusate, sodium chloride flush   Antimicrobials: Anti-infectives (From admission, onward)    Start     Dose/Rate Route Frequency Ordered Stop   04/20/22 1600  vancomycin (VANCOCIN) IVPB 1000 mg/200 mL premix  Status:  Discontinued        1,000 mg 200 mL/hr over 60 Minutes Intravenous Every 24 hours 04/19/22 1444 04/19/22 1544   04/20/22 1400  vancomycin (VANCOREADY) IVPB 500 mg/100 mL  Status:  Discontinued        500 mg 100 mL/hr over 60 Minutes Intravenous Every 24 hours 04/19/22 1604  04/20/22 1145   04/20/22 0845  fluconazole (DIFLUCAN) tablet 150 mg        150 mg Oral  Once 04/20/22 0747 04/20/22 0941   04/19/22 1645  vancomycin (VANCOREADY) IVPB 750 mg/150 mL  Status:  Discontinued        750 mg 150 mL/hr over 60 Minutes Intravenous  Once 04/19/22 1552 04/19/22 1604   04/19/22 1645  vancomycin (VANCOREADY) IVPB 750 mg/150 mL        750 mg 150 mL/hr over 60 Minutes Intravenous  Once 04/19/22 1605 04/19/22 1753   04/19/22 1600  ceFEPIme (MAXIPIME) 2 g in sodium chloride 0.9 % 100 mL IVPB  Status:  Discontinued        2 g 200 mL/hr over 30 Minutes Intravenous Every 8 hours 04/19/22 1441 04/19/22 1545   04/19/22 1600  ceFEPIme (MAXIPIME) 2 g in sodium chloride 0.9 % 100 mL IVPB  Status:  Discontinued        2 g 200 mL/hr over 30 Minutes Intravenous Every 12 hours 04/19/22 1545 04/25/22 1343   04/19/22 1530  vancomycin (VANCOCIN) IVPB 1000 mg/200 mL premix  Status:  Discontinued        1,000 mg 200 mL/hr over 60 Minutes Intravenous  Once 04/19/22 1441 04/19/22 1544   04/16/22 1800  Ampicillin-Sulbactam (UNASYN) 3 g in sodium chloride 0.9 % 100 mL IVPB  Status:  Discontinued        3 g 200 mL/hr over 30 Minutes Intravenous Every 6 hours 04/16/22 1139 04/19/22 1421   04/16/22 1000  Ampicillin-Sulbactam (UNASYN) 3 g in sodium chloride 0.9 % 100 mL IVPB  Status:  Discontinued        3 g 200 mL/hr over 30 Minutes Intravenous Every 6 hours 04/16/22 0935 04/16/22 1139       Objective: Vitals:   04/30/22 0407 04/30/22 0815  BP: (!) 87/53 (!) 87/65  Pulse: 81 83  Resp: 20 20  Temp: 98 F (36.7 C)   SpO2: 96% 96%    Intake/Output Summary (Last 24 hours) at 04/30/2022 1156 Last data filed at 04/30/2022 0900 Gross per 24 hour  Intake 480 ml  Output 1070 ml  Net -590 ml    Filed Weights   04/28/22 0441 04/29/22 0601 04/30/22 0407  Weight: 36.9 kg 36.8 kg 36.3 kg   Weight change: -0.487 kg Body mass index is 14.18 kg/m.   Physical Exam: General exam: Thin built  middle-aged Caucasian female.  Not in physical distress Skin: No rashes, lesions or ulcers. HEENT: Atraumatic, normocephalic, no obvious bleeding Lungs: Clear to auscultation bilaterally.  Left chest wall with PleurX catheter draining to Pleur-evac CVS: Regular rate and rhythm, no murmur GI/Abd soft, nontender, nondistended, bowel sound present CNS: Alert, awake, oriented x3 Psychiatry: Frustrated because of long stay Extremities: No pedal edema, no calf tenderness  Data Review: I have personally reviewed the laboratory data and studies available.  F/u labs ordered Unresulted Labs (From admission, onward)     Start     Ordered   04/29/22 0500  CBC with Differential/Platelet  Daily,   R     Question:  Specimen collection method  Answer:  Unit=Unit collect   04/28/22 1255   04/29/22 7517  Basic metabolic panel  Daily,   R     Question:  Specimen collection method  Answer:  Unit=Unit collect   04/28/22 1255   04/21/22 0500  Creatinine, serum  (enoxaparin (LOVENOX)    CrCl >/= 30 ml/min)  Weekly,   R     Comments: while on enoxaparin therapy    04/14/22 2226            Signed, Terrilee Croak, MD [facility] 04/30/2022

## 2022-04-30 NOTE — Progress Notes (Signed)
Mobility Specialist Progress Note    04/30/22 1650  Mobility  Activity Ambulated with assistance in hallway  Level of Assistance Contact guard assist, steadying assist  Assistive Device Front wheel walker  Distance Ambulated (ft) 240 ft  Activity Response Tolerated well  $Mobility charge 1 Mobility   Pre-mobility: 93 HR, 96% SpO2  Pt received in bed and agreeable. C/o nausea and weakness. Returned to sitting EOB with call bell in reach.   Hildred Alamin Mobility Specialist

## 2022-04-30 NOTE — Progress Notes (Signed)
[facility] Progress Note  Background: Reconsulted for AKI. Originally consulted for hyponatremia. 56 year old female with a history of right-sided breast cancer, hepatocellular carcinoma, cirrhosis secondary to HCV, recurrent left pleural effusion who was transferred here from [facility] for management of her recurrent effusion.  Had multiple thoracentesis and had a Pleurx catheter placed.  We were originally consulted for hyponatremia which is likely related to patient's cirrhosis and portal hypertension.  She was started on midodrine and will was started on IV albumin as well initially.  We had signed off on 5/21 as her sodium did improve to 133. We are being reconsulted for AKI.  Her creatinine has been around 0.5-0.6.  Started to progressively rise on 5/27, now up to 1.89 on 5/30.  Her last dose of Lasix was on 5/29.  Has been maintained on midodrine.  She did receive albumin on 5/29 x 2 doses then 1 more dose on 5/30.  Blood pressures have been low since 5/29.  She did also have an issue of hyperkalemia which improved with Lokelma.  Subjective: Patient seen and examined bedside along with hospitalist. No acute events overnight. Feels weak from her prolonged hospital stay. Wants to move around more and walk at least a few times a day. Husband at bedside   Vitals:   04/29/22 2007 04/29/22 2348 04/30/22 0407 04/30/22 0815  BP: 102/66 (!) 84/53 (!) 87/53 (!) 87/65  Pulse: 67 73 81 83  Resp: '15 15 20 20  '$ Temp: 97.7 F (36.5 C) 97.8 F (36.6 C) 98 F (36.7 C)   TempSrc: Oral Oral Oral   SpO2: 97% 92% 96% 96%  Weight:   36.3 kg   Height:        Exam: Gen: alert, NAD, thin and frail, sitting up in bed Cardiac: RRR, s1s2 Resp: diminished air entry left base, normal WOB, left pleurex in place, unlabored Abd: soft, nt/nd Ext: no edema Neuro: awake, alert, speech clear and coherent, moves all ext spontaneously     Recent Labs  Lab 04/28/22 0630 04/29/22 0340  04/30/22 0330  HGB 9.5* 9.7* 9.9*  ALBUMIN 3.4*  --   --   CALCIUM 8.4* 8.2* 8.1*  PHOS 4.0  --   --   CREATININE 1.63* 1.42* 1.19*  K 5.3* 5.2* 5.4*   Inpatient medications:  Chlorhexidine Gluconate Cloth  6 each Topical Daily   enoxaparin (LOVENOX) injection  30 mg Subcutaneous Q24H   feeding supplement (KATE FARMS STANDARD 1.4)  325 mL Oral Daily   lidocaine  1 patch Transdermal Q24H   magnesium oxide  400 mg Oral BID   midodrine  15 mg Oral TID WC   multivitamin with minerals  1 tablet Oral Daily   nicotine  21 mg Transdermal Daily   octreotide  200 mcg Subcutaneous TID   pantoprazole  40 mg Oral Daily   patiromer  8.4 g Oral Once   sodium chloride flush  10-40 mL Intracatheter Q12H   sodium chloride flush  3 mL Intravenous Q12H   tamoxifen  20 mg Oral Daily     acetaminophen **OR** acetaminophen, albuterol, baclofen, dicyclomine, guaiFENesin-dextromethorphan, hydrOXYzine, magic mouthwash w/lidocaine, nicotine polacrilex, ondansetron (ZOFRAN) IV, oxyCODONE, phenol, prochlorperazine, senna-docusate, sodium chloride flush   Assessment/ Plan: AKI : secondary to HRS--UA bland, renal u/s unremarkable, UNa <10. Baseline Cr ~0.5 Cr better, down to 1.2 today Continue to hold lasix, s/p albumin x 48 hrs, on midodrine '15mg'$  TID. Will increase octreotide subq to 29mg TID. No options to do  octreotide as an outpatient. Understandably, she wants to go home but I did explain to her that we need to do this as safely as possible. My tentative plan is that if her kidney function is better with a Cr <1 w/ increase in octreotide dose on Saturday then, I'll hold her octreotide and see how her kidney function is on Sunday. Her and her husband are agreeable to this tentative plan. Avoid nephrotoxic medications including NSAIDs and iodinated intravenous contrast exposure unless the latter is absolutely indicated.  Preferred narcotic agents for pain control are hydromorphone, fentanyl, and methadone.  Morphine should not be used. Avoid Baclofen and avoid oral sodium phosphate and magnesium citrate based laxatives / bowel preps. Continue strict Input and Output monitoring. Will monitor the patient closely with you and intervene or adjust therapy as indicated by changes in clinical status/labs  Hyponatremia - in a pt w/ cirrhosis and portal HTN. Treatment of hyponatremia in cirrhotics is unique in that the main goal is to improve blood pressures, which should then improve renal perfusion and therefore water clearance. Na is now stable, would not expect to be WNL range. HypoNa in itself carries a poor prognosis in the context of cirrhosis. Encouraged solute intake (her PO intake is poor to begin with, hence her diet has been liberalized), HRS plan as above. Na stable 132 Acute on chronic HRF, recurrent left pleural effusion - transudate effusion s/p pleurex catheter. Incentive spirometer ordered Decompensated Cirrhosis - Per review of her chart in care everywhere, she does follow with transplant hepatology and is not a candidate for liver transplant given her underlying [place] and invasive breast Ca Hypotension - midodrine now '15mg'$  tid and octreotide 193mg tid Hyperkalemia-had an issue with lokelma and vomiting. Will try for veltassa x 1 dose--mix in apple sauce. Advised her to limit K in diet (especially orange juice) HCC - sp radiation implant H/o breast cancer [place] [facility] [place]

## 2022-05-01 DIAGNOSIS — J9621 Acute and chronic respiratory failure with hypoxia: Secondary | ICD-10-CM | POA: Diagnosis not present

## 2022-05-01 LAB — BASIC METABOLIC PANEL
Anion gap: 5 (ref 5–15)
BUN: 39 mg/dL — ABNORMAL HIGH (ref 6–20)
CO2: 23 mmol/L (ref 22–32)
Calcium: 8.4 mg/dL — ABNORMAL LOW (ref 8.9–10.3)
Chloride: 103 mmol/L (ref 98–111)
Creatinine, Ser: 1.41 mg/dL — ABNORMAL HIGH (ref 0.44–1.00)
GFR, Estimated: 44 mL/min — ABNORMAL LOW (ref >60)
Glucose, Bld: 92 mg/dL (ref 70–99)
Potassium: 6 mmol/L — ABNORMAL HIGH (ref 3.5–5.1)
Sodium: 131 mmol/L — ABNORMAL LOW (ref 135–145)

## 2022-05-01 LAB — CBC WITH DIFFERENTIAL/PLATELET
Abs Immature Granulocytes: 0.02 10*3/uL (ref 0.00–0.07)
Basophils Absolute: 0.1 10*3/uL (ref 0.0–0.1)
Basophils Relative: 1 %
Eosinophils Absolute: 0.2 10*3/uL (ref 0.0–0.5)
Eosinophils Relative: 2 %
HCT: 31 % — ABNORMAL LOW (ref 36.0–46.0)
Hemoglobin: 10.2 g/dL — ABNORMAL LOW (ref 12.0–15.0)
Immature Granulocytes: 0 %
Lymphocytes Relative: 13 %
Lymphs Abs: 1.1 10*3/uL (ref 0.7–4.0)
MCH: 25.1 pg — ABNORMAL LOW (ref 26.0–34.0)
MCHC: 32.9 g/dL (ref 30.0–36.0)
MCV: 76.2 fL — ABNORMAL LOW (ref 80.0–100.0)
Monocytes Absolute: 1.2 10*3/uL — ABNORMAL HIGH (ref 0.1–1.0)
Monocytes Relative: 15 %
Neutro Abs: 5.5 10*3/uL (ref 1.7–7.7)
Neutrophils Relative %: 69 %
Platelets: 134 10*3/uL — ABNORMAL LOW (ref 150–400)
RBC: 4.07 MIL/uL (ref 3.87–5.11)
RDW: 23.9 % — ABNORMAL HIGH (ref 11.5–15.5)
WBC: 8 10*3/uL (ref 4.0–10.5)
nRBC: 0 % (ref 0.0–0.2)

## 2022-05-01 MED ORDER — SODIUM ZIRCONIUM CYCLOSILICATE 10 G PO PACK
10.0000 g | PACK | Freq: Two times a day (BID) | ORAL | Status: AC
Start: 2022-05-01 — End: 2022-05-01
  Administered 2022-05-01 (×2): 10 g via ORAL
  Filled 2022-05-01 (×3): qty 1

## 2022-05-01 MED ORDER — LACTULOSE 10 GM/15ML PO SOLN
10.0000 g | Freq: Three times a day (TID) | ORAL | Status: DC
  Administered 2022-05-01 – 2022-05-03 (×5): 10 g via ORAL
  Filled 2022-05-01 (×6): qty 15

## 2022-05-01 MED ORDER — ALBUMIN HUMAN 5 % IV SOLN
12.5000 g | Freq: Four times a day (QID) | INTRAVENOUS | Status: AC
  Administered 2022-05-01 – 2022-05-03 (×8): 12.5 g via INTRAVENOUS
  Filled 2022-05-01 (×8): qty 250

## 2022-05-01 NOTE — Plan of Care (Signed)
  Problem: Education: Goal: Knowledge of General Education information will improve Description: Including pain rating scale, medication(s)/side effects and non-pharmacologic comfort measures Outcome: Progressing   Problem: Clinical Measurements: Goal: Respiratory complications will improve Outcome: Progressing   Problem: Nutrition: Goal: Adequate nutrition will be maintained Outcome: Progressing   Problem: Coping: Goal: Level of anxiety will decrease Outcome: Progressing   Problem: Pain Managment: Goal: General experience of comfort will improve Outcome: Progressing   

## 2022-05-01 NOTE — Progress Notes (Signed)
Mobility Specialist Progress Note:   05/01/22 1122  Mobility  Activity Ambulated with assistance in hallway  Level of Assistance Standby assist, set-up cues, supervision of patient - no hands on  Assistive Device Four wheel walker  Distance Ambulated (ft) 450 ft  Activity Response Tolerated well  $Mobility charge 1 Mobility   Pt received in bed willing to participate in mobility. No complaints of pain. Left in bed with call bell in reach and all needs met.   Post mobility:113/74 BP  Theresa Rocha

## 2022-05-01 NOTE — Progress Notes (Signed)
[facility] Progress Note  Background: Reconsulted for AKI. Originally consulted for hyponatremia. 56 year old female with a history of right-sided breast cancer, hepatocellular carcinoma, cirrhosis secondary to HCV, recurrent left pleural effusion who was transferred here from [facility] for management of her recurrent effusion.  Had multiple thoracentesis and had a Pleurx catheter placed.  We were originally consulted for hyponatremia which is likely related to patient's cirrhosis and portal hypertension.  She was started on midodrine and will was started on IV albumin as well initially.  We had signed off on 5/21 as her sodium did improve to 133. We are being reconsulted for AKI.  Her creatinine has been around 0.5-0.6.  Started to progressively rise on 5/27, now up to 1.89 on 5/30.  Her last dose of Lasix was on 5/29.  Has been maintained on midodrine.  She did receive albumin on 5/29 x 2 doses then 1 more dose on 5/30.  Blood pressures have been low since 5/29.  She did also have an issue of hyperkalemia which improved with Lokelma.  Subjective: Patient seen and examined bedside.  Husband and daughter at bedside. Had a rough day yesterday, had episodes of vomiting, feels weaker.  Did not eat much.  Abdominal x-ray yesterday did reveal stool burden.  She does report constipation and nausea.  Potassium up to 6, sodium down to 131, creatinine up to 1.4 today   Vitals:   04/30/22 2339 04/30/22 2344 05/01/22 0425 05/01/22 0810  BP: (!) 78/50 (!) 89/55 (!) 87/52 (!) 86/59  Pulse: 64 (!) 59 81 77  Resp: (!) '28 15 20 20  '$ Temp: 98.2 F (36.8 C)  98.5 F (36.9 C) 98.6 F (37 C)  TempSrc: Oral  Oral Oral  SpO2: 98% 98% 99% 99%  Weight:   35.5 kg   Height:        Exam: Gen: alert, NAD, thin and frail, sitting up in bed Cardiac: RRR, s1s2 Resp: diminished air entry left base, normal WOB, left pleurex in place, unlabored Abd: soft, nt/nd Ext: no edema Neuro: awake, alert,  speech clear and coherent, moves all ext spontaneously     Recent Labs  Lab 04/28/22 0630 04/29/22 0340 04/30/22 0330 05/01/22 0430  HGB 9.5*   < > 9.9* 10.2*  ALBUMIN 3.4*  --   --   --   CALCIUM 8.4*   < > 8.1* 8.4*  PHOS 4.0  --   --   --   CREATININE 1.63*   < > 1.19* 1.41*  K 5.3*   < > 5.4* 6.0*   < > = values in this interval not displayed.   Inpatient medications:  Chlorhexidine Gluconate Cloth  6 each Topical Daily   enoxaparin (LOVENOX) injection  30 mg Subcutaneous Q24H   feeding supplement (KATE FARMS STANDARD 1.4)  325 mL Oral Daily   lidocaine  1 patch Transdermal Q24H   magnesium oxide  400 mg Oral BID   midodrine  15 mg Oral TID WC   multivitamin with minerals  1 tablet Oral Daily   nicotine  21 mg Transdermal Daily   octreotide  200 mcg Subcutaneous TID   pantoprazole  40 mg Oral Daily   sodium chloride flush  10-40 mL Intracatheter Q12H   sodium chloride flush  3 mL Intravenous Q12H   sodium zirconium cyclosilicate  10 g Oral BID   tamoxifen  20 mg Oral Daily    albumin human      acetaminophen **OR** acetaminophen, albuterol, baclofen,  dicyclomine, guaiFENesin-dextromethorphan, hydrOXYzine, magic mouthwash w/lidocaine, metoCLOPramide (REGLAN) injection, nicotine polacrilex, ondansetron (ZOFRAN) IV, oxyCODONE, phenol, polyethylene glycol, prochlorperazine, senna-docusate, sodium chloride flush   Assessment/ Plan: AKI : secondary to HRS--UA bland, renal u/s unremarkable, UNa <10. Baseline Cr ~0.5 Cr worsened, up to 1.4 Continue to hold lasix, s/p albumin x 48 hrs, on midodrine '15mg'$  TID.  Increased octreotide subq to 254mg TID on 6/2. No options to do octreotide as an outpatient. Given acute issues yesterday with vomiting and low p.o. intake along with rising creatinine.  Will start on albumin for today and tomorrow as she may have had some prerenal injury Had a lengthy/separate conversation with her husband regards to long-term plan if things do not turn  around.  Discussed that if labs worsen by tomorrow despite max medical management, would recommend getting palliative care involved to address further goals of care especially since she is not a liver transplant candidate and with underlying malignancy.  Understandably, this is an emotional time for them Avoid nephrotoxic medications including NSAIDs and iodinated intravenous contrast exposure unless the latter is absolutely indicated.  Preferred narcotic agents for pain control are hydromorphone, fentanyl, and methadone. Morphine should not be used. Avoid Baclofen and avoid oral sodium phosphate and magnesium citrate based laxatives / bowel preps. Continue strict Input and Output monitoring. Will monitor the patient closely with you and intervene or adjust therapy as indicated by changes in clinical status/labs  Hyponatremia - in a pt w/ cirrhosis and portal HTN. Treatment of hyponatremia in cirrhotics is unique in that the main goal is to improve blood pressures, which should then improve renal perfusion and therefore water clearance. Na is now stable, would not expect to be WNL range. HypoNa in itself carries a poor prognosis in the context of cirrhosis. Encouraged solute intake (her PO intake is poor to begin with, hence her diet has been liberalized), HRS plan as above. Na down to 130 Acute on chronic HRF, recurrent left pleural effusion - transudate effusion s/p pleurex catheter. Incentive spirometer ordered Decompensated Cirrhosis - Per review of her chart in care everywhere, she does follow with transplant hepatology and is not a candidate for liver transplant given her [place] Hypotension - midodrine now '15mg'$  tid and octreotide 2035m tid Hyperkalemia-had an issue with lokelma and vomiting.  [facility] along with antiemetics.  Discussed low K diet.  Her constipation can also be a contributing factor to her hyperkalemia HCC - sp radiation  implant H/o breast cancer - rx'd by lumpectomy in 2020 Constipation: Bowel regimen per primary service  ViGean QuintMD CaFort Stewart

## 2022-05-01 NOTE — Progress Notes (Signed)
PROGRESS NOTE  Theresa Rocha  DOB: 31-Mar-1966  PCP: Seward Carol, MD WPY:[id number]  DOA: 04/14/2022  LOS: 59 days  Hospital Day: 18  Brief narrative: Theresa Rocha is a 56 y.o. female with PMH significant for right sided breast cancer, hepatocellular cancer, chronic hepatitis C, nonalcoholic liver cirrhosis, portal hypertension, esophageal varices, lactose intolerance, IBS, chronic anemia, thrombocytopenia, COPD who also has a Pleurx catheter on the left side because of recurrent pleural effusion. Patient was sent to the ED on 5/17 for low sodium level of 119. Patient was admitted to hospitalist service.   Her hospital course was also complicated by hyperkalemia, AKI. She was seen by nephrology, CT surgery, oncology. See below for details.  Subjective: Patient was seen and examined this morning.  Sitting up in bed.  Not in distress.  Husband and daughter at bedside. Labs this morning with worsening creatinine  Principal Problem:   Acute on chronic respiratory failure with hypoxia (HCC) Active Problems:   Hyponatremia   SIRS (systemic inflammatory response syndrome) (HCC)   Hyperkalemia   Decompensated HCV cirrhosis (HCC)   Pleural effusion on left   Hypotension   Hepatocellular carcinoma (HCC)   Thrombocytopenia (HCC)   Anemia   Breast cancer (HCC)   Protein-calorie malnutrition, severe (HCC)    Assessment and Plan: Acute on chronic hyponatremia -Chronic hyponatremia due to hypovolemia related to liver cirrhosis and portal hypertension.  Patient was initially admitted with a very low sodium level 119.  Nephrology consult was obtained.  She was started on midodrine, IV albumin.  Sodium level eventually improved, currently roughly stable over 130. Recent Labs  Lab 04/25/22 0430 04/26/22 1048 04/26/22 1201 04/27/22 0400 04/28/22 0630 04/29/22 0340 04/30/22 0330 05/01/22 0430  NA 131* 125* 126* 129* 126* 132* 132* 131*    AKI -Baseline creatinine normal.  Since  5/28, her creatinine started to worsen, peaked on 5/30 at 1.89.  Patient was suspected to be suffering from hepatorenal syndrome.  She was started on octreotide after which creatinine improved as well as 1.19.  Creatinine is worsening again, 1.41 today.  Noted a plan from nephrologist to start albumin and continue octreotide for now.  Continue to monitor creatinine. Recent Labs    04/23/22 0441 04/24/22 0522 04/25/22 0430 04/26/22 1048 04/26/22 1201 04/27/22 0400 04/28/22 0425 04/28/22 0630 04/29/22 0340 04/30/22 0330 05/01/22 0430  BUN 20 25* 30* 41* 43* 48*  --  48* 45* 36* 39*  CREATININE 0.77 0.84 1.06* 1.55* 1.66* 1.89* 1.82* 1.63* 1.42* 1.19* 1.41*    Hypotension -Blood pressure chronically low.  Currently on Midodrine '15mg'$  TID.  Status post albumin for 48 hours.  Also on octreotide 200 mcg 3 times daily today.  Recurrent pleural effusion -Patient states her pleural effusion started after microaspiration liver lesions in October 2022.  She required multiple thoracentesis and has left Pleurx catheter was placed in March 2023. -Last seen by CT surgery on 5/24.  Currently her Pleurx catheter is connected to Pleur-evac. -Per GI note from 5/19, "no plans for TIPS at this time, per IR Dr. Kathlene Cote, absolute last resort as there is no guarantee that TIPS will help."  Liver cirrhosis secondary to hepatitis C and alcohol use Portal hypertension, ascites History of esophageal varices and portal gastropathy History of upper GI bleeding -Follows up with [facility]. -ID UGI bleed in November 2022, varices were banded. -Patient was previously on transplant list but was diagnosed with breast cancer and had lumpectomy in November 2022 and hence no longer  a transplant candidate. -Continue Protonix.  Hemoglobin stable Recent Labs    04/20/22 0347 04/21/22 0430 04/22/22 0430 04/26/22 1048 04/26/22 1200 04/27/22 0400 04/28/22 0630 04/29/22 0340 04/30/22 0330 05/01/22 0430  HGB 9.4*  9.3* 9.2* 10.6* 10.4* 10.0* 9.5* 9.7* 9.9* 10.2*      Hyperkalemia -K level continues to rise up.  6 today.  2 doses of Lokelma ordered by nephrology. Recent Labs  Lab 04/27/22 0400 04/28/22 0630 04/29/22 0340 04/30/22 0330 05/01/22 0430  K 5.0 5.3* 5.2* 5.4* 6.0*  PHOS  --  4.0  --   --   --     Anemia of chronic disease -Hemoglobin stable at more than 9.  Continue to monitor. Recent Labs    04/24/22 0522 04/26/22 1048 04/27/22 0400 04/28/22 0630 04/29/22 0340 04/30/22 0330 05/01/22 0430  HGB  --    < > 10.0* 9.5* 9.7* 9.9* 10.2*  MCV  --    < > 75.6* 77.3* 77.6* 77.9* 76.2*  FERRITIN 98  --   --   --   --   --   --   TIBC 181*  --   --   --   --   --   --   IRON 14*  --   --   --   --   --   --    < > = values in this interval not displayed.    Chronic thrombocytopenia  -Secondary to liver cirrhosis -Stable.  No bleeding Recent Labs  Lab 04/26/22 1048 04/26/22 1200 04/27/22 0400 04/28/22 0630 04/29/22 0340 04/30/22 0330 05/01/22 0430  PLT 143* PLATELET CLUMPS NOTED ON SMEAR, COUNT APPEARS ADEQUATE 118* 107* 118* 124* 134*     History of breast cancer History of HCC -Seen by [name] while in the hospital. -Noted a plan to repeat her abdominal MRI after discharge for [facility] follow up  Goals of care   Code Status: Full Code    Mobility: Encourage ambulation  Skin assessment:     Nutritional status:  Body mass index is 13.86 kg/m.  Nutrition Problem: Severe Malnutrition Etiology: chronic illness (cancer, cirrhosis) Signs/Symptoms: severe muscle depletion, severe fat depletion     Diet:  Diet Order             Diet regular Room service appropriate? Yes with Assist; Fluid consistency: Thin  Diet effective now                   DVT prophylaxis:  enoxaparin (LOVENOX) injection 30 mg Start: 04/14/22 2230   Antimicrobials: None Fluid: None Consultants: Nephrology Family Communication: Husband at bedside  Status is:  Inpatient  Continue in-hospital care because: Hypotension, AKI, abnormal electrolytes.  Unable to do octreotide as an outpatient.  AKI worsening Level of care: Progressive   Dispo: The patient is from: Home              Anticipated d/c is to: Home, in 2 to 3 days likely              Patient currently is not medically stable to d/c.   Difficult to place patient No     Infusions:   albumin human 12.5 g (05/01/22 1029)    Scheduled Meds:  Chlorhexidine Gluconate Cloth  6 each Topical Daily   enoxaparin (LOVENOX) injection  30 mg Subcutaneous Q24H   feeding supplement (KATE FARMS STANDARD 1.4)  325 mL Oral Daily   lactulose  10 g Oral TID   lidocaine  1 patch Transdermal Q24H   magnesium oxide  400 mg Oral BID   midodrine  15 mg Oral TID WC   multivitamin with minerals  1 tablet Oral Daily   nicotine  21 mg Transdermal Daily   octreotide  200 mcg Subcutaneous TID   pantoprazole  40 mg Oral Daily   sodium chloride flush  10-40 mL Intracatheter Q12H   sodium chloride flush  3 mL Intravenous Q12H   sodium zirconium cyclosilicate  10 g Oral BID   tamoxifen  20 mg Oral Daily    PRN meds: acetaminophen **OR** acetaminophen, albuterol, baclofen, dicyclomine, guaiFENesin-dextromethorphan, hydrOXYzine, magic mouthwash w/lidocaine, metoCLOPramide (REGLAN) injection, nicotine polacrilex, ondansetron (ZOFRAN) IV, oxyCODONE, phenol, polyethylene glycol, prochlorperazine, senna-docusate, sodium chloride flush   Antimicrobials: Anti-infectives (From admission, onward)    Start     Dose/Rate Route Frequency Ordered Stop   04/20/22 1600  vancomycin (VANCOCIN) IVPB 1000 mg/200 mL premix  Status:  Discontinued        1,000 mg 200 mL/hr over 60 Minutes Intravenous Every 24 hours 04/19/22 1444 04/19/22 1544   04/20/22 1400  vancomycin (VANCOREADY) IVPB 500 mg/100 mL  Status:  Discontinued        500 mg 100 mL/hr over 60 Minutes Intravenous Every 24 hours 04/19/22 1604 04/20/22 1145   04/20/22  0845  fluconazole (DIFLUCAN) tablet 150 mg        150 mg Oral  Once 04/20/22 0747 04/20/22 0941   04/19/22 1645  vancomycin (VANCOREADY) IVPB 750 mg/150 mL  Status:  Discontinued        750 mg 150 mL/hr over 60 Minutes Intravenous  Once 04/19/22 1552 04/19/22 1604   04/19/22 1645  vancomycin (VANCOREADY) IVPB 750 mg/150 mL        750 mg 150 mL/hr over 60 Minutes Intravenous  Once 04/19/22 1605 04/19/22 1753   04/19/22 1600  ceFEPIme (MAXIPIME) 2 g in sodium chloride 0.9 % 100 mL IVPB  Status:  Discontinued        2 g 200 mL/hr over 30 Minutes Intravenous Every 8 hours 04/19/22 1441 04/19/22 1545   04/19/22 1600  ceFEPIme (MAXIPIME) 2 g in sodium chloride 0.9 % 100 mL IVPB  Status:  Discontinued        2 g 200 mL/hr over 30 Minutes Intravenous Every 12 hours 04/19/22 1545 04/25/22 1343   04/19/22 1530  vancomycin (VANCOCIN) IVPB 1000 mg/200 mL premix  Status:  Discontinued        1,000 mg 200 mL/hr over 60 Minutes Intravenous  Once 04/19/22 1441 04/19/22 1544   04/16/22 1800  Ampicillin-Sulbactam (UNASYN) 3 g in sodium chloride 0.9 % 100 mL IVPB  Status:  Discontinued        3 g 200 mL/hr over 30 Minutes Intravenous Every 6 hours 04/16/22 1139 04/19/22 1421   04/16/22 1000  Ampicillin-Sulbactam (UNASYN) 3 g in sodium chloride 0.9 % 100 mL IVPB  Status:  Discontinued        3 g 200 mL/hr over 30 Minutes Intravenous Every 6 hours 04/16/22 0935 04/16/22 1139       Objective: Vitals:   05/01/22 1020 05/01/22 1115  BP: 95/70 113/74  Pulse: 77 78  Resp: 20 20  Temp: (!) 97.5 F (36.4 C)   SpO2: 99% 100%    Intake/Output Summary (Last 24 hours) at 05/01/2022 1432 Last data filed at 05/01/2022 1029 Gross per 24 hour  Intake 340 ml  Output 790 ml  Net -450 ml  Filed Weights   04/29/22 0601 04/30/22 0407 05/01/22 0425  Weight: 36.8 kg 36.3 kg 35.5 kg   Weight change: -0.8 kg Body mass index is 13.86 kg/m.   Physical Exam: General exam: Thin built middle-aged Caucasian  female.  Not in physical distress Skin: No rashes, lesions or ulcers. HEENT: Atraumatic, normocephalic, no obvious bleeding Lungs: Clear to auscultation bilaterally.  Left chest wall with PleurX catheter draining to Pleur-evac CVS: Regular rate and rhythm, no murmur GI/Abd soft, nontender, nondistended, bowel sound present CNS: Alert, awake, oriented x3. Psychiatry: Frustrated because of long stay Extremities: No pedal edema, no calf tenderness  Data Review: I have personally reviewed the laboratory data and studies available.  F/u labs ordered Unresulted Labs (From admission, onward)     Start     Ordered   05/02/22 3976  Basic metabolic panel  Tomorrow morning,   R       Question:  Specimen collection method  Answer:  Unit=Unit collect   05/01/22 1314   04/21/22 0500  Creatinine, serum  (enoxaparin (LOVENOX)    CrCl >/= 30 ml/min)  Weekly,   R     Comments: while on enoxaparin therapy    04/14/22 2226            Signed, Terrilee Croak, MD [facility] 05/01/2022

## 2022-05-02 DIAGNOSIS — J9621 Acute and chronic respiratory failure with hypoxia: Secondary | ICD-10-CM | POA: Diagnosis not present

## 2022-05-02 LAB — BASIC METABOLIC PANEL
Anion gap: 7 (ref 5–15)
BUN: 33 mg/dL — ABNORMAL HIGH (ref 6–20)
CO2: 24 mmol/L (ref 22–32)
Calcium: 8.7 mg/dL — ABNORMAL LOW (ref 8.9–10.3)
Chloride: 104 mmol/L (ref 98–111)
Creatinine, Ser: 1.14 mg/dL — ABNORMAL HIGH (ref 0.44–1.00)
GFR, Estimated: 57 mL/min — ABNORMAL LOW (ref >60)
Glucose, Bld: 103 mg/dL — ABNORMAL HIGH (ref 70–99)
Potassium: 5.1 mmol/L (ref 3.5–5.1)
Sodium: 135 mmol/L (ref 135–145)

## 2022-05-02 NOTE — Progress Notes (Signed)
PROGRESS NOTE  TAMAIYA BUMP  DOB: Apr 03, 1966  PCP: Seward Carol, MD WER:[id number]  DOA: 04/14/2022  LOS: 78 days  Hospital Day: 19  Brief narrative: Theresa Rocha is a 56 y.o. female with PMH significant for right sided breast cancer, hepatocellular cancer, chronic hepatitis C, nonalcoholic liver cirrhosis, portal hypertension, esophageal varices, lactose intolerance, IBS, chronic anemia, thrombocytopenia, COPD who also has a Pleurx catheter on the left side because of recurrent pleural effusion. Patient was sent to the ED on 5/17 for low sodium level of 119. Patient was admitted to hospitalist service.   Her hospital course was also complicated by hyperkalemia, AKI. She was seen by nephrology, CT surgery, oncology. See below for details.  Subjective: Patient was seen and examined this morning.  Sitting up in bed.  Trying her breakfast.  Had bowel movement twice after lactulose. Electrolyte levels improving. Husband at bedside.  Patient and family expecting her discharge tomorrow.  Principal Problem:   Acute on chronic respiratory failure with hypoxia (HCC) Active Problems:   Hyponatremia   SIRS (systemic inflammatory response syndrome) (HCC)   Hyperkalemia   Decompensated HCV cirrhosis (HCC)   Pleural effusion on left   Hypotension   Hepatocellular carcinoma (HCC)   Thrombocytopenia (HCC)   Anemia   Breast cancer (HCC)   Protein-calorie malnutrition, severe (HCC)    Assessment and Plan: Acute on chronic hyponatremia -Chronic hyponatremia due to hypovolemia related to liver cirrhosis and portal hypertension.  Patient was initially admitted with a very low sodium level 119.  Nephrology consult was obtained.  She was started on midodrine, IV albumin.  Sodium level eventually improved, currently roughly stable over 130. Recent Labs  Lab 04/26/22 1048 04/26/22 1201 04/27/22 0400 04/28/22 0630 04/29/22 0340 04/30/22 0330 05/01/22 0430 05/02/22 0353  NA 125* 126* 129*  126* 132* 132* 131* 135    AKI -Baseline creatinine normal.  Since 5/28, her creatinine started to worsen, peaked on 5/30 at 1.89.  Patient was suspected to be suffering from hepatorenal syndrome.  She was started on octreotide as well as albumin after which creatinine is fluctuating but improving.  It is at its best today.  Continue plan per nephrology. Recent Labs    04/24/22 0522 04/25/22 0430 04/26/22 1048 04/26/22 1201 04/27/22 0400 04/28/22 0425 04/28/22 0630 04/29/22 0340 04/30/22 0330 05/01/22 0430 05/02/22 0353  BUN 25* 30* 41* 43* 48*  --  48* 45* 36* 39* 33*  CREATININE 0.84 1.06* 1.55* 1.66* 1.89* 1.82* 1.63* 1.42* 1.19* 1.41* 1.14*    Hypotension -Blood pressure chronically low.  Currently on Midodrine '15mg'$  TID.  Status post albumin for 48 hours.  Also on octreotide 200 mcg 3 times daily today.  Recurrent pleural effusion -Patient states her pleural effusion started after microaspiration liver lesions in October 2022.  She required multiple thoracentesis and has left Pleurx catheter was placed in March 2023. -Last seen by CT surgery on 5/24.  Currently her Pleurx catheter is connected to Pleur-evac. -Per GI note from 5/19, "no plans for TIPS at this time, per IR Dr. Kathlene Cote, absolute last resort as there is no guarantee that TIPS will help."  Liver cirrhosis secondary to hepatitis C and alcohol use Portal hypertension, ascites History of esophageal varices and portal gastropathy History of upper GI bleeding -Follows up with [facility]. -ID UGI bleed in November 2022, varices were banded. -Patient was previously on transplant list but was diagnosed with breast cancer and had lumpectomy in November 2022 and hence no longer a transplant  candidate. -Continue Protonix.  Hemoglobin stable -Lactulose twice daily for 2-3 bowel movements a day Recent Labs    04/20/22 0347 04/21/22 0430 04/22/22 0430 04/26/22 1048 04/26/22 1200 04/27/22 0400 04/28/22 0630  04/29/22 0340 04/30/22 0330 05/01/22 0430  HGB 9.4* 9.3* 9.2* 10.6* 10.4* 10.0* 9.5* 9.7* 9.9* 10.2*      Hyperkalemia -K level went high to a peak of 6 on 6/3.  Improving on a.m. labs today.. Recent Labs  Lab 04/28/22 0630 04/29/22 0340 04/30/22 0330 05/01/22 0430 05/02/22 0353  K 5.3* 5.2* 5.4* 6.0* 5.1  PHOS 4.0  --   --   --   --     Anemia of chronic disease -Hemoglobin stable at more than 9.  Continue to monitor. Recent Labs    04/24/22 0522 04/26/22 1048 04/27/22 0400 04/28/22 0630 04/29/22 0340 04/30/22 0330 05/01/22 0430  HGB  --    < > 10.0* 9.5* 9.7* 9.9* 10.2*  MCV  --    < > 75.6* 77.3* 77.6* 77.9* 76.2*  FERRITIN 98  --   --   --   --   --   --   TIBC 181*  --   --   --   --   --   --   IRON 14*  --   --   --   --   --   --    < > = values in this interval not displayed.    Chronic thrombocytopenia  -Secondary to liver cirrhosis -Stable.  No bleeding Recent Labs  Lab 04/26/22 1048 04/26/22 1200 04/27/22 0400 04/28/22 0630 04/29/22 0340 04/30/22 0330 05/01/22 0430  PLT 143* PLATELET CLUMPS NOTED ON SMEAR, COUNT APPEARS ADEQUATE 118* 107* 118* 124* 134*     History of breast cancer History of HCC -Seen by [name] while in the hospital. -Noted a plan to repeat her abdominal MRI after discharge for [facility] follow up  Goals of care   Code Status: Full Code    Mobility: Encourage ambulation  Skin assessment:     Nutritional status:  Body mass index is 14.68 kg/m.  Nutrition Problem: Severe Malnutrition Etiology: chronic illness (cancer, cirrhosis) Signs/Symptoms: severe muscle depletion, severe fat depletion     Diet:  Diet Order             Diet regular Room service appropriate? Yes with Assist; Fluid consistency: Thin  Diet effective now                   DVT prophylaxis:  enoxaparin (LOVENOX) injection 30 mg Start: 04/14/22 2230   Antimicrobials: None Fluid: None Consultants: Nephrology Family Communication:  Husband at bedside  Status is: Inpatient  Continue in-hospital care because: Hypotension, AKI, abnormal electrolytes.  Unable to do octreotide as an outpatient.  AKI worsening Level of care: Progressive   Dispo: The patient is from: Home              Anticipated d/c is to: Home likely in 1 to 2 days              Patient currently is not medically stable to d/c.   Difficult to place patient No     Infusions:   albumin human 12.5 g (05/02/22 0835)    Scheduled Meds:  Chlorhexidine Gluconate Cloth  6 each Topical Daily   enoxaparin (LOVENOX) injection  30 mg Subcutaneous Q24H   feeding supplement (KATE FARMS STANDARD 1.4)  325 mL Oral Daily   lactulose  10 g Oral TID   lidocaine  1 patch Transdermal Q24H   magnesium oxide  400 mg Oral BID   midodrine  15 mg Oral TID WC   multivitamin with minerals  1 tablet Oral Daily   nicotine  21 mg Transdermal Daily   octreotide  200 mcg Subcutaneous TID   pantoprazole  40 mg Oral Daily   sodium chloride flush  10-40 mL Intracatheter Q12H   sodium chloride flush  3 mL Intravenous Q12H   tamoxifen  20 mg Oral Daily    PRN meds: acetaminophen **OR** acetaminophen, albuterol, baclofen, dicyclomine, guaiFENesin-dextromethorphan, hydrOXYzine, magic mouthwash w/lidocaine, metoCLOPramide (REGLAN) injection, nicotine polacrilex, ondansetron (ZOFRAN) IV, oxyCODONE, phenol, polyethylene glycol, prochlorperazine, senna-docusate, sodium chloride flush   Antimicrobials: Anti-infectives (From admission, onward)    Start     Dose/Rate Route Frequency Ordered Stop   04/20/22 1600  vancomycin (VANCOCIN) IVPB 1000 mg/200 mL premix  Status:  Discontinued        1,000 mg 200 mL/hr over 60 Minutes Intravenous Every 24 hours 04/19/22 1444 04/19/22 1544   04/20/22 1400  vancomycin (VANCOREADY) IVPB 500 mg/100 mL  Status:  Discontinued        500 mg 100 mL/hr over 60 Minutes Intravenous Every 24 hours 04/19/22 1604 04/20/22 1145   04/20/22 0845  fluconazole  (DIFLUCAN) tablet 150 mg        150 mg Oral  Once 04/20/22 0747 04/20/22 0941   04/19/22 1645  vancomycin (VANCOREADY) IVPB 750 mg/150 mL  Status:  Discontinued        750 mg 150 mL/hr over 60 Minutes Intravenous  Once 04/19/22 1552 04/19/22 1604   04/19/22 1645  vancomycin (VANCOREADY) IVPB 750 mg/150 mL        750 mg 150 mL/hr over 60 Minutes Intravenous  Once 04/19/22 1605 04/19/22 1753   04/19/22 1600  ceFEPIme (MAXIPIME) 2 g in sodium chloride 0.9 % 100 mL IVPB  Status:  Discontinued        2 g 200 mL/hr over 30 Minutes Intravenous Every 8 hours 04/19/22 1441 04/19/22 1545   04/19/22 1600  ceFEPIme (MAXIPIME) 2 g in sodium chloride 0.9 % 100 mL IVPB  Status:  Discontinued        2 g 200 mL/hr over 30 Minutes Intravenous Every 12 hours 04/19/22 1545 04/25/22 1343   04/19/22 1530  vancomycin (VANCOCIN) IVPB 1000 mg/200 mL premix  Status:  Discontinued        1,000 mg 200 mL/hr over 60 Minutes Intravenous  Once 04/19/22 1441 04/19/22 1544   04/16/22 1800  Ampicillin-Sulbactam (UNASYN) 3 g in sodium chloride 0.9 % 100 mL IVPB  Status:  Discontinued        3 g 200 mL/hr over 30 Minutes Intravenous Every 6 hours 04/16/22 1139 04/19/22 1421   04/16/22 1000  Ampicillin-Sulbactam (UNASYN) 3 g in sodium chloride 0.9 % 100 mL IVPB  Status:  Discontinued        3 g 200 mL/hr over 30 Minutes Intravenous Every 6 hours 04/16/22 0935 04/16/22 1139       Objective: Vitals:   05/02/22 0759 05/02/22 1250  BP: 100/69 104/67  Pulse: (!) 57 74  Resp: 15 19  Temp: (!) 97.5 F (36.4 C) 97.6 F (36.4 C)  SpO2: 100% 100%    Intake/Output Summary (Last 24 hours) at 05/02/2022 1256 Last data filed at 05/02/2022 1252 Gross per 24 hour  Intake 751.36 ml  Output 585 ml  Net 166.36 ml  Filed Weights   04/30/22 0407 05/01/22 0425 05/02/22 0322  Weight: 36.3 kg 35.5 kg 37.6 kg   Weight change: 2.1 kg Body mass index is 14.68 kg/m.   Physical Exam: General exam: Thin built middle-aged  Caucasian female.  Not in physical distress Skin: No rashes, lesions or ulcers. HEENT: Atraumatic, normocephalic, no obvious bleeding Lungs: Clear to auscultation bilaterally.  Left chest wall with PleurX catheter draining to Pleur-evac CVS: Regular rate and rhythm, no murmur GI/Abd soft, nontender, nondistended, bowel sound present CNS: Alert, awake, oriented x3. Psychiatry: Frustrated because of long stay Extremities: No pedal edema, no calf tenderness  Data Review: I have personally reviewed the laboratory data and studies available.  F/u labs ordered Unresulted Labs (From admission, onward)     Start     Ordered   04/21/22 0500  Creatinine, serum  (enoxaparin (LOVENOX)    CrCl >/= 30 ml/min)  Weekly,   R     Comments: while on enoxaparin therapy    04/14/22 2226            Signed, Terrilee Croak, MD [facility] 05/02/2022

## 2022-05-02 NOTE — Plan of Care (Signed)
  Problem: Education: Goal: Knowledge of General Education information will improve Description Including pain rating scale, medication(s)/side effects and non-pharmacologic comfort measures Outcome: Progressing   

## 2022-05-02 NOTE — Progress Notes (Signed)
[facility] Progress Note  Background: Reconsulted for AKI. Originally consulted for hyponatremia. 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  Had multiple thoracentesis and had a Pleurx catheter placed.  We were originally consulted for hyponatremia which is likely related to patient's cirrhosis and portal hypertension.  She was started on midodrine and will was started on IV albumin as well initially.  We had signed off on 5/21 as her sodium did improve to 133. We are being reconsulted for AKI.  Her creatinine has been around 0.5-0.6.  Started to progressively rise on 5/27, now up to 1.89 on 5/30.  Her last dose of Lasix was on 5/29.  Has been maintained on midodrine.  She did receive albumin on 5/29 x 2 doses then 1 more dose on 5/30.  Blood pressures have been low since 5/29.  She did also have an issue of hyperkalemia which improved with Lokelma.  Subjective: Patient seen and examined bedside.  Husband at bedside. Patient reports that she had a bowel movement finally. Feels a little weak today. No other complaints.   Vitals:   05/01/22 2005 05/01/22 2347 05/02/22 0322 05/02/22 0759  BP: 136/88 107/75 94/60 100/69  Pulse: 78 88 (!) 115 (!) 57  Resp: '16 14 16 15  '$ Temp: 97.8 F (36.6 C) (!) 97.4 F (36.3 C) 99.3 F (37.4 C) (!) 97.5 F (36.4 C)  TempSrc: Oral Axillary Oral Oral  SpO2: 100% 100% 100% 100%  Weight:   37.6 kg   Height:        Exam: Gen: alert, NAD, thin and frail, sitting up in bed Cardiac: RRR, s1s2 Resp: diminished air entry left > right base, normal WOB, left pleurex in place, unlabored Abd: soft, nt/nd Ext: no edema Neuro: awake, alert, speech clear and coherent, moves all ext spontaneously     Recent Labs  Lab 04/28/22 0630 04/29/22 0340 04/30/22 0330  05/01/22 0430 05/02/22 0353  HGB 9.5*   < > 9.9* 10.2*  --   ALBUMIN 3.4*  --   --   --   --   CALCIUM 8.4*   < > 8.1* 8.4* 8.7*  PHOS 4.0  --   --   --   --   CREATININE 1.63*   < > 1.19* 1.41* 1.14*  K 5.3*   < > 5.4* 6.0* 5.1   < > = values in this interval not displayed.   Inpatient medications:  Chlorhexidine Gluconate Cloth  6 each Topical Daily   enoxaparin (LOVENOX) injection  30 mg Subcutaneous Q24H   feeding supplement (KATE FARMS STANDARD 1.4)  325 mL Oral Daily   lactulose  10 g Oral TID   lidocaine  1 patch Transdermal Q24H   magnesium oxide  400 mg Oral BID   midodrine  15 mg Oral TID WC   multivitamin with minerals  1 tablet Oral Daily   nicotine  21 mg Transdermal Daily   octreotide  200 mcg Subcutaneous TID   pantoprazole  40 mg Oral Daily   sodium chloride flush  10-40 mL Intracatheter Q12H   sodium chloride flush  3 mL Intravenous Q12H   tamoxifen  20 mg Oral Daily    albumin human 12.5 g (05/02/22 0835)    acetaminophen **OR** acetaminophen, albuterol, baclofen, dicyclomine, guaiFENesin-dextromethorphan, hydrOXYzine, magic mouthwash w/lidocaine, metoCLOPramide (REGLAN) injection, nicotine polacrilex, ondansetron (ZOFRAN)  IV, oxyCODONE, phenol, polyethylene glycol, prochlorperazine, senna-docusate, sodium chloride flush   Assessment/ Plan: AKI : secondary to HRS--UA bland, renal u/s unremarkable, UNa <10. Baseline Cr ~0.5 Cr better, down to 1.1 Continue to hold lasix, on midodrine '15mg'$  TID.  Increased octreotide subq to 219mg TID on 6/2. No options to do octreotide as an outpatient. Given acute issues on Friday with vomiting/low PO intake (which is why her Cr likely worsened on Sat), restarted albumin on 6/3. Would complete 48 hr course. Had a lengthy/separate conversation with her husband regards to long-term plan if things do not turn around (discussion on 6/3).  Discussed that if labs start worsening again despite max medical management, would recommend  getting palliative care involved to address further goals of care especially since she is not a liver transplant candidate and with underlying malignancy. Avoid nephrotoxic medications including NSAIDs and iodinated intravenous contrast exposure unless the latter is absolutely indicated.  Preferred narcotic agents for pain control are hydromorphone, fentanyl, and methadone. Morphine should not be used. Avoid Baclofen and avoid oral sodium phosphate and magnesium citrate based laxatives / bowel preps. Continue strict Input and Output monitoring. Will monitor the patient closely with you and intervene or adjust therapy as indicated by changes in clinical status/labs  Hyponatremia - in a pt w/ cirrhosis and portal HTN. Treatment of hyponatremia in cirrhotics is unique in that the main goal is to improve blood pressures, which should then improve renal perfusion and therefore water clearance. HypoNa in itself carries a poor prognosis in the context of cirrhosis. Encouraged solute intake (her PO intake is poor to begin with, hence her diet has been liberalized), HRS plan as above. Na up to 135 Acute on chronic HRF, recurrent left pleural effusion - transudate effusion s/p pleurex catheter. Incentive spirometer ordered Decompensated Cirrhosis - Per review of her chart in care everywhere, she does follow with transplant hepatology and is not a candidate for liver transplant given her underlying HOdeboltand invasive breast Ca Hypotension - midodrine now '15mg'$  tid and octreotide 2055m tid Hyperkalemia-had an issue with lokelma and vomiting but tolerated lokelma 6/3 w/ antiemetics. K improved today HCC - sp radiation implant H/o breast cancer - rx'd by lumpectomy in 2020 Constipation: Bowel regimen per primary service  ViGean QuintMD [facility]

## 2022-05-03 ENCOUNTER — Other Ambulatory Visit: Payer: Self-pay

## 2022-05-03 ENCOUNTER — Telehealth: Payer: Self-pay

## 2022-05-03 LAB — BASIC METABOLIC PANEL
Anion gap: 5 (ref 5–15)
BUN: 22 mg/dL — ABNORMAL HIGH (ref 6–20)
CO2: 24 mmol/L (ref 22–32)
Calcium: 8.8 mg/dL — ABNORMAL LOW (ref 8.9–10.3)
Chloride: 109 mmol/L (ref 98–111)
Creatinine, Ser: 0.91 mg/dL (ref 0.44–1.00)
GFR, Estimated: >60 mL/min (ref >60)
Glucose, Bld: 103 mg/dL — ABNORMAL HIGH (ref 70–99)
Potassium: 4.8 mmol/L (ref 3.5–5.1)
Sodium: 138 mmol/L (ref 135–145)

## 2022-05-03 LAB — CBC WITH DIFFERENTIAL/PLATELET
Abs Immature Granulocytes: 0.02 10*3/uL (ref 0.00–0.07)
Basophils Absolute: 0.1 10*3/uL (ref 0.0–0.1)
Basophils Relative: 1 %
Eosinophils Absolute: 0.2 10*3/uL (ref 0.0–0.5)
Eosinophils Relative: 4 %
HCT: 28.1 % — ABNORMAL LOW (ref 36.0–46.0)
Hemoglobin: 8.9 g/dL — ABNORMAL LOW (ref 12.0–15.0)
Immature Granulocytes: 1 %
Lymphocytes Relative: 17 %
Lymphs Abs: 0.8 10*3/uL (ref 0.7–4.0)
MCH: 25 pg — ABNORMAL LOW (ref 26.0–34.0)
MCHC: 31.7 g/dL (ref 30.0–36.0)
MCV: 78.9 fL — ABNORMAL LOW (ref 80.0–100.0)
Monocytes Absolute: 0.8 10*3/uL (ref 0.1–1.0)
Monocytes Relative: 18 %
Neutro Abs: 2.6 10*3/uL (ref 1.7–7.7)
Neutrophils Relative %: 59 %
Platelets: 108 10*3/uL — ABNORMAL LOW (ref 150–400)
RBC: 3.56 MIL/uL — ABNORMAL LOW (ref 3.87–5.11)
RDW: 24 % — ABNORMAL HIGH (ref 11.5–15.5)
WBC: 4.3 10*3/uL (ref 4.0–10.5)
nRBC: 0 % (ref 0.0–0.2)

## 2022-05-03 MED ORDER — LACTULOSE 10 GM/15ML PO SOLN: 10.0000 g | Freq: Two times a day (BID) | ORAL | 2 refills | Status: AC | PRN

## 2022-05-03 MED ORDER — MIDODRINE HCL 5 MG PO TABS: 15.0000 mg | ORAL_TABLET | Freq: Three times a day (TID) | ORAL | 2 refills | Status: AC

## 2022-05-03 NOTE — Discharge Summary (Signed)
Physician Discharge Summary  Theresa Rocha HYQ:[id number] DOB: [birth year] DOA: 04/14/2022  PCP: Seward Carol, MD  Admit date: 04/14/2022 Discharge date: 05/03/2022  Admitted From: Home Discharge disposition: Home with home health PT  Recommendations at discharge:  Follow-up with PCP as an outpatient for blood work in next 2 days. Follow-up with GI as an outpatient.  Brief narrative: Theresa Rocha is a 56 y.o. female with PMH significant for right sided breast cancer, hepatocellular cancer, chronic hepatitis C, nonalcoholic liver cirrhosis, portal hypertension, esophageal varices, lactose intolerance, IBS, chronic anemia, thrombocytopenia, COPD who also has a Pleurx catheter on the left side because of recurrent pleural effusion. Patient was sent to the ED on 5/17 for low sodium level of 119. Patient was admitted to hospitalist service.   Her hospital course was also complicated by hyperkalemia, AKI. She was seen by nephrology, CT surgery, oncology. See below for details.  Subjective: Patient was seen and examined this morning.  Sitting up in bed.  Improving oral appetite.  Had a bowel movement yesterday and today regularly.  Physically weak.  Labs improving.  Husband at bedside. I checked with nephrology Dr. Marval Regal this morning.  Agreeable for discharge today.  Principal Problem:   Acute on chronic respiratory failure with hypoxia (HCC) Active Problems:   Hyponatremia   SIRS (systemic inflammatory response syndrome) (HCC)   Hyperkalemia   Decompensated HCV cirrhosis (HCC)   Pleural effusion on left   Hypotension   Hepatocellular carcinoma (HCC)   Thrombocytopenia (HCC)   Anemia   Breast cancer (HCC)   Protein-calorie malnutrition, severe [facility])    Hospital course: AKI -Baseline creatinine normal.  Since 5/28, her creatinine started to worsen, peaked on 5/30 at 1.89.  Patient was suspected to be suffering from hepatorenal syndrome.  She was started on octreotide as well  as albumin after which creatinine is fluctuating but improving.  Creatinine back down to normal at 0.91.   Recent Labs    04/25/22 0430 04/26/22 1048 04/26/22 1201 04/27/22 0400 04/28/22 0425 04/28/22 0630 04/29/22 0340 04/30/22 0330 05/01/22 0430 05/02/22 0353 05/03/22 0304  BUN 30* 41* 43* 48*  --  48* 45* 36* 39* 33* 22*  CREATININE 1.06* 1.55* 1.66* 1.89* 1.82* 1.63* 1.42* 1.19* 1.41* 1.14* 0.91   Acute on chronic hyponatremia -Chronic hyponatremia due to hypovolemia related to liver cirrhosis and portal hypertension.  Patient was initially admitted with a very low sodium level 119.  Nephrology consult was obtained.   -Her sodium level gradually improved with midodrine, IV albumin, subcu octreotide.  Sodium level now in normal range.  Hyperkalemia -K level went high to a peak of 6 on 6/3.  Gradually improved down to normal.  Hypotension -Blood pressure chronically low.  Currently on Midodrine '15mg'$  TID.  Status post octreotide and albumin.  Recurrent pleural effusion -Patient states her pleural effusion started after microaspiration liver lesions in October 2022.  She required multiple thoracentesis and has left Pleurx catheter was placed in March 2023. -Last seen by CT surgery on 5/24.  Currently her Pleurx catheter is connected to Pleur-evac.  Continue twice daily draining at home as before. -Per GI note from 5/19, "no plans for TIPS at this time, per IR Dr. Kathlene Cote, absolute last resort as there is no guarantee that TIPS will help."  Liver cirrhosis secondary to hepatitis C and alcohol use Portal hypertension, ascites History of esophageal varices and portal gastropathy History of upper GI bleeding -Follows up with [facility]. -ID UGI bleed in November  2022, varices were banded. -Patient was previously on transplant list but was diagnosed with breast cancer and had lumpectomy in November 2022 and hence no longer a transplant candidate. -Continue Protonix.  Hemoglobin  stable -Lactulose twice daily for 2-3 bowel movements a day Recent Labs    04/21/22 0430 04/22/22 0430 04/26/22 1048 04/26/22 1200 04/27/22 0400 04/28/22 0630 04/29/22 0340 04/30/22 0330 05/01/22 0430 05/03/22 0304  HGB 9.3* 9.2* 10.6* 10.4* 10.0* 9.5* 9.7* 9.9* 10.2* 8.9*     Anemia of chronic disease -Hemoglobin stable at more than 9.  Continue to monitor. Recent Labs    04/24/22 0522 04/26/22 1048 04/28/22 0630 04/29/22 0340 04/30/22 0330 05/01/22 0430 05/03/22 0304  HGB  --    < > 9.5* 9.7* 9.9* 10.2* 8.9*  MCV  --    < > 77.3* 77.6* 77.9* 76.2* 78.9*  FERRITIN 98  --   --   --   --   --   --   TIBC 181*  --   --   --   --   --   --   IRON 14*  --   --   --   --   --   --    < > = values in this interval not displayed.   Chronic thrombocytopenia  -Secondary to liver cirrhosis -Stable.  No bleeding Recent Labs  Lab 04/26/22 1200 04/27/22 0400 04/28/22 0630 04/29/22 0340 04/30/22 0330 05/01/22 0430 05/03/22 0304  PLT PLATELET CLUMPS NOTED ON SMEAR, COUNT APPEARS ADEQUATE 118* 107* 118* 124* 134* 108*    History of breast cancer History of HCC -Seen by [name] while in the hospital. -Noted a plan to repeat her abdominal MRI after discharge for [facility] follow up  Goals of care   Code Status: Full Code    Mobility: Encourage ambulation  Skin assessment:     Nutritional status:  Body mass index is 14.64 kg/m.  Nutrition Problem: Severe Malnutrition Etiology: chronic illness (cancer, cirrhosis) Signs/Symptoms: severe muscle depletion, severe fat depletion     Diet:  Diet Order             Diet - low sodium heart healthy           Diet regular Room service appropriate? Yes with Assist; Fluid consistency: Thin  Diet effective now                    Wounds:  -    Discharge Exam:   Vitals:   05/02/22 2344 05/03/22 0347 05/03/22 0500 05/03/22 0759  BP: 104/69 106/76  96/62  Pulse: 70 73 73 86  Resp: '14 15 13 16  '$ Temp: 97.9 F  (36.6 C) 98.4 F (36.9 C)  97.8 F (36.6 C)  TempSrc: Oral Oral  Oral  SpO2: 99% 100% 97% 100%  Weight:   37.5 kg   Height:        Body mass index is 14.64 kg/m.  General exam: Thin built middle-aged Caucasian female.  Not in physical distress Skin: No rashes, lesions or ulcers. HEENT: Atraumatic, normocephalic, no obvious bleeding Lungs: Clear to auscultation bilaterally.  Left chest wall with PleurX catheter draining to Pleur-evac CVS: Regular rate and rhythm, no murmur GI/Abd soft, nontender, nondistended, bowel sound present CNS: Alert, awake, oriented x3. Psychiatry: Frustrated because of long stay Extremities: No pedal edema, no calf tenderness  Follow ups:    Follow-up Information     Seward Carol, MD Follow up in 1 week(s).  Specialty: Internal Medicine Contact information: [address] [place] [address] [phone]         [facility] Follow up.   Why: (Adapt)- rollator arraned- and has been delivered to the room Contact information: [place] [address] [phone]         [facility] Follow up.   Specialty: Home Health Services Why: HHPT arranged- they will contact you to schedule therapy visits within 48 hr post discharge. Contact information: [place] [address] [phone]                 Discharge Instructions:   Discharge Instructions     Call MD for:  difficulty breathing, headache or visual disturbances   Complete by: As directed    Call MD for:  extreme fatigue   Complete by: As directed    Call MD for:  hives   Complete by: As directed    Call MD for:  persistant dizziness or light-headedness   Complete by: As directed    Call MD for:  persistant nausea and vomiting   Complete by: As directed    Call MD for:  severe uncontrolled pain   Complete by: As directed    Call MD for:  temperature >100.4   Complete by: As directed     Diet - low sodium heart healthy   Complete by: As directed    Discharge instructions   Complete by: As directed    Recommendations at discharge:   Follow-up with PCP as an outpatient for blood work in next 2 days.  Follow-up with GI as an outpatient.  General discharge instructions: Follow with Primary MD Seward Carol, MD in 7 days  Please request your PCP  to go over your hospital tests, procedures, radiology results at the follow up. Please get your medicines reviewed and adjusted.  Your PCP may decide to repeat certain labs or tests as needed. Do not drive, operate heavy machinery, perform activities at heights, swimming or participation in water activities or provide baby sitting services if your were admitted for syncope or siezures until you have seen by Primary MD or a Neurologist and advised to do so again. [place] Controlled Substance Reporting System database was reviewed. Do not drive, operate heavy machinery, perform activities at heights, swim, participate in water activities or provide baby-sitting services while on medications for pain, sleep and mood until your outpatient physician has reevaluated you and advised to do so again.  You are strongly recommended to comply with the dose, frequency and duration of prescribed medications. Activity: As tolerated with Full fall precautions use walker/cane & assistance as needed Avoid using any recreational substances like cigarette, tobacco, alcohol, or non-prescribed drug. If you experience worsening of your admission symptoms, develop shortness of breath, life threatening emergency, suicidal or homicidal thoughts you must seek medical attention immediately by calling 911 or calling your MD immediately  if symptoms less severe. You must read complete instructions/literature along with all the possible adverse reactions/side effects for all the medicines you take and that have been prescribed to you. Take any new medicine only  after you have completely understood and accepted all the possible adverse reactions/side effects.  Wear Seat belts while driving. You were cared for by a hospitalist during your hospital stay. If you have any questions about your discharge medications or the care you received while you were in the hospital after you are  discharged, you can call the unit and ask to speak with the hospitalist or the covering physician. Once you are discharged, your primary care physician will handle any further medical issues. Please note that NO REFILLS for any discharge medications will be authorized once you are discharged, as it is imperative that you return to your primary care physician (or establish a relationship with a primary care physician if you do not have one).   Increase activity slowly   Complete by: As directed        Discharge Medications:   Allergies as of 05/03/2022       Reactions   Hydromorphone Hcl Nausea And Vomiting   Severe vomiting   Aspirin Nausea Only        Medication List     STOP taking these medications    loperamide 2 MG capsule Commonly known as: IMODIUM       TAKE these medications    baclofen 10 MG tablet Commonly known as: LIORESAL Take 5 mg by mouth daily as needed for muscle spasms.   dicyclomine 20 MG tablet Commonly known as: BENTYL Take 0.5 tablets (10 mg total) by mouth every 6 (six) hours as needed for spasms. Take one tablet every 6-8 hours as needed for abdominal cramping What changed:  how much to take when to take this reasons to take this additional instructions   lactulose 10 GM/15ML solution Commonly known as: CHRONULAC Take 15 mLs (10 g total) by mouth 2 (two) times daily as needed for mild constipation.   midodrine 5 MG tablet Commonly known as: PROAMATINE Take 3 tablets (15 mg total) by mouth 3 (three) times daily with meals.   nicotine 21 mg/24hr patch Commonly known as: NICODERM CQ - dosed in mg/24 hours Place 21 mg onto  the skin daily as needed (smoking cessation on work days).   oxyCODONE 5 MG immediate release tablet Commonly known as: Oxy IR/ROXICODONE Take 1-2 tablets (5-10 mg total) by mouth every 4 (four) hours as needed for severe pain.   pantoprazole 40 MG tablet Commonly known as: PROTONIX Take 1 tablet (40 mg total) by mouth daily.   promethazine 12.5 MG tablet Commonly known as: PHENERGAN Take 1 tablet (12.5 mg total) by mouth every 6 (six) hours as needed for nausea or vomiting.   tamoxifen 20 MG tablet Commonly known as: NOLVADEX TAKE 1 TABLET BY MOUTH EVERY DAY What changed: when to take this               Durable Medical Equipment  (From admission, onward)           Start     Ordered   04/30/22 1659  For home use only DME 4 wheeled rolling walker with seat  Once       Question:  Patient needs a walker to treat with the following condition  Answer:  Weakness   04/30/22 1658             The results of significant diagnostics from this hospitalization (including imaging, microbiology, ancillary and laboratory) are listed below for reference.    Procedures and Diagnostic Studies:   DG Chest Portable 1 View  Result Date: 04/14/2022 CLINICAL DATA:  Pleural effusion, pleural drain with large amounts of drainage history of liver tumor. EXAM: PORTABLE CHEST 1 VIEW COMPARISON:  Apr 08, 2022. FINDINGS: Trachea midline. Cardiomediastinal contours and hilar structures are stable. No visible pneumothorax with pleural drain in stable position, tip at the LEFT lung apex. Similar  volume of pleural fluid when compared to the study of Apr 08, 2022. Surgical clips project over the RIGHT chest and axilla. On limited assessment there is no acute skeletal process. IMPRESSION: Stable chest x-ray. Pleural drain remains in place with moderate LEFT-sided pleural effusion. Electronically Signed   By: Zetta Bills M.D.   On: 04/14/2022 17:17     Labs:   Basic Metabolic Panel: Recent  Labs  Lab 04/28/22 0630 04/29/22 0340 04/30/22 0330 05/01/22 0430 05/02/22 0353 05/03/22 0304  NA 126* 132* 132* 131* 135 138  K 5.3* 5.2* 5.4* 6.0* 5.1 4.8  CL 97* 102 102 103 104 109  CO2 '22 25 24 23 24 24  '$ GLUCOSE 100* 123* 112* 92 103* 103*  BUN 48* 45* 36* 39* 33* 22*  CREATININE 1.63* 1.42* 1.19* 1.41* 1.14* 0.91  CALCIUM 8.4* 8.2* 8.1* 8.4* 8.7* 8.8*  PHOS 4.0  --   --   --   --   --    GFR Estimated Creatinine Clearance: 41.4 mL/min (by C-G formula based on SCr of 0.91 mg/dL). Liver Function Tests: Recent Labs  Lab 04/28/22 0630  ALBUMIN 3.4*   No results for input(s): LIPASE, AMYLASE in the last 168 hours. Recent Labs  Lab 04/27/22 0718 04/30/22 0330  AMMONIA 60* 74*   Coagulation profile No results for input(s): INR, PROTIME in the last 168 hours.  CBC: Recent Labs  Lab 04/27/22 0400 04/28/22 0630 04/29/22 0340 04/30/22 0330 05/01/22 0430 05/03/22 0304  WBC 10.4 6.5 6.7 6.9 8.0 4.3  NEUTROABS 7.5  --  4.1 4.5 5.5 2.6  HGB 10.0* 9.5* 9.7* 9.9* 10.2* 8.9*  HCT 30.1* 29.3* 29.5* 31.3* 31.0* 28.1*  MCV 75.6* 77.3* 77.6* 77.9* 76.2* 78.9*  PLT 118* 107* 118* 124* 134* 108*   Cardiac Enzymes: No results for input(s): CKTOTAL, CKMB, CKMBINDEX, TROPONINI in the last 168 hours. BNP: Invalid input(s): POCBNP CBG: No results for input(s): GLUCAP in the last 168 hours. D-Dimer No results for input(s): DDIMER in the last 72 hours. Hgb A1c No results for input(s): HGBA1C in the last 72 hours. Lipid Profile No results for input(s): CHOL, HDL, LDLCALC, TRIG, CHOLHDL, LDLDIRECT in the last 72 hours. Thyroid function studies No results for input(s): TSH, T4TOTAL, T3FREE, THYROIDAB in the last 72 hours.  Invalid input(s): FREET3 Anemia work up No results for input(s): VITAMINB12, FOLATE, FERRITIN, TIBC, IRON, RETICCTPCT in the last 72 hours. Microbiology No results found for this or any previous visit (from the past 240 hour(s)).  Time coordinating  discharge: 35 minutes  Signed: Mayuri Staples  [facility] 05/03/2022, 11:30 AM

## 2022-05-03 NOTE — Telephone Encounter (Signed)
Pt called while inpatient.  Pt stated she will be d/c home today only if she has a f/u lab appt scheduled with Dr. Ernestina Penna office.  Pt stated the inpatient provider will not d/c pt unless she has a f/u lab appt to monitor her Scr+.  Scheduled pt with a f/u lab appt and notified Cira Rue, NP and [name] of the appt.

## 2022-05-03 NOTE — TOC Transition Note (Signed)
Transition of Care (TOC) - CM/SW Discharge Note Marvetta Gibbons RN, BSN Transitions of Care Unit 4E- RN Case Manager See Treatment Team for direct phone #    Patient Details  Name: Theresa Rocha MRN: [id number] Date of Birth: [birth year]  Transition of Care [facility]) CM/SW Contact:  Dawayne Patricia, RN Phone Number: 05/03/2022, 12:22 PM   Clinical Narrative:    Pt stable for transition home today, noted previous TOC note regarding barriers to finding [facility] agency to accept for [facility].  CM spoke with pt and spouse at bedside. DME-rollator has been delivered to room for home.  Per conversation with pt and spouse they do not feel the will need [facility] for PleurX cath needs. State that they were doing the drainage at home prior to admission on their own, and have supplies at home from [place]. Pt also reports she has already placed another order for supplies as well. They also share that their daughter will be assisting if needed.  Pt does  however state she feels she would benefit from HHPT as she feels she has reduced strength and endurance. Msg sent to MD for HHPT order. Explained to pt that staffing may still be a barrier but will reach back out to [facility] to see if they can assist and if unable will try some of the other [facility] agencies as well. Pt voiced understanding and agreeable.   Address, phone # confirmed.   Call made to [facility] with [facility]- referral accepted for HHPT only, no HHRN at this time. Pt/Spouse able to take care of PleurXcath needs.      Final next level of care: [place] Barriers to Discharge: Barriers Resolved   Patient Goals and CMS Choice Patient states their goals for this hospitalization and ongoing recovery are:: to go home CMS Medicare.gov Compare Post Acute Care list provided to:: Patient Choice offered to / list presented to : Patient  Discharge Placement                 Home w/ [facility]      Discharge Plan and Services   Discharge Planning  Services: CM Consult Post Acute Care Choice: Home Health          DME Arranged: Walker rolling with seat DME Agency: [employer] Date DME Agency Contacted: 05/02/22     HH Arranged: PT [place] Agency: [place] Date [facility] Agency Contacted: 05/03/22 Time HH Agency Contacted: 1100 Representative spoke with at [place]: [place] Interventions     Readmission Risk Interventions    05/03/2022   12:21 PM  Readmission Risk Prevention Plan  Transportation Screening Complete  Medication Review Press photographer) Complete  PCP or Specialist appointment within 3-5 days of discharge Complete  HRI or [place] Complete  SW Recovery Care/Counseling Consult Complete  [place] Not Applicable

## 2022-05-03 NOTE — Progress Notes (Signed)
Mobility Specialist: Progress Note   05/03/22 1224  Mobility  Activity Ambulated with assistance in hallway  Level of Assistance Standby assist, set-up cues, supervision of patient - no hands on  Assistive Device Four wheel walker  Distance Ambulated (ft) 370 ft  Activity Response Tolerated well  $Mobility charge 1 Mobility   Pre-Mobility: 83 HR Post-Mobility: 77 HR, 104/76 (78) BP  Received pt in bed having no complaints and agreeable to mobility. Asymptomatic throughout ambulation, returned back to bed w/ call bell in reach and all needs met.  [facility] Hagan Maltz Mobility Specialist Mobility Specialist 5 North: [phone] Mobility Specialist 6 North: [phone]

## 2022-05-03 NOTE — Progress Notes (Signed)
Patient ID: Theresa Rocha, female   DOB: 10-01-1966, 56 y.o.   MRN: [id number] S: Feeling better today but couldn't sleep from 2am to 4 am. O:BP 96/62 (BP Location: Left Arm)   Pulse 86   Temp 97.8 F (36.6 C) (Oral)   Resp 16   Ht '5\' 3"'$  (1.6 m)   Wt 37.5 kg   LMP 04/20/2013   SpO2 100%   BMI 14.64 kg/m   Intake/Output Summary (Last 24 hours) at 05/03/2022 1118 Last data filed at 05/03/2022 0806 Gross per 24 hour  Intake 884.27 ml  Output 0 ml  Net 884.27 ml   Intake/Output: I/O last 3 completed shifts: In: 1265.6 [P.O.:20; IV Piggyback:1245.6] Out: 525 [Urine:375; Chest Tube:150]  Intake/Output this shift:  Total I/O In: 120 [P.O.:120] Out: -  Weight change: -0.1 kg Gen:NAD CVS: RRR Resp:CTA Abd: +BS, soft, NT/ND Ext: no edema  Recent Labs  Lab 04/27/22 0400 04/28/22 0425 04/28/22 0630 04/29/22 0340 04/30/22 0330 05/01/22 0430 05/02/22 0353 05/03/22 0304  NA 129*  --  126* 132* 132* 131* 135 138  K 5.0  --  5.3* 5.2* 5.4* 6.0* 5.1 4.8  CL 98  --  97* 102 102 103 104 109  CO2 24  --  '22 25 24 23 24 24  '$ GLUCOSE 97  --  100* 123* 112* 92 103* 103*  BUN 48*  --  48* 45* 36* 39* 33* 22*  CREATININE 1.89* 1.82* 1.63* 1.42* 1.19* 1.41* 1.14* 0.91  ALBUMIN  --   --  3.4*  --   --   --   --   --   CALCIUM 8.3*  --  8.4* 8.2* 8.1* 8.4* 8.7* 8.8*  PHOS  --   --  4.0  --   --   --   --   --    Liver Function Tests: Recent Labs  Lab 04/28/22 0630  ALBUMIN 3.4*   No results for input(s): LIPASE, AMYLASE in the last 168 hours. Recent Labs  Lab 04/27/22 0718 04/30/22 0330  AMMONIA 60* 74*   CBC: Recent Labs  Lab 04/28/22 0630 04/29/22 0340 04/30/22 0330 05/01/22 0430 05/03/22 0304  WBC 6.5 6.7 6.9 8.0 4.3  NEUTROABS  --  4.1 4.5 5.5 2.6  HGB 9.5* 9.7* 9.9* 10.2* 8.9*  HCT 29.3* 29.5* 31.3* 31.0* 28.1*  MCV 77.3* 77.6* 77.9* 76.2* 78.9*  PLT 107* 118* 124* 134* 108*   Cardiac Enzymes: No results for input(s): CKTOTAL, CKMB, CKMBINDEX, TROPONINI in the  last 168 hours. CBG: No results for input(s): GLUCAP in the last 168 hours.  Iron Studies: No results for input(s): IRON, TIBC, TRANSFERRIN, FERRITIN in the last 72 hours. Studies/Results: No results found.  Chlorhexidine Gluconate Cloth  6 each Topical Daily   enoxaparin (LOVENOX) injection  30 mg Subcutaneous Q24H   feeding supplement (KATE FARMS STANDARD 1.4)  325 mL Oral Daily   lactulose  10 g Oral TID   lidocaine  1 patch Transdermal Q24H   magnesium oxide  400 mg Oral BID   midodrine  15 mg Oral TID WC   multivitamin with minerals  1 tablet Oral Daily   nicotine  21 mg Transdermal Daily   octreotide  200 mcg Subcutaneous TID   pantoprazole  40 mg Oral Daily   sodium chloride flush  10-40 mL Intracatheter Q12H   sodium chloride flush  3 mL Intravenous Q12H   tamoxifen  20 mg Oral Daily    BMET  Component Value Date/Time   NA 138 05/03/2022 0304   K 4.8 05/03/2022 0304   CL 109 05/03/2022 0304   CO2 24 05/03/2022 0304   GLUCOSE 103 (H) 05/03/2022 0304   BUN 22 (H) 05/03/2022 0304   CREATININE 0.91 05/03/2022 0304   CREATININE 0.55 04/14/2022 1256   CREATININE 0.65 08/11/2017 1159   CALCIUM 8.8 (L) 05/03/2022 0304   GFRNONAA >60 05/03/2022 0304   GFRNONAA >60 04/14/2022 1256   GFRNONAA 104 08/11/2017 1159   GFRAA >60 07/02/2020 0834   GFRAA 120 08/11/2017 1159   CBC    Component Value Date/Time   WBC 4.3 05/03/2022 0304   RBC 3.56 (L) 05/03/2022 0304   HGB 8.9 (L) 05/03/2022 0304   HGB 12.3 04/14/2022 1256   HCT 28.1 (L) 05/03/2022 0304   HCT 27.6 (L) 01/02/2021 1047   PLT 108 (L) 05/03/2022 0304   PLT 125 (L) 04/14/2022 1256   MCV 78.9 (L) 05/03/2022 0304   MCH 25.0 (L) 05/03/2022 0304   MCHC 31.7 05/03/2022 0304   RDW 24.0 (H) 05/03/2022 0304   LYMPHSABS 0.8 05/03/2022 0304   MONOABS 0.8 05/03/2022 0304   EOSABS 0.2 05/03/2022 0304   BASOSABS 0.1 05/03/2022 0304    Assessment/ Plan: 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 Baseline Cr ~0.5 Cr better, down to 0.9 Continue to hold lasix and continue midodrine '15mg'$  TID.  Increased octreotide subq to 286mg TID on 6/2. No options to do octreotide as an outpatient. Ok to stop octreotide and discharge to home with close follow up with her hepatologist and oncologist. Hyponatremia - in a pt w/ cirrhosis and portal HTN. Treatment of hyponatremia in cirrhotics is unique in that the main goal is to improve blood pressures, which should then improve renal perfusion and therefore water clearance. HypoNa in itself carries a poor prognosis in the context of cirrhosis. Encouraged solute intake (her PO intake is poor to begin with, hence her diet has been liberalized), HRS plan as above. Na up to 138 Acute on chronic HRF, recurrent left pleural effusion - transudate effusion s/p pleurex catheter. Incentive spirometer ordered Decompensated Cirrhosis - Per review of her chart in care everywhere, she does follow with transplant hepatology and is not a candidate for liver transplant given her underlying [place] invasive breast Ca Hypotension - midodrine now '15mg'$  tid and octreotide 2020m tid Hyperkalemia-had an issue with lokelma and vomiting but tolerated lokelma 6/3 w/ antiemetics. K improved today HCC - sp radiation implant H/o breast cancer - rx'd by lumpectomy in 2020 Constipation: Bowel regimen per primary service  JoDonetta PottsMD [facility]

## 2022-05-05 ENCOUNTER — Inpatient Hospital Stay: Payer: BC Managed Care – PPO | Attending: Hematology

## 2022-05-05 ENCOUNTER — Telehealth: Payer: Self-pay | Admitting: Nurse Practitioner

## 2022-05-05 ENCOUNTER — Other Ambulatory Visit: Payer: Self-pay

## 2022-05-05 ENCOUNTER — Telehealth: Payer: Self-pay

## 2022-05-05 DIAGNOSIS — Z79899 Other long term (current) drug therapy: Secondary | ICD-10-CM | POA: Insufficient documentation

## 2022-05-05 DIAGNOSIS — Z170 Estrogen receptor positive status [ER+]: Secondary | ICD-10-CM | POA: Insufficient documentation

## 2022-05-05 DIAGNOSIS — Z79810 Long term (current) use of selective estrogen receptor modulators (SERMs): Secondary | ICD-10-CM | POA: Insufficient documentation

## 2022-05-05 DIAGNOSIS — C220 Liver cell carcinoma: Secondary | ICD-10-CM | POA: Diagnosis not present

## 2022-05-05 DIAGNOSIS — C50111 Malignant neoplasm of central portion of right female breast: Secondary | ICD-10-CM | POA: Diagnosis not present

## 2022-05-05 LAB — CMP (CANCER CENTER ONLY)
ALT: 21 U/L (ref 0–44)
AST: 44 U/L — ABNORMAL HIGH (ref 15–41)
Albumin: 4.3 g/dL (ref 3.5–5.0)
Alkaline Phosphatase: 73 U/L (ref 38–126)
Anion gap: 9 (ref 5–15)
BUN: 20 mg/dL (ref 6–20)
CO2: 23 mmol/L (ref 22–32)
Calcium: 9.6 mg/dL (ref 8.9–10.3)
Chloride: 104 mmol/L (ref 98–111)
Creatinine: 0.76 mg/dL (ref 0.44–1.00)
GFR, Estimated: >60 mL/min (ref >60)
Glucose, Bld: 108 mg/dL — ABNORMAL HIGH (ref 70–99)
Potassium: 4.5 mmol/L (ref 3.5–5.1)
Sodium: 136 mmol/L (ref 135–145)
Total Bilirubin: 1.8 mg/dL — ABNORMAL HIGH (ref 0.3–1.2)
Total Protein: 6.5 g/dL (ref 6.5–8.1)

## 2022-05-05 LAB — CBC WITH DIFFERENTIAL (CANCER CENTER ONLY)
Abs Immature Granulocytes: 0.09 10*3/uL — ABNORMAL HIGH (ref 0.00–0.07)
Basophils Absolute: 0.1 10*3/uL (ref 0.0–0.1)
Basophils Relative: 1 %
Eosinophils Absolute: 0.1 10*3/uL (ref 0.0–0.5)
Eosinophils Relative: 2 %
HCT: 28 % — ABNORMAL LOW (ref 36.0–46.0)
Hemoglobin: 9.1 g/dL — ABNORMAL LOW (ref 12.0–15.0)
Immature Granulocytes: 1 %
Lymphocytes Relative: 11 %
Lymphs Abs: 0.8 10*3/uL (ref 0.7–4.0)
MCH: 25.3 pg — ABNORMAL LOW (ref 26.0–34.0)
MCHC: 32.5 g/dL (ref 30.0–36.0)
MCV: 78 fL — ABNORMAL LOW (ref 80.0–100.0)
Monocytes Absolute: 1 10*3/uL (ref 0.1–1.0)
Monocytes Relative: 15 %
Neutro Abs: 4.9 10*3/uL (ref 1.7–7.7)
Neutrophils Relative %: 70 %
Platelet Count: 88 10*3/uL — ABNORMAL LOW (ref 150–400)
RBC: 3.59 MIL/uL — ABNORMAL LOW (ref 3.87–5.11)
RDW: 24.7 % — ABNORMAL HIGH (ref 11.5–15.5)
WBC Count: 7 10*3/uL (ref 4.0–10.5)
nRBC: 0 % (ref 0.0–0.2)

## 2022-05-05 NOTE — Telephone Encounter (Signed)
.  Called patient to schedule appointment per 6/13 inbasket, patient is aware of date and time.   

## 2022-05-05 NOTE — Telephone Encounter (Signed)
Spoke with pt via telephone regarding her labs drawn today.  Pt's labs were stable (Plts 88, AST 44, Tbili 1.8, and Scr+ 1.76).  Pt stated she's still a little weak but is moving around with her walker without much SOB.  Pt stated overall she is feeling much better.  Informed pt that someone from our scheduling team will be contacting her for a f/u with [name].

## 2022-05-06 DIAGNOSIS — I7300 Raynaud's syndrome without gangrene: Secondary | ICD-10-CM | POA: Diagnosis not present

## 2022-05-06 DIAGNOSIS — R180 Malignant ascites: Secondary | ICD-10-CM | POA: Diagnosis not present

## 2022-05-06 DIAGNOSIS — E871 Hypo-osmolality and hyponatremia: Secondary | ICD-10-CM | POA: Diagnosis not present

## 2022-05-06 DIAGNOSIS — J9621 Acute and chronic respiratory failure with hypoxia: Secondary | ICD-10-CM | POA: Diagnosis not present

## 2022-05-06 DIAGNOSIS — C220 Liver cell carcinoma: Secondary | ICD-10-CM | POA: Diagnosis not present

## 2022-05-06 DIAGNOSIS — M542 Cervicalgia: Secondary | ICD-10-CM | POA: Diagnosis not present

## 2022-05-06 DIAGNOSIS — Z853 Personal history of malignant neoplasm of breast: Secondary | ICD-10-CM | POA: Diagnosis not present

## 2022-05-06 DIAGNOSIS — Z4801 Encounter for change or removal of surgical wound dressing: Secondary | ICD-10-CM | POA: Diagnosis not present

## 2022-05-06 DIAGNOSIS — D630 Anemia in neoplastic disease: Secondary | ICD-10-CM | POA: Diagnosis not present

## 2022-05-06 DIAGNOSIS — C50919 Malignant neoplasm of unspecified site of unspecified female breast: Secondary | ICD-10-CM | POA: Diagnosis not present

## 2022-05-06 DIAGNOSIS — N179 Acute kidney failure, unspecified: Secondary | ICD-10-CM | POA: Diagnosis not present

## 2022-05-06 DIAGNOSIS — J90 Pleural effusion, not elsewhere classified: Secondary | ICD-10-CM | POA: Diagnosis not present

## 2022-05-06 DIAGNOSIS — B182 Chronic viral hepatitis C: Secondary | ICD-10-CM | POA: Diagnosis not present

## 2022-05-06 DIAGNOSIS — K7460 Unspecified cirrhosis of liver: Secondary | ICD-10-CM | POA: Diagnosis not present

## 2022-05-06 DIAGNOSIS — J449 Chronic obstructive pulmonary disease, unspecified: Secondary | ICD-10-CM | POA: Diagnosis not present

## 2022-05-06 DIAGNOSIS — Z48813 Encounter for surgical aftercare following surgery on the respiratory system: Secondary | ICD-10-CM | POA: Diagnosis not present

## 2022-05-06 DIAGNOSIS — E43 Unspecified severe protein-calorie malnutrition: Secondary | ICD-10-CM | POA: Diagnosis not present

## 2022-05-06 DIAGNOSIS — Z87891 Personal history of nicotine dependence: Secondary | ICD-10-CM | POA: Diagnosis not present

## 2022-05-07 ENCOUNTER — Other Ambulatory Visit: Payer: Self-pay | Admitting: Hematology

## 2022-05-07 DIAGNOSIS — Z9889 Other specified postprocedural states: Secondary | ICD-10-CM

## 2022-05-11 ENCOUNTER — Inpatient Hospital Stay (HOSPITAL_BASED_OUTPATIENT_CLINIC_OR_DEPARTMENT_OTHER): Payer: BC Managed Care – PPO | Admitting: Nurse Practitioner

## 2022-05-11 ENCOUNTER — Other Ambulatory Visit: Payer: Self-pay

## 2022-05-11 ENCOUNTER — Inpatient Hospital Stay: Payer: BC Managed Care – PPO

## 2022-05-11 ENCOUNTER — Encounter: Payer: Self-pay | Admitting: Nurse Practitioner

## 2022-05-11 VITALS — BP 115/74 | HR 110 | Temp 98.0°F | Resp 17 | Ht 63.0 in | Wt 91.6 lb

## 2022-05-11 DIAGNOSIS — C220 Liver cell carcinoma: Secondary | ICD-10-CM | POA: Diagnosis not present

## 2022-05-11 DIAGNOSIS — C50111 Malignant neoplasm of central portion of right female breast: Secondary | ICD-10-CM | POA: Diagnosis not present

## 2022-05-11 DIAGNOSIS — Z170 Estrogen receptor positive status [ER+]: Secondary | ICD-10-CM

## 2022-05-11 DIAGNOSIS — Z79810 Long term (current) use of selective estrogen receptor modulators (SERMs): Secondary | ICD-10-CM | POA: Diagnosis not present

## 2022-05-11 DIAGNOSIS — Z79899 Other long term (current) drug therapy: Secondary | ICD-10-CM | POA: Diagnosis not present

## 2022-05-11 LAB — CMP (CANCER CENTER ONLY)
ALT: 21 U/L (ref 0–44)
AST: 41 U/L (ref 15–41)
Albumin: 3.6 g/dL (ref 3.5–5.0)
Alkaline Phosphatase: 71 U/L (ref 38–126)
Anion gap: 4 — ABNORMAL LOW (ref 5–15)
BUN: 23 mg/dL — ABNORMAL HIGH (ref 6–20)
CO2: 23 mmol/L (ref 22–32)
Calcium: 8.9 mg/dL (ref 8.9–10.3)
Chloride: 110 mmol/L (ref 98–111)
Creatinine: 0.64 mg/dL (ref 0.44–1.00)
GFR, Estimated: >60 mL/min (ref >60)
Glucose, Bld: 129 mg/dL — ABNORMAL HIGH (ref 70–99)
Potassium: 4.1 mmol/L (ref 3.5–5.1)
Sodium: 137 mmol/L (ref 135–145)
Total Bilirubin: 1.7 mg/dL — ABNORMAL HIGH (ref 0.3–1.2)
Total Protein: 6.5 g/dL (ref 6.5–8.1)

## 2022-05-11 LAB — CBC WITH DIFFERENTIAL (CANCER CENTER ONLY)
Abs Immature Granulocytes: 0.02 10*3/uL (ref 0.00–0.07)
Basophils Absolute: 0.1 10*3/uL (ref 0.0–0.1)
Basophils Relative: 1 %
Eosinophils Absolute: 0.2 10*3/uL (ref 0.0–0.5)
Eosinophils Relative: 3 %
HCT: 30.2 % — ABNORMAL LOW (ref 36.0–46.0)
Hemoglobin: 9.4 g/dL — ABNORMAL LOW (ref 12.0–15.0)
Immature Granulocytes: 0 %
Lymphocytes Relative: 9 %
Lymphs Abs: 0.6 10*3/uL — ABNORMAL LOW (ref 0.7–4.0)
MCH: 25.3 pg — ABNORMAL LOW (ref 26.0–34.0)
MCHC: 31.1 g/dL (ref 30.0–36.0)
MCV: 81.2 fL (ref 80.0–100.0)
Monocytes Absolute: 1.1 10*3/uL — ABNORMAL HIGH (ref 0.1–1.0)
Monocytes Relative: 14 %
Neutro Abs: 5.4 10*3/uL (ref 1.7–7.7)
Neutrophils Relative %: 73 %
Platelet Count: 82 10*3/uL — ABNORMAL LOW (ref 150–400)
RBC: 3.72 MIL/uL — ABNORMAL LOW (ref 3.87–5.11)
RDW: 26.5 % — ABNORMAL HIGH (ref 11.5–15.5)
WBC Count: 7.3 10*3/uL (ref 4.0–10.5)
nRBC: 0 % (ref 0.0–0.2)

## 2022-05-11 NOTE — Progress Notes (Signed)
[place]   [phone]) [phone] Fax:(336) [phone]   Clinic Follow up Note   Patient Care Team: Seward Carol, MD as PCP - General (Internal Medicine) Arna Snipe, RN as Oncology Nurse Navigator Henrene Pastor, Docia Chuck, MD as Consulting Physician (Gastroenterology) Truitt Merle, MD as Consulting Physician (Hematology) Alla Feeling, NP as Nurse Practitioner (Nurse Practitioner) Stark Klein, MD as Consulting Physician (General Surgery) Aletta Edouard, MD as Consulting Physician (Interventional Radiology) Roosevelt Locks, CRNP as Nurse Practitioner (Nurse Practitioner) Mauro Kaufmann, RN as Oncology Nurse Navigator Rockwell Germany, RN as Oncology Nurse Navigator Eppie Gibson, MD as Attending Physician (Radiation Oncology) Trula Slade, DPM as Consulting Physician (Podiatry) 05/11/2022  CHIEF COMPLAINT: Hospital f/up, h/o right breast cancer and multifocal Fallis   SUMMARY OF ONCOLOGIC HISTORY: Oncology History Overview Note  Cancer Staging Hepatocellular carcinoma [facility]) Staging form: Liver, AJCC 8th Edition - Clinical stage from 08/02/2019: Stage II (cT2, cN0, cM0) - Signed by Truitt Merle, MD on 08/02/2019  Malignant neoplasm of central portion of right breast in female, estrogen receptor positive ([place]) Staging form: Breast, AJCC 8th Edition - Clinical stage from 08/29/2019: Stage IB (cT2, cN0, cM0, G2, ER+, PR+, HER2-) - Signed by Truitt Merle, MD on 09/05/2019    Hepatocellular carcinoma ([place])  07/04/2019 Imaging   CT AP IMPRESSION: 1. Findings of cirrhosis and hepatomegaly.  Patent portal vein. 2. Numerous varicosities as described above. 3. Moderate to large amount of abdominopelvic ascites 4. Contrast filled appendix which appears to be a mildly dilated with apparent wall thickening which could be due to surrounding ascites.   07/09/2019 Imaging   ABD US IMPRESSION: 1. Three small hypoechoic lesions in the RIGHT hepatic lobe measuring less than 1 cm but not  evident on comparison contrast enhanced CT from 07/04/2019. Recommend MRI of the abdomen without and with contrast to evaluate these hepatic lesions in cirrhotic patient. 2. Increased liver echogenicity and lobular contour consistent cirrhosis. 3. Moderate mild to moderate volume of ascites. 4. Mild gallbladder wall thickening likely related to ascites. 5. No cholelithiasis or cholecystitis evident   07/26/2019 Imaging   MR ABD IMPRESSION: 1. In the lateral segment left hepatic lobe there are two LI-RADS category LR-5 lesions representing hepatocellular carcinoma. 2. There is also a LI-RADS category LR-3 lesion peripherally in the right hepatic lobe, intermediate probability for hepatocellular carcinoma. 3. Hepatic cirrhosis and diffuse wall thickening of the gallbladder. Widespread steatosis in the liver. 4. 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 5.  Aortic Atherosclerosis (ICD10-I70.0). 6. Widespread ascites and mesenteric edema.   07/26/2019 Tumor Marker   AFP 5.5 (baseline)   08/02/2019 Initial Diagnosis   Hepatocellular carcinoma ([place])   08/02/2019 Cancer Staging   Staging form: Liver, AJCC 8th Edition - Clinical stage from 08/02/2019: Stage II (cT2, cN0, cM0) - Signed by Truitt Merle, MD on 08/02/2019   08/07/2019 Imaging   CT Chest 08/07/19 IMPRESSION: 1. Tiny bilateral pulmonary nodules measuring up to 6 mm. These are too small to definitively characterize. Given the history of HCC, close follow-up recommended to ensure stability. 2.  Emphysema. (ICD10-J43.9)   08/09/2019 Procedure   Upper Endoscopy by Dr Henrene Pastor 08/09/19  IMPRESSION 1. Small esophageal varices 2. Mild diffuse portal hypertensive gastropathy 3. Otherwise normal EGD.   02/04/2020 Imaging   MRI abdomen  IMPRESSION: Stable exam. The small segment II lesions identified previously as LI-RADS category 5 are stable in size.   Previously characterized tiny LI-RADS 3 lesion in the inferior  right liver  is stable.   No new suspicious focal hepatic lesion on a background liver morphology consistent with cirrhosis.   Interval decrease in ascites with decrease and diffuse gallbladder wall thickening.     06/18/2020 Imaging   MRI Abdomen  IMPRESSION: 1. Stable 1.1 x 0.9 cm rounded lesion in the medial left lobe of the liver, hepatic segment II, demonstrating arterial phase hyperenhancement without washout or capsule. This has previously been described as a LI-RADS category 5 lesion based on prior observations, if categorized by current imaging features this is consistent with LI-RADS category 4. 2. Stable 1.5 x 1.2 cm lesion lateral and inferior to this lesion in hepatic segment II with very subtle evidence of washout but without capsular enhancement. LI-RADS category 5. 3. Stable 5 mm focus of early arterial phase hyperenhancement of the inferior right lobe of the liver, hepatic segment VI, without evidence of washout or capsule. This remains consistent with LI-RADS category 3. 4. No new suspicious lesions or contrast enhancement. 5. Cirrhotic morphology of the liver. 6. Trace perihepatic ascites.     10/30/2021 Imaging   EXAM: MRI ABDOMEN WITHOUT AND WITH CONTRAST  IMPRESSION: 1. Ablation defect of the left lobe of the liver which subtends two lesions previously noted in hepatic segment II. No evidence of residual contrast enhancement to suggest viable tumor.  2. Significant interval increase in size of a hyperenhancing lesion of the posterior liver dome, hepatic segment VII, measuring 4.0 x 3.7 cm, previously 2.6 x 2.5 cm when measured similarly. This demonstrates some heterogeneous contrast enhancement although no clear evidence of washout or capsular enhancement. Findings remain LI-RADS category 5, consistent with hepatocellular carcinoma. 3. Slight interval increase in size of a hyperenhancing lesion of the inferior tip of the right lobe of the liver, hepatic  segment VI, measuring 1.1 x 0.7 cm, previously no greater than 0.6 cm, as well as an additional adjacent lesion measuring 0.6 cm, previously 0.4 cm. Given threshold growth, these lesions are upstaged to LI-RADS category 4 and suspicious for additional small foci of hepatocellular carcinoma. 4. Cirrhotic morphology of the liver and hepatic steatosis. 5. Small volume ascites throughout the abdomen. 6. Large left, small right pleural effusions and associated atelectasis or consolidation. 7. Layering sludge in the gallbladder with mild gallbladder wall thickening and pericholecystic fluid, similar to prior examination although nonspecific in the setting of ascites. No discrete gallstones.   11/10/2021 Imaging   EXAM: CT ABDOMEN WITHOUT AND WITH CONTRAST  IMPRESSION: Post treatment changes about the LEFT hepatic lobe lesion with variable arterial enhancement not showing washout and with non masslike features, favor perfusional defect, characterized as TR equivocal but favor nonviable.   Lesion in the posterior RIGHT hepatic lobe over time with enlargement and with serpiginous internal low signal on MR and low attenuation on CT. Findings categorized as LR M given signs of rim like enhancement on previous imaging and infiltrative appearance. Potentially infiltrative HCC but without specific imaging features of hepatocellular carcinoma by LI-RADS.   Enlarging lesion in the inferior RIGHT hepatic lobe greater than 50% diameter increase over 6 month interval, LI-RADS category 5 showing non rim arterial phase enhancement.   Additional foci of arterial phase enhancement without definitive washout, categorized as LI-RADS category 3 but in aggregate are more suspicious for small foci of hepatocellular carcinoma, attention on follow-up.   11/19/2021 Pathology Results   A. PLEURAL FLUID, LEFT, THORACENTESIS:   FINAL MICROSCOPIC DIAGNOSIS:  - No malignant cells identified  - Reactive mesothelial cells  present  12/15/2021 PET scan   IMPRESSION: 1. Intensely hypermetabolic mass in the posterior RIGHT hepatic lobe consistent with known hepatocellular carcinoma. Smaller lesions identified on MRI are not hypermetabolic. 2. No evidence of metastatic lymphadenopathy or distant metastatic disease. 3. Elongated nodular thickening in the lingula is favored benign. Moderate LEFT pleural effusion.   Malignant neoplasm of central portion of right breast in female, estrogen receptor positive ([place])  08/29/2019 Cancer Staging   Staging form: Breast, AJCC 8th Edition - Clinical stage from 08/29/2019: Stage IB (cT2, cN0, cM0, G2, ER+, PR+, HER2-) - Signed by Truitt Merle, MD on 09/05/2019   08/29/2019 Mammogram   Diagnostic mammogram 08/29/19 IMPRESSION: Suspicious mass in the 12 o'clock region of the right breast 3 cm from the nipple measuring 3.2 x 1.6 x 2.6 cm   08/29/2019 Initial Biopsy   Diagnosis 08/29/19 Breast, right, needle core biopsy, 12 o'clock - INVASIVE DUCTAL CARCINOMA, SEE COMMENT.   08/29/2019 Receptors her2   Results: IMMUNOHISTOCHEMICAL AND MORPHOMETRIC ANALYSIS PERFORMED MANUALLY The tumor cells are NEGATIVE for Her2 (1+). Estrogen Receptor: 100%, POSITIVE, STRONG STAINING INTENSITY Progesterone Receptor: 90%, POSITIVE, STRONG STAINING INTENSITY Proliferation Marker Ki67: 15%   09/05/2019 Initial Diagnosis   Malignant neoplasm of central portion of right breast in female, estrogen receptor positive ([place])   09/13/2019 Breast MRI   MRI breast 09/13/19 IMPRESSION: 1. Irregular spiculated enhancing mass in the right breast representing the patient's known malignancy. No abnormal right axillary lymph nodes. 2. Prominent left internal mammary lymph node is indeterminate. No abnormal appearing right internal mammary lymph nodes. 3. No evidence of malignancy in the left breast.   10/16/2019 Cancer Staging   Staging form: Breast, AJCC 8th Edition - Pathologic stage from 10/16/2019:  Stage IA (pT2, pN0, cM0, G2, ER+, PR+, HER2-, Oncotype DX score: 21) - Signed by Gardenia Phlegm, NP on 11/07/2019   10/16/2019 Oncotype testing   Oncotype 10/16/19 Recurrence score 21 with 7% benefit of Tamoxifen or AI. There is less than 1 % benefit of chemo.    10/16/2019 Surgery   RIGHT BREAST LUMPECTOMY WITH SENTINEL LYMPH NODE BIOPSY by Dr Barry Dienes 10/16/19    10/16/2019 Pathology Results   FINAL MICROSCOPIC DIAGNOSIS: 10/16/19  A. BREAST, RIGHT, LUMPECTOMY:  - Invasive ductal carcinoma, 2.3 cm, Nottingham grade 2 of 3.  - Margins of resection are not involved (Closest margin: 1 mm,  anterior).  - See oncology table.   B. SENTINEL LYMPH NODE, RIGHT AXILLARY #1, BIOPSY:  - One lymph node, negative for carcinoma (0/1).   C. SENTINEL LYMPH NODE, RIGHT AXILLARY #2, BIOPSY:  - One lymph node, negative for carcinoma (0/1).    09/2019 -  Anti-estrogen oral therapy   Anastrozole $RemoveBefo'1mg'WdQcLJzKxud$  daily started in Nov 2020. Due to worsened knee pain/arthritis, she was switched to Tamoxifen in 03/2020.    12/03/2019 - 12/31/2019 Radiation Therapy   Adjuvant RT with Dr Isidore Moos 12/03/19-12/31/19   04/01/2020 Survivorship   SCP delivered by virtual visit per Cira Rue, NP      CURRENT THERAPY:  Breast cancer: anastrozole 09/2019, switched to tamoxifen 03/2022 due to arthralgia  HCC: s/p ablation 09/16/21 and Y90 12/22/21, currently on surveillance   INTERVAL HISTORY: Ms. Oates presents for follow-up as scheduled.  Last seen by [name] in clinic 04/08/2022 and was doing well from a cancer standpoint.  She was subsequently admitted to the hospital 04/14/2022 for hyponatremia, shortness of breath, and hypotension.  Work-up showed recurrent pleural effusion, initial labs showed sodium 124 (lowest 119) and potassium  6.0, diuretics were held and she was rehydrated.  PleurX drained 1 L.  GI saw her who did not recommend TIPS given lack of guaranteed benefit. Nephrology was consulted first for hyponatremia and  reconsulted 5/30 for AKI, felt to be hepatorenal syndrome, started on octreotide 100 mcg 3 times daily which was not continued at discharge.  Eventually discharged 05/03/2022.  Follow-up lab in our clinic 05/05/2022 showed normal NA 136, K 4.5, and creatinine 0.76.  T. bili at baseline 1.8.  She presents today for close follow-up. Feeling much better, getting stronger.  She has some insomnia waking up at 2 AM so she is cleaning.  Eventually took oxycodone last night which did help her sleep.  She has had whole body shaking as well, which has improved in her legs as she is gotten stronger but still shaking in her hands.  She is having small BMs not daily, feels constipated.  She has mild shortness of breath in the mornings, improves after Pleurx Evac drainage which puts out 500 cc/day lately.  She has left scapular pain which has improved with her activity but thinks maybe she slept on it wrong.  All other systems were reviewed with the patient and are negative.  MEDICAL HISTORY:  Past Medical History:  Diagnosis Date   Anemia    Breast cancer ([place]) 08/30/2019   Cancer ([place])    liver- just diagnosed, not started treatment yet   Cervicalgia    COPD (chronic obstructive pulmonary disease) (HCC)    Esophageal varices (HCC)    GERD (gastroesophageal reflux disease)    Headache    Hepatic cirrhosis (HCC)    Hepatitis C    resolved 2018   Hepatocellular carcinoma (HCC)    IBS (irritable bowel syndrome)    Lactose intolerance    Personal history of radiation therapy 11/2019   Right breast   Portal hypertension ([place])    Raynaud's syndrome    Thrombocytopenia ([place])     SURGICAL HISTORY: Past Surgical History:  Procedure Laterality Date   BREAST BIOPSY Right 09/2019   BREAST LUMPECTOMY Right 09/2019   BREAST LUMPECTOMY WITH AXILLARY LYMPH NODE BIOPSY Right 10/16/2019   Procedure: RIGHT BREAST LUMPECTOMY WITH SENTINEL LYMPH NODE BIOPSY;  Surgeon: Stark Klein, MD;  Location: [facility];  Service:  General;  Laterality: Right;   COLONOSCOPY     ESOPHAGEAL BANDING  10/27/2021   Procedure: ESOPHAGEAL BANDING;  Surgeon: Irene Shipper, MD;  Location: [facility];  Service: Endoscopy;;   ESOPHAGOGASTRODUODENOSCOPY (EGD) WITH PROPOFOL N/A 10/27/2021   Procedure: ESOPHAGOGASTRODUODENOSCOPY (EGD) WITH PROPOFOL;  Surgeon: Irene Shipper, MD;  Location: [facility];  Service: Endoscopy;  Laterality: N/A;   ESOPHAGOGASTRODUODENOSCOPY (EGD) WITH PROPOFOL N/A 01/04/2022   Procedure: ESOPHAGOGASTRODUODENOSCOPY (EGD) WITH PROPOFOL;  Surgeon: Irene Shipper, MD;  Location: [facility];  Service: Endoscopy;  Laterality: N/A;   HAMMER TOE SURGERY Left 2012   pins placed in great toe, 2nd,3rd, 4th toes, all pins removed except great toe   IR ANGIOGRAM SELECTIVE EACH ADDITIONAL VESSEL  12/16/2021   IR ANGIOGRAM SELECTIVE EACH ADDITIONAL VESSEL  12/16/2021   IR ANGIOGRAM SELECTIVE EACH ADDITIONAL VESSEL  12/22/2021   IR ANGIOGRAM VISCERAL SELECTIVE  12/16/2021   IR ANGIOGRAM VISCERAL SELECTIVE  12/22/2021   IR EMBO TUMOR ORGAN ISCHEMIA INFARCT INC GUIDE ROADMAPPING  12/22/2021   IR PARACENTESIS  11/13/2019   IR PARACENTESIS  03/04/2022   IR PERC PLEURAL DRAIN W/INDWELL CATH W/IMG GUIDE  03/09/2022   IR RADIOLOGIST EVAL &  MGMT  08/16/2019   IR RADIOLOGIST EVAL & MGMT  02/06/2020   IR RADIOLOGIST EVAL & MGMT  06/26/2020   IR RADIOLOGIST EVAL & MGMT  12/31/2020   IR RADIOLOGIST EVAL & MGMT  07/21/2021   IR RADIOLOGIST EVAL & MGMT  10/08/2021   IR RADIOLOGIST EVAL & MGMT  01/13/2022   IR RADIOLOGIST EVAL & MGMT  03/05/2022   IR THORACENTESIS ASP PLEURAL SPACE W/IMG GUIDE  10/12/2021   IR THORACENTESIS ASP PLEURAL SPACE W/IMG GUIDE  10/26/2021   IR THORACENTESIS ASP PLEURAL SPACE W/IMG GUIDE  11/19/2021   IR THORACENTESIS ASP PLEURAL SPACE W/IMG GUIDE  12/22/2021   IR US GUIDE VASC ACCESS RIGHT  12/16/2021   IR US GUIDE VASC ACCESS RIGHT  12/22/2021   PELVIC LAPAROSCOPY Bilateral 1992   RADIOLOGY WITH ANESTHESIA N/A  09/16/2021   Procedure: CT MICROWAVE ABLATION;  Surgeon: Aletta Edouard, MD;  Location: [facility];  Service: Radiology;  Laterality: N/A;   UPPER GASTROINTESTINAL ENDOSCOPY  2002   had esophageal dilitation    I have reviewed the social history and family history with the patient and they are unchanged from previous note.  ALLERGIES:  is allergic to hydromorphone hcl and aspirin.  MEDICATIONS:  Current Outpatient Medications  Medication Sig Dispense Refill   baclofen (LIORESAL) 10 MG tablet Take 5 mg by mouth daily as needed for muscle spasms.     dicyclomine (BENTYL) 20 MG tablet Take 0.5 tablets (10 mg total) by mouth every 6 (six) hours as needed for spasms. Take one tablet every 6-8 hours as needed for abdominal cramping (Patient taking differently: Take 20 mg by mouth daily as needed (abdominal cramping).) 90 tablet 0   lactulose (CHRONULAC) 10 GM/15ML solution Take 15 mLs (10 g total) by mouth 2 (two) times daily as needed for mild constipation. 900 mL 2   midodrine (PROAMATINE) 5 MG tablet Take 3 tablets (15 mg total) by mouth 3 (three) times daily with meals. 270 tablet 2   nicotine (NICODERM CQ - DOSED IN MG/24 HOURS) 21 mg/24hr patch Place 21 mg onto the skin daily as needed (smoking cessation on work days).     oxyCODONE (OXY IR/ROXICODONE) 5 MG immediate release tablet Take 1-2 tablets (5-10 mg total) by mouth every 4 (four) hours as needed for severe pain. 60 tablet 0   pantoprazole (PROTONIX) 40 MG tablet Take 1 tablet (40 mg total) by mouth daily. 90 tablet 1   promethazine (PHENERGAN) 12.5 MG tablet Take 1 tablet (12.5 mg total) by mouth every 6 (six) hours as needed for nausea or vomiting. 30 tablet 0   tamoxifen (NOLVADEX) 20 MG tablet TAKE 1 TABLET BY MOUTH EVERY DAY (Patient taking differently: Take 20 mg by mouth every evening.) 90 tablet 1   No current facility-administered medications for this visit.    PHYSICAL EXAMINATION: ECOG PERFORMANCE STATUS: 1 - Symptomatic but  completely ambulatory  Vitals:   05/11/22 1135  BP: 115/74  Pulse: (!) 110  Resp: 17  Temp: 98 F (36.7 C)  SpO2: 97%   Filed Weights   05/11/22 1135  Weight: 91 lb 9.6 oz (41.5 kg)    GENERAL:alert, no distress and comfortable SKIN: Scattered petechiae on bilateral forearms.  No jaundice EYES: sclera clear LUNGS: Decreased throughout, no adventitious sounds.  Normal breathing effort HEART: Mild tachycardia, regular rhythm, no lower extremity edema ABDOMEN:abdomen soft and normal bowel sounds.  Mild lower abdomen TTP NEURO: alert & oriented x 3 with fluent speech, no focal  motor/sensory deficits Breast exam deferred  LABORATORY DATA:  I have reviewed the data as listed    Latest Ref Rng & Units 05/11/2022   11:05 AM 05/05/2022   10:31 AM 05/03/2022    3:04 AM  CBC  WBC 4.0 - 10.5 K/uL 7.3  7.0  4.3   Hemoglobin 12.0 - 15.0 g/dL 9.4  9.1  8.9   Hematocrit 36.0 - 46.0 % 30.2  28.0  28.1   Platelets 150 - 400 K/uL 82  88  108         Latest Ref Rng & Units 05/11/2022   11:05 AM 05/05/2022   10:31 AM 05/03/2022    3:04 AM  CMP  Glucose 70 - 99 mg/dL 129  108  103   BUN 6 - 20 mg/dL $Remove'23  20  22   'MKzDbul$ Creatinine 0.44 - 1.00 mg/dL 0.64  0.76  0.91   Sodium 135 - 145 mmol/L 137  136  138   Potassium 3.5 - 5.1 mmol/L 4.1  4.5  4.8   Chloride 98 - 111 mmol/L 110  104  109   CO2 22 - 32 mmol/L $RemoveB'23  23  24   'emmXDFkU$ Calcium 8.9 - 10.3 mg/dL 8.9  9.6  8.8   Total Protein 6.5 - 8.1 g/dL 6.5  6.5    Total Bilirubin 0.3 - 1.2 mg/dL 1.7  1.8    Alkaline Phos 38 - 126 U/L 71  73    AST 15 - 41 U/L 41  44    ALT 0 - 44 U/L 21  21        RADIOGRAPHIC STUDIES: I have personally reviewed the radiological images as listed and agreed with the findings in the report. No results found.   ASSESSMENT & PLAN: Theresa Rocha is a 56 y.o. female with     Recent hospitalization 5/17 - 05/03/22: Hyponatremia, hyperkalemia, AKI, hepatorenal syndrome, recurrent pleural effusion -She developed left pleural  effusion following liver ablation for [facility] on 09/16/2021, she had a Pleurx. -She has had stable hyponatremia in the past which we are following closely -Last seen in clinic by [name] for breast cancer and [facility] surveillance, was doing well.  She subsequently developed shortness of breath and was found to have hyponatremia and a 119, sent here for admission -Nephrology, GI, cardiothoracic surgery, and oncology were following -Per Dr. Kipp Brood she is not a candidate for pleurodesis, Pleurx was attached to [facility], currently puts out 500 cc/day, dyspnea has improved -She was given midodrine, albumin, and octreotide in the hospital, octreotide was not continued as an outpatient -GI saw her who did not recommend TIPS -Discharge 05/03/2022, labs have remained stable, NA, K, and creatinine have normalized.  T. bili and CBC at baseline -currently off diuretics except PRN lasix. BP improved. -PCP follow-up 6/16 for close monitoring  2.  Multifocal Hepatocellular carcinoma, cT2N0Mx, stage II -presented with abdominal pain and rectal bleeding in 06/2019. Work up scans showed multifocal lesions of liver consistent with [place] with indeterminate right lobe lesions. AFP was WNL. -she was found to not be a transplant candidate due to history of breast cancer and multifocal HCC beyond Milan criteria. She was also not felt to be a surgical candidate due to her liver function and location of tumors. She has been on surveillance. -she was initially not felt to be a good candidate for liver-targeted ablation. She was recently found to have enlarging left lobe liver lesions on surveillance CT in 06/2021. She proceeded  with left lobe ablation on 09/16/21 with Dr. Kathlene Cote. -PET on 12/15/21 showed hypermetabolism only to the right hepatic lobe, no other distant metastasis. She underwent right lobe Y90 on 12/22/21 with Dr. Kathlene Cote. -most recent abdomen MRI on 02/26/22 showed: decreased size of bilateral liver lesions, both considered  LR TR equivocal; no new lesions. -repeat MRI 05/2022 prior to next visit    3. Malignant neoplasm of central portion of right breast, Stage II, c(T2N0M0), ER/PR+, HER2-, Grade II, RS 21 -diagnosed in 07/2019, s/p right lumpectomy with SNLB. Recurrence Score 21 (low risk), adjuvant chemotherapy was not recommended -She completed adjuvant RT with Dr Isidore Moos 12/03/19-12/31/19 -She started anastrozole in 09/2019. Due to knee joint pain, she was switched to Tamoxifen in 03/2020. She is tolerating with hot flashes. Will continue to complete 10 years.  -most recent mammogram from 09/2020 was negative.  Next mammogram scheduled 05/14/2022 -From a breast cancer standpoint, She is clinically doing well.  Continue surveillance  4. 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  -s/p harvoni and ribavirin treatment for Hep C per Dr. Bradd Burner in 2018 -EGD on 10/27/21 by Dr. Henrene Pastor while hospitalized showed esophageal varices, which were banded at that time. No residual varices on repeat EGD on 01/04/22. -Continue to F/u with Dr Henrene Pastor and [facility]   5. Alcohol and smoking Cessation  -She has h/o smoking 0.5 PPD x35 years. She drank 3-4 cans beer nightly x20 years. -Cessation of both was discussed and strongly encouraged. She has stopped drinking alcohol but smokes 5 cigarettes a day still. She did not smoke while hospitalized, but picked it up again when she got home. -Uses a nicotine patch when she is at work  6. Osteoporosis, Hypocalcemia  -DEXA from 09/21/19 at her GYN office showed osteoporosis T score -3.1 at [facility].   -I started her on Zometa q43months for 2 years on 07/17/20. Held before 3rd dose on 05/01/21, due to mouth sores. She has full sets of dentures and has not seen dentist in years. -I previously recommended her to start Calcium supplement daily    7. Thrombocytopenia, [place] -secondary to liver cirrhosis, splenomegaly,  and/or varices -slowly improving, hgb 11.9 and plt 106k today (04/08/22)   8. Hyponatremia  -Secondary to diuretics and pleural effusion draining -We will monitor closely    PLAN: -today's labs and hospital course reviewed, continue home regimen -restart lactulose for constipation -f/up PCP and mammogram 6/16 -f/up Dr. Wyvonnia Lora 6/30 as scheduled -MRI before next f/up with [name] 7/12 -case reviewed with [name]    Orders Placed This Encounter  Procedures   MR Abdomen W Wo Contrast    Standing Status:   Future    Standing Expiration Date:   05/12/2023    Order Specific Question:   If indicated for the ordered procedure, I authorize the administration of contrast media per Radiology protocol    Answer:   Yes    Order Specific Question:   What is the patient's sedation requirement?    Answer:   No Sedation    Order Specific Question:   Does the patient have a pacemaker or implanted devices?    Answer:   No    Order Specific Question:   Preferred imaging location?    Answer:   [facility] (table limit - 550 lbs)   All questions were answered. The patient knows to call the clinic with any problems, questions  or concerns. No barriers to learning was detected. I spent 250 minutes counseling the patient face to face. The total time spent in the appointment was 40 minutes and more than 50% was on counseling and review of test results     Alla Feeling, NP 05/11/22

## 2022-05-12 DIAGNOSIS — Z4801 Encounter for change or removal of surgical wound dressing: Secondary | ICD-10-CM | POA: Diagnosis not present

## 2022-05-12 DIAGNOSIS — E43 Unspecified severe protein-calorie malnutrition: Secondary | ICD-10-CM | POA: Diagnosis not present

## 2022-05-12 DIAGNOSIS — B182 Chronic viral hepatitis C: Secondary | ICD-10-CM | POA: Diagnosis not present

## 2022-05-12 DIAGNOSIS — E871 Hypo-osmolality and hyponatremia: Secondary | ICD-10-CM | POA: Diagnosis not present

## 2022-05-12 DIAGNOSIS — I7300 Raynaud's syndrome without gangrene: Secondary | ICD-10-CM | POA: Diagnosis not present

## 2022-05-12 DIAGNOSIS — N179 Acute kidney failure, unspecified: Secondary | ICD-10-CM | POA: Diagnosis not present

## 2022-05-12 DIAGNOSIS — J9621 Acute and chronic respiratory failure with hypoxia: Secondary | ICD-10-CM | POA: Diagnosis not present

## 2022-05-12 DIAGNOSIS — Z48813 Encounter for surgical aftercare following surgery on the respiratory system: Secondary | ICD-10-CM | POA: Diagnosis not present

## 2022-05-12 DIAGNOSIS — D630 Anemia in neoplastic disease: Secondary | ICD-10-CM | POA: Diagnosis not present

## 2022-05-12 DIAGNOSIS — J449 Chronic obstructive pulmonary disease, unspecified: Secondary | ICD-10-CM | POA: Diagnosis not present

## 2022-05-12 DIAGNOSIS — M542 Cervicalgia: Secondary | ICD-10-CM | POA: Diagnosis not present

## 2022-05-12 DIAGNOSIS — C220 Liver cell carcinoma: Secondary | ICD-10-CM | POA: Diagnosis not present

## 2022-05-12 DIAGNOSIS — Z853 Personal history of malignant neoplasm of breast: Secondary | ICD-10-CM | POA: Diagnosis not present

## 2022-05-12 DIAGNOSIS — J90 Pleural effusion, not elsewhere classified: Secondary | ICD-10-CM | POA: Diagnosis not present

## 2022-05-12 DIAGNOSIS — Z87891 Personal history of nicotine dependence: Secondary | ICD-10-CM | POA: Diagnosis not present

## 2022-05-12 DIAGNOSIS — K7460 Unspecified cirrhosis of liver: Secondary | ICD-10-CM | POA: Diagnosis not present

## 2022-05-13 ENCOUNTER — Ambulatory Visit
Admission: RE | Admit: 2022-05-13 | Discharge: 2022-05-13 | Disposition: A | Discharge status: Home / Self Care | Payer: BC Managed Care – PPO | Source: Ambulatory Visit | Attending: Hematology | Admitting: Hematology

## 2022-05-13 DIAGNOSIS — C220 Liver cell carcinoma: Secondary | ICD-10-CM | POA: Diagnosis not present

## 2022-05-13 DIAGNOSIS — I8511 Secondary esophageal varices with bleeding: Secondary | ICD-10-CM | POA: Diagnosis not present

## 2022-05-13 DIAGNOSIS — Z9889 Other specified postprocedural states: Secondary | ICD-10-CM

## 2022-05-13 DIAGNOSIS — R921 Mammographic calcification found on diagnostic imaging of breast: Secondary | ICD-10-CM | POA: Diagnosis not present

## 2022-05-13 DIAGNOSIS — K7460 Unspecified cirrhosis of liver: Secondary | ICD-10-CM | POA: Diagnosis not present

## 2022-05-13 DIAGNOSIS — K767 Hepatorenal syndrome: Secondary | ICD-10-CM | POA: Diagnosis not present

## 2022-05-18 ENCOUNTER — Telehealth: Payer: Self-pay

## 2022-05-18 ENCOUNTER — Other Ambulatory Visit (HOSPITAL_COMMUNITY): Payer: Self-pay | Admitting: Interventional Radiology

## 2022-05-18 DIAGNOSIS — D630 Anemia in neoplastic disease: Secondary | ICD-10-CM | POA: Diagnosis not present

## 2022-05-18 DIAGNOSIS — Z4801 Encounter for change or removal of surgical wound dressing: Secondary | ICD-10-CM | POA: Diagnosis not present

## 2022-05-18 DIAGNOSIS — J449 Chronic obstructive pulmonary disease, unspecified: Secondary | ICD-10-CM | POA: Diagnosis not present

## 2022-05-18 DIAGNOSIS — R0602 Shortness of breath: Secondary | ICD-10-CM

## 2022-05-18 DIAGNOSIS — C220 Liver cell carcinoma: Secondary | ICD-10-CM | POA: Diagnosis not present

## 2022-05-18 DIAGNOSIS — E871 Hypo-osmolality and hyponatremia: Secondary | ICD-10-CM | POA: Diagnosis not present

## 2022-05-18 DIAGNOSIS — N179 Acute kidney failure, unspecified: Secondary | ICD-10-CM | POA: Diagnosis not present

## 2022-05-18 DIAGNOSIS — K7460 Unspecified cirrhosis of liver: Secondary | ICD-10-CM | POA: Diagnosis not present

## 2022-05-18 DIAGNOSIS — Z48813 Encounter for surgical aftercare following surgery on the respiratory system: Secondary | ICD-10-CM | POA: Diagnosis not present

## 2022-05-18 DIAGNOSIS — Z853 Personal history of malignant neoplasm of breast: Secondary | ICD-10-CM | POA: Diagnosis not present

## 2022-05-18 DIAGNOSIS — M542 Cervicalgia: Secondary | ICD-10-CM | POA: Diagnosis not present

## 2022-05-18 DIAGNOSIS — I7300 Raynaud's syndrome without gangrene: Secondary | ICD-10-CM | POA: Diagnosis not present

## 2022-05-18 DIAGNOSIS — J9621 Acute and chronic respiratory failure with hypoxia: Secondary | ICD-10-CM | POA: Diagnosis not present

## 2022-05-18 DIAGNOSIS — B182 Chronic viral hepatitis C: Secondary | ICD-10-CM | POA: Diagnosis not present

## 2022-05-18 DIAGNOSIS — E43 Unspecified severe protein-calorie malnutrition: Secondary | ICD-10-CM | POA: Diagnosis not present

## 2022-05-18 DIAGNOSIS — J90 Pleural effusion, not elsewhere classified: Secondary | ICD-10-CM | POA: Diagnosis not present

## 2022-05-18 DIAGNOSIS — Z87891 Personal history of nicotine dependence: Secondary | ICD-10-CM | POA: Diagnosis not present

## 2022-05-18 NOTE — Telephone Encounter (Signed)
This patient states that she has been having back pain ever since the Pleurex drain has been put in.  She states that the pain is constant and feels that it may be pressing on a nerve.  She spoke with her primary care and he told her to keep taking the baclofen and it is not helping and he is not addressing it.  She reached out to IR who placed the drain and she is waiting to hear back from them.  Oxycodone does help the pain but she does not want to keep depending on them.  Would like to know if there is something else that the provider can recommend for the pain.  She also states that she is having trouble sleeping at night and the trazodone makes her restless leg syndrome very bad.   This nurse advised that she will forward patient request to the provider. No further concerns at this time.

## 2022-05-19 ENCOUNTER — Other Ambulatory Visit (HOSPITAL_COMMUNITY): Payer: Self-pay | Admitting: Interventional Radiology

## 2022-05-19 ENCOUNTER — Ambulatory Visit (HOSPITAL_COMMUNITY)
Admission: RE | Admit: 2022-05-19 | Discharge: 2022-05-19 | Disposition: A | Discharge status: Home / Self Care | Payer: BC Managed Care – PPO | Source: Ambulatory Visit | Attending: Interventional Radiology | Admitting: Interventional Radiology

## 2022-05-19 ENCOUNTER — Encounter (HOSPITAL_COMMUNITY): Payer: Self-pay | Admitting: Radiology

## 2022-05-19 ENCOUNTER — Other Ambulatory Visit: Payer: Self-pay

## 2022-05-19 DIAGNOSIS — R0602 Shortness of breath: Secondary | ICD-10-CM

## 2022-05-19 DIAGNOSIS — K920 Hematemesis: Secondary | ICD-10-CM

## 2022-05-19 DIAGNOSIS — R112 Nausea with vomiting, unspecified: Secondary | ICD-10-CM

## 2022-05-19 DIAGNOSIS — C220 Liver cell carcinoma: Secondary | ICD-10-CM

## 2022-05-19 DIAGNOSIS — M792 Neuralgia and neuritis, unspecified: Secondary | ICD-10-CM

## 2022-05-19 HISTORY — PX: IR PATIENT EVAL TECH 0-60 MINS: IMG5564

## 2022-05-19 MED ORDER — GABAPENTIN 300 MG PO CAPS: 300.0000 mg | ORAL_CAPSULE | Freq: Every day | ORAL | 1 refills | Status: DC

## 2022-05-19 MED ORDER — PROMETHAZINE HCL 12.5 MG PO TABS: 12.5000 mg | ORAL_TABLET | Freq: Four times a day (QID) | ORAL | 0 refills | Status: DC | PRN

## 2022-05-19 NOTE — Progress Notes (Signed)
The patient presented today to the [facility] Interventional Radiology department with complaints of persistent pain related to the left pleurx catheter which was placed in IR 03/09/22 by Dr. Kathlene Cote.  The patient states ever since the catheter was placed she has had persistent pain mostly in the left shoulder/scapular area that she says waxes and wanes throughout the day. She has been draining 1-2 times daily. She states that when the fluid is about out (while she is draining) she feels a very sharp pain in her left shoulder area. She is taking narcotic pain medication but would like to decrease her use as it has made her constipated and with a decreased appetite.    The site was assessed today and is unremarkable. There is very minimal erythema at the skin insertion site and no drainage observed. The catheter is functioning well and the patient has not had any issues with draining the fluid.   Dr. Kathlene Cote spoke with the patient and discussed that it's possible her pain is nerve related. He has encouraged her to try to drain only once daily and to go every other day if possible. When she is using the drain he counseled her to stop draining as soon as she begins to experience any discomfort. Imaging shows the catheter to be in an ideal position and barring infection or catheter malfunction IR would not want to remove or exchange the drain.   The patient will be started on gabapentin per Oncology. Dr. Kathlene Cote is supportive of this plan and optimistic it will have a positive impact on the catheter-related pain. The patient will see Dr. Kipp Brood with cardiothoracic surgery soon to discuss further treatment options.   The patient knows she can call IR with any further questions or concerns.  Theresa Rocha, [place] [phone] 05/19/2022, 3:23 PM

## 2022-05-19 NOTE — Procedures (Signed)
Assisted NP [facility] with attention to tunneled left pleural drain. Pt being seen for c/o pain since placement. Dressing was removed for evaluation and new dressing was applied.

## 2022-05-19 NOTE — Progress Notes (Signed)
This nurse reached out to patient with MD recommendations which is to start taking Gabapentin 300 mg every night at bedtime to help with pain and insomnia. Advised patient to give [place] a call if she notices any adverse reactions while taking this medication.  Left message on voicemail.  No further concerns at this time.

## 2022-05-21 ENCOUNTER — Encounter: Payer: Self-pay | Admitting: Hematology

## 2022-05-24 ENCOUNTER — Other Ambulatory Visit: Payer: Self-pay

## 2022-05-24 DIAGNOSIS — C220 Liver cell carcinoma: Secondary | ICD-10-CM | POA: Diagnosis not present

## 2022-05-24 DIAGNOSIS — R180 Malignant ascites: Secondary | ICD-10-CM | POA: Diagnosis not present

## 2022-05-24 DIAGNOSIS — C50919 Malignant neoplasm of unspecified site of unspecified female breast: Secondary | ICD-10-CM | POA: Diagnosis not present

## 2022-05-24 MED ORDER — OXYCODONE HCL 5 MG PO TABS: 5.0000 mg | ORAL_TABLET | ORAL | 0 refills | Status: DC | PRN

## 2022-05-25 DIAGNOSIS — J948 Other specified pleural conditions: Secondary | ICD-10-CM | POA: Diagnosis not present

## 2022-05-25 DIAGNOSIS — K7469 Other cirrhosis of liver: Secondary | ICD-10-CM | POA: Diagnosis not present

## 2022-05-25 DIAGNOSIS — I8511 Secondary esophageal varices with bleeding: Secondary | ICD-10-CM | POA: Diagnosis not present

## 2022-05-25 DIAGNOSIS — C220 Liver cell carcinoma: Secondary | ICD-10-CM | POA: Diagnosis not present

## 2022-05-26 DIAGNOSIS — C220 Liver cell carcinoma: Secondary | ICD-10-CM | POA: Diagnosis not present

## 2022-05-26 DIAGNOSIS — R180 Malignant ascites: Secondary | ICD-10-CM | POA: Diagnosis not present

## 2022-05-26 DIAGNOSIS — C50919 Malignant neoplasm of unspecified site of unspecified female breast: Secondary | ICD-10-CM | POA: Diagnosis not present

## 2022-05-27 DIAGNOSIS — Z48813 Encounter for surgical aftercare following surgery on the respiratory system: Secondary | ICD-10-CM | POA: Diagnosis not present

## 2022-05-27 DIAGNOSIS — J90 Pleural effusion, not elsewhere classified: Secondary | ICD-10-CM | POA: Diagnosis not present

## 2022-05-27 DIAGNOSIS — Z87891 Personal history of nicotine dependence: Secondary | ICD-10-CM | POA: Diagnosis not present

## 2022-05-27 DIAGNOSIS — N179 Acute kidney failure, unspecified: Secondary | ICD-10-CM | POA: Diagnosis not present

## 2022-05-27 DIAGNOSIS — E871 Hypo-osmolality and hyponatremia: Secondary | ICD-10-CM | POA: Diagnosis not present

## 2022-05-27 DIAGNOSIS — Z4801 Encounter for change or removal of surgical wound dressing: Secondary | ICD-10-CM | POA: Diagnosis not present

## 2022-05-27 DIAGNOSIS — K7460 Unspecified cirrhosis of liver: Secondary | ICD-10-CM | POA: Diagnosis not present

## 2022-05-27 DIAGNOSIS — C220 Liver cell carcinoma: Secondary | ICD-10-CM | POA: Diagnosis not present

## 2022-05-27 DIAGNOSIS — D630 Anemia in neoplastic disease: Secondary | ICD-10-CM | POA: Diagnosis not present

## 2022-05-27 DIAGNOSIS — E43 Unspecified severe protein-calorie malnutrition: Secondary | ICD-10-CM | POA: Diagnosis not present

## 2022-05-27 DIAGNOSIS — J449 Chronic obstructive pulmonary disease, unspecified: Secondary | ICD-10-CM | POA: Diagnosis not present

## 2022-05-27 DIAGNOSIS — M542 Cervicalgia: Secondary | ICD-10-CM | POA: Diagnosis not present

## 2022-05-27 DIAGNOSIS — B182 Chronic viral hepatitis C: Secondary | ICD-10-CM | POA: Diagnosis not present

## 2022-05-27 DIAGNOSIS — I7300 Raynaud's syndrome without gangrene: Secondary | ICD-10-CM | POA: Diagnosis not present

## 2022-05-27 DIAGNOSIS — J9621 Acute and chronic respiratory failure with hypoxia: Secondary | ICD-10-CM | POA: Diagnosis not present

## 2022-05-27 DIAGNOSIS — Z853 Personal history of malignant neoplasm of breast: Secondary | ICD-10-CM | POA: Diagnosis not present

## 2022-05-27 NOTE — Progress Notes (Signed)
      [address]       [place] [address]             [phone]        Onita M Handlin  Medical Record #[id number] Date of Birth: 02-Jun-1966  Referring: Truitt Merle, MD Primary Care: Seward Carol, MD Primary Cardiologist:None  Reason for visit:   follow-up  History of Present Illness:     33 female presents for hospital follow-up.  She had a Pleurx catheter placed by interventional radiology for liver cancer after an ablation.  She continues to drain twice a day with fluids ranging between 125-976m.  The 900 is in the range of training once a day.  Over the last few days she does endorse some exertional dyspnea.  Physical Exam: BP 97/63   Pulse 96   Resp 20   Ht '5\' 3"'$  (1.6 m)   Wt 85 lb (38.6 kg)   LMP 04/20/2013   SpO2 96% Comment: RA  BMI 15.06 kg/m   Alert NAD  Abdomen, ND No peripheral edema   Diagnostic Studies & Laboratory data: CXR: Small to moderate size left effusion.     Assessment / Plan:   56year old female with history of liver cancer that was ablated.  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  She continues to have significant output thus I will recommend that she continue drainage twice a day.  I will follow-up with her virtually in a few weeks.  HLajuana Matte6/30/2023 12:30 PM

## 2022-05-28 ENCOUNTER — Other Ambulatory Visit: Payer: Self-pay | Admitting: Thoracic Surgery (Cardiothoracic Vascular Surgery)

## 2022-05-28 ENCOUNTER — Ambulatory Visit (INDEPENDENT_AMBULATORY_CARE_PROVIDER_SITE_OTHER): Payer: BC Managed Care – PPO | Admitting: Thoracic Surgery (Cardiothoracic Vascular Surgery)

## 2022-05-28 ENCOUNTER — Ambulatory Visit
Admission: RE | Admit: 2022-05-28 | Discharge: 2022-05-28 | Disposition: A | Discharge status: Home / Self Care | Payer: BC Managed Care – PPO | Source: Ambulatory Visit | Attending: Thoracic Surgery (Cardiothoracic Vascular Surgery) | Admitting: Thoracic Surgery (Cardiothoracic Vascular Surgery)

## 2022-05-28 VITALS — BP 97/63 | HR 96 | Resp 20 | Ht 63.0 in | Wt 85.0 lb

## 2022-05-28 DIAGNOSIS — J90 Pleural effusion, not elsewhere classified: Secondary | ICD-10-CM

## 2022-05-31 ENCOUNTER — Encounter (HOSPITAL_BASED_OUTPATIENT_CLINIC_OR_DEPARTMENT_OTHER): Payer: Self-pay | Admitting: Emergency Medicine

## 2022-05-31 ENCOUNTER — Emergency Department (HOSPITAL_BASED_OUTPATIENT_CLINIC_OR_DEPARTMENT_OTHER): Payer: BC Managed Care – PPO | Admitting: Radiology

## 2022-05-31 ENCOUNTER — Observation Stay (HOSPITAL_BASED_OUTPATIENT_CLINIC_OR_DEPARTMENT_OTHER)
Admission: EM | Admit: 2022-05-31 | Discharge: 2022-06-02 | Disposition: A | Discharge status: Home / Self Care | Payer: BC Managed Care – PPO | Source: Ambulatory Visit | Attending: Emergency Medicine | Admitting: Emergency Medicine

## 2022-05-31 ENCOUNTER — Emergency Department (HOSPITAL_BASED_OUTPATIENT_CLINIC_OR_DEPARTMENT_OTHER): Payer: BC Managed Care – PPO

## 2022-05-31 ENCOUNTER — Other Ambulatory Visit: Payer: Self-pay

## 2022-05-31 DIAGNOSIS — E43 Unspecified severe protein-calorie malnutrition: Secondary | ICD-10-CM | POA: Diagnosis not present

## 2022-05-31 DIAGNOSIS — D649 Anemia, unspecified: Secondary | ICD-10-CM | POA: Diagnosis not present

## 2022-05-31 DIAGNOSIS — Z79899 Other long term (current) drug therapy: Secondary | ICD-10-CM | POA: Insufficient documentation

## 2022-05-31 DIAGNOSIS — E872 Acidosis, unspecified: Secondary | ICD-10-CM | POA: Diagnosis present

## 2022-05-31 DIAGNOSIS — R079 Chest pain, unspecified: Secondary | ICD-10-CM | POA: Diagnosis not present

## 2022-05-31 DIAGNOSIS — R0602 Shortness of breath: Secondary | ICD-10-CM | POA: Diagnosis not present

## 2022-05-31 DIAGNOSIS — C50111 Malignant neoplasm of central portion of right female breast: Secondary | ICD-10-CM | POA: Diagnosis not present

## 2022-05-31 DIAGNOSIS — C220 Liver cell carcinoma: Secondary | ICD-10-CM | POA: Insufficient documentation

## 2022-05-31 DIAGNOSIS — I8510 Secondary esophageal varices without bleeding: Secondary | ICD-10-CM | POA: Diagnosis present

## 2022-05-31 DIAGNOSIS — J90 Pleural effusion, not elsewhere classified: Secondary | ICD-10-CM | POA: Insufficient documentation

## 2022-05-31 DIAGNOSIS — Z170 Estrogen receptor positive status [ER+]: Secondary | ICD-10-CM

## 2022-05-31 DIAGNOSIS — D630 Anemia in neoplastic disease: Secondary | ICD-10-CM | POA: Diagnosis present

## 2022-05-31 DIAGNOSIS — Z20822 Contact with and (suspected) exposure to covid-19: Secondary | ICD-10-CM | POA: Diagnosis present

## 2022-05-31 DIAGNOSIS — F17210 Nicotine dependence, cigarettes, uncomplicated: Secondary | ICD-10-CM | POA: Insufficient documentation

## 2022-05-31 DIAGNOSIS — Z853 Personal history of malignant neoplasm of breast: Secondary | ICD-10-CM

## 2022-05-31 DIAGNOSIS — E875 Hyperkalemia: Secondary | ICD-10-CM | POA: Diagnosis not present

## 2022-05-31 DIAGNOSIS — J449 Chronic obstructive pulmonary disease, unspecified: Secondary | ICD-10-CM | POA: Diagnosis not present

## 2022-05-31 DIAGNOSIS — Z923 Personal history of irradiation: Secondary | ICD-10-CM

## 2022-05-31 DIAGNOSIS — I959 Hypotension, unspecified: Secondary | ICD-10-CM | POA: Diagnosis present

## 2022-05-31 DIAGNOSIS — Z800 Family history of malignant neoplasm of digestive organs: Secondary | ICD-10-CM

## 2022-05-31 DIAGNOSIS — Z79810 Long term (current) use of selective estrogen receptor modulators (SERMs): Secondary | ICD-10-CM

## 2022-05-31 DIAGNOSIS — Z886 Allergy status to analgesic agent status: Secondary | ICD-10-CM

## 2022-05-31 DIAGNOSIS — E871 Hypo-osmolality and hyponatremia: Secondary | ICD-10-CM | POA: Diagnosis not present

## 2022-05-31 DIAGNOSIS — I7300 Raynaud's syndrome without gangrene: Secondary | ICD-10-CM | POA: Diagnosis present

## 2022-05-31 DIAGNOSIS — B182 Chronic viral hepatitis C: Secondary | ICD-10-CM | POA: Diagnosis present

## 2022-05-31 DIAGNOSIS — Z888 Allergy status to other drugs, medicaments and biological substances status: Secondary | ICD-10-CM

## 2022-05-31 DIAGNOSIS — K766 Portal hypertension: Secondary | ICD-10-CM | POA: Diagnosis present

## 2022-05-31 DIAGNOSIS — D689 Coagulation defect, unspecified: Secondary | ICD-10-CM | POA: Diagnosis present

## 2022-05-31 DIAGNOSIS — K7460 Unspecified cirrhosis of liver: Secondary | ICD-10-CM

## 2022-05-31 DIAGNOSIS — Z801 Family history of malignant neoplasm of trachea, bronchus and lung: Secondary | ICD-10-CM

## 2022-05-31 DIAGNOSIS — E8809 Other disorders of plasma-protein metabolism, not elsewhere classified: Secondary | ICD-10-CM | POA: Diagnosis present

## 2022-05-31 DIAGNOSIS — D509 Iron deficiency anemia, unspecified: Secondary | ICD-10-CM | POA: Diagnosis present

## 2022-05-31 LAB — CBC WITH DIFFERENTIAL/PLATELET
Abs Immature Granulocytes: 0.11 10*3/uL — ABNORMAL HIGH (ref 0.00–0.07)
Basophils Absolute: 0.1 10*3/uL (ref 0.0–0.1)
Basophils Relative: 1 %
Eosinophils Absolute: 0.1 10*3/uL (ref 0.0–0.5)
Eosinophils Relative: 1 %
HCT: 30.7 % — ABNORMAL LOW (ref 36.0–46.0)
Hemoglobin: 9.8 g/dL — ABNORMAL LOW (ref 12.0–15.0)
Immature Granulocytes: 1 %
Lymphocytes Relative: 5 %
Lymphs Abs: 0.6 10*3/uL — ABNORMAL LOW (ref 0.7–4.0)
MCH: 26.1 pg (ref 26.0–34.0)
MCHC: 31.9 g/dL (ref 30.0–36.0)
MCV: 81.9 fL (ref 80.0–100.0)
Monocytes Absolute: 1.5 10*3/uL — ABNORMAL HIGH (ref 0.1–1.0)
Monocytes Relative: 14 %
Neutro Abs: 9 10*3/uL — ABNORMAL HIGH (ref 1.7–7.7)
Neutrophils Relative %: 78 %
Platelets: 128 10*3/uL — ABNORMAL LOW (ref 150–400)
RBC: 3.75 MIL/uL — ABNORMAL LOW (ref 3.87–5.11)
RDW: 22.8 % — ABNORMAL HIGH (ref 11.5–15.5)
WBC: 11.3 10*3/uL — ABNORMAL HIGH (ref 4.0–10.5)
nRBC: 0 % (ref 0.0–0.2)

## 2022-05-31 LAB — COMPREHENSIVE METABOLIC PANEL
ALT: 25 U/L (ref 0–44)
AST: 41 U/L (ref 15–41)
Albumin: 2.9 g/dL — ABNORMAL LOW (ref 3.5–5.0)
Alkaline Phosphatase: 81 U/L (ref 38–126)
Anion gap: 5 (ref 5–15)
BUN: 23 mg/dL — ABNORMAL HIGH (ref 6–20)
CO2: 20 mmol/L — ABNORMAL LOW (ref 22–32)
Calcium: 8.3 mg/dL — ABNORMAL LOW (ref 8.9–10.3)
Chloride: 97 mmol/L — ABNORMAL LOW (ref 98–111)
Creatinine, Ser: 0.62 mg/dL (ref 0.44–1.00)
GFR, Estimated: >60 mL/min (ref >60)
Glucose, Bld: 124 mg/dL — ABNORMAL HIGH (ref 70–99)
Potassium: 5.4 mmol/L — ABNORMAL HIGH (ref 3.5–5.1)
Sodium: 122 mmol/L — ABNORMAL LOW (ref 135–145)
Total Bilirubin: 1.1 mg/dL (ref 0.3–1.2)
Total Protein: 6.1 g/dL — ABNORMAL LOW (ref 6.5–8.1)

## 2022-05-31 LAB — TROPONIN I (HIGH SENSITIVITY): Troponin I (High Sensitivity): 6 ng/L (ref <18)

## 2022-05-31 MED ORDER — MIDODRINE HCL 5 MG PO TABS
15.0000 mg | ORAL_TABLET | Freq: Three times a day (TID) | ORAL | Status: DC
  Administered 2022-06-01 – 2022-06-02 (×5): 15 mg via ORAL
  Filled 2022-05-31 (×6): qty 3

## 2022-05-31 MED ORDER — OXYCODONE HCL 5 MG PO TABS
5.0000 mg | ORAL_TABLET | ORAL | Status: DC | PRN
  Administered 2022-05-31 – 2022-06-01 (×3): 5 mg via ORAL
  Filled 2022-05-31 (×3): qty 1

## 2022-05-31 MED ORDER — ALBUTEROL SULFATE HFA 108 (90 BASE) MCG/ACT IN AERS: 2.0000 | INHALATION_SPRAY | RESPIRATORY_TRACT | Status: DC | PRN

## 2022-05-31 MED ORDER — NICOTINE 21 MG/24HR TD PT24
21.0000 mg | MEDICATED_PATCH | Freq: Every day | TRANSDERMAL | Status: DC | PRN
  Administered 2022-06-01: 21 mg via TRANSDERMAL
  Filled 2022-05-31: qty 1

## 2022-05-31 MED ORDER — GABAPENTIN 300 MG PO CAPS
300.0000 mg | ORAL_CAPSULE | Freq: Every day | ORAL | Status: DC
  Administered 2022-05-31 – 2022-06-01 (×2): 300 mg via ORAL
  Filled 2022-05-31 (×2): qty 1

## 2022-05-31 MED ORDER — PROMETHAZINE HCL 25 MG PO TABS
12.5000 mg | ORAL_TABLET | Freq: Four times a day (QID) | ORAL | Status: DC | PRN
  Administered 2022-05-31 – 2022-06-02 (×4): 12.5 mg via ORAL
  Filled 2022-05-31 (×4): qty 1

## 2022-05-31 MED ORDER — SODIUM CHLORIDE 0.9 % IV SOLN: INTRAVENOUS | Status: DC

## 2022-05-31 MED ORDER — PANTOPRAZOLE SODIUM 40 MG PO TBEC
40.0000 mg | DELAYED_RELEASE_TABLET | Freq: Every day | ORAL | Status: DC
  Administered 2022-05-31 – 2022-06-02 (×3): 40 mg via ORAL
  Filled 2022-05-31 (×3): qty 1

## 2022-05-31 MED ORDER — IOHEXOL 350 MG/ML SOLN
100.0000 mL | Freq: Once | INTRAVENOUS | Status: AC | PRN
  Administered 2022-05-31: 60 mL via INTRAVENOUS

## 2022-05-31 NOTE — ED Notes (Signed)
Patient transported to CT 

## 2022-05-31 NOTE — ED Notes (Signed)
RT at triage for assessment

## 2022-05-31 NOTE — ED Provider Notes (Signed)
[facility] EMERGENCY DEPT Provider Note   CSN: [id number] Arrival date & time: 05/31/22  1641     History  Chief Complaint  Patient presents with   Shortness of Breath    Theresa Rocha is a 56 y.o. female.  Presented to the ER due to concern for shortness of breath.  She reports that symptoms have been going on for the past couple days, seem to be getting progressively worse.  Breathing gets worse with minimal exertion, also has some associated chest tightness.  At rest now has no complaints, does not have ongoing shortness of breath right now.  She states that she has not had any changes in her diet.  She has been emptying her Pleurx catheter twice daily, removing around 1 L daily of fluid.  No issues with the catheter, seems to be functioning fine, has not noted any fever or redness. HPI     Home Medications Prior to Admission medications   Medication Sig Start Date End Date Taking? Authorizing Provider  baclofen (LIORESAL) 10 MG tablet Take 5 mg by mouth daily as needed for muscle spasms. 03/12/22   [provider]  dicyclomine (BENTYL) 20 MG tablet Take 0.5 tablets (10 mg total) by mouth every 6 (six) hours as needed for spasms. Take one tablet every 6-8 hours as needed for abdominal cramping Patient taking differently: Take 20 mg by mouth daily as needed (abdominal cramping). 01/07/22   Noralyn Pick, NP  gabapentin (NEURONTIN) 300 MG capsule Take 1 capsule (300 mg total) by mouth at bedtime. 05/19/22   Truitt Merle, MD  lactulose (CHRONULAC) 10 GM/15ML solution Take 15 mLs (10 g total) by mouth 2 (two) times daily as needed for mild constipation. 05/03/22 08/01/22  Terrilee Croak, MD  midodrine (PROAMATINE) 5 MG tablet Take 3 tablets (15 mg total) by mouth 3 (three) times daily with meals. 05/03/22 08/01/22  Dahal, Marlowe Aschoff, MD  nicotine (NICODERM CQ - DOSED IN MG/24 HOURS) 21 mg/24hr patch Place 21 mg onto the skin daily as needed (smoking cessation on work days).     [provider]  oxyCODONE (OXY IR/ROXICODONE) 5 MG immediate release tablet Take 1-2 tablets (5-10 mg total) by mouth every 4 (four) hours as needed for severe pain. 05/24/22   Truitt Merle, MD  pantoprazole (PROTONIX) 40 MG tablet Take 1 tablet (40 mg total) by mouth daily. 12/01/21   Noralyn Pick, NP  promethazine (PHENERGAN) 12.5 MG tablet Take 1 tablet (12.5 mg total) by mouth every 6 (six) hours as needed for nausea or vomiting. 05/19/22   Truitt Merle, MD  tamoxifen (NOLVADEX) 20 MG tablet TAKE 1 TABLET BY MOUTH EVERY DAY Patient taking differently: Take 20 mg by mouth every evening. 10/13/20   Truitt Merle, MD      Allergies    Hydromorphone hcl and Aspirin    Review of Systems   Review of Systems  Constitutional:  Negative for chills and fever.  HENT:  Negative for ear pain and sore throat.   Eyes:  Negative for pain and visual disturbance.  Respiratory:  Positive for chest tightness and shortness of breath. Negative for cough.   Cardiovascular:  Positive for chest pain. Negative for palpitations.  Gastrointestinal:  Negative for abdominal pain and vomiting.  Genitourinary:  Negative for dysuria and hematuria.  Musculoskeletal:  Negative for arthralgias and back pain.  Skin:  Negative for color change and rash.  Neurological:  Negative for seizures and syncope.  All other systems reviewed  and are negative.   Physical Exam Updated Vital Signs BP 97/62   Pulse 95   Temp 98.4 F (36.9 C)   Resp 16   Ht '5\' 3"'$  (1.6 m)   Wt 38.1 kg   LMP 04/20/2013   SpO2 99%   BMI 14.88 kg/m  Physical Exam Vitals and nursing note reviewed.  Constitutional:      General: She is not in acute distress.    Appearance: She is well-developed.  HENT:     Head: Normocephalic and atraumatic.  Eyes:     Conjunctiva/sclera: Conjunctivae normal.  Cardiovascular:     Rate and Rhythm: Normal rate and regular rhythm.     Heart sounds: No murmur heard. Pulmonary:     Effort:  Pulmonary effort is normal. No respiratory distress.     Breath sounds: Normal breath sounds.  Chest:     Comments: Left-sided Pleurx catheter in place Abdominal:     Palpations: Abdomen is soft.     Tenderness: There is no abdominal tenderness.  Musculoskeletal:        General: No swelling.     Cervical back: Neck supple.  Skin:    General: Skin is warm and dry.     Capillary Refill: Capillary refill takes less than 2 seconds.  Neurological:     Mental Status: She is alert.  Psychiatric:        Mood and Affect: Mood normal.     ED Results / Procedures / Treatments   Labs (all labs ordered are listed, but only abnormal results are displayed) Labs Reviewed  CBC WITH DIFFERENTIAL/PLATELET - Abnormal; Notable for the following components:      Result Value   WBC 11.3 (*)    RBC 3.75 (*)    Hemoglobin 9.8 (*)    HCT 30.7 (*)    RDW 22.8 (*)    Platelets 128 (*)    Neutro Abs 9.0 (*)    Lymphs Abs 0.6 (*)    Monocytes Absolute 1.5 (*)    Abs Immature Granulocytes 0.11 (*)    All other components within normal limits  COMPREHENSIVE METABOLIC PANEL - Abnormal; Notable for the following components:   Sodium 122 (*)    Potassium 5.4 (*)    Chloride 97 (*)    CO2 20 (*)    Glucose, Bld 124 (*)    BUN 23 (*)    Calcium 8.3 (*)    Total Protein 6.1 (*)    Albumin 2.9 (*)    All other components within normal limits  URINALYSIS, ROUTINE W REFLEX MICROSCOPIC  SODIUM, URINE, RANDOM  CREATININE, URINE, RANDOM  BASIC METABOLIC PANEL  BASIC METABOLIC PANEL  BASIC METABOLIC PANEL  BASIC METABOLIC PANEL  TROPONIN I (HIGH SENSITIVITY)    EKG EKG Interpretation  Date/Time:  Monday May 31 2022 16:51:27 EDT Ventricular Rate:  96 PR Interval:  110 QRS Duration: 72 QT Interval:  342 QTC Calculation: 432 R Axis:   86 Text Interpretation: Sinus rhythm with short PR Right atrial enlargement Borderline ECG When compared with ECG of 15-Apr-2022 02:15, Nonspecific T wave  abnormality no longer evident in Inferior leads Nonspecific T wave abnormality, improved in Anterior leads Confirmed by Madalyn Rob [phone]) on 05/31/2022 9:02:07 PM  Radiology CT Angio Chest PE W and/or Wo Contrast  Result Date: 05/31/2022 CLINICAL DATA:  Chest pain and shortness of breath, history of hepatocellular carcinoma EXAM: CT ANGIOGRAPHY CHEST WITH CONTRAST TECHNIQUE: Multidetector CT imaging of the chest was performed  using the standard protocol during bolus administration of intravenous contrast. Multiplanar CT image reconstructions and MIPs were obtained to evaluate the vascular anatomy. RADIATION DOSE REDUCTION: This exam was performed according to the departmental dose-optimization program which includes automated exposure control, adjustment of the mA and/or kV according to patient size and/or use of iterative reconstruction technique. CONTRAST:  42m OMNIPAQUE IOHEXOL 350 MG/ML SOLN COMPARISON:  Chest CT dated February 21, 2022 FINDINGS: Cardiovascular: No evidence of pulmonary embolus. Normal heart size. No pericardial effusion. Normal caliber thoracic aorta with mild atherosclerotic disease. Mediastinum/Nodes: Thyroid is unremarkable. Paraesophageal varices. No pathologically enlarged lymph nodes seen in the chest. Lungs/Pleura: Central airways are patent. Moderate centrilobular emphysema. Solid pulmonary nodule of the right upper lobe measuring 4 mm on series 6, image 59, previously measured 2 mm. Moderate left pleural effusion with PleurX catheter in place. Upper Abdomen: Cirrhotic liver morphology. Lesion of the left hepatic lobe measuring 2.8 x 1.6 cm and irregular lesion of the posterior right hepatic lobe measuring approximally 4.1 x 3.2 cm, similar in size when compared with prior CT, evaluation is limited due to lack of IV contrast. Abdominal ascites and upper abdominal varices. Musculoskeletal: No chest wall abnormality. No acute or significant osseous findings. Review of the MIP  images confirms the above findings. IMPRESSION: 1. No evidence of pulmonary embolus. 2. Moderate left pleural effusion with PleurX catheter in place. 3. Cirrhotic liver morphology with sequela of portal hypertension. Lesions of the left and right hepatic lobes, compatible with known primary malignancy. 4. Solid 4 mm pulmonary nodule of the right upper lobe is slightly increased in size when compared with most recent prior chest CT. Recommend attention on follow-up per clinical protocol. Electronically Signed   By: LYetta GlassmanM.D.   On: 05/31/2022 19:44   DG Chest 2 View  Result Date: 05/31/2022 CLINICAL DATA:  Shortness of breath. EXAM: CHEST - 2 VIEW COMPARISON:  Chest x-rays dated 05/28/2022 and 04/22/2022. Chest CT dated 02/21/2022 FINDINGS: LEFT-sided chest tube is stable in position with tip directed towards the LEFT lung apex. Stable small residual LEFT pleural effusion. RIGHT lung is clear. No pneumothorax is seen. Heart size and mediastinal contours are within normal limits, stable osseous structures about the chest are unremarkable. IMPRESSION: Stable chest x-ray. LEFT-sided chest tube is stable in position. Small residual LEFT pleural effusion is stable. No pneumothorax seen. Electronically Signed   By: SFranki CabotM.D.   On: 05/31/2022 17:44    Procedures .Critical Care  Performed by: DLucrezia Starch MD Authorized by: DLucrezia Starch MD   Critical care provider statement:    Critical care time (minutes):  38   Critical care was necessary to treat or prevent imminent or life-threatening deterioration of the following conditions:  Metabolic crisis   Critical care was time spent personally by me on the following activities:  Development of treatment plan with patient or surrogate, discussions with consultants, evaluation of patient's response to treatment, examination of patient, ordering and review of laboratory studies, ordering and review of radiographic studies, ordering and  performing treatments and interventions, pulse oximetry, re-evaluation of patient's condition and review of old charts     Medications Ordered in ED Medications  albuterol (VENTOLIN HFA) 108 (90 Base) MCG/ACT inhaler 2 puff (has no administration in time range)  0.9 %  sodium chloride infusion ( Intravenous New Bag/Given 05/31/22 2045)  iohexol (OMNIPAQUE) 350 MG/ML injection 100 mL (60 mLs Intravenous Contrast Given 05/31/22 1902)    ED Course/ Medical  Decision Making/ A&P                           Medical Decision Making Amount and/or Complexity of Data Reviewed Labs: ordered. Radiology: ordered.  Risk Prescription drug management. Decision regarding hospitalization.   56 year old lady with history of breast cancer, hepatocellular carcinoma, recurrent left pleural effusion s/p Pleurx catheter placement presenting to the emergency department due to concern for shortness of breath.  On physical exam she appears well in no distress, vital signs grossly stable, BP soft the patient reports this is normal for her.  Basic labs reviewed, noted hyponatremia.  Regarding the shortness of breath checked PE study, no PE, Pleurx catheter in place, pleural effusion present.  No pneumonia.  Regarding the hyponatremia, recent admission for similar. Hyponatremia felt related to hypovolemia, portal hypertension, liver cirrhosis. Started on NS infusion at 68m/hr for now. Consulted [facility] for admit. Discussed with [facility]. He will accept. He agreed with initial NS orders, advised monitoring q4 bmps to ensure no rapid correction.   Reviewed dc sum on 6/05 - PMH of breast cancer, hepatocellular cancer, chronic hepatitis C, nonalcoholic liver cirrhosis, portal hypertension, esophageal varices, lactose intolerance, IBS, chronic anemia, thrombocytopenia, COPD        Final Clinical Impression(s) / ED Diagnoses Final diagnoses:  Hyponatremia  Pleural effusion on left    Rx / DC Orders ED Discharge Orders      None         DLucrezia Starch MD 05/31/22 2106

## 2022-05-31 NOTE — ED Notes (Signed)
Pt unable to void at this time. 

## 2022-05-31 NOTE — ED Notes (Signed)
Patient transported to X-ray 

## 2022-05-31 NOTE — ED Triage Notes (Signed)
Pt from PCP related to possible PE. Pt. c/o  of SOB with exertion, chest tightness and dizziness  (Dizziness when initially standing up and then goes away until standing up again)   . Symptoms started on Friday and have gotten progressively worse. Pt has a plurex catheter on her left side.  Pt states it is functioning properly and draining like it should.

## 2022-06-01 DIAGNOSIS — D630 Anemia in neoplastic disease: Secondary | ICD-10-CM | POA: Diagnosis present

## 2022-06-01 DIAGNOSIS — C220 Liver cell carcinoma: Secondary | ICD-10-CM | POA: Diagnosis present

## 2022-06-01 DIAGNOSIS — E875 Hyperkalemia: Secondary | ICD-10-CM | POA: Diagnosis present

## 2022-06-01 DIAGNOSIS — E871 Hypo-osmolality and hyponatremia: Secondary | ICD-10-CM | POA: Diagnosis not present

## 2022-06-01 DIAGNOSIS — D649 Anemia, unspecified: Secondary | ICD-10-CM | POA: Diagnosis not present

## 2022-06-01 DIAGNOSIS — Z923 Personal history of irradiation: Secondary | ICD-10-CM | POA: Diagnosis not present

## 2022-06-01 DIAGNOSIS — K766 Portal hypertension: Secondary | ICD-10-CM | POA: Diagnosis present

## 2022-06-01 DIAGNOSIS — D509 Iron deficiency anemia, unspecified: Secondary | ICD-10-CM | POA: Diagnosis present

## 2022-06-01 DIAGNOSIS — Z801 Family history of malignant neoplasm of trachea, bronchus and lung: Secondary | ICD-10-CM | POA: Diagnosis not present

## 2022-06-01 DIAGNOSIS — I8510 Secondary esophageal varices without bleeding: Secondary | ICD-10-CM | POA: Diagnosis present

## 2022-06-01 DIAGNOSIS — Z886 Allergy status to analgesic agent status: Secondary | ICD-10-CM | POA: Diagnosis not present

## 2022-06-01 DIAGNOSIS — D689 Coagulation defect, unspecified: Secondary | ICD-10-CM | POA: Diagnosis present

## 2022-06-01 DIAGNOSIS — J449 Chronic obstructive pulmonary disease, unspecified: Secondary | ICD-10-CM | POA: Diagnosis present

## 2022-06-01 DIAGNOSIS — E43 Unspecified severe protein-calorie malnutrition: Secondary | ICD-10-CM | POA: Diagnosis present

## 2022-06-01 DIAGNOSIS — Z170 Estrogen receptor positive status [ER+]: Secondary | ICD-10-CM | POA: Diagnosis not present

## 2022-06-01 DIAGNOSIS — I7300 Raynaud's syndrome without gangrene: Secondary | ICD-10-CM | POA: Diagnosis present

## 2022-06-01 DIAGNOSIS — J90 Pleural effusion, not elsewhere classified: Secondary | ICD-10-CM | POA: Diagnosis present

## 2022-06-01 DIAGNOSIS — E872 Acidosis, unspecified: Secondary | ICD-10-CM | POA: Diagnosis present

## 2022-06-01 DIAGNOSIS — K7460 Unspecified cirrhosis of liver: Secondary | ICD-10-CM | POA: Diagnosis present

## 2022-06-01 DIAGNOSIS — B182 Chronic viral hepatitis C: Secondary | ICD-10-CM | POA: Diagnosis present

## 2022-06-01 DIAGNOSIS — E8809 Other disorders of plasma-protein metabolism, not elsewhere classified: Secondary | ICD-10-CM | POA: Diagnosis present

## 2022-06-01 DIAGNOSIS — Z800 Family history of malignant neoplasm of digestive organs: Secondary | ICD-10-CM | POA: Diagnosis not present

## 2022-06-01 DIAGNOSIS — Z20822 Contact with and (suspected) exposure to covid-19: Secondary | ICD-10-CM | POA: Diagnosis present

## 2022-06-01 DIAGNOSIS — I959 Hypotension, unspecified: Secondary | ICD-10-CM | POA: Diagnosis present

## 2022-06-01 DIAGNOSIS — F17210 Nicotine dependence, cigarettes, uncomplicated: Secondary | ICD-10-CM | POA: Diagnosis present

## 2022-06-01 LAB — BASIC METABOLIC PANEL
Anion gap: 5 (ref 5–15)
Anion gap: 6 (ref 5–15)
Anion gap: 4 — ABNORMAL LOW (ref 5–15)
Anion gap: 4 — ABNORMAL LOW (ref 5–15)
Anion gap: 5 (ref 5–15)
BUN: 20 mg/dL (ref 6–20)
BUN: 19 mg/dL (ref 6–20)
BUN: 20 mg/dL (ref 6–20)
BUN: 19 mg/dL (ref 6–20)
BUN: 19 mg/dL (ref 6–20)
CO2: 19 mmol/L — ABNORMAL LOW (ref 22–32)
CO2: 19 mmol/L — ABNORMAL LOW (ref 22–32)
CO2: 19 mmol/L — ABNORMAL LOW (ref 22–32)
CO2: 16 mmol/L — ABNORMAL LOW (ref 22–32)
CO2: 15 mmol/L — ABNORMAL LOW (ref 22–32)
Calcium: 7.5 mg/dL — ABNORMAL LOW (ref 8.9–10.3)
Calcium: 7.5 mg/dL — ABNORMAL LOW (ref 8.9–10.3)
Calcium: 7.3 mg/dL — ABNORMAL LOW (ref 8.9–10.3)
Calcium: 7.4 mg/dL — ABNORMAL LOW (ref 8.9–10.3)
Calcium: 7.2 mg/dL — ABNORMAL LOW (ref 8.9–10.3)
Chloride: 102 mmol/L (ref 98–111)
Chloride: 101 mmol/L (ref 98–111)
Chloride: 103 mmol/L (ref 98–111)
Chloride: 105 mmol/L (ref 98–111)
Chloride: 106 mmol/L (ref 98–111)
Creatinine, Ser: 0.43 mg/dL — ABNORMAL LOW (ref 0.44–1.00)
Creatinine, Ser: 0.48 mg/dL (ref 0.44–1.00)
Creatinine, Ser: 0.47 mg/dL (ref 0.44–1.00)
Creatinine, Ser: 0.59 mg/dL (ref 0.44–1.00)
Creatinine, Ser: 0.66 mg/dL (ref 0.44–1.00)
GFR, Estimated: >60 mL/min (ref >60)
GFR, Estimated: >60 mL/min (ref >60)
GFR, Estimated: >60 mL/min (ref >60)
GFR, Estimated: >60 mL/min (ref >60)
GFR, Estimated: >60 mL/min (ref >60)
Glucose, Bld: 120 mg/dL — ABNORMAL HIGH (ref 70–99)
Glucose, Bld: 125 mg/dL — ABNORMAL HIGH (ref 70–99)
Glucose, Bld: 133 mg/dL — ABNORMAL HIGH (ref 70–99)
Glucose, Bld: 131 mg/dL — ABNORMAL HIGH (ref 70–99)
Glucose, Bld: 130 mg/dL — ABNORMAL HIGH (ref 70–99)
Potassium: 4.7 mmol/L (ref 3.5–5.1)
Potassium: 4.8 mmol/L (ref 3.5–5.1)
Potassium: 5.1 mmol/L (ref 3.5–5.1)
Potassium: 5.2 mmol/L — ABNORMAL HIGH (ref 3.5–5.1)
Potassium: 5.4 mmol/L — ABNORMAL HIGH (ref 3.5–5.1)
Sodium: 126 mmol/L — ABNORMAL LOW (ref 135–145)
Sodium: 126 mmol/L — ABNORMAL LOW (ref 135–145)
Sodium: 126 mmol/L — ABNORMAL LOW (ref 135–145)
Sodium: 125 mmol/L — ABNORMAL LOW (ref 135–145)
Sodium: 126 mmol/L — ABNORMAL LOW (ref 135–145)

## 2022-06-01 LAB — URINALYSIS, ROUTINE W REFLEX MICROSCOPIC
Bilirubin Urine: NEGATIVE
Glucose, UA: NEGATIVE mg/dL
Hgb urine dipstick: NEGATIVE
Ketones, ur: NEGATIVE mg/dL
Leukocytes,Ua: NEGATIVE
Nitrite: NEGATIVE
Protein, ur: NEGATIVE mg/dL
Specific Gravity, Urine: >1.046 — ABNORMAL HIGH (ref 1.005–1.030)
pH: 5 (ref 5.0–8.0)

## 2022-06-01 LAB — LACTIC ACID, PLASMA
Lactic Acid, Venous: 1.8 mmol/L (ref 0.5–1.9)
Lactic Acid, Venous: 1.7 mmol/L (ref 0.5–1.9)

## 2022-06-01 LAB — PROTIME-INR
INR: 1.5 — ABNORMAL HIGH (ref 0.8–1.2)
Prothrombin Time: 17.5 seconds — ABNORMAL HIGH (ref 11.4–15.2)

## 2022-06-01 LAB — OSMOLALITY, URINE: Osmolality, Ur: 814 mOsm/kg (ref 300–900)

## 2022-06-01 LAB — SODIUM, URINE, RANDOM: Sodium, Ur: <10 mmol/L

## 2022-06-01 LAB — CORTISOL-AM, BLOOD: Cortisol - AM: 10.7 ug/dL (ref 6.7–22.6)

## 2022-06-01 LAB — CREATININE, URINE, RANDOM: Creatinine, Urine: 119.23 mg/dL

## 2022-06-01 LAB — OSMOLALITY: Osmolality: 269 mOsm/kg — ABNORMAL LOW (ref 275–295)

## 2022-06-01 LAB — SODIUM: Sodium: 127 mmol/L — ABNORMAL LOW (ref 135–145)

## 2022-06-01 LAB — TSH: TSH: 4.032 u[IU]/mL (ref 0.350–4.500)

## 2022-06-01 MED ORDER — SODIUM CHLORIDE 1 G PO TABS
1.0000 g | ORAL_TABLET | Freq: Two times a day (BID) | ORAL | Status: DC
  Administered 2022-06-01 – 2022-06-02 (×2): 1 g via ORAL
  Filled 2022-06-01 (×2): qty 1

## 2022-06-01 MED ORDER — OXYCODONE HCL 5 MG PO TABS: 5.0000 mg | ORAL_TABLET | Freq: Four times a day (QID) | ORAL | Status: DC | PRN

## 2022-06-01 MED ORDER — HYDROMORPHONE HCL 1 MG/ML IJ SOLN: 0.5000 mg | INTRAMUSCULAR | Status: DC | PRN

## 2022-06-01 MED ORDER — PHYTONADIONE 5 MG PO TABS
5.0000 mg | ORAL_TABLET | Freq: Once | ORAL | Status: AC
  Administered 2022-06-01: 5 mg via ORAL
  Filled 2022-06-01: qty 1

## 2022-06-01 MED ORDER — MORPHINE SULFATE (PF) 2 MG/ML IV SOLN: 2.0000 mg | INTRAVENOUS | Status: DC | PRN

## 2022-06-01 MED ORDER — FLUDROCORTISONE ACETATE 0.1 MG PO TABS
0.1000 mg | ORAL_TABLET | Freq: Every day | ORAL | Status: DC
Start: 2022-06-02 — End: 2022-06-02
  Administered 2022-06-02: 0.1 mg via ORAL
  Filled 2022-06-01: qty 1

## 2022-06-01 MED ORDER — ALBUTEROL SULFATE (2.5 MG/3ML) 0.083% IN NEBU: 2.5000 mg | INHALATION_SOLUTION | RESPIRATORY_TRACT | Status: DC | PRN

## 2022-06-01 MED ORDER — FERROUS SULFATE 325 (65 FE) MG PO TABS
325.0000 mg | ORAL_TABLET | Freq: Every day | ORAL | Status: DC
  Administered 2022-06-02: 325 mg via ORAL
  Filled 2022-06-01: qty 1

## 2022-06-01 MED ORDER — POLYETHYLENE GLYCOL 3350 17 G PO PACK
17.0000 g | PACK | Freq: Every day | ORAL | Status: DC | PRN
  Administered 2022-06-02: 17 g via ORAL
  Filled 2022-06-01: qty 1

## 2022-06-01 MED ORDER — SODIUM CHLORIDE 1 G PO TABS: 1.0000 g | ORAL_TABLET | Freq: Two times a day (BID) | ORAL | Status: DC

## 2022-06-01 NOTE — Progress Notes (Signed)
Na=127, volume status stable, will D/C NS infusion and start NaCL tablet 1gm BID.

## 2022-06-01 NOTE — H&P (Signed)
History and Physical    ALYRICA THUROW UPJ:[id number] DOB: November 01, 1966 DOA: 05/31/2022  PCP: Seward Carol, MD (Confirm with patient/family/NH records and if not entered, this has to be entered at [facility] point of entry) Patient coming from: Home  I have personally briefly reviewed patient's old medical records in [place]  Chief Complaint: Feeling weak  HPI: Theresa Rocha is a 56 y.o. female with medical history significant of chronic hyponatremia, breast cancer on chemotherapy, chronic left-sided pleural effusion on Pleurx catheter, hepatocellular cancer, chronic hepatitis C, NASH, portal hypertension, esophageal varices, IBS, chronic anemia, COPD, presented with worsening of lightheadedness.  Symptoms started 4 days ago, progressively getting worse.  No fall.  Denies any numbness weakness of any of the limbs.  Patient reports recently increased output from Pleurx catheter more than 1 L a day.  Family also reports poor nutrition status, lost about 7 kg in 3 months.  Patient reported for appetite, and poor response to lactulose which she has to take for her cirrhosis and ammonia level.  ED Course: Blood pressure borderline low, sodium 122.  ET angiogram negative for PE but moderate left pleural effusion  Review of Systems: As per HPI otherwise 14 point review of systems negative.    Past Medical History:  Diagnosis Date   Anemia    Breast cancer ([place]) 08/30/2019   Cancer ([place])    liver- just diagnosed, not started treatment yet   Cervicalgia    COPD (chronic obstructive pulmonary disease) (HCC)    Esophageal varices (HCC)    GERD (gastroesophageal reflux disease)    Headache    Hepatic cirrhosis (HCC)    Hepatitis C    resolved 2018   Hepatocellular carcinoma (HCC)    IBS (irritable bowel syndrome)    Lactose intolerance    Personal history of radiation therapy 11/2019   Right breast   Portal hypertension ([place])    Raynaud's syndrome    Thrombocytopenia ([place])     Past  Surgical History:  Procedure Laterality Date   BREAST BIOPSY Right 09/2019   BREAST LUMPECTOMY Right 09/2019   BREAST LUMPECTOMY WITH AXILLARY LYMPH NODE BIOPSY Right 10/16/2019   Procedure: RIGHT BREAST LUMPECTOMY WITH SENTINEL LYMPH NODE BIOPSY;  Surgeon: Stark Klein, MD;  Location: [facility];  Service: General;  Laterality: Right;   COLONOSCOPY     ESOPHAGEAL BANDING  10/27/2021   Procedure: ESOPHAGEAL BANDING;  Surgeon: Irene Shipper, MD;  Location: [facility];  Service: Endoscopy;;   ESOPHAGOGASTRODUODENOSCOPY (EGD) WITH PROPOFOL N/A 10/27/2021   Procedure: ESOPHAGOGASTRODUODENOSCOPY (EGD) WITH PROPOFOL;  Surgeon: Irene Shipper, MD;  Location: [facility];  Service: Endoscopy;  Laterality: N/A;   ESOPHAGOGASTRODUODENOSCOPY (EGD) WITH PROPOFOL N/A 01/04/2022   Procedure: ESOPHAGOGASTRODUODENOSCOPY (EGD) WITH PROPOFOL;  Surgeon: Irene Shipper, MD;  Location: [facility];  Service: Endoscopy;  Laterality: N/A;   HAMMER TOE SURGERY Left 2012   pins placed in great toe, 2nd,3rd, 4th toes, all pins removed except great toe   IR ANGIOGRAM SELECTIVE EACH ADDITIONAL VESSEL  12/16/2021   IR ANGIOGRAM SELECTIVE EACH ADDITIONAL VESSEL  12/16/2021   IR ANGIOGRAM SELECTIVE EACH ADDITIONAL VESSEL  12/22/2021   IR ANGIOGRAM VISCERAL SELECTIVE  12/16/2021   IR ANGIOGRAM VISCERAL SELECTIVE  12/22/2021   IR EMBO TUMOR ORGAN ISCHEMIA INFARCT INC GUIDE ROADMAPPING  12/22/2021   IR PARACENTESIS  11/13/2019   IR PARACENTESIS  03/04/2022   IR PATIENT EVAL TECH 0-60 MINS  05/19/2022   IR PERC PLEURAL DRAIN W/INDWELL CATH  W/IMG GUIDE  03/09/2022   IR RADIOLOGIST EVAL & MGMT  08/16/2019   IR RADIOLOGIST EVAL & MGMT  02/06/2020   IR RADIOLOGIST EVAL & MGMT  06/26/2020   IR RADIOLOGIST EVAL & MGMT  12/31/2020   IR RADIOLOGIST EVAL & MGMT  07/21/2021   IR RADIOLOGIST EVAL & MGMT  10/08/2021   IR RADIOLOGIST EVAL & MGMT  01/13/2022   IR RADIOLOGIST EVAL & MGMT  03/05/2022   IR THORACENTESIS ASP PLEURAL SPACE W/IMG GUIDE   10/12/2021   IR THORACENTESIS ASP PLEURAL SPACE W/IMG GUIDE  10/26/2021   IR THORACENTESIS ASP PLEURAL SPACE W/IMG GUIDE  11/19/2021   IR THORACENTESIS ASP PLEURAL SPACE W/IMG GUIDE  12/22/2021   IR US GUIDE VASC ACCESS RIGHT  12/16/2021   IR US GUIDE VASC ACCESS RIGHT  12/22/2021   PELVIC LAPAROSCOPY Bilateral 1992   RADIOLOGY WITH ANESTHESIA N/A 09/16/2021   Procedure: CT MICROWAVE ABLATION;  Surgeon: Aletta Edouard, MD;  Location: [facility];  Service: Radiology;  Laterality: N/A;   UPPER GASTROINTESTINAL ENDOSCOPY  2002   had esophageal dilitation     reports that she has been smoking cigarettes. She started smoking about 43 years ago. She has a 15.00 pack-year smoking history. She has never used smokeless tobacco. She reports that she does not currently use alcohol after a past usage of about 6.0 standard drinks of alcohol per week. She reports that she does not use drugs.  Allergies  Allergen Reactions   Hydromorphone Hcl Nausea And Vomiting    Severe vomiting   Aspirin Nausea Only    Family History  Problem Relation Age of Onset   Liver cancer Mother    Lung cancer Mother    Lung cancer Father    Liver cancer Maternal Grandmother    Colon cancer Neg Hx    Esophageal cancer Neg Hx    Stomach cancer Neg Hx    Rectal cancer Neg Hx      Prior to Admission medications   Medication Sig Start Date End Date Taking? Authorizing Provider  dicyclomine (BENTYL) 20 MG tablet Take 0.5 tablets (10 mg total) by mouth every 6 (six) hours as needed for spasms. Take one tablet every 6-8 hours as needed for abdominal cramping Patient taking differently: Take 20 mg by mouth daily as needed (abdominal cramping). 01/07/22  Yes Noralyn Pick, NP  gabapentin (NEURONTIN) 300 MG capsule Take 1 capsule (300 mg total) by mouth at bedtime. 05/19/22  Yes Truitt Merle, MD  lactulose (CHRONULAC) 10 GM/15ML solution Take 15 mLs (10 g total) by mouth 2 (two) times daily as needed for mild constipation.  05/03/22 08/01/22 Yes Dahal, Marlowe Aschoff, MD  midodrine (PROAMATINE) 5 MG tablet Take 3 tablets (15 mg total) by mouth 3 (three) times daily with meals. 05/03/22 08/01/22 Yes Dahal, Marlowe Aschoff, MD  oxyCODONE (OXY IR/ROXICODONE) 5 MG immediate release tablet Take 1-2 tablets (5-10 mg total) by mouth every 4 (four) hours as needed for severe pain. Patient taking differently: Take 5 mg by mouth daily as needed (pain). 05/24/22  Yes Truitt Merle, MD  pantoprazole (PROTONIX) 40 MG tablet Take 1 tablet (40 mg total) by mouth daily. 12/01/21  Yes Noralyn Pick, NP  Prenatal Vit-Fe Fumarate-FA (PRENATAL PO) Take 1 tablet by mouth daily.   Yes [provider]  promethazine (PHENERGAN) 12.5 MG tablet Take 1 tablet (12.5 mg total) by mouth every 6 (six) hours as needed for nausea or vomiting. Patient taking differently: Take 12.5 mg by mouth  daily as needed for nausea or vomiting. 05/19/22  Yes Truitt Merle, MD  tamoxifen (NOLVADEX) 20 MG tablet TAKE 1 TABLET BY MOUTH EVERY DAY Patient taking differently: Take 20 mg by mouth every evening. 10/13/20  Yes Truitt Merle, MD  baclofen (LIORESAL) 10 MG tablet Take 5 mg by mouth daily as needed for muscle spasms. Patient not taking: Reported on 06/01/2022 03/12/22   [provider]  nicotine (NICODERM CQ - DOSED IN MG/24 HOURS) 21 mg/24hr patch Place 21 mg onto the skin daily as needed (smoking cessation on work days). Patient not taking: Reported on 06/01/2022    [provider]    Physical Exam: Vitals:   06/01/22 0130 06/01/22 0145 06/01/22 0229 06/01/22 0537  BP: (!) _0 (!) 98/59  Pulse: 84 79 88 90  Resp: _1 Temp:   98 F (36.7 C) 98.1 F (36.7 C)  TempSrc:   Oral Oral  SpO2: 97% 98% 98% 99%  Weight:   39.1 kg   Height:   _2  (1.6 m)     Constitutional: NAD, calm, comfortable Vitals:   06/01/22 0130 06/01/22 0145 06/01/22 0229 06/01/22 0537  BP: (!) _3 (!) 98/59  Pulse: 84 79 88 90  Resp: _4 Temp:   98 F (36.7 C) 98.1 F (36.7 C)  TempSrc:   Oral Oral  SpO2: 97% 98% 98% 99%  Weight:   39.1 kg   Height:   _5  (1.6 m)    Eyes: PERRL, lids and conjunctivae normal ENMT: Mucous membranes are moist. Posterior pharynx clear of any exudate or lesions.Normal dentition.  Neck: normal, supple, no masses, no thyromegaly Respiratory: clear to auscultation bilaterally, no wheezing, no crackles. Normal respiratory effort. No accessory muscle use.  Cardiovascular: Regular rate and rhythm, no murmurs / rubs / gallops. No extremity edema. 2+ pedal pulses. No carotid bruits.  Abdomen: no tenderness, no masses palpated. No hepatosplenomegaly. Bowel sounds positive.  Musculoskeletal: no clubbing / cyanosis. No joint deformity upper and lower extremities. Good ROM, no contractures. Normal muscle tone.  Skin: no rashes, lesions, ulcers. No induration Neurologic: CN 2-12 grossly intact. Sensation intact, DTR normal. Strength 5/5 in all 4.  Psychiatric: Normal judgment and insight. Alert and oriented x 3. Normal mood.     Labs on Admission: I have personally reviewed following labs and imaging studies  CBC: Recent Labs  Lab 05/31/22 1726  WBC 11.3*  NEUTROABS 9.0*  HGB 9.8*  HCT 30.7*  MCV 81.9  PLT 209*   Basic Metabolic Panel: Recent Labs  Lab 05/31/22 1726 06/01/22 0434  NA 122* 126*  K 5.4* 4.7  CL 97* 102  CO2 20* 19*  GLUCOSE 124* 120*  BUN 23* 20  CREATININE 0.62 0.43*  CALCIUM 8.3* 7.5*   GFR: Estimated Creatinine Clearance: 49 mL/min (A) (by C-G formula based on SCr of 0.43 mg/dL (L)). Liver Function Tests: Recent Labs  Lab 05/31/22 1726  AST 41  ALT 25  ALKPHOS 81  BILITOT 1.1  PROT 6.1*  ALBUMIN 2.9*   No results for input(s): "LIPASE", "AMYLASE" in the last 168 hours. No results for input(s): "AMMONIA" in the last 168 hours. Coagulation Profile: No results for input(s): "INR", "PROTIME" in the last 168 hours. Cardiac Enzymes: No results for  input(s): "CKTOTAL", "CKMB", "CKMBINDEX", "TROPONINI" in the last 168 hours. BNP (last 3 results) No results for input(s): "PROBNP" in the last 8760 hours. HbA1C: No results  for input(s): "HGBA1C" in the last 72 hours. CBG: No results for input(s): "GLUCAP" in the last 168 hours. Lipid Profile: No results for input(s): "CHOL", "HDL", "LDLCALC", "TRIG", "CHOLHDL", "LDLDIRECT" in the last 72 hours. Thyroid Function Tests: No results for input(s): "TSH", "T4TOTAL", "FREET4", "T3FREE", "THYROIDAB" in the last 72 hours. Anemia Panel: No results for input(s): "VITAMINB12", "FOLATE", "FERRITIN", "TIBC", "IRON", "RETICCTPCT" in the last 72 hours. Urine analysis:    Component Value Date/Time   COLORURINE AMBER (A) 04/28/2022 0657   APPEARANCEUR HAZY (A) 04/28/2022 0657   LABSPEC 1.020 04/28/2022 0657   PHURINE 5.0 04/28/2022 [place] 04/28/2022 0657   HGBUR NEGATIVE 04/28/2022 0657   BILIRUBINUR NEGATIVE 04/28/2022 0657   KETONESUR NEGATIVE 04/28/2022 0657   PROTEINUR NEGATIVE 04/28/2022 0657   NITRITE NEGATIVE 04/28/2022 0657   LEUKOCYTESUR NEGATIVE 04/28/2022 0657    Radiological Exams on Admission: CT Angio Chest PE W and/or Wo Contrast  Result Date: 05/31/2022 CLINICAL DATA:  Chest pain and shortness of breath, history of hepatocellular carcinoma EXAM: CT ANGIOGRAPHY CHEST WITH CONTRAST TECHNIQUE: Multidetector CT imaging of the chest was performed using the standard protocol during bolus administration of intravenous contrast. Multiplanar CT image reconstructions and MIPs were obtained to evaluate the vascular anatomy. RADIATION DOSE REDUCTION: This exam was performed according to the departmental dose-optimization program which includes automated exposure control, adjustment of the mA and/or kV according to patient size and/or use of iterative reconstruction technique. CONTRAST:  56mL OMNIPAQUE IOHEXOL 350 MG/ML SOLN COMPARISON:  Chest CT dated February 21, 2022 FINDINGS:  Cardiovascular: No evidence of pulmonary embolus. Normal heart size. No pericardial effusion. Normal caliber thoracic aorta with mild atherosclerotic disease. Mediastinum/Nodes: Thyroid is unremarkable. Paraesophageal varices. No pathologically enlarged lymph nodes seen in the chest. Lungs/Pleura: Central airways are patent. Moderate centrilobular emphysema. Solid pulmonary nodule of the right upper lobe measuring 4 mm on series 6, image 59, previously measured 2 mm. Moderate left pleural effusion with PleurX catheter in place. Upper Abdomen: Cirrhotic liver morphology. Lesion of the left hepatic lobe measuring 2.8 x 1.6 cm and irregular lesion of the posterior right hepatic lobe measuring approximally 4.1 x 3.2 cm, similar in size when compared with prior CT, evaluation is limited due to lack of IV contrast. Abdominal ascites and upper abdominal varices. Musculoskeletal: No chest wall abnormality. No acute or significant osseous findings. Review of the MIP images confirms the above findings. IMPRESSION: 1. No evidence of pulmonary embolus. 2. Moderate left pleural effusion with PleurX catheter in place. 3. Cirrhotic liver morphology with sequela of portal hypertension. Lesions of the left and right hepatic lobes, compatible with known primary malignancy. 4. Solid 4 mm pulmonary nodule of the right upper lobe is slightly increased in size when compared with most recent prior chest CT. Recommend attention on follow-up per clinical protocol. Electronically Signed   By: Yetta Glassman M.D.   On: 05/31/2022 19:44   DG Chest 2 View  Result Date: 05/31/2022 CLINICAL DATA:  Shortness of breath. EXAM: CHEST - 2 VIEW COMPARISON:  Chest x-rays dated 05/28/2022 and 04/22/2022. Chest CT dated 02/21/2022 FINDINGS: LEFT-sided chest tube is stable in position with tip directed towards the LEFT lung apex. Stable small residual LEFT pleural effusion. RIGHT lung is clear. No pneumothorax is seen. Heart size and mediastinal  contours are within normal limits, stable osseous structures about the chest are unremarkable. IMPRESSION: Stable chest x-ray. LEFT-sided chest tube is stable in position. Small residual LEFT pleural effusion is stable. No pneumothorax seen.  Electronically Signed   By: Franki Cabot M.D.   On: 05/31/2022 17:44    EKG: Independently reviewed.  Sinus, no acute ST changes.  Assessment/Plan Principal Problem:   Hyponatremia  (please populate well all problems here in Problem List. (For example, if patient is on BP meds at home and you resume or decide to hold them, it is a problem that needs to be her. Same for CAD, COPD, HLD and so on)  Severe hyponatremia -Acute on chronic, reviewed patient's last admission nephrology's note, and work-up at that admission to rule out SIADH as urine sodium was low.  Volume status is probably mild volume contracted as there is a increased output from that left-sided Pleurx catheter compared to before.  She does have orthostasis, despite on midodrine.  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  Check cortisol level, and start fludrocortisone in AM. -Another factor is patient reported that she uses water for hydration, so there might be a dilution there.  Recommend she uses Gatorade for ounces per ounce Pleurx output.  Both patient and her husband expressed understanding and agreed. -As for other DDx, she has baseline cirrhosis but there is no significant peripheral edema and she still has signs of volume contraction but not overload or dilutional. -Probably will be a fine balance she has to find, recommend she weigh herself every day as well as keep record of Pleurx output, and drink correspondingly amount of Gatorade or homemade normal saline. -This morning's sodium 126, ending at less than 0.5 mEq correction per hour, recheck sodium level this afternoon and tomorrow morning.  I do believe once  her sodium level above 130, she is stable to discharge.  Severe protein calorie malnutrition -Dietitian consultation  Cirrhosis -With impaired synthetic function of coagulopathy and chronic hypoalbuminemia -Continue lactulose -Volume contracted, no indication for Lasix -Vitamin K for cholic acid recheck level tomorrow.  Chronic iron deficiency anemia -No significant active bleeding, start iron supplement.  Sided pleural effusion persistent -Pleurx kit ordered  Breast cancer -Outpatient follow-up with oncology   DVT prophylaxis: SCD Code Status: Full code Family Communication: Husband at bedside Disposition Plan: Expect more than 2 midnight hospital stay to gradually correct sodium level Consults called: None Admission status: MedSurg admission   Lequita Halt MD [facility] Pager [phone]  06/01/2022, 8:45 AM

## 2022-06-02 DIAGNOSIS — E43 Unspecified severe protein-calorie malnutrition: Secondary | ICD-10-CM

## 2022-06-02 DIAGNOSIS — J90 Pleural effusion, not elsewhere classified: Secondary | ICD-10-CM | POA: Diagnosis not present

## 2022-06-02 DIAGNOSIS — E875 Hyperkalemia: Secondary | ICD-10-CM

## 2022-06-02 DIAGNOSIS — E872 Acidosis, unspecified: Secondary | ICD-10-CM

## 2022-06-02 DIAGNOSIS — K7460 Unspecified cirrhosis of liver: Secondary | ICD-10-CM | POA: Diagnosis not present

## 2022-06-02 DIAGNOSIS — E871 Hypo-osmolality and hyponatremia: Secondary | ICD-10-CM | POA: Diagnosis not present

## 2022-06-02 DIAGNOSIS — D649 Anemia, unspecified: Secondary | ICD-10-CM | POA: Diagnosis not present

## 2022-06-02 DIAGNOSIS — C50919 Malignant neoplasm of unspecified site of unspecified female breast: Secondary | ICD-10-CM | POA: Diagnosis not present

## 2022-06-02 DIAGNOSIS — C220 Liver cell carcinoma: Secondary | ICD-10-CM | POA: Diagnosis not present

## 2022-06-02 DIAGNOSIS — R180 Malignant ascites: Secondary | ICD-10-CM | POA: Diagnosis not present

## 2022-06-02 DIAGNOSIS — I959 Hypotension, unspecified: Secondary | ICD-10-CM | POA: Diagnosis not present

## 2022-06-02 LAB — BASIC METABOLIC PANEL
Anion gap: 4 — ABNORMAL LOW (ref 5–15)
BUN: 19 mg/dL (ref 6–20)
CO2: 17 mmol/L — ABNORMAL LOW (ref 22–32)
Calcium: 7.6 mg/dL — ABNORMAL LOW (ref 8.9–10.3)
Chloride: 106 mmol/L (ref 98–111)
Creatinine, Ser: 0.64 mg/dL (ref 0.44–1.00)
GFR, Estimated: >60 mL/min (ref >60)
Glucose, Bld: 127 mg/dL — ABNORMAL HIGH (ref 70–99)
Potassium: 5.5 mmol/L — ABNORMAL HIGH (ref 3.5–5.1)
Sodium: 127 mmol/L — ABNORMAL LOW (ref 135–145)

## 2022-06-02 LAB — CBC
HCT: 31.7 % — ABNORMAL LOW (ref 36.0–46.0)
Hemoglobin: 9.8 g/dL — ABNORMAL LOW (ref 12.0–15.0)
MCH: 26.3 pg (ref 26.0–34.0)
MCHC: 30.9 g/dL (ref 30.0–36.0)
MCV: 85.2 fL (ref 80.0–100.0)
Platelets: 122 10*3/uL — ABNORMAL LOW (ref 150–400)
RBC: 3.72 MIL/uL — ABNORMAL LOW (ref 3.87–5.11)
RDW: 23.4 % — ABNORMAL HIGH (ref 11.5–15.5)
WBC: 10.2 10*3/uL (ref 4.0–10.5)
nRBC: 0 % (ref 0.0–0.2)

## 2022-06-02 MED ORDER — BISACODYL 10 MG RE SUPP: 10.0000 mg | Freq: Every day | RECTAL | Status: DC | PRN

## 2022-06-02 MED ORDER — ALBUMIN HUMAN 25 % IV SOLN
12.5000 g | Freq: Four times a day (QID) | INTRAVENOUS | Status: DC
  Administered 2022-06-02: 12.5 g via INTRAVENOUS
  Filled 2022-06-02 (×2): qty 50

## 2022-06-02 MED ORDER — SODIUM CHLORIDE 1 G PO TABS: 1.0000 g | ORAL_TABLET | Freq: Two times a day (BID) | ORAL | 1 refills | Status: DC

## 2022-06-02 MED ORDER — SENNOSIDES-DOCUSATE SODIUM 8.6-50 MG PO TABS: 1.0000 | ORAL_TABLET | Freq: Two times a day (BID) | ORAL | Status: DC | PRN

## 2022-06-02 MED ORDER — SODIUM ZIRCONIUM CYCLOSILICATE 10 G PO PACK
10.0000 g | PACK | Freq: Once | ORAL | Status: AC
Start: 2022-06-02 — End: 2022-06-02
  Administered 2022-06-02: 10 g via ORAL
  Filled 2022-06-02: qty 1

## 2022-06-02 NOTE — Discharge Summary (Signed)
Physician Discharge Summary   Patient: Theresa Rocha MRN: [id number] DOB: [birth year]  Admit date:     05/31/2022  Discharge date: 06/02/22  Discharge Physician: Edwin Dada   PCP: Seward Carol, MD     Recommendations at discharge:  Follow up with [name] for BMP in 1 week Repeat BMP early next week to recheck Na (was 127 at hospital discharge)     Discharge Diagnoses: Principal Problem:   Symptomatic hyponatremia Active Problems:   Hyperkalemia   Anemia   Malignant neoplasm of central portion of right breast in female, estrogen receptor positive ([place])   Metabolic acidosis   Hypotension   Protein-calorie malnutrition, severe (HCC)   Cirrhosis [facility])          Hospital Course: Theresa Rocha is a 56 y.o. F with cirrhosis and HCC c/b recurrent left pleural effusion with PleurX, chronic hyponatremia, and anemia and COPD who presented with lightheadedness, progressive over aq few days.  Admitted 5/17-6/5 for same.  PleurX is draining almost 1L per day.  In the ER, sodium 122.  CT angiogram showed moderate pleural effusion.     * Hyponatremia During her last (prolonged) admission, midodrine was started, diuretics were stopped, albumin was used and diet was liberalized to improve her sodium.  Since discharge, she has continued to have high output from PleurX and her hypoNa recurred within a month.   Here, she was given albumin again, midodrine was held and she got fludricortisone.  Nephrology were consulted, and her symptoms resolved completely and she was eager for dsicharge.  Sodium came up to 127 and so she was transitioned to salt tabs, midodrine was continued, and she was discharged with close follow up for labs next week   Hyperkalemia Mild.  Given Lokelma.  - Repeat BMP next week             The [place] was reviewed for this patient prior to discharge.   Consultants: Nephrology   Disposition:  Home Diet recommendation: Regular      DISCHARGE MEDICATION: Allergies as of 06/02/2022       Reactions   Hydromorphone Hcl Nausea And Vomiting   Severe vomiting   Aspirin Nausea Only        Medication List     TAKE these medications    baclofen 10 MG tablet Commonly known as: LIORESAL Take 5 mg by mouth daily as needed for muscle spasms.   dicyclomine 20 MG tablet Commonly known as: BENTYL Take 0.5 tablets (10 mg total) by mouth every 6 (six) hours as needed for spasms. Take one tablet every 6-8 hours as needed for abdominal cramping What changed:  how much to take when to take this reasons to take this additional instructions   gabapentin 300 MG capsule Commonly known as: NEURONTIN Take 1 capsule (300 mg total) by mouth at bedtime.   lactulose 10 GM/15ML solution Commonly known as: CHRONULAC Take 15 mLs (10 g total) by mouth 2 (two) times daily as needed for mild constipation.   midodrine 5 MG tablet Commonly known as: PROAMATINE Take 3 tablets (15 mg total) by mouth 3 (three) times daily with meals.   nicotine 21 mg/24hr patch Commonly known as: NICODERM CQ - dosed in mg/24 hours Place 21 mg onto the skin daily as needed (smoking cessation on work days).   oxyCODONE 5 MG immediate release tablet Commonly known as: Oxy IR/ROXICODONE Take 1-2 tablets (5-10 mg total) by mouth every 4 (four)  hours as needed for severe pain. What changed:  how much to take when to take this reasons to take this   pantoprazole 40 MG tablet Commonly known as: PROTONIX Take 1 tablet (40 mg total) by mouth daily.   PRENATAL PO Take 1 tablet by mouth daily.   promethazine 12.5 MG tablet Commonly known as: PHENERGAN Take 1 tablet (12.5 mg total) by mouth every 6 (six) hours as needed for nausea or vomiting. What changed: when to take this   sodium chloride 1 g tablet Take 1 tablet (1 g total) by mouth 2 (two) times daily with a meal.   tamoxifen 20 MG tablet Commonly  known as: NOLVADEX TAKE 1 TABLET BY MOUTH EVERY DAY What changed: when to take this        Follow-up Information     Truitt Merle, MD Follow up.   Specialties: Hematology, Oncology Contact information: [place] [address] [phone]                 Discharge Instructions     Diet - low sodium heart healthy   Complete by: As directed    Discharge instructions   Complete by: As directed    From Dr. Loleta Books: You were admitted for weakness and here we found your sodium was low again. You were treated with albumin and salt tabs and fludricortisone, and your symptoms thankfully got better.  When you go home, continue your midodrine to keep your blood pressure up and continue eating a liberal protein rich diet These are the two things that should help the most to improve your sodium and kidney function  Fixing low sodium isn't as simple as taking salt tabs (the body handles salt and water in a complex way) but Dr. Marval Regal and I think that starting salt tabs may still be wise for you  Take sodium chloride 1g twice daily  For a dairy-free protein supplement option, explore the two following brands: Orgain and Vega  Go to [name] to get labs drawn early next week   Increase activity slowly   Complete by: As directed    No wound care   Complete by: As directed    Pleural Drainage Schedule   Complete by: As directed    Pleural drainage schedule to start today. Drain daily, up to max of 1L until patient is only able to drain out 172m. If <1543mfor 3 consecutive drains then drain every other day. If <15052mor 3 consecutive drains every other day then call the practice that inserted the catheter for evaluation and possible removal. [facility] office ([phone] Interventional Radiology ([phone] Dr. OakGenevive [phone] [facility] ([phone]      Discharge Exam: Filed Weights   05/31/22 1654 06/01/22 0229  Weight: 38.1  kg 39.1 kg    General: Pt is alert, awake, not in acute distress, thin, sitting up in bed Cardiovascular: RRR, nl S1-S2, no murmurs appreciated.   No LE edema.   Respiratory: Normal respiratory rate and rhythm.  CTAB without rales or wheezes. Abdominal: Abdomen soft and non-tender.  No distension or HSM.   Neuro/Psych: Strength symmetric in upper and lower extremities.  Judgment and insight appear normal.   Condition at discharge: good  The results of significant diagnostics from this hospitalization (including imaging, microbiology, ancillary and laboratory) are listed below for reference.   Imaging Studies: CT Angio Chest PE W and/or Wo Contrast  Result Date: 05/31/2022 CLINICAL DATA:  Chest  pain and shortness of breath, history of hepatocellular carcinoma EXAM: CT ANGIOGRAPHY CHEST WITH CONTRAST TECHNIQUE: Multidetector CT imaging of the chest was performed using the standard protocol during bolus administration of intravenous contrast. Multiplanar CT image reconstructions and MIPs were obtained to evaluate the vascular anatomy. RADIATION DOSE REDUCTION: This exam was performed according to the departmental dose-optimization program which includes automated exposure control, adjustment of the mA and/or kV according to patient size and/or use of iterative reconstruction technique. CONTRAST:  56m OMNIPAQUE IOHEXOL 350 MG/ML SOLN COMPARISON:  Chest CT dated February 21, 2022 FINDINGS: Cardiovascular: No evidence of pulmonary embolus. Normal heart size. No pericardial effusion. Normal caliber thoracic aorta with mild atherosclerotic disease. Mediastinum/Nodes: Thyroid is unremarkable. Paraesophageal varices. No pathologically enlarged lymph nodes seen in the chest. Lungs/Pleura: Central airways are patent. Moderate centrilobular emphysema. Solid pulmonary nodule of the right upper lobe measuring 4 mm on series 6, image 59, previously measured 2 mm. Moderate left pleural effusion with PleurX catheter in  place. Upper Abdomen: Cirrhotic liver morphology. Lesion of the left hepatic lobe measuring 2.8 x 1.6 cm and irregular lesion of the posterior right hepatic lobe measuring approximally 4.1 x 3.2 cm, similar in size when compared with prior CT, evaluation is limited due to lack of IV contrast. Abdominal ascites and upper abdominal varices. Musculoskeletal: No chest wall abnormality. No acute or significant osseous findings. Review of the MIP images confirms the above findings. IMPRESSION: 1. No evidence of pulmonary embolus. 2. Moderate left pleural effusion with PleurX catheter in place. 3. Cirrhotic liver morphology with sequela of portal hypertension. Lesions of the left and right hepatic lobes, compatible with known primary malignancy. 4. Solid 4 mm pulmonary nodule of the right upper lobe is slightly increased in size when compared with most recent prior chest CT. Recommend attention on follow-up per clinical protocol. Electronically Signed   By: LYetta GlassmanM.D.   On: 05/31/2022 19:44   DG Chest 2 View  Result Date: 05/31/2022 CLINICAL DATA:  Shortness of breath. EXAM: CHEST - 2 VIEW COMPARISON:  Chest x-rays dated 05/28/2022 and 04/22/2022. Chest CT dated 02/21/2022 FINDINGS: LEFT-sided chest tube is stable in position with tip directed towards the LEFT lung apex. Stable small residual LEFT pleural effusion. RIGHT lung is clear. No pneumothorax is seen. Heart size and mediastinal contours are within normal limits, stable osseous structures about the chest are unremarkable. IMPRESSION: Stable chest x-ray. LEFT-sided chest tube is stable in position. Small residual LEFT pleural effusion is stable. No pneumothorax seen. Electronically Signed   By: SFranki CabotM.D.   On: 05/31/2022 17:44   DG Chest 2 View  Result Date: 05/29/2022 CLINICAL DATA:  Recurrent pleural effusion. EXAM: CHEST - 2 VIEW COMPARISON:  Apr 22, 2022 FINDINGS: A left-sided chest tube is seen with its distal tip overlying the medial  aspect of the left apex. The heart size and mediastinal contours are within normal limits. There is a small to moderate sized left pleural effusion. This is increased in severity when compared to the prior study. There is no evidence of acute infiltrate or pneumothorax. Radiopaque surgical clips are seen overlying the lateral aspect of the mid and upper right hemithorax. The visualized skeletal structures are unremarkable. IMPRESSION: Small to moderate sized left pleural effusion, increased in severity when compared to the prior study. Electronically Signed   By: TVirgina NorfolkM.D.   On: 05/29/2022 01:50   IR PATIENT EVAL TECH 0-60 MINS  Result Date: 05/19/2022 HJanine Limbo RT  05/19/2022 11:12 AM Assisted NP Soyla Dryer with attention to tunneled left pleural drain. Pt being seen for c/o pain since placement. Dressing was removed for evaluation and new dressing was applied.  MM DIAG BREAST TOMO BILATERAL  Result Date: 05/13/2022 CLINICAL DATA:  Right lumpectomy.  Annual mammography. EXAM: DIGITAL DIAGNOSTIC BILATERAL MAMMOGRAM WITH TOMOSYNTHESIS AND CAD TECHNIQUE: Bilateral digital diagnostic mammography and breast tomosynthesis was performed. The images were evaluated with computer-aided detection. COMPARISON:  Previous exam(s). ACR Breast Density Category c: The breast tissue is heterogeneously dense, which may obscure small masses. FINDINGS: There are new coarse and heterogeneous calcifications in the slightly lateral superior right breast anterior to the lumpectomy site spanning 6 mm. These were not magnified today as the patient has multiple other medical issues and is very weak due to a recent hospital admission. The lumpectomy site was not magnified for the same reason. No other suspicious findings are identified in either breast. IMPRESSION: New calcifications in the slightly lateral superior right breast anterior to the lumpectomy site. The patient was not able to have these  calcifications magnified today. However, they will almost certainly require biopsy once magnified. No other suspicious findings. RECOMMENDATION: Recommend bringing the patient back for magnified views of the right breast calcifications. There is a high likelihood the calcifications will need to be biopsied once magnified. The patient should be scheduled for biopsy on the same day as her magnified views. She is very weak and would like to regain her strength and weight before proceeding. She will contact us when she is able to proceed. I have discussed the findings and recommendations with the patient. If applicable, a reminder letter will be sent to the patient regarding the next appointment. BI-RADS CATEGORY  4: Suspicious. Electronically Signed   By: Dorise Bullion III M.D.   On: 05/13/2022 12:19   Microbiology: Results for orders placed or performed during the hospital encounter of 04/14/22  Body fluid culture w Gram Stain     Status: None   Collection Time: 04/16/22  8:38 AM   Specimen: Pleura; Body Fluid  Result Value Ref Range Status   Specimen Description   Final    PLEURAL LT Performed at [facility] [address] [place], [place] [address]    Special Requests   Final    Normal Performed at [facility], [place] [address] [place], [place] [address]    Gram Stain   Final    WBC PRESENT, PREDOMINANTLY PMN NO ORGANISMS SEEN CYTOSPIN SMEAR    Culture   Final    NO GROWTH 3 DAYS Performed at [facility], [place] [address] [place], [place] [address]    Report Status 04/19/2022 FINAL  Final  Resp Panel by RT-PCR (Flu A&B, Covid) Nasopharyngeal Swab     Status: None   Collection Time: 04/18/22  9:52 AM   Specimen: Nasopharyngeal Swab; Nasopharyngeal(NP) swabs in vial transport medium  Result Value Ref Range Status   SARS Coronavirus 2 by RT PCR NEGATIVE NEGATIVE Final    Comment: (NOTE) SARS-CoV-2 target nucleic acids are NOT  DETECTED.  The SARS-CoV-2 RNA is generally detectable in upper respiratory specimens during the acute phase of infection. The lowest concentration of SARS-CoV-2 viral copies this assay can detect is 138 copies/mL. A negative result does not preclude SARS-Cov-2 infection and should not be used as the sole basis for treatment or other patient management decisions. A negative result may occur with  improper specimen collection/handling, submission of  specimen other than nasopharyngeal swab, presence of viral mutation(s) within the areas targeted by this assay, and inadequate number of viral copies(<138 copies/mL). A negative result must be combined with clinical observations, patient history, and epidemiological information. The expected result is Negative.  Fact Sheet for Patients:  EntrepreneurPulse.com.au  Fact Sheet for Healthcare Providers:  IncredibleEmployment.be  This test is no t yet approved or cleared by the [place] FDA and  has been authorized for detection and/or diagnosis of SARS-CoV-2 by FDA under an Emergency Use Authorization (EUA). This EUA will remain  in effect (meaning this test can be used) for the duration of the COVID-19 declaration under Section 564(b)(1) of the Act, 21 U.S.C.section 360bbb-3(b)(1), unless the authorization is terminated  or revoked sooner.       Influenza A by PCR NEGATIVE NEGATIVE Final   Influenza B by PCR NEGATIVE NEGATIVE Final    Comment: (NOTE) The Xpert Xpress SARS-CoV-2/FLU/RSV plus assay is intended as an aid in the diagnosis of influenza from Nasopharyngeal swab specimens and should not be used as a sole basis for treatment. Nasal washings and aspirates are unacceptable for Xpert Xpress SARS-CoV-2/FLU/RSV testing.  Fact Sheet for Patients: EntrepreneurPulse.com.au  Fact Sheet for Healthcare Providers: IncredibleEmployment.be  This test is not yet  approved or cleared by the [place] FDA and has been authorized for detection and/or diagnosis of SARS-CoV-2 by FDA under an Emergency Use Authorization (EUA). This EUA will remain in effect (meaning this test can be used) for the duration of the COVID-19 declaration under Section 564(b)(1) of the Act, 21 U.S.C. section 360bbb-3(b)(1), unless the authorization is terminated or revoked.  Performed at [facility], [place] [address] [place], [place] [address]   MRSA Next Gen by PCR, Nasal     Status: None   Collection Time: 04/19/22  2:23 PM   Specimen: Nasal Mucosa; Nasal Swab  Result Value Ref Range Status   MRSA by PCR Next Gen NOT DETECTED NOT DETECTED Final    Comment: (NOTE) The GeneXpert MRSA Assay (FDA approved for NASAL specimens only), is one component of a comprehensive MRSA colonization surveillance program. It is not intended to diagnose MRSA infection nor to guide or monitor treatment for MRSA infections. Test performance is not FDA approved in patients less than 15 years old. Performed at [facility], [place] [address] [place], [place] [address]   Culture, body fluid w Gram Stain-bottle     Status: None   Collection Time: 04/19/22  2:23 PM   Specimen: Pleura  Result Value Ref Range Status   Specimen Description PLEURAL  Final   Special Requests NONE  Final   Culture   Final    NO GROWTH 5 DAYS Performed at [facility], [address], [place], [place] [address]    Report Status 04/24/2022 FINAL  Final  Gram stain     Status: None   Collection Time: 04/19/22  2:23 PM   Specimen: Pleura  Result Value Ref Range Status   Specimen Description PLEURAL  Final   Special Requests NONE  Final   Gram Stain   Final    WBC PRESENT,BOTH PMN AND MONONUCLEAR NO ORGANISMS SEEN CYTOSPIN SMEAR Performed at [facility], [address] [place], [place] [address]    Report Status 04/20/2022 FINAL  Final  Respiratory (~20  pathogens) panel by PCR     Status: None   Collection Time: 04/19/22  2:59 PM  Result Value Ref Range  Status   Adenovirus NOT DETECTED NOT DETECTED Final   Coronavirus 229E NOT DETECTED NOT DETECTED Final    Comment: (NOTE) The Coronavirus on the Respiratory Panel, DOES NOT test for the novel  Coronavirus (2019 nCoV)    Coronavirus HKU1 NOT DETECTED NOT DETECTED Final   Coronavirus NL63 NOT DETECTED NOT DETECTED Final   Coronavirus OC43 NOT DETECTED NOT DETECTED Final   Metapneumovirus NOT DETECTED NOT DETECTED Final   Rhinovirus / Enterovirus NOT DETECTED NOT DETECTED Final   Influenza A NOT DETECTED NOT DETECTED Final   Influenza B NOT DETECTED NOT DETECTED Final   Parainfluenza Virus 1 NOT DETECTED NOT DETECTED Final   Parainfluenza Virus 2 NOT DETECTED NOT DETECTED Final   Parainfluenza Virus 3 NOT DETECTED NOT DETECTED Final   Parainfluenza Virus 4 NOT DETECTED NOT DETECTED Final   Respiratory Syncytial Virus NOT DETECTED NOT DETECTED Final   Bordetella pertussis NOT DETECTED NOT DETECTED Final   Bordetella Parapertussis NOT DETECTED NOT DETECTED Final   Chlamydophila pneumoniae NOT DETECTED NOT DETECTED Final   Mycoplasma pneumoniae NOT DETECTED NOT DETECTED Final    Comment: Performed at [facility], [place] [address], [place], [place] [address]  Culture, blood (Routine X 2) w Reflex to ID Panel     Status: None   Collection Time: 04/19/22  3:35 PM   Specimen: BLOOD  Result Value Ref Range Status   Specimen Description   Final    BLOOD BLOOD LEFT FOREARM Performed at [facility] [address] [place], [place] [address]    Special Requests   Final    BOTTLES DRAWN AEROBIC AND ANAEROBIC Blood Culture adequate volume Performed at [facility] [address] [place], [place] [address]    Culture   Final    NO GROWTH 5 DAYS Performed at [facility], [place] [address], [place], [place] [address]    Report Status 04/24/2022  FINAL  Final  Culture, blood (Routine X 2) w Reflex to ID Panel     Status: None   Collection Time: 04/19/22  3:35 PM   Specimen: BLOOD  Result Value Ref Range Status   Specimen Description   Final    BLOOD LEFT ANTECUBITAL Performed at [facility] [address], [place], [place] [address]    Special Requests   Final    BOTTLES DRAWN AEROBIC AND ANAEROBIC Blood Culture results may not be optimal due to an inadequate volume of blood received in culture bottles Performed at [facility] [address] [place], [place] [address]    Culture   Final    NO GROWTH 5 DAYS Performed at [facility], [place] [address] [place],  [address]    Report Status 04/24/2022 FINAL  Final    Labs: CBC: Recent Labs  Lab 05/31/22 1726 06/02/22 0042  WBC 11.3* 10.2  NEUTROABS 9.0*  --   HGB 9.8* 9.8*  HCT 30.7* 31.7*  MCV 81.9 85.2  PLT 128* 389*   Basic Metabolic Panel: Recent Labs  Lab 06/01/22 0847 06/01/22 1032 06/01/22 1335 06/01/22 1606 06/01/22 2057 06/02/22 0042  NA 126* 126* 127* 125* 126* 127*  K 4.8 5.1  --  5.2* 5.4* 5.5*  CL 101 103  --  105 106 106  CO2 19* 19*  --  16* 15* 17*  GLUCOSE 125* 133*  --  131* 130* 127*  BUN 19 20  --  19 19 19  CREATININE 0.48 0.47  --  0.59 0.66 0.64  CALCIUM 7.5* 7.3*  --  7.4* 7.2* 7.6*   Liver Function Tests: Recent Labs  Lab 05/31/22 1726  AST 41  ALT 25  ALKPHOS 81  BILITOT 1.1  PROT 6.1*  ALBUMIN 2.9*   CBG: No results for input(s): "GLUCAP" in the last 168 hours.  Discharge time spent: approximately 35 minutes spent on discharge counseling, evaluation of patient on day of discharge, and coordination of discharge planning with nursing, social work, pharmacy and case management  Signed: Edwin Dada, MD [facility] 06/02/2022

## 2022-06-02 NOTE — Assessment & Plan Note (Signed)
Chronic. 

## 2022-06-02 NOTE — Assessment & Plan Note (Signed)
Stable close to baseline

## 2022-06-02 NOTE — Assessment & Plan Note (Addendum)
During her last (prolonged) admission, midodrine was started, diuretics were stopped, albumin was used and diet was liberalized to improve her sodium.  Here, despite being on midodrine and liberal diet at home, she continues to have high output from PleurX and her hypoNa recurred within a month.  Na slightly better since admission.  Notably, she says she feels much better already since arriving to the hospital, is eager to go home. - Hold diuretics - Give albumin - Continue midodrine - Liberal diet - Consult Nephrology

## 2022-06-02 NOTE — Assessment & Plan Note (Addendum)
Family report weight loss, poor stool output from lactulose. - Liberal diet - Hold diuretics, lactulose for now

## 2022-06-02 NOTE — Consult Note (Addendum)
Reason for Consult: Hyponatremia Referring Physician: Loleta Books, MD  Theresa Rocha is an 56 y.o. 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  In the ED, BP 97/62, SpO2 99%, CXR with stable left pleural effusion.  CT angio negative for pulmonary embolus, moderate left pleural effusion, 4 mm pulmonary nodule in RUL.  Labs were notable for WBC 11.3, Hgb 9.8, plt 128, Na 122, K 5.4, BUN 23, alb 2.9.  She was admitted for further management of her acute on chronic hyponatremia.  She also had 1 liter of drainage from left pleurx catheter today and is feeling better.  The trend in Scr is seen below.    She denies any N/V/D, SOB, HA, AMS.    She was seen during her last hospitalization in May/June 2023 by our service due to recurrent acute on chronic hyponatremia.  She was started on Midodrine and octreotide, as well as IV albumin.  Her octreotide was discontinued upon discharge as there is no outpatient option.    Trend in Serum Sodium: Sodium  Date/Time Value Ref Range Status  06/02/2022 12:42 AM 127 (L) 135 - 145 mmol/L Final  06/01/2022 08:57 PM 126 (L) 135 - 145 mmol/L Final  06/01/2022 04:06 PM 125 (L) 135 - 145 mmol/L Final  06/01/2022 01:35 PM 127 (L) 135 - 145 mmol/L Final  06/01/2022 10:32 AM 126 (L) 135 - 145 mmol/L Final  06/01/2022 08:47 AM 126 (L) 135 - 145 mmol/L Final  06/01/2022 04:34 AM 126 (L) 135 - 145 mmol/L Final  05/31/2022 05:26 PM 122 (L) 135 - 145 mmol/L Final  05/11/2022 11:05 AM 137 135 - 145 mmol/L Final  05/05/2022 10:31 AM 136 135 - 145 mmol/L Final  05/03/2022 03:04 AM 138 135 - 145 mmol/L Final  05/02/2022 03:53 AM 135 135 - 145 mmol/L Final  05/01/2022 04:30 AM 131 (L) 135 - 145 mmol/L Final   04/30/2022 03:30 AM 132 (L) 135 - 145 mmol/L Final  04/29/2022 03:40 AM 132 (L) 135 - 145 mmol/L Final  04/28/2022 06:30 AM 126 (L) 135 - 145 mmol/L Final  04/27/2022 04:00 AM 129 (L) 135 - 145 mmol/L Final  04/26/2022 12:01 PM 126 (L) 135 - 145 mmol/L Final  04/26/2022 10:48 AM 125 (L) 135 - 145 mmol/L Final  04/25/2022 04:30 AM 131 (L) 135 - 145 mmol/L Final  04/24/2022 05:22 AM 129 (L) 135 - 145 mmol/L Final  04/23/2022 04:41 AM 130 (L) 135 - 145 mmol/L Final  04/22/2022 04:30 AM 128 (L) 135 - 145 mmol/L Final  04/21/2022 04:30 AM 134 (L) 135 - 145 mmol/L Final  04/20/2022 03:47 AM 136 135 - 145 mmol/L Final  04/19/2022 05:07 AM 136 135 - 145 mmol/L Final  04/18/2022 04:42 AM 133 (L) 135 - 145 mmol/L Final  04/17/2022 05:20 AM 130 (L) 135 - 145 mmol/L Final  04/16/2022 08:44 PM 125 (L) 135 - 145 mmol/L Final  04/16/2022 01:42 PM 124 (L) 135 - 145 mmol/L Final  04/16/2022 09:52 AM 125 (L) 135 - 145 mmol/L Final  04/16/2022 04:57 AM 123 (L) 135 - 145 mmol/L Final  04/15/2022 10:25 PM 127 (L) 135 - 145 mmol/L Final  04/15/2022 04:34 PM 124 (L) 135 - 145 mmol/L Final  04/15/2022 05:03 AM 123 (L) 135 - 145  mmol/L Final  04/14/2022 04:19 PM 124 (L) 135 - 145 mmol/L Final  04/14/2022 12:56 PM 119 (LL) 135 - 145 mmol/L Final  04/08/2022 02:02 PM 121 (L) 135 - 145 mmol/L Final  02/23/2022 04:40 AM 128 (L) 135 - 145 mmol/L Final  02/22/2022 04:39 AM 131 (L) 135 - 145 mmol/L Final  02/21/2022 10:55 AM 127 (L) 135 - 145 mmol/L Final  01/01/2022 02:58 PM 138 135 - 145 mEq/L Final  12/22/2021 08:03 AM 138 135 - 145 mmol/L Final  12/16/2021 07:50 AM 136 135 - 145 mmol/L Final  12/08/2021 02:27 PM 135 135 - 145 mEq/L Final  12/01/2021 10:10 AM 138 135 - 145 mEq/L Final  11/01/2021 05:08 AM 133 (L) 135 - 145 mmol/L Final  10/31/2021 05:47 AM 131 (L) 135 - 145 mmol/L Final  10/30/2021 12:55 AM 132 (L) 135 - 145 mmol/L Final  10/29/2021 01:02 AM 133 (L) 135 - 145 mmol/L Final  10/28/2021 08:46  AM 140 135 - 145 mmol/L Final  10/27/2021 04:29 AM 139 135 - 145 mmol/L Final    PMH:   Past Medical History:  Diagnosis Date   Anemia    Breast cancer ([place]) 08/30/2019   Cancer ([place])    liver- just diagnosed, not started treatment yet   Cervicalgia    COPD (chronic obstructive pulmonary disease) (HCC)    Esophageal varices (HCC)    GERD (gastroesophageal reflux disease)    Headache    Hepatic cirrhosis (HCC)    Hepatitis C    resolved 2018   Hepatocellular carcinoma (HCC)    IBS (irritable bowel syndrome)    Lactose intolerance    Personal history of radiation therapy 11/2019   Right breast   Portal hypertension (HCC)    Raynaud's syndrome    Thrombocytopenia (HCC)     PSH:   Past Surgical History:  Procedure Laterality Date   BREAST BIOPSY Right 09/2019   BREAST LUMPECTOMY Right 09/2019   BREAST LUMPECTOMY WITH AXILLARY LYMPH NODE BIOPSY Right 10/16/2019   Procedure: RIGHT BREAST LUMPECTOMY WITH SENTINEL LYMPH NODE BIOPSY;  Surgeon: Stark Klein, MD;  Location: [facility];  Service: General;  Laterality: Right;   COLONOSCOPY     ESOPHAGEAL BANDING  10/27/2021   Procedure: ESOPHAGEAL BANDING;  Surgeon: Irene Shipper, MD;  Location: [facility];  Service: Endoscopy;;   ESOPHAGOGASTRODUODENOSCOPY (EGD) WITH PROPOFOL N/A 10/27/2021   Procedure: ESOPHAGOGASTRODUODENOSCOPY (EGD) WITH PROPOFOL;  Surgeon: Irene Shipper, MD;  [facility];  Service: Endoscopy;  Laterality: N/A;   ESOPHAGOGASTRODUODENOSCOPY (EGD) WITH PROPOFOL N/A 01/04/2022   Procedure: ESOPHAGOGASTRODUODENOSCOPY (EGD) WITH PROPOFOL;  Surgeon: Irene Shipper, MD;  [facility];  Service: Endoscopy;  Laterality: N/A;   HAMMER TOE SURGERY Left 2012   pins placed in great toe, 2nd,3rd, 4th toes, all pins removed except great toe   IR ANGIOGRAM SELECTIVE EACH ADDITIONAL VESSEL  12/16/2021   IR ANGIOGRAM SELECTIVE EACH ADDITIONAL VESSEL  12/16/2021   IR ANGIOGRAM SELECTIVE EACH ADDITIONAL VESSEL   12/22/2021   IR ANGIOGRAM VISCERAL SELECTIVE  12/16/2021   IR ANGIOGRAM VISCERAL SELECTIVE  12/22/2021   IR EMBO TUMOR ORGAN ISCHEMIA INFARCT INC GUIDE ROADMAPPING  12/22/2021   IR PARACENTESIS  11/13/2019   IR PARACENTESIS  03/04/2022   IR PATIENT EVAL TECH 0-60 MINS  05/19/2022   IR PERC PLEURAL DRAIN W/INDWELL CATH W/IMG GUIDE  03/09/2022   IR RADIOLOGIST EVAL & MGMT  08/16/2019   IR RADIOLOGIST EVAL & MGMT  02/06/2020  IR RADIOLOGIST EVAL & MGMT  06/26/2020   IR RADIOLOGIST EVAL & MGMT  12/31/2020   IR RADIOLOGIST EVAL & MGMT  07/21/2021   IR RADIOLOGIST EVAL & MGMT  10/08/2021   IR RADIOLOGIST EVAL & MGMT  01/13/2022   IR RADIOLOGIST EVAL & MGMT  03/05/2022   IR THORACENTESIS ASP PLEURAL SPACE W/IMG GUIDE  10/12/2021   IR THORACENTESIS ASP PLEURAL SPACE W/IMG GUIDE  10/26/2021   IR THORACENTESIS ASP PLEURAL SPACE W/IMG GUIDE  11/19/2021   IR THORACENTESIS ASP PLEURAL SPACE W/IMG GUIDE  12/22/2021   IR US GUIDE VASC ACCESS RIGHT  12/16/2021   IR US GUIDE VASC ACCESS RIGHT  12/22/2021   PELVIC LAPAROSCOPY Bilateral 1992   RADIOLOGY WITH ANESTHESIA N/A 09/16/2021   Procedure: CT MICROWAVE ABLATION;  Surgeon: Aletta Edouard, MD;  Location: [facility];  Service: Radiology;  Laterality: N/A;   UPPER GASTROINTESTINAL ENDOSCOPY  2002   had esophageal dilitation    Allergies:  Allergies  Allergen Reactions   Hydromorphone Hcl Nausea And Vomiting    Severe vomiting   Aspirin Nausea Only    Medications:   Prior to Admission medications   Medication Sig Start Date End Date Taking? Authorizing Provider  dicyclomine (BENTYL) 20 MG tablet Take 0.5 tablets (10 mg total) by mouth every 6 (six) hours as needed for spasms. Take one tablet every 6-8 hours as needed for abdominal cramping Patient taking differently: Take 20 mg by mouth daily as needed (abdominal cramping). 01/07/22  Yes Noralyn Pick, NP  gabapentin (NEURONTIN) 300 MG capsule Take 1 capsule (300 mg total) by mouth at bedtime. 05/19/22   Yes Truitt Merle, MD  lactulose (CHRONULAC) 10 GM/15ML solution Take 15 mLs (10 g total) by mouth 2 (two) times daily as needed for mild constipation. 05/03/22 08/01/22 Yes Dahal, Marlowe Aschoff, MD  midodrine (PROAMATINE) 5 MG tablet Take 3 tablets (15 mg total) by mouth 3 (three) times daily with meals. 05/03/22 08/01/22 Yes Dahal, Marlowe Aschoff, MD  oxyCODONE (OXY IR/ROXICODONE) 5 MG immediate release tablet Take 1-2 tablets (5-10 mg total) by mouth every 4 (four) hours as needed for severe pain. Patient taking differently: Take 5 mg by mouth daily as needed (pain). 05/24/22  Yes Truitt Merle, MD  pantoprazole (PROTONIX) 40 MG tablet Take 1 tablet (40 mg total) by mouth daily. 12/01/21  Yes Noralyn Pick, NP  Prenatal Vit-Fe Fumarate-FA (PRENATAL PO) Take 1 tablet by mouth daily.   Yes [provider]  promethazine (PHENERGAN) 12.5 MG tablet Take 1 tablet (12.5 mg total) by mouth every 6 (six) hours as needed for nausea or vomiting. Patient taking differently: Take 12.5 mg by mouth daily as needed for nausea or vomiting. 05/19/22  Yes Truitt Merle, MD  tamoxifen (NOLVADEX) 20 MG tablet TAKE 1 TABLET BY MOUTH EVERY DAY Patient taking differently: Take 20 mg by mouth every evening. 10/13/20  Yes Truitt Merle, MD  baclofen (LIORESAL) 10 MG tablet Take 5 mg by mouth daily as needed for muscle spasms. Patient not taking: Reported on 06/01/2022 03/12/22   [provider]  nicotine (NICODERM CQ - DOSED IN MG/24 HOURS) 21 mg/24hr patch Place 21 mg onto the skin daily as needed (smoking cessation on work days). Patient not taking: Reported on 06/01/2022    [provider]    Discontinued Meds:   Medications Discontinued During This Encounter  Medication Reason   albuterol (VENTOLIN HFA) 108 (90 Base) MCG/ACT inhaler 2 puff P&T Policy: Therapeutic Substitute   HYDROmorphone (DILAUDID)  injection 0.5 mg Allergic reaction   sodium chloride tablet 1 g    0.9 %  sodium chloride infusion     Social History:   reports that she has been smoking cigarettes. She started smoking about 43 years ago. She has a 15.00 pack-year smoking history. She has never used smokeless tobacco. She reports that she does not currently use alcohol after a past usage of about 6.0 standard drinks of alcohol per week. She reports that she does not use drugs.  Family History:   Family History  Problem Relation Age of Onset   Liver cancer Mother    Lung cancer Mother    Lung cancer Father    Liver cancer Maternal Grandmother    Colon cancer Neg Hx    Esophageal cancer Neg Hx    Stomach cancer Neg Hx    Rectal cancer Neg Hx     Pertinent items are noted in HPI.  Blood pressure (!) 85/59, pulse 82, temperature 98 F (36.7 C), temperature source Oral, resp. rate 18, height '5\' 3"'$  (1.6 m), weight 39.1 kg, last menstrual period 04/20/2013, SpO2 96 %. General appearance: alert, cooperative, and no distress Head: Normocephalic, without obvious abnormality, atraumatic Eyes: negative findings: lids and lashes normal, conjunctivae and sclerae normal, and corneas clear Resp: clear to auscultation bilaterally Cardio: regular rate and rhythm, S1, S2 normal, no murmur, click, rub or gallop GI: mildly distended, +BS, soft, NT/ND, no guarding or rebound Extremities: extremities normal, atraumatic, no cyanosis or edema  Labs: Basic Metabolic Panel: Recent Labs  Lab 05/31/22 1726 06/01/22 0434 06/01/22 0847 06/01/22 1032 06/01/22 1335 06/01/22 1606 06/01/22 2057 06/02/22 0042  NA 122* 126* 126* 126* 127* 125* 126* 127*  K 5.4* 4.7 4.8 5.1  --  5.2* 5.4* 5.5*  CL 97* 102 101 103  --  105 106 106  CO2 20* 19* 19* 19*  --  16* 15* 17*  GLUCOSE 124* 120* 125* 133*  --  131* 130* 127*  BUN 23* '20 19 20  '$ --  '19 19 19  '$ CREATININE 0.62 0.43* 0.48 0.47  --  0.59 0.66 0.64  ALBUMIN 2.9*  --   --   --   --   --   --   --   CALCIUM 8.3* 7.5* 7.5* 7.3*  --  7.4* 7.2* 7.6*   Liver Function Tests: Recent Labs  Lab 05/31/22 1726   AST 41  ALT 25  ALKPHOS 81  BILITOT 1.1  PROT 6.1*  ALBUMIN 2.9*   No results for input(s): "LIPASE", "AMYLASE" in the last 168 hours. No results for input(s): "AMMONIA" in the last 168 hours. CBC: Recent Labs  Lab 05/31/22 1726 06/02/22 0042  WBC 11.3* 10.2  NEUTROABS 9.0*  --   HGB 9.8* 9.8*  HCT 30.7* 31.7*  MCV 81.9 85.2  PLT 128* 122*   PT/INR: '@labrcntip'$ (inr:5) Cardiac Enzymes: No results for input(s): "CKTOTAL", "CKMB", "CKMBINDEX", "TROPONINI" in the last 168 hours. CBG: No results for input(s): "GLUCAP" in the last 168 hours.  Iron Studies: No results for input(s): "IRON", "TIBC", "TRANSFERRIN", "FERRITIN" in the last 168 hours.  Xrays/Other Studies: CT Angio Chest PE W and/or Wo Contrast  Result Date: 05/31/2022 CLINICAL DATA:  Chest pain and shortness of breath, history of hepatocellular carcinoma EXAM: CT ANGIOGRAPHY CHEST WITH CONTRAST TECHNIQUE: Multidetector CT imaging of the chest was performed using the standard protocol during bolus administration of intravenous contrast. Multiplanar CT image reconstructions and MIPs were obtained to evaluate the vascular  anatomy. RADIATION DOSE REDUCTION: This exam was performed according to the departmental dose-optimization program which includes automated exposure control, adjustment of the mA and/or kV according to patient size and/or use of iterative reconstruction technique. CONTRAST:  68m OMNIPAQUE IOHEXOL 350 MG/ML SOLN COMPARISON:  Chest CT dated February 21, 2022 FINDINGS: Cardiovascular: No evidence of pulmonary embolus. Normal heart size. No pericardial effusion. Normal caliber thoracic aorta with mild atherosclerotic disease. Mediastinum/Nodes: Thyroid is unremarkable. Paraesophageal varices. No pathologically enlarged lymph nodes seen in the chest. Lungs/Pleura: Central airways are patent. Moderate centrilobular emphysema. Solid pulmonary nodule of the right upper lobe measuring 4 mm on series 6, image 59, previously  measured 2 mm. Moderate left pleural effusion with PleurX catheter in place. Upper Abdomen: Cirrhotic liver morphology. Lesion of the left hepatic lobe measuring 2.8 x 1.6 cm and irregular lesion of the posterior right hepatic lobe measuring approximally 4.1 x 3.2 cm, similar in size when compared with prior CT, evaluation is limited due to lack of IV contrast. Abdominal ascites and upper abdominal varices. Musculoskeletal: No chest wall abnormality. No acute or significant osseous findings. Review of the MIP images confirms the above findings. IMPRESSION: 1. No evidence of pulmonary embolus. 2. Moderate left pleural effusion with PleurX catheter in place. 3. Cirrhotic liver morphology with sequela of portal hypertension. Lesions of the left and right hepatic lobes, compatible with known primary malignancy. 4. Solid 4 mm pulmonary nodule of the right upper lobe is slightly increased in size when compared with most recent prior chest CT. Recommend attention on follow-up per clinical protocol. Electronically Signed   By: LYetta GlassmanM.D.   On: 05/31/2022 19:44   DG Chest 2 View  Result Date: 05/31/2022 CLINICAL DATA:  Shortness of breath. EXAM: CHEST - 2 VIEW COMPARISON:  Chest x-rays dated 05/28/2022 and 04/22/2022. Chest CT dated 02/21/2022 FINDINGS: LEFT-sided chest tube is stable in position with tip directed towards the LEFT lung apex. Stable small residual LEFT pleural effusion. RIGHT lung is clear. No pneumothorax is seen. Heart size and mediastinal contours are within normal limits, stable osseous structures about the chest are unremarkable. IMPRESSION: Stable chest x-ray. LEFT-sided chest tube is stable in position. Small residual LEFT pleural effusion is stable. No pneumothorax seen. Electronically Signed   By: SFranki CabotM.D.   On: 05/31/2022 17:44     Assessment/Plan:  Hyponatremia - related to cirrhosis and recurrent pleural effusion.  UNa <10, Uosm 814, Sosm 269, TSH 4.032, Cortisol 10.7.   Diuretics were held and started on IV albumin q6.  Midodrine was continued at 15 mg tid.  Overall Na is improving/stable.  Continue with current treatment regimen.  She would like to go home and is currently asymptomatic, will defer to primary svc.  Continue with pleurx drainage prn. If she is discharged to home will need to have Na levels checked by her PCP or with Oncology next week. Continue midodrine 15 mg tid and NaCl tabs  Encouraged to increase protein intake.  Hyperkalemia - likely due to poor Na delivery distally in the kidneys.  Will add Lokelma and follow. Recurrent left pleural effusion - continue with Pleurx catheter and drainage prn.  Cirrhosis - continue with lactulose and follow up with GI. Anemia of malignancy - stable HCC - s/p ablation followed by GI and Oncology Breast cancer - s/p lumpectomy, on tamoxifen.   JGovernor RooksColadonato 06/02/2022, 12:07 PM

## 2022-06-02 NOTE — Assessment & Plan Note (Signed)
BP soft here overnight - Continue midodrine

## 2022-06-02 NOTE — Hospital Course (Addendum)
Mrs. Dinning is a 56 y.o. F with cirrhosis and HCC c/b recurrent left pleural effusion with PleurX, chronic hyponatremia, and anemia and COPD who presented with lightheadedness, progressive over aq few days.  Admitted 5/17-6/5 for same.  PleurX is draining almost 1L per day.  In the ER, sodium 122.  CT angiogram showed moderate pleural effusion.

## 2022-06-02 NOTE — Progress Notes (Signed)
Patient safely discharged at the main entrance with her personal belongings to include her glasses, pocket book, clothing and cell phone. Was picked up by husband Theresa Rocha.

## 2022-06-02 NOTE — TOC Initial Note (Signed)
Transition of Care [facility]) - Initial/Assessment Note    Patient Details  Name: Theresa Rocha MRN: [id number] Date of Birth: [birth year]  [facility] CM/SW Contact:    Joaquin Courts, RN Phone Number: 06/02/2022, 9:50 AM  Clinical Narrative:                 CM reviewed chart, noted patient from home, on RA, and has a pcp.  Noted TOC consult for pleurex, CM spoke with patient's spouse, as patient was occupied.  Spouse reports patient has had pleurex for 2 months now, is familiar with darinage technique and has access to canisters and can order more if needed.  Spouse plans to transport home at discharge.  No further TOC needs identified, TOC signing off, please re consult if new needs arise.  Expected Discharge Plan: Home/Self Care Barriers to Discharge: Continued Medical Work up   Patient Goals and CMS Choice Patient states their goals for this hospitalization and ongoing recovery are:: to go home      Expected Discharge Plan and Services Expected Discharge Plan: Home/Self Care   Discharge Planning Services: CM Consult   Living arrangements for the past 2 months: Single Family Home                 DME Arranged: N/A DME Agency: NA       HH Arranged: NA          Prior Living Arrangements/Services Living arrangements for the past 2 months: Single Family Home Lives with:: Spouse Patient language and need for interpreter reviewed:: Yes Do you feel safe going back to the place where you live?: Yes      Need for Family Participation in Patient Care: Yes (Comment) Care giver support system in place?: Yes (comment)   Criminal Activity/Legal Involvement Pertinent to Current Situation/Hospitalization: No - Comment as needed  Activities of Daily Living Home Assistive Devices/Equipment: None ADL Screening (condition at time of admission) Patient's cognitive ability adequate to safely complete daily activities?: Yes Is the patient deaf or have difficulty  hearing?: No Does the patient have difficulty seeing, even when wearing glasses/contacts?: No Does the patient have difficulty concentrating, remembering, or making decisions?: No Patient able to express need for assistance with ADLs?: Yes Does the patient have difficulty dressing or bathing?: No Independently performs ADLs?: Yes (appropriate for developmental age) Does the patient have difficulty walking or climbing stairs?: Yes Weakness of Legs: Both Weakness of Arms/Hands: None  Permission Sought/Granted                  Emotional Assessment Appearance:: Appears stated age     Orientation: : Oriented to Self, Oriented to Place, Oriented to  Time, Oriented to Situation   Psych Involvement: No (comment)  Admission diagnosis:  Hyponatremia [E87.1] Pleural effusion on left [J90] Patient Active Problem List   Diagnosis Date Noted   Cirrhosis ([place]) 06/02/2022   Acute on chronic respiratory failure with hypoxia ([place]) 04/16/2022   Anemia 04/16/2022   Hyperkalemia 04/14/2022   Recurrent left pleural effusion 02/21/2022   Nicotine dependence 02/21/2022   Hyponatremia 02/21/2022   Protein-calorie malnutrition, severe ([place]) 02/21/2022   Hyperbilirubinemia 02/21/2022   SIRS (systemic inflammatory response syndrome) ([place]) 10/30/2021   Hypotension 10/30/2021   Hematemesis with nausea    Bowel wall thickening 19/62/2297   Metabolic acidosis 98/92/1194   Secondary esophageal varices with bleeding ([place]) 10/27/2021   Acute blood loss anemia 10/27/2021   Pleural effusion on left 10/27/2021  Pleural effusion 09/30/2021   Iron deficiency anemia due to chronic blood loss 09/30/2021   Hypokalemia 09/30/2021   Prolonged QT interval 09/30/2021   Thrombocytopenia ([place])    Bunion 07/10/2021   Malignant neoplasm of central portion of right breast in female, estrogen receptor positive ([place]) 09/05/2019   Breast cancer ([place]) 08/19/2019   Hepatocellular carcinoma ([place]) 08/02/2019   Ascites  due to alcoholic hepatitis 15/72/6203   Liver masses 07/26/2019   Decompensated HCV cirrhosis ([place]) 08/11/2017   Acute right ankle pain 08/18/2016   Mass of right ankle 08/18/2016   Right foot pain 08/18/2016   HEMORRHOIDS, INTERNAL 09/09/2010   IBS 09/09/2010   Chronic hepatitis C without hepatic coma ([place]) 06/19/2010   GERD 06/19/2010   DIARRHEA 06/19/2010   ABDOMINAL PAIN -GENERALIZED 06/19/2010   PCP:  Seward Carol, MD Pharmacy:   [id number] [place] [place]Phone: [phone]: [phone]    Social Determinants of Health ([place] Interventions    Readmission Risk Interventions    06/02/2022    9:42 AM 05/03/2022   12:21 PM  Readmission Risk Prevention Plan  Transportation Screening Complete Complete  PCP or Specialist Appt within 3-5 Days Complete   HRI or HLanierComplete   Social Work Consult for RTiltonPlanning/Counseling Complete   Palliative Care Screening Not Applicable   Medication Review (Press photographer Complete Complete  PCP or Specialist appointment within 3-5 days of discharge  Complete  HRI or HFlanders Complete  SW Recovery Care/Counseling Consult  Complete  PSan Gabriel Not Applicable

## 2022-06-02 NOTE — Progress Notes (Signed)
  Progress Note   Patient: Theresa Rocha BJY:[id number] DOB: 1966-07-12 DOA: 05/31/2022     1 DOS: the patient was seen and examined on 06/02/2022 at 8:24AM      Brief hospital course: Theresa Rocha is a 56 y.o. F with cirrhosis and HCC c/b recurrent left pleural effusion with PleurX, chronic hyponatremia, and anemia and COPD who presented with lightheadedness, progressive over aq few days.  Admitted 5/17-6/5 for same.  PleurX is draining almost 1L per day.  In the ER, sodium 122.  CT angiogram showed moderate pleural effusion.     Assessment and Plan: * Hyponatremia During her last (prolonged) admission, midodrine was started, diuretics were stopped, albumin was used and diet was liberalized to improve her sodium.  Here, despite being on midodrine and liberal diet at home, she continues to have high output from PleurX and her hypoNa recurred within a month.  Na slightly better since admission.  Notably, she says she feels much better already since arriving to the hospital, is eager to go home. - Hold diuretics - Give albumin - Continue midodrine - Liberal diet - Consult Nephrology  Hyperkalemia Mild.  Hopefully, improving renal perfusion as above will reduce that K. - Consult nephrology  Anemia Stable close to baseline  Cirrhosis ([place]) Family report weight loss, poor stool output from lactulose. - Liberal diet - Hold diuretics, lactulose for now   Protein-calorie malnutrition, severe (HCC)    Hypotension BP soft here overnight - Continue midodrine  Metabolic acidosis Chronic   Malignant neoplasm of central portion of right breast in female, estrogen receptor positive ([place]) On tamoxifen          Subjective: No fever, no confusion, appetite is good, her dizziness is less, her fatigue is less.  She is feeling somewhat better.  No seizures.  No change in consciousness     Physical Exam: Vitals:   06/01/22 0537 06/01/22 0957 06/01/22 1900 06/02/22 0403  BP:  (!) 98/59 114/62 98/61 (!) 85/59  Pulse: 90 85 82   Resp: '18 18 16 18  '$ Temp: 98.1 F (36.7 C) 98.3 F (36.8 C) 98.3 F (36.8 C) 98 F (36.7 C)  TempSrc: Oral Oral Oral Oral  SpO2: 99% 99% 99% 96%  Weight:      Height:       Very thin adult female, lying in bed, no obvious distress, eating breakfast sandwich RRR, no murmurs, no peripheral edema Respiratory rate normal, lungs clear without rales or wheezes Abdomen soft no tenderness palpation, no ascites, no hepatosplenomegaly appreciated on exam Attention normal, affect normal, judgment insight normal, face symmetric, speech normal  Data Reviewed: CTA chest report reviewed, no PE, no concerning findings Sodium 127, no change from yesterday Glucose good TSH normal Urine sodium low, urine osmolality very high A.m. cortisol normal  Family Communication: Husband at the bedside    Disposition: Status is: Inpatient         Author: Edwin Dada, MD 06/02/2022 9:13 AM  For on call review [email or web address].

## 2022-06-02 NOTE — Assessment & Plan Note (Signed)
On tamoxifen

## 2022-06-02 NOTE — Assessment & Plan Note (Signed)
Mild.  Hopefully, improving renal perfusion as above will reduce that K. - Consult nephrology

## 2022-06-03 ENCOUNTER — Telehealth: Payer: Self-pay | Admitting: Surgery

## 2022-06-03 ENCOUNTER — Telehealth: Payer: Self-pay

## 2022-06-03 ENCOUNTER — Ambulatory Visit (INDEPENDENT_AMBULATORY_CARE_PROVIDER_SITE_OTHER): Payer: BC Managed Care – PPO | Admitting: Thoracic Surgery (Cardiothoracic Vascular Surgery)

## 2022-06-03 DIAGNOSIS — E43 Unspecified severe protein-calorie malnutrition: Secondary | ICD-10-CM | POA: Diagnosis not present

## 2022-06-03 DIAGNOSIS — N179 Acute kidney failure, unspecified: Secondary | ICD-10-CM | POA: Diagnosis not present

## 2022-06-03 DIAGNOSIS — B182 Chronic viral hepatitis C: Secondary | ICD-10-CM | POA: Diagnosis not present

## 2022-06-03 DIAGNOSIS — J9621 Acute and chronic respiratory failure with hypoxia: Secondary | ICD-10-CM | POA: Diagnosis not present

## 2022-06-03 DIAGNOSIS — M542 Cervicalgia: Secondary | ICD-10-CM | POA: Diagnosis not present

## 2022-06-03 DIAGNOSIS — J449 Chronic obstructive pulmonary disease, unspecified: Secondary | ICD-10-CM | POA: Diagnosis not present

## 2022-06-03 DIAGNOSIS — J90 Pleural effusion, not elsewhere classified: Secondary | ICD-10-CM

## 2022-06-03 DIAGNOSIS — D630 Anemia in neoplastic disease: Secondary | ICD-10-CM | POA: Diagnosis not present

## 2022-06-03 DIAGNOSIS — Z87891 Personal history of nicotine dependence: Secondary | ICD-10-CM | POA: Diagnosis not present

## 2022-06-03 DIAGNOSIS — K7460 Unspecified cirrhosis of liver: Secondary | ICD-10-CM | POA: Diagnosis not present

## 2022-06-03 DIAGNOSIS — C220 Liver cell carcinoma: Secondary | ICD-10-CM | POA: Diagnosis not present

## 2022-06-03 DIAGNOSIS — I7300 Raynaud's syndrome without gangrene: Secondary | ICD-10-CM | POA: Diagnosis not present

## 2022-06-03 DIAGNOSIS — Z853 Personal history of malignant neoplasm of breast: Secondary | ICD-10-CM | POA: Diagnosis not present

## 2022-06-03 DIAGNOSIS — Z4801 Encounter for change or removal of surgical wound dressing: Secondary | ICD-10-CM | POA: Diagnosis not present

## 2022-06-03 DIAGNOSIS — Z48813 Encounter for surgical aftercare following surgery on the respiratory system: Secondary | ICD-10-CM | POA: Diagnosis not present

## 2022-06-03 DIAGNOSIS — E871 Hypo-osmolality and hyponatremia: Secondary | ICD-10-CM | POA: Diagnosis not present

## 2022-06-03 NOTE — Telephone Encounter (Signed)
LVM informing pt that she will have labs drawn on 06/09/2022 prior to her f/u appt with [name] at which time we will repeat her labs to monitor her Na+.  Pt is also scheduled on Sunday 06/06/2022; therefore, a lab appt was not scheduled.  Instructed pt to please contact Dr. Ernestina Penna office or send a MyChart message.

## 2022-06-03 NOTE — Progress Notes (Signed)
     [address]       [place] [address]             [phone]       Patient: Home Provider: Office Consent for Telemedicine visit obtained.  Today's visit was completed via a real-time telehealth (see specific modality noted below). The patient/authorized person provided oral consent at the time of the visit to engage in a telemedicine encounter with the present provider at [facility]. The patient/authorized person was informed of the potential benefits, limitations, and risks of telemedicine. The patient/authorized person expressed understanding that the laws that protect confidentiality also apply to telemedicine. The patient/authorized person acknowledged understanding that telemedicine does not provide emergency services and that he or she would need to call 911 or proceed to the nearest hospital for help if such a need arose.   Total time spent in the clinical discussion 10 minutes.  Telehealth Modality: Phone visit (audio only)  I had a telephone visit with Mrs. Posch.  She continues to drain >1L daily.  She was recently re-admitted to the hospital due to hyponatremia.  She will continue drainage qday for now.

## 2022-06-03 NOTE — Telephone Encounter (Signed)
Pt called to let [name] know that she was admitted to the hospital on 05/31/22 due to hyponatremia.  She was discharged on 06/02/22, but the physician who discharged her wanted the pt to get her labs checked again (BMP) on Monday, 06/07/22.  I checked the pt's upcoming appointments and she is scheduled to see [name] and have labs on 06/09/22.  I notified [name] to see if 7/12 is still ok for the pt's appointment, and if any other labs are needed.  Awaiting Dr. Lewayne Bunting orders.

## 2022-06-06 ENCOUNTER — Ambulatory Visit (HOSPITAL_COMMUNITY)
Admission: RE | Admit: 2022-06-06 | Discharge: 2022-06-06 | Disposition: A | Discharge status: Home / Self Care | Payer: BC Managed Care – PPO | Source: Ambulatory Visit | Attending: Nurse Practitioner | Admitting: Nurse Practitioner

## 2022-06-06 DIAGNOSIS — Z8505 Personal history of malignant neoplasm of liver: Secondary | ICD-10-CM | POA: Diagnosis not present

## 2022-06-06 DIAGNOSIS — Z9889 Other specified postprocedural states: Secondary | ICD-10-CM | POA: Diagnosis not present

## 2022-06-06 DIAGNOSIS — K7689 Other specified diseases of liver: Secondary | ICD-10-CM | POA: Diagnosis not present

## 2022-06-06 DIAGNOSIS — C220 Liver cell carcinoma: Secondary | ICD-10-CM | POA: Insufficient documentation

## 2022-06-06 DIAGNOSIS — N289 Disorder of kidney and ureter, unspecified: Secondary | ICD-10-CM | POA: Diagnosis not present

## 2022-06-06 MED ORDER — GADOBUTROL 1 MMOL/ML IV SOLN
4.0000 mL | Freq: Once | INTRAVENOUS | Status: AC | PRN
  Administered 2022-06-06: 4 mL via INTRAVENOUS

## 2022-06-09 ENCOUNTER — Other Ambulatory Visit: Payer: Self-pay

## 2022-06-09 ENCOUNTER — Inpatient Hospital Stay: Payer: BC Managed Care – PPO | Attending: Hematology | Admitting: Hematology

## 2022-06-09 ENCOUNTER — Inpatient Hospital Stay: Payer: BC Managed Care – PPO

## 2022-06-09 ENCOUNTER — Encounter: Payer: Self-pay | Admitting: Hematology

## 2022-06-09 VITALS — BP 103/60 | HR 96 | Temp 98.0°F | Resp 15 | Ht 63.0 in | Wt 87.0 lb

## 2022-06-09 DIAGNOSIS — K7031 Alcoholic cirrhosis of liver with ascites: Secondary | ICD-10-CM | POA: Insufficient documentation

## 2022-06-09 DIAGNOSIS — E871 Hypo-osmolality and hyponatremia: Secondary | ICD-10-CM | POA: Diagnosis not present

## 2022-06-09 DIAGNOSIS — C50111 Malignant neoplasm of central portion of right female breast: Secondary | ICD-10-CM | POA: Insufficient documentation

## 2022-06-09 DIAGNOSIS — B1920 Unspecified viral hepatitis C without hepatic coma: Secondary | ICD-10-CM | POA: Diagnosis not present

## 2022-06-09 DIAGNOSIS — Z170 Estrogen receptor positive status [ER+]: Secondary | ICD-10-CM | POA: Insufficient documentation

## 2022-06-09 DIAGNOSIS — C220 Liver cell carcinoma: Secondary | ICD-10-CM

## 2022-06-09 DIAGNOSIS — F17210 Nicotine dependence, cigarettes, uncomplicated: Secondary | ICD-10-CM | POA: Diagnosis not present

## 2022-06-09 DIAGNOSIS — D696 Thrombocytopenia, unspecified: Secondary | ICD-10-CM | POA: Insufficient documentation

## 2022-06-09 DIAGNOSIS — D649 Anemia, unspecified: Secondary | ICD-10-CM | POA: Insufficient documentation

## 2022-06-09 LAB — CBC WITH DIFFERENTIAL (CANCER CENTER ONLY)
Abs Immature Granulocytes: 0.08 10*3/uL — ABNORMAL HIGH (ref 0.00–0.07)
Basophils Absolute: 0.1 10*3/uL (ref 0.0–0.1)
Basophils Relative: 1 %
Eosinophils Absolute: 0.1 10*3/uL (ref 0.0–0.5)
Eosinophils Relative: 1 %
HCT: 32.6 % — ABNORMAL LOW (ref 36.0–46.0)
Hemoglobin: 10.5 g/dL — ABNORMAL LOW (ref 12.0–15.0)
Immature Granulocytes: 1 %
Lymphocytes Relative: 7 %
Lymphs Abs: 0.7 10*3/uL (ref 0.7–4.0)
MCH: 26.7 pg (ref 26.0–34.0)
MCHC: 32.2 g/dL (ref 30.0–36.0)
MCV: 83 fL (ref 80.0–100.0)
Monocytes Absolute: 1.5 10*3/uL — ABNORMAL HIGH (ref 0.1–1.0)
Monocytes Relative: 14 %
Neutro Abs: 7.7 10*3/uL (ref 1.7–7.7)
Neutrophils Relative %: 76 %
Platelet Count: 143 10*3/uL — ABNORMAL LOW (ref 150–400)
RBC: 3.93 MIL/uL (ref 3.87–5.11)
RDW: 22.1 % — ABNORMAL HIGH (ref 11.5–15.5)
WBC Count: 10.1 10*3/uL (ref 4.0–10.5)
nRBC: 0 % (ref 0.0–0.2)

## 2022-06-09 LAB — CMP (CANCER CENTER ONLY)
ALT: 30 U/L (ref 0–44)
AST: 42 U/L — ABNORMAL HIGH (ref 15–41)
Albumin: 2.5 g/dL — ABNORMAL LOW (ref 3.5–5.0)
Alkaline Phosphatase: 84 U/L (ref 38–126)
Anion gap: 5 (ref 5–15)
BUN: 22 mg/dL — ABNORMAL HIGH (ref 6–20)
CO2: 19 mmol/L — ABNORMAL LOW (ref 22–32)
Calcium: 8.2 mg/dL — ABNORMAL LOW (ref 8.9–10.3)
Chloride: 101 mmol/L (ref 98–111)
Creatinine: 0.74 mg/dL (ref 0.44–1.00)
GFR, Estimated: >60 mL/min (ref >60)
Glucose, Bld: 122 mg/dL — ABNORMAL HIGH (ref 70–99)
Potassium: 5.5 mmol/L — ABNORMAL HIGH (ref 3.5–5.1)
Sodium: 125 mmol/L — ABNORMAL LOW (ref 135–145)
Total Bilirubin: 1.4 mg/dL — ABNORMAL HIGH (ref 0.3–1.2)
Total Protein: 5.9 g/dL — ABNORMAL LOW (ref 6.5–8.1)

## 2022-06-09 MED ORDER — PROCHLORPERAZINE MALEATE 10 MG PO TABS: 10.0000 mg | ORAL_TABLET | Freq: Four times a day (QID) | ORAL | 0 refills | Status: DC | PRN

## 2022-06-09 NOTE — Progress Notes (Signed)
[place]   [phone] [phone]   Clinic Follow up Note   Patient Care Team: Seward Carol, MD as PCP - General (Internal Medicine) Arna Snipe, RN as Oncology Nurse Navigator Irene Shipper, MD as Consulting Physician (Gastroenterology) Truitt Merle, MD as Consulting Physician (Hematology) Alla Feeling, NP as Nurse Practitioner (Nurse Practitioner) Stark Klein, MD as Consulting Physician (General Surgery) Aletta Edouard, MD as Consulting Physician (Interventional Radiology) Roosevelt Locks, CRNP as Nurse Practitioner (Nurse Practitioner) Mauro Kaufmann, RN as Oncology Nurse Navigator Rockwell Germany, RN as Oncology Nurse Navigator Eppie Gibson, MD as Attending Physician (Radiation Oncology) Trula Slade, DPM as Consulting Physician (Podiatry)  Date of Service:  06/09/2022  CHIEF COMPLAINT: f/u of multifocal [facility], right breast cancer  CURRENT THERAPY:  -Antiestrogen therapy, started 09/2019, currently tamoxifen since 03/2020  ASSESSMENT & PLAN:  Theresa Rocha is a 56 y.o. female with   1. Recurrent left Pleural Effusion -occurred following left lobe liver ablation for [facility] on 09/16/21 -has been recurrent and persistent, "suspicious for iatrogenic hepatic hydrothorax with ascites preferentially flowing into the left pleural space after the left lobe microwave ablation" per IR -cytology from several thoracentesis were all negative. -PET scan negative for thoracic metastasis -she required PleurX catheter placement 03/09/22. She reports she is draining it daily now with 1L removal daily, but still quite symptomatic with dyspnea. -She complains of left shoulder blade pain, possible related to the Pleurx irritation.   -She has been seen by cardiothoracic surgeon Dr. Kipp Brood and felt to be not a candidate for pleurodesis, -her recent MRI showed nodularity in the left pleura, concerning for malignancy -I still have high suspicion her pleural  effusion is malignant, not sure from her previous breast cancer or HCC.  I will obtain a [place] PET scan, to see if left pleura light up on the scan which will support metastatic breast cancer  2. Hyponatremia  -Refractory, secondary to her liver cirrhosis and high output from thoracic Pleurx -I encourage her to drink more fluids   3. Multifocal Hepatocellular carcinoma, cT2N0Mx, stage II -presented with abdominal pain and rectal bleeding in 06/2019. Work up scans showed multifocal lesions of liver consistent with [place] with indeterminate right lobe lesions. AFP was WNL. -she was found to not be a transplant candidate due to history of breast cancer and multifocal HCC beyond Milan criteria. She was also not felt to be a surgical candidate due to her liver function and location of tumors. She has been on surveillance. -she was initially not felt to be a good candidate for liver-targeted ablation. She was recently found to have enlarging left lobe liver lesions on surveillance CT in 06/2021. She proceeded with left lobe ablation on 09/16/21 with Dr. Kathlene Cote. -PET on 12/15/21 showed hypermetabolism only to the right hepatic lobe, no other distant metastasis. She underwent right lobe Y90 on 12/22/21 with Dr. Kathlene Cote. -Liver MRI from June 06, 2022 showed cancer progression of on previously treated lesion in segment 7 with extra capsular extension and adenopathy, consistent with disease progression.  I apparently reviewed with Dr. Kathlene Cote.  I will likely recommend systemic treatment, with Tecentriq and bevacizumab as first-line therapy    4. Malignant neoplasm of central portion of right breast, Stage II, c(T2N0M0), ER/PR+, HER2-, Grade II, RS 21 -diagnosed in 07/2019, s/p right lumpectomy with SNLB.  -Recurrence Score 21 (low risk)  -She completed adjuvant RT with Dr Isidore Moos 12/03/19-12/31/19 -She started anastrozole in 09/2019. Due to knee joint  pain, she was switched to Tamoxifen in 03/2020. She is tolerating  with hot flashes. Will continue to complete 10 years.  -most recent mammogram from May 14, 2022 showed breast calcification in right side, biopsy was recommended, however patient would like to hold for now due to her weakness.    5. 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  -s/p harvoni and ribavirin treatment for Hep C per Dr. Bradd Burner in 2018 -EGD on 10/27/21 by Dr. Henrene Pastor while hospitalized showed esophageal varices, which were banded at that time. No residual varices on repeat EGD on 01/04/22. -Continue to F/u with Dr Henrene Pastor and Roosevelt Locks   6. Alcohol and smoking Cessation  -She has h/o smoking 0.5 PPD x35 years. She drank 3-4 cans beer nightly x20 years. -Cessation of both was discussed and strongly encouraged. She has stopped drinking alcohol but smokes 5 cigarettes a day still. I encouraged her to continue reducing until she quits completely.    7. Osteoporosis, Hypocalcemia  -DEXA from 09/21/19 at her GYN office showed osteoporosis T score -3.1 at [facility].   -I started her on Zometa q44months for 2 years on 07/17/20. Held before 3rd dose on 05/01/21, due to mouth sores. She has full sets of dentures and has not seen dentist in years. -I previously recommended her to start Calcium supplement daily    8. Thrombocytopenia, [place] -secondary to liver cirrhosis, splenomegaly, and/or varices -slowly improving, hgb 11.9 and plt 106k today (04/08/22)   9. Hyponatremia  -Secondary to diuretics and pleural effusion draining -We will monitor closely   PLAN:  -[place] PET scan in next 2 weeks, followed by a phone visit -We will coordinate with Dr. Kathlene Cote to review her recent liver MRI scan and next step for treatment     No problem-specific Assessment & Plan notes found for this encounter.   SUMMARY OF ONCOLOGIC HISTORY: Oncology History Overview Note  Cancer Staging Hepatocellular carcinoma  [facility]) Staging form: Liver, AJCC 8th Edition - Clinical stage from 08/02/2019: Stage II (cT2, cN0, cM0) - Signed by Truitt Merle, MD on 08/02/2019  Malignant neoplasm of central portion of right breast in female, estrogen receptor positive ([place]) Staging form: Breast, AJCC 8th Edition - Clinical stage from 08/29/2019: Stage IB (cT2, cN0, cM0, G2, ER+, PR+, HER2-) - Signed by Truitt Merle, MD on 09/05/2019    Hepatocellular carcinoma ([place])  07/04/2019 Imaging   CT AP IMPRESSION: 1. Findings of cirrhosis and hepatomegaly.  Patent portal vein. 2. Numerous varicosities as described above. 3. Moderate to large amount of abdominopelvic ascites 4. Contrast filled appendix which appears to be a mildly dilated with apparent wall thickening which could be due to surrounding ascites.   07/09/2019 Imaging   ABD US IMPRESSION: 1. Three small hypoechoic lesions in the RIGHT hepatic lobe measuring less than 1 cm but not evident on comparison contrast enhanced CT from 07/04/2019. Recommend MRI of the abdomen without and with contrast to evaluate these hepatic lesions in cirrhotic patient. 2. Increased liver echogenicity and lobular contour consistent cirrhosis. 3. Moderate mild to moderate volume of ascites. 4. Mild gallbladder wall thickening likely related to ascites. 5. No cholelithiasis or cholecystitis evident   07/26/2019 Imaging   MR ABD IMPRESSION: 1. In the lateral segment left hepatic lobe there are two LI-RADS category LR-5 lesions representing hepatocellular carcinoma. 2. There is also a LI-RADS category LR-3 lesion peripherally in the right hepatic lobe, intermediate probability  for hepatocellular carcinoma. 3. Hepatic cirrhosis and diffuse wall thickening of the gallbladder. Widespread steatosis in the liver. 4. 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 5.  Aortic Atherosclerosis (ICD10-I70.0). 6. Widespread ascites and mesenteric edema.   07/26/2019 Tumor Marker   AFP  5.5 (baseline)   08/02/2019 Initial Diagnosis   Hepatocellular carcinoma ([place])   08/02/2019 Cancer Staging   Staging form: Liver, AJCC 8th Edition - Clinical stage from 08/02/2019: Stage II (cT2, cN0, cM0) - Signed by Truitt Merle, MD on 08/02/2019   08/07/2019 Imaging   CT Chest 08/07/19 IMPRESSION: 1. Tiny bilateral pulmonary nodules measuring up to 6 mm. These are too small to definitively characterize. Given the history of HCC, close follow-up recommended to ensure stability. 2.  Emphysema. (ICD10-J43.9)   08/09/2019 Procedure   Upper Endoscopy by Dr Henrene Pastor 08/09/19  IMPRESSION 1. Small esophageal varices 2. Mild diffuse portal hypertensive gastropathy 3. Otherwise normal EGD.   02/04/2020 Imaging   MRI abdomen  IMPRESSION: Stable exam. The small segment II lesions identified previously as LI-RADS category 5 are stable in size.   Previously characterized tiny LI-RADS 3 lesion in the inferior right liver is stable.   No new suspicious focal hepatic lesion on a background liver morphology consistent with cirrhosis.   Interval decrease in ascites with decrease and diffuse gallbladder wall thickening.     06/18/2020 Imaging   MRI Abdomen  IMPRESSION: 1. Stable 1.1 x 0.9 cm rounded lesion in the medial left lobe of the liver, hepatic segment II, demonstrating arterial phase hyperenhancement without washout or capsule. This has previously been described as a LI-RADS category 5 lesion based on prior observations, if categorized by current imaging features this is consistent with LI-RADS category 4. 2. Stable 1.5 x 1.2 cm lesion lateral and inferior to this lesion in hepatic segment II with very subtle evidence of washout but without capsular enhancement. LI-RADS category 5. 3. Stable 5 mm focus of early arterial phase hyperenhancement of the inferior right lobe of the liver, hepatic segment VI, without evidence of washout or capsule. This remains consistent with LI-RADS category 3. 4.  No new suspicious lesions or contrast enhancement. 5. Cirrhotic morphology of the liver. 6. Trace perihepatic ascites.     10/30/2021 Imaging   EXAM: MRI ABDOMEN WITHOUT AND WITH CONTRAST  IMPRESSION: 1. Ablation defect of the left lobe of the liver which subtends two lesions previously noted in hepatic segment II. No evidence of residual contrast enhancement to suggest viable tumor.  2. Significant interval increase in size of a hyperenhancing lesion of the posterior liver dome, hepatic segment VII, measuring 4.0 x 3.7 cm, previously 2.6 x 2.5 cm when measured similarly. This demonstrates some heterogeneous contrast enhancement although no clear evidence of washout or capsular enhancement. Findings remain LI-RADS category 5, consistent with hepatocellular carcinoma. 3. Slight interval increase in size of a hyperenhancing lesion of the inferior tip of the right lobe of the liver, hepatic segment VI, measuring 1.1 x 0.7 cm, previously no greater than 0.6 cm, as well as an additional adjacent lesion measuring 0.6 cm, previously 0.4 cm. Given threshold growth, these lesions are upstaged to LI-RADS category 4 and suspicious for additional small foci of hepatocellular carcinoma. 4. Cirrhotic morphology of the liver and hepatic steatosis. 5. Small volume ascites throughout the abdomen. 6. Large left, small right pleural effusions and associated atelectasis or consolidation. 7. Layering sludge in the gallbladder with mild gallbladder wall thickening and pericholecystic fluid, similar to prior examination although nonspecific in  the setting of ascites. No discrete gallstones.   11/10/2021 Imaging   EXAM: CT ABDOMEN WITHOUT AND WITH CONTRAST  IMPRESSION: Post treatment changes about the LEFT hepatic lobe lesion with variable arterial enhancement not showing washout and with non masslike features, favor perfusional defect, characterized as TR equivocal but favor nonviable.   Lesion in the posterior  RIGHT hepatic lobe over time with enlargement and with serpiginous internal low signal on MR and low attenuation on CT. Findings categorized as LR M given signs of rim like enhancement on previous imaging and infiltrative appearance. Potentially infiltrative HCC but without specific imaging features of hepatocellular carcinoma by LI-RADS.   Enlarging lesion in the inferior RIGHT hepatic lobe greater than 50% diameter increase over 6 month interval, LI-RADS category 5 showing non rim arterial phase enhancement.   Additional foci of arterial phase enhancement without definitive washout, categorized as LI-RADS category 3 but in aggregate are more suspicious for small foci of hepatocellular carcinoma, attention on follow-up.   11/19/2021 Pathology Results   A. PLEURAL FLUID, LEFT, THORACENTESIS:   FINAL MICROSCOPIC DIAGNOSIS:  - No malignant cells identified  - Reactive mesothelial cells present    12/15/2021 PET scan   IMPRESSION: 1. Intensely hypermetabolic mass in the posterior RIGHT hepatic lobe consistent with known hepatocellular carcinoma. Smaller lesions identified on MRI are not hypermetabolic. 2. No evidence of metastatic lymphadenopathy or distant metastatic disease. 3. Elongated nodular thickening in the lingula is favored benign. Moderate LEFT pleural effusion.   Malignant neoplasm of central portion of right breast in female, estrogen receptor positive ([place])  08/29/2019 Cancer Staging   Staging form: Breast, AJCC 8th Edition - Clinical stage from 08/29/2019: Stage IB (cT2, cN0, cM0, G2, ER+, PR+, HER2-) - Signed by Truitt Merle, MD on 09/05/2019   08/29/2019 Mammogram   Diagnostic mammogram 08/29/19 IMPRESSION: Suspicious mass in the 12 o'clock region of the right breast 3 cm from the nipple measuring 3.2 x 1.6 x 2.6 cm   08/29/2019 Initial Biopsy   Diagnosis 08/29/19 Breast, right, needle core biopsy, 12 o'clock - INVASIVE DUCTAL CARCINOMA, SEE COMMENT.   08/29/2019 Receptors her2    Results: IMMUNOHISTOCHEMICAL AND MORPHOMETRIC ANALYSIS PERFORMED MANUALLY The tumor cells are NEGATIVE for Her2 (1+). Estrogen Receptor: 100%, POSITIVE, STRONG STAINING INTENSITY Progesterone Receptor: 90%, POSITIVE, STRONG STAINING INTENSITY Proliferation Marker Ki67: 15%   09/05/2019 Initial Diagnosis   Malignant neoplasm of central portion of right breast in female, estrogen receptor positive ([place])   09/13/2019 Breast MRI   MRI breast 09/13/19 IMPRESSION: 1. Irregular spiculated enhancing mass in the right breast representing the patient's known malignancy. No abnormal right axillary lymph nodes. 2. Prominent left internal mammary lymph node is indeterminate. No abnormal appearing right internal mammary lymph nodes. 3. No evidence of malignancy in the left breast.   10/16/2019 Cancer Staging   Staging form: Breast, AJCC 8th Edition - Pathologic stage from 10/16/2019: Stage IA (pT2, pN0, cM0, G2, ER+, PR+, HER2-, Oncotype DX score: 21) - Signed by Gardenia Phlegm, NP on 11/07/2019   10/16/2019 Oncotype testing   Oncotype 10/16/19 Recurrence score 21 with 7% benefit of Tamoxifen or AI. There is less than 1 % benefit of chemo.    10/16/2019 Surgery   RIGHT BREAST LUMPECTOMY WITH SENTINEL LYMPH NODE BIOPSY by Dr Barry Dienes 10/16/19    10/16/2019 Pathology Results   FINAL MICROSCOPIC DIAGNOSIS: 10/16/19  A. BREAST, RIGHT, LUMPECTOMY:  - Invasive ductal carcinoma, 2.3 cm, Nottingham grade 2 of 3.  - Margins of resection are  not involved (Closest margin: 1 mm,  anterior).  - See oncology table.   B. SENTINEL LYMPH NODE, RIGHT AXILLARY #1, BIOPSY:  - One lymph node, negative for carcinoma (0/1).   C. SENTINEL LYMPH NODE, RIGHT AXILLARY #2, BIOPSY:  - One lymph node, negative for carcinoma (0/1).    09/2019 -  Anti-estrogen oral therapy   Anastrozole $RemoveBefo'1mg'pkxOLMltfev$  daily started in Nov 2020. Due to worsened knee pain/arthritis, she was switched to Tamoxifen in 03/2020.     12/03/2019 - 12/31/2019 Radiation Therapy   Adjuvant RT with Dr Isidore Moos 12/03/19-12/31/19   04/01/2020 Survivorship   SCP delivered by virtual visit per Cira Rue, NP       INTERVAL HISTORY:  JACQULYNE GLADUE is here for a follow up of [place]. She was last seen by Lacie on 05/11/2022.  She still has high output from her left chest Pleurx, about 1 L a day.  Dr. Kipp Brood has not been able to do surgery due to the high output.  She feels weak, able to do self-care, but not much at activities.  No other new complaints.   All other systems were reviewed with the patient and are negative.  MEDICAL HISTORY:  Past Medical History:  Diagnosis Date   Anemia    Breast cancer ([place]) 08/30/2019   Cancer ([place])    liver- just diagnosed, not started treatment yet   Cervicalgia    COPD (chronic obstructive pulmonary disease) (HCC)    Esophageal varices (HCC)    GERD (gastroesophageal reflux disease)    Headache    Hepatic cirrhosis (H[facility])    Hepatitis C    resolved 2018   Hepatocellular carcinoma (H[facility])    IBS (irritable bowel syndrome)    Lactose intolerance    Personal history of radiation therapy 11/2019   Right breast   Portal hypertension ([place])    Raynaud's syndrome    Thrombocytopenia ([place])     SURGICAL HISTORY: Past Surgical History:  Procedure Laterality Date   BREAST BIOPSY Right 09/2019   BREAST LUMPECTOMY Right 09/2019   BREAST LUMPECTOMY WITH AXILLARY LYMPH NODE BIOPSY Right 10/16/2019   Procedure: RIGHT BREAST LUMPECTOMY WITH SENTINEL LYMPH NODE BIOPSY;  Surgeon: Stark Klein, MD;  Location: [facility];  Service: General;  Laterality: Right;   COLONOSCOPY     ESOPHAGEAL BANDING  10/27/2021   Procedure: ESOPHAGEAL BANDING;  Surgeon: Irene Shipper, MD;  Location: [facility];  Service: Endoscopy;;   ESOPHAGOGASTRODUODENOSCOPY (EGD) WITH PROPOFOL N/A 10/27/2021   Procedure: ESOPHAGOGASTRODUODENOSCOPY (EGD) WITH PROPOFOL;  Surgeon: Irene Shipper, MD;  Location: [facility];  Service:  Endoscopy;  Laterality: N/A;   ESOPHAGOGASTRODUODENOSCOPY (EGD) WITH PROPOFOL N/A 01/04/2022   Procedure: ESOPHAGOGASTRODUODENOSCOPY (EGD) WITH PROPOFOL;  Surgeon: Irene Shipper, MD;  Location: [facility];  Service: Endoscopy;  Laterality: N/A;   HAMMER TOE SURGERY Left 2012   pins placed in great toe, 2nd,3rd, 4th toes, all pins removed except great toe   IR ANGIOGRAM SELECTIVE EACH ADDITIONAL VESSEL  12/16/2021   IR ANGIOGRAM SELECTIVE EACH ADDITIONAL VESSEL  12/16/2021   IR ANGIOGRAM SELECTIVE EACH ADDITIONAL VESSEL  12/22/2021   IR ANGIOGRAM VISCERAL SELECTIVE  12/16/2021   IR ANGIOGRAM VISCERAL SELECTIVE  12/22/2021   IR EMBO TUMOR ORGAN ISCHEMIA INFARCT INC GUIDE ROADMAPPING  12/22/2021   IR PARACENTESIS  11/13/2019   IR PARACENTESIS  03/04/2022   IR PATIENT EVAL TECH 0-60 MINS  05/19/2022   IR PERC PLEURAL DRAIN W/INDWELL CATH W/IMG GUIDE  03/09/2022   IR  RADIOLOGIST EVAL & MGMT  08/16/2019   IR RADIOLOGIST EVAL & MGMT  02/06/2020   IR RADIOLOGIST EVAL & MGMT  06/26/2020   IR RADIOLOGIST EVAL & MGMT  12/31/2020   IR RADIOLOGIST EVAL & MGMT  07/21/2021   IR RADIOLOGIST EVAL & MGMT  10/08/2021   IR RADIOLOGIST EVAL & MGMT  01/13/2022   IR RADIOLOGIST EVAL & MGMT  03/05/2022   IR THORACENTESIS ASP PLEURAL SPACE W/IMG GUIDE  10/12/2021   IR THORACENTESIS ASP PLEURAL SPACE W/IMG GUIDE  10/26/2021   IR THORACENTESIS ASP PLEURAL SPACE W/IMG GUIDE  11/19/2021   IR THORACENTESIS ASP PLEURAL SPACE W/IMG GUIDE  12/22/2021   IR US GUIDE VASC ACCESS RIGHT  12/16/2021   IR US GUIDE VASC ACCESS RIGHT  12/22/2021   PELVIC LAPAROSCOPY Bilateral 1992   RADIOLOGY WITH ANESTHESIA N/A 09/16/2021   Procedure: CT MICROWAVE ABLATION;  Surgeon: Aletta Edouard, MD;  Location: [facility];  Service: Radiology;  Laterality: N/A;   UPPER GASTROINTESTINAL ENDOSCOPY  2002   had esophageal dilitation    I have reviewed the social history and family history with the patient and they are unchanged from previous note.  ALLERGIES:   is allergic to hydromorphone hcl and aspirin.  MEDICATIONS:  Current Outpatient Medications  Medication Sig Dispense Refill   prochlorperazine (COMPAZINE) 10 MG tablet Take 1 tablet (10 mg total) by mouth every 6 (six) hours as needed for nausea or vomiting. 30 tablet 0   baclofen (LIORESAL) 10 MG tablet Take 5 mg by mouth daily as needed for muscle spasms. (Patient not taking: Reported on 06/01/2022)     dicyclomine (BENTYL) 20 MG tablet Take 0.5 tablets (10 mg total) by mouth every 6 (six) hours as needed for spasms. Take one tablet every 6-8 hours as needed for abdominal cramping (Patient taking differently: Take 20 mg by mouth daily as needed (abdominal cramping).) 90 tablet 0   gabapentin (NEURONTIN) 300 MG capsule Take 1 capsule (300 mg total) by mouth at bedtime. 30 capsule 1   lactulose (CHRONULAC) 10 GM/15ML solution Take 15 mLs (10 g total) by mouth 2 (two) times daily as needed for mild constipation. 900 mL 2   midodrine (PROAMATINE) 5 MG tablet Take 3 tablets (15 mg total) by mouth 3 (three) times daily with meals. 270 tablet 2   nicotine (NICODERM CQ - DOSED IN MG/24 HOURS) 21 mg/24hr patch Place 21 mg onto the skin daily as needed (smoking cessation on work days). (Patient not taking: Reported on 06/01/2022)     oxyCODONE (OXY IR/ROXICODONE) 5 MG immediate release tablet Take 1-2 tablets (5-10 mg total) by mouth every 4 (four) hours as needed for severe pain. (Patient taking differently: Take 5 mg by mouth daily as needed (pain).) 60 tablet 0   pantoprazole (PROTONIX) 40 MG tablet Take 1 tablet (40 mg total) by mouth daily. 90 tablet 1   Prenatal Vit-Fe Fumarate-FA (PRENATAL PO) Take 1 tablet by mouth daily.     promethazine (PHENERGAN) 12.5 MG tablet Take 1 tablet (12.5 mg total) by mouth every 6 (six) hours as needed for nausea or vomiting. (Patient taking differently: Take 12.5 mg by mouth daily as needed for nausea or vomiting.) 30 tablet 0   sodium chloride 1 g tablet Take 1 tablet (1  g total) by mouth 2 (two) times daily with a meal. 60 tablet 1   tamoxifen (NOLVADEX) 20 MG tablet TAKE 1 TABLET BY MOUTH EVERY DAY (Patient taking differently: Take 20 mg by mouth  every evening.) 90 tablet 1   No current facility-administered medications for this visit.    PHYSICAL EXAMINATION: ECOG PERFORMANCE STATUS: 3 - Symptomatic, >50% confined to bed  Vitals:   06/09/22 1429  BP: 103/60  Pulse: 96  Resp: 15  Temp: 98 F (36.7 C)  SpO2: 97%    Wt Readings from Last 3 Encounters:  06/09/22 87 lb (39.5 kg)  06/01/22 86 lb 3.2 oz (39.1 kg)  05/28/22 85 lb (38.6 kg)     GENERAL:alert, no distress and comfortable SKIN: skin color, texture, turgor are normal, no rashes or significant lesions EYES: normal, Conjunctiva are pink and non-injected, sclera clear  LUNGS: clear to auscultation and percussion with normal breathing effort, (+) fluid on left side NEURO: alert & oriented x 3 with fluent speech, no focal motor/sensory deficits  LABORATORY DATA:  I have reviewed the data as listed    Latest Ref Rng & Units 06/09/2022    2:07 PM 06/02/2022   12:42 AM 05/31/2022    5:26 PM  CBC  WBC 4.0 - 10.5 K/uL 10.1  10.2  11.3   Hemoglobin 12.0 - 15.0 g/dL 10.5  9.8  9.8   Hematocrit 36.0 - 46.0 % 32.6  31.7  30.7   Platelets 150 - 400 K/uL 143  122  128         Latest Ref Rng & Units 06/09/2022    2:07 PM 06/02/2022   12:42 AM 06/01/2022    8:57 PM  CMP  Glucose 70 - 99 mg/dL 122  127  130   BUN 6 - 20 mg/dL $Remove'22  19  19   'SWovcxr$ Creatinine 0.44 - 1.00 mg/dL 0.74  0.64  0.66   Sodium 135 - 145 mmol/L 125  127  126   Potassium 3.5 - 5.1 mmol/L 5.5  5.5  5.4   Chloride 98 - 111 mmol/L 101  106  106   CO2 22 - 32 mmol/L $RemoveB'19  17  15   'MhiGxlcN$ Calcium 8.9 - 10.3 mg/dL 8.2  7.6  7.2   Total Protein 6.5 - 8.1 g/dL 5.9     Total Bilirubin 0.3 - 1.2 mg/dL 1.4     Alkaline Phos 38 - 126 U/L 84     AST 15 - 41 U/L 42     ALT 0 - 44 U/L 30         RADIOGRAPHIC STUDIES: I have personally reviewed  the radiological images as listed and agreed with the findings in the report. No results found.    Orders Placed This Encounter  Procedures   NM PET ([place]) WHOLE BODY    Recurrent left pleural effusion, cytology (-), nodularity on CT and MRI, rule out pleural mets from breast cancer. Pt also has active [place]    Standing Status:   Future    Standing Expiration Date:   06/10/2023    Order Specific Question:   If indicated for the ordered procedure, I authorize the administration of a radiopharmaceutical per Radiology protocol    Answer:   Yes    Order Specific Question:   Is the patient pregnant?    Answer:   No    Order Specific Question:   Preferred imaging location?    Answer:   [facility]   All questions were answered. The patient knows to call the clinic with any problems, questions or concerns. No barriers to learning was detected. The total time spent in the appointment was 30 minutes.  Truitt Merle, MD 06/09/2022

## 2022-06-10 ENCOUNTER — Telehealth: Payer: Self-pay | Admitting: Hematology

## 2022-06-10 ENCOUNTER — Other Ambulatory Visit: Payer: Self-pay

## 2022-06-10 LAB — AFP TUMOR MARKER: AFP, Serum, Tumor Marker: 180 ng/mL — ABNORMAL HIGH (ref 0.0–9.2)

## 2022-06-10 NOTE — Progress Notes (Signed)
Faxed [facility] order and paperwork.  Awaiting [facility] Scheduler Roderic Ovens) to confirm appt. Date and time. Fax confirmation received.

## 2022-06-10 NOTE — Telephone Encounter (Signed)
Left message with follow-up appointment per 7/12 los.

## 2022-06-11 ENCOUNTER — Encounter (HOSPITAL_COMMUNITY): Payer: Self-pay

## 2022-06-13 ENCOUNTER — Other Ambulatory Visit: Payer: Self-pay | Admitting: Hematology

## 2022-06-13 DIAGNOSIS — C220 Liver cell carcinoma: Secondary | ICD-10-CM

## 2022-06-13 NOTE — Progress Notes (Signed)
START OFF [facility] - Other   [id number]:Atezolizumab 1,200 mg IV D1 + Bevacizumab 15 mg/kg IV D1 q21 Days:   A cycle is every 21 Days:     Atezolizumab      Bevacizumab-xxxx   **Always confirm dose/schedule in your pharmacy ordering system**  Patient Characteristics: Intent of Therapy: Non-Curative / Palliative Intent, Discussed with Patient 

## 2022-06-14 DIAGNOSIS — B182 Chronic viral hepatitis C: Secondary | ICD-10-CM | POA: Diagnosis not present

## 2022-06-14 DIAGNOSIS — K7460 Unspecified cirrhosis of liver: Secondary | ICD-10-CM | POA: Diagnosis not present

## 2022-06-14 DIAGNOSIS — Z48813 Encounter for surgical aftercare following surgery on the respiratory system: Secondary | ICD-10-CM | POA: Diagnosis not present

## 2022-06-14 DIAGNOSIS — J90 Pleural effusion, not elsewhere classified: Secondary | ICD-10-CM | POA: Diagnosis not present

## 2022-06-14 DIAGNOSIS — D630 Anemia in neoplastic disease: Secondary | ICD-10-CM | POA: Diagnosis not present

## 2022-06-14 DIAGNOSIS — N179 Acute kidney failure, unspecified: Secondary | ICD-10-CM | POA: Diagnosis not present

## 2022-06-14 DIAGNOSIS — Z87891 Personal history of nicotine dependence: Secondary | ICD-10-CM | POA: Diagnosis not present

## 2022-06-14 DIAGNOSIS — I7300 Raynaud's syndrome without gangrene: Secondary | ICD-10-CM | POA: Diagnosis not present

## 2022-06-14 DIAGNOSIS — J9621 Acute and chronic respiratory failure with hypoxia: Secondary | ICD-10-CM | POA: Diagnosis not present

## 2022-06-14 DIAGNOSIS — Z853 Personal history of malignant neoplasm of breast: Secondary | ICD-10-CM | POA: Diagnosis not present

## 2022-06-14 DIAGNOSIS — E871 Hypo-osmolality and hyponatremia: Secondary | ICD-10-CM | POA: Diagnosis not present

## 2022-06-14 DIAGNOSIS — M542 Cervicalgia: Secondary | ICD-10-CM | POA: Diagnosis not present

## 2022-06-14 DIAGNOSIS — C220 Liver cell carcinoma: Secondary | ICD-10-CM | POA: Diagnosis not present

## 2022-06-14 DIAGNOSIS — C50919 Malignant neoplasm of unspecified site of unspecified female breast: Secondary | ICD-10-CM | POA: Diagnosis not present

## 2022-06-14 DIAGNOSIS — E43 Unspecified severe protein-calorie malnutrition: Secondary | ICD-10-CM | POA: Diagnosis not present

## 2022-06-14 DIAGNOSIS — R180 Malignant ascites: Secondary | ICD-10-CM | POA: Diagnosis not present

## 2022-06-14 DIAGNOSIS — J449 Chronic obstructive pulmonary disease, unspecified: Secondary | ICD-10-CM | POA: Diagnosis not present

## 2022-06-14 DIAGNOSIS — Z4801 Encounter for change or removal of surgical wound dressing: Secondary | ICD-10-CM | POA: Diagnosis not present

## 2022-06-15 ENCOUNTER — Telehealth: Payer: Self-pay | Admitting: Hematology

## 2022-06-15 ENCOUNTER — Encounter (HOSPITAL_COMMUNITY): Payer: Self-pay

## 2022-06-15 ENCOUNTER — Other Ambulatory Visit: Payer: Self-pay | Admitting: Hematology

## 2022-06-15 ENCOUNTER — Other Ambulatory Visit: Payer: Self-pay

## 2022-06-15 NOTE — Telephone Encounter (Signed)
I called pt and left her a message.  I have talked to Dr. Kathlene Cote, who also reviewed her recent MRI.  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  I recommend Tecentriq and beva every 3 weeks. Will get her scheduled to start next week. She has stopped tamoxifen, she is not able [facility] PET scan due to tamoxifen.  Truitt Merle  06/15/2022

## 2022-06-16 ENCOUNTER — Telehealth: Payer: Self-pay | Admitting: Hematology

## 2022-06-16 NOTE — Telephone Encounter (Signed)
.  Called patient to schedule appointment per 7/19 inbasket, patient is aware of date and time.   

## 2022-06-21 ENCOUNTER — Other Ambulatory Visit: Payer: Self-pay

## 2022-06-22 NOTE — Progress Notes (Signed)
Pharmacist Chemotherapy Monitoring - Initial Assessment    Anticipated start date: 06/29/22   The following has been reviewed per standard work regarding the patient's treatment regimen: The patient's diagnosis, treatment plan and drug doses, and organ/hematologic function Lab orders and baseline tests specific to treatment regimen  The treatment plan start date, drug sequencing, and pre-medications Prior authorization status  Patient's documented medication list, including drug-drug interaction screen and prescriptions for anti-emetics and supportive care specific to the treatment regimen The drug concentrations, fluid compatibility, administration routes, and timing of the medications to be used The patient's access for treatment and lifetime cumulative dose history, if applicable  The patient's medication allergies and previous infusion related reactions, if applicable   Changes made to treatment plan:  N/A  Follow up needed:  N/A   Larene Beach, RPH, 06/22/2022  4:39 PM

## 2022-06-28 ENCOUNTER — Other Ambulatory Visit: Payer: Self-pay | Admitting: Hematology

## 2022-06-28 ENCOUNTER — Inpatient Hospital Stay: Payer: BC Managed Care – PPO

## 2022-06-28 ENCOUNTER — Other Ambulatory Visit: Payer: Self-pay

## 2022-06-28 MED ORDER — PROMETHAZINE HCL 12.5 MG PO TABS: 12.5000 mg | ORAL_TABLET | Freq: Every day | ORAL | 0 refills | Status: DC | PRN

## 2022-06-29 ENCOUNTER — Inpatient Hospital Stay: Payer: BC Managed Care – PPO

## 2022-06-29 ENCOUNTER — Encounter: Payer: Self-pay | Admitting: Hematology

## 2022-06-29 ENCOUNTER — Other Ambulatory Visit: Payer: Self-pay

## 2022-06-29 ENCOUNTER — Inpatient Hospital Stay (HOSPITAL_BASED_OUTPATIENT_CLINIC_OR_DEPARTMENT_OTHER): Payer: BC Managed Care – PPO | Admitting: Hematology

## 2022-06-29 ENCOUNTER — Inpatient Hospital Stay: Payer: BC Managed Care – PPO | Attending: Hematology

## 2022-06-29 VITALS — BP 85/61 | HR 90 | Resp 16

## 2022-06-29 VITALS — BP 84/66 | HR 97 | Temp 97.9°F | Resp 15 | Wt 87.7 lb

## 2022-06-29 DIAGNOSIS — Z79899 Other long term (current) drug therapy: Secondary | ICD-10-CM | POA: Insufficient documentation

## 2022-06-29 DIAGNOSIS — C50111 Malignant neoplasm of central portion of right female breast: Secondary | ICD-10-CM | POA: Diagnosis not present

## 2022-06-29 DIAGNOSIS — C220 Liver cell carcinoma: Secondary | ICD-10-CM | POA: Diagnosis not present

## 2022-06-29 DIAGNOSIS — Z5112 Encounter for antineoplastic immunotherapy: Secondary | ICD-10-CM | POA: Diagnosis not present

## 2022-06-29 DIAGNOSIS — I959 Hypotension, unspecified: Secondary | ICD-10-CM | POA: Diagnosis not present

## 2022-06-29 DIAGNOSIS — Z5111 Encounter for antineoplastic chemotherapy: Secondary | ICD-10-CM | POA: Diagnosis not present

## 2022-06-29 DIAGNOSIS — Z170 Estrogen receptor positive status [ER+]: Secondary | ICD-10-CM | POA: Diagnosis not present

## 2022-06-29 DIAGNOSIS — E871 Hypo-osmolality and hyponatremia: Secondary | ICD-10-CM | POA: Diagnosis not present

## 2022-06-29 LAB — CMP (CANCER CENTER ONLY)
ALT: 32 U/L (ref 0–44)
AST: 40 U/L (ref 15–41)
Albumin: 2.7 g/dL — ABNORMAL LOW (ref 3.5–5.0)
Alkaline Phosphatase: 77 U/L (ref 38–126)
Anion gap: 5 (ref 5–15)
BUN: 50 mg/dL — ABNORMAL HIGH (ref 6–20)
CO2: 19 mmol/L — ABNORMAL LOW (ref 22–32)
Calcium: 7.9 mg/dL — ABNORMAL LOW (ref 8.9–10.3)
Chloride: 103 mmol/L (ref 98–111)
Creatinine: 1.33 mg/dL — ABNORMAL HIGH (ref 0.44–1.00)
GFR, Estimated: 47 mL/min — ABNORMAL LOW (ref >60)
Glucose, Bld: 114 mg/dL — ABNORMAL HIGH (ref 70–99)
Potassium: 4.5 mmol/L (ref 3.5–5.1)
Sodium: 127 mmol/L — ABNORMAL LOW (ref 135–145)
Total Bilirubin: 0.8 mg/dL (ref 0.3–1.2)
Total Protein: 5.9 g/dL — ABNORMAL LOW (ref 6.5–8.1)

## 2022-06-29 LAB — CBC WITH DIFFERENTIAL (CANCER CENTER ONLY)
Abs Immature Granulocytes: 0.12 10*3/uL — ABNORMAL HIGH (ref 0.00–0.07)
Basophils Absolute: 0.1 10*3/uL (ref 0.0–0.1)
Basophils Relative: 1 %
Eosinophils Absolute: 0.3 10*3/uL (ref 0.0–0.5)
Eosinophils Relative: 2 %
HCT: 31.5 % — ABNORMAL LOW (ref 36.0–46.0)
Hemoglobin: 10.1 g/dL — ABNORMAL LOW (ref 12.0–15.0)
Immature Granulocytes: 1 %
Lymphocytes Relative: 6 %
Lymphs Abs: 0.8 10*3/uL (ref 0.7–4.0)
MCH: 25.8 pg — ABNORMAL LOW (ref 26.0–34.0)
MCHC: 32.1 g/dL (ref 30.0–36.0)
MCV: 80.6 fL (ref 80.0–100.0)
Monocytes Absolute: 2.2 10*3/uL — ABNORMAL HIGH (ref 0.1–1.0)
Monocytes Relative: 15 %
Neutro Abs: 10.7 10*3/uL — ABNORMAL HIGH (ref 1.7–7.7)
Neutrophils Relative %: 75 %
Platelet Count: 115 10*3/uL — ABNORMAL LOW (ref 150–400)
RBC: 3.91 MIL/uL (ref 3.87–5.11)
RDW: 19 % — ABNORMAL HIGH (ref 11.5–15.5)
WBC Count: 14.2 10*3/uL — ABNORMAL HIGH (ref 4.0–10.5)
nRBC: 0.1 % (ref 0.0–0.2)

## 2022-06-29 LAB — TOTAL PROTEIN, URINE DIPSTICK: Protein, ur: <30 mg/dL — AB

## 2022-06-29 LAB — TSH: TSH: 4.1 u[IU]/mL (ref 0.350–4.500)

## 2022-06-29 MED ORDER — SODIUM CHLORIDE 0.9 % IV SOLN: Freq: Once | INTRAVENOUS | Status: AC

## 2022-06-29 MED ORDER — SODIUM CHLORIDE 0.9 % IV SOLN
15.0000 mg/kg | Freq: Once | INTRAVENOUS | Status: AC
  Administered 2022-06-29: 600 mg via INTRAVENOUS
  Filled 2022-06-29: qty 16

## 2022-06-29 MED ORDER — FLUCONAZOLE 100 MG PO TABS: 100.0000 mg | ORAL_TABLET | Freq: Once | ORAL | 0 refills | Status: AC

## 2022-06-29 MED ORDER — SODIUM CHLORIDE 0.9 % IV SOLN
1200.0000 mg | Freq: Once | INTRAVENOUS | Status: AC
  Administered 2022-06-29: 1200 mg via INTRAVENOUS
  Filled 2022-06-29: qty 20

## 2022-06-29 NOTE — Progress Notes (Signed)
[place]   [phone] [phone]   Clinic Follow up Note   Patient Care Team: Seward Carol, MD as PCP - General (Internal Medicine) Arna Snipe, RN as Oncology Nurse Navigator Irene Shipper, MD as Consulting Physician (Gastroenterology) Truitt Merle, MD as Consulting Physician (Hematology) Alla Feeling, NP as Nurse Practitioner (Nurse Practitioner) Stark Klein, MD as Consulting Physician (General Surgery) Aletta Edouard, MD as Consulting Physician (Interventional Radiology) Roosevelt Locks, CRNP as Nurse Practitioner (Nurse Practitioner) Mauro Kaufmann, RN as Oncology Nurse Navigator Rockwell Germany, RN as Oncology Nurse Navigator Eppie Gibson, MD as Attending Physician (Radiation Oncology) Trula Slade, DPM as Consulting Physician (Podiatry)  Date of Service:  06/29/2022  CHIEF COMPLAINT: f/u of multifocal [place], right breast cancer  CURRENT THERAPY:    ASSESSMENT & PLAN:  Theresa Rocha is a 56 y.o. female with   1. Multifocal Hepatocellular carcinoma, cT2N0Mx, stage II -presented with abdominal pain and rectal bleeding in 06/2019. Work up scans showed multifocal lesions of liver consistent with [place] with indeterminate right lobe lesions. AFP was WNL. -she was found to not be a transplant candidate due to history of breast cancer and multifocal HCC beyond Milan criteria. She was also not felt to be a surgical candidate due to her liver function and location of tumors. She has been on surveillance. -she was initially not felt to be a good candidate for liver-targeted ablation. She was recently found to have enlarging left lobe liver lesions on surveillance CT in 06/2021. She proceeded with left lobe ablation on 09/16/21 with Dr. Kathlene Cote. -PET on 12/15/21 showed hypermetabolism only to the right hepatic lobe, no other distant metastasis. She underwent right lobe Y90 on 12/22/21 with Dr. Kathlene Cote. -Liver MRI from 06/06/22 showed cancer  progression of on previously treated lesion in segment 7 with extra capsular extension and adenopathy, consistent with disease progression.  I talked to Dr. Kathlene Cote, and both of [place] think systemic treatment is a better option for her now.  -She is scheduled to start systemic treatment Tecentriq and bevacizumab for [facility], I again reviewed the potential side effects with her, she is agreeable to start.   2. Recurrent left Pleural Effusion -occurred following left lobe liver ablation for [place] on 09/16/21 -has been recurrent and persistent, "suspicious for iatrogenic hepatic hydrothorax with ascites preferentially flowing into the left pleural space after the left lobe microwave ablation" per IR -cytology from several thoracentesis were all negative. -PET scan negative for thoracic metastasis -she required PleurX catheter placement 03/09/22. She reports she is draining it daily now with 1L removal daily, but still quite symptomatic with dyspnea. -She has been seen by cardiothoracic surgeon Dr. Kipp Brood and felt to be not a candidate for pleurodesis,  -abdomen MRI 06/06/22 showed nodularity in the left pleura, concerning for malignancy -she is scheduled for [place] PET scan on 08/13/22 due to her recent tamoxifen    3. Hyponatremia  -Refractory, secondary to her liver cirrhosis and high output from thoracic Pleurx -I encourage her to drink more fluids    4. 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 Due to knee joint pain, she was switched to Tamoxifen in 03/2020. She is tolerating with hot flashes.  -most recent mammogram from 05/13/22 showed breast calcification in right side. Biopsy was recommended, however  patient would like to hold for now due to her weakness. -Tamoxifen held since 06/15/22 for work up of  recurrent PE   5. 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  -s/p harvoni and ribavirin treatment for Hep C per Dr. Bradd Burner in 2018 -EGD on 10/27/21 by Dr. Henrene Pastor while hospitalized showed esophageal varices, which were banded at that time. No residual varices on repeat EGD on 01/04/22. -Continue to F/u with Dr Henrene Pastor and Roosevelt Locks   6. Alcohol and smoking Cessation  -She has h/o smoking 0.5 PPD x35 years. She drank 3-4 cans beer nightly x20 years. -Cessation of both was discussed and strongly encouraged. She has stopped drinking alcohol but smokes 5 cigarettes a day still. I encouraged her to continue reducing until she quits completely.    7. Osteoporosis, Hypocalcemia  -DEXA from 09/21/19 at her GYN office showed osteoporosis T score -3.1 at [facility].   -I started her on Zometa q10months for 2 years on 07/17/20. Held before 3rd dose on 05/01/21, due to mouth sores. She has full sets of dentures and has not seen dentist in years. -She has developed hand and leg cramps, I again encouraged her to start Calcium supplement daily    8. Thrombocytopenia, [place] -secondary to liver cirrhosis, splenomegaly, and/or varices   9. Hypotension  -Likely multifactorial, secondary to high Pleurx output and low oral intake -She is on midodrine, blood pressure little low in office today, she missed 1 dose today. -We will give her some IV fluids today  PLAN:  -Lab reviewed, adequate for treatment, will proceed first cycle Tecentriq and Beva today -We will give normal saline 500 cc today -Lab, follow-up and IV fluids 2 hours next week -I encouraged her to follow-up with PCP and liver clinic   No problem-specific Assessment & Plan notes found for this encounter.   SUMMARY OF ONCOLOGIC HISTORY: Oncology History Overview Note  Cancer Staging Hepatocellular carcinoma [facility]) Staging form: Liver, AJCC 8th Edition - Clinical  stage from 08/02/2019: Stage II (cT2, cN0, cM0) - Signed by Truitt Merle, MD on 08/02/2019  Malignant neoplasm of central portion of right breast in female, estrogen receptor positive ([place]) Staging form: Breast, AJCC 8th Edition - Clinical stage from 08/29/2019: Stage IB (cT2, cN0, cM0, G2, ER+, PR+, HER2-) - Signed by Truitt Merle, MD on 09/05/2019    Hepatocellular carcinoma ([place])  07/04/2019 Imaging   CT AP IMPRESSION: 1. Findings of cirrhosis and hepatomegaly.  Patent portal vein. 2. Numerous varicosities as described above. 3. Moderate to large amount of abdominopelvic ascites 4. Contrast filled appendix which appears to be a mildly dilated with apparent wall thickening which could be due to surrounding ascites.   07/09/2019 Imaging   ABD US IMPRESSION: 1. Three small hypoechoic lesions in the RIGHT hepatic lobe measuring less than 1 cm but not evident on comparison contrast enhanced CT from 07/04/2019. Recommend MRI of the abdomen without and with contrast to evaluate these hepatic lesions in cirrhotic patient. 2. Increased liver echogenicity and lobular contour consistent cirrhosis. 3. Moderate mild to moderate volume of ascites. 4. Mild gallbladder wall thickening likely related to ascites. 5. No cholelithiasis or cholecystitis evident   07/26/2019 Imaging   MR ABD IMPRESSION: 1. In the lateral segment left hepatic lobe there are two LI-RADS category LR-5 lesions representing hepatocellular carcinoma. 2. There is also a LI-RADS category LR-3 lesion peripherally in the right hepatic lobe, intermediate probability  for hepatocellular carcinoma. 3. Hepatic cirrhosis and diffuse wall thickening of the gallbladder. Widespread steatosis in the liver. 4. 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 5.  Aortic Atherosclerosis (ICD10-I70.0). 6. Widespread ascites and mesenteric edema.   07/26/2019 Tumor Marker   AFP 5.5 (baseline)   08/02/2019 Initial Diagnosis    Hepatocellular carcinoma ([place])   08/02/2019 Cancer Staging   Staging form: Liver, AJCC 8th Edition - Clinical stage from 08/02/2019: Stage II (cT2, cN0, cM0) - Signed by Truitt Merle, MD on 08/02/2019   08/07/2019 Imaging   CT Chest 08/07/19 IMPRESSION: 1. Tiny bilateral pulmonary nodules measuring up to 6 mm. These are too small to definitively characterize. Given the history of HCC, close follow-up recommended to ensure stability. 2.  Emphysema. (ICD10-J43.9)   08/09/2019 Procedure   Upper Endoscopy by Dr Henrene Pastor 08/09/19  IMPRESSION 1. Small esophageal varices 2. Mild diffuse portal hypertensive gastropathy 3. Otherwise normal EGD.   02/04/2020 Imaging   MRI abdomen  IMPRESSION: Stable exam. The small segment II lesions identified previously as LI-RADS category 5 are stable in size.   Previously characterized tiny LI-RADS 3 lesion in the inferior right liver is stable.   No new suspicious focal hepatic lesion on a background liver morphology consistent with cirrhosis.   Interval decrease in ascites with decrease and diffuse gallbladder wall thickening.     06/18/2020 Imaging   MRI Abdomen  IMPRESSION: 1. Stable 1.1 x 0.9 cm rounded lesion in the medial left lobe of the liver, hepatic segment II, demonstrating arterial phase hyperenhancement without washout or capsule. This has previously been described as a LI-RADS category 5 lesion based on prior observations, if categorized by current imaging features this is consistent with LI-RADS category 4. 2. Stable 1.5 x 1.2 cm lesion lateral and inferior to this lesion in hepatic segment II with very subtle evidence of washout but without capsular enhancement. LI-RADS category 5. 3. Stable 5 mm focus of early arterial phase hyperenhancement of the inferior right lobe of the liver, hepatic segment VI, without evidence of washout or capsule. This remains consistent with LI-RADS category 3. 4. No new suspicious lesions or contrast  enhancement. 5. Cirrhotic morphology of the liver. 6. Trace perihepatic ascites.     10/30/2021 Imaging   EXAM: MRI ABDOMEN WITHOUT AND WITH CONTRAST  IMPRESSION: 1. Ablation defect of the left lobe of the liver which subtends two lesions previously noted in hepatic segment II. No evidence of residual contrast enhancement to suggest viable tumor.  2. Significant interval increase in size of a hyperenhancing lesion of the posterior liver dome, hepatic segment VII, measuring 4.0 x 3.7 cm, previously 2.6 x 2.5 cm when measured similarly. This demonstrates some heterogeneous contrast enhancement although no clear evidence of washout or capsular enhancement. Findings remain LI-RADS category 5, consistent with hepatocellular carcinoma. 3. Slight interval increase in size of a hyperenhancing lesion of the inferior tip of the right lobe of the liver, hepatic segment VI, measuring 1.1 x 0.7 cm, previously no greater than 0.6 cm, as well as an additional adjacent lesion measuring 0.6 cm, previously 0.4 cm. Given threshold growth, these lesions are upstaged to LI-RADS category 4 and suspicious for additional small foci of hepatocellular carcinoma. 4. Cirrhotic morphology of the liver and hepatic steatosis. 5. Small volume ascites throughout the abdomen. 6. Large left, small right pleural effusions and associated atelectasis or consolidation. 7. Layering sludge in the gallbladder with mild gallbladder wall thickening and pericholecystic fluid, similar to prior examination although nonspecific in  the setting of ascites. No discrete gallstones.   11/10/2021 Imaging   EXAM: CT ABDOMEN WITHOUT AND WITH CONTRAST  IMPRESSION: Post treatment changes about the LEFT hepatic lobe lesion with variable arterial enhancement not showing washout and with non masslike features, favor perfusional defect, characterized as TR equivocal but favor nonviable.   Lesion in the posterior RIGHT hepatic lobe over time with  enlargement and with serpiginous internal low signal on MR and low attenuation on CT. Findings categorized as LR M given signs of rim like enhancement on previous imaging and infiltrative appearance. Potentially infiltrative HCC but without specific imaging features of hepatocellular carcinoma by LI-RADS.   Enlarging lesion in the inferior RIGHT hepatic lobe greater than 50% diameter increase over 6 month interval, LI-RADS category 5 showing non rim arterial phase enhancement.   Additional foci of arterial phase enhancement without definitive washout, categorized as LI-RADS category 3 but in aggregate are more suspicious for small foci of hepatocellular carcinoma, attention on follow-up.   11/19/2021 Pathology Results   A. PLEURAL FLUID, LEFT, THORACENTESIS:   FINAL MICROSCOPIC DIAGNOSIS:  - No malignant cells identified  - Reactive mesothelial cells present    12/15/2021 PET scan   IMPRESSION: 1. Intensely hypermetabolic mass in the posterior RIGHT hepatic lobe consistent with known hepatocellular carcinoma. Smaller lesions identified on MRI are not hypermetabolic. 2. No evidence of metastatic lymphadenopathy or distant metastatic disease. 3. Elongated nodular thickening in the lingula is favored benign. Moderate LEFT pleural effusion.   06/29/2022 -  Chemotherapy   Patient is on Treatment Plan : LUNG Atezolizumab + Bevacizumab q21d Maintenance     Malignant neoplasm of central portion of right breast in female, estrogen receptor positive ([place])  08/29/2019 Cancer Staging   Staging form: Breast, AJCC 8th Edition - Clinical stage from 08/29/2019: Stage IB (cT2, cN0, cM0, G2, ER+, PR+, HER2-) - Signed by Truitt Merle, MD on 09/05/2019   08/29/2019 Mammogram   Diagnostic mammogram 08/29/19 IMPRESSION: Suspicious mass in the 12 o'clock region of the right breast 3 cm from the nipple measuring 3.2 x 1.6 x 2.6 cm   08/29/2019 Initial Biopsy   Diagnosis 08/29/19 Breast, right, needle core biopsy, 12  o'clock - INVASIVE DUCTAL CARCINOMA, SEE COMMENT.   08/29/2019 Receptors her2   Results: IMMUNOHISTOCHEMICAL AND MORPHOMETRIC ANALYSIS PERFORMED MANUALLY The tumor cells are NEGATIVE for Her2 (1+). Estrogen Receptor: 100%, POSITIVE, STRONG STAINING INTENSITY Progesterone Receptor: 90%, POSITIVE, STRONG STAINING INTENSITY Proliferation Marker Ki67: 15%   09/05/2019 Initial Diagnosis   Malignant neoplasm of central portion of right breast in female, estrogen receptor positive ([place])   09/13/2019 Breast MRI   MRI breast 09/13/19 IMPRESSION: 1. Irregular spiculated enhancing mass in the right breast representing the patient's known malignancy. No abnormal right axillary lymph nodes. 2. Prominent left internal mammary lymph node is indeterminate. No abnormal appearing right internal mammary lymph nodes. 3. No evidence of malignancy in the left breast.   10/16/2019 Cancer Staging   Staging form: Breast, AJCC 8th Edition - Pathologic stage from 10/16/2019: Stage IA (pT2, pN0, cM0, G2, ER+, PR+, HER2-, Oncotype DX score: 21) - Signed by Gardenia Phlegm, NP on 11/07/2019   10/16/2019 Oncotype testing   Oncotype 10/16/19 Recurrence score 21 with 7% benefit of Tamoxifen or AI. There is less than 1 % benefit of chemo.    10/16/2019 Surgery   RIGHT BREAST LUMPECTOMY WITH SENTINEL LYMPH NODE BIOPSY by Dr Barry Dienes 10/16/19    10/16/2019 Pathology Results   FINAL MICROSCOPIC DIAGNOSIS: 10/16/19  A. BREAST, RIGHT, LUMPECTOMY:  - Invasive ductal carcinoma, 2.3 cm, Nottingham grade 2 of 3.  - Margins of resection are not involved (Closest margin: 1 mm,  anterior).  - See oncology table.   B. SENTINEL LYMPH NODE, RIGHT AXILLARY #1, BIOPSY:  - One lymph node, negative for carcinoma (0/1).   C. SENTINEL LYMPH NODE, RIGHT AXILLARY #2, BIOPSY:  - One lymph node, negative for carcinoma (0/1).    09/2019 -  Anti-estrogen oral therapy   Anastrozole $RemoveBefo'1mg'OsbxKfGjegn$  daily started in Nov 2020. Due to  worsened knee pain/arthritis, she was switched to Tamoxifen in 03/2020.    12/03/2019 - 12/31/2019 Radiation Therapy   Adjuvant RT with Dr Isidore Moos 12/03/19-12/31/19   04/01/2020 Survivorship   SCP delivered by virtual visit per Cira Rue, NP       INTERVAL HISTORY:  Theresa Rocha is here for a follow up of her [place]. She was last seen by me on 06/09/2022. She presents to the clinic alone.  She had attributed to [facility] to visit her family last few weeks, and was hospitalized over there for hyponatremia, hyperkalemia, and malfunction of the Pleurx.  She was discharged after a few day stay, and has been feeling very fatigued since then.  She is still able to function at home, with self-care and limited light housework.  Her Pleurx still drains about 1 L a day, she has noticed significant leg and hand cramps, and intermittent hand shaking.  She had been taking midodrine for hypotension, still has mild intermittent dizziness.  Her appetite is low, she is trying to eat more and drink fluids adequately.   All other systems were reviewed with the patient and are negative.  MEDICAL HISTORY:  Past Medical History:  Diagnosis Date   Anemia    Breast cancer ([place]) 08/30/2019   Cancer ([place])    liver- just diagnosed, not started treatment yet   Cervicalgia    COPD (chronic obstructive pulmonary disease) (HCC)    Esophageal varices (HCC)    GERD (gastroesophageal reflux disease)    Headache    Hepatic cirrhosis (HCC)    Hepatitis C    resolved 2018   Hepatocellular carcinoma (H[facility])    IBS (irritable bowel syndrome)    Lactose intolerance    Personal history of radiation therapy 11/2019   Right breast   Portal hypertension ([place])    Raynaud's syndrome    Thrombocytopenia ([place])     SURGICAL HISTORY: Past Surgical History:  Procedure Laterality Date   BREAST BIOPSY Right 09/2019   BREAST LUMPECTOMY Right 09/2019   BREAST LUMPECTOMY WITH AXILLARY LYMPH NODE BIOPSY Right 10/16/2019   Procedure: RIGHT  BREAST LUMPECTOMY WITH SENTINEL LYMPH NODE BIOPSY;  Surgeon: Stark Klein, MD;  Location: [place];  Service: General;  Laterality: Right;   COLONOSCOPY     ESOPHAGEAL BANDING  10/27/2021   Procedure: ESOPHAGEAL BANDING;  Surgeon: Irene Shipper, MD;  Location: [facility];  Service: Endoscopy;;   ESOPHAGOGASTRODUODENOSCOPY (EGD) WITH PROPOFOL N/A 10/27/2021   Procedure: ESOPHAGOGASTRODUODENOSCOPY (EGD) WITH PROPOFOL;  Surgeon: Irene Shipper, MD;  Location: [facility];  Service: Endoscopy;  Laterality: N/A;   ESOPHAGOGASTRODUODENOSCOPY (EGD) WITH PROPOFOL N/A 01/04/2022   Procedure: ESOPHAGOGASTRODUODENOSCOPY (EGD) WITH PROPOFOL;  Surgeon: Irene Shipper, MD;  Location: [facility];  Service: Endoscopy;  Laterality: N/A;   HAMMER TOE SURGERY Left 2012   pins placed in great toe, 2nd,3rd, 4th toes, all pins removed except great toe   IR ANGIOGRAM SELECTIVE EACH ADDITIONAL  VESSEL  12/16/2021   IR ANGIOGRAM SELECTIVE EACH ADDITIONAL VESSEL  12/16/2021   IR ANGIOGRAM SELECTIVE EACH ADDITIONAL VESSEL  12/22/2021   IR ANGIOGRAM VISCERAL SELECTIVE  12/16/2021   IR ANGIOGRAM VISCERAL SELECTIVE  12/22/2021   IR EMBO TUMOR ORGAN ISCHEMIA INFARCT INC GUIDE ROADMAPPING  12/22/2021   IR PARACENTESIS  11/13/2019   IR PARACENTESIS  03/04/2022   IR PATIENT EVAL TECH 0-60 MINS  05/19/2022   IR PERC PLEURAL DRAIN W/INDWELL CATH W/IMG GUIDE  03/09/2022   IR RADIOLOGIST EVAL & MGMT  08/16/2019   IR RADIOLOGIST EVAL & MGMT  02/06/2020   IR RADIOLOGIST EVAL & MGMT  06/26/2020   IR RADIOLOGIST EVAL & MGMT  12/31/2020   IR RADIOLOGIST EVAL & MGMT  07/21/2021   IR RADIOLOGIST EVAL & MGMT  10/08/2021   IR RADIOLOGIST EVAL & MGMT  01/13/2022   IR RADIOLOGIST EVAL & MGMT  03/05/2022   IR THORACENTESIS ASP PLEURAL SPACE W/IMG GUIDE  10/12/2021   IR THORACENTESIS ASP PLEURAL SPACE W/IMG GUIDE  10/26/2021   IR THORACENTESIS ASP PLEURAL SPACE W/IMG GUIDE  11/19/2021   IR THORACENTESIS ASP PLEURAL SPACE W/IMG GUIDE  12/22/2021   IR US  GUIDE VASC ACCESS RIGHT  12/16/2021   IR US GUIDE VASC ACCESS RIGHT  12/22/2021   PELVIC LAPAROSCOPY Bilateral 1992   RADIOLOGY WITH ANESTHESIA N/A 09/16/2021   Procedure: CT MICROWAVE ABLATION;  Surgeon: Aletta Edouard, MD;  Location: [facility];  Service: Radiology;  Laterality: N/A;   UPPER GASTROINTESTINAL ENDOSCOPY  2002   had esophageal dilitation    I have reviewed the social history and family history with the patient and they are unchanged from previous note.  ALLERGIES:  is allergic to hydromorphone hcl and aspirin.  MEDICATIONS:  Current Outpatient Medications  Medication Sig Dispense Refill   fluconazole (DIFLUCAN) 100 MG tablet Take 1 tablet (100 mg total) by mouth once for 1 dose. 1 tablet 0   baclofen (LIORESAL) 10 MG tablet Take 5 mg by mouth daily as needed for muscle spasms. (Patient not taking: Reported on 06/01/2022)     dicyclomine (BENTYL) 20 MG tablet Take 0.5 tablets (10 mg total) by mouth every 6 (six) hours as needed for spasms. Take one tablet every 6-8 hours as needed for abdominal cramping (Patient taking differently: Take 20 mg by mouth daily as needed (abdominal cramping).) 90 tablet 0   gabapentin (NEURONTIN) 300 MG capsule Take 1 capsule (300 mg total) by mouth at bedtime. 30 capsule 1   lactulose (CHRONULAC) 10 GM/15ML solution Take 15 mLs (10 g total) by mouth 2 (two) times daily as needed for mild constipation. 900 mL 2   midodrine (PROAMATINE) 5 MG tablet Take 3 tablets (15 mg total) by mouth 3 (three) times daily with meals. 270 tablet 2   nicotine (NICODERM CQ - DOSED IN MG/24 HOURS) 21 mg/24hr patch Place 21 mg onto the skin daily as needed (smoking cessation on work days). (Patient not taking: Reported on 06/01/2022)     oxyCODONE (OXY IR/ROXICODONE) 5 MG immediate release tablet Take 1-2 tablets (5-10 mg total) by mouth every 4 (four) hours as needed for severe pain. (Patient taking differently: Take 5 mg by mouth daily as needed (pain).) 60 tablet 0    pantoprazole (PROTONIX) 40 MG tablet Take 1 tablet (40 mg total) by mouth daily. 90 tablet 1   Prenatal Vit-Fe Fumarate-FA (PRENATAL PO) Take 1 tablet by mouth daily.     prochlorperazine (COMPAZINE) 10 MG tablet  Take 1 tablet (10 mg total) by mouth every 6 (six) hours as needed for nausea or vomiting. 30 tablet 0   promethazine (PHENERGAN) 12.5 MG tablet Take 1 tablet (12.5 mg total) by mouth daily as needed for nausea or vomiting. 20 tablet 0   sodium chloride 1 g tablet Take 1 tablet (1 g total) by mouth 2 (two) times daily with a meal. 60 tablet 1   tamoxifen (NOLVADEX) 20 MG tablet TAKE 1 TABLET BY MOUTH EVERY DAY (Patient taking differently: Take 20 mg by mouth every evening.) 90 tablet 1   No current facility-administered medications for this visit.   Facility-Administered Medications Ordered in Other Visits  Medication Dose Route Frequency Provider Last Rate Last Admin   0.9 %  sodium chloride infusion   Intravenous Once Truitt Merle, MD       atezolizumab [facility]) 1,200 mg in sodium chloride 0.9 % 250 mL chemo infusion  1,200 mg Intravenous Once Truitt Merle, MD 270 mL/hr at 06/29/22 1552 1,200 mg at 06/29/22 1552   bevacizumab-bvzr (ZIRABEV) 600 mg in sodium chloride 0.9 % 100 mL chemo infusion  15 mg/kg (Treatment Plan Recorded) Intravenous Once Truitt Merle, MD        PHYSICAL EXAMINATION: ECOG PERFORMANCE STATUS: 3 - Symptomatic, >50% confined to bed  Vitals:   06/29/22 1359  BP: (!) 84/66  Pulse: 97  Resp: 15  Temp: 97.9 F (36.6 C)  SpO2: 95%   Wt Readings from Last 3 Encounters:  06/29/22 87 lb 11.2 oz (39.8 kg)  06/09/22 87 lb (39.5 kg)  06/01/22 86 lb 3.2 oz (39.1 kg)     GENERAL:alert, no distress and comfortable SKIN: skin color, texture, turgor are normal, no rashes or significant lesions EYES: normal, Conjunctiva are pink and non-injected, sclera clear NECK: supple, thyroid normal size, non-tender, without nodularity LYMPH:  no palpable lymphadenopathy in the  cervical, axillary  LUNGS: clear to auscultation and percussion with normal breathing effort HEART: regular rate & rhythm and no murmurs and no lower extremity edema ABDOMEN:abdomen soft, non-tender and normal bowel sounds Musculoskeletal:no cyanosis of digits and no clubbing  NEURO: alert & oriented x 3 with fluent speech, no focal motor/sensory deficits  LABORATORY DATA:  I have reviewed the data as listed    Latest Ref Rng & Units 06/29/2022    1:43 PM 06/09/2022    2:07 PM 06/02/2022   12:42 AM  CBC  WBC 4.0 - 10.5 K/uL 14.2  10.1  10.2   Hemoglobin 12.0 - 15.0 g/dL 10.1  10.5  9.8   Hematocrit 36.0 - 46.0 % 31.5  32.6  31.7   Platelets 150 - 400 K/uL 115  143  122         Latest Ref Rng & Units 06/29/2022    1:43 PM 06/09/2022    2:07 PM 06/02/2022   12:42 AM  CMP  Glucose 70 - 99 mg/dL 114  122  127   BUN 6 - 20 mg/dL 50  22  19   Creatinine 0.44 - 1.00 mg/dL 1.33  0.74  0.64   Sodium 135 - 145 mmol/L 127  125  127   Potassium 3.5 - 5.1 mmol/L 4.5  5.5  5.5   Chloride 98 - 111 mmol/L 103  101  106   CO2 22 - 32 mmol/L $RemoveB'19  19  17   '[id number]$ Calcium 8.9 - 10.3 mg/dL 7.9  8.2  7.6   Total Protein 6.5 - 8.1 g/dL 5.9  5.9  Total Bilirubin 0.3 - 1.2 mg/dL 0.8  1.4    Alkaline Phos 38 - 126 U/L 77  84    AST 15 - 41 U/L 40  42    ALT 0 - 44 U/L 32  30        RADIOGRAPHIC STUDIES: I have personally reviewed the radiological images as listed and agreed with the findings in the report. No results found.    No orders of the defined types were placed in this encounter.  All questions were answered. The patient knows to call the clinic with any problems, questions or concerns. No barriers to learning was detected. The total time spent in the appointment was 40 minutes.     Truitt Merle, MD 06/29/2022   I, Wilburn Mylar, am acting as scribe for Truitt Merle, MD.   I have reviewed the above documentation for accuracy and completeness, and I agree with the above.

## 2022-06-29 NOTE — Patient Instructions (Signed)
[facility]  Discharge Instructions: Thank you for choosing [facility] to provide your oncology and hematology care.   If you have a lab appointment with the [facility], please go directly to the [facility] and check in at the registration area.   Wear comfortable clothing and clothing appropriate for easy access to any Portacath or PICC line.   We strive to give you quality time with your provider. You may need to reschedule your appointment if you arrive late (15 or more minutes).  Arriving late affects you and other patients whose appointments are after yours.  Also, if you miss three or more appointments without notifying the office, you may be dismissed from the clinic at the provider's discretion.      For prescription refill requests, have your pharmacy contact our office and allow 72 hours for refills to be completed.    Today you received the following chemotherapy and/or immunotherapy agents Baron Sane      To help prevent nausea and vomiting after your treatment, we encourage you to take your nausea medication as directed.  BELOW ARE SYMPTOMS THAT SHOULD BE REPORTED IMMEDIATELY: *FEVER GREATER THAN 100.4 F (38 C) OR HIGHER *CHILLS OR SWEATING *NAUSEA AND VOMITING THAT IS NOT CONTROLLED WITH YOUR NAUSEA MEDICATION *UNUSUAL SHORTNESS OF BREATH *UNUSUAL BRUISING OR BLEEDING *URINARY PROBLEMS (pain or burning when urinating, or frequent urination) *BOWEL PROBLEMS (unusual diarrhea, constipation, pain near the anus) TENDERNESS IN MOUTH AND THROAT WITH OR WITHOUT PRESENCE OF ULCERS (sore throat, sores in mouth, or a toothache) UNUSUAL RASH, SWELLING OR PAIN  UNUSUAL VAGINAL DISCHARGE OR ITCHING   Items with * indicate a potential emergency and should be followed up as soon as possible or go to the Emergency Department if any problems should occur.  Please show the CHEMOTHERAPY ALERT CARD or IMMUNOTHERAPY ALERT CARD at  check-in to the Emergency Department and triage nurse.  Should you have questions after your visit or need to cancel or reschedule your appointment, please contact [facility]  Dept: [phone]  and follow the prompts.  Office hours are 8:00 a.m. to 4:30 p.m. Monday - Friday. Please note that voicemails left after 4:00 p.m. may not be returned until the following business day.  We are closed weekends and major holidays. You have access to a nurse at all times for urgent questions. Please call the main number to the clinic Dept: [phone] and follow the prompts.   For any non-urgent questions, you may also contact your provider using MyChart. We now offer e-Visits for anyone 52 and older to request care online for non-urgent symptoms. For details visit [email or web address].   Also download the MyChart app! Go to the app store, search "MyChart", open the app, select [facility], and log in with your MyChart username and password.  Masks are optional in the cancer centers. If you would like for your care team to wear a mask while they are taking care of you, please let them know. You may have one support person who is at least 55 years old accompany you for your appointments. Atezolizumab injection Gildardo Pounds) What is this medication? ATEZOLIZUMAB (a te zoe LIZ ue mab) is a monoclonal antibody. It is used to treat bladder cancer (urothelial cancer), liver cancer, lung cancer, and melanoma. This medicine may be used for other purposes; ask your health care provider or pharmacist if you have questions. COMMON BRAND NAME(S): Tecentriq What should I tell my  care team before I take this medication? They need to know if you have any of these conditions: autoimmune diseases like Crohn's disease, ulcerative colitis, or lupus have had or planning to have an allogeneic stem cell transplant (uses someone else's stem cells) history of organ transplant history of radiation  to the chest nervous system problems like myasthenia gravis or Guillain-Barre syndrome an unusual or allergic reaction to atezolizumab, other medicines, foods, dyes, or preservatives pregnant or trying to get pregnant breast-feeding How should I use this medication? This medicine is for infusion into a vein. It is given by a health care professional in a hospital or clinic setting. A special MedGuide will be given to you before each treatment. Be sure to read this information carefully each time. Talk to your pediatrician regarding the use of this medicine in children. Special care may be needed. Overdosage: If you think you have taken too much of this medicine contact a poison control center or emergency room at once. NOTE: This medicine is only for you. Do not share this medicine with others. What if I miss a dose? It is important not to miss your dose. Call your doctor or health care professional if you are unable to keep an appointment. What may interact with this medication? Interactions have not been studied. This list may not describe all possible interactions. Give your health care provider a list of all the medicines, herbs, non-prescription drugs, or dietary supplements you use. Also tell them if you smoke, drink alcohol, or use illegal drugs. Some items may interact with your medicine. What should I watch for while using this medication? Your condition will be monitored carefully while you are receiving this medicine. You may need blood work done while you are taking this medicine. Do not become pregnant while taking this medicine or for at least 5 months after stopping it. Women should inform their doctor if they wish to become pregnant or think they might be pregnant. There is a potential for serious side effects to an unborn child. Talk to your health care professional or pharmacist for more information. Do not breast-feed an infant while taking this medicine or for at least 5 months  after the last dose. What side effects may I notice from receiving this medication? Side effects that you should report to your doctor or health care professional as soon as possible: allergic reactions like skin rash, itching or hives, swelling of the face, lips, or tongue black, tarry stools bloody or watery diarrhea breathing problems changes in vision chest pain or chest tightness chills facial flushing fever headache signs and symptoms of high blood sugar such as dizziness; dry mouth; dry skin; fruity breath; nausea; stomach pain; increased hunger or thirst; increased urination signs and symptoms of liver injury like dark yellow or brown urine; general ill feeling or flu-like symptoms; light-colored stools; loss of appetite; nausea; right upper belly pain; unusually weak or tired; yellowing of the eyes or skin stomach pain trouble passing urine or change in the amount of urine Side effects that usually do not require medical attention (report to your doctor or health care professional if they continue or are bothersome): bone pain cough diarrhea joint pain muscle pain muscle weakness swelling of arms or legs tiredness weight loss This list may not describe all possible side effects. Call your doctor for medical advice about side effects. You may report side effects to FDA at 1-800-FDA-1088. Where should I keep my medication? This drug is given in a hospital  or clinic and will not be stored at home. NOTE: This sheet is a summary. It may not cover all possible information. If you have questions about this medicine, talk to your doctor, pharmacist, or health care provider.  2023 Elsevier/Gold Standard (2021-10-16 00:00:00) Bevacizumab injection Noah Charon) What is this medication? BEVACIZUMAB (be va SIZ yoo mab) is a monoclonal antibody. It is used to treat many types of cancer. This medicine may be used for other purposes; ask your health care provider or pharmacist if you have  questions. COMMON BRAND NAME(S): Alymsys, Avastin, MVASI, Noah Charon What should I tell my care team before I take this medication? They need to know if you have any of these conditions: diabetes heart disease high blood pressure history of coughing up blood prior anthracycline chemotherapy (e.g., doxorubicin, daunorubicin, epirubicin) recent or ongoing radiation therapy recent or planning to have surgery stroke an unusual or allergic reaction to bevacizumab, hamster proteins, mouse proteins, other medicines, foods, dyes, or preservatives pregnant or trying to get pregnant breast-feeding How should I use this medication? This medicine is for infusion into a vein. It is given by a health care professional in a hospital or clinic setting. Talk to your pediatrician regarding the use of this medicine in children. Special care may be needed. Overdosage: If you think you have taken too much of this medicine contact a poison control center or emergency room at once. NOTE: This medicine is only for you. Do not share this medicine with others. What if I miss a dose? It is important not to miss your dose. Call your doctor or health care professional if you are unable to keep an appointment. What may interact with this medication? Interactions are not expected. This list may not describe all possible interactions. Give your health care provider a list of all the medicines, herbs, non-prescription drugs, or dietary supplements you use. Also tell them if you smoke, drink alcohol, or use illegal drugs. Some items may interact with your medicine. What should I watch for while using this medication? Your condition will be monitored carefully while you are receiving this medicine. You will need important blood work and urine testing done while you are taking this medicine. This medicine may increase your risk to bruise or bleed. Call your doctor or health care professional if you notice any unusual  bleeding. Before having surgery, talk to your health care provider to make sure it is ok. This drug can increase the risk of poor healing of your surgical site or wound. You will need to stop this drug for 28 days before surgery. After surgery, wait at least 28 days before restarting this drug. Make sure the surgical site or wound is healed enough before restarting this drug. Talk to your health care provider if questions. Do not become pregnant while taking this medicine or for 6 months after stopping it. Women should inform their doctor if they wish to become pregnant or think they might be pregnant. There is a potential for serious side effects to an unborn child. Talk to your health care professional or pharmacist for more information. Do not breast-feed an infant while taking this medicine and for 6 months after the last dose. This medicine has caused ovarian failure in some women. This medicine may interfere with the ability to have a child. You should talk to your doctor or health care professional if you are concerned about your fertility. What side effects may I notice from receiving this medication? Side effects that you should  report to your doctor or health care professional as soon as possible: allergic reactions like skin rash, itching or hives, swelling of the face, lips, or tongue chest pain or chest tightness chills coughing up blood high fever seizures severe constipation signs and symptoms of bleeding such as bloody or black, tarry stools; red or dark-brown urine; spitting up blood or brown material that looks like coffee grounds; red spots on the skin; unusual bruising or bleeding from the eye, gums, or nose signs and symptoms of a blood clot such as breathing problems; chest pain; severe, sudden headache; pain, swelling, warmth in the leg signs and symptoms of a stroke like changes in vision; confusion; trouble speaking or understanding; severe headaches; sudden numbness or  weakness of the face, arm or leg; trouble walking; dizziness; loss of balance or coordination stomach pain sweating swelling of legs or ankles vomiting weight gain Side effects that usually do not require medical attention (report to your doctor or health care professional if they continue or are bothersome): back pain changes in taste decreased appetite dry skin nausea tiredness This list may not describe all possible side effects. Call your doctor for medical advice about side effects. You may report side effects to FDA at 1-800-FDA-1088. Where should I keep my medication? This drug is given in a hospital or clinic and will not be stored at home. NOTE: This sheet is a summary. It may not cover all possible information. If you have questions about this medicine, talk to your doctor, pharmacist, or health care provider.  2023 Elsevier/Gold Standard (2021-10-16 00:00:00)

## 2022-06-29 NOTE — Progress Notes (Signed)
Verbal order from [name], [place] placement for chemotherapy.  Contact IR to have pt scheduled for Port-a-Cath placement.  LVM for IR Scheduler to contact Dr. Ernestina Penna office regarding scheduling pt's Port placement before her next schedule chemo.  Awaiting returned call.

## 2022-06-29 NOTE — Progress Notes (Signed)
Systolic BP recheck 85/69 ok to treat per MD Burr Medico.   Larene Beach, PharmD

## 2022-06-30 ENCOUNTER — Telehealth: Payer: BC Managed Care – PPO | Admitting: Hematology

## 2022-06-30 ENCOUNTER — Telehealth: Payer: Self-pay

## 2022-06-30 LAB — T4: T4, Total: 6.7 ug/dL (ref 4.5–12.0)

## 2022-06-30 NOTE — Telephone Encounter (Signed)
Pt LVM for someone from Dr. Ernestina Penna office to call her.  Pt did not indicate why she's requested a call back.  Called pt back and pt had a question regarding scheduling IVFs and Labs for next week.  Pt stated she will call scheduling to have that scheduled and pt confirmed her Port placement appt on Wednesday, 07/07/2022 with IR.

## 2022-07-01 ENCOUNTER — Telehealth: Payer: Self-pay | Admitting: Hematology

## 2022-07-01 NOTE — Telephone Encounter (Signed)
Left message with follow-up appointments per 8/1 los.

## 2022-07-02 ENCOUNTER — Other Ambulatory Visit: Payer: Self-pay

## 2022-07-05 ENCOUNTER — Telehealth: Payer: Self-pay

## 2022-07-05 NOTE — Telephone Encounter (Signed)
PT LVM stating her feet is extremely swollen and she's been taking Lasix (Furosemide) x 3days which does not seem to be helping.  Pt stated she's been elevating his legs and using cold packs on her legs but they do not seem to be working.  Pt requesting to speak with [name] because nothing is working with getting the swelling down in her feet.

## 2022-07-06 ENCOUNTER — Other Ambulatory Visit: Payer: Self-pay | Admitting: Student

## 2022-07-07 ENCOUNTER — Encounter (HOSPITAL_COMMUNITY): Payer: Self-pay

## 2022-07-07 ENCOUNTER — Ambulatory Visit (HOSPITAL_COMMUNITY)
Admission: RE | Admit: 2022-07-07 | Discharge: 2022-07-07 | Disposition: A | Discharge status: Home / Self Care | Payer: BC Managed Care – PPO | Source: Ambulatory Visit | Attending: Hematology | Admitting: Hematology

## 2022-07-07 DIAGNOSIS — C50111 Malignant neoplasm of central portion of right female breast: Secondary | ICD-10-CM | POA: Insufficient documentation

## 2022-07-07 DIAGNOSIS — Z452 Encounter for adjustment and management of vascular access device: Secondary | ICD-10-CM | POA: Diagnosis not present

## 2022-07-07 DIAGNOSIS — C220 Liver cell carcinoma: Secondary | ICD-10-CM | POA: Diagnosis not present

## 2022-07-07 DIAGNOSIS — Z170 Estrogen receptor positive status [ER+]: Secondary | ICD-10-CM | POA: Diagnosis not present

## 2022-07-07 DIAGNOSIS — C50911 Malignant neoplasm of unspecified site of right female breast: Secondary | ICD-10-CM | POA: Diagnosis not present

## 2022-07-07 HISTORY — PX: IR IMAGING GUIDED PORT INSERTION: IMG5740

## 2022-07-07 MED ORDER — FENTANYL CITRATE (PF) 100 MCG/2ML IJ SOLN
INTRAMUSCULAR | Status: AC
  Filled 2022-07-07: qty 2

## 2022-07-07 MED ORDER — MIDAZOLAM HCL 2 MG/2ML IJ SOLN
INTRAMUSCULAR | Status: AC | PRN
  Administered 2022-07-07: .5 mg via INTRAVENOUS
  Administered 2022-07-07: 1 mg via INTRAVENOUS

## 2022-07-07 MED ORDER — LIDOCAINE-EPINEPHRINE 1 %-1:100000 IJ SOLN
INTRAMUSCULAR | Status: AC | PRN
  Administered 2022-07-07: 20 mL

## 2022-07-07 MED ORDER — HEPARIN SOD (PORK) LOCK FLUSH 100 UNIT/ML IV SOLN
INTRAVENOUS | Status: AC
  Filled 2022-07-07: qty 5

## 2022-07-07 MED ORDER — SODIUM CHLORIDE 0.9 % IV SOLN: INTRAVENOUS | Status: DC

## 2022-07-07 MED ORDER — FENTANYL CITRATE (PF) 100 MCG/2ML IJ SOLN
INTRAMUSCULAR | Status: AC | PRN
  Administered 2022-07-07: 25 ug via INTRAVENOUS
  Administered 2022-07-07: 50 ug via INTRAVENOUS

## 2022-07-07 MED ORDER — LIDOCAINE HCL 1 % IJ SOLN
INTRAMUSCULAR | Status: AC
  Filled 2022-07-07: qty 20

## 2022-07-07 MED ORDER — HEPARIN SOD (PORK) LOCK FLUSH 100 UNIT/ML IV SOLN
INTRAVENOUS | Status: AC | PRN
  Administered 2022-07-07: 500 [IU] via INTRAVENOUS

## 2022-07-07 MED ORDER — MIDAZOLAM HCL 2 MG/2ML IJ SOLN
INTRAMUSCULAR | Status: AC
  Filled 2022-07-07: qty 2

## 2022-07-07 MED ORDER — LIDOCAINE HCL 1 % IJ SOLN
INTRAMUSCULAR | Status: AC | PRN
  Administered 2022-07-07: 5 mL

## 2022-07-07 MED ORDER — LIDOCAINE-EPINEPHRINE 1 %-1:100000 IJ SOLN
INTRAMUSCULAR | Status: AC
  Filled 2022-07-07: qty 1

## 2022-07-07 NOTE — Discharge Instructions (Signed)
Discharge Instructions:   Please call Interventional Radiology clinic [phone] with any questions or concerns.  You may remove your dressing and shower tomorrow.  Do not use EMLA / Lidocaine cream for 2 weeks post Port Insertion. May apply ice pack  to  right chest to help sooth discomfort   Moderate Conscious Sedation, Adult, Care After This sheet gives you information about how to care for yourself after your procedure. Your health care provider may also give you more specific instructions. If you have problems or questions, contact your health care provider. What can I expect after the procedure? After the procedure, it is common to have: Sleepiness for several hours. Impaired judgment for several hours. Difficulty with balance. Vomiting if you eat too soon. Follow these instructions at home: For the time period you were told by your health care provider:     Rest. Do not participate in activities where you could fall or become injured. Do not drive or use machinery. Do not drink alcohol. Do not take sleeping pills or medicines that cause drowsiness. Do not make important decisions or sign legal documents. Do not take care of children on your own. Eating and drinking  Follow the diet recommended by your health care provider. Drink enough fluid to keep your urine pale yellow. If you vomit: Drink water, juice, or soup when you can drink without vomiting. Make sure you have little or no nausea before eating solid foods. General instructions Take over-the-counter and prescription medicines only as told by your health care provider. Have a responsible adult stay with you for the time you are told. It is important to have someone help care for you until you are awake and alert. Do not smoke. Keep all follow-up visits as told by your health care provider. This is important. Contact a health care provider if: You are still sleepy or having trouble with balance after 24  hours. You feel light-headed. You keep feeling nauseous or you keep vomiting. You develop a rash. You have a fever. You have redness or swelling around the IV site. Get help right away if: You have trouble breathing. You have new-onset confusion at home. Summary After the procedure, it is common to feel sleepy, have impaired judgment, or feel nauseous if you eat too soon. Rest after you get home. Know the things you should not do after the procedure. Follow the diet recommended by your health care provider and drink enough fluid to keep your urine pale yellow. Get help right away if you have trouble breathing or new-onset confusion at home. This information is not intended to replace advice given to you by your health care provider. Make sure you discuss any questions you have with your health care provider. Document Revised: 03/14/2020 Document Reviewed: 10/11/2019 Elsevier Patient Education  [place] Insertion, Care After The following information offers guidance on how to care for yourself after your procedure. Your health care provider may also give you more specific instructions. If you have problems or questions, contact your health care provider. What can I expect after the procedure? After the procedure, it is common to have: Discomfort at the port insertion site. Bruising on the skin over the port. This should improve over 3-4 days. Follow these instructions at home: [facility] care After your port is placed, you will get a manufacturer's information card. The card has information about your port. Keep this card with you at all times. Take care of the port as told  by your health care provider. Ask your health care provider if you or a family member can get training for taking care of the port at home. A home health care nurse will be be available to help care for the port. Make sure to remember what type of port you have. Incision care     Follow  instructions from your health care provider about how to take care of your port insertion site. Make sure you: Wash your hands with soap and water for at least 20 seconds before and after you change your bandage (dressing). If soap and water are not available, use hand sanitizer. Change your dressing as told by your health care provider. Leave stitches (sutures), skin glue, or adhesive strips in place. These skin closures may need to stay in place for 2 weeks or longer. If adhesive strip edges start to loosen and curl up, you may trim the loose edges. Do not remove adhesive strips completely unless your health care provider tells you to do that. Check your port insertion site every day for signs of infection. Check for: Redness, swelling, or pain. Fluid or blood. Warmth. Pus or a bad smell. Activity Return to your normal activities as told by your health care provider. Ask your health care provider what activities are safe for you. You may have to avoid lifting. Ask your health care provider how much you can safely lift. General instructions Take over-the-counter and prescription medicines only as told by your health care provider. Do not take baths, swim, or use a hot tub until your health care provider approves. Ask your health care provider if you may take showers. You may only be allowed to take sponge baths. If you were given a sedative during the procedure, it can affect you for several hours. Do not drive or operate machinery until your health care provider says that it is safe. Wear a medical alert bracelet in case of an emergency. This will tell any health care providers that you have a port. Keep all follow-up visits. This is important. Contact a health care provider if: You cannot flush your port with saline as directed, or you cannot draw blood from the port. You have a fever or chills.  Call for fever 101 or greater. You have redness, swelling, or pain around your port insertion  site. You have fluid or blood coming from your port insertion site. Your port insertion site feels warm to the touch. You have pus or a bad smell coming from the port insertion site. Get help right away if: You have chest pain or shortness of breath. You have bleeding from your port that you cannot control. These symptoms may be an emergency. Get help right away. Call 911. Do not wait to see if the symptoms will go away. Do not drive yourself to the hospital. Summary Take care of the port as told by your health care provider. Keep the manufacturer's information card with you at all times. Change your dressing as told by your health care provider. Contact a health care provider if you have a fever or chills or if you have redness, swelling, or pain around your port insertion site. Keep all follow-up visits. This information is not intended to replace advice given to you by your health care provider. Make sure you discuss any questions you have with your health care provider. Document Revised: 05/19/2021 Document Reviewed: 05/19/2021 Elsevier Patient Education  [place].

## 2022-07-07 NOTE — Progress Notes (Signed)
1600 Ice pack sent home with /[facility] to apply to her port site for discomfort.

## 2022-07-07 NOTE — H&P (Signed)
Chief Complaint: Patient was seen in consultation today for hepatocellular carcinoma  Referring Physician(s): Feng,Yan  Supervising Physician: Malachy Moan  Patient Status: [facility] - Out-pt  History of Present Illness: Theresa Rocha is a 56 y.o. female with Hep C and cirrhosis known to IR from thermal ablation of left lobe lesion 09/16/21, and Y90 radioembolization of a right-sided liver lesion 12/22/21.  She is also s/p left-sided PleurX placement due to recurrent left pleural effusions.  She has had paracentesis in the past, her most recent paracetnesis was 03/04/22 prior to PleurX placement.  She is receiving immunotherapy at the cancer center with increasing difficulty getting IV access for administration. She is in need of durable venous access and has been referred to IR for Port-A-Cath placement.   Patient presents to [facility] Radiology today in her usual state of health.  She has been NPO.  She reports severe pain related to her PleurX catheter, however reports she continues to get 250-500 mL output daily. This is a decrease from her usual of 1L/day, however she recently developed LE pitting edema and was started on Lasix 3-4 days ago. She questions whether decrease in her PleurX output is related to accumulation of fluid in her abdomen and lower extremities.   Past Medical History:  Diagnosis Date   Anemia    Breast cancer (HCC) 08/30/2019   Cancer (HCC)    liver- just diagnosed, not started treatment yet   Cervicalgia    COPD (chronic obstructive pulmonary disease) (HCC)    Esophageal varices (HCC)    GERD (gastroesophageal reflux disease)    Headache    Hepatic cirrhosis (HCC)    Hepatitis C    resolved 2018   Hepatocellular carcinoma (HCC)    IBS (irritable bowel syndrome)    Lactose intolerance    Personal history of radiation therapy 11/2019   Right breast   Portal hypertension (HCC)    Raynaud's syndrome    Thrombocytopenia (HCC)     Past Surgical History:   Procedure Laterality Date   BREAST BIOPSY Right 09/2019   BREAST LUMPECTOMY Right 09/2019   BREAST LUMPECTOMY WITH AXILLARY LYMPH NODE BIOPSY Right 10/16/2019   Procedure: RIGHT BREAST LUMPECTOMY WITH SENTINEL LYMPH NODE BIOPSY;  Surgeon: Almond Lint, MD;  Location: [facility] OR;  Service: General;  Laterality: Right;   COLONOSCOPY     ESOPHAGEAL BANDING  10/27/2021   Procedure: ESOPHAGEAL BANDING;  Surgeon: Hilarie Fredrickson, MD;  Location: [facility] ENDOSCOPY;  Service: Endoscopy;;   ESOPHAGOGASTRODUODENOSCOPY (EGD) WITH PROPOFOL N/A 10/27/2021   Procedure: ESOPHAGOGASTRODUODENOSCOPY (EGD) WITH PROPOFOL;  Surgeon: Hilarie Fredrickson, MD;  Location: [facility] ENDOSCOPY;  Service: Endoscopy;  Laterality: N/A;   ESOPHAGOGASTRODUODENOSCOPY (EGD) WITH PROPOFOL N/A 01/04/2022   Procedure: ESOPHAGOGASTRODUODENOSCOPY (EGD) WITH PROPOFOL;  Surgeon: Hilarie Fredrickson, MD;  Location: [facility] ENDOSCOPY;  Service: Endoscopy;  Laterality: N/A;   HAMMER TOE SURGERY Left 2012   pins placed in great toe, 2nd,3rd, 4th toes, all pins removed except great toe   IR ANGIOGRAM SELECTIVE EACH ADDITIONAL VESSEL  12/16/2021   IR ANGIOGRAM SELECTIVE EACH ADDITIONAL VESSEL  12/16/2021   IR ANGIOGRAM SELECTIVE EACH ADDITIONAL VESSEL  12/22/2021   IR ANGIOGRAM VISCERAL SELECTIVE  12/16/2021   IR ANGIOGRAM VISCERAL SELECTIVE  12/22/2021   IR EMBO TUMOR ORGAN ISCHEMIA INFARCT INC GUIDE ROADMAPPING  12/22/2021   IR PARACENTESIS  11/13/2019   IR PARACENTESIS  03/04/2022   IR PATIENT EVAL TECH 0-60 MINS  05/19/2022   IR PERC PLEURAL DRAIN W/INDWELL CATH  W/IMG GUIDE  03/09/2022   IR RADIOLOGIST EVAL & MGMT  08/16/2019   IR RADIOLOGIST EVAL & MGMT  02/06/2020   IR RADIOLOGIST EVAL & MGMT  06/26/2020   IR RADIOLOGIST EVAL & MGMT  12/31/2020   IR RADIOLOGIST EVAL & MGMT  07/21/2021   IR RADIOLOGIST EVAL & MGMT  10/08/2021   IR RADIOLOGIST EVAL & MGMT  01/13/2022   IR RADIOLOGIST EVAL & MGMT  03/05/2022   IR THORACENTESIS ASP PLEURAL SPACE W/IMG GUIDE  10/12/2021   IR  THORACENTESIS ASP PLEURAL SPACE W/IMG GUIDE  10/26/2021   IR THORACENTESIS ASP PLEURAL SPACE W/IMG GUIDE  11/19/2021   IR THORACENTESIS ASP PLEURAL SPACE W/IMG GUIDE  12/22/2021   IR US GUIDE VASC ACCESS RIGHT  12/16/2021   IR US GUIDE VASC ACCESS RIGHT  12/22/2021   PELVIC LAPAROSCOPY Bilateral 1992   RADIOLOGY WITH ANESTHESIA N/A 09/16/2021   Procedure: CT MICROWAVE ABLATION;  Surgeon: Aletta Edouard, MD;  Location: [facility] ORS;  Service: Radiology;  Laterality: N/A;   UPPER GASTROINTESTINAL ENDOSCOPY  2002   had esophageal dilitation    Allergies: Hydromorphone hcl and Aspirin  Medications: Prior to Admission medications   Medication Sig Start Date End Date Taking? Authorizing Provider  baclofen (LIORESAL) 10 MG tablet Take 5 mg by mouth daily as needed for muscle spasms. 03/12/22  Yes [provider]  dicyclomine (BENTYL) 20 MG tablet Take 0.5 tablets (10 mg total) by mouth every 6 (six) hours as needed for spasms. Take one tablet every 6-8 hours as needed for abdominal cramping Patient taking differently: Take 20 mg by mouth daily as needed (abdominal cramping). 01/07/22  Yes Noralyn Pick, NP  lactulose (CHRONULAC) 10 GM/15ML solution Take 15 mLs (10 g total) by mouth 2 (two) times daily as needed for mild constipation. 05/03/22 08/01/22 Yes Dahal, Marlowe Aschoff, MD  midodrine (PROAMATINE) 5 MG tablet Take 3 tablets (15 mg total) by mouth 3 (three) times daily with meals. 05/03/22 08/01/22 Yes Dahal, Marlowe Aschoff, MD  nicotine (NICODERM CQ - DOSED IN MG/24 HOURS) 21 mg/24hr patch Place 21 mg onto the skin daily as needed (smoking cessation on work days).   Yes [provider]  oxyCODONE (OXY IR/ROXICODONE) 5 MG immediate release tablet Take 1-2 tablets (5-10 mg total) by mouth every 4 (four) hours as needed for severe pain. Patient taking differently: Take 5 mg by mouth daily as needed (pain). 05/24/22  Yes Truitt Merle, MD  pantoprazole (PROTONIX) 40 MG tablet Take 1 tablet (40 mg total)  by mouth daily. 12/01/21  Yes Noralyn Pick, NP  Prenatal Vit-Fe Fumarate-FA (PRENATAL PO) Take 1 tablet by mouth daily.   Yes [provider]  prochlorperazine (COMPAZINE) 10 MG tablet Take 1 tablet (10 mg total) by mouth every 6 (six) hours as needed for nausea or vomiting. 06/09/22  Yes Truitt Merle, MD  promethazine (PHENERGAN) 12.5 MG tablet Take 1 tablet (12.5 mg total) by mouth daily as needed for nausea or vomiting. 06/28/22  Yes Truitt Merle, MD  sodium chloride 1 g tablet Take 1 tablet (1 g total) by mouth 2 (two) times daily with a meal. 06/02/22  Yes Danford, Suann Larry, MD  tamoxifen (NOLVADEX) 20 MG tablet TAKE 1 TABLET BY MOUTH EVERY DAY Patient taking differently: Take 20 mg by mouth every evening. 10/13/20  Yes Truitt Merle, MD  gabapentin (NEURONTIN) 300 MG capsule Take 1 capsule (300 mg total) by mouth at bedtime. 05/19/22   Truitt Merle, MD  Family History  Problem Relation Age of Onset   Liver cancer Mother    Lung cancer Mother    Lung cancer Father    Liver cancer Maternal Grandmother    Colon cancer Neg Hx    Esophageal cancer Neg Hx    Stomach cancer Neg Hx    Rectal cancer Neg Hx     Social History   Socioeconomic History   Marital status: Married    Spouse name: Not on file   Number of children: Not on file   Years of education: Not on file   Highest education level: Not on file  Occupational History   Not on file  Tobacco Use   Smoking status: Every Day    Packs/day: 0.50    Years: 30.00    Total pack years: 15.00    Types: Cigarettes    Start date: 11/29/1978   Smokeless tobacco: Never  Vaping Use   Vaping Use: Never used  Substance and Sexual Activity   Alcohol use: Not Currently    Alcohol/week: 6.0 standard drinks of alcohol    Types: 6 Cans of beer per week    Comment: stopped 06/2019   Drug use: No   Sexual activity: Not Currently    Partners: Male  Other Topics Concern   Not on file  Social History Narrative   Not on file    Social Determinants of Health   Financial Resource Strain: Not on file  Food Insecurity: Not on file  Transportation Needs: Not on file  Physical Activity: Not on file  Stress: Not on file  Social Connections: Not on file     Review of Systems: A 12 point ROS discussed and pertinent positives are indicated in the HPI above.  All other systems are negative.  Review of Systems  Constitutional:  Negative for fatigue and fever.  Respiratory:  Negative for cough and shortness of breath.   Cardiovascular:  Negative for chest pain.  Gastrointestinal:  Negative for abdominal pain.  Genitourinary:  Negative for dysuria.  Musculoskeletal:  Negative for back pain.  Psychiatric/Behavioral:  Negative for behavioral problems and confusion.     Vital Signs: BP 110/66   Pulse 95   Temp 98.3 F (36.8 C) (Oral)   Resp 16   LMP 04/20/2013   SpO2 97%   Physical Exam Vitals and nursing note reviewed.  Constitutional:      General: She is not in acute distress.    Appearance: Normal appearance. She is not ill-appearing.  HENT:     Mouth/Throat:     Mouth: Mucous membranes are moist.     Pharynx: Oropharynx is clear.  Cardiovascular:     Rate and Rhythm: Normal rate and regular rhythm.  Pulmonary:     Effort: Pulmonary effort is normal.     Breath sounds: Normal breath sounds.  Abdominal:     General: There is distension (mild).     Tenderness: There is no abdominal tenderness.  Skin:    General: Skin is warm and dry.     Comments: Left-sided PleurX in place. Dressing intact.  Neurological:     General: No focal deficit present.     Mental Status: She is alert and oriented to person, place, and time. Mental status is at baseline.  Psychiatric:        Mood and Affect: Mood normal.        Behavior: Behavior normal.        Thought Content: Thought content normal.  Judgment: Judgment normal.      MD Evaluation Airway: WNL Heart: WNL Abdomen: WNL Chest/ Lungs:  WNL ASA  Classification: 3 Mallampati/Airway Score: Two   Imaging: No results found.  Labs:  CBC: Recent Labs    05/31/22 1726 06/02/22 0042 06/09/22 1407 06/29/22 1343  WBC 11.3* 10.2 10.1 14.2*  HGB 9.8* 9.8* 10.5* 10.1*  HCT 30.7* 31.7* 32.6* 31.5*  PLT 128* 122* 143* 115*    COAGS: Recent Labs    10/26/21 2001 10/27/21 0429 12/22/21 0803 02/21/22 1055 04/16/22 0457 06/01/22 1032  INR 1.8*   < > 1.3* 1.6* 1.7* 1.5*  APTT 31  --   --   --   --   --    < > = values in this interval not displayed.    BMP: Recent Labs    06/01/22 2057 06/02/22 0042 06/09/22 1407 06/29/22 1343  NA 126* 127* 125* 127*  K 5.4* 5.5* 5.5* 4.5  CL 106 106 101 103  CO2 15* 17* 19* 19*  GLUCOSE 130* 127* 122* 114*  BUN 19 19 22* 50*  CALCIUM 7.2* 7.6* 8.2* 7.9*  CREATININE 0.66 0.64 0.74 1.33*  GFRNONAA >60 >60 >60 47*    LIVER FUNCTION TESTS: Recent Labs    05/11/22 1105 05/31/22 1726 06/09/22 1407 06/29/22 1343  BILITOT 1.7* 1.1 1.4* 0.8  AST 41 41 42* 40  ALT $Re'21 25 30 'UOy$ 32  ALKPHOS 71 81 84 77  PROT 6.5 6.1* 5.9* 5.9*  ALBUMIN 3.6 2.9* 2.5* 2.7*    TUMOR MARKERS: No results for input(s): "AFPTM", "CEA", "CA199", "CHROMGRNA" in the last 8760 hours.  Assessment and Plan: Patient with past medical history of right-sided breast cancer s/p treatment, [place] s/p ablation as well as Y90 radioembolization presents with complaint of poor venous access.  IR consulted for Port-A-Cath placement at the request of [name]. Case reviewed by Dr. Laurence Ferrari who approves patient for procedure.  Patient presents today in their usual state of health.  She has been NPO and is not currently on blood thinners.   Dr. Laurence Ferrari has also met with patient at bedside to discuss her complains related to PleurX pain.  Offered assessment under fluoroscopy with possible revision/shortening, however patient politely declines stating she is currently uncomfortable but managing well. She does  request a paracentesis.  Visualization for possible ascites with [place] during procedure today, possible paracentesis.   Risks and benefits of image guided port-a-catheter placement was discussed with the patient including, but not limited to bleeding, infection, pneumothorax, or fibrin sheath development and need for additional procedures.  All of the patient's questions were answered, patient is agreeable to proceed. Consent signed and in chart.   Thank you for this interesting consult.  I greatly enjoyed meeting Theresa Rocha and look forward to participating in their care.  A copy of this report was sent to the requesting provider on this date.  Electronically Signed: Docia Barrier, PA 07/07/2022, 2:15 PM   I spent a total of  30 Minutes   in face to face in clinical consultation, greater than 50% of which was counseling/coordinating care for hepatocellular carcinoma.

## 2022-07-07 NOTE — Procedures (Signed)
Interventional Radiology Procedure Note  Procedure: Placement of a right IJ approach single lumen PowerPort.  Tip is positioned at the superior cavoatrial junction and catheter is ready for immediate use.  Complications: No immediate Recommendations:  - Ok to shower tomorrow - Do not submerge for 7 days - Routine line care   Signed,  Tylynn Braniff K. Odean Fester, MD   

## 2022-07-09 ENCOUNTER — Other Ambulatory Visit: Payer: Self-pay

## 2022-07-09 ENCOUNTER — Inpatient Hospital Stay (HOSPITAL_BASED_OUTPATIENT_CLINIC_OR_DEPARTMENT_OTHER): Payer: BC Managed Care – PPO | Admitting: Hematology

## 2022-07-09 ENCOUNTER — Ambulatory Visit (INDEPENDENT_AMBULATORY_CARE_PROVIDER_SITE_OTHER): Payer: BC Managed Care – PPO | Admitting: Thoracic Surgery (Cardiothoracic Vascular Surgery)

## 2022-07-09 ENCOUNTER — Inpatient Hospital Stay: Payer: BC Managed Care – PPO

## 2022-07-09 VITALS — BP 104/59 | HR 102 | Temp 97.7°F | Resp 16

## 2022-07-09 DIAGNOSIS — C50111 Malignant neoplasm of central portion of right female breast: Secondary | ICD-10-CM

## 2022-07-09 DIAGNOSIS — Z95828 Presence of other vascular implants and grafts: Secondary | ICD-10-CM

## 2022-07-09 DIAGNOSIS — K7460 Unspecified cirrhosis of liver: Secondary | ICD-10-CM

## 2022-07-09 DIAGNOSIS — R180 Malignant ascites: Secondary | ICD-10-CM | POA: Diagnosis not present

## 2022-07-09 DIAGNOSIS — Z170 Estrogen receptor positive status [ER+]: Secondary | ICD-10-CM

## 2022-07-09 DIAGNOSIS — C220 Liver cell carcinoma: Secondary | ICD-10-CM

## 2022-07-09 DIAGNOSIS — Z79899 Other long term (current) drug therapy: Secondary | ICD-10-CM | POA: Diagnosis not present

## 2022-07-09 DIAGNOSIS — E871 Hypo-osmolality and hyponatremia: Secondary | ICD-10-CM | POA: Diagnosis not present

## 2022-07-09 DIAGNOSIS — Z5112 Encounter for antineoplastic immunotherapy: Secondary | ICD-10-CM | POA: Diagnosis not present

## 2022-07-09 DIAGNOSIS — I959 Hypotension, unspecified: Secondary | ICD-10-CM | POA: Diagnosis not present

## 2022-07-09 DIAGNOSIS — C50919 Malignant neoplasm of unspecified site of unspecified female breast: Secondary | ICD-10-CM | POA: Diagnosis not present

## 2022-07-09 DIAGNOSIS — Z5111 Encounter for antineoplastic chemotherapy: Secondary | ICD-10-CM | POA: Diagnosis not present

## 2022-07-09 LAB — CMP (CANCER CENTER ONLY)
ALT: 23 U/L (ref 0–44)
AST: 35 U/L (ref 15–41)
Albumin: 2.5 g/dL — ABNORMAL LOW (ref 3.5–5.0)
Alkaline Phosphatase: 74 U/L (ref 38–126)
Anion gap: 5 (ref 5–15)
BUN: 19 mg/dL (ref 6–20)
CO2: 25 mmol/L (ref 22–32)
Calcium: 7.7 mg/dL — ABNORMAL LOW (ref 8.9–10.3)
Chloride: 99 mmol/L (ref 98–111)
Creatinine: 0.73 mg/dL (ref 0.44–1.00)
GFR, Estimated: >60 mL/min (ref >60)
Glucose, Bld: 106 mg/dL — ABNORMAL HIGH (ref 70–99)
Potassium: 3.7 mmol/L (ref 3.5–5.1)
Sodium: 129 mmol/L — ABNORMAL LOW (ref 135–145)
Total Bilirubin: 1.8 mg/dL — ABNORMAL HIGH (ref 0.3–1.2)
Total Protein: 5.6 g/dL — ABNORMAL LOW (ref 6.5–8.1)

## 2022-07-09 LAB — CBC WITH DIFFERENTIAL (CANCER CENTER ONLY)
Abs Immature Granulocytes: 0.03 10*3/uL (ref 0.00–0.07)
Basophils Absolute: 0.1 10*3/uL (ref 0.0–0.1)
Basophils Relative: 1 %
Eosinophils Absolute: 0.1 10*3/uL (ref 0.0–0.5)
Eosinophils Relative: 2 %
HCT: 31.2 % — ABNORMAL LOW (ref 36.0–46.0)
Hemoglobin: 10.3 g/dL — ABNORMAL LOW (ref 12.0–15.0)
Immature Granulocytes: 0 %
Lymphocytes Relative: 10 %
Lymphs Abs: 0.7 10*3/uL (ref 0.7–4.0)
MCH: 25.1 pg — ABNORMAL LOW (ref 26.0–34.0)
MCHC: 33 g/dL (ref 30.0–36.0)
MCV: 76.1 fL — ABNORMAL LOW (ref 80.0–100.0)
Monocytes Absolute: 0.9 10*3/uL (ref 0.1–1.0)
Monocytes Relative: 13 %
Neutro Abs: 5.3 10*3/uL (ref 1.7–7.7)
Neutrophils Relative %: 74 %
Platelet Count: 85 10*3/uL — ABNORMAL LOW (ref 150–400)
RBC: 4.1 MIL/uL (ref 3.87–5.11)
RDW: 18.9 % — ABNORMAL HIGH (ref 11.5–15.5)
WBC Count: 7.1 10*3/uL (ref 4.0–10.5)
nRBC: 0 % (ref 0.0–0.2)

## 2022-07-09 LAB — TSH: TSH: 3.803 u[IU]/mL (ref 0.350–4.500)

## 2022-07-09 LAB — TOTAL PROTEIN, URINE DIPSTICK: Protein, ur: NEGATIVE mg/dL

## 2022-07-09 MED ORDER — SODIUM CHLORIDE 0.9% FLUSH
10.0000 mL | INTRAVENOUS | Status: AC | PRN
  Administered 2022-07-09: 10 mL

## 2022-07-09 MED ORDER — HEPARIN SOD (PORK) LOCK FLUSH 100 UNIT/ML IV SOLN: 500.0000 [IU] | Freq: Once | INTRAVENOUS | Status: DC | PRN

## 2022-07-09 MED ORDER — PROMETHAZINE HCL 12.5 MG PO TABS: 12.5000 mg | ORAL_TABLET | Freq: Every day | ORAL | 0 refills | Status: DC | PRN

## 2022-07-09 MED ORDER — SODIUM CHLORIDE 0.9 % IV SOLN: Freq: Once | INTRAVENOUS | Status: AC

## 2022-07-09 MED ORDER — SODIUM CHLORIDE 0.9% FLUSH: 10.0000 mL | Freq: Once | INTRAVENOUS | Status: DC | PRN

## 2022-07-09 MED ORDER — OXYCODONE HCL 5 MG PO TABS: 5.0000 mg | ORAL_TABLET | Freq: Four times a day (QID) | ORAL | 0 refills | Status: DC | PRN

## 2022-07-09 NOTE — Progress Notes (Signed)
     [address]       [place] [address]             [phone]       Patient: Home Provider: Office Consent for Telemedicine visit obtained.  Today's visit was completed via a real-time telehealth (see specific modality noted below). The patient/authorized person provided oral consent at the time of the visit to engage in a telemedicine encounter with the present provider at [facility]. The patient/authorized person was informed of the potential benefits, limitations, and risks of telemedicine. The patient/authorized person expressed understanding that the laws that protect confidentiality also apply to telemedicine. The patient/authorized person acknowledged understanding that telemedicine does not provide emergency services and that he or she would need to call 911 or proceed to the nearest hospital for help if such a need arose.   Total time spent in the clinical discussion 10 minutes.  Telehealth Modality: Phone visit (audio only)  I had a telephone visit with Mrs. Theresa Rocha.  She continues to drain a liter a day.  She was draining twice a day, but has been able to maintain things at a single drainage.  I will touch base with her again in 4 weeks.  Once her output is less than 200 with drainage she can go to every other day.

## 2022-07-09 NOTE — Patient Instructions (Signed)
Rehydration, Adult Rehydration is the replacement of body fluids, salts, and minerals (electrolytes) that are lost during dehydration. Dehydration is when there is not enough water or other fluids in the body. This happens when you lose more fluids than you take in. Common causes of dehydration include: Not drinking enough fluids. This can occur when you are ill or doing activities that require a lot of energy, especially in hot weather. Conditions that cause loss of water or other fluids, such as diarrhea, vomiting, sweating, or urinating a lot. Other illnesses, such as fever or infection. Certain medicines, such as those that remove excess fluid from the body (diuretics). Symptoms of mild or moderate dehydration may include thirst, dry lips and mouth, and dizziness. Symptoms of severe dehydration may include increased heart rate, confusion, fainting, and not urinating. For severe dehydration, you may need to get fluids through an IV at the hospital. For mild or moderate dehydration, you can usually rehydrate at home by drinking certain fluids as told by your health care provider. What are the risks? Generally, rehydration is safe. However, taking in too much fluid (overhydration) can be a problem. This is rare. Overhydration can cause an electrolyte imbalance, kidney failure, or a decrease in salt (sodium) levels in the body. Supplies needed You will need an oral rehydration solution (ORS) if your health care provider tells you to use one. This is a drink to treat dehydration. It can be found in pharmacies and retail stores. How to rehydrate Fluids Follow instructions from your health care provider for rehydration. The kind of fluid and the amount you should drink depend on your condition. In general, you should choose drinks that you prefer. If told by your health care provider, drink an ORS. Make an ORS by following instructions on the package. Start by drinking small amounts, about  cup (120  mL) every 5-10 minutes. Slowly increase how much you drink until you have taken the amount recommended by your health care provider. Drink enough clear fluids to keep your urine pale yellow. If you were told to drink an ORS, finish it first, then start slowly drinking other clear fluids. Drink fluids such as: Water. This includes sparkling water and flavored water. Drinking only water can lead to having too little sodium in your body (hyponatremia). Follow the advice of your health care provider. Water from ice chips you suck on. Fruit juice with water you add to it (diluted). Sports drinks. Hot or cold herbal teas. Broth-based soups. Milk or milk products. Food Follow instructions from your health care provider about what to eat while you rehydrate. Your health care provider may recommend that you slowly begin eating regular foods in small amounts. Eat foods that contain a healthy balance of electrolytes, such as bananas, oranges, potatoes, tomatoes, and spinach. Avoid foods that are greasy or contain a lot of sugar. In some cases, you may get nutrition through a feeding tube that is passed through your nose and into your stomach (nasogastric tube, or NG tube). This may be done if you have uncontrolled vomiting or diarrhea. Beverages to avoid  Certain beverages may make dehydration worse. While you rehydrate, avoid drinking alcohol. How to tell if you are recovering from dehydration You may be recovering from dehydration if: You are urinating more often than before you started rehydrating. Your urine is pale yellow. Your energy level improves. You vomit less frequently. You have diarrhea less frequently. Your appetite improves or returns to normal. You feel less dizzy or less light-headed.   Your skin tone and color start to look more normal. Follow these instructions at home: Take over-the-counter and prescription medicines only as told by your health care provider. Do not take sodium  tablets. Doing this can lead to having too much sodium in your body (hypernatremia). Contact a health care provider if: You continue to have symptoms of mild or moderate dehydration, such as: Thirst. Dry lips. Slightly dry mouth. Dizziness. Dark urine or less urine than normal. Muscle cramps. You continue to vomit or have diarrhea. Get help right away if you: Have symptoms of dehydration that get worse. Have a fever. Have a severe headache. Have been vomiting and the following happens: Your vomiting gets worse or does not go away. Your vomit includes blood or green matter (bile). You cannot eat or drink without vomiting. Have problems with urination or bowel movements, such as: Diarrhea that gets worse or does not go away. Blood in your stool (feces). This may cause stool to look black and tarry. Not urinating, or urinating only a small amount of very dark urine, within 6-8 hours. Have trouble breathing. Have symptoms that get worse with treatment. These symptoms may represent a serious problem that is an emergency. Do not wait to see if the symptoms will go away. Get medical help right away. Call your local emergency services (911 in the [place] Do not drive yourself to the hospital. Summary Rehydration is the replacement of body fluids and minerals (electrolytes) that are lost during dehydration. Follow instructions from your health care provider for rehydration. The kind of fluid and amount you should drink depend on your condition. Slowly increase how much you drink until you have taken the amount recommended by your health care provider. Contact your health care provider if you continue to show signs of mild or moderate dehydration. This information is not intended to replace advice given to you by your health care provider. Make sure you discuss any questions you have with your health care provider. Document Revised: 01/16/2020 Document Reviewed: 11/26/2019 Elsevier Patient  Education  2023 [employer]  

## 2022-07-09 NOTE — Progress Notes (Addendum)
[facility]   Telephone:([phone]) [phone] Fax:([phone]) [phone]   Clinic Follow up Note   Patient Care Team: Seward Carol, MD as PCP - General (Internal Medicine) Arna Snipe, RN as Oncology Nurse Navigator Irene Shipper, MD as Consulting Physician (Gastroenterology) Truitt Merle, MD as Consulting Physician (Hematology) Alla Feeling, NP as Nurse Practitioner (Nurse Practitioner) Stark Klein, MD as Consulting Physician (General Surgery) Aletta Edouard, MD as Consulting Physician (Interventional Radiology) Roosevelt Locks, CRNP as Nurse Practitioner (Nurse Practitioner) Mauro Kaufmann, RN as Oncology Nurse Navigator Rockwell Germany, RN as Oncology Nurse Navigator Eppie Gibson, MD as Attending Physician (Radiation Oncology) Trula Slade, DPM as Consulting Physician (Podiatry)  Date of Service:  07/09/2022  CHIEF COMPLAINT: f/u of multifocal [place], right breast cancer  CURRENT THERAPY:  Tecentriq/Bevacizumab, q21d, starting 06/29/22  ASSESSMENT & PLAN:  Theresa Rocha is a 56 y.o. female with   1. Multifocal Hepatocellular carcinoma, cT2N0Mx, stage II -presented with abdominal pain and rectal bleeding in 06/2019. Work up scans showed multifocal lesions of liver consistent with [place] with indeterminate right lobe lesions. AFP was WNL. -not a candidate for transplant or surgery -s/p left lobe ablation on 09/16/21, and right lobe Y90 on 12/22/21 with Dr. Kathlene Cote. -Liver MRI from 06/06/22 showed cancer progression of on previously treated lesion in segment 7 with extra capsular extension and adenopathy, consistent with disease progression. Recommendation is for systemic treatment. -She began Tecentriq and bevacizumab on 06/29/22, she tolerated first cycle treatment well.  She overall feels better after treatment.   2. Recurrent left Pleural Effusion -occurred following left lobe liver ablation for [facility] on 09/16/21 -cytology from several thoracentesis were all negative. -PET scan  12/15/21 negative for thoracic metastasis -she required PleurX catheter placement 03/09/22.  -per Dr. Kipp Brood, she is not a candidate for pleurodesis -abdomen MRI 06/06/22 showed nodularity in the left pleura, concerning for malignancy -she is scheduled for [place] PET scan on 08/13/22 due to her recent tamoxifen use, which we have stopped it.    3. Hyponatremia, Hypotension  -Refractory, secondary to her liver cirrhosis and high output from thoracic Pleurx -I encourage her to drink more fluids    4. 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 Due to knee joint pain, she was switched to Tamoxifen in 03/2020. She is tolerating with hot flashes.  -most recent mammogram from 05/13/22 showed breast calcification in right side. Biopsy was recommended, however patient would like to hold for now due to her weakness. -Tamoxifen held since 06/15/22 for work up of recurrent PE   5. 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  -s/p harvoni and ribavirin treatment for Hep C per Dr. Bradd Burner in 2018 -EGD on 10/27/21 by Dr. Henrene Pastor while hospitalized showed esophageal varices, which were banded at that time. No residual varices on repeat EGD on 01/04/22. -Continue to F/u with Dr Henrene Pastor and Roosevelt Locks   6. Alcohol and smoking Cessation  -She has h/o smoking 0.5 PPD x35 years. She drank 3-4 cans beer nightly x20 years. -Cessation of both was discussed and strongly encouraged. She has stopped drinking alcohol but smokes 5 cigarettes a day still. I encouraged her to continue reducing until she quits completely.    7. Osteoporosis, Hypocalcemia  -  DEXA from 09/21/19 at her GYN office showed osteoporosis T score -3.1 at [facility].   -I  started her on Zometa q75month for 2 years on 07/17/20. Held before 3rd dose on 05/01/21, due to mouth sores. She has full sets of dentures and has not seen dentist in years.   8. Thrombocytopenia, [place] -secondary to liver cirrhosis, splenomegaly, and/or varices    PLAN:  -IVF today -I refilled oxycodone and phenergan -lab, flush, f/u, and tecentriq/beva 8/21 -[place] PET 9/15   No problem-specific Assessment & Plan notes found for this encounter.   SUMMARY OF ONCOLOGIC HISTORY: Oncology History Overview Note  Cancer Staging Hepatocellular carcinoma ([facility] Staging form: Liver, AJCC 8th Edition - Clinical stage from 08/02/2019: Stage II (cT2, cN0, cM0) - Signed by FTruitt Merle MD on 08/02/2019  Malignant neoplasm of central portion of right breast in female, estrogen receptor positive ([place] Staging form: Breast, AJCC 8th Edition - Clinical stage from 08/29/2019: Stage IB (cT2, cN0, cM0, G2, ER+, PR+, HER2-) - Signed by FTruitt Merle MD on 09/05/2019    Hepatocellular carcinoma ([facility]  07/04/2019 Imaging   CT [facility] IMPRESSION: 1. Findings of cirrhosis and hepatomegaly.  Patent portal vein. 2. Numerous varicosities as described above. 3. Moderate to large amount of abdominopelvic ascites 4. Contrast filled appendix which appears to be a mildly dilated with apparent wall thickening which could be due to surrounding ascites.   07/09/2019 Imaging   ABD [place]PRESS[place]: 1. Three small hypoechoic lesions in the RIGHT hepatic lobe measuring less than 1 cm but not evident on comparison contrast enhanced CT from 07/04/2019. Recommend MRI of the abdomen without and with contrast to evaluate these hepatic lesions in cirrhotic patient. 2. Increased liver echogenicity and lobular contour consistent cirrhosis. 3. Moderate mild to moderate volume of ascites. 4. Mild gallbladder wall thickening likely related to ascites. 5. No cholelithiasis or cholecystitis evident   07/26/2019 Imaging   MR ABD  IMPRESSION: 1. In the lateral segment left hepatic lobe there are two LI-RADS category LR-5 lesions representing hepatocellular carcinoma. 2. There is also a LI-RADS category LR-3 lesion peripherally in the right hepatic lobe, intermediate probability for hepatocellular carcinoma. 3. Hepatic cirrhosis and diffuse wall thickening of the gallbladder. Widespread steatosis in the liver. 4. 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 5.  Aortic Atherosclerosis (ICD10-I70.0). 6. Widespread ascites and mesenteric edema.   07/26/2019 Tumor Marker   AFP 5.5 (baseline)   08/02/2019 Initial Diagnosis   Hepatocellular carcinoma ([place]   08/02/2019 Cancer Staging   Staging form: Liver, AJCC 8th Edition - Clinical stage from 08/02/2019: Stage II (cT2, cN0, cM0) - Signed by FTruitt Merle MD on 08/02/2019   08/07/2019 Imaging   CT Chest 08/07/19 IMPRESSION: 1. Tiny bilateral pulmonary nodules measuring up to 6 mm. These are too small to definitively characterize. Given the history of HCC, close follow-up recommended to ensure stability. 2.  Emphysema. (ICD10-J43.9)   08/09/2019 Procedure   Upper Endoscopy by Dr PHenrene Pastor9/10/20  IMPRESSION 1. Small esophageal varices 2. Mild diffuse portal hypertensive gastropathy 3. Otherwise normal EGD.   02/04/2020 Imaging   MRI abdomen  IMPRESSION: Stable exam. The small segment II lesions identified previously as LI-RADS category 5 are stable in size.   Previously characterized tiny LI-RADS 3 lesion in the inferior right liver is stable.   No new suspicious focal hepatic lesion on a background liver morphology consistent with cirrhosis.   Interval decrease in ascites with decrease and diffuse gallbladder wall thickening.  06/18/2020 Imaging   MRI Abdomen  IMPRESSION: 1. Stable 1.1 x 0.9 cm rounded lesion in the medial left lobe of the liver, hepatic segment II, demonstrating arterial phase hyperenhancement without washout or capsule. This  has previously been described as a LI-RADS category 5 lesion based on prior observations, if categorized by current imaging features this is consistent with LI-RADS category 4. 2. Stable 1.5 x 1.2 cm lesion lateral and inferior to this lesion in hepatic segment II with very subtle evidence of washout but without capsular enhancement. LI-RADS category 5. 3. Stable 5 mm focus of early arterial phase hyperenhancement of the inferior right lobe of the liver, hepatic segment VI, without evidence of washout or capsule. This remains consistent with LI-RADS category 3. 4. No new suspicious lesions or contrast enhancement. 5. Cirrhotic morphology of the liver. 6. Trace perihepatic ascites.     10/30/2021 Imaging   EXAM: MRI ABDOMEN WITHOUT AND WITH CONTRAST  IMPRESSION: 1. Ablation defect of the left lobe of the liver which subtends two lesions previously noted in hepatic segment II. No evidence of residual contrast enhancement to suggest viable tumor.  2. Significant interval increase in size of a hyperenhancing lesion of the posterior liver dome, hepatic segment VII, measuring 4.0 x 3.7 cm, previously 2.6 x 2.5 cm when measured similarly. This demonstrates some heterogeneous contrast enhancement although no clear evidence of washout or capsular enhancement. Findings remain LI-RADS category 5, consistent with hepatocellular carcinoma. 3. Slight interval increase in size of a hyperenhancing lesion of the inferior tip of the right lobe of the liver, hepatic segment VI, measuring 1.1 x 0.7 cm, previously no greater than 0.6 cm, as well as an additional adjacent lesion measuring 0.6 cm, previously 0.4 cm. Given threshold growth, these lesions are upstaged to LI-RADS category 4 and suspicious for additional small foci of hepatocellular carcinoma. 4. Cirrhotic morphology of the liver and hepatic steatosis. 5. Small volume ascites throughout the abdomen. 6. Large left, small right pleural effusions and  associated atelectasis or consolidation. 7. Layering sludge in the gallbladder with mild gallbladder wall thickening and pericholecystic fluid, similar to prior examination although nonspecific in the setting of ascites. No discrete gallstones.   11/10/2021 Imaging   EXAM: CT ABDOMEN WITHOUT AND WITH CONTRAST  IMPRESSION: Post treatment changes about the LEFT hepatic lobe lesion with variable arterial enhancement not showing washout and with non masslike features, favor perfusional defect, characterized as TR equivocal but favor nonviable.   Lesion in the posterior RIGHT hepatic lobe over time with enlargement and with serpiginous internal low signal on MR and low attenuation on CT. Findings categorized as LR M given signs of rim like enhancement on previous imaging and infiltrative appearance. Potentially infiltrative HCC but without specific imaging features of hepatocellular carcinoma by LI-RADS.   Enlarging lesion in the inferior RIGHT hepatic lobe greater than 50% diameter increase over 6 month interval, LI-RADS category 5 showing non rim arterial phase enhancement.   Additional foci of arterial phase enhancement without definitive washout, categorized as LI-RADS category 3 but in aggregate are more suspicious for small foci of hepatocellular carcinoma, attention on follow-up.   11/19/2021 Pathology Results   A. PLEURAL FLUID, LEFT, THORACENTESIS:   FINAL MICROSCOPIC DIAGNOSIS:  - No malignant cells identified  - Reactive mesothelial cells present    12/15/2021 PET scan   IMPRESSION: 1. Intensely hypermetabolic mass in the posterior RIGHT hepatic lobe consistent with known hepatocellular carcinoma. Smaller lesions identified on MRI are not hypermetabolic. 2. No evidence of  metastatic lymphadenopathy or distant metastatic disease. 3. Elongated nodular thickening in the lingula is favored benign. Moderate LEFT pleural effusion.   06/29/2022 -  Chemotherapy   Patient is on Treatment  Plan : LUNG Atezolizumab + Bevacizumab q21d Maintenance     Malignant neoplasm of central portion of right breast in female, estrogen receptor positive ([place])  08/29/2019 Cancer Staging   Staging form: Breast, AJCC 8th Edition - Clinical stage from 08/29/2019: Stage IB (cT2, cN0, cM0, G2, ER+, PR+, HER2-) - Signed by Truitt Merle, MD on 09/05/2019   08/29/2019 Mammogram   Diagnostic mammogram 08/29/19 IMPRESSION: Suspicious mass in the 12 o'clock region of the right breast 3 cm from the nipple measuring 3.2 x 1.6 x 2.6 cm   08/29/2019 Initial Biopsy   Diagnosis 08/29/19 Breast, right, needle core biopsy, 12 o'clock - INVASIVE DUCTAL CARCINOMA, SEE COMMENT.   08/29/2019 Receptors her2   Results: IMMUNOHISTOCHEMICAL AND MORPHOMETRIC ANALYSIS PERFORMED MANUALLY The tumor cells are NEGATIVE for Her2 (1+). Estrogen Receptor: 100%, POSITIVE, STRONG STAINING INTENSITY Progesterone Receptor: 90%, POSITIVE, STRONG STAINING INTENSITY Proliferation Marker Ki67: 15%   09/05/2019 Initial Diagnosis   Malignant neoplasm of central portion of right breast in female, estrogen receptor positive ([place])   09/13/2019 Breast MRI   MRI breast 09/13/19 IMPRESSION: 1. Irregular spiculated enhancing mass in the right breast representing the patient's known malignancy. No abnormal right axillary lymph nodes. 2. Prominent left internal mammary lymph node is indeterminate. No abnormal appearing right internal mammary lymph nodes. 3. No evidence of malignancy in the left breast.   10/16/2019 Cancer Staging   Staging form: Breast, AJCC 8th Edition - Pathologic stage from 10/16/2019: Stage IA (pT2, pN0, cM0, G2, ER+, PR+, HER2-, Oncotype DX score: 21) - Signed by Gardenia Phlegm, NP on 11/07/2019   10/16/2019 Oncotype testing   Oncotype 10/16/19 Recurrence score 21 with 7% benefit of Tamoxifen or AI. There is less than 1 % benefit of chemo.    10/16/2019 Surgery   RIGHT BREAST LUMPECTOMY WITH SENTINEL  LYMPH NODE BIOPSY by Dr Barry Dienes 10/16/19    10/16/2019 Pathology Results   FINAL MICROSCOPIC DIAGNOSIS: 10/16/19  A. BREAST, RIGHT, LUMPECTOMY:  - Invasive ductal carcinoma, 2.3 cm, Nottingham grade 2 of 3.  - Margins of resection are not involved (Closest margin: 1 mm,  anterior).  - See oncology table.   B. SENTINEL LYMPH NODE, RIGHT AXILLARY #1, BIOPSY:  - One lymph node, negative for carcinoma (0/1).   C. SENTINEL LYMPH NODE, RIGHT AXILLARY #2, BIOPSY:  - One lymph node, negative for carcinoma (0/1).    09/2019 -  Anti-estrogen oral therapy   Anastrozole 61m daily started in Nov 2020. Due to worsened knee pain/arthritis, she was switched to Tamoxifen in 03/2020.    12/03/2019 - 12/31/2019 Radiation Therapy   Adjuvant RT with Dr SIsidore Moos1/4/21-12/31/19   04/01/2020 Survivorship   SCP delivered by virtual visit per LCira Rue NP       INTERVAL HISTORY:  TEESHA SCHMALTZis here for a follow up of [place] She was last seen by me on 06/29/22. She was seen in the infusion area. She reports she is feeling much better and is eating more. She does note pain to her port, which was just recently placed.   All other systems were reviewed with the patient and are negative.  MEDICAL HISTORY:  Past Medical History:  Diagnosis Date   Anemia    Breast cancer ([facility] 08/30/2019   [place] ([facility]    liver-  just diagnosed, not started treatment yet   Cervicalgia    COPD (chronic obstructive pulmonary disease) (HCC)    Esophageal varices (HCC)    GERD (gastroesophageal reflux disease)    Headache    Hepatic cirrhosis (HCC)    Hepatitis C    resolved 2018   Hepatocellular carcinoma (HCC)    IBS (irritable bowel syndrome)    Lactose intolerance    Personal history of radiation therapy 11/2019   Right breast   Portal hypertension ([place])    Raynaud's syndrome    Thrombocytopenia ([place])     SURGICAL HISTORY: Past Surgical History:  Procedure Laterality Date   BREAST BIOPSY Right 09/2019   BREAST  LUMPECTOMY Right 09/2019   BREAST LUMPECTOMY WITH AXILLARY LYMPH NODE BIOPSY Right 10/16/2019   Procedure: RIGHT BREAST LUMPECTOMY WITH SENTINEL LYMPH NODE BIOPSY;  Surgeon: Stark Klein, MD;  Location: [facility];  Service: General;  Laterality: Right;   COLONOSCOPY     ESOPHAGEAL BANDING  10/27/2021   Procedure: ESOPHAGEAL BANDING;  Surgeon: Irene Shipper, MD;  Location: [facility];  Service: Endoscopy;;   ESOPHAGOGASTRODUODENOSCOPY (EGD) WITH PROPOFOL N/A 10/27/2021   Procedure: ESOPHAGOGASTRODUODENOSCOPY (EGD) WITH PROPOFOL;  Surgeon: Irene Shipper, MD;  Location: [facility];  Service: Endoscopy;  Laterality: N/A;   ESOPHAGOGASTRODUODENOSCOPY (EGD) WITH PROPOFOL N/A 01/04/2022   Procedure: ESOPHAGOGASTRODUODENOSCOPY (EGD) WITH PROPOFOL;  Surgeon: Irene Shipper, MD;  Location: [facility];  Service: Endoscopy;  Laterality: N/A;   HAMMER TOE SURGERY Left 2012   pins placed in great toe, 2nd,3rd, 4th toes, all pins removed except great toe   IR ANGIOGRAM SELECTIVE EACH ADDITIONAL VESSEL  12/16/2021   IR ANGIOGRAM SELECTIVE EACH ADDITIONAL VESSEL  12/16/2021   IR ANGIOGRAM SELECTIVE EACH ADDITIONAL VESSEL  12/22/2021   IR ANGIOGRAM VISCERAL SELECTIVE  12/16/2021   IR ANGIOGRAM VISCERAL SELECTIVE  12/22/2021   IR EMBO TUMOR ORGAN ISCHEMIA INFARCT INC GUIDE ROADMAPPING  12/22/2021   IR IMAGING GUIDED PORT INSERTION  07/07/2022   IR PARACENTESIS  11/13/2019   IR PARACENTESIS  03/04/2022   IR PATIENT EVAL TECH 0-60 MINS  05/19/2022   IR PERC PLEURAL DRAIN W/INDWELL CATH W/IMG GUIDE  03/09/2022   IR RADIOLOGIST EVAL & MGMT  08/16/2019   IR RADIOLOGIST EVAL & MGMT  02/06/2020   IR RADIOLOGIST EVAL & MGMT  06/26/2020   IR RADIOLOGIST EVAL & MGMT  12/31/2020   IR RADIOLOGIST EVAL & MGMT  07/21/2021   IR RADIOLOGIST EVAL & MGMT  10/08/2021   IR RADIOLOGIST EVAL & MGMT  01/13/2022   IR RADIOLOGIST EVAL & MGMT  03/05/2022   IR THORACENTESIS ASP PLEURAL SPACE W/IMG GUIDE  10/12/2021   IR THORACENTESIS ASP PLEURAL  SPACE W/IMG GUIDE  10/26/2021   IR THORACENTESIS ASP PLEURAL SPACE W/IMG GUIDE  11/19/2021   IR THORACENTESIS ASP PLEURAL SPACE W/IMG GUIDE  12/22/2021   IR US GUIDE VASC ACCESS RIGHT  12/16/2021   IR US GUIDE VASC ACCESS RIGHT  12/22/2021   PELVIC LAPAROSCOPY Bilateral 1992   RADIOLOGY WITH ANESTHESIA N/A 09/16/2021   Procedure: CT MICROWAVE ABLATION;  Surgeon: Aletta Edouard, MD;  Location: [facility];  Service: Radiology;  Laterality: N/A;   UPPER GASTROINTESTINAL ENDOSCOPY  2002   had esophageal dilitation    I have reviewed the social history and family history with the patient and they are unchanged from previous note.  ALLERGIES:  is allergic to hydromorphone hcl and aspirin.  MEDICATIONS:  Current Outpatient Medications  Medication Sig Dispense  Refill   baclofen (LIORESAL) 10 MG tablet Take 5 mg by mouth daily as needed for muscle spasms.     dicyclomine (BENTYL) 20 MG tablet Take 0.5 tablets (10 mg total) by mouth every 6 (six) hours as needed for spasms. Take one tablet every 6-8 hours as needed for abdominal cramping (Patient taking differently: Take 20 mg by mouth daily as needed (abdominal cramping).) 90 tablet 0   lactulose (CHRONULAC) 10 GM/15ML solution Take 15 mLs (10 g total) by mouth 2 (two) times daily as needed for mild constipation. 900 mL 2   midodrine (PROAMATINE) 5 MG tablet Take 3 tablets (15 mg total) by mouth 3 (three) times daily with meals. 270 tablet 2   nicotine (NICODERM CQ - DOSED IN MG/24 HOURS) 21 mg/24hr patch Place 21 mg onto the skin daily as needed (smoking cessation on work days).     oxyCODONE (OXY IR/ROXICODONE) 5 MG immediate release tablet Take 1 tablet (5 mg total) by mouth every 6 (six) hours as needed for severe pain. 60 tablet 0   pantoprazole (PROTONIX) 40 MG tablet Take 1 tablet (40 mg total) by mouth daily. 90 tablet 1   Prenatal Vit-Fe Fumarate-FA (PRENATAL PO) Take 1 tablet by mouth daily.     promethazine (PHENERGAN) 12.5 MG tablet Take 1  tablet (12.5 mg total) by mouth daily as needed for nausea or vomiting. 20 tablet 0   No current facility-administered medications for this visit.    PHYSICAL EXAMINATION: ECOG PERFORMANCE STATUS: 3 - Symptomatic, >50% confined to bed  There were no vitals filed for this visit. Wt Readings from Last 3 Encounters:  06/29/22 87 lb 11.2 oz (39.8 kg)  06/09/22 87 lb (39.5 kg)  06/01/22 86 lb 3.2 oz (39.1 kg)     GENERAL:alert, no distress and comfortable SKIN: skin color normal, no rashes or significant lesions EYES: normal, Conjunctiva are pink and non-injected, sclera clear  NEURO: alert & oriented x 3 with fluent speech  LABORATORY DATA:  I have reviewed the data as listed    Latest Ref Rng & Units 07/09/2022   10:55 AM 06/29/2022    1:43 PM 06/09/2022    2:07 PM  CBC  WBC 4.0 - 10.5 K/uL 7.1  14.2  10.1   Hemoglobin 12.0 - 15.0 g/dL 10.3  10.1  10.5   Hematocrit 36.0 - 46.0 % 31.2  31.5  32.6   Platelets 150 - 400 K/uL 85  115  143         Latest Ref Rng & Units 07/09/2022   10:55 AM 06/29/2022    1:43 PM 06/09/2022    2:07 PM  CMP  Glucose 70 - 99 mg/dL 106  114  122   BUN 6 - 20 mg/dL 19  50  22   Creatinine 0.44 - 1.00 mg/dL 0.73  1.33  0.74   Sodium 135 - 145 mmol/L 129  127  125   Potassium 3.5 - 5.1 mmol/L 3.7  4.5  5.5   Chloride 98 - 111 mmol/L 99  103  101   CO2 22 - 32 mmol/L _0 Calcium 8.9 - 10.3 mg/dL 7.7  7.9  8.2   Total Protein 6.5 - 8.1 g/dL 5.6  5.9  5.9   Total Bilirubin 0.3 - 1.2 mg/dL 1.8  0.8  1.4   Alkaline Phos 38 - 126 U/L 74  77  84   AST 15 - 41 U/L 35  40  42   ALT 0 - 44 U/L 23  32  30       RADIOGRAPHIC STUDIES: I have personally reviewed the radiological images as listed and agreed with the findings in the report. No results found.    No orders of the defined types were placed in this encounter.  All questions were answered. The patient knows to call the clinic with any problems, questions or concerns. No barriers to  learning was detected. The total time spent in the appointment was 20 minutes.     Truitt Merle, MD 07/09/2022   I, Wilburn Mylar, am acting as scribe for Truitt Merle, MD.   I have reviewed the above documentation for accuracy and completeness, and I agree with the above.

## 2022-07-10 LAB — T4: T4, Total: 7.2 ug/dL (ref 4.5–12.0)

## 2022-07-18 NOTE — Progress Notes (Unsigned)
[facility]   [phone] [phone]   Clinic Follow up Note   Patient Care Team: Renford Dills, MD as PCP - General (Internal Medicine) Barrie Folk, RN as Oncology Nurse Navigator Marina Goodell, Wilhemina Bonito, MD as Consulting Physician (Gastroenterology) Malachy Mood, MD as Consulting Physician (Hematology) Pollyann Samples, NP as Nurse Practitioner (Nurse Practitioner) Almond Lint, MD as Consulting Physician (General Surgery) Irish Lack, MD as Consulting Physician (Interventional Radiology) Annamarie Major, CRNP as Nurse Practitioner (Nurse Practitioner) Pershing Proud, RN as Oncology Nurse Navigator Donnelly Angelica, RN as Oncology Nurse Navigator Lonie Peak, MD as Attending Physician (Radiation Oncology) Vivi Barrack, DPM as Consulting Physician (Podiatry) 07/19/2022  CHIEF COMPLAINT: Follow up [facility] and breast cancer   SUMMARY OF ONCOLOGIC HISTORY: Oncology History Overview Note  Cancer Staging Hepatocellular carcinoma [facility]) Staging form: Liver, AJCC 8th Edition - Clinical stage from 08/02/2019: Stage II (cT2, cN0, cM0) - Signed by Malachy Mood, MD on 08/02/2019  Malignant neoplasm of central portion of right breast in female, estrogen receptor positive (HCC) Staging form: Breast, AJCC 8th Edition - Clinical stage from 08/29/2019: Stage IB (cT2, cN0, cM0, G2, ER+, PR+, HER2-) - Signed by Malachy Mood, MD on 09/05/2019    Hepatocellular carcinoma (HCC)  07/04/2019 Imaging   CT AP IMPRESSION: 1. Findings of cirrhosis and hepatomegaly.  Patent portal vein. 2. Numerous varicosities as described above. 3. Moderate to large amount of abdominopelvic ascites 4. Contrast filled appendix which appears to be a mildly dilated with apparent wall thickening which could be due to surrounding ascites.   07/09/2019 Imaging   ABD US IMPRESSION: 1. Three small hypoechoic lesions in the RIGHT hepatic lobe measuring less than 1 cm but not evident on comparison  contrast enhanced CT from 07/04/2019. Recommend MRI of the abdomen without and with contrast to evaluate these hepatic lesions in cirrhotic patient. 2. Increased liver echogenicity and lobular contour consistent cirrhosis. 3. Moderate mild to moderate volume of ascites. 4. Mild gallbladder wall thickening likely related to ascites. 5. No cholelithiasis or cholecystitis evident   07/26/2019 Imaging   MR ABD IMPRESSION: 1. In the lateral segment left hepatic lobe there are two LI-RADS category LR-5 lesions representing hepatocellular carcinoma. 2. There is also a LI-RADS category LR-3 lesion peripherally in the right hepatic lobe, intermediate probability for hepatocellular carcinoma. 3. Hepatic cirrhosis and diffuse wall thickening of the gallbladder. Widespread steatosis in the liver. 4. 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 5.  Aortic Atherosclerosis (ICD10-I70.0). 6. Widespread ascites and mesenteric edema.   07/26/2019 Tumor Marker   AFP 5.5 (baseline)   08/02/2019 Initial Diagnosis   Hepatocellular carcinoma (HCC)   08/02/2019 Cancer Staging   Staging form: Liver, AJCC 8th Edition - Clinical stage from 08/02/2019: Stage II (cT2, cN0, cM0) - Signed by Malachy Mood, MD on 08/02/2019   08/07/2019 Imaging   CT Chest 08/07/19 IMPRESSION: 1. Tiny bilateral pulmonary nodules measuring up to 6 mm. These are too small to definitively characterize. Given the history of HCC, close follow-up recommended to ensure stability. 2.  Emphysema. (ICD10-J43.9)   08/09/2019 Procedure   Upper Endoscopy by Dr Marina Goodell 08/09/19  IMPRESSION 1. Small esophageal varices 2. Mild diffuse portal hypertensive gastropathy 3. Otherwise normal EGD.   02/04/2020 Imaging   MRI abdomen  IMPRESSION: Stable exam. The small segment II lesions identified previously as LI-RADS category 5 are stable in size.   Previously characterized tiny LI-RADS 3 lesion in the inferior right liver is stable.  No new suspicious focal hepatic lesion on a background liver morphology consistent with cirrhosis.   Interval decrease in ascites with decrease and diffuse gallbladder wall thickening.     06/18/2020 Imaging   MRI Abdomen  IMPRESSION: 1. Stable 1.1 x 0.9 cm rounded lesion in the medial left lobe of the liver, hepatic segment II, demonstrating arterial phase hyperenhancement without washout or capsule. This has previously been described as a LI-RADS category 5 lesion based on prior observations, if categorized by current imaging features this is consistent with LI-RADS category 4. 2. Stable 1.5 x 1.2 cm lesion lateral and inferior to this lesion in hepatic segment II with very subtle evidence of washout but without capsular enhancement. LI-RADS category 5. 3. Stable 5 mm focus of early arterial phase hyperenhancement of the inferior right lobe of the liver, hepatic segment VI, without evidence of washout or capsule. This remains consistent with LI-RADS category 3. 4. No new suspicious lesions or contrast enhancement. 5. Cirrhotic morphology of the liver. 6. Trace perihepatic ascites.     10/30/2021 Imaging   EXAM: MRI ABDOMEN WITHOUT AND WITH CONTRAST  IMPRESSION: 1. Ablation defect of the left lobe of the liver which subtends two lesions previously noted in hepatic segment II. No evidence of residual contrast enhancement to suggest viable tumor.  2. Significant interval increase in size of a hyperenhancing lesion of the posterior liver dome, hepatic segment VII, measuring 4.0 x 3.7 cm, previously 2.6 x 2.5 cm when measured similarly. This demonstrates some heterogeneous contrast enhancement although no clear evidence of washout or capsular enhancement. Findings remain LI-RADS category 5, consistent with hepatocellular carcinoma. 3. Slight interval increase in size of a hyperenhancing lesion of the inferior tip of the right lobe of the liver, hepatic segment VI, measuring 1.1 x 0.7  cm, previously no greater than 0.6 cm, as well as an additional adjacent lesion measuring 0.6 cm, previously 0.4 cm. Given threshold growth, these lesions are upstaged to LI-RADS category 4 and suspicious for additional small foci of hepatocellular carcinoma. 4. Cirrhotic morphology of the liver and hepatic steatosis. 5. Small volume ascites throughout the abdomen. 6. Large left, small right pleural effusions and associated atelectasis or consolidation. 7. Layering sludge in the gallbladder with mild gallbladder wall thickening and pericholecystic fluid, similar to prior examination although nonspecific in the setting of ascites. No discrete gallstones.   11/10/2021 Imaging   EXAM: CT ABDOMEN WITHOUT AND WITH CONTRAST  IMPRESSION: Post treatment changes about the LEFT hepatic lobe lesion with variable arterial enhancement not showing washout and with non masslike features, favor perfusional defect, characterized as TR equivocal but favor nonviable.   Lesion in the posterior RIGHT hepatic lobe over time with enlargement and with serpiginous internal low signal on MR and low attenuation on CT. Findings categorized as LR M given signs of rim like enhancement on previous imaging and infiltrative appearance. Potentially infiltrative HCC but without specific imaging features of hepatocellular carcinoma by LI-RADS.   Enlarging lesion in the inferior RIGHT hepatic lobe greater than 50% diameter increase over 6 month interval, LI-RADS category 5 showing non rim arterial phase enhancement.   Additional foci of arterial phase enhancement without definitive washout, categorized as LI-RADS category 3 but in aggregate are more suspicious for small foci of hepatocellular carcinoma, attention on follow-up.   11/19/2021 Pathology Results   A. PLEURAL FLUID, LEFT, THORACENTESIS:   FINAL MICROSCOPIC DIAGNOSIS:  - No malignant cells identified  - Reactive mesothelial cells present    12/15/2021 PET scan  IMPRESSION: 1. Intensely hypermetabolic mass in the posterior RIGHT hepatic lobe consistent with known hepatocellular carcinoma. Smaller lesions identified on MRI are not hypermetabolic. 2. No evidence of metastatic lymphadenopathy or distant metastatic disease. 3. Elongated nodular thickening in the lingula is favored benign. Moderate LEFT pleural effusion.   06/29/2022 -  Chemotherapy   Patient is on Treatment Plan : LUNG Atezolizumab + Bevacizumab q21d Maintenance     Malignant neoplasm of central portion of right breast in female, estrogen receptor positive ([place])  08/29/2019 Cancer Staging   Staging form: Breast, AJCC 8th Edition - Clinical stage from 08/29/2019: Stage IB (cT2, cN0, cM0, G2, ER+, PR+, HER2-) - Signed by Truitt Merle, MD on 09/05/2019   08/29/2019 Mammogram   Diagnostic mammogram 08/29/19 IMPRESSION: Suspicious mass in the 12 o'clock region of the right breast 3 cm from the nipple measuring 3.2 x 1.6 x 2.6 cm   08/29/2019 Initial Biopsy   Diagnosis 08/29/19 Breast, right, needle core biopsy, 12 o'clock - INVASIVE DUCTAL CARCINOMA, SEE COMMENT.   08/29/2019 Receptors her2   Results: IMMUNOHISTOCHEMICAL AND MORPHOMETRIC ANALYSIS PERFORMED MANUALLY The tumor cells are NEGATIVE for Her2 (1+). Estrogen Receptor: 100%, POSITIVE, STRONG STAINING INTENSITY Progesterone Receptor: 90%, POSITIVE, STRONG STAINING INTENSITY Proliferation Marker Ki67: 15%   09/05/2019 Initial Diagnosis   Malignant neoplasm of central portion of right breast in female, estrogen receptor positive ([place])   09/13/2019 Breast MRI   MRI breast 09/13/19 IMPRESSION: 1. Irregular spiculated enhancing mass in the right breast representing the patient's known malignancy. No abnormal right axillary lymph nodes. 2. Prominent left internal mammary lymph node is indeterminate. No abnormal appearing right internal mammary lymph nodes. 3. No evidence of malignancy in the left breast.   10/16/2019 Cancer Staging    Staging form: Breast, AJCC 8th Edition - Pathologic stage from 10/16/2019: Stage IA (pT2, pN0, cM0, G2, ER+, PR+, HER2-, Oncotype DX score: 21) - Signed by Gardenia Phlegm, NP on 11/07/2019   10/16/2019 Oncotype testing   Oncotype 10/16/19 Recurrence score 21 with 7% benefit of Tamoxifen or AI. There is less than 1 % benefit of chemo.    10/16/2019 Surgery   RIGHT BREAST LUMPECTOMY WITH SENTINEL LYMPH NODE BIOPSY by Dr Barry Dienes 10/16/19    10/16/2019 Pathology Results   FINAL MICROSCOPIC DIAGNOSIS: 10/16/19  A. BREAST, RIGHT, LUMPECTOMY:  - Invasive ductal carcinoma, 2.3 cm, Nottingham grade 2 of 3.  - Margins of resection are not involved (Closest margin: 1 mm,  anterior).  - See oncology table.   B. SENTINEL LYMPH NODE, RIGHT AXILLARY #1, BIOPSY:  - One lymph node, negative for carcinoma (0/1).   C. SENTINEL LYMPH NODE, RIGHT AXILLARY #2, BIOPSY:  - One lymph node, negative for carcinoma (0/1).    09/2019 -  Anti-estrogen oral therapy   Anastrozole $RemoveBefo'1mg'nqCMeSghyIE$  daily started in Nov 2020. Due to worsened knee pain/arthritis, she was switched to Tamoxifen in 03/2020.    12/03/2019 - 12/31/2019 Radiation Therapy   Adjuvant RT with Dr Isidore Moos 12/03/19-12/31/19   04/01/2020 Survivorship   SCP delivered by virtual visit per Cira Rue, NP      CURRENT THERAPY: First line Atezolizumab/Bevacizumab q21 days, starting 06/29/22  INTERVAL HISTORY: Ms. Forman returns for follow up as scheduled. She began Atezo/beva 06/29/22, tolerated well except fatigue and a cough that improved 2 days later.  Saw [name] 07/09/22 for toxicity check. She also had phone f/up with Dr. Kipp Brood same day.  She continues to have a dry cough that is "medium," not bad  and not all day.  Denies fever, chills, chest pain or dyspnea.  She continues to drain 1 L light-colored fluid from left lung per day.  She says this cough does not feel like when she had more fluid buildup.  Cough meds make her sick to her stomach.  She takes  oxycodone twice daily for back pain, notes that the Pleurx catheter is "pinching a nerve."  She takes antiemetic with her p.m. dose of Oxy which manages her nausea.  Appetite fluctuates.  Bowels moving normally.  Denies any other new specific complaints.  All other systems were reviewed with the patient and are negative.  MEDICAL HISTORY:  Past Medical History:  Diagnosis Date   Anemia    Breast cancer ([place]) 08/30/2019   Cancer ([place])    liver- just diagnosed, not started treatment yet   Cervicalgia    COPD (chronic obstructive pulmonary disease) (HCC)    Esophageal varices (HCC)    GERD (gastroesophageal reflux disease)    Headache    Hepatic cirrhosis (HCC)    Hepatitis C    resolved 2018   Hepatocellular carcinoma (HCC)    IBS (irritable bowel syndrome)    Lactose intolerance    Personal history of radiation therapy 11/2019   Right breast   Portal hypertension ([place])    Raynaud's syndrome    Thrombocytopenia ([place])     SURGICAL HISTORY: Past Surgical History:  Procedure Laterality Date   BREAST BIOPSY Right 09/2019   BREAST LUMPECTOMY Right 09/2019   BREAST LUMPECTOMY WITH AXILLARY LYMPH NODE BIOPSY Right 10/16/2019   Procedure: RIGHT BREAST LUMPECTOMY WITH SENTINEL LYMPH NODE BIOPSY;  Surgeon: Stark Klein, MD;  Location: [place];  Service: General;  Laterality: Right;   COLONOSCOPY     ESOPHAGEAL BANDING  10/27/2021   Procedure: ESOPHAGEAL BANDING;  Surgeon: Irene Shipper, MD;  Location: [facility];  Service: Endoscopy;;   ESOPHAGOGASTRODUODENOSCOPY (EGD) WITH PROPOFOL N/A 10/27/2021   Procedure: ESOPHAGOGASTRODUODENOSCOPY (EGD) WITH PROPOFOL;  Surgeon: Irene Shipper, MD;  Location: [facility];  Service: Endoscopy;  Laterality: N/A;   ESOPHAGOGASTRODUODENOSCOPY (EGD) WITH PROPOFOL N/A 01/04/2022   Procedure: ESOPHAGOGASTRODUODENOSCOPY (EGD) WITH PROPOFOL;  Surgeon: Irene Shipper, MD;  Location: [facility];  Service: Endoscopy;  Laterality: N/A;   HAMMER TOE SURGERY  Left 2012   pins placed in great toe, 2nd,3rd, 4th toes, all pins removed except great toe   IR ANGIOGRAM SELECTIVE EACH ADDITIONAL VESSEL  12/16/2021   IR ANGIOGRAM SELECTIVE EACH ADDITIONAL VESSEL  12/16/2021   IR ANGIOGRAM SELECTIVE EACH ADDITIONAL VESSEL  12/22/2021   IR ANGIOGRAM VISCERAL SELECTIVE  12/16/2021   IR ANGIOGRAM VISCERAL SELECTIVE  12/22/2021   IR EMBO TUMOR ORGAN ISCHEMIA INFARCT INC GUIDE ROADMAPPING  12/22/2021   IR IMAGING GUIDED PORT INSERTION  07/07/2022   IR PARACENTESIS  11/13/2019   IR PARACENTESIS  03/04/2022   IR PATIENT EVAL TECH 0-60 MINS  05/19/2022   IR PERC PLEURAL DRAIN W/INDWELL CATH W/IMG GUIDE  03/09/2022   IR RADIOLOGIST EVAL & MGMT  08/16/2019   IR RADIOLOGIST EVAL & MGMT  02/06/2020   IR RADIOLOGIST EVAL & MGMT  06/26/2020   IR RADIOLOGIST EVAL & MGMT  12/31/2020   IR RADIOLOGIST EVAL & MGMT  07/21/2021   IR RADIOLOGIST EVAL & MGMT  10/08/2021   IR RADIOLOGIST EVAL & MGMT  01/13/2022   IR RADIOLOGIST EVAL & MGMT  03/05/2022   IR THORACENTESIS ASP PLEURAL SPACE W/IMG GUIDE  10/12/2021   IR THORACENTESIS ASP  PLEURAL SPACE W/IMG GUIDE  10/26/2021   IR THORACENTESIS ASP PLEURAL SPACE W/IMG GUIDE  11/19/2021   IR THORACENTESIS ASP PLEURAL SPACE W/IMG GUIDE  12/22/2021   IR US GUIDE VASC ACCESS RIGHT  12/16/2021   IR US GUIDE VASC ACCESS RIGHT  12/22/2021   PELVIC LAPAROSCOPY Bilateral 1992   RADIOLOGY WITH ANESTHESIA N/A 09/16/2021   Procedure: CT MICROWAVE ABLATION;  Surgeon: Aletta Edouard, MD;  Location: [facility];  Service: Radiology;  Laterality: N/A;   UPPER GASTROINTESTINAL ENDOSCOPY  2002   had esophageal dilitation    I have reviewed the social history and family history with the patient and they are unchanged from previous note.  ALLERGIES:  is allergic to hydromorphone hcl and aspirin.  MEDICATIONS:  Current Outpatient Medications  Medication Sig Dispense Refill   baclofen (LIORESAL) 10 MG tablet Take 5 mg by mouth daily as needed for muscle spasms.      dicyclomine (BENTYL) 20 MG tablet Take 0.5 tablets (10 mg total) by mouth every 6 (six) hours as needed for spasms. Take one tablet every 6-8 hours as needed for abdominal cramping (Patient taking differently: Take 20 mg by mouth daily as needed (abdominal cramping).) 90 tablet 0   lactulose (CHRONULAC) 10 GM/15ML solution Take 15 mLs (10 g total) by mouth 2 (two) times daily as needed for mild constipation. 900 mL 2   midodrine (PROAMATINE) 5 MG tablet Take 3 tablets (15 mg total) by mouth 3 (three) times daily with meals. 270 tablet 2   nicotine (NICODERM CQ - DOSED IN MG/24 HOURS) 21 mg/24hr patch Place 21 mg onto the skin daily as needed (smoking cessation on work days).     oxyCODONE (OXY IR/ROXICODONE) 5 MG immediate release tablet Take 1 tablet (5 mg total) by mouth every 6 (six) hours as needed for severe pain. 60 tablet 0   pantoprazole (PROTONIX) 40 MG tablet Take 1 tablet (40 mg total) by mouth daily. 90 tablet 1   Prenatal Vit-Fe Fumarate-FA (PRENATAL PO) Take 1 tablet by mouth daily.     promethazine (PHENERGAN) 12.5 MG tablet Take 1 tablet (12.5 mg total) by mouth daily as needed for nausea or vomiting. 20 tablet 0   No current facility-administered medications for this visit.   Facility-Administered Medications Ordered in Other Visits  Medication Dose Route Frequency Provider Last Rate Last Admin   atezolizumab (TECENTRIQ) 1,200 mg in sodium chloride 0.9 % 250 mL chemo infusion  1,200 mg Intravenous Once Truitt Merle, MD       bevacizumab-bvzr (ZIRABEV) 600 mg in sodium chloride 0.9 % 100 mL chemo infusion  15 mg/kg (Treatment Plan Recorded) Intravenous Once Truitt Merle, MD       heparin lock flush 100 unit/mL  500 Units Intracatheter Once PRN Truitt Merle, MD       sodium chloride flush (NS) 0.9 % injection 10 mL  10 mL Intracatheter PRN Truitt Merle, MD        PHYSICAL EXAMINATION: ECOG PERFORMANCE STATUS: 1 - Symptomatic but completely ambulatory  Vitals:   07/19/22 1026  BP: 107/72   Pulse: 99  Resp: 18  Temp: (!) 97.5 F (36.4 C)  SpO2: 99%   Filed Weights   07/19/22 1026  Weight: 84 lb 8 oz (38.3 kg)    GENERAL:alert, no distress and comfortable SKIN: no rash  EYES:  sclera clear LUNGS: decreased throughout with normal breathing effort.  No adventitious sounds HEART: regular rate & rhythm, no lower extremity edema ABDOMEN:abdomen soft, non-tender and  normal bowel sounds NEURO: alert & oriented x 3 with fluent speech, no focal motor/sensory deficits PAC covered with gauze Left Pleurx catheter dressing C/C/I  LABORATORY DATA:  I have reviewed the data as listed    Latest Ref Rng & Units 07/19/2022   10:01 AM 07/09/2022   10:55 AM 06/29/2022    1:43 PM  CBC  WBC 4.0 - 10.5 K/uL 7.1  7.1  14.2   Hemoglobin 12.0 - 15.0 g/dL 10.5  10.3  10.1   Hematocrit 36.0 - 46.0 % 32.5  31.2  31.5   Platelets 150 - 400 K/uL 153  85  115         Latest Ref Rng & Units 07/19/2022   10:01 AM 07/09/2022   10:55 AM 06/29/2022    1:43 PM  CMP  Glucose 70 - 99 mg/dL 131  106  114   BUN 6 - 20 mg/dL 24  19  50   Creatinine 0.44 - 1.00 mg/dL 0.71  0.73  1.33   Sodium 135 - 145 mmol/L 126  129  127   Potassium 3.5 - 5.1 mmol/L 4.8  3.7  4.5   Chloride 98 - 111 mmol/L 102  99  103   CO2 22 - 32 mmol/L $RemoveB'22  25  19   'wNEguUxe$ Calcium 8.9 - 10.3 mg/dL 8.1  7.7  7.9   Total Protein 6.5 - 8.1 g/dL 5.6  5.6  5.9   Total Bilirubin 0.3 - 1.2 mg/dL 1.3  1.8  0.8   Alkaline Phos 38 - 126 U/L 79  74  77   AST 15 - 41 U/L 40  35  40   ALT 0 - 44 U/L 23  23  32       RADIOGRAPHIC STUDIES: I have personally reviewed the radiological images as listed and agreed with the findings in the report. No results found.   ASSESSMENT & PLAN: Theresa Rocha is a 56 y.o. female with       1.  Multifocal Hepatocellular carcinoma, cT2N0Mx, stage II -presented with abdominal pain and rectal bleeding in 06/2019. Work up scans showed multifocal lesions of liver consistent with [place] with indeterminate right  lobe lesions. AFP was WNL. -she was found to not be a transplant candidate due to history of breast cancer and multifocal HCC beyond Milan criteria. She was also not felt to be a surgical candidate due to her liver function and location of tumors.  -S/p left lobe ablation on 09/16/21 and right lobe Y90 on 12/22/2021 by Dr. Kathlene Cote -Liver MRI 06/06/2022 showed cancer progression of her previously treated lesion in segment 7 with extracapsular extension and adenopathy, consistent with disease progression -She began systemic treatment with atezolizumab and bevacizumab q. 21 days starting 06/29/2022 -Ms. Agcaoili appears stable, she completed cycle 1 atezo/beva, tolerated well with dry cough and fatigue which have improved.  Cough is mild, exam is benign.  We discussed observation versus further work-up, she prefers to hold on chest x-ray for now.  She knows to call if cough worsens or she develops new concerns.  Side effects adequately managed with supportive care at home, she is able to recover and function well.  There is no clinical evidence of disease progression -Labs reviewed, adequate to proceed with cycle 2 Atezo/beva today as planned.  -Follow-up in 3 weeks with cycle 3  2.  Recurrent left pleural effusion  -Occurred following left lobe liver ablation for [facility] on 09/16/2021 -Cytology from several thoracentesis were  all negative -PET scan 12/15/2021 negative for thoracic metastasis -Pleurx catheter placed 03/09/2022, per Dr. Kipp Brood she is not a candidate for pleurodesis -Abdominal MRI 06/06/2022 showed nodularity in the left pleura, concerning for malignancy -Scheduled for [facility] PET scan 08/13/2022 due to recent tamoxifen which she has stopped   3.  Hyponatremia, hypotension  -Refractory, secondary to liver cirrhosis and high output from Pleurx drain -Continues to drain 1 L/day -BP stable, NA 126 today, slightly lower this month but overall stable. I encouraged her to increase hydration.  4.  Malignant neoplasm of central portion of right breast, Stage II, c(T2N0M0), ER/PR+, HER2-, Grade II, RS 21 -diagnosed in 07/2019, s/p right lumpectomy with SNLB. Recurrence Score 21 (low risk), adjuvant chemotherapy was not recommended -She completed adjuvant RT with Dr Isidore Moos 12/03/19-12/31/19 -She started anastrozole in 09/2019. Due to knee joint pain, she was switched to Tamoxifen in 03/2020. She is tolerating with hot flashes. Will continue to complete 10 years.  -most recent mammogram 05/13/2022 showed new calcifications in the superior right breast anterior to the lumpectomy site, biopsy was recommended but not done yet -Tamoxifen on hold since 06/15/2022 for work-up with [facility] PET scan in September  5. 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  -s/p harvoni and ribavirin treatment for Hep C per Dr. Bradd Burner in 2018 -EGD on 10/27/21 by Dr. Henrene Pastor while hospitalized showed esophageal varices, which were banded at that time. No residual varices on repeat EGD on 01/04/22. -Continue to F/u with Dr Henrene Pastor and [facility] Locks   6. Alcohol and smoking Cessation  -She has h/o smoking 0.5 PPD x35 years. She drank 3-4 cans beer nightly x20 years. -Cessation of both was discussed and strongly encouraged. She has stopped drinking alcohol but smokes 5 cigarettes a day still. She did not smoke while hospitalized, but picked it up again when she got home. -Uses a nicotine patch when she is at work   7. Osteoporosis, Hypocalcemia  -DEXA from 09/21/19 at her GYN office showed osteoporosis T score -3.1 at [facility].   -She started Zometa q35months for 2 years on 07/17/20. Held before 3rd dose on 05/01/21, due to mouth sores. She has full sets of dentures and has not seen dentist in years. -I previously recommended her to start Calcium supplement daily    8. Thrombocytopenia, Amenia -secondary to liver cirrhosis, splenomegaly, and/or  varices -slowly improving   PLAN: -Labs reviewed, proceed with cycle 2 Atezo/Beva today as planned -Follow-up with cycle 3 in 3 weeks -[facility] PET 9/15 and phone follow-up 9/18 as previously scheduled -F/up Dr. Kipp Brood sept as scheduled -Rx EMLA cream   All questions were answered. The patient knows to call the clinic with any problems, questions or concerns. No barriers to learning was detected. I spent 20 minutes counseling the patient face to face. The total time spent in the appointment was 30 minutes and more than 50% was on counseling and review of test results.     Alla Feeling, NP 07/19/22

## 2022-07-19 ENCOUNTER — Other Ambulatory Visit: Payer: Self-pay

## 2022-07-19 ENCOUNTER — Inpatient Hospital Stay: Payer: BC Managed Care – PPO

## 2022-07-19 ENCOUNTER — Inpatient Hospital Stay (HOSPITAL_BASED_OUTPATIENT_CLINIC_OR_DEPARTMENT_OTHER): Payer: BC Managed Care – PPO | Admitting: Nurse Practitioner

## 2022-07-19 ENCOUNTER — Encounter: Payer: Self-pay | Admitting: Nurse Practitioner

## 2022-07-19 VITALS — BP 95/68 | HR 92 | Resp 18

## 2022-07-19 VITALS — BP 107/72 | HR 99 | Temp 97.5°F | Resp 18 | Ht 63.0 in | Wt 84.5 lb

## 2022-07-19 DIAGNOSIS — C220 Liver cell carcinoma: Secondary | ICD-10-CM

## 2022-07-19 DIAGNOSIS — Z5111 Encounter for antineoplastic chemotherapy: Secondary | ICD-10-CM | POA: Diagnosis not present

## 2022-07-19 DIAGNOSIS — Z5112 Encounter for antineoplastic immunotherapy: Secondary | ICD-10-CM | POA: Diagnosis not present

## 2022-07-19 DIAGNOSIS — C50111 Malignant neoplasm of central portion of right female breast: Secondary | ICD-10-CM | POA: Diagnosis not present

## 2022-07-19 DIAGNOSIS — Z79899 Other long term (current) drug therapy: Secondary | ICD-10-CM | POA: Diagnosis not present

## 2022-07-19 DIAGNOSIS — Z170 Estrogen receptor positive status [ER+]: Secondary | ICD-10-CM

## 2022-07-19 DIAGNOSIS — E871 Hypo-osmolality and hyponatremia: Secondary | ICD-10-CM | POA: Diagnosis not present

## 2022-07-19 DIAGNOSIS — I959 Hypotension, unspecified: Secondary | ICD-10-CM | POA: Diagnosis not present

## 2022-07-19 LAB — CBC WITH DIFFERENTIAL (CANCER CENTER ONLY)
Abs Immature Granulocytes: 0.04 10*3/uL (ref 0.00–0.07)
Basophils Absolute: 0.1 10*3/uL (ref 0.0–0.1)
Basophils Relative: 1 %
Eosinophils Absolute: 0.2 10*3/uL (ref 0.0–0.5)
Eosinophils Relative: 2 %
HCT: 32.5 % — ABNORMAL LOW (ref 36.0–46.0)
Hemoglobin: 10.5 g/dL — ABNORMAL LOW (ref 12.0–15.0)
Immature Granulocytes: 1 %
Lymphocytes Relative: 9 %
Lymphs Abs: 0.6 10*3/uL — ABNORMAL LOW (ref 0.7–4.0)
MCH: 24.5 pg — ABNORMAL LOW (ref 26.0–34.0)
MCHC: 32.3 g/dL (ref 30.0–36.0)
MCV: 75.8 fL — ABNORMAL LOW (ref 80.0–100.0)
Monocytes Absolute: 1.1 10*3/uL — ABNORMAL HIGH (ref 0.1–1.0)
Monocytes Relative: 16 %
Neutro Abs: 5.1 10*3/uL (ref 1.7–7.7)
Neutrophils Relative %: 71 %
Platelet Count: 153 10*3/uL (ref 150–400)
RBC: 4.29 MIL/uL (ref 3.87–5.11)
RDW: 19 % — ABNORMAL HIGH (ref 11.5–15.5)
WBC Count: 7.1 10*3/uL (ref 4.0–10.5)
nRBC: 0 % (ref 0.0–0.2)

## 2022-07-19 LAB — CMP (CANCER CENTER ONLY)
ALT: 23 U/L (ref 0–44)
AST: 40 U/L (ref 15–41)
Albumin: 2.5 g/dL — ABNORMAL LOW (ref 3.5–5.0)
Alkaline Phosphatase: 79 U/L (ref 38–126)
Anion gap: 2 — ABNORMAL LOW (ref 5–15)
BUN: 24 mg/dL — ABNORMAL HIGH (ref 6–20)
CO2: 22 mmol/L (ref 22–32)
Calcium: 8.1 mg/dL — ABNORMAL LOW (ref 8.9–10.3)
Chloride: 102 mmol/L (ref 98–111)
Creatinine: 0.71 mg/dL (ref 0.44–1.00)
GFR, Estimated: >60 mL/min (ref >60)
Glucose, Bld: 131 mg/dL — ABNORMAL HIGH (ref 70–99)
Potassium: 4.8 mmol/L (ref 3.5–5.1)
Sodium: 126 mmol/L — ABNORMAL LOW (ref 135–145)
Total Bilirubin: 1.3 mg/dL — ABNORMAL HIGH (ref 0.3–1.2)
Total Protein: 5.6 g/dL — ABNORMAL LOW (ref 6.5–8.1)

## 2022-07-19 LAB — TSH: TSH: 6.105 u[IU]/mL — ABNORMAL HIGH (ref 0.350–4.500)

## 2022-07-19 LAB — TOTAL PROTEIN, URINE DIPSTICK: Protein, ur: <30 mg/dL — AB

## 2022-07-19 MED ORDER — SODIUM CHLORIDE 0.9% FLUSH
10.0000 mL | INTRAVENOUS | Status: DC | PRN
  Administered 2022-07-19: 10 mL

## 2022-07-19 MED ORDER — LIDOCAINE-PRILOCAINE 2.5-2.5 % EX CREA: 1.0000 | TOPICAL_CREAM | CUTANEOUS | 2 refills | Status: AC | PRN

## 2022-07-19 MED ORDER — HEPARIN SOD (PORK) LOCK FLUSH 100 UNIT/ML IV SOLN
500.0000 [IU] | Freq: Once | INTRAVENOUS | Status: AC | PRN
  Administered 2022-07-19: 500 [IU]

## 2022-07-19 MED ORDER — SODIUM CHLORIDE 0.9 % IV SOLN
15.0000 mg/kg | Freq: Once | INTRAVENOUS | Status: AC
  Administered 2022-07-19: 600 mg via INTRAVENOUS
  Filled 2022-07-19: qty 16

## 2022-07-19 MED ORDER — SODIUM CHLORIDE 0.9 % IV SOLN: Freq: Once | INTRAVENOUS | Status: AC

## 2022-07-19 MED ORDER — SODIUM CHLORIDE 0.9% FLUSH
10.0000 mL | Freq: Once | INTRAVENOUS | Status: AC
  Administered 2022-07-19: 10 mL

## 2022-07-19 MED ORDER — SODIUM CHLORIDE 0.9 % IV SOLN
1200.0000 mg | Freq: Once | INTRAVENOUS | Status: AC
  Administered 2022-07-19: 1200 mg via INTRAVENOUS
  Filled 2022-07-19: qty 20

## 2022-07-19 NOTE — Patient Instructions (Signed)
[facility]  Discharge Instructions: Thank you for choosing [facility] to provide your oncology and hematology care.   If you have a lab appointment with the [facility], please go directly to the [facility] and check in at the registration area.   Wear comfortable clothing and clothing appropriate for easy access to any Portacath or PICC line.   We strive to give you quality time with your provider. You may need to reschedule your appointment if you arrive late (15 or more minutes).  Arriving late affects you and other patients whose appointments are after yours.  Also, if you miss three or more appointments without notifying the office, you may be dismissed from the clinic at the provider's discretion.      For prescription refill requests, have your pharmacy contact our office and allow 72 hours for refills to be completed.    Today you received the following chemotherapy and/or immunotherapy agents: Tecentriq, Bevacizumab.       To help prevent nausea and vomiting after your treatment, we encourage you to take your nausea medication as directed.  BELOW ARE SYMPTOMS THAT SHOULD BE REPORTED IMMEDIATELY: *FEVER GREATER THAN 100.4 F (38 C) OR HIGHER *CHILLS OR SWEATING *NAUSEA AND VOMITING THAT IS NOT CONTROLLED WITH YOUR NAUSEA MEDICATION *UNUSUAL SHORTNESS OF BREATH *UNUSUAL BRUISING OR BLEEDING *URINARY PROBLEMS (pain or burning when urinating, or frequent urination) *BOWEL PROBLEMS (unusual diarrhea, constipation, pain near the anus) TENDERNESS IN MOUTH AND THROAT WITH OR WITHOUT PRESENCE OF ULCERS (sore throat, sores in mouth, or a toothache) UNUSUAL RASH, SWELLING OR PAIN  UNUSUAL VAGINAL DISCHARGE OR ITCHING   Items with * indicate a potential emergency and should be followed up as soon as possible or go to the Emergency Department if any problems should occur.  Please show the CHEMOTHERAPY ALERT CARD or IMMUNOTHERAPY ALERT CARD  at check-in to the Emergency Department and triage nurse.  Should you have questions after your visit or need to cancel or reschedule your appointment, please contact [facility]  Dept: [phone]  and follow the prompts.  Office hours are 8:00 a.m. to 4:30 p.m. Monday - Friday. Please note that voicemails left after 4:00 p.m. may not be returned until the following business day.  We are closed weekends and major holidays. You have access to a nurse at all times for urgent questions. Please call the main number to the clinic Dept: [phone] and follow the prompts.   For any non-urgent questions, you may also contact your provider using MyChart. We now offer e-Visits for anyone 74 and older to request care online for non-urgent symptoms. For details visit [email or web address].   Also download the MyChart app! Go to the app store, search "MyChart", open the app, select [facility], and log in with your MyChart username and password.  Masks are optional in the cancer centers. If you would like for your care team to wear a mask while they are taking care of you, please let them know. You may have one support person who is at least 55 years old accompany you for your appointments.

## 2022-07-20 LAB — T4: T4, Total: 6.6 ug/dL (ref 4.5–12.0)

## 2022-07-22 ENCOUNTER — Telehealth: Payer: Self-pay | Admitting: Hematology

## 2022-07-22 NOTE — Telephone Encounter (Signed)
Left message with follow-up appointments per 8/21 los.

## 2022-07-23 ENCOUNTER — Other Ambulatory Visit: Payer: Self-pay

## 2022-07-28 DIAGNOSIS — C50919 Malignant neoplasm of unspecified site of unspecified female breast: Secondary | ICD-10-CM | POA: Diagnosis not present

## 2022-07-28 DIAGNOSIS — C220 Liver cell carcinoma: Secondary | ICD-10-CM | POA: Diagnosis not present

## 2022-07-28 DIAGNOSIS — R180 Malignant ascites: Secondary | ICD-10-CM | POA: Diagnosis not present

## 2022-07-30 ENCOUNTER — Other Ambulatory Visit: Payer: Self-pay

## 2022-07-31 ENCOUNTER — Other Ambulatory Visit: Payer: Self-pay | Admitting: Nurse Practitioner

## 2022-08-02 ENCOUNTER — Other Ambulatory Visit: Payer: Self-pay

## 2022-08-05 ENCOUNTER — Other Ambulatory Visit: Payer: Self-pay | Admitting: Hematology

## 2022-08-05 ENCOUNTER — Other Ambulatory Visit: Payer: Self-pay

## 2022-08-05 DIAGNOSIS — C50919 Malignant neoplasm of unspecified site of unspecified female breast: Secondary | ICD-10-CM | POA: Diagnosis not present

## 2022-08-05 DIAGNOSIS — R180 Malignant ascites: Secondary | ICD-10-CM | POA: Diagnosis not present

## 2022-08-05 DIAGNOSIS — C220 Liver cell carcinoma: Secondary | ICD-10-CM | POA: Diagnosis not present

## 2022-08-05 MED ORDER — PROMETHAZINE HCL 12.5 MG PO TABS
12.5000 mg | ORAL_TABLET | Freq: Every day | ORAL | 3 refills | Status: AC | PRN
Start: 2022-08-05 — End: ?

## 2022-08-06 ENCOUNTER — Other Ambulatory Visit: Payer: Self-pay | Admitting: Hematology

## 2022-08-06 ENCOUNTER — Ambulatory Visit (INDEPENDENT_AMBULATORY_CARE_PROVIDER_SITE_OTHER): Payer: BC Managed Care – PPO | Admitting: Thoracic Surgery (Cardiothoracic Vascular Surgery)

## 2022-08-06 ENCOUNTER — Other Ambulatory Visit: Payer: Self-pay

## 2022-08-06 DIAGNOSIS — J90 Pleural effusion, not elsewhere classified: Secondary | ICD-10-CM | POA: Diagnosis not present

## 2022-08-06 MED ORDER — OXYCODONE HCL 5 MG PO TABS
5.0000 mg | ORAL_TABLET | Freq: Four times a day (QID) | ORAL | 0 refills | Status: DC | PRN
Start: 2022-08-06 — End: 2022-09-03

## 2022-08-06 NOTE — Progress Notes (Signed)
     [address]       [place] [address]             [phone]       Patient: Home Provider: Office Consent for Telemedicine visit obtained.  Today's visit was completed via a real-time telehealth (see specific modality noted below). The patient/authorized person provided oral consent at the time of the visit to engage in a telemedicine encounter with the present provider at [facility]. The patient/authorized person was informed of the potential benefits, limitations, and risks of telemedicine. The patient/authorized person expressed understanding that the laws that protect confidentiality also apply to telemedicine. The patient/authorized person acknowledged understanding that telemedicine does not provide emergency services and that he or she would need to call 911 or proceed to the nearest hospital for help if such a need arose.   Total time spent in the clinical discussion 10 minutes.  Telehealth Modality: Phone visit (audio only)  I had a telephone visit with Theresa Rocha.  She continues to drain a liter per day.  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  In the setting management essentially would be best option given her frailty.  She is currently receiving chemotherapy and is scheduled for another dose week.  I explained to her that there are no plans for the removal of the Pleurx in the setting.

## 2022-08-08 ENCOUNTER — Other Ambulatory Visit: Payer: Self-pay | Admitting: Hematology

## 2022-08-08 DIAGNOSIS — C220 Liver cell carcinoma: Secondary | ICD-10-CM

## 2022-08-09 ENCOUNTER — Encounter: Payer: Self-pay | Admitting: Hematology

## 2022-08-09 ENCOUNTER — Telehealth: Payer: Self-pay | Admitting: Hematology

## 2022-08-09 ENCOUNTER — Other Ambulatory Visit: Payer: Self-pay

## 2022-08-09 ENCOUNTER — Inpatient Hospital Stay (HOSPITAL_BASED_OUTPATIENT_CLINIC_OR_DEPARTMENT_OTHER): Payer: BC Managed Care – PPO | Admitting: Hematology

## 2022-08-09 ENCOUNTER — Inpatient Hospital Stay: Payer: BC Managed Care – PPO | Attending: Hematology

## 2022-08-09 ENCOUNTER — Inpatient Hospital Stay: Payer: BC Managed Care – PPO

## 2022-08-09 VITALS — HR 92

## 2022-08-09 VITALS — BP 94/66 | HR 101 | Temp 97.5°F | Ht 63.0 in | Wt 83.8 lb

## 2022-08-09 DIAGNOSIS — Z5112 Encounter for antineoplastic immunotherapy: Secondary | ICD-10-CM | POA: Diagnosis not present

## 2022-08-09 DIAGNOSIS — C50111 Malignant neoplasm of central portion of right female breast: Secondary | ICD-10-CM

## 2022-08-09 DIAGNOSIS — C220 Liver cell carcinoma: Secondary | ICD-10-CM | POA: Insufficient documentation

## 2022-08-09 DIAGNOSIS — Z170 Estrogen receptor positive status [ER+]: Secondary | ICD-10-CM

## 2022-08-09 DIAGNOSIS — Z79899 Other long term (current) drug therapy: Secondary | ICD-10-CM | POA: Diagnosis not present

## 2022-08-09 DIAGNOSIS — Z79811 Long term (current) use of aromatase inhibitors: Secondary | ICD-10-CM | POA: Diagnosis not present

## 2022-08-09 LAB — CBC WITH DIFFERENTIAL (CANCER CENTER ONLY)
Abs Immature Granulocytes: 0.07 10*3/uL (ref 0.00–0.07)
Basophils Absolute: 0.1 10*3/uL (ref 0.0–0.1)
Basophils Relative: 1 %
Eosinophils Absolute: 0 10*3/uL (ref 0.0–0.5)
Eosinophils Relative: 0 %
HCT: 32.7 % — ABNORMAL LOW (ref 36.0–46.0)
Hemoglobin: 10.7 g/dL — ABNORMAL LOW (ref 12.0–15.0)
Immature Granulocytes: 1 %
Lymphocytes Relative: 6 %
Lymphs Abs: 0.6 10*3/uL — ABNORMAL LOW (ref 0.7–4.0)
MCH: 24.5 pg — ABNORMAL LOW (ref 26.0–34.0)
MCHC: 32.7 g/dL (ref 30.0–36.0)
MCV: 75 fL — ABNORMAL LOW (ref 80.0–100.0)
Monocytes Absolute: 1.6 10*3/uL — ABNORMAL HIGH (ref 0.1–1.0)
Monocytes Relative: 16 %
Neutro Abs: 7.9 10*3/uL — ABNORMAL HIGH (ref 1.7–7.7)
Neutrophils Relative %: 76 %
Platelet Count: 98 10*3/uL — ABNORMAL LOW (ref 150–400)
RBC: 4.36 MIL/uL (ref 3.87–5.11)
RDW: 21.5 % — ABNORMAL HIGH (ref 11.5–15.5)
WBC Count: 10.3 10*3/uL (ref 4.0–10.5)
nRBC: 0 % (ref 0.0–0.2)

## 2022-08-09 LAB — CMP (CANCER CENTER ONLY)
ALT: 22 U/L (ref 0–44)
AST: 37 U/L (ref 15–41)
Albumin: 2.5 g/dL — ABNORMAL LOW (ref 3.5–5.0)
Alkaline Phosphatase: 77 U/L (ref 38–126)
Anion gap: 4 — ABNORMAL LOW (ref 5–15)
BUN: 40 mg/dL — ABNORMAL HIGH (ref 6–20)
CO2: 19 mmol/L — ABNORMAL LOW (ref 22–32)
Calcium: 8.1 mg/dL — ABNORMAL LOW (ref 8.9–10.3)
Chloride: 99 mmol/L (ref 98–111)
Creatinine: 1.01 mg/dL — ABNORMAL HIGH (ref 0.44–1.00)
GFR, Estimated: >60 mL/min (ref >60)
Glucose, Bld: 150 mg/dL — ABNORMAL HIGH (ref 70–99)
Potassium: 5.7 mmol/L — ABNORMAL HIGH (ref 3.5–5.1)
Sodium: 122 mmol/L — ABNORMAL LOW (ref 135–145)
Total Bilirubin: 1.4 mg/dL — ABNORMAL HIGH (ref 0.3–1.2)
Total Protein: 5.7 g/dL — ABNORMAL LOW (ref 6.5–8.1)

## 2022-08-09 LAB — TSH: TSH: 4.066 u[IU]/mL (ref 0.350–4.500)

## 2022-08-09 LAB — TOTAL PROTEIN, URINE DIPSTICK: Protein, ur: <30 mg/dL — AB

## 2022-08-09 MED ORDER — SODIUM CHLORIDE 0.9% FLUSH
10.0000 mL | INTRAVENOUS | Status: DC | PRN
  Administered 2022-08-09: 10 mL

## 2022-08-09 MED ORDER — SODIUM CHLORIDE 0.9% FLUSH
10.0000 mL | Freq: Once | INTRAVENOUS | Status: AC
  Administered 2022-08-09: 10 mL

## 2022-08-09 MED ORDER — SODIUM CHLORIDE 0.9 % IV SOLN
15.0000 mg/kg | Freq: Once | INTRAVENOUS | Status: AC
  Administered 2022-08-09: 600 mg via INTRAVENOUS
  Filled 2022-08-09: qty 16

## 2022-08-09 MED ORDER — SODIUM CHLORIDE 0.9 % IV SOLN
1200.0000 mg | Freq: Once | INTRAVENOUS | Status: AC
  Administered 2022-08-09: 1200 mg via INTRAVENOUS
  Filled 2022-08-09: qty 20

## 2022-08-09 MED ORDER — HEPARIN SOD (PORK) LOCK FLUSH 100 UNIT/ML IV SOLN
500.0000 [IU] | Freq: Once | INTRAVENOUS | Status: AC | PRN
  Administered 2022-08-09: 500 [IU]

## 2022-08-09 MED ORDER — SODIUM CHLORIDE 0.9 % IV SOLN: Freq: Once | INTRAVENOUS | Status: AC

## 2022-08-09 NOTE — Patient Instructions (Signed)
[facility]  Discharge Instructions: Thank you for choosing [facility] to provide your oncology and hematology care.   If you have a lab appointment with the [facility], please go directly to the [facility] and check in at the registration area.   Wear comfortable clothing and clothing appropriate for easy access to any Portacath or PICC line.   We strive to give you quality time with your provider. You may need to reschedule your appointment if you arrive late (15 or more minutes).  Arriving late affects you and other patients whose appointments are after yours.  Also, if you miss three or more appointments without notifying the office, you may be dismissed from the clinic at the provider's discretion.      For prescription refill requests, have your pharmacy contact our office and allow 72 hours for refills to be completed.    Today you received the following chemotherapy and/or immunotherapy agents: bevacizumab, atezolizumab      To help prevent nausea and vomiting after your treatment, we encourage you to take your nausea medication as directed.  BELOW ARE SYMPTOMS THAT SHOULD BE REPORTED IMMEDIATELY: *FEVER GREATER THAN 100.4 F (38 C) OR HIGHER *CHILLS OR SWEATING *NAUSEA AND VOMITING THAT IS NOT CONTROLLED WITH YOUR NAUSEA MEDICATION *UNUSUAL SHORTNESS OF BREATH *UNUSUAL BRUISING OR BLEEDING *URINARY PROBLEMS (pain or burning when urinating, or frequent urination) *BOWEL PROBLEMS (unusual diarrhea, constipation, pain near the anus) TENDERNESS IN MOUTH AND THROAT WITH OR WITHOUT PRESENCE OF ULCERS (sore throat, sores in mouth, or a toothache) UNUSUAL RASH, SWELLING OR PAIN  UNUSUAL VAGINAL DISCHARGE OR ITCHING   Items with * indicate a potential emergency and should be followed up as soon as possible or go to the Emergency Department if any problems should occur.  Please show the CHEMOTHERAPY ALERT CARD or IMMUNOTHERAPY ALERT CARD  at check-in to the Emergency Department and triage nurse.  Should you have questions after your visit or need to cancel or reschedule your appointment, please contact [facility]  Dept: [phone]  and follow the prompts.  Office hours are 8:00 a.m. to 4:30 p.m. Monday - Friday. Please note that voicemails left after 4:00 p.m. may not be returned until the following business day.  We are closed weekends and major holidays. You have access to a nurse at all times for urgent questions. Please call the main number to the clinic Dept: [phone] and follow the prompts.   For any non-urgent questions, you may also contact your provider using MyChart. We now offer e-Visits for anyone 26 and older to request care online for non-urgent symptoms. For details visit [email or web address].   Also download the MyChart app! Go to the app store, search "MyChart", open the app, select [facility], and log in with your MyChart username and password.  Masks are optional in the cancer centers. If you would like for your care team to wear a mask while they are taking care of you, please let them know. You may have one support person who is at least 55 years old accompany you for your appointments.

## 2022-08-09 NOTE — Progress Notes (Signed)
[place]   [phone] [phone]   Clinic Follow up Note   Patient Care Team: Seward Carol, MD as PCP - General (Internal Medicine) Arna Snipe, RN as Oncology Nurse Navigator Irene Shipper, MD as Consulting Physician (Gastroenterology) Truitt Merle, MD as Consulting Physician (Hematology) Alla Feeling, NP as Nurse Practitioner (Nurse Practitioner) Stark Klein, MD as Consulting Physician (General Surgery) Aletta Edouard, MD as Consulting Physician (Interventional Radiology) Roosevelt Locks, CRNP as Nurse Practitioner (Nurse Practitioner) Mauro Kaufmann, RN as Oncology Nurse Navigator Rockwell Germany, RN as Oncology Nurse Navigator Eppie Gibson, MD as Attending Physician (Radiation Oncology) Trula Slade, DPM as Consulting Physician (Podiatry)  Date of Service:  08/09/2022  CHIEF COMPLAINT: f/u of multifocal [place], right breast cancer  CURRENT THERAPY:  Tecentriq/Bevacizumab, q21d, starting 06/29/22  ASSESSMENT & PLAN:  Theresa Rocha is a 56 y.o. female with   1. Multifocal Hepatocellular carcinoma, cT2N0Mx, stage II -presented with abdominal pain and rectal bleeding in 06/2019. Work up scans showed multifocal lesions of liver consistent with [place] with indeterminate right lobe lesions. AFP was WNL. -not a candidate for transplant or surgery -s/p left lobe ablation on 09/16/21, and right lobe Y90 on 12/22/21 with Dr. Kathlene Cote. -Liver MRI from 06/06/22 showed cancer progression of on previously treated lesion in segment 7 with extra capsular extension and adenopathy, consistent with disease progression. Recommendation is for systemic treatment. -She began Tecentriq and bevacizumab on 06/29/22, she tolerated first cycle treatment well.  She overall feels better after treatment. -labs reviewed, urine protein <30, hgb stable at 10.7, plt 98k. Will continue treatment.  2. Symptom Management: Fatigue, Weakness, SOB -she reports new fatigue,  weakness, SOB, and dry cough starting 08/06/22. She notes she was feeling well until this and is improved some today. -we discussed obtaining a chest x-ray, but she is scheduled for PET on 9/15. We will hold on x-ray for now.   3. Recurrent left Pleural Effusion -occurred following liver ablation on 09/16/21 -cytology from several thoracentesis were all negative. -PET scan 12/15/21 negative for thoracic metastasis -she required PleurX catheter placement 03/09/22.  -per Dr. Kipp Brood, she is not a candidate for pleurodesis -abdomen MRI 06/06/22 showed nodularity in the left pleura, concerning for malignancy -she is scheduled for [place] PET scan on 08/13/22   4. Hyponatremia, Hypotension  -Refractory, secondary to her liver cirrhosis and high output from thoracic Pleurx -sodium 122 today (08/09/22), will give NS 1L today or in next few days    5. 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 Due to knee joint pain, she was switched to Tamoxifen in 03/2020. She is tolerating with hot flashes.  -most recent mammogram from 05/13/22 showed breast calcification in right side. Biopsy on hold. -Tamoxifen held since 06/15/22 for work up of recurrent plural effusion      PLAN:  -proceed with tecentriq/beva today, will add IVF today or in next few days  -[place] 9/15 -phone visit 9/18 to discuss scan results -lab, flush, f/u, and tecentriq/beva on 10/2   No problem-specific Assessment & Plan notes found for this encounter.   SUMMARY OF ONCOLOGIC HISTORY: Oncology History Overview Note  Cancer Staging Hepatocellular carcinoma ([place]) Staging form: Liver, AJCC 8th Edition - Clinical stage from 08/02/2019: Stage II (cT2, cN0, cM0) - Signed by  Truitt Merle, MD on 08/02/2019  Malignant neoplasm of central  portion of right breast in female, estrogen receptor positive [facility]) Staging form: Breast, AJCC 8th Edition - Clinical stage from 08/29/2019: Stage IB (cT2, cN0, cM0, G2, ER+, PR+, HER2-) - Signed by Truitt Merle, MD on 09/05/2019    Hepatocellular carcinoma ([place])  07/04/2019 Imaging   CT AP IMPRESSION: 1. Findings of cirrhosis and hepatomegaly.  Patent portal vein. 2. Numerous varicosities as described above. 3. Moderate to large amount of abdominopelvic ascites 4. Contrast filled appendix which appears to be a mildly dilated with apparent wall thickening which could be due to surrounding ascites.   07/09/2019 Imaging   ABD US IMPRESSION: 1. Three small hypoechoic lesions in the RIGHT hepatic lobe measuring less than 1 cm but not evident on comparison contrast enhanced CT from 07/04/2019. Recommend MRI of the abdomen without and with contrast to evaluate these hepatic lesions in cirrhotic patient. 2. Increased liver echogenicity and lobular contour consistent cirrhosis. 3. Moderate mild to moderate volume of ascites. 4. Mild gallbladder wall thickening likely related to ascites. 5. No cholelithiasis or cholecystitis evident   07/26/2019 Imaging   MR ABD IMPRESSION: 1. In the lateral segment left hepatic lobe there are two LI-RADS category LR-5 lesions representing hepatocellular carcinoma. 2. There is also a LI-RADS category LR-3 lesion peripherally in the right hepatic lobe, intermediate probability for hepatocellular carcinoma. 3. Hepatic cirrhosis and diffuse wall thickening of the gallbladder. Widespread steatosis in the liver. 4. 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 5.  Aortic Atherosclerosis (ICD10-I70.0). 6. Widespread ascites and mesenteric edema.   07/26/2019 Tumor Marker   AFP 5.5 (baseline)   08/02/2019 Initial Diagnosis   Hepatocellular carcinoma ([place])   08/02/2019 Cancer Staging   Staging form: Liver, AJCC 8th Edition - Clinical stage from  08/02/2019: Stage II (cT2, cN0, cM0) - Signed by Truitt Merle, MD on 08/02/2019   08/07/2019 Imaging   CT Chest 08/07/19 IMPRESSION: 1. Tiny bilateral pulmonary nodules measuring up to 6 mm. These are too small to definitively characterize. Given the history of HCC, close follow-up recommended to ensure stability. 2.  Emphysema. (ICD10-J43.9)   08/09/2019 Procedure   Upper Endoscopy by Dr Henrene Pastor 08/09/19  IMPRESSION 1. Small esophageal varices 2. Mild diffuse portal hypertensive gastropathy 3. Otherwise normal EGD.   02/04/2020 Imaging   MRI abdomen  IMPRESSION: Stable exam. The small segment II lesions identified previously as LI-RADS category 5 are stable in size.   Previously characterized tiny LI-RADS 3 lesion in the inferior right liver is stable.   No new suspicious focal hepatic lesion on a background liver morphology consistent with cirrhosis.   Interval decrease in ascites with decrease and diffuse gallbladder wall thickening.     06/18/2020 Imaging   MRI Abdomen  IMPRESSION: 1. Stable 1.1 x 0.9 cm rounded lesion in the medial left lobe of the liver, hepatic segment II, demonstrating arterial phase hyperenhancement without washout or capsule. This has previously been described as a LI-RADS category 5 lesion based on prior observations, if categorized by current imaging features this is consistent with LI-RADS category 4. 2. Stable 1.5 x 1.2 cm lesion lateral and inferior to this lesion in hepatic segment II with very subtle evidence of washout but without capsular enhancement. LI-RADS category 5. 3. Stable 5 mm focus of early arterial phase hyperenhancement of the inferior right lobe of the liver, hepatic segment VI, without evidence of washout or capsule. This remains consistent with LI-RADS category 3. 4. No new  suspicious lesions or contrast enhancement. 5. Cirrhotic morphology of the liver. 6. Trace perihepatic ascites.     10/30/2021 Imaging   EXAM: MRI ABDOMEN  WITHOUT AND WITH CONTRAST  IMPRESSION: 1. Ablation defect of the left lobe of the liver which subtends two lesions previously noted in hepatic segment II. No evidence of residual contrast enhancement to suggest viable tumor.  2. Significant interval increase in size of a hyperenhancing lesion of the posterior liver dome, hepatic segment VII, measuring 4.0 x 3.7 cm, previously 2.6 x 2.5 cm when measured similarly. This demonstrates some heterogeneous contrast enhancement although no clear evidence of washout or capsular enhancement. Findings remain LI-RADS category 5, consistent with hepatocellular carcinoma. 3. Slight interval increase in size of a hyperenhancing lesion of the inferior tip of the right lobe of the liver, hepatic segment VI, measuring 1.1 x 0.7 cm, previously no greater than 0.6 cm, as well as an additional adjacent lesion measuring 0.6 cm, previously 0.4 cm. Given threshold growth, these lesions are upstaged to LI-RADS category 4 and suspicious for additional small foci of hepatocellular carcinoma. 4. Cirrhotic morphology of the liver and hepatic steatosis. 5. Small volume ascites throughout the abdomen. 6. Large left, small right pleural effusions and associated atelectasis or consolidation. 7. Layering sludge in the gallbladder with mild gallbladder wall thickening and pericholecystic fluid, similar to prior examination although nonspecific in the setting of ascites. No discrete gallstones.   11/10/2021 Imaging   EXAM: CT ABDOMEN WITHOUT AND WITH CONTRAST  IMPRESSION: Post treatment changes about the LEFT hepatic lobe lesion with variable arterial enhancement not showing washout and with non masslike features, favor perfusional defect, characterized as TR equivocal but favor nonviable.   Lesion in the posterior RIGHT hepatic lobe over time with enlargement and with serpiginous internal low signal on MR and low attenuation on CT. Findings categorized as LR M given signs of rim  like enhancement on previous imaging and infiltrative appearance. Potentially infiltrative HCC but without specific imaging features of hepatocellular carcinoma by LI-RADS.   Enlarging lesion in the inferior RIGHT hepatic lobe greater than 50% diameter increase over 6 month interval, LI-RADS category 5 showing non rim arterial phase enhancement.   Additional foci of arterial phase enhancement without definitive washout, categorized as LI-RADS category 3 but in aggregate are more suspicious for small foci of hepatocellular carcinoma, attention on follow-up.   11/19/2021 Pathology Results   A. PLEURAL FLUID, LEFT, THORACENTESIS:   FINAL MICROSCOPIC DIAGNOSIS:  - No malignant cells identified  - Reactive mesothelial cells present    12/15/2021 PET scan   IMPRESSION: 1. Intensely hypermetabolic mass in the posterior RIGHT hepatic lobe consistent with known hepatocellular carcinoma. Smaller lesions identified on MRI are not hypermetabolic. 2. No evidence of metastatic lymphadenopathy or distant metastatic disease. 3. Elongated nodular thickening in the lingula is favored benign. Moderate LEFT pleural effusion.   06/29/2022 - 07/19/2022 Chemotherapy   Patient is on Treatment Plan : LUNG Atezolizumab + Bevacizumab q21d Maintenance     07/09/2022 -  Chemotherapy   Patient is on Treatment Plan : LUNG Atezolizumab + Bevacizumab Maintenance q21d     Malignant neoplasm of central portion of right breast in female, estrogen receptor positive ([place])  08/29/2019 Cancer Staging   Staging form: Breast, AJCC 8th Edition - Clinical stage from 08/29/2019: Stage IB (cT2, cN0, cM0, G2, ER+, PR+, HER2-) - Signed by Truitt Merle, MD on 09/05/2019   08/29/2019 Mammogram   Diagnostic mammogram 08/29/19 IMPRESSION: Suspicious mass in the 12 o'clock  region of the right breast 3 cm from the nipple measuring 3.2 x 1.6 x 2.6 cm   08/29/2019 Initial Biopsy   Diagnosis 08/29/19 Breast, right, needle core biopsy, 12  o'clock - INVASIVE DUCTAL CARCINOMA, SEE COMMENT.   08/29/2019 Receptors her2   Results: IMMUNOHISTOCHEMICAL AND MORPHOMETRIC ANALYSIS PERFORMED MANUALLY The tumor cells are NEGATIVE for Her2 (1+). Estrogen Receptor: 100%, POSITIVE, STRONG STAINING INTENSITY Progesterone Receptor: 90%, POSITIVE, STRONG STAINING INTENSITY Proliferation Marker Ki67: 15%   09/05/2019 Initial Diagnosis   Malignant neoplasm of central portion of right breast in female, estrogen receptor positive ([place])   09/13/2019 Breast MRI   MRI breast 09/13/19 IMPRESSION: 1. Irregular spiculated enhancing mass in the right breast representing the patient's known malignancy. No abnormal right axillary lymph nodes. 2. Prominent left internal mammary lymph node is indeterminate. No abnormal appearing right internal mammary lymph nodes. 3. No evidence of malignancy in the left breast.   10/16/2019 Cancer Staging   Staging form: Breast, AJCC 8th Edition - Pathologic stage from 10/16/2019: Stage IA (pT2, pN0, cM0, G2, ER+, PR+, HER2-, Oncotype DX score: 21) - Signed by Gardenia Phlegm, NP on 11/07/2019   10/16/2019 Oncotype testing   Oncotype 10/16/19 Recurrence score 21 with 7% benefit of Tamoxifen or AI. There is less than 1 % benefit of chemo.    10/16/2019 Surgery   RIGHT BREAST LUMPECTOMY WITH SENTINEL LYMPH NODE BIOPSY by Dr Barry Dienes 10/16/19    10/16/2019 Pathology Results   FINAL MICROSCOPIC DIAGNOSIS: 10/16/19  A. BREAST, RIGHT, LUMPECTOMY:  - Invasive ductal carcinoma, 2.3 cm, Nottingham grade 2 of 3.  - Margins of resection are not involved (Closest margin: 1 mm,  anterior).  - See oncology table.   B. SENTINEL LYMPH NODE, RIGHT AXILLARY #1, BIOPSY:  - One lymph node, negative for carcinoma (0/1).   C. SENTINEL LYMPH NODE, RIGHT AXILLARY #2, BIOPSY:  - One lymph node, negative for carcinoma (0/1).    09/2019 -  Anti-estrogen oral therapy   Anastrozole 50m daily started in Nov 2020. Due to  worsened knee pain/arthritis, she was switched to Tamoxifen in 03/2020.    12/03/2019 - 12/31/2019 Radiation Therapy   Adjuvant RT with Dr SIsidore Moos1/4/21-12/31/19   04/01/2020 Survivorship   SCP delivered by virtual visit per LCira Rue NP       INTERVAL HISTORY:  TELAIN WIXONis here for [place] She was last seen by NP Lacie on 07/19/22. She presents to the clinic alone. She reports she has been experiencing fatigue, weakness, and a new breathing difficulty. She also reports a mild dry cough, denies pain. She explains she was feeling good until last Friday, and she is feeling better some today.   All other systems were reviewed with the patient and are negative.  MEDICAL HISTORY:  Past Medical History:  Diagnosis Date   Anemia    Breast cancer ([place] 08/30/2019   Cancer ([facility]    liver- just diagnosed, not started treatment yet   Cervicalgia    COPD (chronic obstructive pulmonary disease) (H[facility])    Esophageal varices ([facility])    GERD (gastroesophageal reflux disease)    Headache    Hepatic cirrhosis ([facility])    Hepatitis C    resolved 2018   Hepatocellular carcinoma ([facility])    IBS (irritable bowel syndrome)    Lactose intolerance    Personal history of radiation therapy 11/2019   Right breast   Portal hypertension ([place]    Raynaud's syndrome  Thrombocytopenia ([place])     SURGICAL HISTORY: Past Surgical History:  Procedure Laterality Date   BREAST BIOPSY Right 09/2019   BREAST LUMPECTOMY Right 09/2019   BREAST LUMPECTOMY WITH AXILLARY LYMPH NODE BIOPSY Right 10/16/2019   Procedure: RIGHT BREAST LUMPECTOMY WITH SENTINEL LYMPH NODE BIOPSY;  Surgeon: Stark Klein, MD;  Location: [facility];  Service: General;  Laterality: Right;   COLONOSCOPY     ESOPHAGEAL BANDING  10/27/2021   Procedure: ESOPHAGEAL BANDING;  Surgeon: Irene Shipper, MD;  Location: [facility];  Service: Endoscopy;;   ESOPHAGOGASTRODUODENOSCOPY (EGD) WITH PROPOFOL N/A 10/27/2021   Procedure:  ESOPHAGOGASTRODUODENOSCOPY (EGD) WITH PROPOFOL;  Surgeon: Irene Shipper, MD;  Location: [facility];  Service: Endoscopy;  Laterality: N/A;   ESOPHAGOGASTRODUODENOSCOPY (EGD) WITH PROPOFOL N/A 01/04/2022   Procedure: ESOPHAGOGASTRODUODENOSCOPY (EGD) WITH PROPOFOL;  Surgeon: Irene Shipper, MD;  Location: [facility];  Service: Endoscopy;  Laterality: N/A;   HAMMER TOE SURGERY Left 2012   pins placed in great toe, 2nd,3rd, 4th toes, all pins removed except great toe   IR ANGIOGRAM SELECTIVE EACH ADDITIONAL VESSEL  12/16/2021   IR ANGIOGRAM SELECTIVE EACH ADDITIONAL VESSEL  12/16/2021   IR ANGIOGRAM SELECTIVE EACH ADDITIONAL VESSEL  12/22/2021   IR ANGIOGRAM VISCERAL SELECTIVE  12/16/2021   IR ANGIOGRAM VISCERAL SELECTIVE  12/22/2021   IR EMBO TUMOR ORGAN ISCHEMIA INFARCT INC GUIDE ROADMAPPING  12/22/2021   IR IMAGING GUIDED PORT INSERTION  07/07/2022   IR PARACENTESIS  11/13/2019   IR PARACENTESIS  03/04/2022   IR PATIENT EVAL TECH 0-60 MINS  05/19/2022   IR PERC PLEURAL DRAIN W/INDWELL CATH W/IMG GUIDE  03/09/2022   IR RADIOLOGIST EVAL & MGMT  08/16/2019   IR RADIOLOGIST EVAL & MGMT  02/06/2020   IR RADIOLOGIST EVAL & MGMT  06/26/2020   IR RADIOLOGIST EVAL & MGMT  12/31/2020   IR RADIOLOGIST EVAL & MGMT  07/21/2021   IR RADIOLOGIST EVAL & MGMT  10/08/2021   IR RADIOLOGIST EVAL & MGMT  01/13/2022   IR RADIOLOGIST EVAL & MGMT  03/05/2022   IR THORACENTESIS ASP PLEURAL SPACE W/IMG GUIDE  10/12/2021   IR THORACENTESIS ASP PLEURAL SPACE W/IMG GUIDE  10/26/2021   IR THORACENTESIS ASP PLEURAL SPACE W/IMG GUIDE  11/19/2021   IR THORACENTESIS ASP PLEURAL SPACE W/IMG GUIDE  12/22/2021   IR US GUIDE VASC ACCESS RIGHT  12/16/2021   IR US GUIDE VASC ACCESS RIGHT  12/22/2021   PELVIC LAPAROSCOPY Bilateral 1992   RADIOLOGY WITH ANESTHESIA N/A 09/16/2021   Procedure: CT MICROWAVE ABLATION;  Surgeon: Aletta Edouard, MD;  Location: [facility];  Service: Radiology;  Laterality: N/A;   UPPER GASTROINTESTINAL ENDOSCOPY  2002   had  esophageal dilitation    I have reviewed the social history and family history with the patient and they are unchanged from previous note.  ALLERGIES:  is allergic to hydromorphone hcl and aspirin.  MEDICATIONS:  Current Outpatient Medications  Medication Sig Dispense Refill   baclofen (LIORESAL) 10 MG tablet Take 5 mg by mouth daily as needed for muscle spasms.     dicyclomine (BENTYL) 20 MG tablet Take 0.5 tablets (10 mg total) by mouth every 6 (six) hours as needed for spasms. Take one tablet every 6-8 hours as needed for abdominal cramping (Patient taking differently: Take 20 mg by mouth daily as needed (abdominal cramping).) 90 tablet 0   lidocaine-prilocaine (EMLA) cream Apply 1 Application topically as needed. 30 g 2   nicotine (NICODERM CQ - DOSED IN MG/24  HOURS) 21 mg/24hr patch Place 21 mg onto the skin daily as needed (smoking cessation on work days).     oxyCODONE (OXY IR/ROXICODONE) 5 MG immediate release tablet Take 1 tablet (5 mg total) by mouth every 6 (six) hours as needed for severe pain. 60 tablet 0   pantoprazole (PROTONIX) 40 MG tablet TAKE 1 TABLET BY MOUTH EVERY DAY 90 tablet 1   Prenatal Vit-Fe Fumarate-FA (PRENATAL PO) Take 1 tablet by mouth daily.     promethazine (PHENERGAN) 12.5 MG tablet Take 1 tablet (12.5 mg total) by mouth daily as needed for nausea or vomiting. 20 tablet 3   No current facility-administered medications for this visit.    PHYSICAL EXAMINATION: ECOG PERFORMANCE STATUS: 2 - Symptomatic, <50% confined to bed  Vitals:   08/09/22 1020  BP: 94/66  Pulse: (!) 101  Temp: (!) 97.5 F (36.4 C)  SpO2: 97%   Wt Readings from Last 3 Encounters:  08/09/22 83 lb 12.8 oz (38 kg)  07/19/22 84 lb 8 oz (38.3 kg)  06/29/22 87 lb 11.2 oz (39.8 kg)     GENERAL:alert, no distress and comfortable SKIN: skin color, texture, turgor are normal, no rashes or significant lesions EYES: normal, Conjunctiva are pink and non-injected, sclera clear  LYMPH:  no  palpable lymphadenopathy in the cervical, axillary  LUNGS: clear to auscultation and percussion with normal breathing effort, (+) fluid on left HEART: regular rate & rhythm and no murmurs and no lower extremity edema ABDOMEN: soft, non-tender and normal bowel sounds Musculoskeletal:no cyanosis of digits and no clubbing  NEURO: alert & oriented x 3 with fluent speech, no focal motor/sensory deficits  LABORATORY DATA:  I have reviewed the data as listed    Latest Ref Rng & Units 08/09/2022    9:53 AM 07/19/2022   10:01 AM 07/09/2022   10:55 AM  CBC  WBC 4.0 - 10.5 K/uL 10.3  7.1  7.1   Hemoglobin 12.0 - 15.0 g/dL 10.7  10.5  10.3   Hematocrit 36.0 - 46.0 % 32.7  32.5  31.2   Platelets 150 - 400 K/uL 98  153  85         Latest Ref Rng & Units 08/09/2022    9:53 AM 07/19/2022   10:01 AM 07/09/2022   10:55 AM  CMP  Glucose 70 - 99 mg/dL 150  131  106   BUN 6 - 20 mg/dL 40  24  19   Creatinine 0.44 - 1.00 mg/dL 1.01  0.71  0.73   Sodium 135 - 145 mmol/L 122  126  129   Potassium 3.5 - 5.1 mmol/L 5.7  4.8  3.7   Chloride 98 - 111 mmol/L 99  102  99   CO2 22 - 32 mmol/L _0 Calcium 8.9 - 10.3 mg/dL 8.1  8.1  7.7   Total Protein 6.5 - 8.1 g/dL 5.7  5.6  5.6   Total Bilirubin 0.3 - 1.2 mg/dL 1.4  1.3  1.8   Alkaline Phos 38 - 126 U/L 77  79  74   AST 15 - 41 U/L 37  40  35   ALT 0 - 44 U/L _1 RADIOGRAPHIC STUDIES: I have personally reviewed the radiological images as listed and agreed with the findings in the report. No results found.    No orders of the defined types were placed in this encounter.  All  questions were answered. The patient knows to call the clinic with any problems, questions or concerns. No barriers to learning was detected. The total time spent in the appointment was 30 minutes.     Truitt Merle, MD 08/09/2022   I, Wilburn Mylar, am acting as scribe for Truitt Merle, MD.   I have reviewed the above documentation for accuracy and  completeness, and I agree with the above.

## 2022-08-09 NOTE — Telephone Encounter (Signed)
Left message with follow-up appointment per 9/11 secure chat.

## 2022-08-09 NOTE — Progress Notes (Signed)
Per [name], okay to proceed with tx with plt of 98 today.

## 2022-08-10 LAB — T4: T4, Total: 6 ug/dL (ref 4.5–12.0)

## 2022-08-13 ENCOUNTER — Ambulatory Visit: Payer: BC Managed Care – PPO

## 2022-08-13 ENCOUNTER — Encounter (HOSPITAL_COMMUNITY)
Admission: RE | Admit: 2022-08-13 | Discharge: 2022-08-13 | Disposition: A | Discharge status: Home / Self Care | Payer: BC Managed Care – PPO | Source: Ambulatory Visit | Attending: Hematology | Admitting: Hematology

## 2022-08-13 DIAGNOSIS — C50111 Malignant neoplasm of central portion of right female breast: Secondary | ICD-10-CM | POA: Diagnosis not present

## 2022-08-13 DIAGNOSIS — Z170 Estrogen receptor positive status [ER+]: Secondary | ICD-10-CM | POA: Insufficient documentation

## 2022-08-13 DIAGNOSIS — Z8505 Personal history of malignant neoplasm of liver: Secondary | ICD-10-CM | POA: Diagnosis not present

## 2022-08-16 ENCOUNTER — Encounter: Payer: Self-pay | Admitting: Hematology

## 2022-08-16 ENCOUNTER — Inpatient Hospital Stay (HOSPITAL_BASED_OUTPATIENT_CLINIC_OR_DEPARTMENT_OTHER): Payer: BC Managed Care – PPO | Admitting: Hematology

## 2022-08-16 ENCOUNTER — Inpatient Hospital Stay: Payer: BC Managed Care – PPO

## 2022-08-16 ENCOUNTER — Other Ambulatory Visit: Payer: Self-pay

## 2022-08-16 DIAGNOSIS — C50111 Malignant neoplasm of central portion of right female breast: Secondary | ICD-10-CM

## 2022-08-16 DIAGNOSIS — Z170 Estrogen receptor positive status [ER+]: Secondary | ICD-10-CM | POA: Diagnosis not present

## 2022-08-16 DIAGNOSIS — C220 Liver cell carcinoma: Secondary | ICD-10-CM | POA: Diagnosis not present

## 2022-08-16 DIAGNOSIS — Z5112 Encounter for antineoplastic immunotherapy: Secondary | ICD-10-CM | POA: Diagnosis not present

## 2022-08-16 DIAGNOSIS — Z79899 Other long term (current) drug therapy: Secondary | ICD-10-CM | POA: Diagnosis not present

## 2022-08-16 DIAGNOSIS — Z79811 Long term (current) use of aromatase inhibitors: Secondary | ICD-10-CM | POA: Diagnosis not present

## 2022-08-16 DIAGNOSIS — R180 Malignant ascites: Secondary | ICD-10-CM | POA: Diagnosis not present

## 2022-08-16 DIAGNOSIS — C50919 Malignant neoplasm of unspecified site of unspecified female breast: Secondary | ICD-10-CM | POA: Diagnosis not present

## 2022-08-16 MED ORDER — ANASTROZOLE 1 MG PO TABS: 1.0000 mg | ORAL_TABLET | Freq: Every day | ORAL | 2 refills | Status: AC

## 2022-08-16 MED ORDER — PALBOCICLIB 125 MG PO TABS
125.0000 mg | ORAL_TABLET | Freq: Every day | ORAL | 0 refills | Status: DC
  Filled 2022-08-16: qty 21, 21d supply, fill #0

## 2022-08-16 MED ORDER — SODIUM CHLORIDE 0.9 % IV SOLN: Freq: Once | INTRAVENOUS | Status: AC

## 2022-08-16 MED ORDER — SODIUM CHLORIDE 0.9% FLUSH
10.0000 mL | Freq: Once | INTRAVENOUS | Status: AC | PRN
  Administered 2022-08-16: 10 mL

## 2022-08-16 MED ORDER — HEPARIN SOD (PORK) LOCK FLUSH 100 UNIT/ML IV SOLN
500.0000 [IU] | Freq: Once | INTRAVENOUS | Status: AC | PRN
  Administered 2022-08-16: 500 [IU]

## 2022-08-16 NOTE — Progress Notes (Signed)
[facility]   [phone] [phone]   Clinic Follow up Note   Patient Care Team: Seward Carol, MD as PCP - General (Internal Medicine) Arna Snipe, RN as Oncology Nurse Navigator Irene Shipper, MD as Consulting Physician (Gastroenterology) Truitt Merle, MD as Consulting Physician (Hematology) Alla Feeling, NP as Nurse Practitioner (Nurse Practitioner) Stark Klein, MD as Consulting Physician (General Surgery) Aletta Edouard, MD as Consulting Physician (Interventional Radiology) Roosevelt Locks, CRNP as Nurse Practitioner (Nurse Practitioner) Mauro Kaufmann, RN as Oncology Nurse Navigator Rockwell Germany, RN as Oncology Nurse Navigator Eppie Gibson, MD as Attending Physician (Radiation Oncology) Trula Slade, DPM as Consulting Physician (Podiatry)  Date of Service:  08/16/2022  CHIEF COMPLAINT: review PET scan results   CURRENT THERAPY:  Tecentriq/Bevacizumab, q21d, starting 06/29/22  ASSESSMENT & PLAN:  Theresa Rocha is a 56 y.o. female with   1. Multifocal Hepatocellular carcinoma, cT2N0Mx, stage II -presented with abdominal pain and rectal bleeding in 06/2019. Work up scans showed multifocal lesions of liver consistent with [place] with indeterminate right lobe lesions. AFP was WNL. -not a candidate for transplant or surgery -s/p left lobe ablation on 09/16/21, and right lobe Y90 on 12/22/21 with Dr. Kathlene Cote. -Liver MRI from 06/06/22 showed cancer progression of on previously treated lesion in segment 7 with extra capsular extension and adenopathy, consistent with disease progression. Recommendation is for systemic treatment. -She began Tecentriq and bevacizumab on 06/29/22, she has been tolerating therapy well. Her overall clinical condition has slightly improved, but not much better   2. 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 Due to knee joint pain, she was switched to Tamoxifen in 03/2020. She is tolerating with hot flashes.  -most recent mammogram from 05/13/22 showed breast calcification in right side. Biopsy on hold. -Tamoxifen held since 06/15/22 for work up of recurrent plural effusion  --Discussed her recent [place] PET scan results, which showed mild hypermetabolic left pleural surface, concerning for estrogen positive breast cancer with pleural metastasis -Her left pleural effusion has not improved since she started Tecentriq and bevacizumab, I recommend her to start anastrozole and Ibrance for metastatic breast cancer.  I reviewed the potential side effects, especially cytopenias and risk of infection, diarrhea, and fatigue, she agrees to proceed.  3. Recurrent left Pleural Effusion, probable recurrent breast cancer -occurred following liver ablation on 09/16/21 -cytology from several thoracentesis were all negative. -PET scan 12/15/21 negative for thoracic metastasis -she required PleurX catheter placement 03/09/22.  -per Dr. Kipp Brood, she is not a candidate for pleurodesis -abdomen MRI 06/06/22 showed nodularity in the left pleura, concerning for malignancy    4. Hyponatremia, Hypotension  -Refractory, secondary to her liver cirrhosis and high output from thoracic Pleurx -she still has intermittent hyponatremia, will give IV hydration as needed     PLAN:  -I discussed the findings from [place] PET scan, which supports metastatic breast cancer in left upper pleura  -I called in anastrozole and Ibrance today, plan to start as soon as she receives it -Lab, follow-up and Tecentriq, bevacizumab on 10/2 as scheduled    No problem-specific Assessment & Plan notes found for this encounter.   SUMMARY OF ONCOLOGIC HISTORY: Oncology History Overview Note  Cancer Staging Hepatocellular carcinoma ([place]) Staging form:  Liver, AJCC 8th Edition - Clinical stage from  08/02/2019: Stage II (cT2, cN0, cM0) - Signed by Truitt Merle, MD on 08/02/2019  Malignant neoplasm of central portion of right breast in female, estrogen receptor positive ([place]) Staging form: Breast, AJCC 8th Edition - Clinical stage from 08/29/2019: Stage IB (cT2, cN0, cM0, G2, ER+, PR+, HER2-) - Signed by Truitt Merle, MD on 09/05/2019    Hepatocellular carcinoma ([place])  07/04/2019 Imaging   CT AP IMPRESSION: 1. Findings of cirrhosis and hepatomegaly.  Patent portal vein. 2. Numerous varicosities as described above. 3. Moderate to large amount of abdominopelvic ascites 4. Contrast filled appendix which appears to be a mildly dilated with apparent wall thickening which could be due to surrounding ascites.   07/09/2019 Imaging   ABD US IMPRESSION: 1. Three small hypoechoic lesions in the RIGHT hepatic lobe measuring less than 1 cm but not evident on comparison contrast enhanced CT from 07/04/2019. Recommend MRI of the abdomen without and with contrast to evaluate these hepatic lesions in cirrhotic patient. 2. Increased liver echogenicity and lobular contour consistent cirrhosis. 3. Moderate mild to moderate volume of ascites. 4. Mild gallbladder wall thickening likely related to ascites. 5. No cholelithiasis or cholecystitis evident   07/26/2019 Imaging   MR ABD IMPRESSION: 1. In the lateral segment left hepatic lobe there are two LI-RADS category LR-5 lesions representing hepatocellular carcinoma. 2. There is also a LI-RADS category LR-3 lesion peripherally in the right hepatic lobe, intermediate probability for hepatocellular carcinoma. 3. Hepatic cirrhosis and diffuse wall thickening of the gallbladder. Widespread steatosis in the liver. 4. 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 5.  Aortic Atherosclerosis (ICD10-I70.0). 6. Widespread ascites and mesenteric edema.   07/26/2019 Tumor Marker   AFP 5.5 (baseline)    08/02/2019 Initial Diagnosis   Hepatocellular carcinoma ([place])   08/02/2019 Cancer Staging   Staging form: Liver, AJCC 8th Edition - Clinical stage from 08/02/2019: Stage II (cT2, cN0, cM0) - Signed by Truitt Merle, MD on 08/02/2019   08/07/2019 Imaging   CT Chest 08/07/19 IMPRESSION: 1. Tiny bilateral pulmonary nodules measuring up to 6 mm. These are too small to definitively characterize. Given the history of HCC, close follow-up recommended to ensure stability. 2.  Emphysema. (ICD10-J43.9)   08/09/2019 Procedure   Upper Endoscopy by Dr Henrene Pastor 08/09/19  IMPRESSION 1. Small esophageal varices 2. Mild diffuse portal hypertensive gastropathy 3. Otherwise normal EGD.   02/04/2020 Imaging   MRI abdomen  IMPRESSION: Stable exam. The small segment II lesions identified previously as LI-RADS category 5 are stable in size.   Previously characterized tiny LI-RADS 3 lesion in the inferior right liver is stable.   No new suspicious focal hepatic lesion on a background liver morphology consistent with cirrhosis.   Interval decrease in ascites with decrease and diffuse gallbladder wall thickening.     06/18/2020 Imaging   MRI Abdomen  IMPRESSION: 1. Stable 1.1 x 0.9 cm rounded lesion in the medial left lobe of the liver, hepatic segment II, demonstrating arterial phase hyperenhancement without washout or capsule. This has previously been described as a LI-RADS category 5 lesion based on prior observations, if categorized by current imaging features this is consistent with LI-RADS category 4. 2. Stable 1.5 x 1.2 cm lesion lateral and inferior to this lesion in hepatic segment II with very subtle evidence of washout but without capsular enhancement. LI-RADS category 5. 3. Stable 5 mm focus of early arterial phase hyperenhancement of the inferior right lobe of the liver, hepatic segment VI, without evidence of washout or capsule. This remains  consistent with LI-RADS category 3. 4. No new  suspicious lesions or contrast enhancement. 5. Cirrhotic morphology of the liver. 6. Trace perihepatic ascites.     10/30/2021 Imaging   EXAM: MRI ABDOMEN WITHOUT AND WITH CONTRAST  IMPRESSION: 1. Ablation defect of the left lobe of the liver which subtends two lesions previously noted in hepatic segment II. No evidence of residual contrast enhancement to suggest viable tumor.  2. Significant interval increase in size of a hyperenhancing lesion of the posterior liver dome, hepatic segment VII, measuring 4.0 x 3.7 cm, previously 2.6 x 2.5 cm when measured similarly. This demonstrates some heterogeneous contrast enhancement although no clear evidence of washout or capsular enhancement. Findings remain LI-RADS category 5, consistent with hepatocellular carcinoma. 3. Slight interval increase in size of a hyperenhancing lesion of the inferior tip of the right lobe of the liver, hepatic segment VI, measuring 1.1 x 0.7 cm, previously no greater than 0.6 cm, as well as an additional adjacent lesion measuring 0.6 cm, previously 0.4 cm. Given threshold growth, these lesions are upstaged to LI-RADS category 4 and suspicious for additional small foci of hepatocellular carcinoma. 4. Cirrhotic morphology of the liver and hepatic steatosis. 5. Small volume ascites throughout the abdomen. 6. Large left, small right pleural effusions and associated atelectasis or consolidation. 7. Layering sludge in the gallbladder with mild gallbladder wall thickening and pericholecystic fluid, similar to prior examination although nonspecific in the setting of ascites. No discrete gallstones.   11/10/2021 Imaging   EXAM: CT ABDOMEN WITHOUT AND WITH CONTRAST  IMPRESSION: Post treatment changes about the LEFT hepatic lobe lesion with variable arterial enhancement not showing washout and with non masslike features, favor perfusional defect, characterized as TR equivocal but favor nonviable.   Lesion in the posterior RIGHT  hepatic lobe over time with enlargement and with serpiginous internal low signal on MR and low attenuation on CT. Findings categorized as LR M given signs of rim like enhancement on previous imaging and infiltrative appearance. Potentially infiltrative HCC but without specific imaging features of hepatocellular carcinoma by LI-RADS.   Enlarging lesion in the inferior RIGHT hepatic lobe greater than 50% diameter increase over 6 month interval, LI-RADS category 5 showing non rim arterial phase enhancement.   Additional foci of arterial phase enhancement without definitive washout, categorized as LI-RADS category 3 but in aggregate are more suspicious for small foci of hepatocellular carcinoma, attention on follow-up.   11/19/2021 Pathology Results   A. PLEURAL FLUID, LEFT, THORACENTESIS:   FINAL MICROSCOPIC DIAGNOSIS:  - No malignant cells identified  - Reactive mesothelial cells present    12/15/2021 PET scan   IMPRESSION: 1. Intensely hypermetabolic mass in the posterior RIGHT hepatic lobe consistent with known hepatocellular carcinoma. Smaller lesions identified on MRI are not hypermetabolic. 2. No evidence of metastatic lymphadenopathy or distant metastatic disease. 3. Elongated nodular thickening in the lingula is favored benign. Moderate LEFT pleural effusion.   06/29/2022 - 07/19/2022 Chemotherapy   Patient is on Treatment Plan : LUNG Atezolizumab + Bevacizumab q21d Maintenance     07/09/2022 -  Chemotherapy   Patient is on Treatment Plan : LUNG Atezolizumab + Bevacizumab Maintenance q21d     Malignant neoplasm of central portion of right breast in female, estrogen receptor positive ([place])  08/29/2019 Cancer Staging   Staging form: Breast, AJCC 8th Edition - Clinical stage from 08/29/2019: Stage IB (cT2, cN0, cM0, G2, ER+, PR+, HER2-) - Signed by Truitt Merle, MD on 09/05/2019   08/29/2019 Mammogram   Diagnostic mammogram  08/29/19 IMPRESSION: Suspicious mass in the 12 o'clock region of the  right breast 3 cm from the nipple measuring 3.2 x 1.6 x 2.6 cm   08/29/2019 Initial Biopsy   Diagnosis 08/29/19 Breast, right, needle core biopsy, 12 o'clock - INVASIVE DUCTAL CARCINOMA, SEE COMMENT.   08/29/2019 Receptors her2   Results: IMMUNOHISTOCHEMICAL AND MORPHOMETRIC ANALYSIS PERFORMED MANUALLY The tumor cells are NEGATIVE for Her2 (1+). Estrogen Receptor: 100%, POSITIVE, STRONG STAINING INTENSITY Progesterone Receptor: 90%, POSITIVE, STRONG STAINING INTENSITY Proliferation Marker Ki67: 15%   09/05/2019 Initial Diagnosis   Malignant neoplasm of central portion of right breast in female, estrogen receptor positive ([place])   09/13/2019 Breast MRI   MRI breast 09/13/19 IMPRESSION: 1. Irregular spiculated enhancing mass in the right breast representing the patient's known malignancy. No abnormal right axillary lymph nodes. 2. Prominent left internal mammary lymph node is indeterminate. No abnormal appearing right internal mammary lymph nodes. 3. No evidence of malignancy in the left breast.   10/16/2019 Cancer Staging   Staging form: Breast, AJCC 8th Edition - Pathologic stage from 10/16/2019: Stage IA (pT2, pN0, cM0, G2, ER+, PR+, HER2-, Oncotype DX score: 21) - Signed by Gardenia Phlegm, NP on 11/07/2019   10/16/2019 Oncotype testing   Oncotype 10/16/19 Recurrence score 21 with 7% benefit of Tamoxifen or AI. There is less than 1 % benefit of chemo.    10/16/2019 Surgery   RIGHT BREAST LUMPECTOMY WITH SENTINEL LYMPH NODE BIOPSY by Dr Barry Dienes 10/16/19    10/16/2019 Pathology Results   FINAL MICROSCOPIC DIAGNOSIS: 10/16/19  A. BREAST, RIGHT, LUMPECTOMY:  - Invasive ductal carcinoma, 2.3 cm, Nottingham grade 2 of 3.  - Margins of resection are not involved (Closest margin: 1 mm,  anterior).  - See oncology table.   B. SENTINEL LYMPH NODE, RIGHT AXILLARY #1, BIOPSY:  - One lymph node, negative for carcinoma (0/1).   C. SENTINEL LYMPH NODE, RIGHT AXILLARY #2,  BIOPSY:  - One lymph node, negative for carcinoma (0/1).    09/2019 -  Anti-estrogen oral therapy   Anastrozole 78m daily started in Nov 2020. Due to worsened knee pain/arthritis, she was switched to Tamoxifen in 03/2020.    12/03/2019 - 12/31/2019 Radiation Therapy   Adjuvant RT with Dr SIsidore Moos1/4/21-12/31/19   04/01/2020 Survivorship   SCP delivered by virtual visit per LCira Rue NP       INTERVAL HISTORY:  TASHLYND MICHNAis here for IVF and review PET scan findings.  She is still quite fatigued, she was constipated and had multiple episodes of nausea yesterday, improved today after BM from laxities. NO other new complains.   All other systems were reviewed with the patient and are negative.  MEDICAL HISTORY:  Past Medical History:  Diagnosis Date   Anemia    Breast cancer ([place] 08/30/2019   Cancer ([facility]    liver- just diagnosed, not started treatment yet   Cervicalgia    COPD (chronic obstructive pulmonary disease) (HCC)    Esophageal varices (H[facility])    GERD (gastroesophageal reflux disease)    Headache    Hepatic cirrhosis (H[facility])    Hepatitis C    resolved 2018   Hepatocellular carcinoma (H[facility])    IBS (irritable bowel syndrome)    Lactose intolerance    Personal history of radiation therapy 11/2019   Right breast   Portal hypertension (HCC)    Raynaud's syndrome    Thrombocytopenia ([place]     SURGICAL HISTORY: Past Surgical History:  Procedure Laterality Date  BREAST BIOPSY Right 09/2019   BREAST LUMPECTOMY Right 09/2019   BREAST LUMPECTOMY WITH AXILLARY LYMPH NODE BIOPSY Right 10/16/2019   Procedure: RIGHT BREAST LUMPECTOMY WITH SENTINEL LYMPH NODE BIOPSY;  Surgeon: Stark Klein, MD;  Location: [facility];  Service: General;  Laterality: Right;   COLONOSCOPY     ESOPHAGEAL BANDING  10/27/2021   Procedure: ESOPHAGEAL BANDING;  Surgeon: Irene Shipper, MD;  Location: [facility];  Service: Endoscopy;;   ESOPHAGOGASTRODUODENOSCOPY (EGD) WITH PROPOFOL N/A 10/27/2021    Procedure: ESOPHAGOGASTRODUODENOSCOPY (EGD) WITH PROPOFOL;  Surgeon: Irene Shipper, MD;  [facility];  Service: Endoscopy;  Laterality: N/A;   ESOPHAGOGASTRODUODENOSCOPY (EGD) WITH PROPOFOL N/A 01/04/2022   Procedure: ESOPHAGOGASTRODUODENOSCOPY (EGD) WITH PROPOFOL;  Surgeon: Irene Shipper, MD;  [facility];  Service: Endoscopy;  Laterality: N/A;   HAMMER TOE SURGERY Left 2012   pins placed in great toe, 2nd,3rd, 4th toes, all pins removed except great toe   IR ANGIOGRAM SELECTIVE EACH ADDITIONAL VESSEL  12/16/2021   IR ANGIOGRAM SELECTIVE EACH ADDITIONAL VESSEL  12/16/2021   IR ANGIOGRAM SELECTIVE EACH ADDITIONAL VESSEL  12/22/2021   IR ANGIOGRAM VISCERAL SELECTIVE  12/16/2021   IR ANGIOGRAM VISCERAL SELECTIVE  12/22/2021   IR EMBO TUMOR ORGAN ISCHEMIA INFARCT INC GUIDE ROADMAPPING  12/22/2021   IR IMAGING GUIDED PORT INSERTION  07/07/2022   IR PARACENTESIS  11/13/2019   IR PARACENTESIS  03/04/2022   IR PATIENT EVAL TECH 0-60 MINS  05/19/2022   IR PERC PLEURAL DRAIN W/INDWELL CATH W/IMG GUIDE  03/09/2022   IR RADIOLOGIST EVAL & MGMT  08/16/2019   IR RADIOLOGIST EVAL & MGMT  02/06/2020   IR RADIOLOGIST EVAL & MGMT  06/26/2020   IR RADIOLOGIST EVAL & MGMT  12/31/2020   IR RADIOLOGIST EVAL & MGMT  07/21/2021   IR RADIOLOGIST EVAL & MGMT  10/08/2021   IR RADIOLOGIST EVAL & MGMT  01/13/2022   IR RADIOLOGIST EVAL & MGMT  03/05/2022   IR THORACENTESIS ASP PLEURAL SPACE W/IMG GUIDE  10/12/2021   IR THORACENTESIS ASP PLEURAL SPACE W/IMG GUIDE  10/26/2021   IR THORACENTESIS ASP PLEURAL SPACE W/IMG GUIDE  11/19/2021   IR THORACENTESIS ASP PLEURAL SPACE W/IMG GUIDE  12/22/2021   IR US GUIDE VASC ACCESS RIGHT  12/16/2021   IR US GUIDE VASC ACCESS RIGHT  12/22/2021   PELVIC LAPAROSCOPY Bilateral 1992   RADIOLOGY WITH ANESTHESIA N/A 09/16/2021   Procedure: CT MICROWAVE ABLATION;  Surgeon: Aletta Edouard, MD;  Location: [facility];  Service: Radiology;  Laterality: N/A;   UPPER GASTROINTESTINAL ENDOSCOPY   2002   had esophageal dilitation    I have reviewed the social history and family history with the patient and they are unchanged from previous note.  ALLERGIES:  is allergic to hydromorphone hcl and aspirin.  MEDICATIONS:  Current Outpatient Medications  Medication Sig Dispense Refill   anastrozole (ARIMIDEX) 1 MG tablet Take 1 tablet (1 mg total) by mouth daily. 30 tablet 2   palbociclib (IBRANCE) 125 MG tablet Take 1 tablet (125 mg total) by mouth daily. Take for 21 days on, 7 days off, repeat every 28 days. 21 tablet 0   baclofen (LIORESAL) 10 MG tablet Take 5 mg by mouth daily as needed for muscle spasms.     dicyclomine (BENTYL) 20 MG tablet Take 0.5 tablets (10 mg total) by mouth every 6 (six) hours as needed for spasms. Take one tablet every 6-8 hours as needed for abdominal cramping (Patient taking differently: Take 20 mg  by mouth daily as needed (abdominal cramping).) 90 tablet 0   lidocaine-prilocaine (EMLA) cream Apply 1 Application topically as needed. 30 g 2   nicotine (NICODERM CQ - DOSED IN MG/24 HOURS) 21 mg/24hr patch Place 21 mg onto the skin daily as needed (smoking cessation on work days).     oxyCODONE (OXY IR/ROXICODONE) 5 MG immediate release tablet Take 1 tablet (5 mg total) by mouth every 6 (six) hours as needed for severe pain. 60 tablet 0   pantoprazole (PROTONIX) 40 MG tablet TAKE 1 TABLET BY MOUTH EVERY DAY 90 tablet 1   Prenatal Vit-Fe Fumarate-FA (PRENATAL PO) Take 1 tablet by mouth daily.     promethazine (PHENERGAN) 12.5 MG tablet Take 1 tablet (12.5 mg total) by mouth daily as needed for nausea or vomiting. 20 tablet 3   No current facility-administered medications for this visit.    PHYSICAL EXAMINATION: ECOG PERFORMANCE STATUS: 2 - Symptomatic, <50% confined to bed  There were no vitals filed for this visit.  Wt Readings from Last 3 Encounters:  08/09/22 83 lb 12.8 oz (38 kg)  07/19/22 84 lb 8 oz (38.3 kg)  06/29/22 87 lb 11.2 oz (39.8 kg)      GENERAL:alert, no distress and comfortable SKIN: skin color, texture, turgor are normal, no rashes or significant lesions EYES: normal, Conjunctiva are pink and non-injected, sclera clear  LYMPH:  no palpable lymphadenopathy in the cervical, axillary  LUNGS: clear to auscultation and percussion with normal breathing effort, (+) fluid on left HEART: regular rate & rhythm and no murmurs and no lower extremity edema ABDOMEN: soft, non-tender and normal bowel sounds Musculoskeletal:no cyanosis of digits and no clubbing  NEURO: alert & oriented x 3 with fluent speech, no focal motor/sensory deficits  LABORATORY DATA:  I have reviewed the data as listed    Latest Ref Rng & Units 08/09/2022    9:53 AM 07/19/2022   10:01 AM 07/09/2022   10:55 AM  CBC  WBC 4.0 - 10.5 K/uL 10.3  7.1  7.1   Hemoglobin 12.0 - 15.0 g/dL 10.7  10.5  10.3   Hematocrit 36.0 - 46.0 % 32.7  32.5  31.2   Platelets 150 - 400 K/uL 98  153  85         Latest Ref Rng & Units 08/09/2022    9:53 AM 07/19/2022   10:01 AM 07/09/2022   10:55 AM  CMP  Glucose 70 - 99 mg/dL 150  131  106   BUN 6 - 20 mg/dL 40  24  19   Creatinine 0.44 - 1.00 mg/dL 1.01  0.71  0.73   Sodium 135 - 145 mmol/L 122  126  129   Potassium 3.5 - 5.1 mmol/L 5.7  4.8  3.7   Chloride 98 - 111 mmol/L 99  102  99   CO2 22 - 32 mmol/L _0 Calcium 8.9 - 10.3 mg/dL 8.1  8.1  7.7   Total Protein 6.5 - 8.1 g/dL 5.7  5.6  5.6   Total Bilirubin 0.3 - 1.2 mg/dL 1.4  1.3  1.8   Alkaline Phos 38 - 126 U/L 77  79  74   AST 15 - 41 U/L 37  40  35   ALT 0 - 44 U/L _1 RADIOGRAPHIC STUDIES: I have personally reviewed the radiological images as listed and agreed with the findings in the report. No  results found.    No orders of the defined types were placed in this encounter.  All questions were answered. The patient knows to call the clinic with any problems, questions or concerns. No barriers to learning was detected. The total  time spent in the appointment was 30 minutes.     Truitt Merle, MD 08/16/2022

## 2022-08-16 NOTE — Patient Instructions (Signed)
Rehydration, Adult Rehydration is the replacement of body fluids, salts, and minerals (electrolytes) that are lost during dehydration. Dehydration is when there is not enough water or other fluids in the body. This happens when you lose more fluids than you take in. Common causes of dehydration include: Not drinking enough fluids. This can occur when you are ill or doing activities that require a lot of energy, especially in hot weather. Conditions that cause loss of water or other fluids, such as diarrhea, vomiting, sweating, or urinating a lot. Other illnesses, such as fever or infection. Certain medicines, such as those that remove excess fluid from the body (diuretics). Symptoms of mild or moderate dehydration may include thirst, dry lips and mouth, and dizziness. Symptoms of severe dehydration may include increased heart rate, confusion, fainting, and not urinating. For severe dehydration, you may need to get fluids through an IV at the hospital. For mild or moderate dehydration, you can usually rehydrate at home by drinking certain fluids as told by your health care provider. What are the risks? Generally, rehydration is safe. However, taking in too much fluid (overhydration) can be a problem. This is rare. Overhydration can cause an electrolyte imbalance, kidney failure, or a decrease in salt (sodium) levels in the body. Supplies needed You will need an oral rehydration solution (ORS) if your health care provider tells you to use one. This is a drink to treat dehydration. It can be found in pharmacies and retail stores. How to rehydrate Fluids Follow instructions from your health care provider for rehydration. The kind of fluid and the amount you should drink depend on your condition. In general, you should choose drinks that you prefer. If told by your health care provider, drink an ORS. Make an ORS by following instructions on the package. Start by drinking small amounts, about  cup (120  mL) every 5-10 minutes. Slowly increase how much you drink until you have taken the amount recommended by your health care provider. Drink enough clear fluids to keep your urine pale yellow. If you were told to drink an ORS, finish it first, then start slowly drinking other clear fluids. Drink fluids such as: Water. This includes sparkling water and flavored water. Drinking only water can lead to having too little sodium in your body (hyponatremia). Follow the advice of your health care provider. Water from ice chips you suck on. Fruit juice with water you add to it (diluted). Sports drinks. Hot or cold herbal teas. Broth-based soups. Milk or milk products. Food Follow instructions from your health care provider about what to eat while you rehydrate. Your health care provider may recommend that you slowly begin eating regular foods in small amounts. Eat foods that contain a healthy balance of electrolytes, such as bananas, oranges, potatoes, tomatoes, and spinach. Avoid foods that are greasy or contain a lot of sugar. In some cases, you may get nutrition through a feeding tube that is passed through your nose and into your stomach (nasogastric tube, or NG tube). This may be done if you have uncontrolled vomiting or diarrhea. Beverages to avoid  Certain beverages may make dehydration worse. While you rehydrate, avoid drinking alcohol. How to tell if you are recovering from dehydration You may be recovering from dehydration if: You are urinating more often than before you started rehydrating. Your urine is pale yellow. Your energy level improves. You vomit less frequently. You have diarrhea less frequently. Your appetite improves or returns to normal. You feel less dizzy or less light-headed.   Your skin tone and color start to look more normal. Follow these instructions at home: Take over-the-counter and prescription medicines only as told by your health care provider. Do not take sodium  tablets. Doing this can lead to having too much sodium in your body (hypernatremia). Contact a health care provider if: You continue to have symptoms of mild or moderate dehydration, such as: Thirst. Dry lips. Slightly dry mouth. Dizziness. Dark urine or less urine than normal. Muscle cramps. You continue to vomit or have diarrhea. Get help right away if you: Have symptoms of dehydration that get worse. Have a fever. Have a severe headache. Have been vomiting and the following happens: Your vomiting gets worse or does not go away. Your vomit includes blood or green matter (bile). You cannot eat or drink without vomiting. Have problems with urination or bowel movements, such as: Diarrhea that gets worse or does not go away. Blood in your stool (feces). This may cause stool to look black and tarry. Not urinating, or urinating only a small amount of very dark urine, within 6-8 hours. Have trouble breathing. Have symptoms that get worse with treatment. These symptoms may represent a serious problem that is an emergency. Do not wait to see if the symptoms will go away. Get medical help right away. Call your local emergency services (911 in the [place] Do not drive yourself to the hospital. Summary Rehydration is the replacement of body fluids and minerals (electrolytes) that are lost during dehydration. Follow instructions from your health care provider for rehydration. The kind of fluid and amount you should drink depend on your condition. Slowly increase how much you drink until you have taken the amount recommended by your health care provider. Contact your health care provider if you continue to show signs of mild or moderate dehydration. This information is not intended to replace advice given to you by your health care provider. Make sure you discuss any questions you have with your health care provider. Document Revised: 01/16/2020 Document Reviewed: 11/26/2019 Elsevier Patient  Education  2023 [employer]  

## 2022-08-17 ENCOUNTER — Telehealth: Payer: Self-pay

## 2022-08-17 ENCOUNTER — Telehealth: Payer: Self-pay | Admitting: Pharmacist

## 2022-08-17 ENCOUNTER — Other Ambulatory Visit (HOSPITAL_COMMUNITY): Payer: Self-pay

## 2022-08-17 DIAGNOSIS — C50111 Malignant neoplasm of central portion of right female breast: Secondary | ICD-10-CM

## 2022-08-17 MED ORDER — PALBOCICLIB 125 MG PO TABS: 125.0000 mg | ORAL_TABLET | Freq: Every day | ORAL | 0 refills | Status: DC

## 2022-08-17 NOTE — Telephone Encounter (Signed)
Oral Oncology Patient Advocate Encounter  Prior Authorization for Leslee Home has been approved.    PA# [id number]  Effective dates: 07.21.23 through 09.18.24  Must fill through [facility].    Berdine Addison, [place] [facility] Patient [place]  [phone] (phone) [phone] (fax)

## 2022-08-17 NOTE — Telephone Encounter (Signed)
Oral Oncology Patient Advocate Encounter   Received notification that prior authorization for Leslee [facility] is required.   PA submitted on 09.19.2023  Key [id number]  Status is pending     Berdine Addison, [place] Patient [place]  [phone] (phone) [phone] (fax)

## 2022-08-17 NOTE — Telephone Encounter (Signed)
Oral Oncology Pharmacist Encounter  Patient's insurance requires that [facility]lee [facility] be filled through [facility]. Prescription redirected to [facility] for dispensing.  Leron Croak, PharmD, BCPS, [facility] Hematology/Oncology Clinical Pharmacist [facility] and [place] [phone] 08/17/2022 10:27 AM

## 2022-08-17 NOTE — Telephone Encounter (Signed)
Oral Oncology Pharmacist Encounter  Received new prescription for Ibrance (palbociclib) for the treatment of metastatic ER/PR +, HER-2 negative breast cancer in conjunction with anastrozole, planned duration until disease progression or unacceptable drug toxicity.  CBC w/ Diff and CMP from 08/09/22 assessed, noted pt with pltc of 98 K/uL, T. Bili 1.4 mg/dL, but AST/ALT WNL. Scr 1.01 mg/dL (CrCl ~37.7 mL/min) - no baseline renal or hepatic dose adjustments required. Prescription dose and frequency assessed for appropriateness. Appropriate for therapy initiation.   Current medication list in Epic reviewed, no relevant/significant DDIs with Ibrance identified. OK for patient to be on PPI and Ibrance given that patient will be on tablet formulation of Ibrance and not capsule.   Evaluated chart and no patient barriers to medication adherence noted.   Patient agreement for treatment documented in MD note on 08/16/22.  Prescription has been e-scribed to the [facility] for benefits analysis and approval.  Oral Oncology Clinic will continue to follow for insurance authorization, copayment issues, initial counseling and start date.  Leron Croak, PharmD, BCPS, [facility] Hematology/Oncology Clinical Pharmacist [facility] and [place] [phone] 08/17/2022 7:48 AM

## 2022-08-18 ENCOUNTER — Other Ambulatory Visit: Payer: Self-pay

## 2022-08-18 DIAGNOSIS — K7460 Unspecified cirrhosis of liver: Secondary | ICD-10-CM | POA: Diagnosis not present

## 2022-08-18 DIAGNOSIS — C220 Liver cell carcinoma: Secondary | ICD-10-CM | POA: Diagnosis not present

## 2022-08-18 NOTE — Telephone Encounter (Signed)
Oral Chemotherapy Pharmacist Encounter   Contacted [facility] regarding new prescription for Ibrance for Theresa Rocha. Notified by representative that they are still in the processing portion of the prescription at this time. Request made to representative [place] to mark medication as expedited/urgent.  Oral chemotherapy clinic will continue to follow.   Leron Croak, PharmD, BCPS, BCOP Hematology/Oncology Clinical Pharmacist [facility] [place] [phone] 08/18/2022 11:09 AM

## 2022-08-20 ENCOUNTER — Other Ambulatory Visit (HOSPITAL_COMMUNITY): Payer: Self-pay

## 2022-08-20 NOTE — Telephone Encounter (Signed)
Oral Chemotherapy Pharmacist Encounter   Called to check on status of Ibrance with [facility]. Per representative patient has $16.66 copay for first fill. They tried calling patient while I was on the phone, but were unable to reach patient, they have left a voicemail for patient to call back to set up shipment.   Oral chemotherapy clinic will continue to follow.   Leron Croak, PharmD, BCPS, [facility] Hematology/Oncology Clinical Pharmacist [facility] [place] [phone] 08/20/2022 9:42 AM

## 2022-08-23 ENCOUNTER — Other Ambulatory Visit (HOSPITAL_COMMUNITY): Payer: Self-pay

## 2022-08-23 DIAGNOSIS — R180 Malignant ascites: Secondary | ICD-10-CM | POA: Diagnosis not present

## 2022-08-23 DIAGNOSIS — C50919 Malignant neoplasm of unspecified site of unspecified female breast: Secondary | ICD-10-CM | POA: Diagnosis not present

## 2022-08-23 DIAGNOSIS — C220 Liver cell carcinoma: Secondary | ICD-10-CM | POA: Diagnosis not present

## 2022-08-23 NOTE — Telephone Encounter (Signed)
Oral Chemotherapy Pharmacist Encounter   Attempted to reach patient to provide update and offer for initial counseling on oral medication: Ibrance (palbociclib).   No answer. Left voicemail for patient to call back to discuss details of medication acquisition and initial counseling session.  Theresa Rocha is ready to be set up for shipment through [facility], they have reached out to on 9/22 and 9/25 to patient to set up shipment, but have not been able to reach patient.   Leron Croak, PharmD, BCPS, [facility] Hematology/Oncology Clinical Pharmacist [facility] [place] [phone] 08/23/2022 10:58 AM

## 2022-08-23 NOTE — Telephone Encounter (Addendum)
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  Counseled patient on administration, dosing, side effects, monitoring, drug-food interactions, safe handling, storage, and disposal.  Patient will take Ibrance $RemoveBefo'125mg'bdqvZflcWuO$  tablets, 1 tablet by mouth once daily, with or without food, taken for 3 weeks on, 1 week off, and repeated.  Patient knows to avoid grapefruit and grapefruit juice while on treatment with Ibrance.  Ibrance start date: patient to call [facility] to set up shipment (phone number provided tele: [phone]). Once patient receives medication she will start first cycle  Adverse effects include but are not limited to: fatigue, GI upset, nausea, decreased blood counts. Severe, life-threatening, and/or fatal interstitial lung disease (ILD) and/or pneumonitis may occur with CDK 4/6 inhibitors. Patient will obtain anti diarrheal and alert the office of 4 or more loose stools above baseline.  Reviewed with patient importance of keeping a medication schedule and plan for any missed doses. No barriers to medication adherence identified.  Medication reconciliation performed and medication/allergy list updated.  All questions answered.  Theresa Rocha voiced understanding and appreciation.   Medication education handout placed in mail for patient. Patient knows to call the office with questions or concerns. Oral Chemotherapy Clinic phone number provided to patient.   Leron Croak, PharmD, BCPS, [facility] Hematology/Oncology Clinical Pharmacist [facility] [place] [phone] 08/23/2022 12:17 PM

## 2022-08-25 ENCOUNTER — Other Ambulatory Visit: Payer: Self-pay | Admitting: Hematology

## 2022-08-25 ENCOUNTER — Telehealth: Payer: Self-pay

## 2022-08-25 MED ORDER — GUAIFENESIN-CODEINE 100-10 MG/5ML PO SOLN: 5.0000 mL | Freq: Four times a day (QID) | ORAL | 0 refills | Status: AC | PRN

## 2022-08-25 NOTE — Telephone Encounter (Signed)
This nurse received a phone message from this patient stating that she has a dry cough that is becoming more constant and now her throat is sore and it is becoming difficult for her to talk.  Patient would like to know if the provider has any recommendations on what she should do about the cough.  Patient request forwarded to provider.

## 2022-08-27 ENCOUNTER — Other Ambulatory Visit (HOSPITAL_COMMUNITY): Payer: Self-pay | Admitting: Interventional Radiology

## 2022-08-27 DIAGNOSIS — J90 Pleural effusion, not elsewhere classified: Secondary | ICD-10-CM

## 2022-08-28 ENCOUNTER — Other Ambulatory Visit: Payer: Self-pay

## 2022-08-30 ENCOUNTER — Other Ambulatory Visit: Payer: Self-pay

## 2022-08-30 ENCOUNTER — Inpatient Hospital Stay: Payer: BC Managed Care – PPO | Attending: Hematology

## 2022-08-30 ENCOUNTER — Inpatient Hospital Stay: Payer: BC Managed Care – PPO

## 2022-08-30 ENCOUNTER — Inpatient Hospital Stay (HOSPITAL_BASED_OUTPATIENT_CLINIC_OR_DEPARTMENT_OTHER): Payer: BC Managed Care – PPO | Admitting: Hematology

## 2022-08-30 ENCOUNTER — Other Ambulatory Visit: Payer: Self-pay | Admitting: Hematology

## 2022-08-30 ENCOUNTER — Other Ambulatory Visit: Payer: Self-pay | Admitting: Radiology

## 2022-08-30 VITALS — BP 105/71 | HR 92 | Temp 97.7°F | Resp 16 | Ht 63.0 in | Wt 87.2 lb

## 2022-08-30 DIAGNOSIS — J90 Pleural effusion, not elsewhere classified: Secondary | ICD-10-CM | POA: Diagnosis not present

## 2022-08-30 DIAGNOSIS — I959 Hypotension, unspecified: Secondary | ICD-10-CM | POA: Insufficient documentation

## 2022-08-30 DIAGNOSIS — K219 Gastro-esophageal reflux disease without esophagitis: Secondary | ICD-10-CM | POA: Diagnosis not present

## 2022-08-30 DIAGNOSIS — Z515 Encounter for palliative care: Secondary | ICD-10-CM | POA: Diagnosis not present

## 2022-08-30 DIAGNOSIS — Z923 Personal history of irradiation: Secondary | ICD-10-CM | POA: Diagnosis not present

## 2022-08-30 DIAGNOSIS — Z79811 Long term (current) use of aromatase inhibitors: Secondary | ICD-10-CM | POA: Insufficient documentation

## 2022-08-30 DIAGNOSIS — C782 Secondary malignant neoplasm of pleura: Secondary | ICD-10-CM | POA: Insufficient documentation

## 2022-08-30 DIAGNOSIS — Z170 Estrogen receptor positive status [ER+]: Secondary | ICD-10-CM

## 2022-08-30 DIAGNOSIS — E871 Hypo-osmolality and hyponatremia: Secondary | ICD-10-CM | POA: Insufficient documentation

## 2022-08-30 DIAGNOSIS — D61818 Other pancytopenia: Secondary | ICD-10-CM | POA: Diagnosis not present

## 2022-08-30 DIAGNOSIS — R5383 Other fatigue: Secondary | ICD-10-CM | POA: Insufficient documentation

## 2022-08-30 DIAGNOSIS — F419 Anxiety disorder, unspecified: Secondary | ICD-10-CM | POA: Diagnosis not present

## 2022-08-30 DIAGNOSIS — K7460 Unspecified cirrhosis of liver: Secondary | ICD-10-CM | POA: Diagnosis not present

## 2022-08-30 DIAGNOSIS — C50111 Malignant neoplasm of central portion of right female breast: Secondary | ICD-10-CM | POA: Diagnosis not present

## 2022-08-30 DIAGNOSIS — Z79899 Other long term (current) drug therapy: Secondary | ICD-10-CM | POA: Insufficient documentation

## 2022-08-30 DIAGNOSIS — R161 Splenomegaly, not elsewhere classified: Secondary | ICD-10-CM | POA: Diagnosis not present

## 2022-08-30 DIAGNOSIS — C220 Liver cell carcinoma: Secondary | ICD-10-CM

## 2022-08-30 DIAGNOSIS — Z5112 Encounter for antineoplastic immunotherapy: Secondary | ICD-10-CM | POA: Diagnosis not present

## 2022-08-30 LAB — CBC WITH DIFFERENTIAL (CANCER CENTER ONLY)
Abs Immature Granulocytes: 0.04 10*3/uL (ref 0.00–0.07)
Basophils Absolute: 0.1 10*3/uL (ref 0.0–0.1)
Basophils Relative: 1 %
Eosinophils Absolute: 0.3 10*3/uL (ref 0.0–0.5)
Eosinophils Relative: 5 %
HCT: 30.8 % — ABNORMAL LOW (ref 36.0–46.0)
Hemoglobin: 9.7 g/dL — ABNORMAL LOW (ref 12.0–15.0)
Immature Granulocytes: 1 %
Lymphocytes Relative: 7 %
Lymphs Abs: 0.4 10*3/uL — ABNORMAL LOW (ref 0.7–4.0)
MCH: 24.3 pg — ABNORMAL LOW (ref 26.0–34.0)
MCHC: 31.5 g/dL (ref 30.0–36.0)
MCV: 77 fL — ABNORMAL LOW (ref 80.0–100.0)
Monocytes Absolute: 0.2 10*3/uL (ref 0.1–1.0)
Monocytes Relative: 4 %
Neutro Abs: 4.7 10*3/uL (ref 1.7–7.7)
Neutrophils Relative %: 82 %
Platelet Count: 92 10*3/uL — ABNORMAL LOW (ref 150–400)
RBC: 4 MIL/uL (ref 3.87–5.11)
RDW: 24.3 % — ABNORMAL HIGH (ref 11.5–15.5)
WBC Count: 5.7 10*3/uL (ref 4.0–10.5)
nRBC: 0 % (ref 0.0–0.2)

## 2022-08-30 LAB — CMP (CANCER CENTER ONLY)
ALT: 19 U/L (ref 0–44)
AST: 31 U/L (ref 15–41)
Albumin: 2.4 g/dL — ABNORMAL LOW (ref 3.5–5.0)
Alkaline Phosphatase: 87 U/L (ref 38–126)
Anion gap: 3 — ABNORMAL LOW (ref 5–15)
BUN: 27 mg/dL — ABNORMAL HIGH (ref 6–20)
CO2: 21 mmol/L — ABNORMAL LOW (ref 22–32)
Calcium: 7.7 mg/dL — ABNORMAL LOW (ref 8.9–10.3)
Chloride: 103 mmol/L (ref 98–111)
Creatinine: 0.91 mg/dL (ref 0.44–1.00)
GFR, Estimated: >60 mL/min (ref >60)
Glucose, Bld: 129 mg/dL — ABNORMAL HIGH (ref 70–99)
Potassium: 5.2 mmol/L — ABNORMAL HIGH (ref 3.5–5.1)
Sodium: 127 mmol/L — ABNORMAL LOW (ref 135–145)
Total Bilirubin: 1.5 mg/dL — ABNORMAL HIGH (ref 0.3–1.2)
Total Protein: 5.1 g/dL — ABNORMAL LOW (ref 6.5–8.1)

## 2022-08-30 MED ORDER — SODIUM CHLORIDE 0.9% FLUSH
10.0000 mL | INTRAVENOUS | Status: DC | PRN
  Administered 2022-08-30: 10 mL

## 2022-08-30 MED ORDER — HEPARIN SOD (PORK) LOCK FLUSH 100 UNIT/ML IV SOLN
500.0000 [IU] | Freq: Once | INTRAVENOUS | Status: AC | PRN
  Administered 2022-08-30: 500 [IU]

## 2022-08-30 MED ORDER — SODIUM CHLORIDE 0.9 % IV SOLN: Freq: Once | INTRAVENOUS | Status: AC

## 2022-08-30 MED ORDER — SODIUM CHLORIDE 0.9% FLUSH
10.0000 mL | Freq: Once | INTRAVENOUS | Status: AC
  Administered 2022-08-30: 10 mL

## 2022-08-30 MED ORDER — SODIUM CHLORIDE 0.9 % IV SOLN
15.0000 mg/kg | Freq: Once | INTRAVENOUS | Status: AC
  Administered 2022-08-30: 600 mg via INTRAVENOUS
  Filled 2022-08-30: qty 16

## 2022-08-30 MED ORDER — SODIUM CHLORIDE 0.9 % IV SOLN
1200.0000 mg | Freq: Once | INTRAVENOUS | Status: AC
  Administered 2022-08-30: 1200 mg via INTRAVENOUS
  Filled 2022-08-30: qty 20

## 2022-08-30 NOTE — Progress Notes (Signed)
Per Dr Burr Medico, ok to treat today with platelet count 92. Also ok to treat using last urine protein result from September.

## 2022-08-30 NOTE — Progress Notes (Signed)
[place]   [phone] [phone]   Clinic Follow up Note   Patient Care Team: Seward Carol, MD as PCP - General (Internal Medicine) Arna Snipe, RN as Oncology Nurse Navigator Irene Shipper, MD as Consulting Physician (Gastroenterology) Truitt Merle, MD as Consulting Physician (Hematology) Alla Feeling, NP as Nurse Practitioner (Nurse Practitioner) Stark Klein, MD as Consulting Physician (General Surgery) Aletta Edouard, MD as Consulting Physician (Interventional Radiology) Roosevelt Locks, CRNP as Nurse Practitioner (Nurse Practitioner) Mauro Kaufmann, RN as Oncology Nurse Navigator Rockwell Germany, RN as Oncology Nurse Navigator Eppie Gibson, MD as Attending Physician (Radiation Oncology) Trula Slade, DPM as Consulting Physician (Podiatry)  Date of Service:  08/30/2022  CHIEF COMPLAINT: f/u of metastatic breast cancer, [place]  CURRENT THERAPY:  -Tecentriq/Bevacizumab, q21d, starting 06/29/22 -Anastrozole, starting 08/16/22 Leslee Home, days 1-21 q28d, starting 08/24/22  ASSESSMENT & PLAN:  Theresa Rocha is a 56 y.o. female with   1. Multifocal Hepatocellular carcinoma, cT2N0Mx, stage II -presented with abdominal pain and rectal bleeding in 06/2019. Work up scans showed multifocal lesions of liver consistent with [place] with indeterminate right lobe lesions. AFP was WNL. -not a candidate for transplant or surgery -s/p left lobe ablation on 09/16/21, and right lobe Y90 on 12/22/21 with Dr. Kathlene Cote. -Liver MRI from 06/06/22 showed cancer progression of on previously treated lesion in segment 7 with extra capsular extension and adenopathy, consistent with disease progression. Recommendation is for systemic treatment. -She began Tecentriq and bevacizumab on 06/29/22, she has been tolerating therapy well. Her overall clinical condition has slightly improved, but not much better  -labs reviewed, hgb 9.7, plt 92k. Will monitor, otherwise adequate to  proceed with treatment today.   2. 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 Due to knee joint pain, she was switched to Tamoxifen in 03/2020. She is tolerating with hot flashes.  -she developed left pleural effusion in 08/2021; PET scan 11/2021 and cytology has been negative (last cytology obtained 03/2022). She required PleurX catheter placement 03/09/22. -most recent mammogram from 05/13/22 showed breast calcification in right side. Biopsy on hold. -Tamoxifen held since 06/15/22 for work up of recurrent plural effusion  -[place] PET on 08/13/22 showed mild hypermetabolism to left pleural surface, suspicious for breast cancer metastasis  -she started anastrozole on 08/16/22 and Ibrance on 08/24/22. She is tolerating well thus far.   3. Hyponatremia, Hypotension  -Refractory, secondary to her liver cirrhosis and high output from thoracic Pleurx -she still has intermittent hyponatremia, will give IV hydration as needed  4. Fatigue -she reports continued fatigue, likely multifactorial. -I encouraged her to try melatonin first.     PLAN:  -proceed with tecentriq/beva today, with additional NS 562m  -continue anastrozole and Ibrance  -lab and 2h IVF for next two weeks  -Lab, follow-up and Tecentriq, bevacizumab every 3 weeks   No problem-specific Assessment & Plan notes found for this encounter.   SUMMARY OF ONCOLOGIC HISTORY: Oncology History Overview Note  Cancer Staging Hepatocellular carcinoma ([facility] Staging form: Liver, AJCC 8th Edition - Clinical stage from 08/02/2019: Stage II (cT2, cN0, cM0) - Signed by FTruitt Merle MD on 08/02/2019  Malignant neoplasm of central portion of right breast in female, estrogen receptor positive ([place] Staging form: Breast, AJCC 8th  Edition - Clinical stage  from 08/29/2019: Stage IB (cT2, cN0, cM0, G2, ER+, PR+, HER2-) - Signed by Truitt Merle, MD on 09/05/2019    Hepatocellular carcinoma ([place])  07/04/2019 Imaging   CT AP IMPRESSION: 1. Findings of cirrhosis and hepatomegaly.  Patent portal vein. 2. Numerous varicosities as described above. 3. Moderate to large amount of abdominopelvic ascites 4. Contrast filled appendix which appears to be a mildly dilated with apparent wall thickening which could be due to surrounding ascites.   07/09/2019 Imaging   ABD US IMPRESSION: 1. Three small hypoechoic lesions in the RIGHT hepatic lobe measuring less than 1 cm but not evident on comparison contrast enhanced CT from 07/04/2019. Recommend MRI of the abdomen without and with contrast to evaluate these hepatic lesions in cirrhotic patient. 2. Increased liver echogenicity and lobular contour consistent cirrhosis. 3. Moderate mild to moderate volume of ascites. 4. Mild gallbladder wall thickening likely related to ascites. 5. No cholelithiasis or cholecystitis evident   07/26/2019 Imaging   MR ABD IMPRESSION: 1. In the lateral segment left hepatic lobe there are two LI-RADS category LR-5 lesions representing hepatocellular carcinoma. 2. There is also a LI-RADS category LR-3 lesion peripherally in the right hepatic lobe, intermediate probability for hepatocellular carcinoma. 3. Hepatic cirrhosis and diffuse wall thickening of the gallbladder. Widespread steatosis in the liver. 4. 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 5.  Aortic Atherosclerosis (ICD10-I70.0). 6. Widespread ascites and mesenteric edema.   07/26/2019 Tumor Marker   AFP 5.5 (baseline)   08/02/2019 Initial Diagnosis   Hepatocellular carcinoma ([place])   08/02/2019 Cancer Staging   Staging form: Liver, AJCC 8th Edition - Clinical stage from 08/02/2019: Stage II (cT2, cN0, cM0) - Signed by Truitt Merle, MD on 08/02/2019   08/07/2019 Imaging   CT  Chest 08/07/19 IMPRESSION: 1. Tiny bilateral pulmonary nodules measuring up to 6 mm. These are too small to definitively characterize. Given the history of HCC, close follow-up recommended to ensure stability. 2.  Emphysema. (ICD10-J43.9)   08/09/2019 Procedure   Upper Endoscopy by Dr Henrene Pastor 08/09/19  IMPRESSION 1. Small esophageal varices 2. Mild diffuse portal hypertensive gastropathy 3. Otherwise normal EGD.   02/04/2020 Imaging   MRI abdomen  IMPRESSION: Stable exam. The small segment II lesions identified previously as LI-RADS category 5 are stable in size.   Previously characterized tiny LI-RADS 3 lesion in the inferior right liver is stable.   No new suspicious focal hepatic lesion on a background liver morphology consistent with cirrhosis.   Interval decrease in ascites with decrease and diffuse gallbladder wall thickening.     06/18/2020 Imaging   MRI Abdomen  IMPRESSION: 1. Stable 1.1 x 0.9 cm rounded lesion in the medial left lobe of the liver, hepatic segment II, demonstrating arterial phase hyperenhancement without washout or capsule. This has previously been described as a LI-RADS category 5 lesion based on prior observations, if categorized by current imaging features this is consistent with LI-RADS category 4. 2. Stable 1.5 x 1.2 cm lesion lateral and inferior to this lesion in hepatic segment II with very subtle evidence of washout but without capsular enhancement. LI-RADS category 5. 3. Stable 5 mm focus of early arterial phase hyperenhancement of the inferior right lobe of the liver, hepatic segment VI, without evidence of washout or capsule. This remains consistent with LI-RADS category 3. 4. No new suspicious lesions or contrast enhancement. 5. Cirrhotic morphology of the liver. 6. Trace perihepatic ascites.     10/30/2021 Imaging   EXAM: MRI ABDOMEN WITHOUT AND WITH CONTRAST  IMPRESSION: 1. Ablation defect of the left lobe of the liver which  subtends two lesions previously noted in hepatic segment II. No evidence of residual contrast enhancement to suggest viable tumor.  2. Significant interval increase in size of a hyperenhancing lesion of the posterior liver dome, hepatic segment VII, measuring 4.0 x 3.7 cm, previously 2.6 x 2.5 cm when measured similarly. This demonstrates some heterogeneous contrast enhancement although no clear evidence of washout or capsular enhancement. Findings remain LI-RADS category 5, consistent with hepatocellular carcinoma. 3. Slight interval increase in size of a hyperenhancing lesion of the inferior tip of the right lobe of the liver, hepatic segment VI, measuring 1.1 x 0.7 cm, previously no greater than 0.6 cm, as well as an additional adjacent lesion measuring 0.6 cm, previously 0.4 cm. Given threshold growth, these lesions are upstaged to LI-RADS category 4 and suspicious for additional small foci of hepatocellular carcinoma. 4. Cirrhotic morphology of the liver and hepatic steatosis. 5. Small volume ascites throughout the abdomen. 6. Large left, small right pleural effusions and associated atelectasis or consolidation. 7. Layering sludge in the gallbladder with mild gallbladder wall thickening and pericholecystic fluid, similar to prior examination although nonspecific in the setting of ascites. No discrete gallstones.   11/10/2021 Imaging   EXAM: CT ABDOMEN WITHOUT AND WITH CONTRAST  IMPRESSION: Post treatment changes about the LEFT hepatic lobe lesion with variable arterial enhancement not showing washout and with non masslike features, favor perfusional defect, characterized as TR equivocal but favor nonviable.   Lesion in the posterior RIGHT hepatic lobe over time with enlargement and with serpiginous internal low signal on MR and low attenuation on CT. Findings categorized as LR M given signs of rim like enhancement on previous imaging and infiltrative appearance. Potentially infiltrative HCC but  without specific imaging features of hepatocellular carcinoma by LI-RADS.   Enlarging lesion in the inferior RIGHT hepatic lobe greater than 50% diameter increase over 6 month interval, LI-RADS category 5 showing non rim arterial phase enhancement.   Additional foci of arterial phase enhancement without definitive washout, categorized as LI-RADS category 3 but in aggregate are more suspicious for small foci of hepatocellular carcinoma, attention on follow-up.   11/19/2021 Pathology Results   A. PLEURAL FLUID, LEFT, THORACENTESIS:   FINAL MICROSCOPIC DIAGNOSIS:  - No malignant cells identified  - Reactive mesothelial cells present    12/15/2021 PET scan   IMPRESSION: 1. Intensely hypermetabolic mass in the posterior RIGHT hepatic lobe consistent with known hepatocellular carcinoma. Smaller lesions identified on MRI are not hypermetabolic. 2. No evidence of metastatic lymphadenopathy or distant metastatic disease. 3. Elongated nodular thickening in the lingula is favored benign. Moderate LEFT pleural effusion.   06/29/2022 - 07/19/2022 Chemotherapy   Patient is on Treatment Plan : LUNG Atezolizumab + Bevacizumab q21d Maintenance     07/09/2022 -  Chemotherapy   Patient is on Treatment Plan : LUNG Atezolizumab + Bevacizumab Maintenance q21d     Malignant neoplasm of central portion of right breast in female, estrogen receptor positive ([place])  08/29/2019 Cancer Staging   Staging form: Breast, AJCC 8th Edition - Clinical stage from 08/29/2019: Stage IB (cT2, cN0, cM0, G2, ER+, PR+, HER2-) - Signed by Truitt Merle, MD on 09/05/2019   08/29/2019 Mammogram   Diagnostic mammogram 08/29/19 IMPRESSION: Suspicious mass in the 12 o'clock region of the right breast 3 cm from the nipple measuring 3.2 x 1.6 x 2.6 cm   08/29/2019 Initial Biopsy   Diagnosis 08/29/19 Breast, right, needle core biopsy,  12 o'clock - INVASIVE DUCTAL CARCINOMA, SEE COMMENT.   08/29/2019 Receptors her2    Results: IMMUNOHISTOCHEMICAL AND MORPHOMETRIC ANALYSIS PERFORMED MANUALLY The tumor cells are NEGATIVE for Her2 (1+). Estrogen Receptor: 100%, POSITIVE, STRONG STAINING INTENSITY Progesterone Receptor: 90%, POSITIVE, STRONG STAINING INTENSITY Proliferation Marker Ki67: 15%   09/05/2019 Initial Diagnosis   Malignant neoplasm of central portion of right breast in female, estrogen receptor positive ([place])   09/13/2019 Breast MRI   MRI breast 09/13/19 IMPRESSION: 1. Irregular spiculated enhancing mass in the right breast representing the patient's known malignancy. No abnormal right axillary lymph nodes. 2. Prominent left internal mammary lymph node is indeterminate. No abnormal appearing right internal mammary lymph nodes. 3. No evidence of malignancy in the left breast.   10/16/2019 Cancer Staging   Staging form: Breast, AJCC 8th Edition - Pathologic stage from 10/16/2019: Stage IA (pT2, pN0, cM0, G2, ER+, PR+, HER2-, Oncotype DX score: 21) - Signed by Gardenia Phlegm, NP on 11/07/2019   10/16/2019 Oncotype testing   Oncotype 10/16/19 Recurrence score 21 with 7% benefit of Tamoxifen or AI. There is less than 1 % benefit of chemo.    10/16/2019 Surgery   RIGHT BREAST LUMPECTOMY WITH SENTINEL LYMPH NODE BIOPSY by Dr Barry Dienes 10/16/19    10/16/2019 Pathology Results   FINAL MICROSCOPIC DIAGNOSIS: 10/16/19  A. BREAST, RIGHT, LUMPECTOMY:  - Invasive ductal carcinoma, 2.3 cm, Nottingham grade 2 of 3.  - Margins of resection are not involved (Closest margin: 1 mm,  anterior).  - See oncology table.   B. SENTINEL LYMPH NODE, RIGHT AXILLARY #1, BIOPSY:  - One lymph node, negative for carcinoma (0/1).   C. SENTINEL LYMPH NODE, RIGHT AXILLARY #2, BIOPSY:  - One lymph node, negative for carcinoma (0/1).    09/2019 -  Anti-estrogen oral therapy   Anastrozole 37m daily started in Nov 2020. Due to worsened knee pain/arthritis, she was switched to Tamoxifen in 03/2020.    12/03/2019 -  12/31/2019 Radiation Therapy   Adjuvant RT with Dr SIsidore Moos1/4/21-12/31/19   04/01/2020 Survivorship   SCP delivered by virtual visit per LCira Rue NP       INTERVAL HISTORY:  TJONNETTE NUONis here for a follow up of metastatic breast cancer and [facility] She was last seen by me on 08/16/22. She presents to the clinic alone. She reports she continues to stay tired.   All other systems were reviewed with the patient and are negative.  MEDICAL HISTORY:  Past Medical History:  Diagnosis Date   Anemia    Breast cancer ([facility] 08/30/2019   Cancer ([facility]    liver- just diagnosed, not started treatment yet   Cervicalgia    COPD (chronic obstructive pulmonary disease) (HCC)    Esophageal varices (HCC)    GERD (gastroesophageal reflux disease)    Headache    Hepatic cirrhosis (HCC)    Hepatitis C    resolved 2018   Hepatocellular carcinoma (HCC)    IBS (irritable bowel syndrome)    Lactose intolerance    Personal history of radiation therapy 11/2019   Right breast   Portal hypertension ([facility]    Raynaud's syndrome    Thrombocytopenia ([facility]     SURGICAL HISTORY: Past Surgical History:  Procedure Laterality Date   BREAST BIOPSY Right 09/2019   BREAST LUMPECTOMY Right 09/2019   BREAST LUMPECTOMY WITH AXILLARY LYMPH NODE BIOPSY Right 10/16/2019   Procedure: RIGHT BREAST LUMPECTOMY WITH SENTINEL LYMPH NODE BIOPSY;  Surgeon: BStark Klein MD;  [facility]  Service: General;  Laterality: Right;   COLONOSCOPY     ESOPHAGEAL BANDING  10/27/2021   Procedure: ESOPHAGEAL BANDING;  Surgeon: Irene Shipper, MD;  Location: [facility];  Service: Endoscopy;;   ESOPHAGOGASTRODUODENOSCOPY (EGD) WITH PROPOFOL N/A 10/27/2021   Procedure: ESOPHAGOGASTRODUODENOSCOPY (EGD) WITH PROPOFOL;  Surgeon: Irene Shipper, MD;  [facility];  Service: Endoscopy;  Laterality: N/A;   ESOPHAGOGASTRODUODENOSCOPY (EGD) WITH PROPOFOL N/A 01/04/2022   Procedure: ESOPHAGOGASTRODUODENOSCOPY (EGD) WITH PROPOFOL;   Surgeon: Irene Shipper, MD;  [facility];  Service: Endoscopy;  Laterality: N/A;   HAMMER TOE SURGERY Left 2012   pins placed in great toe, 2nd,3rd, 4th toes, all pins removed except great toe   IR ANGIOGRAM SELECTIVE EACH ADDITIONAL VESSEL  12/16/2021   IR ANGIOGRAM SELECTIVE EACH ADDITIONAL VESSEL  12/16/2021   IR ANGIOGRAM SELECTIVE EACH ADDITIONAL VESSEL  12/22/2021   IR ANGIOGRAM VISCERAL SELECTIVE  12/16/2021   IR ANGIOGRAM VISCERAL SELECTIVE  12/22/2021   IR EMBO TUMOR ORGAN ISCHEMIA INFARCT INC GUIDE ROADMAPPING  12/22/2021   IR IMAGING GUIDED PORT INSERTION  07/07/2022   IR PARACENTESIS  11/13/2019   IR PARACENTESIS  03/04/2022   IR PATIENT EVAL TECH 0-60 MINS  05/19/2022   IR PERC PLEURAL DRAIN W/INDWELL CATH W/IMG GUIDE  03/09/2022   IR RADIOLOGIST EVAL & MGMT  08/16/2019   IR RADIOLOGIST EVAL & MGMT  02/06/2020   IR RADIOLOGIST EVAL & MGMT  06/26/2020   IR RADIOLOGIST EVAL & MGMT  12/31/2020   IR RADIOLOGIST EVAL & MGMT  07/21/2021   IR RADIOLOGIST EVAL & MGMT  10/08/2021   IR RADIOLOGIST EVAL & MGMT  01/13/2022   IR RADIOLOGIST EVAL & MGMT  03/05/2022   IR THORACENTESIS ASP PLEURAL SPACE W/IMG GUIDE  10/12/2021   IR THORACENTESIS ASP PLEURAL SPACE W/IMG GUIDE  10/26/2021   IR THORACENTESIS ASP PLEURAL SPACE W/IMG GUIDE  11/19/2021   IR THORACENTESIS ASP PLEURAL SPACE W/IMG GUIDE  12/22/2021   IR US GUIDE VASC ACCESS RIGHT  12/16/2021   IR US GUIDE VASC ACCESS RIGHT  12/22/2021   PELVIC LAPAROSCOPY Bilateral 1992   RADIOLOGY WITH ANESTHESIA N/A 09/16/2021   Procedure: CT MICROWAVE ABLATION;  Surgeon: Aletta Edouard, MD;  Location: [facility];  Service: Radiology;  Laterality: N/A;   UPPER GASTROINTESTINAL ENDOSCOPY  2002   had esophageal dilitation    I have reviewed the social history and family history with the patient and they are unchanged from previous note.  ALLERGIES:  is allergic to hydromorphone hcl and aspirin.  MEDICATIONS:  Current Outpatient Medications  Medication  Sig Dispense Refill   anastrozole (ARIMIDEX) 1 MG tablet Take 1 tablet (1 mg total) by mouth daily. 30 tablet 2   baclofen (LIORESAL) 10 MG tablet Take 5 mg by mouth daily as needed for muscle spasms.     dicyclomine (BENTYL) 20 MG tablet Take 0.5 tablets (10 mg total) by mouth every 6 (six) hours as needed for spasms. Take one tablet every 6-8 hours as needed for abdominal cramping (Patient taking differently: Take 20 mg by mouth daily as needed (abdominal cramping).) 90 tablet 0   guaiFENesin-codeine 100-10 MG/5ML syrup Take 5 mLs by mouth every 6 (six) hours as needed for cough. 120 mL 0   IBRANCE 125 MG tablet TAKE 1 TABLET DAILY FOR 21 DAYS ON, THEN 7 DAYS OFF. REPEAT EVERY 28 DAYS 21 tablet 0   lidocaine-prilocaine (EMLA) cream Apply 1 Application topically as needed. 30 g 2  nicotine (NICODERM CQ - DOSED IN MG/24 HOURS) 21 mg/24hr patch Place 21 mg onto the skin daily as needed (smoking cessation on work days).     oxyCODONE (OXY IR/ROXICODONE) 5 MG immediate release tablet Take 1 tablet (5 mg total) by mouth every 6 (six) hours as needed for severe pain. 60 tablet 0   pantoprazole (PROTONIX) 40 MG tablet TAKE 1 TABLET BY MOUTH EVERY DAY 90 tablet 1   Prenatal Vit-Fe Fumarate-FA (PRENATAL PO) Take 1 tablet by mouth daily.     promethazine (PHENERGAN) 12.5 MG tablet Take 1 tablet (12.5 mg total) by mouth daily as needed for nausea or vomiting. 20 tablet 3   No current facility-administered medications for this visit.    PHYSICAL EXAMINATION: ECOG PERFORMANCE STATUS: 2 - Symptomatic, <50% confined to bed  Vitals:   08/30/22 1025  BP: 105/71  Pulse: 92  Resp: 16  Temp: 97.7 F (36.5 C)  SpO2: 97%   Wt Readings from Last 3 Encounters:  08/30/22 87 lb 3.2 oz (39.6 kg)  08/09/22 83 lb 12.8 oz (38 kg)  07/19/22 84 lb 8 oz (38.3 kg)     GENERAL:alert, no distress and comfortable SKIN: skin color normal, no rashes or significant lesions EYES: normal, Conjunctiva are pink and  non-injected, sclera clear  NEURO: alert & oriented x 3 with fluent speech  LABORATORY DATA:  I have reviewed the data as listed    Latest Ref Rng & Units 08/30/2022    9:52 AM 08/09/2022    9:53 AM 07/19/2022   10:01 AM  CBC  WBC 4.0 - 10.5 K/uL 5.7  10.3  7.1   Hemoglobin 12.0 - 15.0 g/dL 9.7  10.7  10.5   Hematocrit 36.0 - 46.0 % 30.8  32.7  32.5   Platelets 150 - 400 K/uL 92  98  153         Latest Ref Rng & Units 08/30/2022    9:52 AM 08/09/2022    9:53 AM 07/19/2022   10:01 AM  CMP  Glucose 70 - 99 mg/dL 129  150  131   BUN 6 - 20 mg/dL 27  40  24   Creatinine 0.44 - 1.00 mg/dL 0.91  1.01  0.71   Sodium 135 - 145 mmol/L 127  122  126   Potassium 3.5 - 5.1 mmol/L 5.2  5.7  4.8   Chloride 98 - 111 mmol/L 103  99  102   CO2 22 - 32 mmol/L _0 Calcium 8.9 - 10.3 mg/dL 7.7  8.1  8.1   Total Protein 6.5 - 8.1 g/dL 5.1  5.7  5.6   Total Bilirubin 0.3 - 1.2 mg/dL 1.5  1.4  1.3   Alkaline Phos 38 - 126 U/L 87  77  79   AST 15 - 41 U/L 31  37  40   ALT 0 - 44 U/L _1 RADIOGRAPHIC STUDIES: I have personally reviewed the radiological images as listed and agreed with the findings in the report. No results found.    Orders Placed This Encounter  Procedures   CBC with Differential ([place] Only)    Standing Status:   Future    Standing Expiration Date:   09/21/2023   CMP ([place] only)    Standing Status:   Future    Standing Expiration Date:   09/21/2023   All questions were answered. The patient  knows to call the clinic with any problems, questions or concerns. No barriers to learning was detected. The total time spent in the appointment was 30 minutes.     Truitt Merle, MD 08/30/2022   I, Wilburn Mylar, am acting as scribe for Truitt Merle, MD.   I have reviewed the above documentation for accuracy and completeness, and I agree with the above.

## 2022-08-30 NOTE — Patient Instructions (Signed)
[facility]  Discharge Instructions: Thank you for choosing [facility] to provide your oncology and hematology care.   If you have a lab appointment with the [facility], please go directly to the [facility] and check in at the registration area.   Wear comfortable clothing and clothing appropriate for easy access to any Portacath or PICC line.   We strive to give you quality time with your provider. You may need to reschedule your appointment if you arrive late (15 or more minutes).  Arriving late affects you and other patients whose appointments are after yours.  Also, if you miss three or more appointments without notifying the office, you may be dismissed from the clinic at the provider's discretion.      For prescription refill requests, have your pharmacy contact our office and allow 72 hours for refills to be completed.    Today you received the following chemotherapy and/or immunotherapy agents: atezolizumab and [id number]      To help prevent nausea and vomiting after your treatment, we encourage you to take your nausea medication as directed.  BELOW ARE SYMPTOMS THAT SHOULD BE REPORTED IMMEDIATELY: *FEVER GREATER THAN 100.4 F (38 C) OR HIGHER *CHILLS OR SWEATING *NAUSEA AND VOMITING THAT IS NOT CONTROLLED WITH YOUR NAUSEA MEDICATION *UNUSUAL SHORTNESS OF BREATH *UNUSUAL BRUISING OR BLEEDING *URINARY PROBLEMS (pain or burning when urinating, or frequent urination) *BOWEL PROBLEMS (unusual diarrhea, constipation, pain near the anus) TENDERNESS IN MOUTH AND THROAT WITH OR WITHOUT PRESENCE OF ULCERS (sore throat, sores in mouth, or a toothache) UNUSUAL RASH, SWELLING OR PAIN  UNUSUAL VAGINAL DISCHARGE OR ITCHING   Items with * indicate a potential emergency and should be followed up as soon as possible or go to the Emergency Department if any problems should occur.  Please show the CHEMOTHERAPY ALERT CARD or IMMUNOTHERAPY  ALERT CARD at check-in to the Emergency Department and triage nurse.  Should you have questions after your visit or need to cancel or reschedule your appointment, please contact [facility]  Dept: [phone]  and follow the prompts.  Office hours are 8:00 a.m. to 4:30 p.m. Monday - Friday. Please note that voicemails left after 4:00 p.m. may not be returned until the following business day.  We are closed weekends and major holidays. You have access to a nurse at all times for urgent questions. Please call the main number to the clinic Dept: [phone] and follow the prompts.   For any non-urgent questions, you may also contact your provider using MyChart. We now offer e-Visits for anyone 50 and older to request care online for non-urgent symptoms. For details visit [email or web address].   Also download the MyChart app! Go to the app store, search "MyChart", open the app, select [facility], and log in with your MyChart username and password.  Masks are optional in the cancer centers. If you would like for your care team to wear a mask while they are taking care of you, please let them know. You may have one support person who is at least 55 years old accompany you for your appointments.

## 2022-08-31 ENCOUNTER — Encounter: Payer: Self-pay | Admitting: Hematology

## 2022-08-31 ENCOUNTER — Telehealth: Payer: Self-pay | Admitting: Hematology

## 2022-08-31 LAB — AFP TUMOR MARKER: AFP, Serum, Tumor Marker: 6.6 ng/mL (ref 0.0–9.2)

## 2022-08-31 NOTE — Telephone Encounter (Signed)
Left patient a voicemail about next scheduled appointments

## 2022-09-01 ENCOUNTER — Other Ambulatory Visit: Payer: Self-pay

## 2022-09-01 ENCOUNTER — Other Ambulatory Visit: Payer: Self-pay | Admitting: Hematology

## 2022-09-01 ENCOUNTER — Ambulatory Visit (HOSPITAL_COMMUNITY)
Admission: RE | Admit: 2022-09-01 | Discharge: 2022-09-01 | Disposition: A | Discharge status: Home / Self Care | Payer: BC Managed Care – PPO | Source: Ambulatory Visit | Attending: Interventional Radiology | Admitting: Interventional Radiology

## 2022-09-01 ENCOUNTER — Encounter (HOSPITAL_COMMUNITY): Payer: Self-pay

## 2022-09-01 DIAGNOSIS — J90 Pleural effusion, not elsewhere classified: Secondary | ICD-10-CM | POA: Diagnosis not present

## 2022-09-01 DIAGNOSIS — Z4682 Encounter for fitting and adjustment of non-vascular catheter: Secondary | ICD-10-CM | POA: Diagnosis not present

## 2022-09-01 DIAGNOSIS — D649 Anemia, unspecified: Secondary | ICD-10-CM

## 2022-09-01 DIAGNOSIS — C220 Liver cell carcinoma: Secondary | ICD-10-CM | POA: Diagnosis not present

## 2022-09-01 HISTORY — PX: IR PERC PLEURAL DRAIN W/INDWELL CATH W/IMG GUIDE: IMG5383

## 2022-09-01 LAB — CBC WITH DIFFERENTIAL/PLATELET
Abs Immature Granulocytes: 0.04 10*3/uL (ref 0.00–0.07)
Basophils Absolute: 0.1 10*3/uL (ref 0.0–0.1)
Basophils Relative: 1 %
Eosinophils Absolute: 0 10*3/uL (ref 0.0–0.5)
Eosinophils Relative: 1 %
HCT: 32.9 % — ABNORMAL LOW (ref 36.0–46.0)
Hemoglobin: 10.1 g/dL — ABNORMAL LOW (ref 12.0–15.0)
Immature Granulocytes: 1 %
Lymphocytes Relative: 4 %
Lymphs Abs: 0.3 10*3/uL — ABNORMAL LOW (ref 0.7–4.0)
MCH: 24.2 pg — ABNORMAL LOW (ref 26.0–34.0)
MCHC: 30.7 g/dL (ref 30.0–36.0)
MCV: 78.9 fL — ABNORMAL LOW (ref 80.0–100.0)
Monocytes Absolute: 0.1 10*3/uL (ref 0.1–1.0)
Monocytes Relative: 2 %
Neutro Abs: 5.9 10*3/uL (ref 1.7–7.7)
Neutrophils Relative %: 91 %
Platelets: 80 10*3/uL — ABNORMAL LOW (ref 150–400)
RBC: 4.17 MIL/uL (ref 3.87–5.11)
RDW: 25.3 % — ABNORMAL HIGH (ref 11.5–15.5)
WBC: 6.4 10*3/uL (ref 4.0–10.5)
nRBC: 0 % (ref 0.0–0.2)

## 2022-09-01 LAB — PROTIME-INR
INR: 1.5 — ABNORMAL HIGH (ref 0.8–1.2)
Prothrombin Time: 18.1 seconds — ABNORMAL HIGH (ref 11.4–15.2)

## 2022-09-01 LAB — BASIC METABOLIC PANEL
Anion gap: 6 (ref 5–15)
BUN: 30 mg/dL — ABNORMAL HIGH (ref 6–20)
CO2: 18 mmol/L — ABNORMAL LOW (ref 22–32)
Calcium: 7.7 mg/dL — ABNORMAL LOW (ref 8.9–10.3)
Chloride: 104 mmol/L (ref 98–111)
Creatinine, Ser: 1.01 mg/dL — ABNORMAL HIGH (ref 0.44–1.00)
GFR, Estimated: >60 mL/min (ref >60)
Glucose, Bld: 111 mg/dL — ABNORMAL HIGH (ref 70–99)
Potassium: 5.1 mmol/L (ref 3.5–5.1)
Sodium: 128 mmol/L — ABNORMAL LOW (ref 135–145)

## 2022-09-01 MED ORDER — HEPARIN SOD (PORK) LOCK FLUSH 100 UNIT/ML IV SOLN
500.0000 [IU] | INTRAVENOUS | Status: AC | PRN
  Administered 2022-09-01: 500 [IU]
  Filled 2022-09-01: qty 5

## 2022-09-01 MED ORDER — LIDOCAINE HCL 1 % IJ SOLN
INTRAMUSCULAR | Status: AC
  Administered 2022-09-01: 18 mL via INTRADERMAL
  Filled 2022-09-01: qty 20

## 2022-09-01 MED ORDER — MIDAZOLAM HCL 2 MG/2ML IJ SOLN
INTRAMUSCULAR | Status: DC | PRN
  Administered 2022-09-01 (×2): .5 mg via INTRAVENOUS
  Administered 2022-09-01: 1 mg via INTRAVENOUS

## 2022-09-01 MED ORDER — FENTANYL CITRATE PF 50 MCG/ML IJ SOSY
PREFILLED_SYRINGE | INTRAMUSCULAR | Status: AC
  Filled 2022-09-01: qty 2

## 2022-09-01 MED ORDER — SODIUM CHLORIDE 0.9 % IV SOLN: INTRAVENOUS | Status: DC

## 2022-09-01 MED ORDER — CEFAZOLIN SODIUM-DEXTROSE 2-4 GM/100ML-% IV SOLN
2.0000 g | INTRAVENOUS | Status: AC
  Administered 2022-09-01: 2 g via INTRAVENOUS
  Filled 2022-09-01: qty 100

## 2022-09-01 MED ORDER — FENTANYL CITRATE (PF) 100 MCG/2ML IJ SOLN
INTRAMUSCULAR | Status: DC | PRN
  Administered 2022-09-01 (×2): 25 ug via INTRAVENOUS

## 2022-09-01 MED ORDER — MIDAZOLAM HCL 2 MG/2ML IJ SOLN
INTRAMUSCULAR | Status: AC
  Filled 2022-09-01: qty 4

## 2022-09-01 NOTE — Procedures (Signed)
Interventional Radiology Procedure Note  Procedure:   Image guided exchange of left pleural tunneled catheter.  Previous with cuff exposure.  ~900cc drained at this time.  Complications: None Recommendations:  - Ok to use - Do not submerge - Routine drain care - retention stitch is placed.  Patient will need to return for stitch removal at hospital or our clinic in 2 weeks after tract is sealed.    Signed,  Dulcy Fanny. Earleen Newport, DO

## 2022-09-01 NOTE — Discharge Instructions (Addendum)
Please call Interventional Radiology clinic [phone] with any questions or concerns.  You may remove your dressing and shower tomorrow. Moderate Conscious Sedation, Adult, Care After This sheet gives you information about how to care for yourself after your procedure. Your health care provider may also give you more specific instructions. If you have problems or questions, contact your health careprovider. What can I expect after the procedure? After the procedure, it is common to have: Sleepiness for several hours. Impaired judgment for several hours. Difficulty with balance. Vomiting if you eat too soon. Follow these instructions at home: For the time period you were told by your health care provider: Rest. Do not participate in activities where you could fall or become injured. Do not drive or use machinery. Do not drink alcohol. Do not take sleeping pills or medicines that cause drowsiness. Do not make important decisions or sign legal documents. Do not take care of children on your own. Eating and drinking Follow the diet recommended by your health care provider. Drink enough fluid to keep your urine pale yellow. If you vomit: Drink water, juice, or soup when you can drink without vomiting. Make sure you have little or no nausea before eating solid foods.  General instructions Take over-the-counter and prescription medicines only as told by your health care provider. Have a responsible adult stay with you for the time you are told. It is important to have someone help care for you until you are awake and alert. Do not smoke. Keep all follow-up visits as told by your health care provider. This is important. Contact a health care provider if: You are still sleepy or having trouble with balance after 24 hours. You feel light-headed. You keep feeling nauseous or you keep vomiting. You develop a rash. You have a fever. You have redness or swelling around the IV site. Get  help right away if: You have trouble breathing. You have new-onset confusion at home. Summary After the procedure, it is common to feel sleepy, have impaired judgment, or feel nauseous if you eat too soon. Rest after you get home. Know the things you should not do after the procedure. Follow the diet recommended by your health care provider and drink enough fluid to keep your urine pale yellow. Get help right away if you have trouble breathing or new-onset confusion at home. This information is not intended to replace advice given to you by your health care provider. Make sure you discuss any questions you have with your healthcare provider. Document Revised: 03/14/2020 Document Reviewed: 10/11/2019 Elsevier Patient Education  2022 [place].   Indwelling Pleural Catheter Home Guide (Pleurex Drain)  An indwelling pleural catheter is a thin, flexible tube that is inserted under your skin and into your chest. The catheter drains excess fluid that collects in the area between the chest wall and the lungs (pleural space). After the catheter is inserted, it can be attached to a bottle that collects fluid. The pleural catheter will allow you to drain fluid from your chest at home on a regular basis (sometimes daily). This will eliminate the need for frequent visits to the hospital or clinic to drain the fluid. The catheter may be removed after the excess fluid problem is resolved, usually after 2-3 months. It is important to follow instructions from your health care provider about how to drain and care for your catheter. What are the risks? Generally, this is a safe procedure. However, problems may occur, including: Infection. Skin damage around the catheter.  Lung damage. Failure of the chest tube to work properly. Spreading of cancer cells along the catheter, if you have cancer. Supplies needed: Vacuum-sealed drainage bottle with attached drainage line. Sterile dressing. Sterile alcohol  pads. Sterile gloves. Valve cap. Sterile gauze pads, 4  4 inch (10 cm  10 cm). Tape. Adhesive dressing. Sterile foam catheter pad. How to care for your catheter and insertion site Wash your hands with soap and warm water before and after touching the catheter or insertion site. If soap and water are not available, use hand sanitizer. Check your bandage (dressing) daily to make sure it is clean and dry. Keep the skin around the catheter clean and dry. Check the catheter regularly for any cracks or kinks in the tubing. Check your catheter insertion site every day for signs of infection. Check for: Skin breakdown. Redness, swelling, or pain. Fluid or blood. Warmth. Pus or a bad smell. How to drain your catheter You may need to drain your catheter every day, or more or less often as told by your health care provider. Follow instructions from your health care provider about how to drain your catheter. You may also refer to instructions that come with the drainage system. To drain the catheter: Wash your hands with soap and warm water. If soap and water are not available, use hand sanitizer. Carefully remove the dressing from around the catheter. Wash your hands again. Put on the gloves provided. Prepare the vacuum-sealed drainage bottle and drainage line. Close the drainage line of the vacuum-sealed drainage bottle by squeezing the pinch clamp or rolling the wheel of the roller clamp toward the bottle. The vacuum in the bottle will be lost if the line is not closed completely. Remove the access tip cover from the drainage line. Do not touch the end. Set it on a sterile surface. Remove the catheter valve cap and throw it away. Use an alcohol pad to clean the end of the catheter. Insert the access tip into the catheter valve. Make sure the valve and access tip are securely connected. Listen for a click to confirm that they are connected. Insert the T plunger to break the vacuum seal on the  drainage bottle. Open the clamp on the drainage line. Allow the catheter to drain. Keep the catheter and the drainage bottle below the level of your chest. There may be a one-way valve on the end of the tubing that will allow liquid and air to flow out of the catheter without letting air inside. Drain the amount of fluid as told by your health care provider. It usually takes 5-15 minutes. Do not drain more than 1000 mL of fluid. You may feel a little discomfort while you are draining. If the pain is severe, stop draining and contact your health care provider. After you finish draining the catheter, remove the drainage bottle tubing from the catheter. Use a clean alcohol pad to wipe the catheter tip. Place a clean cap on the end of the catheter. Use an alcohol pad to clean the skin around the catheter. Allow the skin to air-dry. Put the catheter pad on your skin. Curl the catheter into loops and place it on the pad. Do not place the catheter on your skin. Replace the dressing over the catheter. Discard the drainage bottle as instructed by your health care provider. Do not reuse the drainage bottle. How to change your dressing Change your dressing at least once a week, or more often if needed to keep the dressing  dry. Be sure to change the dressing whenever it becomes moist. Your health care provider will tell you how often to change your dressing. Wash your hands with soap and warm water. If soap and water are not available, use hand sanitizer. Gently remove the old dressing. Avoid using scissors to remove the dressing. Sharp objects may damage the catheter. Wash the skin around the insertion site with mild, fragrance-free soap and warm water. Rinse well, then pat the area dry with a clean cloth. Check the skin around the catheter for signs of infection. Check for: Skin breakdown. Redness, swelling, or pain. Fluid or blood. Warmth. Pus or a bad smell. If your catheter was stitched (sutured) to  your skin, look at the suture to make sure it is still anchored in your skin. Do not apply creams, ointments, or alcohol to the area. Let your skin air-dry completely before you apply a new dressing. Curl the catheter into loops and place it on the sterile catheter pad. Do not place the catheter on your skin. If you do not have a pad, use a clean dressing. Slide the dressing under the disk that holds the drainage catheter in place. Use gauze to cover the catheter and the catheter pad. The catheter should rest on the pad or dressing, not on your skin. Tape the dressing to your skin. You may be instructed to use an adhesive dressing covering instead of gauze and tape. Wash your hands with soap and warm water. If soap and water are not available, use hand sanitizer. General recommendations Always wash your hands with soap and warm water before and after caring for your catheter and drainage bottle. Use a mild, fragrance-free soap. If soap and water are not available, use hand sanitizer. Always make sure there are no leaks in the catheter or drainage bottle. Each time you drain the catheter, note the color and amount of fluid. Do not touch the tip of the catheter or the drainage bottle tubing. Do not reuse drainage bottles. Do not take baths, swim, or use a hot tub until your health care provider approves. Ask your health care provider if you may take showers. You may only be allowed to take sponge baths. Take deep breaths regularly, followed by a cough. Doing this can help to prevent lung infection. Contact a health care provider if: You have any questions about caring for your catheter or drainage bottle. You still have pain at the catheter insertion site more than 2 days after your procedure. You have pain while draining your catheter. Your catheter becomes bent, twisted, or cracked. The connection between the catheter and the collection bottle becomes loose. You have any of these around your  catheter insertion site or coming from it: Skin breakdown. Redness, swelling, or pain. Fluid or blood. Warmth. Pus or a bad smell. Get help right away if: You have a fever or chills. You have chest pain. You have dizziness or shortness of breath. You have severe redness, swelling, or pain at your catheter insertion site. The catheter comes out. The catheter is blocked or clogged. Summary An indwelling pleural catheter is a thin, flexible tube that is inserted under your skin and into your chest. The catheter drains excess fluid that collects in the area between the chest wall and the lungs (pleural space).  Moderate Conscious Sedation, Adult, Care After This sheet gives you information about how to care for yourself after your procedure. Your health care provider may also give you more specific instructions. If  you have problems or questions, contact your health care provider. What can I expect after the procedure? After the procedure, it is common to have: Sleepiness for several hours. Impaired judgment for several hours. Difficulty with balance. Vomiting if you eat too soon. Follow these instructions at home: For the time period you were told by your health care provide Rest. Do not participate in activities where you could fall or become injured. Do not drive or use machinery. Do not drink alcohol. Do not take sleeping pills or medicines that cause drowsiness. Do not make important decisions or sign legal documents. Do not take care of children on your own. Eating and drinking  Follow the diet recommended by your health care provider. Drink enough fluid to keep your urine pale yellow. If you vomit: Drink water, juice, or soup when you can drink without vomiting. Make sure you have little or no nausea before eating solid foods. General instructions Take over-the-counter and prescription medicines only as told by your health care provider. Have a responsible adult stay with  you for the time you are told. It is important to have someone help care for you until you are awake and alert. Do not smoke. Keep all follow-up visits as told by your health care provider. This is important. Contact a health care provider if: You are still sleepy or having trouble with balance after 24 hours. You feel light-headed. You keep feeling nauseous or you keep vomiting. You develop a rash. You have a fever. You have redness or swelling around the IV site. Get help right away if: You have trouble breathing. You have new-onset confusion at home. Summary After the procedure, it is common to feel sleepy, have impaired judgment, or feel nauseous if you eat too soon. Rest after you get home. Know the things you should not do after the procedure. Follow the diet recommended by your health care provider and drink enough fluid to keep your urine pale yellow. Get help right away if you have trouble breathing or new-onset confusion at home. This information is not intended to replace advice given to you by your health care provider. Make sure you discuss any questions you have with your health care provider. Document Revised: 03/14/2020 Document Reviewed: 10/11/2019 Elsevier Patient Education  [place]. Follow instructions from your health care provider about how to drain and care for your catheter. Do not touch the tip of the catheter or the drainage bottle tubing. Always wash your hands with soap and water before and after caring for your catheter and drainage bottle. If soap and water are not available, use hand sanitizer. This information is not intended to replace advice given to you by your health care provider. Make sure you discuss any questions you have with your health care provider. Document Revised: 03/09/2019 Document Reviewed: 03/10/2017 Elsevier Patient Education  [place].

## 2022-09-01 NOTE — H&P (Signed)
Referring Physician(s): Feng,Y  Supervising Physician: Corrie Mckusick  Patient Status:  [facility]  Chief Complaint: Weakness, left pleuritic pain, dyspnea, exposed left pleurx cuff, hepatocellular carcinoma   Subjective: Patient familiar to IR service from random liver biopsy in 2004, multiple paracenteses/ thoracenteses, microwave ablation of left hepatocellular carcinoma in 2022, right hepatic Y90 treatment on 12/22/2021, left Pleurx catheter placement on 03/09/2022 and Port-A-Cath placement on 07/07/2022.  She has a past medical history significant for right breast carcinoma, portal hypertension, esophageal varices, cirrhosis, hepatitis C and hepatocellular carcinoma.  She presents now with weeklong history of pain at left Pleurx insertion site with exposed cuff. She is scheduled today for left Pleurx catheter replacement/exchange.  She currently denies fever, headache, abdominal pain, nausea, vomiting or bleeding.  She does have occasional cough, dyspnea, back/shoulder pain, fatigue/weakness.  Past Medical History:  Diagnosis Date   Anemia    Breast cancer ([place]) 08/30/2019   Cancer ([place])    liver- just diagnosed, not started treatment yet   Cervicalgia    COPD (chronic obstructive pulmonary disease) (HCC)    Esophageal varices (HCC)    GERD (gastroesophageal reflux disease)    Headache    Hepatic cirrhosis (HCC)    Hepatitis C    resolved 2018   Hepatocellular carcinoma (HCC)    IBS (irritable bowel syndrome)    Lactose intolerance    Personal history of radiation therapy 11/2019   Right breast   Portal hypertension ([place])    Raynaud's syndrome    Thrombocytopenia ([place])    Past Surgical History:  Procedure Laterality Date   BREAST BIOPSY Right 09/2019   BREAST LUMPECTOMY Right 09/2019   BREAST LUMPECTOMY WITH AXILLARY LYMPH NODE BIOPSY Right 10/16/2019   Procedure: RIGHT BREAST LUMPECTOMY WITH SENTINEL LYMPH NODE BIOPSY;  Surgeon: Stark Klein, MD;  Location: [facility];   Service: General;  Laterality: Right;   COLONOSCOPY     ESOPHAGEAL BANDING  10/27/2021   Procedure: ESOPHAGEAL BANDING;  Surgeon: Irene Shipper, MD;  Location: [facility];  Service: Endoscopy;;   ESOPHAGOGASTRODUODENOSCOPY (EGD) WITH PROPOFOL N/A 10/27/2021   Procedure: ESOPHAGOGASTRODUODENOSCOPY (EGD) WITH PROPOFOL;  Surgeon: Irene Shipper, MD;  [facility];  Service: Endoscopy;  Laterality: N/A;   ESOPHAGOGASTRODUODENOSCOPY (EGD) WITH PROPOFOL N/A 01/04/2022   Procedure: ESOPHAGOGASTRODUODENOSCOPY (EGD) WITH PROPOFOL;  Surgeon: Irene Shipper, MD;  Location: [facility];  Service: Endoscopy;  Laterality: N/A;   HAMMER TOE SURGERY Left 2012   pins placed in great toe, 2nd,3rd, 4th toes, all pins removed except great toe   IR ANGIOGRAM SELECTIVE EACH ADDITIONAL VESSEL  12/16/2021   IR ANGIOGRAM SELECTIVE EACH ADDITIONAL VESSEL  12/16/2021   IR ANGIOGRAM SELECTIVE EACH ADDITIONAL VESSEL  12/22/2021   IR ANGIOGRAM VISCERAL SELECTIVE  12/16/2021   IR ANGIOGRAM VISCERAL SELECTIVE  12/22/2021   IR EMBO TUMOR ORGAN ISCHEMIA INFARCT INC GUIDE ROADMAPPING  12/22/2021   IR IMAGING GUIDED PORT INSERTION  07/07/2022   IR PARACENTESIS  11/13/2019   IR PARACENTESIS  03/04/2022   IR PATIENT EVAL TECH 0-60 MINS  05/19/2022   IR PERC PLEURAL DRAIN W/INDWELL CATH W/IMG GUIDE  03/09/2022   IR RADIOLOGIST EVAL & MGMT  08/16/2019   IR RADIOLOGIST EVAL & MGMT  02/06/2020   IR RADIOLOGIST EVAL & MGMT  06/26/2020   IR RADIOLOGIST EVAL & MGMT  12/31/2020   IR RADIOLOGIST EVAL & MGMT  07/21/2021   IR RADIOLOGIST EVAL & MGMT  10/08/2021   IR RADIOLOGIST EVAL & MGMT  01/13/2022   IR RADIOLOGIST EVAL & MGMT  03/05/2022   IR THORACENTESIS ASP PLEURAL SPACE W/IMG GUIDE  10/12/2021   IR THORACENTESIS ASP PLEURAL SPACE W/IMG GUIDE  10/26/2021   IR THORACENTESIS ASP PLEURAL SPACE W/IMG GUIDE  11/19/2021   IR THORACENTESIS ASP PLEURAL SPACE W/IMG GUIDE  12/22/2021   IR US GUIDE VASC ACCESS RIGHT  12/16/2021   IR US GUIDE VASC  ACCESS RIGHT  12/22/2021   PELVIC LAPAROSCOPY Bilateral 1992   RADIOLOGY WITH ANESTHESIA N/A 09/16/2021   Procedure: CT MICROWAVE ABLATION;  Surgeon: Aletta Edouard, MD;  Location: [facility];  Service: Radiology;  Laterality: N/A;   UPPER GASTROINTESTINAL ENDOSCOPY  2002   had esophageal dilitation      Allergies: Hydromorphone hcl and Aspirin  Medications: Prior to Admission medications   Medication Sig Start Date End Date Taking? Authorizing Provider  anastrozole (ARIMIDEX) 1 MG tablet Take 1 tablet (1 mg total) by mouth daily. 08/16/22  Yes Truitt Merle, MD  baclofen (LIORESAL) 10 MG tablet Take 5 mg by mouth daily as needed for muscle spasms. 03/12/22  Yes [provider]  dicyclomine (BENTYL) 20 MG tablet Take 0.5 tablets (10 mg total) by mouth every 6 (six) hours as needed for spasms. Take one tablet every 6-8 hours as needed for abdominal cramping Patient taking differently: Take 20 mg by mouth daily as needed (abdominal cramping). 01/07/22  Yes Noralyn Pick, NP  guaiFENesin-codeine 100-10 MG/5ML syrup Take 5 mLs by mouth every 6 (six) hours as needed for cough. 08/25/22  Yes Truitt Merle, MD  IBRANCE 125 MG tablet TAKE 1 TABLET DAILY FOR 21 DAYS ON, THEN 7 DAYS OFF. REPEAT EVERY 28 DAYS 08/30/22  Yes Truitt Merle, MD  lidocaine-prilocaine (EMLA) cream Apply 1 Application topically as needed. 07/19/22  Yes Alla Feeling, NP  nicotine (NICODERM CQ - DOSED IN MG/24 HOURS) 21 mg/24hr patch Place 21 mg onto the skin daily as needed (smoking cessation on work days).   Yes [provider]  oxyCODONE (OXY IR/ROXICODONE) 5 MG immediate release tablet Take 1 tablet (5 mg total) by mouth every 6 (six) hours as needed for severe pain. 08/06/22  Yes Truitt Merle, MD  pantoprazole (PROTONIX) 40 MG tablet TAKE 1 TABLET BY MOUTH EVERY DAY 07/31/22  Yes Noralyn Pick, NP  Prenatal Vit-Fe Fumarate-FA (PRENATAL PO) Take 1 tablet by mouth daily.   Yes [provider]   promethazine (PHENERGAN) 12.5 MG tablet Take 1 tablet (12.5 mg total) by mouth daily as needed for nausea or vomiting. 08/05/22  Yes Truitt Merle, MD     Vital Signs: BP 116/75   Pulse (!) 107   Temp 98.2 F (36.8 C) (Oral)   Resp 16   Ht '5\' 3"'$  (1.6 m)   Wt 34 lb 1 oz (15.5 kg)   LMP 04/20/2013   SpO2 94%   BMI 6.03 kg/m   Physical Exam awake, alert.  Chest with diminished breath sounds left base, right clear.  Left Pleurx catheter with exposed cuff, insertion site tender to palpation.  Clean, intact right chest wall Port-A-Cath.  Heart with slightly tachycardic but regular rhythm.  Abdomen soft, positive bowel sounds, nontender.  No lower extremity edema.  Some scattered extremity ecchymoses.  Imaging: No results found.  Labs:  CBC: Recent Labs    07/09/22 1055 07/19/22 1001 08/09/22 0953 08/30/22 0952  WBC 7.1 7.1 10.3 5.7  HGB 10.3* 10.5* 10.7* 9.7*  HCT 31.2* 32.5* 32.7* 30.8*  PLT 85*  153 98* 92*    COAGS: Recent Labs    10/26/21 2001 10/27/21 0429 12/22/21 0803 02/21/22 1055 04/16/22 0457 06/01/22 1032  INR 1.8*   < > 1.3* 1.6* 1.7* 1.5*  APTT 31  --   --   --   --   --    < > = values in this interval not displayed.    BMP: Recent Labs    07/09/22 1055 07/19/22 1001 08/09/22 0953 08/30/22 0952  NA 129* 126* 122* 127*  K 3.7 4.8 5.7* 5.2*  CL 99 102 99 103  CO2 25 22 19* 21*  GLUCOSE 106* 131* 150* 129*  BUN 19 24* 40* 27*  CALCIUM 7.7* 8.1* 8.1* 7.7*  CREATININE 0.73 0.71 1.01* 0.91  GFRNONAA >60 >60 >60 >60    LIVER FUNCTION TESTS: Recent Labs    07/09/22 1055 07/19/22 1001 08/09/22 0953 08/30/22 0952  BILITOT 1.8* 1.3* 1.4* 1.5*  AST 35 40 37 31  ALT '23 23 22 19  '$ ALKPHOS 74 79 77 87  PROT 5.6* 5.6* 5.7* 5.1*  ALBUMIN 2.5* 2.5* 2.5* 2.4*    Assessment and Plan: Patient familiar to IR service from random liver biopsy in 2004, multiple paracenteses/ thoracenteses, microwave ablation of left hepatocellular carcinoma in 2022,  right hepatic Y90 treatment on 12/22/2021, left Pleurx catheter placement on 03/09/2022 and Port-A-Cath placement on 07/07/2022.  She has a past medical history significant for right breast carcinoma, portal hypertension, esophageal varices, cirrhosis, hepatitis C and hepatocellular carcinoma.  She presents now with week long history of pain at left Pleurx insertion site with exposed cuff. She is scheduled today for left Pleurx catheter replacement/exchange.  Details/risks of procedure, including but not limited to, internal bleeding, infection, injury to adjacent structures discussed with patient with her understanding and consent.   Electronically Signed: D. Rowe Robert, PA-C 09/01/2022, 9:18 AM   I spent a total of 20 minutes at the the patient's bedside AND on the patient's hospital floor or unit, greater than 50% of which was counseling/coordinating care for image guided left Pleurx catheter replacement/exchange

## 2022-09-02 ENCOUNTER — Other Ambulatory Visit: Payer: Self-pay

## 2022-09-02 DIAGNOSIS — C50919 Malignant neoplasm of unspecified site of unspecified female breast: Secondary | ICD-10-CM | POA: Diagnosis not present

## 2022-09-02 DIAGNOSIS — C220 Liver cell carcinoma: Secondary | ICD-10-CM | POA: Diagnosis not present

## 2022-09-02 DIAGNOSIS — R180 Malignant ascites: Secondary | ICD-10-CM | POA: Diagnosis not present

## 2022-09-03 ENCOUNTER — Other Ambulatory Visit (HOSPITAL_COMMUNITY): Payer: Self-pay | Admitting: Interventional Radiology

## 2022-09-03 ENCOUNTER — Other Ambulatory Visit: Payer: Self-pay

## 2022-09-03 DIAGNOSIS — C220 Liver cell carcinoma: Secondary | ICD-10-CM

## 2022-09-03 MED ORDER — OXYCODONE HCL 5 MG PO TABS: 5.0000 mg | ORAL_TABLET | Freq: Four times a day (QID) | ORAL | 0 refills | Status: DC | PRN

## 2022-09-06 ENCOUNTER — Telehealth: Payer: Self-pay | Admitting: *Deleted

## 2022-09-06 ENCOUNTER — Other Ambulatory Visit (HOSPITAL_COMMUNITY): Payer: Self-pay | Admitting: Interventional Radiology

## 2022-09-06 ENCOUNTER — Ambulatory Visit (HOSPITAL_COMMUNITY)
Admission: RE | Admit: 2022-09-06 | Discharge: 2022-09-06 | Disposition: A | Discharge status: Home / Self Care | Payer: BC Managed Care – PPO | Source: Ambulatory Visit | Attending: Interventional Radiology | Admitting: Interventional Radiology

## 2022-09-06 DIAGNOSIS — Z853 Personal history of malignant neoplasm of breast: Secondary | ICD-10-CM | POA: Diagnosis not present

## 2022-09-06 DIAGNOSIS — T82848A Pain from vascular prosthetic devices, implants and grafts, initial encounter: Secondary | ICD-10-CM | POA: Diagnosis not present

## 2022-09-06 DIAGNOSIS — R0781 Pleurodynia: Secondary | ICD-10-CM | POA: Diagnosis not present

## 2022-09-06 DIAGNOSIS — C220 Liver cell carcinoma: Secondary | ICD-10-CM | POA: Diagnosis not present

## 2022-09-06 HISTORY — PX: IR RADIOLOGIST EVAL & MGMT: IMG5224

## 2022-09-06 MED ORDER — LIDOCAINE HCL 1 % IJ SOLN
INTRAMUSCULAR | Status: AC
  Filled 2022-09-06: qty 20

## 2022-09-06 NOTE — Procedures (Signed)
Patient came in with pain at site, Dr. Serafina Royals removed stitch and placed a 0-silk suture. Patient has previous scheduled appointment for next week to follow up with  Dr. Earleen Newport

## 2022-09-06 NOTE — Progress Notes (Signed)
    CC:  Pain at pleurex catheter insertion site  Subjective: 56 year old female with history of HCC and breast ca with recurrent left pleural effusion status post left pleurex catheter placement on 03/09/22 and replacement on 09/01/22 due to leaking.  She presents with complaints of severe pain at skin entry site, and complaints of pleuritic pain upon drainage of pleurex that typically occurs toward the end of drainage.  She is managing this marginally with oxycodone.   Objective:  Physical Exam Constitutional:      General: She is not in acute distress. HENT:     Head: Normocephalic.     Mouth/Throat:     Mouth: Mucous membranes are moist.  Eyes:     General: No scleral icterus. Cardiovascular:     Rate and Rhythm: Normal rate.  Pulmonary:     Effort: Pulmonary effort is normal.     Comments: Left chest indwelling pleurex.  Mild erythema and induration surrounding indwelling retention suture. Abdominal:     General: There is no distension.  Skin:    Coloration: Skin is not jaundiced.  Neurological:     Mental Status: She is alert and oriented to person, place, and time.     Imaging: None pertinent  Labs: No new labs  Assessment and Plan: 56 year old female with history of HCC and breast ca with recurrent left pleural effusion status post left pleurex catheter placement on 03/09/22 and replacement on 09/01/22 due to leaking.  She presents with complaints of severe pain at skin entry site, and complaints of pleuritic pain upon drainage of pleurex that typically occurs toward the end of drainage.   The stitch was removed and a new 0-silk suture was placed inferior to the drain to prevent constant pulling.  Recommendations: -QD or BID application of antibiotic ointment to drain entry site -decreased volume of drainage to approximately 200 mL per day to prevent pleuritic pain associated with drain - if this is successful but SOB is persistent or increasing, can gradually  increase volume drained per day -has follow up next week planned with Dr. Earleen Newport to assess progress  Electronically Signed: Suzette Battiest, MD 09/06/2022, 10:19 AM   I spent a total of 25 Minutes at the the patient's bedside AND on the patient's hospital floor or unit, greater than 50% of which was counseling/coordinating care for indwelling pleurex catheter care.

## 2022-09-06 NOTE — Telephone Encounter (Signed)
Spoke with pt and was informed that she has appt for lab on 10/10.  Pt informed nurse that she has been feeling weak for past week now.  Stated she started feeling weak after receiving IVF last week.  Currently has 1 more week taking Ibrance,  then will be off for 7 days next Monday.  Pt associated feeling weak, not drinking much PO fluids at home, passing minimal urine Without  foul odor with receiving IVF.   Confirmed with pt that she did not want to have IVF tomorrow,  just coming in for labs only.   Pt wanted to know if [name] would order additional labs based on her symptoms. Informed pt that message will be sent to [name] for review on Tuesday 10/10.  Theresa Rocha's   phone    [phone].

## 2022-09-07 ENCOUNTER — Other Ambulatory Visit: Payer: BC Managed Care – PPO

## 2022-09-07 ENCOUNTER — Inpatient Hospital Stay: Payer: BC Managed Care – PPO

## 2022-09-07 ENCOUNTER — Other Ambulatory Visit: Payer: Self-pay

## 2022-09-07 ENCOUNTER — Other Ambulatory Visit: Payer: Self-pay | Admitting: Hematology

## 2022-09-07 ENCOUNTER — Inpatient Hospital Stay (HOSPITAL_BASED_OUTPATIENT_CLINIC_OR_DEPARTMENT_OTHER): Payer: BC Managed Care – PPO | Admitting: Hematology

## 2022-09-07 ENCOUNTER — Other Ambulatory Visit: Payer: Self-pay | Admitting: Nurse Practitioner

## 2022-09-07 VITALS — BP 103/69 | HR 95 | Resp 16

## 2022-09-07 DIAGNOSIS — K219 Gastro-esophageal reflux disease without esophagitis: Secondary | ICD-10-CM | POA: Diagnosis not present

## 2022-09-07 DIAGNOSIS — Z79899 Other long term (current) drug therapy: Secondary | ICD-10-CM | POA: Diagnosis not present

## 2022-09-07 DIAGNOSIS — C220 Liver cell carcinoma: Secondary | ICD-10-CM

## 2022-09-07 DIAGNOSIS — K7460 Unspecified cirrhosis of liver: Secondary | ICD-10-CM | POA: Diagnosis not present

## 2022-09-07 DIAGNOSIS — Z170 Estrogen receptor positive status [ER+]: Secondary | ICD-10-CM | POA: Diagnosis not present

## 2022-09-07 DIAGNOSIS — R5383 Other fatigue: Secondary | ICD-10-CM | POA: Diagnosis not present

## 2022-09-07 DIAGNOSIS — D61818 Other pancytopenia: Secondary | ICD-10-CM | POA: Diagnosis not present

## 2022-09-07 DIAGNOSIS — C50111 Malignant neoplasm of central portion of right female breast: Secondary | ICD-10-CM | POA: Diagnosis not present

## 2022-09-07 DIAGNOSIS — F419 Anxiety disorder, unspecified: Secondary | ICD-10-CM | POA: Diagnosis not present

## 2022-09-07 DIAGNOSIS — J90 Pleural effusion, not elsewhere classified: Secondary | ICD-10-CM | POA: Diagnosis not present

## 2022-09-07 DIAGNOSIS — I959 Hypotension, unspecified: Secondary | ICD-10-CM | POA: Diagnosis not present

## 2022-09-07 DIAGNOSIS — Z5112 Encounter for antineoplastic immunotherapy: Secondary | ICD-10-CM | POA: Diagnosis not present

## 2022-09-07 DIAGNOSIS — D696 Thrombocytopenia, unspecified: Secondary | ICD-10-CM

## 2022-09-07 DIAGNOSIS — Z515 Encounter for palliative care: Secondary | ICD-10-CM | POA: Diagnosis not present

## 2022-09-07 DIAGNOSIS — Z79811 Long term (current) use of aromatase inhibitors: Secondary | ICD-10-CM | POA: Diagnosis not present

## 2022-09-07 DIAGNOSIS — E871 Hypo-osmolality and hyponatremia: Secondary | ICD-10-CM | POA: Diagnosis not present

## 2022-09-07 DIAGNOSIS — R161 Splenomegaly, not elsewhere classified: Secondary | ICD-10-CM | POA: Diagnosis not present

## 2022-09-07 LAB — CBC WITH DIFFERENTIAL (CANCER CENTER ONLY)
Abs Immature Granulocytes: 0.01 10*3/uL (ref 0.00–0.07)
Basophils Absolute: 0 10*3/uL (ref 0.0–0.1)
Basophils Relative: 1 %
Eosinophils Absolute: 0 10*3/uL (ref 0.0–0.5)
Eosinophils Relative: 1 %
HCT: 30.4 % — ABNORMAL LOW (ref 36.0–46.0)
Hemoglobin: 9.9 g/dL — ABNORMAL LOW (ref 12.0–15.0)
Immature Granulocytes: 1 %
Lymphocytes Relative: 19 %
Lymphs Abs: 0.4 10*3/uL — ABNORMAL LOW (ref 0.7–4.0)
MCH: 25.6 pg — ABNORMAL LOW (ref 26.0–34.0)
MCHC: 32.6 g/dL (ref 30.0–36.0)
MCV: 78.8 fL — ABNORMAL LOW (ref 80.0–100.0)
Monocytes Absolute: 0.1 10*3/uL (ref 0.1–1.0)
Monocytes Relative: 6 %
Neutro Abs: 1.6 10*3/uL — ABNORMAL LOW (ref 1.7–7.7)
Neutrophils Relative %: 72 %
Platelet Count: 16 10*3/uL — ABNORMAL LOW (ref 150–400)
RBC: 3.86 MIL/uL — ABNORMAL LOW (ref 3.87–5.11)
RDW: 26.4 % — ABNORMAL HIGH (ref 11.5–15.5)
WBC Count: 2.2 10*3/uL — ABNORMAL LOW (ref 4.0–10.5)
nRBC: 0 % (ref 0.0–0.2)

## 2022-09-07 LAB — CMP (CANCER CENTER ONLY)
ALT: 15 U/L (ref 0–44)
AST: 28 U/L (ref 15–41)
Albumin: 2.5 g/dL — ABNORMAL LOW (ref 3.5–5.0)
Alkaline Phosphatase: 67 U/L (ref 38–126)
Anion gap: 4 — ABNORMAL LOW (ref 5–15)
BUN: 34 mg/dL — ABNORMAL HIGH (ref 6–20)
CO2: 23 mmol/L (ref 22–32)
Calcium: 7.7 mg/dL — ABNORMAL LOW (ref 8.9–10.3)
Chloride: 108 mmol/L (ref 98–111)
Creatinine: 1.01 mg/dL — ABNORMAL HIGH (ref 0.44–1.00)
GFR, Estimated: >60 mL/min (ref >60)
Glucose, Bld: 100 mg/dL — ABNORMAL HIGH (ref 70–99)
Potassium: 4.9 mmol/L (ref 3.5–5.1)
Sodium: 135 mmol/L (ref 135–145)
Total Bilirubin: 2 mg/dL — ABNORMAL HIGH (ref 0.3–1.2)
Total Protein: 5.7 g/dL — ABNORMAL LOW (ref 6.5–8.1)

## 2022-09-07 MED ORDER — HEPARIN SOD (PORK) LOCK FLUSH 100 UNIT/ML IV SOLN
500.0000 [IU] | Freq: Once | INTRAVENOUS | Status: AC | PRN
  Administered 2022-09-07: 500 [IU]

## 2022-09-07 MED ORDER — SODIUM CHLORIDE 0.9% FLUSH
10.0000 mL | Freq: Once | INTRAVENOUS | Status: AC | PRN
  Administered 2022-09-07: 10 mL

## 2022-09-07 MED ORDER — HEPARIN SOD (PORK) LOCK FLUSH 100 UNIT/ML IV SOLN: 500.0000 [IU] | Freq: Once | INTRAVENOUS | Status: DC | PRN

## 2022-09-07 MED ORDER — ONDANSETRON HCL 4 MG/2ML IJ SOLN
4.0000 mg | Freq: Once | INTRAMUSCULAR | Status: AC
  Administered 2022-09-07: 4 mg via INTRAVENOUS
  Filled 2022-09-07: qty 2

## 2022-09-07 MED ORDER — FAMOTIDINE IN NACL 20-0.9 MG/50ML-% IV SOLN
20.0000 mg | Freq: Once | INTRAVENOUS | Status: AC
  Administered 2022-09-07: 20 mg via INTRAVENOUS
  Filled 2022-09-07: qty 50

## 2022-09-07 MED ORDER — SODIUM CHLORIDE 0.9 % IV SOLN: Freq: Once | INTRAVENOUS | Status: AC

## 2022-09-07 NOTE — Patient Instructions (Signed)
Rehydration, Adult Rehydration is the replacement of body fluids, salts, and minerals (electrolytes) that are lost during dehydration. Dehydration is when there is not enough water or other fluids in the body. This happens when you lose more fluids than you take in. Common causes of dehydration include: Not drinking enough fluids. This can occur when you are ill or doing activities that require a lot of energy, especially in hot weather. Conditions that cause loss of water or other fluids, such as diarrhea, vomiting, sweating, or urinating a lot. Other illnesses, such as fever or infection. Certain medicines, such as those that remove excess fluid from the body (diuretics). Symptoms of mild or moderate dehydration may include thirst, dry lips and mouth, and dizziness. Symptoms of severe dehydration may include increased heart rate, confusion, fainting, and not urinating. For severe dehydration, you may need to get fluids through an IV at the hospital. For mild or moderate dehydration, you can usually rehydrate at home by drinking certain fluids as told by your health care provider. What are the risks? Generally, rehydration is safe. However, taking in too much fluid (overhydration) can be a problem. This is rare. Overhydration can cause an electrolyte imbalance, kidney failure, or a decrease in salt (sodium) levels in the body. Supplies needed You will need an oral rehydration solution (ORS) if your health care provider tells you to use one. This is a drink to treat dehydration. It can be found in pharmacies and retail stores. How to rehydrate Fluids Follow instructions from your health care provider for rehydration. The kind of fluid and the amount you should drink depend on your condition. In general, you should choose drinks that you prefer. If told by your health care provider, drink an ORS. Make an ORS by following instructions on the package. Start by drinking small amounts, about  cup (120  mL) every 5-10 minutes. Slowly increase how much you drink until you have taken the amount recommended by your health care provider. Drink enough clear fluids to keep your urine pale yellow. If you were told to drink an ORS, finish it first, then start slowly drinking other clear fluids. Drink fluids such as: Water. This includes sparkling water and flavored water. Drinking only water can lead to having too little sodium in your body (hyponatremia). Follow the advice of your health care provider. Water from ice chips you suck on. Fruit juice with water you add to it (diluted). Sports drinks. Hot or cold herbal teas. Broth-based soups. Milk or milk products. Food Follow instructions from your health care provider about what to eat while you rehydrate. Your health care provider may recommend that you slowly begin eating regular foods in small amounts. Eat foods that contain a healthy balance of electrolytes, such as bananas, oranges, potatoes, tomatoes, and spinach. Avoid foods that are greasy or contain a lot of sugar. In some cases, you may get nutrition through a feeding tube that is passed through your nose and into your stomach (nasogastric tube, or NG tube). This may be done if you have uncontrolled vomiting or diarrhea. Beverages to avoid  Certain beverages may make dehydration worse. While you rehydrate, avoid drinking alcohol. How to tell if you are recovering from dehydration You may be recovering from dehydration if: You are urinating more often than before you started rehydrating. Your urine is pale yellow. Your energy level improves. You vomit less frequently. You have diarrhea less frequently. Your appetite improves or returns to normal. You feel less dizzy or less light-headed.   Your skin tone and color start to look more normal. Follow these instructions at home: Take over-the-counter and prescription medicines only as told by your health care provider. Do not take sodium  tablets. Doing this can lead to having too much sodium in your body (hypernatremia). Contact a health care provider if: You continue to have symptoms of mild or moderate dehydration, such as: Thirst. Dry lips. Slightly dry mouth. Dizziness. Dark urine or less urine than normal. Muscle cramps. You continue to vomit or have diarrhea. Get help right away if you: Have symptoms of dehydration that get worse. Have a fever. Have a severe headache. Have been vomiting and the following happens: Your vomiting gets worse or does not go away. Your vomit includes blood or green matter (bile). You cannot eat or drink without vomiting. Have problems with urination or bowel movements, such as: Diarrhea that gets worse or does not go away. Blood in your stool (feces). This may cause stool to look black and tarry. Not urinating, or urinating only a small amount of very dark urine, within 6-8 hours. Have trouble breathing. Have symptoms that get worse with treatment. These symptoms may represent a serious problem that is an emergency. Do not wait to see if the symptoms will go away. Get medical help right away. Call your local emergency services (911 in the [place] Do not drive yourself to the hospital. Summary Rehydration is the replacement of body fluids and minerals (electrolytes) that are lost during dehydration. Follow instructions from your health care provider for rehydration. The kind of fluid and amount you should drink depend on your condition. Slowly increase how much you drink until you have taken the amount recommended by your health care provider. Contact your health care provider if you continue to show signs of mild or moderate dehydration. This information is not intended to replace advice given to you by your health care provider. Make sure you discuss any questions you have with your health care provider. Document Revised: 01/16/2020 Document Reviewed: 11/26/2019 Elsevier Patient  Education  2023 [employer]  

## 2022-09-08 ENCOUNTER — Other Ambulatory Visit: Payer: Self-pay

## 2022-09-08 ENCOUNTER — Inpatient Hospital Stay: Payer: BC Managed Care – PPO

## 2022-09-08 DIAGNOSIS — K219 Gastro-esophageal reflux disease without esophagitis: Secondary | ICD-10-CM | POA: Diagnosis not present

## 2022-09-08 DIAGNOSIS — Z79899 Other long term (current) drug therapy: Secondary | ICD-10-CM | POA: Diagnosis not present

## 2022-09-08 DIAGNOSIS — C50111 Malignant neoplasm of central portion of right female breast: Secondary | ICD-10-CM | POA: Diagnosis not present

## 2022-09-08 DIAGNOSIS — Z170 Estrogen receptor positive status [ER+]: Secondary | ICD-10-CM | POA: Diagnosis not present

## 2022-09-08 DIAGNOSIS — R161 Splenomegaly, not elsewhere classified: Secondary | ICD-10-CM | POA: Diagnosis not present

## 2022-09-08 DIAGNOSIS — D696 Thrombocytopenia, unspecified: Secondary | ICD-10-CM

## 2022-09-08 DIAGNOSIS — K7460 Unspecified cirrhosis of liver: Secondary | ICD-10-CM | POA: Diagnosis not present

## 2022-09-08 DIAGNOSIS — E871 Hypo-osmolality and hyponatremia: Secondary | ICD-10-CM | POA: Diagnosis not present

## 2022-09-08 DIAGNOSIS — Z5112 Encounter for antineoplastic immunotherapy: Secondary | ICD-10-CM | POA: Diagnosis not present

## 2022-09-08 DIAGNOSIS — C220 Liver cell carcinoma: Secondary | ICD-10-CM | POA: Diagnosis not present

## 2022-09-08 DIAGNOSIS — Z515 Encounter for palliative care: Secondary | ICD-10-CM | POA: Diagnosis not present

## 2022-09-08 DIAGNOSIS — Z79811 Long term (current) use of aromatase inhibitors: Secondary | ICD-10-CM | POA: Diagnosis not present

## 2022-09-08 DIAGNOSIS — R5383 Other fatigue: Secondary | ICD-10-CM | POA: Diagnosis not present

## 2022-09-08 DIAGNOSIS — F419 Anxiety disorder, unspecified: Secondary | ICD-10-CM | POA: Diagnosis not present

## 2022-09-08 DIAGNOSIS — J90 Pleural effusion, not elsewhere classified: Secondary | ICD-10-CM | POA: Diagnosis not present

## 2022-09-08 DIAGNOSIS — I959 Hypotension, unspecified: Secondary | ICD-10-CM | POA: Diagnosis not present

## 2022-09-08 DIAGNOSIS — D61818 Other pancytopenia: Secondary | ICD-10-CM | POA: Diagnosis not present

## 2022-09-08 LAB — ABO/RH: ABO/RH(D): A POS

## 2022-09-08 MED ORDER — SODIUM CHLORIDE 0.9% FLUSH: 3.0000 mL | INTRAVENOUS | Status: DC | PRN

## 2022-09-08 MED ORDER — SODIUM CHLORIDE 0.9% FLUSH
10.0000 mL | INTRAVENOUS | Status: AC | PRN
  Administered 2022-09-08: 10 mL

## 2022-09-08 MED ORDER — SODIUM CHLORIDE 0.9% IV SOLUTION
250.0000 mL | Freq: Once | INTRAVENOUS | Status: AC
  Administered 2022-09-08: 250 mL via INTRAVENOUS

## 2022-09-08 MED ORDER — DIPHENHYDRAMINE HCL 25 MG PO CAPS
25.0000 mg | ORAL_CAPSULE | Freq: Once | ORAL | Status: AC
  Administered 2022-09-08: 25 mg via ORAL
  Filled 2022-09-08: qty 1

## 2022-09-08 MED ORDER — HEPARIN SOD (PORK) LOCK FLUSH 100 UNIT/ML IV SOLN
500.0000 [IU] | Freq: Every day | INTRAVENOUS | Status: AC | PRN
  Administered 2022-09-08: 500 [IU]

## 2022-09-08 MED ORDER — HEPARIN SOD (PORK) LOCK FLUSH 100 UNIT/ML IV SOLN: 250.0000 [IU] | INTRAVENOUS | Status: DC | PRN

## 2022-09-08 NOTE — Patient Instructions (Signed)
Managing Low Blood Counts During Cancer Treatment Cancer treatments such as chemotherapy and radiation can sometimes cause a drop in the supply of blood cells in the body, including red blood cells, white blood cells, and platelets. These blood cells are produced in the body and are released into the blood to perform specific functions: Red blood cells carry gases such as oxygen and carbon dioxide to and from your lungs. White blood cells help protect you from infection. Platelets help your body form blood clots to prevent and control bleeding. When cancer treatments cause a drop in blood cell counts, your body may not have enough cells to keep up its normal functions. Symptoms or problems that may result will vary depending on which type of blood cells the treatment is affecting. If your blood counts are low, you can take steps to help manage any problems. How can low blood counts affect me? Low blood counts have various effects depending on the type of blood cells involved: If you have a low number of red blood cells, you have a condition called anemia. This can cause symptoms such as: Feeling tired and weak. Feeling light-headed. Being short of breath. If you have a low number of white blood cells, you may be at higher risk for infections. If you have a low number of platelets, you may bleed more easily, or your body may have trouble stopping any bleeding. You may also have more bruising. How to manage symptoms or prevent problems from a low blood count If you have a low blood count, you can take steps to manage symptoms or prevent problems that may develop. The steps to take will depend on which type of blood cell is low. Low red blood cells Take these steps to help manage the symptoms of anemia: Go for a walk or do some light exercise each day. Take short naps during the day. Eat foods that contain a lot of iron and protein. These include leafy green vegetables, meat and fish, beans, sweet  potatoes, and dried fruit such as prunes, raisins, and apricots. Ask for help with errands and with work that needs to be done around the house. You need to save your energy. Take vitamins or supplements--such as iron, vitamin B12, or folic acid--as told by your health care provider. Practice relaxation techniques, such as yoga or meditation.  Low white blood cells Take these steps to help prevent infections: Keep your body clean. Make sure you: Wash your hands often with warm, soapy water. If you get a scrape or cut, clean it thoroughly right away. Take care when cleaning yourself after using the bathroom. Tell your health care provider if you have any rectal sores or bleeding. Avoid contact with pet waste. Wash your hands after handling pets. Do not swim or wade in lakes, ponds, rivers, water parks, or hot tubs. Avoid crowds of people and any person who has the flu or a fever. To protect yourself, you may be instructed to wear a mask whenever you are around other people. Avoid dental work. Check your mouth each day for sores or signs of infection. Do not share utensils. Avoid fresh flowers and plants or dried flowers. Follow food safety guidelines. Cook meat thoroughly and wash all raw fruits and vegetables.  Low platelets Take these steps to help prevent or control bleeding and bruising: Use an electric razor for shaving instead of a blade. Use a soft toothbrush and be careful when cleaning your mouth and teeth. Talk with your cancer   care team about whether you should avoid flossing. If your mouth is bleeding, rinse it with ice water. Avoid activities that could cause injury, such as contact sports. Talk with your health care provider about using stool softeners to avoid constipation and straining during bowel movements. Do not use medicines such as ibuprofen, aspirin, or naproxen unless your health care provider tells you to. Limit alcohol use. If you drink alcohol: Limit how much you  use to: 0-1 drink a day for women. 0-2 drinks a day for men. Be aware of how much alcohol is in your drink. In the [place] one drink equals one 12 oz bottle of beer (355 mL), one 5 oz glass of wine (148 mL), or one 1 oz glass of hard liquor (44 mL). Monitor any bleeding closely. If you start bleeding, hold pressure on the area for 5 minutes to stop the bleeding. Bleeding that does not stop is considered an emergency. What treatments can help increase a low blood count? If needed, your health care provider may recommend treatment for a low blood count. Treatment will depend on the type of blood cell that is low and the severity of your condition. Treatment options may include: Taking medicines to help stimulate the growth of blood cells. This is an option for treating a low red blood cell count. Your health care provider may also recommend that you take iron, folic acid, or vitamin B12 supplements. Making dietary changes. Including more iron and protein in your diet can help stimulate the growth of red blood cells. Adjusting your current medicines to help raise blood counts. Making changes to your treatment plan. Having a blood transfusion. This may be done if your blood count is very low. Where to find more information American [employer] Society: cancer.org Contact a health care provider if you: Feel extremely tired and weak. Have more bruising or bleeding. Feel ill or develop a cough. Have swelling or redness. Have mouth sores or a sore throat. Have painful urination or have blood in your urine or stool. Have abdominal pain or diarrhea. Are thinking of taking any new supplements or vitamins or making dietary changes. Get help right away if you: Are short of breath, have chest pain, or feel dizzy. Have a fever or chills. Have bleeding that will not stop. Summary Cancer treatments such as chemotherapy and radiation can sometimes cause a drop in the supply of blood cells in the body, including  red blood cells, white blood cells, and platelets. If you have a low blood count, you can take steps to manage symptoms or prevent problems that may develop. Depending on which type of blood cell is low, you may need to take steps to prevent infection, prevent bleeding, or manage symptoms that may develop. Any treatment will depend on the type of blood cell that is low and the severity of your condition. Contact a health care provider if you feel extremely tired and weak. This information is not intended to replace advice given to you by your health care provider. Make sure you discuss any questions you have with your health care provider. Document Revised: 03/24/2022 Document Reviewed: 08/03/2019 [employer] Patient Education  2023 [employer]  

## 2022-09-09 ENCOUNTER — Encounter: Payer: Self-pay | Admitting: Hematology

## 2022-09-09 ENCOUNTER — Other Ambulatory Visit: Payer: Self-pay

## 2022-09-09 DIAGNOSIS — C220 Liver cell carcinoma: Secondary | ICD-10-CM

## 2022-09-09 DIAGNOSIS — D696 Thrombocytopenia, unspecified: Secondary | ICD-10-CM

## 2022-09-09 DIAGNOSIS — D649 Anemia, unspecified: Secondary | ICD-10-CM

## 2022-09-09 DIAGNOSIS — C50111 Malignant neoplasm of central portion of right female breast: Secondary | ICD-10-CM

## 2022-09-09 LAB — BPAM PLATELET PHERESIS
Blood Product Expiration Date: 202310122359
ISSUE DATE / TIME: 202310110947
Unit Type and Rh: 6200

## 2022-09-09 LAB — PREPARE PLATELET PHERESIS: Unit division: 0

## 2022-09-09 NOTE — Progress Notes (Signed)
[place]   [phone] [phone]   Clinic Follow up Note   Patient Care Team: Seward Carol, MD as PCP - General (Internal Medicine) Arna Snipe, RN as Oncology Nurse Navigator Irene Shipper, MD as Consulting Physician (Gastroenterology) Truitt Merle, MD as Consulting Physician (Hematology) Alla Feeling, NP as Nurse Practitioner (Nurse Practitioner) Stark Klein, MD as Consulting Physician (General Surgery) Aletta Edouard, MD as Consulting Physician (Interventional Radiology) Roosevelt Locks, CRNP as Nurse Practitioner (Nurse Practitioner) Mauro Kaufmann, RN as Oncology Nurse Navigator Rockwell Germany, RN as Oncology Nurse Navigator Eppie Gibson, MD as Attending Physician (Radiation Oncology) Trula Slade, DPM as Consulting Physician (Podiatry)  Date of Service:  09/07/2022  I connected with Theresa Rocha on 09/07/2022 at  4:30 PM EDT by telephone and verified that I am speaking with the correct person using two identifiers.   I discussed the limitations, risks, security and privacy concerns of performing an evaluation and management service by telephone and the availability of in person appointments. I also discussed with the patient that there may be a patient responsible charge related to this service. The patient expressed understanding and agreed to proceed.   Patient's location:  car Provider's location:  office   CHIEF COMPLAINT: fatigue and acid reflex   CURRENT THERAPY:  -Tecentriq/Bevacizumab, q21d, starting 06/29/22 -Anastrozole, starting 08/16/22 Theresa Rocha, days 1-21 q28d, starting 08/24/22  ASSESSMENT & PLAN:  Theresa Rocha is a 56 y.o. female with   1. Multifocal Hepatocellular carcinoma, cT2N0Mx, stage II -startedTecentriq and bevacizumab on 06/29/22, she has been tolerating therapy well. Her overall clinical condition has slightly improved, but not much better     2. 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    3. GERD -He has had worsening acid reflux since she started Ibrance -Continue Protonix, she will try Pepcid and Tums also  4. Fatigue -she reports continued fatigue, likely multifactorial. -I encouraged her to try melatonin first.   5.  Pancytopenia -Secondary to liver cirrhosis and splenomegaly, worse since she started Ibrance -Platelet 16 today, no active bleeding.  We will give 1 unit platelet tomorrow   PLAN:  -She received IV fluids today -1 unit platelet tomorrow -Lab weekly   No problem-specific Assessment & Plan notes found for this encounter.   SUMMARY OF ONCOLOGIC HISTORY: Oncology History Overview Note  Cancer Staging Hepatocellular carcinoma [facility]) Staging form: Liver, AJCC 8th Edition - Clinical stage from 08/02/2019: Stage II (cT2, cN0, cM0) - Signed by Truitt Merle, MD on 08/02/2019  Malignant neoplasm of central portion of right breast in female, estrogen receptor positive ([place]) Staging form: Breast, AJCC 8th Edition - Clinical stage from 08/29/2019: Stage IB (cT2, cN0, cM0, G2, ER+, PR+, HER2-) - Signed by Truitt Merle, MD on 09/05/2019    Hepatocellular carcinoma ([place])  07/04/2019 Imaging   CT AP IMPRESSION: 1. Findings of cirrhosis and hepatomegaly.  Patent portal vein. 2. Numerous varicosities as described above. 3. Moderate to large amount of abdominopelvic ascites 4. Contrast filled appendix which appears to be a mildly dilated with apparent wall thickening which could be due to surrounding ascites.   07/09/2019 Imaging   ABD US IMPRESSION: 1. Three small hypoechoic lesions in the RIGHT hepatic lobe measuring less than 1 cm but not evident on comparison contrast enhanced CT from 07/04/2019. Recommend MRI of the abdomen  without and with contrast to evaluate these hepatic lesions in cirrhotic patient. 2. Increased  liver echogenicity and lobular contour consistent cirrhosis. 3. Moderate mild to moderate volume of ascites. 4. Mild gallbladder wall thickening likely related to ascites. 5. No cholelithiasis or cholecystitis evident   07/26/2019 Imaging   MR ABD IMPRESSION: 1. In the lateral segment left hepatic lobe there are two LI-RADS category LR-5 lesions representing hepatocellular carcinoma. 2. There is also a LI-RADS category LR-3 lesion peripherally in the right hepatic lobe, intermediate probability for hepatocellular carcinoma. 3. Hepatic cirrhosis and diffuse wall thickening of the gallbladder. Widespread steatosis in the liver. 4. 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 5.  Aortic Atherosclerosis (ICD10-I70.0). 6. Widespread ascites and mesenteric edema.   07/26/2019 Tumor Marker   AFP 5.5 (baseline)   08/02/2019 Initial Diagnosis   Hepatocellular carcinoma ([place])   08/02/2019 Cancer Staging   Staging form: Liver, AJCC 8th Edition - Clinical stage from 08/02/2019: Stage II (cT2, cN0, cM0) - Signed by Truitt Merle, MD on 08/02/2019   08/07/2019 Imaging   CT Chest 08/07/19 IMPRESSION: 1. Tiny bilateral pulmonary nodules measuring up to 6 mm. These are too small to definitively characterize. Given the history of HCC, close follow-up recommended to ensure stability. 2.  Emphysema. (ICD10-J43.9)   08/09/2019 Procedure   Upper Endoscopy by Dr Henrene Pastor 08/09/19  IMPRESSION 1. Small esophageal varices 2. Mild diffuse portal hypertensive gastropathy 3. Otherwise normal EGD.   02/04/2020 Imaging   MRI abdomen  IMPRESSION: Stable exam. The small segment II lesions identified previously as LI-RADS category 5 are stable in size.   Previously characterized tiny LI-RADS 3 lesion in the inferior right liver is stable.   No new suspicious focal hepatic lesion on a background liver morphology consistent with cirrhosis.   Interval decrease in ascites with decrease and diffuse  gallbladder wall thickening.     06/18/2020 Imaging   MRI Abdomen  IMPRESSION: 1. Stable 1.1 x 0.9 cm rounded lesion in the medial left lobe of the liver, hepatic segment II, demonstrating arterial phase hyperenhancement without washout or capsule. This has previously been described as a LI-RADS category 5 lesion based on prior observations, if categorized by current imaging features this is consistent with LI-RADS category 4. 2. Stable 1.5 x 1.2 cm lesion lateral and inferior to this lesion in hepatic segment II with very subtle evidence of washout but without capsular enhancement. LI-RADS category 5. 3. Stable 5 mm focus of early arterial phase hyperenhancement of the inferior right lobe of the liver, hepatic segment VI, without evidence of washout or capsule. This remains consistent with LI-RADS category 3. 4. No new suspicious lesions or contrast enhancement. 5. Cirrhotic morphology of the liver. 6. Trace perihepatic ascites.     10/30/2021 Imaging   EXAM: MRI ABDOMEN WITHOUT AND WITH CONTRAST  IMPRESSION: 1. Ablation defect of the left lobe of the liver which subtends two lesions previously noted in hepatic segment II. No evidence of residual contrast enhancement to suggest viable tumor.  2. Significant interval increase in size of a hyperenhancing lesion of the posterior liver dome, hepatic segment VII, measuring 4.0 x 3.7 cm, previously 2.6 x 2.5 cm when measured similarly. This demonstrates some heterogeneous contrast enhancement although no clear evidence of washout or capsular enhancement. Findings remain LI-RADS category 5, consistent with hepatocellular carcinoma. 3. Slight interval increase in size of a hyperenhancing lesion of the inferior tip of the right lobe of the liver, hepatic segment VI, measuring 1.1 x 0.7 cm,  previously no greater than 0.6 cm, as well as an additional adjacent lesion measuring 0.6 cm, previously 0.4 cm. Given threshold growth, these lesions are  upstaged to LI-RADS category 4 and suspicious for additional small foci of hepatocellular carcinoma. 4. Cirrhotic morphology of the liver and hepatic steatosis. 5. Small volume ascites throughout the abdomen. 6. Large left, small right pleural effusions and associated atelectasis or consolidation. 7. Layering sludge in the gallbladder with mild gallbladder wall thickening and pericholecystic fluid, similar to prior examination although nonspecific in the setting of ascites. No discrete gallstones.   11/10/2021 Imaging   EXAM: CT ABDOMEN WITHOUT AND WITH CONTRAST  IMPRESSION: Post treatment changes about the LEFT hepatic lobe lesion with variable arterial enhancement not showing washout and with non masslike features, favor perfusional defect, characterized as TR equivocal but favor nonviable.   Lesion in the posterior RIGHT hepatic lobe over time with enlargement and with serpiginous internal low signal on MR and low attenuation on CT. Findings categorized as LR M given signs of rim like enhancement on previous imaging and infiltrative appearance. Potentially infiltrative HCC but without specific imaging features of hepatocellular carcinoma by LI-RADS.   Enlarging lesion in the inferior RIGHT hepatic lobe greater than 50% diameter increase over 6 month interval, LI-RADS category 5 showing non rim arterial phase enhancement.   Additional foci of arterial phase enhancement without definitive washout, categorized as LI-RADS category 3 but in aggregate are more suspicious for small foci of hepatocellular carcinoma, attention on follow-up.   11/19/2021 Pathology Results   A. PLEURAL FLUID, LEFT, THORACENTESIS:   FINAL MICROSCOPIC DIAGNOSIS:  - No malignant cells identified  - Reactive mesothelial cells present    12/15/2021 PET scan   IMPRESSION: 1. Intensely hypermetabolic mass in the posterior RIGHT hepatic lobe consistent with known hepatocellular carcinoma. Smaller lesions identified on  MRI are not hypermetabolic. 2. No evidence of metastatic lymphadenopathy or distant metastatic disease. 3. Elongated nodular thickening in the lingula is favored benign. Moderate LEFT pleural effusion.   06/29/2022 - 07/19/2022 Chemotherapy   Patient is on Treatment Plan : LUNG Atezolizumab + Bevacizumab q21d Maintenance     07/09/2022 -  Chemotherapy   Patient is on Treatment Plan : LUNG Atezolizumab + Bevacizumab Maintenance q21d     Malignant neoplasm of central portion of right breast in female, estrogen receptor positive ([place])  08/29/2019 Cancer Staging   Staging form: Breast, AJCC 8th Edition - Clinical stage from 08/29/2019: Stage IB (cT2, cN0, cM0, G2, ER+, PR+, HER2-) - Signed by Truitt Merle, MD on 09/05/2019   08/29/2019 Mammogram   Diagnostic mammogram 08/29/19 IMPRESSION: Suspicious mass in the 12 o'clock region of the right breast 3 cm from the nipple measuring 3.2 x 1.6 x 2.6 cm   08/29/2019 Initial Biopsy   Diagnosis 08/29/19 Breast, right, needle core biopsy, 12 o'clock - INVASIVE DUCTAL CARCINOMA, SEE COMMENT.   08/29/2019 Receptors her2   Results: IMMUNOHISTOCHEMICAL AND MORPHOMETRIC ANALYSIS PERFORMED MANUALLY The tumor cells are NEGATIVE for Her2 (1+). Estrogen Receptor: 100%, POSITIVE, STRONG STAINING INTENSITY Progesterone Receptor: 90%, POSITIVE, STRONG STAINING INTENSITY Proliferation Marker Ki67: 15%   09/05/2019 Initial Diagnosis   Malignant neoplasm of central portion of right breast in female, estrogen receptor positive ([place])   09/13/2019 Breast MRI   MRI breast 09/13/19 IMPRESSION: 1. Irregular spiculated enhancing mass in the right breast representing the patient's known malignancy. No abnormal right axillary lymph nodes. 2. Prominent left internal mammary lymph node is indeterminate. No abnormal appearing right internal mammary lymph  nodes. 3. No evidence of malignancy in the left breast.   10/16/2019 Cancer Staging   Staging form: Breast, AJCC 8th  Edition - Pathologic stage from 10/16/2019: Stage IA (pT2, pN0, cM0, G2, ER+, PR+, HER2-, Oncotype DX score: 21) - Signed by Gardenia Phlegm, NP on 11/07/2019   10/16/2019 Oncotype testing   Oncotype 10/16/19 Recurrence score 21 with 7% benefit of Tamoxifen or AI. There is less than 1 % benefit of chemo.    10/16/2019 Surgery   RIGHT BREAST LUMPECTOMY WITH SENTINEL LYMPH NODE BIOPSY by Dr Barry Dienes 10/16/19    10/16/2019 Pathology Results   FINAL MICROSCOPIC DIAGNOSIS: 10/16/19  A. BREAST, RIGHT, LUMPECTOMY:  - Invasive ductal carcinoma, 2.3 cm, Nottingham grade 2 of 3.  - Margins of resection are not involved (Closest margin: 1 mm,  anterior).  - See oncology table.   B. SENTINEL LYMPH NODE, RIGHT AXILLARY #1, BIOPSY:  - One lymph node, negative for carcinoma (0/1).   C. SENTINEL LYMPH NODE, RIGHT AXILLARY #2, BIOPSY:  - One lymph node, negative for carcinoma (0/1).    09/2019 -  Anti-estrogen oral therapy   Anastrozole 31m daily started in Nov 2020. Due to worsened knee pain/arthritis, she was switched to Tamoxifen in 03/2020.    12/03/2019 - 12/31/2019 Radiation Therapy   Adjuvant RT with Dr SIsidore Moos1/4/21-12/31/19   04/01/2020 Survivorship   SCP delivered by virtual visit per LCira Rue NP       INTERVAL HISTORY:  Theresa SAARIcame in for IV fluids as scheduled.  I called her after she left the infusion due to her abnormal lab and multiple concerns.  She reports worsening fatigue, and acid reflux since she started Ibrance.  We have told her to stop Ibrance due to the fatigue.    All other systems were reviewed with the patient and are negative.  MEDICAL HISTORY:  Past Medical History:  Diagnosis Date   Anemia    Breast cancer ([place] 08/30/2019   Cancer ([place]    liver- just diagnosed, not started treatment yet   Cervicalgia    COPD (chronic obstructive pulmonary disease) (HCC)    Esophageal varices (HCC)    GERD (gastroesophageal reflux disease)    Headache     Hepatic cirrhosis (HCC)    Hepatitis C    resolved 2018   Hepatocellular carcinoma (HCC)    IBS (irritable bowel syndrome)    Lactose intolerance    Personal history of radiation therapy 11/2019   Right breast   Portal hypertension ([facility]    Raynaud's syndrome    Thrombocytopenia ([place]     SURGICAL HISTORY: Past Surgical History:  Procedure Laterality Date   BREAST BIOPSY Right 09/2019   BREAST LUMPECTOMY Right 09/2019   BREAST LUMPECTOMY WITH AXILLARY LYMPH NODE BIOPSY Right 10/16/2019   Procedure: RIGHT BREAST LUMPECTOMY WITH SENTINEL LYMPH NODE BIOPSY;  Surgeon: BStark Klein MD;  [facility]  Service: General;  Laterality: Right;   COLONOSCOPY     ESOPHAGEAL BANDING  10/27/2021   Procedure: ESOPHAGEAL BANDING;  Surgeon: PIrene Shipper MD;  [facility];  Service: Endoscopy;;   ESOPHAGOGASTRODUODENOSCOPY (EGD) WITH PROPOFOL N/A 10/27/2021   Procedure: ESOPHAGOGASTRODUODENOSCOPY (EGD) WITH PROPOFOL;  Surgeon: PIrene Shipper MD;  [facility];  Service: Endoscopy;  Laterality: N/A;   ESOPHAGOGASTRODUODENOSCOPY (EGD) WITH PROPOFOL N/A 01/04/2022   Procedure: ESOPHAGOGASTRODUODENOSCOPY (EGD) WITH PROPOFOL;  Surgeon: PIrene Shipper MD;  [facility];  Service: Endoscopy;  Laterality: N/A;   HAMMER  TOE SURGERY Left 2012   pins placed in great toe, 2nd,3rd, 4th toes, all pins removed except great toe   IR ANGIOGRAM SELECTIVE EACH ADDITIONAL VESSEL  12/16/2021   IR ANGIOGRAM SELECTIVE EACH ADDITIONAL VESSEL  12/16/2021   IR ANGIOGRAM SELECTIVE EACH ADDITIONAL VESSEL  12/22/2021   IR ANGIOGRAM VISCERAL SELECTIVE  12/16/2021   IR ANGIOGRAM VISCERAL SELECTIVE  12/22/2021   IR EMBO TUMOR ORGAN ISCHEMIA INFARCT INC GUIDE ROADMAPPING  12/22/2021   IR IMAGING GUIDED PORT INSERTION  07/07/2022   IR PARACENTESIS  11/13/2019   IR PARACENTESIS  03/04/2022   IR PATIENT EVAL TECH 0-60 MINS  05/19/2022   IR PERC PLEURAL DRAIN W/INDWELL CATH W/IMG GUIDE  03/09/2022   IR PERC  PLEURAL DRAIN W/INDWELL CATH W/IMG GUIDE  09/01/2022   IR RADIOLOGIST EVAL & MGMT  08/16/2019   IR RADIOLOGIST EVAL & MGMT  02/06/2020   IR RADIOLOGIST EVAL & MGMT  06/26/2020   IR RADIOLOGIST EVAL & MGMT  12/31/2020   IR RADIOLOGIST EVAL & MGMT  07/21/2021   IR RADIOLOGIST EVAL & MGMT  10/08/2021   IR RADIOLOGIST EVAL & MGMT  01/13/2022   IR RADIOLOGIST EVAL & MGMT  03/05/2022   IR RADIOLOGIST EVAL & MGMT  09/06/2022   IR THORACENTESIS ASP PLEURAL SPACE W/IMG GUIDE  10/12/2021   IR THORACENTESIS ASP PLEURAL SPACE W/IMG GUIDE  10/26/2021   IR THORACENTESIS ASP PLEURAL SPACE W/IMG GUIDE  11/19/2021   IR THORACENTESIS ASP PLEURAL SPACE W/IMG GUIDE  12/22/2021   IR US GUIDE VASC ACCESS RIGHT  12/16/2021   IR US GUIDE VASC ACCESS RIGHT  12/22/2021   PELVIC LAPAROSCOPY Bilateral 1992   RADIOLOGY WITH ANESTHESIA N/A 09/16/2021   Procedure: CT MICROWAVE ABLATION;  Surgeon: Aletta Edouard, MD;  Location: [facility];  Service: Radiology;  Laterality: N/A;   UPPER GASTROINTESTINAL ENDOSCOPY  2002   had esophageal dilitation    I have reviewed the social history and family history with the patient and they are unchanged from previous note.  ALLERGIES:  is allergic to hydromorphone hcl and aspirin.  MEDICATIONS:  Current Outpatient Medications  Medication Sig Dispense Refill   anastrozole (ARIMIDEX) 1 MG tablet Take 1 tablet (1 mg total) by mouth daily. 30 tablet 2   baclofen (LIORESAL) 10 MG tablet Take 5 mg by mouth daily as needed for muscle spasms.     dicyclomine (BENTYL) 20 MG tablet Take 0.5 tablets (10 mg total) by mouth every 6 (six) hours as needed for spasms. Take one tablet every 6-8 hours as needed for abdominal cramping (Patient taking differently: Take 20 mg by mouth daily as needed (abdominal cramping).) 90 tablet 0   guaiFENesin-codeine 100-10 MG/5ML syrup Take 5 mLs by mouth every 6 (six) hours as needed for cough. 120 mL 0   IBRANCE 125 MG tablet TAKE 1 TABLET DAILY FOR 21 DAYS ON, THEN 7  DAYS OFF. REPEAT EVERY 28 DAYS (Patient not taking: Reported on 09/08/2022) 21 tablet 0   lidocaine-prilocaine (EMLA) cream Apply 1 Application topically as needed. 30 g 2   nicotine (NICODERM CQ - DOSED IN MG/24 HOURS) 21 mg/24hr patch Place 21 mg onto the skin daily as needed (smoking cessation on work days).     oxyCODONE (OXY IR/ROXICODONE) 5 MG immediate release tablet Take 1 tablet (5 mg total) by mouth every 6 (six) hours as needed for severe pain. 60 tablet 0   pantoprazole (PROTONIX) 40 MG tablet TAKE 1 TABLET BY MOUTH EVERY DAY 90  tablet 1   Prenatal Vit-Fe Fumarate-FA (PRENATAL PO) Take 1 tablet by mouth daily.     promethazine (PHENERGAN) 12.5 MG tablet Take 1 tablet (12.5 mg total) by mouth daily as needed for nausea or vomiting. 20 tablet 3   No current facility-administered medications for this visit.    PHYSICAL EXAMINATION: ECOG PERFORMANCE STATUS: 2 - Symptomatic, <50% confined to bed  There were no vitals filed for this visit.  Wt Readings from Last 3 Encounters:  09/01/22 34 lb 1 oz (15.5 kg)  08/30/22 87 lb 3.2 oz (39.6 kg)  08/09/22 83 lb 12.8 oz (38 kg)     GENERAL:alert, no distress and comfortable SKIN: skin color normal, no rashes or significant lesions EYES: normal, Conjunctiva are pink and non-injected, sclera clear  NEURO: alert & oriented x 3 with fluent speech  LABORATORY DATA:  I have reviewed the data as listed    Latest Ref Rng & Units 09/07/2022    2:40 PM 09/01/2022    9:00 AM 08/30/2022    9:52 AM  CBC  WBC 4.0 - 10.5 K/uL 2.2  6.4  5.7   Hemoglobin 12.0 - 15.0 g/dL 9.9  10.1  9.7   Hematocrit 36.0 - 46.0 % 30.4  32.9  30.8   Platelets 150 - 400 K/uL 16  80  92         Latest Ref Rng & Units 09/07/2022    2:40 PM 09/01/2022    9:00 AM 08/30/2022    9:52 AM  CMP  Glucose 70 - 99 mg/dL 100  111  129   BUN 6 - 20 mg/dL 34  30  27   Creatinine 0.44 - 1.00 mg/dL 1.01  1.01  0.91   Sodium 135 - 145 mmol/L 135  128  127   Potassium 3.5 -  5.1 mmol/L 4.9  5.1  5.2   Chloride 98 - 111 mmol/L 108  104  103   CO2 22 - 32 mmol/L _0 Calcium 8.9 - 10.3 mg/dL 7.7  7.7  7.7   Total Protein 6.5 - 8.1 g/dL 5.7   5.1   Total Bilirubin 0.3 - 1.2 mg/dL 2.0   1.5   Alkaline Phos 38 - 126 U/L 67   87   AST 15 - 41 U/L 28   31   ALT 0 - 44 U/L 15   19       RADIOGRAPHIC STUDIES: I have personally reviewed the radiological images as listed and agreed with the findings in the report. No results found.    I discussed the assessment and treatment plan with the patient. The patient was provided an opportunity to ask questions and all were answered. The patient agreed with the plan and demonstrated an understanding of the instructions.   The patient was advised to call back or seek an in-person evaluation if the symptoms worsen or if the condition fails to improve as anticipated.  I provided 15 minutes of non face-to-face telephone visit time during this encounter, and > 50% was spent counseling as documented under my assessment & plan.      Truitt Merle, MD 09/07/2022

## 2022-09-13 ENCOUNTER — Telehealth: Payer: Self-pay

## 2022-09-13 ENCOUNTER — Other Ambulatory Visit (HOSPITAL_COMMUNITY): Payer: Self-pay | Admitting: Interventional Radiology

## 2022-09-13 ENCOUNTER — Other Ambulatory Visit: Payer: Self-pay

## 2022-09-13 ENCOUNTER — Other Ambulatory Visit: Payer: Self-pay | Admitting: Physician Assistant

## 2022-09-13 ENCOUNTER — Ambulatory Visit (HOSPITAL_COMMUNITY)
Admission: RE | Admit: 2022-09-13 | Discharge: 2022-09-13 | Disposition: A | Discharge status: Home / Self Care | Payer: BC Managed Care – PPO | Source: Ambulatory Visit | Attending: Interventional Radiology | Admitting: Interventional Radiology

## 2022-09-13 DIAGNOSIS — R0602 Shortness of breath: Secondary | ICD-10-CM | POA: Diagnosis not present

## 2022-09-13 DIAGNOSIS — R058 Other specified cough: Secondary | ICD-10-CM | POA: Insufficient documentation

## 2022-09-13 DIAGNOSIS — J90 Pleural effusion, not elsewhere classified: Secondary | ICD-10-CM | POA: Diagnosis not present

## 2022-09-13 DIAGNOSIS — R180 Malignant ascites: Secondary | ICD-10-CM | POA: Diagnosis not present

## 2022-09-13 DIAGNOSIS — C220 Liver cell carcinoma: Secondary | ICD-10-CM

## 2022-09-13 DIAGNOSIS — C50919 Malignant neoplasm of unspecified site of unspecified female breast: Secondary | ICD-10-CM | POA: Diagnosis not present

## 2022-09-13 DIAGNOSIS — R059 Cough, unspecified: Secondary | ICD-10-CM | POA: Diagnosis not present

## 2022-09-13 NOTE — Telephone Encounter (Signed)
Pt showed up at the [facility] stating that she just completed a CXR that Dr. Kathlene Cote ordered per pt's request d/t increased cough and SOB.  Pt requesting if CXR could be read and so the pt would not have to go all the way home and then have to come back to the [place] d/t CXR results.  Pt stated she feels as if she has pneumonia but denies fevers.  Pt stated she and spouse will wait at the [facility] until pt's CXR results.  This RN contacted [facility] to expedite the CXR read.  Reviewed results and notified Cira Rue, NP.  Lacie asked if pt could be seen by [facility] regarding CXR results.  Spoke with Eloise Harman, PA-C for [facility] regarding results.  Since the pt has new pleural effusion in the Rt Lung, Anda Kraft felt that the pt was not a candidate for [facility] since the CXR show possible signs of disease progression.  Notified Lacie of Kate's suggestion.  Sent a Secure Chat message to Dr. Kathlene Cote with [facility] included asking if pt could get a Rt thoracentesis since pt is symptomatic and if Dr. Kathlene Cote could review the CXR.  Dr. Kathlene Cote agreed and placed orders for Rt Thoracentesis w/Cytology scheduled on 09/14/2022.  Pt notified of plan.  Pt agreed and had no further questions or concerns.  Pt scheduled to also come to the [facility] on 09/14/2022 for labs, IVF (which Lacie agreed to hold for now), and possible blood transfusion.  Pt requested a urine protein test if possible tomorrow.  Notified Lacie of the pt's request and Lacie agreed with pt's request.  Verbal order given and urine protein test ordered.  Pt confirmed that she's not taking the Ibrance as instructed by [name].

## 2022-09-14 ENCOUNTER — Other Ambulatory Visit: Payer: Self-pay

## 2022-09-14 ENCOUNTER — Inpatient Hospital Stay: Payer: BC Managed Care – PPO

## 2022-09-14 ENCOUNTER — Ambulatory Visit (HOSPITAL_COMMUNITY)
Admission: RE | Admit: 2022-09-14 | Discharge: 2022-09-14 | Disposition: A | Discharge status: Home / Self Care | Payer: BC Managed Care – PPO | Source: Ambulatory Visit | Attending: Physician Assistant | Admitting: Physician Assistant

## 2022-09-14 ENCOUNTER — Ambulatory Visit (HOSPITAL_COMMUNITY)
Admission: RE | Admit: 2022-09-14 | Discharge: 2022-09-14 | Disposition: A | Discharge status: Home / Self Care | Payer: BC Managed Care – PPO | Source: Ambulatory Visit | Attending: Interventional Radiology | Admitting: Interventional Radiology

## 2022-09-14 ENCOUNTER — Encounter (HOSPITAL_COMMUNITY): Payer: Self-pay

## 2022-09-14 VITALS — BP 90/64

## 2022-09-14 DIAGNOSIS — Z79811 Long term (current) use of aromatase inhibitors: Secondary | ICD-10-CM | POA: Diagnosis not present

## 2022-09-14 DIAGNOSIS — C50111 Malignant neoplasm of central portion of right female breast: Secondary | ICD-10-CM

## 2022-09-14 DIAGNOSIS — I959 Hypotension, unspecified: Secondary | ICD-10-CM | POA: Diagnosis not present

## 2022-09-14 DIAGNOSIS — C220 Liver cell carcinoma: Secondary | ICD-10-CM

## 2022-09-14 DIAGNOSIS — R846 Abnormal cytological findings in specimens from respiratory organs and thorax: Secondary | ICD-10-CM | POA: Diagnosis not present

## 2022-09-14 DIAGNOSIS — R161 Splenomegaly, not elsewhere classified: Secondary | ICD-10-CM | POA: Diagnosis not present

## 2022-09-14 DIAGNOSIS — Z5112 Encounter for antineoplastic immunotherapy: Secondary | ICD-10-CM | POA: Diagnosis not present

## 2022-09-14 DIAGNOSIS — D61818 Other pancytopenia: Secondary | ICD-10-CM | POA: Diagnosis not present

## 2022-09-14 DIAGNOSIS — D649 Anemia, unspecified: Secondary | ICD-10-CM

## 2022-09-14 DIAGNOSIS — Z170 Estrogen receptor positive status [ER+]: Secondary | ICD-10-CM | POA: Diagnosis not present

## 2022-09-14 DIAGNOSIS — R5383 Other fatigue: Secondary | ICD-10-CM | POA: Diagnosis not present

## 2022-09-14 DIAGNOSIS — Z9889 Other specified postprocedural states: Secondary | ICD-10-CM

## 2022-09-14 DIAGNOSIS — Z79899 Other long term (current) drug therapy: Secondary | ICD-10-CM | POA: Diagnosis not present

## 2022-09-14 DIAGNOSIS — D696 Thrombocytopenia, unspecified: Secondary | ICD-10-CM

## 2022-09-14 DIAGNOSIS — K7460 Unspecified cirrhosis of liver: Secondary | ICD-10-CM | POA: Diagnosis not present

## 2022-09-14 DIAGNOSIS — J90 Pleural effusion, not elsewhere classified: Secondary | ICD-10-CM | POA: Insufficient documentation

## 2022-09-14 DIAGNOSIS — E871 Hypo-osmolality and hyponatremia: Secondary | ICD-10-CM | POA: Diagnosis not present

## 2022-09-14 DIAGNOSIS — K219 Gastro-esophageal reflux disease without esophagitis: Secondary | ICD-10-CM | POA: Diagnosis not present

## 2022-09-14 DIAGNOSIS — R091 Pleurisy: Secondary | ICD-10-CM | POA: Diagnosis not present

## 2022-09-14 DIAGNOSIS — Z515 Encounter for palliative care: Secondary | ICD-10-CM | POA: Diagnosis not present

## 2022-09-14 DIAGNOSIS — F419 Anxiety disorder, unspecified: Secondary | ICD-10-CM | POA: Diagnosis not present

## 2022-09-14 LAB — CMP (CANCER CENTER ONLY)
ALT: 15 U/L (ref 0–44)
AST: 29 U/L (ref 15–41)
Albumin: 2.5 g/dL — ABNORMAL LOW (ref 3.5–5.0)
Alkaline Phosphatase: 66 U/L (ref 38–126)
Anion gap: 4 — ABNORMAL LOW (ref 5–15)
BUN: 21 mg/dL — ABNORMAL HIGH (ref 6–20)
CO2: 24 mmol/L (ref 22–32)
Calcium: 7.7 mg/dL — ABNORMAL LOW (ref 8.9–10.3)
Chloride: 101 mmol/L (ref 98–111)
Creatinine: 0.62 mg/dL (ref 0.44–1.00)
GFR, Estimated: >60 mL/min (ref >60)
Glucose, Bld: 143 mg/dL — ABNORMAL HIGH (ref 70–99)
Potassium: 4.1 mmol/L (ref 3.5–5.1)
Sodium: 129 mmol/L — ABNORMAL LOW (ref 135–145)
Total Bilirubin: 2.4 mg/dL — ABNORMAL HIGH (ref 0.3–1.2)
Total Protein: 5.8 g/dL — ABNORMAL LOW (ref 6.5–8.1)

## 2022-09-14 LAB — CBC WITH DIFFERENTIAL (CANCER CENTER ONLY)
Abs Immature Granulocytes: 0.01 10*3/uL (ref 0.00–0.07)
Basophils Absolute: 0 10*3/uL (ref 0.0–0.1)
Basophils Relative: 1 %
Eosinophils Absolute: 0 10*3/uL (ref 0.0–0.5)
Eosinophils Relative: 0 %
HCT: 27.8 % — ABNORMAL LOW (ref 36.0–46.0)
Hemoglobin: 9.4 g/dL — ABNORMAL LOW (ref 12.0–15.0)
Immature Granulocytes: 1 %
Lymphocytes Relative: 20 %
Lymphs Abs: 0.2 10*3/uL — ABNORMAL LOW (ref 0.7–4.0)
MCH: 27.1 pg (ref 26.0–34.0)
MCHC: 33.8 g/dL (ref 30.0–36.0)
MCV: 80.1 fL (ref 80.0–100.0)
Monocytes Absolute: 0.2 10*3/uL (ref 0.1–1.0)
Monocytes Relative: 15 %
Neutro Abs: 0.7 10*3/uL — ABNORMAL LOW (ref 1.7–7.7)
Neutrophils Relative %: 63 %
Platelet Count: 13 10*3/uL — ABNORMAL LOW (ref 150–400)
RBC: 3.47 MIL/uL — ABNORMAL LOW (ref 3.87–5.11)
RDW: 29.1 % — ABNORMAL HIGH (ref 11.5–15.5)
Smear Review: NORMAL
WBC Count: 1.2 10*3/uL — ABNORMAL LOW (ref 4.0–10.5)
nRBC: 1.7 % — ABNORMAL HIGH (ref 0.0–0.2)

## 2022-09-14 LAB — TOTAL PROTEIN, URINE DIPSTICK: Protein, ur: 30 mg/dL — AB

## 2022-09-14 LAB — SAMPLE TO BLOOD BANK

## 2022-09-14 LAB — TYPE AND SCREEN
ABO/RH(D): A POS
Antibody Screen: NEGATIVE

## 2022-09-14 MED ORDER — LIDOCAINE HCL 1 % IJ SOLN
INTRAMUSCULAR | Status: AC
  Administered 2022-09-14: 10 mL
  Filled 2022-09-14: qty 20

## 2022-09-14 MED ORDER — SODIUM CHLORIDE 0.9% FLUSH
10.0000 mL | Freq: Once | INTRAVENOUS | Status: AC
  Administered 2022-09-14: 10 mL

## 2022-09-14 NOTE — Procedures (Addendum)
PROCEDURE SUMMARY:  Successful US guided right thoracentesis. Yielded 1.3 L of clear amber fluid. Patient tolerated procedure well. No immediate complications. EBL = trace  Specimen was sent for labs.  Post procedure chest X-ray reveals no pneumothorax  Patient did have some moderate bleeding from the stick site. Pressure was held until hemostasis was achieved. New larger dry dressing was placed.  Judie Grieve Kylar Leonhardt PA-C 09/14/2022 12:41 PM

## 2022-09-15 ENCOUNTER — Other Ambulatory Visit (HOSPITAL_COMMUNITY): Payer: BC Managed Care – PPO

## 2022-09-15 ENCOUNTER — Inpatient Hospital Stay: Payer: BC Managed Care – PPO

## 2022-09-15 VITALS — BP 110/79 | HR 92 | Temp 98.1°F | Resp 16

## 2022-09-15 DIAGNOSIS — F419 Anxiety disorder, unspecified: Secondary | ICD-10-CM | POA: Diagnosis not present

## 2022-09-15 DIAGNOSIS — I959 Hypotension, unspecified: Secondary | ICD-10-CM | POA: Diagnosis not present

## 2022-09-15 DIAGNOSIS — Z170 Estrogen receptor positive status [ER+]: Secondary | ICD-10-CM | POA: Diagnosis not present

## 2022-09-15 DIAGNOSIS — Z79811 Long term (current) use of aromatase inhibitors: Secondary | ICD-10-CM | POA: Diagnosis not present

## 2022-09-15 DIAGNOSIS — C50111 Malignant neoplasm of central portion of right female breast: Secondary | ICD-10-CM

## 2022-09-15 DIAGNOSIS — K219 Gastro-esophageal reflux disease without esophagitis: Secondary | ICD-10-CM | POA: Diagnosis not present

## 2022-09-15 DIAGNOSIS — Z515 Encounter for palliative care: Secondary | ICD-10-CM | POA: Diagnosis not present

## 2022-09-15 DIAGNOSIS — D61818 Other pancytopenia: Secondary | ICD-10-CM | POA: Diagnosis not present

## 2022-09-15 DIAGNOSIS — C220 Liver cell carcinoma: Secondary | ICD-10-CM | POA: Diagnosis not present

## 2022-09-15 DIAGNOSIS — Z79899 Other long term (current) drug therapy: Secondary | ICD-10-CM | POA: Diagnosis not present

## 2022-09-15 DIAGNOSIS — J90 Pleural effusion, not elsewhere classified: Secondary | ICD-10-CM | POA: Diagnosis not present

## 2022-09-15 DIAGNOSIS — Z5112 Encounter for antineoplastic immunotherapy: Secondary | ICD-10-CM | POA: Diagnosis not present

## 2022-09-15 DIAGNOSIS — E871 Hypo-osmolality and hyponatremia: Secondary | ICD-10-CM | POA: Diagnosis not present

## 2022-09-15 DIAGNOSIS — R5383 Other fatigue: Secondary | ICD-10-CM | POA: Diagnosis not present

## 2022-09-15 DIAGNOSIS — K7460 Unspecified cirrhosis of liver: Secondary | ICD-10-CM | POA: Diagnosis not present

## 2022-09-15 DIAGNOSIS — R161 Splenomegaly, not elsewhere classified: Secondary | ICD-10-CM | POA: Diagnosis not present

## 2022-09-15 DIAGNOSIS — D696 Thrombocytopenia, unspecified: Secondary | ICD-10-CM

## 2022-09-15 LAB — CYTOLOGY - NON PAP

## 2022-09-15 MED ORDER — DIPHENHYDRAMINE HCL 25 MG PO CAPS
25.0000 mg | ORAL_CAPSULE | Freq: Once | ORAL | Status: AC
  Administered 2022-09-15: 25 mg via ORAL
  Filled 2022-09-15: qty 1

## 2022-09-15 MED ORDER — SODIUM CHLORIDE 0.9% IV SOLUTION
250.0000 mL | Freq: Once | INTRAVENOUS | Status: AC
  Administered 2022-09-15: 250 mL via INTRAVENOUS

## 2022-09-15 MED ORDER — HEPARIN SOD (PORK) LOCK FLUSH 100 UNIT/ML IV SOLN
500.0000 [IU] | Freq: Once | INTRAVENOUS | Status: AC
  Administered 2022-09-15: 500 [IU]

## 2022-09-15 MED ORDER — ACETAMINOPHEN 325 MG PO TABS: 650.0000 mg | ORAL_TABLET | Freq: Once | ORAL | Status: DC

## 2022-09-15 MED ORDER — SODIUM CHLORIDE 0.9% FLUSH
10.0000 mL | Freq: Once | INTRAVENOUS | Status: AC
  Administered 2022-09-15: 10 mL

## 2022-09-15 NOTE — Patient Instructions (Signed)
Platelet Transfusion A platelet transfusion is a procedure in which a person receives donated platelets through an IV. Platelets are parts of blood that stick together and form a clot to help the body stop bleeding after an injury. If you have too few platelets, your blood may have trouble clotting. This may cause you to bleed and bruise very easily. You may need a platelet transfusion if you have a condition that causes a low number of platelets (thrombocytopenia). A platelet transfusion may be used to stop or prevent excessive bleeding. Tell a health care provider about: Any reactions you have had during previous transfusions. Any allergies you have. All medicines you are taking, including vitamins, herbs, eye drops, creams, and over-the-counter medicines. Any bleeding problems you have. Any surgeries you have had. Any medical conditions you have. Whether you are pregnant or may be pregnant. What are the risks? Generally, this is a safe procedure. However, problems may occur, including: Fever. Infection. Allergic reaction to the donated (donor) platelets. Your body's disease-fighting system (immune system) attacking the donor platelets (hemolytic reaction). This is rare. A rare reaction that causes lung damage (transfusion-related acute lung injury). What happens before the procedure? Medicines Ask your health care provider about: Changing or stopping your regular medicines. This is especially important if you are taking diabetes medicines or blood thinners. Taking medicines such as aspirin and ibuprofen. These medicines can thin your blood. Do not take these medicines unless your health care provider tells you to take them. Taking over-the-counter medicines, vitamins, herbs, and supplements. General instructions You will have a blood test to determine your blood type. Your blood type determines what kind of platelets you will be given. Follow instructions from your health care provider  about eating or drinking restrictions. If you have had an allergic reaction to a transfusion in the past, you may be given medicine to help prevent a reaction. Your temperature, blood pressure, pulse, and breathing will be monitored. What happens during the procedure?  An IV will be inserted into one of your veins. For your safety, two health care providers will verify your identity along with the donor platelets about to be infused. A bag of donor platelets will be connected to your IV. The platelets will flow into your bloodstream. This usually takes 30-60 minutes. Your temperature, blood pressure, pulse, and breathing will be monitored during the transfusion. This helps detect early signs of any reaction. You will also be monitored for other symptoms that may indicate a reaction, including chills, hives, or itching. If you have signs of a reaction at any time, your transfusion will be stopped, and you may be given medicine to help manage the reaction. When your transfusion is complete, your IV will be removed. Pressure may be applied to the IV site for a few minutes to stop any bleeding. The IV site will be covered with a bandage (dressing). The procedure may vary among health care providers and hospitals. What can I expect after the procedure? Your blood pressure, temperature, pulse, and breathing will be monitored until you leave the hospital or clinic. You may have some bruising and soreness at your IV site. Follow these instructions at home: Medicines Take over-the-counter and prescription medicines only as told by your health care provider. Talk with your health care provider before you take any medicines that contain aspirin or NSAIDs, such as ibuprofen. These medicines increase your risk for dangerous bleeding. IV site care Check your IV site every day for signs of infection. Check for:   Redness, swelling, or pain. Fluid or blood. If fluid or blood drains from your IV site, use your  hands to press down firmly on a bandage covering the area for a minute or two. Doing this should stop the bleeding. Warmth. Pus or a bad smell. General instructions Change or remove your dressing as told by your health care provider. Return to your normal activities as told by your health care provider. Ask your health care provider what activities are safe for you. Do not take baths, swim, or use a hot tub until your health care provider approves. Ask your health care provider if you may take showers. Keep all follow-up visits. This is important. Contact a health care provider if: You have a headache that does not go away with medicine. You have hives, rash, or itchy skin. You have nausea or vomiting. You feel unusually tired or weak. You have signs of infection at your IV site. Get help right away if: You have a fever or chills. You urinate less often than usual. Your urine is darker colored than normal. You have any of the following: Trouble breathing. Pain in your back, abdomen, or chest. Cool, clammy skin. A fast heartbeat. Summary Platelets are tiny pieces of blood cells that clump together to form a blood clot when you have an injury. If you have too few platelets, your blood may have trouble clotting. A platelet transfusion is a procedure in which you receive donated platelets through an IV. A platelet transfusion may be used to stop or prevent excessive bleeding. After the procedure, check your IV site every day for signs of infection. This information is not intended to replace advice given to you by your health care provider. Make sure you discuss any questions you have with your health care provider. Document Revised: 05/21/2021 Document Reviewed: 05/21/2021 Elsevier Patient Education  2023 [employer]  

## 2022-09-16 LAB — PREPARE PLATELET PHERESIS: Unit division: 0

## 2022-09-16 LAB — BPAM PLATELET PHERESIS
Blood Product Expiration Date: 202310192359
ISSUE DATE / TIME: 202310181116
Unit Type and Rh: 5100

## 2022-09-17 ENCOUNTER — Telehealth: Payer: Self-pay

## 2022-09-17 NOTE — Telephone Encounter (Signed)
Pt called requesting is she can have another CXR and schedule a RT Thoracentesis.  Asked pt did she get the thoracentesis on Tuesday in IR.  Pt stated "Yes" but feels that she's SOB and needs another one.  Pt stated that she feels it's her Rt side that needs another thoracentesis.  Pt currently has a Pluerx drain in the left lung which she drains at home.  Informed pt that [name] nor Cira Rue, NP are in the office today.  Instructed pt to go to the ED for further evaluation since she just had a thoracentesis on Tuesday and is c/o of severe SOB.  Pt verbalized understanding.  Notified both [name] and Cira Rue, NP of the pt's call.

## 2022-09-20 ENCOUNTER — Telehealth: Payer: Self-pay | Admitting: Nurse Practitioner

## 2022-09-20 ENCOUNTER — Telehealth: Payer: Self-pay

## 2022-09-20 NOTE — Telephone Encounter (Signed)
Pt states that she has been having issues with GERD for about a week now; Pt states that she is taking the Protonix  and Pepcid with no relief: Pt was scheduled for an office visit on 10/11/2022 at 1:30 PM with Carl Best NP: Pt made aware: Pt seeking something else prescribed prior to office visit: Please advise :

## 2022-09-20 NOTE — Telephone Encounter (Signed)
Patient called is having issues with Jerrye Bushy and swallowing.

## 2022-09-20 NOTE — Telephone Encounter (Signed)
Called patient to follow up on her shortness of breath, she states that it has gotten better and this LPN offered the patient to see our PA Marcha Solders. in the symptom management clinic and she declined. She states she feels fine for now and she has treatment tomorrow if anything changes. She knows to call the office with any concerns before then.

## 2022-09-21 ENCOUNTER — Encounter: Payer: Self-pay | Admitting: Hematology

## 2022-09-21 ENCOUNTER — Ambulatory Visit (HOSPITAL_COMMUNITY)
Admission: RE | Admit: 2022-09-21 | Discharge: 2022-09-21 | Disposition: A | Discharge status: Home / Self Care | Payer: BC Managed Care – PPO | Source: Ambulatory Visit | Attending: Hematology | Admitting: Hematology

## 2022-09-21 ENCOUNTER — Inpatient Hospital Stay (HOSPITAL_BASED_OUTPATIENT_CLINIC_OR_DEPARTMENT_OTHER): Payer: BC Managed Care – PPO | Admitting: Hematology

## 2022-09-21 ENCOUNTER — Other Ambulatory Visit (HOSPITAL_COMMUNITY): Payer: Self-pay

## 2022-09-21 ENCOUNTER — Inpatient Hospital Stay: Payer: BC Managed Care – PPO

## 2022-09-21 ENCOUNTER — Other Ambulatory Visit: Payer: Self-pay

## 2022-09-21 ENCOUNTER — Telehealth: Payer: Self-pay

## 2022-09-21 VITALS — BP 113/79 | HR 105 | Temp 98.0°F | Resp 16 | Ht 63.0 in | Wt 82.0 lb

## 2022-09-21 DIAGNOSIS — J90 Pleural effusion, not elsewhere classified: Secondary | ICD-10-CM

## 2022-09-21 DIAGNOSIS — D61818 Other pancytopenia: Secondary | ICD-10-CM | POA: Diagnosis not present

## 2022-09-21 DIAGNOSIS — C220 Liver cell carcinoma: Secondary | ICD-10-CM

## 2022-09-21 DIAGNOSIS — K219 Gastro-esophageal reflux disease without esophagitis: Secondary | ICD-10-CM

## 2022-09-21 DIAGNOSIS — D649 Anemia, unspecified: Secondary | ICD-10-CM

## 2022-09-21 DIAGNOSIS — C50111 Malignant neoplasm of central portion of right female breast: Secondary | ICD-10-CM

## 2022-09-21 DIAGNOSIS — D696 Thrombocytopenia, unspecified: Secondary | ICD-10-CM

## 2022-09-21 DIAGNOSIS — Z79811 Long term (current) use of aromatase inhibitors: Secondary | ICD-10-CM | POA: Diagnosis not present

## 2022-09-21 DIAGNOSIS — Z170 Estrogen receptor positive status [ER+]: Secondary | ICD-10-CM | POA: Diagnosis not present

## 2022-09-21 DIAGNOSIS — R5383 Other fatigue: Secondary | ICD-10-CM | POA: Diagnosis not present

## 2022-09-21 DIAGNOSIS — F419 Anxiety disorder, unspecified: Secondary | ICD-10-CM | POA: Diagnosis not present

## 2022-09-21 DIAGNOSIS — E871 Hypo-osmolality and hyponatremia: Secondary | ICD-10-CM | POA: Diagnosis not present

## 2022-09-21 DIAGNOSIS — R161 Splenomegaly, not elsewhere classified: Secondary | ICD-10-CM | POA: Diagnosis not present

## 2022-09-21 DIAGNOSIS — K7460 Unspecified cirrhosis of liver: Secondary | ICD-10-CM | POA: Diagnosis not present

## 2022-09-21 DIAGNOSIS — Z515 Encounter for palliative care: Secondary | ICD-10-CM | POA: Diagnosis not present

## 2022-09-21 DIAGNOSIS — I959 Hypotension, unspecified: Secondary | ICD-10-CM | POA: Diagnosis not present

## 2022-09-21 DIAGNOSIS — Z79899 Other long term (current) drug therapy: Secondary | ICD-10-CM | POA: Diagnosis not present

## 2022-09-21 DIAGNOSIS — Z5112 Encounter for antineoplastic immunotherapy: Secondary | ICD-10-CM | POA: Diagnosis not present

## 2022-09-21 LAB — SAMPLE TO BLOOD BANK

## 2022-09-21 LAB — CMP (CANCER CENTER ONLY)
ALT: 18 U/L (ref 0–44)
AST: 29 U/L (ref 15–41)
Albumin: 2.5 g/dL — ABNORMAL LOW (ref 3.5–5.0)
Alkaline Phosphatase: 69 U/L (ref 38–126)
Anion gap: 6 (ref 5–15)
BUN: 19 mg/dL (ref 6–20)
CO2: 23 mmol/L (ref 22–32)
Calcium: 8.2 mg/dL — ABNORMAL LOW (ref 8.9–10.3)
Chloride: 98 mmol/L (ref 98–111)
Creatinine: 0.63 mg/dL (ref 0.44–1.00)
GFR, Estimated: >60 mL/min (ref >60)
Glucose, Bld: 111 mg/dL — ABNORMAL HIGH (ref 70–99)
Potassium: 4.1 mmol/L (ref 3.5–5.1)
Sodium: 127 mmol/L — ABNORMAL LOW (ref 135–145)
Total Bilirubin: 2.2 mg/dL — ABNORMAL HIGH (ref 0.3–1.2)
Total Protein: 5.8 g/dL — ABNORMAL LOW (ref 6.5–8.1)

## 2022-09-21 LAB — CBC WITH DIFFERENTIAL (CANCER CENTER ONLY)
Abs Immature Granulocytes: 0.02 10*3/uL (ref 0.00–0.07)
Basophils Absolute: 0.1 10*3/uL (ref 0.0–0.1)
Basophils Relative: 2 %
Eosinophils Absolute: 0 10*3/uL (ref 0.0–0.5)
Eosinophils Relative: 1 %
HCT: 30.6 % — ABNORMAL LOW (ref 36.0–46.0)
Hemoglobin: 10.3 g/dL — ABNORMAL LOW (ref 12.0–15.0)
Immature Granulocytes: 1 %
Lymphocytes Relative: 20 %
Lymphs Abs: 0.9 10*3/uL (ref 0.7–4.0)
MCH: 28.5 pg (ref 26.0–34.0)
MCHC: 33.7 g/dL (ref 30.0–36.0)
MCV: 84.5 fL (ref 80.0–100.0)
Monocytes Absolute: 1 10*3/uL (ref 0.1–1.0)
Monocytes Relative: 24 %
Neutro Abs: 2.3 10*3/uL (ref 1.7–7.7)
Neutrophils Relative %: 52 %
Platelet Count: 68 10*3/uL — ABNORMAL LOW (ref 150–400)
RBC: 3.62 MIL/uL — ABNORMAL LOW (ref 3.87–5.11)
WBC Count: 4.3 10*3/uL (ref 4.0–10.5)
nRBC: 0 % (ref 0.0–0.2)

## 2022-09-21 LAB — FERRITIN: Ferritin: 51 ng/mL (ref 11–307)

## 2022-09-21 MED ORDER — NYSTATIN 100000 UNIT/ML MT SUSP
5.0000 mL | Freq: Four times a day (QID) | OROMUCOSAL | 1 refills | Status: AC | PRN
  Filled 2022-09-21: qty 120, 6d supply, fill #0

## 2022-09-21 MED ORDER — SUCRALFATE 1 GM/10ML PO SUSP: 1.0000 g | Freq: Two times a day (BID) | ORAL | 0 refills | Status: AC

## 2022-09-21 MED ORDER — SODIUM CHLORIDE 0.9 % IV SOLN: 8.0000 mg | Freq: Once | INTRAVENOUS | Status: DC

## 2022-09-21 MED ORDER — SODIUM CHLORIDE 0.9% FLUSH
10.0000 mL | Freq: Once | INTRAVENOUS | Status: AC | PRN
  Administered 2022-09-21: 10 mL

## 2022-09-21 MED ORDER — ALPRAZOLAM 0.5 MG PO TABS: 0.5000 mg | ORAL_TABLET | Freq: Two times a day (BID) | ORAL | 0 refills | Status: AC | PRN

## 2022-09-21 MED ORDER — HEPARIN SOD (PORK) LOCK FLUSH 100 UNIT/ML IV SOLN
500.0000 [IU] | Freq: Once | INTRAVENOUS | Status: AC | PRN
  Administered 2022-09-21: 500 [IU]

## 2022-09-21 MED ORDER — SODIUM CHLORIDE 0.9 % IV SOLN: Freq: Once | INTRAVENOUS | Status: AC

## 2022-09-21 MED ORDER — OXYCODONE HCL 5 MG PO TABS: 5.0000 mg | ORAL_TABLET | Freq: Four times a day (QID) | ORAL | 0 refills | Status: AC | PRN

## 2022-09-21 MED ORDER — ONDANSETRON HCL 4 MG/2ML IJ SOLN
8.0000 mg | Freq: Once | INTRAMUSCULAR | Status: AC
  Administered 2022-09-21: 8 mg via INTRAVENOUS
  Filled 2022-09-21: qty 4

## 2022-09-21 NOTE — Telephone Encounter (Signed)
Theresa Rocha, patient previously took Carafate suspension 1 gm (10/ml) po bid. Pls contact patient and send in RX for Carafate 1 gm suspension 2m po bid x 10 days # 200cc, no refill. Not to take within 2 hours of any other medication. Pt to contact office in one week with update, sooner if sx worsen. THX.

## 2022-09-21 NOTE — Telephone Encounter (Signed)
LVM informing pt that this RN scheduled her MRI of her abdomen on 09/25/2022 at [facility] with an arrival time of 1430 and NPO 4 hr prior.  Instructed pt to contact Dr. Ernestina Penna office should she have additional questions or concerns.

## 2022-09-21 NOTE — Progress Notes (Signed)
Per verbal orders from [name], place referral for Palliative Care with [facility], place order for Magic Mouthwash with Lidocaine with [facility] 1:1 equal parts of Diphenhydramine, Maalox, Nystatin, and Lidocaine Viscous 105m Q4hrs PRN Swish & Swallow.  Notify infusion that pt will get 5050mNS over 1hr.  Checked with FMLA/Disability Team regarding pt's husband's FMLA paperwork.  [place]'s [place] to complete the paperwork.    [facility] Fax confirmation received.

## 2022-09-21 NOTE — Telephone Encounter (Signed)
Pt made aware of Carl Best NP recommendations:  Prescription sent to pharmacy: Pt made aware: Pt verbalized understanding with all questions answered.

## 2022-09-21 NOTE — Patient Instructions (Signed)
Dehydration, Adult Dehydration is a condition in which there is not enough water or other fluids in the body. This happens when a person loses more fluids than he or she takes in. Important organs, such as the kidneys, brain, and heart, cannot function without a proper amount of fluids. Any loss of fluids from the body can lead to dehydration. Dehydration can be mild, moderate, or severe. It should be treated right away to prevent it from becoming severe. What are the causes? Dehydration may be caused by: Conditions that cause loss of water or other fluids, such as diarrhea, vomiting, or sweating or urinating a lot. Not drinking enough fluids, especially when you are ill or doing activities that require a lot of energy. Other illnesses and conditions, such as fever or infection. Certain medicines, such as medicines that remove excess fluid from the body (diuretics). Lack of safe drinking water. Not being able to get enough water and food. What increases the risk? The following factors may make you more likely to develop this condition: Having a long-term (chronic) illness that has not been treated properly, such as diabetes, heart disease, or kidney disease. Being 65 years of age or older. Having a disability. Living in a place that is high in altitude, where thinner, drier air causes more fluid loss. Doing exercises that put stress on your body for a long time (endurance sports). What are the signs or symptoms? Symptoms of dehydration depend on how severe it is. Mild or moderate dehydration Thirst. Dry lips or dry mouth. Dizziness or light-headedness, especially when standing up from a seated position. Muscle cramps. Dark urine. Urine may be the color of tea. Less urine or tears produced than usual. Headache. Severe dehydration Changes in skin. Your skin may be cold and clammy, blotchy, or pale. Your skin also may not return to normal after being lightly pinched and released. Little or  no tears, urine, or sweat. Changes in vital signs, such as rapid breathing and low blood pressure. Your pulse may be weak or may be faster than 100 beats a minute when you are sitting still. Other changes, such as: Feeling very thirsty. Sunken eyes. Cold hands and feet. Confusion. Being very tired (lethargic) or having trouble waking from sleep. Short-term weight loss. Loss of consciousness. How is this diagnosed? This condition is diagnosed based on your symptoms and a physical exam. You may have blood and urine tests to help confirm the diagnosis. How is this treated? Treatment for this condition depends on how severe it is. Treatment should be started right away. Do not wait until dehydration becomes severe. Severe dehydration is an emergency and needs to be treated in a hospital. Mild or moderate dehydration can be treated at home. You may be asked to: Drink more fluids. Drink an oral rehydration solution (ORS). This drink helps restore proper amounts of fluids and salts and minerals in the blood (electrolytes). Severe dehydration can be treated: With IV fluids. By correcting abnormal levels of electrolytes. This is often done by giving electrolytes through a tube that is passed through your nose and into your stomach (nasogastric tube, or NG tube). By treating the underlying cause of dehydration. Follow these instructions at home: Oral rehydration solution If told by your health care provider, drink an ORS: Make an ORS by following instructions on the package. Start by drinking small amounts, about  cup (120 mL) every 5-10 minutes. Slowly increase how much you drink until you have taken the amount recommended by your health   care provider. Eating and drinking        Drink enough clear fluid to keep your urine pale yellow. If you were told to drink an ORS, finish the ORS first and then start slowly drinking other clear fluids. Drink fluids such as: Water. Do not drink only  water. Doing that can lead to hyponatremia, which is having too little salt (sodium) in the body. Water from ice chips you suck on. Fruit juice that you have added water to (diluted fruit juice). Low-calorie sports drinks. Eat foods that contain a healthy balance of electrolytes, such as bananas, oranges, potatoes, tomatoes, and spinach. Do not drink alcohol. Avoid the following: Drinks that contain a lot of sugar. These include high-calorie sports drinks, fruit juice that is not diluted, and soda. Caffeine. Foods that are greasy or contain a lot of fat or sugar. General instructions Take over-the-counter and prescription medicines only as told by your health care provider. Do not take sodium tablets. Doing that can lead to having too much sodium in the body (hypernatremia). Return to your normal activities as told by your health care provider. Ask your health care provider what activities are safe for you. Keep all follow-up visits as told by your health care provider. This is important. Contact a health care provider if: You have muscle cramps, pain, or discomfort, such as: Pain in your abdomen and the pain gets worse or stays in one area (localizes). Stiff neck. You have a rash. You are more irritable than usual. You are sleepier or have a harder time waking than usual. You feel weak or dizzy. You feel very thirsty. Get help right away if you have: Any symptoms of severe dehydration. Symptoms of vomiting, such as: You cannot eat or drink without vomiting. Vomiting gets worse or does not go away. Vomit includes blood or green matter (bile). Symptoms that get worse with treatment. A fever. A severe headache. Problems with urination or bowel movements, such as: Diarrhea that gets worse or does not go away. Blood in your stool (feces). This may cause stool to look black and tarry. Not urinating, or urinating only a small amount of very dark urine, within 6-8 hours. Trouble  breathing. These symptoms may represent a serious problem that is an emergency. Do not wait to see if the symptoms will go away. Get medical help right away. Call your local emergency services (911 in the [place] Do not drive yourself to the hospital. Summary Dehydration is a condition in which there is not enough water or other fluids in the body. This happens when a person loses more fluids than he or she takes in. Treatment for this condition depends on how severe it is. Treatment should be started right away. Do not wait until dehydration becomes severe. Drink enough clear fluid to keep your urine pale yellow. If you were told to drink an oral rehydration solution (ORS), finish the ORS first and then start slowly drinking other clear fluids. Take over-the-counter and prescription medicines only as told by your health care provider. Get help right away if you have any symptoms of severe dehydration. This information is not intended to replace advice given to you by your health care provider. Make sure you discuss any questions you have with your health care provider. Document Revised: 03/24/2022 Document Reviewed: 06/28/2019 Elsevier Patient Education  2023 [employer] Inc.  

## 2022-09-21 NOTE — Progress Notes (Signed)
[place]   [phone] [phone]   Clinic Follow up Note   Patient Care Team: Seward Carol, MD as PCP - General (Internal Medicine) Irene Shipper, MD as Consulting Physician (Gastroenterology) Truitt Merle, MD as Consulting Physician (Hematology) Alla Feeling, NP as Nurse Practitioner (Nurse Practitioner) Stark Klein, MD as Consulting Physician (General Surgery) Aletta Edouard, MD as Consulting Physician (Interventional Radiology) Roosevelt Locks, CRNP as Nurse Practitioner (Nurse Practitioner) Mauro Kaufmann, RN as Oncology Nurse Navigator Rockwell Germany, RN as Oncology Nurse Navigator Eppie Gibson, MD as Attending Physician (Radiation Oncology) Trula Slade, DPM as Consulting Physician (Podiatry) [place] as Consulting Physician [facility] and Palliative Medicine)  Date of Service:  09/21/2022  CHIEF COMPLAINT: f/u of metastatic breast cancer, Theresa Rocha  CURRENT THERAPY:  -Tecentriq/Bevacizumab, q21d, starting 06/29/22 -Anastrozole, starting 08/16/22 Theresa Rocha, days 1-21 q28d, starting 08/24/22  ASSESSMENT & PLAN:  Theresa Rocha is a 56 y.o. female with   1. Symptom Management: Fatigue, Odynophagia, Weight loss, Anxiety, Thrombocytopenia,  -while her weight has been low at baseline (around 105-110 at diagnosis), she is consistently below 90 lbs, down to 82 lbs today (09/21/22). -I encouraged her to eat soft diet and keep up her fluid intake. I will call in magic mouthwash. -she reports increased tremors and anxiety; I will call in Xanax for her to use as needed. -she has required platelet transfusion lately.  -due to her poor PS, I will hold treatment for now.  2. Goal of care discussion  -We again discussed the incurable nature of her cancer, and the overall poor prognosis, especially given her poor performance status. -The patient understands the goal of care is palliative. -I will refer her to Rocha palliative  care.  3. Multifocal Hepatocellular carcinoma, cT2N0Mx, stage II -presented with abdominal pain and rectal bleeding in 06/2019. Work up scans showed multifocal lesions of liver consistent with [place] with indeterminate right lobe lesions. AFP was WNL. -not a candidate for transplant or surgery -s/p left lobe ablation on 09/16/21, and right lobe Y90 on 12/22/21 with Dr. Kathlene Cote. -Liver MRI from 06/06/22 showed cancer progression of the previously treated lesion in segment 7 with extra capsular extension and adenopathy. Recommendation is for systemic treatment. -She began Tecentriq and bevacizumab on 06/29/22, she has been tolerating therapy well.  -she is not doing well today (see #1), will hold treatment today and obtain MRI next week.   4. 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 Due to knee joint pain, she was switched to Tamoxifen in 03/2020. She is tolerating with hot flashes.  -she developed left pleural effusion in 08/2021; PET scan 11/2021 and cytology has been negative (last cytology obtained 08/2022). She required PleurX catheter placement 03/09/22. -most recent mammogram from 05/13/22 showed breast calcification in right side. Biopsy on hold. -Tamoxifen held since 06/15/22 for work up of recurrent plural effusion  -[place] PET on 08/13/22 showed mild hypermetabolism to left pleural surface, suspicious for breast cancer metastasis  -she started anastrozole on 08/16/22 and Ibrance on 08/24/22. She is tolerating well thus far. -I advised her to hold Ibrance given low blood counts. We will also obtain chest x-ray today given fluids in right lung on exam.   5. Hyponatremia, Hypotension  -Refractory, secondary to her liver  cirrhosis and high output from thoracic Pleurx -she  still has intermittent hyponatremia, will give IV hydration as needed -sodium 127, BP 113/79, will give 546mL IVF today.     PLAN:  -hold tecentriq/beva today, proceed with NS 523mL -chest x-ray today. I reviewed the report, will hold on thoracentesis for now -continue holding Ibrance  -I called in magic mouthwash and Xanax and refilled oxycodone -f/u in 2 weeks with lab and abdomen MRI a few days before -Referral to hospice [facility] for palliative Rocha care   No problem-specific Assessment & Plan notes found for this encounter.   SUMMARY OF ONCOLOGIC HISTORY: Oncology History Overview Note  Cancer Staging Hepatocellular carcinoma [facility]) Staging form: Liver, AJCC 8th Edition - Clinical stage from 08/02/2019: Stage II (cT2, cN0, cM0) - Signed by Truitt Merle, MD on 08/02/2019  Malignant neoplasm of central portion of right breast in female, estrogen receptor positive ([place]) Staging form: Breast, AJCC 8th Edition - Clinical stage from 08/29/2019: Stage IB (cT2, cN0, cM0, G2, ER+, PR+, HER2-) - Signed by Truitt Merle, MD on 09/05/2019    Hepatocellular carcinoma ([place])  07/04/2019 Imaging   CT AP IMPRESSION: 1. Findings of cirrhosis and hepatomegaly.  Patent portal vein. 2. Numerous varicosities as described above. 3. Moderate to large amount of abdominopelvic ascites 4. Contrast filled appendix which appears to be a mildly dilated with apparent wall thickening which could be due to surrounding ascites.   07/09/2019 Imaging   ABD US IMPRESSION: 1. Three small hypoechoic lesions in the RIGHT hepatic lobe measuring less than 1 cm but not evident on comparison contrast enhanced CT from 07/04/2019. Recommend MRI of the abdomen without and with contrast to evaluate these hepatic lesions in cirrhotic patient. 2. Increased liver echogenicity and lobular contour consistent cirrhosis. 3. Moderate mild to moderate volume of ascites. 4. Mild gallbladder wall thickening likely related to ascites. 5.  No cholelithiasis or cholecystitis evident   07/26/2019 Imaging   MR ABD IMPRESSION: 1. In the lateral segment left hepatic lobe there are two LI-RADS category LR-5 lesions representing hepatocellular carcinoma. 2. There is also a LI-RADS category LR-3 lesion peripherally in the right hepatic lobe, intermediate probability for hepatocellular carcinoma. 3. Hepatic cirrhosis and diffuse wall thickening of the gallbladder. Widespread steatosis in the liver. 4. 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 5.  Aortic Atherosclerosis (ICD10-I70.0). 6. Widespread ascites and mesenteric edema.   07/26/2019 Tumor Marker   AFP 5.5 (baseline)   08/02/2019 Initial Diagnosis   Hepatocellular carcinoma ([place])   08/02/2019 Cancer Staging   Staging form: Liver, AJCC 8th Edition - Clinical stage from 08/02/2019: Stage II (cT2, cN0, cM0) - Signed by Truitt Merle, MD on 08/02/2019   08/07/2019 Imaging   CT Chest 08/07/19 IMPRESSION: 1. Tiny bilateral pulmonary nodules measuring up to 6 mm. These are too small to definitively characterize. Given the history of HCC, close follow-up recommended to ensure stability. 2.  Emphysema. (ICD10-J43.9)   08/09/2019 Procedure   Upper Endoscopy by Dr Henrene Pastor 08/09/19  IMPRESSION 1. Small esophageal varices 2. Mild diffuse portal hypertensive gastropathy 3. Otherwise normal EGD.   02/04/2020 Imaging   MRI abdomen  IMPRESSION: Stable exam. The small segment II lesions identified previously as LI-RADS category 5 are stable in size.   Previously characterized tiny LI-RADS 3 lesion in the inferior right liver is stable.   No new suspicious focal hepatic lesion on a background liver morphology consistent with cirrhosis.   Interval decrease in ascites with decrease and diffuse gallbladder wall  thickening.     06/18/2020 Imaging   MRI Abdomen  IMPRESSION: 1. Stable 1.1 x 0.9 cm rounded lesion in the medial left lobe of the liver, hepatic segment II,  demonstrating arterial phase hyperenhancement without washout or capsule. This has previously been described as a LI-RADS category 5 lesion based on prior observations, if categorized by current imaging features this is consistent with LI-RADS category 4. 2. Stable 1.5 x 1.2 cm lesion lateral and inferior to this lesion in hepatic segment II with very subtle evidence of washout but without capsular enhancement. LI-RADS category 5. 3. Stable 5 mm focus of early arterial phase hyperenhancement of the inferior right lobe of the liver, hepatic segment VI, without evidence of washout or capsule. This remains consistent with LI-RADS category 3. 4. No new suspicious lesions or contrast enhancement. 5. Cirrhotic morphology of the liver. 6. Trace perihepatic ascites.     10/30/2021 Imaging   EXAM: MRI ABDOMEN WITHOUT AND WITH CONTRAST  IMPRESSION: 1. Ablation defect of the left lobe of the liver which subtends two lesions previously noted in hepatic segment II. No evidence of residual contrast enhancement to suggest viable tumor.  2. Significant interval increase in size of a hyperenhancing lesion of the posterior liver dome, hepatic segment VII, measuring 4.0 x 3.7 cm, previously 2.6 x 2.5 cm when measured similarly. This demonstrates some heterogeneous contrast enhancement although no clear evidence of washout or capsular enhancement. Findings remain LI-RADS category 5, consistent with hepatocellular carcinoma. 3. Slight interval increase in size of a hyperenhancing lesion of the inferior tip of the right lobe of the liver, hepatic segment VI, measuring 1.1 x 0.7 cm, previously no greater than 0.6 cm, as well as an additional adjacent lesion measuring 0.6 cm, previously 0.4 cm. Given threshold growth, these lesions are upstaged to LI-RADS category 4 and suspicious for additional small foci of hepatocellular carcinoma. 4. Cirrhotic morphology of the liver and hepatic steatosis. 5. Small volume  ascites throughout the abdomen. 6. Large left, small right pleural effusions and associated atelectasis or consolidation. 7. Layering sludge in the gallbladder with mild gallbladder wall thickening and pericholecystic fluid, similar to prior examination although nonspecific in the setting of ascites. No discrete gallstones.   11/10/2021 Imaging   EXAM: CT ABDOMEN WITHOUT AND WITH CONTRAST  IMPRESSION: Post treatment changes about the LEFT hepatic lobe lesion with variable arterial enhancement not showing washout and with non masslike features, favor perfusional defect, characterized as TR equivocal but favor nonviable.   Lesion in the posterior RIGHT hepatic lobe over time with enlargement and with serpiginous internal low signal on MR and low attenuation on CT. Findings categorized as LR M given signs of rim like enhancement on previous imaging and infiltrative appearance. Potentially infiltrative HCC but without specific imaging features of hepatocellular carcinoma by LI-RADS.   Enlarging lesion in the inferior RIGHT hepatic lobe greater than 50% diameter increase over 6 month interval, LI-RADS category 5 showing non rim arterial phase enhancement.   Additional foci of arterial phase enhancement without definitive washout, categorized as LI-RADS category 3 but in aggregate are more suspicious for small foci of hepatocellular carcinoma, attention on follow-up.   11/19/2021 Pathology Results   A. PLEURAL FLUID, LEFT, THORACENTESIS:   FINAL MICROSCOPIC DIAGNOSIS:  - No malignant cells identified  - Reactive mesothelial cells present    12/15/2021 PET scan   IMPRESSION: 1. Intensely hypermetabolic mass in the posterior RIGHT hepatic lobe consistent with known hepatocellular carcinoma. Smaller lesions identified on MRI are not  hypermetabolic. 2. No evidence of metastatic lymphadenopathy or distant metastatic disease. 3. Elongated nodular thickening in the lingula is favored benign. Moderate  LEFT pleural effusion.   06/29/2022 - 07/19/2022 Chemotherapy   Patient is on Treatment Plan : LUNG Atezolizumab + Bevacizumab q21d Maintenance     07/09/2022 -  Chemotherapy   Patient is on Treatment Plan : LUNG Atezolizumab + Bevacizumab Maintenance q21d     Malignant neoplasm of central portion of right breast in female, estrogen receptor positive ([place])  08/29/2019 Cancer Staging   Staging form: Breast, AJCC 8th Edition - Clinical stage from 08/29/2019: Stage IB (cT2, cN0, cM0, G2, ER+, PR+, HER2-) - Signed by Truitt Merle, MD on 09/05/2019   08/29/2019 Mammogram   Diagnostic mammogram 08/29/19 IMPRESSION: Suspicious mass in the 12 o'clock region of the right breast 3 cm from the nipple measuring 3.2 x 1.6 x 2.6 cm   08/29/2019 Initial Biopsy   Diagnosis 08/29/19 Breast, right, needle core biopsy, 12 o'clock - INVASIVE DUCTAL CARCINOMA, SEE COMMENT.   08/29/2019 Receptors her2   Results: IMMUNOHISTOCHEMICAL AND MORPHOMETRIC ANALYSIS PERFORMED MANUALLY The tumor cells are NEGATIVE for Her2 (1+). Estrogen Receptor: 100%, POSITIVE, STRONG STAINING INTENSITY Progesterone Receptor: 90%, POSITIVE, STRONG STAINING INTENSITY Proliferation Marker Ki67: 15%   09/05/2019 Initial Diagnosis   Malignant neoplasm of central portion of right breast in female, estrogen receptor positive ([place])   09/13/2019 Breast MRI   MRI breast 09/13/19 IMPRESSION: 1. Irregular spiculated enhancing mass in the right breast representing the patient's known malignancy. No abnormal right axillary lymph nodes. 2. Prominent left internal mammary lymph node is indeterminate. No abnormal appearing right internal mammary lymph nodes. 3. No evidence of malignancy in the left breast.   10/16/2019 Cancer Staging   Staging form: Breast, AJCC 8th Edition - Pathologic stage from 10/16/2019: Stage IA (pT2, pN0, cM0, G2, ER+, PR+, HER2-, Oncotype DX score: 21) - Signed by Gardenia Phlegm, NP on 11/07/2019   10/16/2019  Oncotype testing   Oncotype 10/16/19 Recurrence score 21 with 7% benefit of Tamoxifen or AI. There is less than 1 % benefit of chemo.    10/16/2019 Surgery   RIGHT BREAST LUMPECTOMY WITH SENTINEL LYMPH NODE BIOPSY by Dr Barry Dienes 10/16/19    10/16/2019 Pathology Results   FINAL MICROSCOPIC DIAGNOSIS: 10/16/19  A. BREAST, RIGHT, LUMPECTOMY:  - Invasive ductal carcinoma, 2.3 cm, Nottingham grade 2 of 3.  - Margins of resection are not involved (Closest margin: 1 mm,  anterior).  - See oncology table.   B. SENTINEL LYMPH NODE, RIGHT AXILLARY #1, BIOPSY:  - One lymph node, negative for carcinoma (0/1).   C. SENTINEL LYMPH NODE, RIGHT AXILLARY #2, BIOPSY:  - One lymph node, negative for carcinoma (0/1).    09/2019 -  Anti-estrogen oral therapy   Anastrozole $RemoveBefo'1mg'HvKnbQHToXQ$  daily started in Nov 2020. Due to worsened knee pain/arthritis, she was switched to Tamoxifen in 03/2020.    12/03/2019 - 12/31/2019 Radiation Therapy   Adjuvant RT with Dr Isidore Moos 12/03/19-12/31/19   04/01/2020 Survivorship   SCP delivered by virtual visit per Cira Rue, NP       INTERVAL HISTORY:  Theresa Rocha is here for a follow up of metastatic breast cancer and [place]. She was last seen by me on 09/07/22. She presents to the clinic accompanied by her husband. She reports she feels shaky and panicked. They also report she is not eating much, and her weight is down further. She explains that she experiences trouble swallowing hard foods with  throat pain and worsening reflux.   All other systems were reviewed with the patient and are negative.  MEDICAL HISTORY:  Past Medical History:  Diagnosis Date   Anemia    Breast cancer ([place]) 08/30/2019   Cancer ([place])    liver- just diagnosed, not started treatment yet   Cervicalgia    COPD (chronic obstructive pulmonary disease) (HCC)    Esophageal varices (HCC)    GERD (gastroesophageal reflux disease)    Headache    Hepatic cirrhosis (HCC)    Hepatitis C    resolved 2018    Hepatocellular carcinoma (H[facility])    IBS (irritable bowel syndrome)    Lactose intolerance    Personal history of radiation therapy 11/2019   Right breast   Portal hypertension ([place])    Raynaud's syndrome    Thrombocytopenia ([place])     SURGICAL HISTORY: Past Surgical History:  Procedure Laterality Date   BREAST BIOPSY Right 09/2019   BREAST LUMPECTOMY Right 09/2019   BREAST LUMPECTOMY WITH AXILLARY LYMPH NODE BIOPSY Right 10/16/2019   Procedure: RIGHT BREAST LUMPECTOMY WITH SENTINEL LYMPH NODE BIOPSY;  Surgeon: Stark Klein, MD;  Location: [facility];  Service: General;  Laterality: Right;   COLONOSCOPY     ESOPHAGEAL BANDING  10/27/2021   Procedure: ESOPHAGEAL BANDING;  Surgeon: Irene Shipper, MD;  Location: [facility];  Service: Endoscopy;;   ESOPHAGOGASTRODUODENOSCOPY (EGD) WITH PROPOFOL N/A 10/27/2021   Procedure: ESOPHAGOGASTRODUODENOSCOPY (EGD) WITH PROPOFOL;  Surgeon: Irene Shipper, MD;  Location: [facility];  Service: Endoscopy;  Laterality: N/A;   ESOPHAGOGASTRODUODENOSCOPY (EGD) WITH PROPOFOL N/A 01/04/2022   Procedure: ESOPHAGOGASTRODUODENOSCOPY (EGD) WITH PROPOFOL;  Surgeon: Irene Shipper, MD;  Location: [facility];  Service: Endoscopy;  Laterality: N/A;   HAMMER TOE SURGERY Left 2012   pins placed in great toe, 2nd,3rd, 4th toes, all pins removed except great toe   IR ANGIOGRAM SELECTIVE EACH ADDITIONAL VESSEL  12/16/2021   IR ANGIOGRAM SELECTIVE EACH ADDITIONAL VESSEL  12/16/2021   IR ANGIOGRAM SELECTIVE EACH ADDITIONAL VESSEL  12/22/2021   IR ANGIOGRAM VISCERAL SELECTIVE  12/16/2021   IR ANGIOGRAM VISCERAL SELECTIVE  12/22/2021   IR EMBO TUMOR ORGAN ISCHEMIA INFARCT INC GUIDE ROADMAPPING  12/22/2021   IR IMAGING GUIDED PORT INSERTION  07/07/2022   IR PARACENTESIS  11/13/2019   IR PARACENTESIS  03/04/2022   IR PATIENT EVAL TECH 0-60 MINS  05/19/2022   IR PERC PLEURAL DRAIN W/INDWELL CATH W/IMG GUIDE  03/09/2022   IR PERC PLEURAL DRAIN W/INDWELL CATH W/IMG GUIDE  09/01/2022   IR  RADIOLOGIST EVAL & MGMT  08/16/2019   IR RADIOLOGIST EVAL & MGMT  02/06/2020   IR RADIOLOGIST EVAL & MGMT  06/26/2020   IR RADIOLOGIST EVAL & MGMT  12/31/2020   IR RADIOLOGIST EVAL & MGMT  07/21/2021   IR RADIOLOGIST EVAL & MGMT  10/08/2021   IR RADIOLOGIST EVAL & MGMT  01/13/2022   IR RADIOLOGIST EVAL & MGMT  03/05/2022   IR RADIOLOGIST EVAL & MGMT  09/06/2022   IR THORACENTESIS ASP PLEURAL SPACE W/IMG GUIDE  10/12/2021   IR THORACENTESIS ASP PLEURAL SPACE W/IMG GUIDE  10/26/2021   IR THORACENTESIS ASP PLEURAL SPACE W/IMG GUIDE  11/19/2021   IR THORACENTESIS ASP PLEURAL SPACE W/IMG GUIDE  12/22/2021   IR US GUIDE VASC ACCESS RIGHT  12/16/2021   IR US GUIDE VASC ACCESS RIGHT  12/22/2021   PELVIC LAPAROSCOPY Bilateral 1992   RADIOLOGY WITH ANESTHESIA N/A 09/16/2021   Procedure: CT MICROWAVE ABLATION;  Surgeon:  Aletta Edouard, MD;  Location: [facility];  Service: Radiology;  Laterality: N/A;   UPPER GASTROINTESTINAL ENDOSCOPY  2002   had esophageal dilitation    I have reviewed the social history and family history with the patient and they are unchanged from previous note.  ALLERGIES:  is allergic to hydromorphone hcl and aspirin.  MEDICATIONS:  Current Outpatient Medications  Medication Sig Dispense Refill   ALPRAZolam (XANAX) 0.5 MG tablet Take 1 tablet (0.5 mg total) by mouth 2 (two) times daily as needed for anxiety. 30 tablet 0   anastrozole (ARIMIDEX) 1 MG tablet Take 1 tablet (1 mg total) by mouth daily. 30 tablet 2   baclofen (LIORESAL) 10 MG tablet Take 5 mg by mouth daily as needed for muscle spasms.     dicyclomine (BENTYL) 20 MG tablet Take 0.5 tablets (10 mg total) by mouth every 6 (six) hours as needed for spasms. Take one tablet every 6-8 hours as needed for abdominal cramping (Patient taking differently: Take 20 mg by mouth daily as needed (abdominal cramping).) 90 tablet 0   guaiFENesin-codeine 100-10 MG/5ML syrup Take 5 mLs by mouth every 6 (six) hours as needed for cough. 120 mL 0    IBRANCE 125 MG tablet TAKE 1 TABLET DAILY FOR 21 DAYS ON, THEN 7 DAYS OFF. REPEAT EVERY 28 DAYS (Patient not taking: Reported on 09/08/2022) 21 tablet 0   lidocaine-prilocaine (EMLA) cream Apply 1 Application topically as needed. 30 g 2   magic mouthwash (nystatin, lidocaine, diphenhydrAMINE, alum & mag hydroxide) suspension Swish and swallow 5 mLs by mouth 4 (four) times daily as needed for mouth pain. 120 mL 1   nicotine (NICODERM CQ - DOSED IN MG/24 HOURS) 21 mg/24hr patch Place 21 mg onto the skin daily as needed (smoking cessation on work days).     oxyCODONE (OXY IR/ROXICODONE) 5 MG immediate release tablet Take 1 tablet (5 mg total) by mouth every 6 (six) hours as needed for severe pain. 90 tablet 0   pantoprazole (PROTONIX) 40 MG tablet TAKE 1 TABLET BY MOUTH EVERY DAY 90 tablet 1   Prenatal Vit-Fe Fumarate-FA (PRENATAL PO) Take 1 tablet by mouth daily.     promethazine (PHENERGAN) 12.5 MG tablet Take 1 tablet (12.5 mg total) by mouth daily as needed for nausea or vomiting. 20 tablet 3   No current facility-administered medications for this visit.    PHYSICAL EXAMINATION: ECOG PERFORMANCE STATUS: 3 - Symptomatic, >50% confined to bed  Vitals:   09/21/22 0945 09/21/22 0949  BP: 99/77 113/79  Pulse:    Resp:    Temp:    SpO2:     Wt Readings from Last 3 Encounters:  09/21/22 82 lb (37.2 kg)  09/01/22 34 lb 1 oz (15.5 kg)  08/30/22 87 lb 3.2 oz (39.6 kg)     GENERAL:alert, no distress and comfortable SKIN: skin color, texture, turgor are normal, no rashes or significant lesions EYES: normal, Conjunctiva are pink and non-injected, sclera clear  LUNGS: normal breathing effort, (+) fluid in right lung HEART: regular rate & rhythm and no murmurs and no lower extremity edema ABDOMEN:abdomen soft, non-tender and normal bowel sounds Musculoskeletal:no cyanosis of digits and no clubbing  NEURO: alert & oriented x 3 with fluent speech, no focal motor/sensory deficits  LABORATORY  DATA:  I have reviewed the data as listed    Latest Ref Rng & Units 09/21/2022    8:42 AM 09/14/2022   12:34 PM 09/07/2022    2:40 PM  CBC  WBC 4.0 - 10.5 K/uL 4.3  1.2  2.2   Hemoglobin 12.0 - 15.0 g/dL 10.3  9.4  9.9   Hematocrit 36.0 - 46.0 % 30.6  27.8  30.4   Platelets 150 - 400 K/uL 68  13  16         Latest Ref Rng & Units 09/21/2022    8:42 AM 09/14/2022   12:34 PM 09/07/2022    2:40 PM  CMP  Glucose 70 - 99 mg/dL 111  143  100   BUN 6 - 20 mg/dL 19  21  34   Creatinine 0.44 - 1.00 mg/dL 0.63  0.62  1.01   Sodium 135 - 145 mmol/L 127  129  135   Potassium 3.5 - 5.1 mmol/L 4.1  4.1  4.9   Chloride 98 - 111 mmol/L 98  101  108   CO2 22 - 32 mmol/L $RemoveB'23  24  23   'PhEXfmIb$ Calcium 8.9 - 10.3 mg/dL 8.2  7.7  7.7   Total Protein 6.5 - 8.1 g/dL 5.8  5.8  5.7   Total Bilirubin 0.3 - 1.2 mg/dL 2.2  2.4  2.0   Alkaline Phos 38 - 126 U/L 69  66  67   AST 15 - 41 U/L $Remo'29  29  28   'nUohy$ ALT 0 - 44 U/L $Remo'18  15  15       'MqiOC$ RADIOGRAPHIC STUDIES: I have personally reviewed the radiological images as listed and agreed with the findings in the report. DG Chest 2 View  Result Date: 09/21/2022 CLINICAL DATA:  56 year old female with a history of pleural effusion EXAM: CHEST - 2 VIEW COMPARISON:  09/14/2022 FINDINGS: Cardiomediastinal silhouette unchanged in size and contour. Unchanged right IJ port catheter. Unchanged tunneled pleural catheter of the left chest. Increased opacity at the right lung base with obscuration of the right hemi 5 [place] and meniscus. Unchanged residual pleural fluid on the left. Coarsened interstitial markings. No pneumothorax. No new confluent airspace disease. Degenerative changes.  No displaced fracture IMPRESSION: Small right pleural effusion, slightly increased. Unchanged left-sided tunneled pleural catheter and trace left pleural fluid. Electronically Signed   By: Corrie Mckusick D.O.   On: 09/21/2022 11:55      Orders Placed This Encounter  Procedures   MR Abdomen W  Wo Contrast    Standing Status:   Future    Standing Expiration Date:   09/22/2023    Order Specific Question:   If indicated for the ordered procedure, I authorize the administration of contrast media per Radiology protocol    Answer:   Yes    Order Specific Question:   What is the patient's sedation requirement?    Answer:   No Sedation    Order Specific Question:   Does the patient have a pacemaker or implanted devices?    Answer:   No    Order Specific Question:   Preferred imaging location?    Answer:   [facility] (table limit - 550 lbs)   DG Chest 2 View    Standing Status:   Future    Number of Occurrences:   1    Standing Expiration Date:   09/21/2023    Order Specific Question:   Reason for Exam (SYMPTOM  OR DIAGNOSIS REQUIRED)    Answer:   pleural effusion    Order Specific Question:   Is patient pregnant?    Answer:   No    Order Specific Question:  Preferred imaging location?    Answer:   [facility] [place]   Ambulatory referral to Hospice    Referral Priority:   Routine    Referral Type:   Consultation    Referral Reason:   Specialty Services Required    Requested Specialty:   Hospice Services    Number of Visits Requested:   1   All questions were answered. The patient knows to call the clinic with any problems, questions or concerns. No barriers to learning was detected. The total time spent in the appointment was 40 minutes.     Truitt Merle, MD 09/21/2022   I, Wilburn Mylar, am acting as scribe for Truitt Merle, MD.   I have reviewed the above documentation for accuracy and completeness, and I agree with the above.

## 2022-09-21 NOTE — Progress Notes (Signed)
Per Dr Burr Medico, no treatment today, 500cc NS over 1 hr

## 2022-09-22 ENCOUNTER — Emergency Department (HOSPITAL_COMMUNITY)
Admission: EM | Admit: 2022-09-22 | Discharge: 2022-09-23 | Disposition: A | Discharge status: Home / Self Care | Payer: BC Managed Care – PPO | Attending: Emergency Medicine | Admitting: Emergency Medicine

## 2022-09-22 ENCOUNTER — Telehealth: Payer: Self-pay

## 2022-09-22 DIAGNOSIS — R251 Tremor, unspecified: Secondary | ICD-10-CM

## 2022-09-22 DIAGNOSIS — J90 Pleural effusion, not elsewhere classified: Secondary | ICD-10-CM | POA: Diagnosis not present

## 2022-09-22 DIAGNOSIS — R630 Anorexia: Secondary | ICD-10-CM | POA: Diagnosis not present

## 2022-09-22 DIAGNOSIS — Z853 Personal history of malignant neoplasm of breast: Secondary | ICD-10-CM | POA: Insufficient documentation

## 2022-09-22 DIAGNOSIS — J449 Chronic obstructive pulmonary disease, unspecified: Secondary | ICD-10-CM | POA: Insufficient documentation

## 2022-09-22 DIAGNOSIS — Z8505 Personal history of malignant neoplasm of liver: Secondary | ICD-10-CM | POA: Insufficient documentation

## 2022-09-22 DIAGNOSIS — F17210 Nicotine dependence, cigarettes, uncomplicated: Secondary | ICD-10-CM | POA: Insufficient documentation

## 2022-09-22 DIAGNOSIS — R531 Weakness: Secondary | ICD-10-CM | POA: Diagnosis not present

## 2022-09-22 DIAGNOSIS — Z9011 Acquired absence of right breast and nipple: Secondary | ICD-10-CM | POA: Diagnosis not present

## 2022-09-22 DIAGNOSIS — R791 Abnormal coagulation profile: Secondary | ICD-10-CM | POA: Diagnosis not present

## 2022-09-22 NOTE — Telephone Encounter (Signed)
LVM informing pt that pt's CXR done on 09/21/2022 does not show efficient amount of Rt Lower Lung fluid to require a thoracentesis.  Stated that [name] would like to hold the thoracentesis for now and want to see the pt back in clinic on 09/29/2022 for lab. F/u with Dr Burr Medico to go over MRI results.  Instructed pt to contact Dr. Ernestina Penna office should he have additional questions or concerns.

## 2022-09-23 ENCOUNTER — Other Ambulatory Visit: Payer: Self-pay

## 2022-09-23 ENCOUNTER — Emergency Department (HOSPITAL_COMMUNITY): Payer: BC Managed Care – PPO

## 2022-09-23 ENCOUNTER — Encounter (HOSPITAL_COMMUNITY): Payer: Self-pay | Admitting: Emergency Medicine

## 2022-09-23 DIAGNOSIS — R531 Weakness: Secondary | ICD-10-CM | POA: Diagnosis not present

## 2022-09-23 DIAGNOSIS — J90 Pleural effusion, not elsewhere classified: Secondary | ICD-10-CM | POA: Diagnosis not present

## 2022-09-23 LAB — COMPREHENSIVE METABOLIC PANEL
ALT: 19 U/L (ref 0–44)
AST: 31 U/L (ref 15–41)
Albumin: 2.5 g/dL — ABNORMAL LOW (ref 3.5–5.0)
Alkaline Phosphatase: 67 U/L (ref 38–126)
Anion gap: 6 (ref 5–15)
BUN: 19 mg/dL (ref 6–20)
CO2: 22 mmol/L (ref 22–32)
Calcium: 8.1 mg/dL — ABNORMAL LOW (ref 8.9–10.3)
Chloride: 100 mmol/L (ref 98–111)
Creatinine, Ser: 0.65 mg/dL (ref 0.44–1.00)
GFR, Estimated: >60 mL/min (ref >60)
Glucose, Bld: 113 mg/dL — ABNORMAL HIGH (ref 70–99)
Potassium: 4.2 mmol/L (ref 3.5–5.1)
Sodium: 128 mmol/L — ABNORMAL LOW (ref 135–145)
Total Bilirubin: 2.3 mg/dL — ABNORMAL HIGH (ref 0.3–1.2)
Total Protein: 5.8 g/dL — ABNORMAL LOW (ref 6.5–8.1)

## 2022-09-23 LAB — URINALYSIS, ROUTINE W REFLEX MICROSCOPIC
Bacteria, UA: NONE SEEN
Bilirubin Urine: NEGATIVE
Glucose, UA: NEGATIVE mg/dL
Hgb urine dipstick: NEGATIVE
Ketones, ur: 5 mg/dL — AB
Nitrite: NEGATIVE
Protein, ur: 100 mg/dL — AB
Specific Gravity, Urine: 1.03 (ref 1.005–1.030)
pH: 5 (ref 5.0–8.0)

## 2022-09-23 LAB — CBC
HCT: 31.8 % — ABNORMAL LOW (ref 36.0–46.0)
Hemoglobin: 10.3 g/dL — ABNORMAL LOW (ref 12.0–15.0)
MCH: 28.8 pg (ref 26.0–34.0)
MCHC: 32.4 g/dL (ref 30.0–36.0)
MCV: 88.8 fL (ref 80.0–100.0)
Platelets: 90 10*3/uL — ABNORMAL LOW (ref 150–400)
RBC: 3.58 MIL/uL — ABNORMAL LOW (ref 3.87–5.11)
WBC: 4.2 10*3/uL (ref 4.0–10.5)
nRBC: 0 % (ref 0.0–0.2)

## 2022-09-23 LAB — PROTIME-INR
INR: 1.5 — ABNORMAL HIGH (ref 0.8–1.2)
Prothrombin Time: 18.1 seconds — ABNORMAL HIGH (ref 11.4–15.2)

## 2022-09-23 LAB — LACTIC ACID, PLASMA: Lactic Acid, Venous: 1.4 mmol/L (ref 0.5–1.9)

## 2022-09-23 MED ORDER — BACLOFEN 10 MG PO TABS
10.0000 mg | ORAL_TABLET | Freq: Once | ORAL | Status: AC
  Administered 2022-09-23: 10 mg via ORAL
  Filled 2022-09-23: qty 1

## 2022-09-23 MED ORDER — LIDOCAINE-PRILOCAINE 2.5-2.5 % EX CREA
TOPICAL_CREAM | Freq: Once | CUTANEOUS | Status: AC
  Administered 2022-09-23: 1 via TOPICAL
  Filled 2022-09-23: qty 5

## 2022-09-23 MED ORDER — HEPARIN SOD (PORK) LOCK FLUSH 100 UNIT/ML IV SOLN
500.0000 [IU] | Freq: Once | INTRAVENOUS | Status: AC
  Administered 2022-09-23: 500 [IU]
  Filled 2022-09-23: qty 5

## 2022-09-23 NOTE — Discharge Instructions (Signed)
You were evaluated in the Emergency Department and after careful evaluation, we did not find any emergent condition requiring admission or further testing in the hospital.  Your exam/testing today was overall reassuring.  Recommend continued follow-up with your regular doctors to discuss your symptoms.  Please return to the Emergency Department if you experience any worsening of your condition.  Thank you for allowing [place] to be a part of your care.

## 2022-09-23 NOTE — ED Provider Notes (Signed)
[facility] Emergency Department Provider Note MRN:  [id number]  Arrival date & time: 09/23/22     Chief Complaint   Weakness   History of Present Illness   Theresa Rocha is a 56 y.o. year-old female with a history of hepatocellular carcinoma presenting to the ED with chief complaint of weakness.  Increased generalized weakness, jitteriness/tremulousness over the past few days.  Saw her oncologist a few days ago, due to weight loss they have paused her oral chemotherapy.  Poor appetite due to oral pain, started on Magic mouthwash.  Prescribe Xanax for anxiety.  Nothing seems to be helping.  Denies any pain anywhere, no fever recently.  Normal output from her pleural catheter.  Review of Systems  A thorough review of systems was obtained and all systems are negative except as noted in the HPI and PMH.   Patient's Health History    Past Medical History:  Diagnosis Date   Anemia    Breast cancer ([place]) 08/30/2019   Cancer ([place])    liver- just diagnosed, not started treatment yet   Cervicalgia    COPD (chronic obstructive pulmonary disease) (HCC)    Esophageal varices (HCC)    GERD (gastroesophageal reflux disease)    Headache    Hepatic cirrhosis (HCC)    Hepatitis C    resolved 2018   Hepatocellular carcinoma (HCC)    IBS (irritable bowel syndrome)    Lactose intolerance    Personal history of radiation therapy 11/2019   Right breast   Portal hypertension ([place])    Raynaud's syndrome    Thrombocytopenia ([place])     Past Surgical History:  Procedure Laterality Date   BREAST BIOPSY Right 09/2019   BREAST LUMPECTOMY Right 09/2019   BREAST LUMPECTOMY WITH AXILLARY LYMPH NODE BIOPSY Right 10/16/2019   Procedure: RIGHT BREAST LUMPECTOMY WITH SENTINEL LYMPH NODE BIOPSY;  Surgeon: Stark Klein, MD;  Location: [place] [facility];  Service: General;  Laterality: Right;   COLONOSCOPY     ESOPHAGEAL BANDING  10/27/2021   Procedure: ESOPHAGEAL BANDING;  Surgeon: Irene Shipper, MD;  [facility];  Service: Endoscopy;;   ESOPHAGOGASTRODUODENOSCOPY (EGD) WITH PROPOFOL N/A 10/27/2021   Procedure: ESOPHAGOGASTRODUODENOSCOPY (EGD) WITH PROPOFOL;  Surgeon: Irene Shipper, MD;  [facility];  Service: Endoscopy;  Laterality: N/A;   ESOPHAGOGASTRODUODENOSCOPY (EGD) WITH PROPOFOL N/A 01/04/2022   Procedure: ESOPHAGOGASTRODUODENOSCOPY (EGD) WITH PROPOFOL;  Surgeon: Irene Shipper, MD;  [facility];  Service: Endoscopy;  Laterality: N/A;   HAMMER TOE SURGERY Left 2012   pins placed in great toe, 2nd,3rd, 4th toes, all pins removed except great toe   IR ANGIOGRAM SELECTIVE EACH ADDITIONAL VESSEL  12/16/2021   IR ANGIOGRAM SELECTIVE EACH ADDITIONAL VESSEL  12/16/2021   IR ANGIOGRAM SELECTIVE EACH ADDITIONAL VESSEL  12/22/2021   IR ANGIOGRAM VISCERAL SELECTIVE  12/16/2021   IR ANGIOGRAM VISCERAL SELECTIVE  12/22/2021   IR EMBO TUMOR ORGAN ISCHEMIA INFARCT INC GUIDE ROADMAPPING  12/22/2021   IR IMAGING GUIDED PORT INSERTION  07/07/2022   IR PARACENTESIS  11/13/2019   IR PARACENTESIS  03/04/2022   IR PATIENT EVAL TECH 0-60 MINS  05/19/2022   IR PERC PLEURAL DRAIN W/INDWELL CATH W/IMG GUIDE  03/09/2022   IR PERC PLEURAL DRAIN W/INDWELL CATH W/IMG GUIDE  09/01/2022   IR RADIOLOGIST EVAL & MGMT  08/16/2019   IR RADIOLOGIST EVAL & MGMT  02/06/2020   IR RADIOLOGIST EVAL & MGMT  06/26/2020   IR RADIOLOGIST EVAL & MGMT  12/31/2020   IR RADIOLOGIST EVAL & MGMT  07/21/2021   IR RADIOLOGIST EVAL & MGMT  10/08/2021   IR RADIOLOGIST EVAL & MGMT  01/13/2022   IR RADIOLOGIST EVAL & MGMT  03/05/2022   IR RADIOLOGIST EVAL & MGMT  09/06/2022   IR THORACENTESIS ASP PLEURAL SPACE W/IMG GUIDE  10/12/2021   IR THORACENTESIS ASP PLEURAL SPACE W/IMG GUIDE  10/26/2021   IR THORACENTESIS ASP PLEURAL SPACE W/IMG GUIDE  11/19/2021   IR THORACENTESIS ASP PLEURAL SPACE W/IMG GUIDE  12/22/2021   IR US GUIDE VASC ACCESS RIGHT  12/16/2021   IR US GUIDE VASC ACCESS RIGHT  12/22/2021   PELVIC  LAPAROSCOPY Bilateral 1992   RADIOLOGY WITH ANESTHESIA N/A 09/16/2021   Procedure: CT MICROWAVE ABLATION;  Surgeon: Aletta Edouard, MD;  [facility];  Service: Radiology;  Laterality: N/A;   UPPER GASTROINTESTINAL ENDOSCOPY  2002   had esophageal dilitation    Family History  Problem Relation Age of Onset   Liver cancer Mother    Lung cancer Mother    Lung cancer Father    Liver cancer Maternal Grandmother    Colon cancer Neg Hx    Esophageal cancer Neg Hx    Stomach cancer Neg Hx    Rectal cancer Neg Hx     Social History   Socioeconomic History   Marital status: Married    Spouse name: Not on file   Number of children: Not on file   Years of education: Not on file   Highest education level: Not on file  Occupational History   Not on file  Tobacco Use   Smoking status: Every Day    Packs/day: 0.50    Years: 30.00    Total pack years: 15.00    Types: Cigarettes    Start date: 11/29/1978   Smokeless tobacco: Never  Vaping Use   Vaping Use: Never used  Substance and Sexual Activity   Alcohol use: Not Currently    Alcohol/week: 6.0 standard drinks of alcohol    Types: 6 Cans of beer per week    Comment: stopped 06/2019   Drug use: No   Sexual activity: Not Currently    Partners: Male  Other Topics Concern   Not on file  Social History Narrative   Not on file   Social Determinants of Health   Financial Resource Strain: Not on file  Food Insecurity: Not on file  Transportation Needs: Not on file  Physical Activity: Not on file  Stress: Not on file  Social Connections: Not on file  Intimate Partner Violence: Not on file     Physical Exam   Vitals:   09/23/22 0359 09/23/22 0415  BP:  97/67  Pulse:  88  Resp:  19  Temp: 97.6 F (36.4 C)   SpO2:  100%    CONSTITUTIONAL: Chronically ill-appearing, cachectic, NAD NEURO/PSYCH:  Alert and oriented x 3, no focal deficits, mild tremulousness EYES:  eyes equal and reactive ENT/NECK:  no LAD, no  JVD CARDIO: Tachycardic rate, well-perfused, normal S1 and S2 PULM:  CTAB no wheezing or rhonchi GI/GU:  non-distended, non-tender MSK/SPINE:  No gross deformities, no edema SKIN:  no rash, atraumatic   *Additional and/or pertinent findings included in MDM below  Diagnostic and Interventional Summary    EKG Interpretation  Date/Time:  Thursday September 23 2022 00:36:37 EDT Ventricular Rate:  108 PR Interval:  110 QRS Duration: 63 QT Interval:  389 QTC Calculation: 522 R Axis:   86  Text Interpretation: Sinus tachycardia Ventricular premature complex Nonspecific T abnrm, anterolateral leads Prolonged QT interval Confirmed by Gerlene Fee [phone]) on 09/23/2022 4:32:23 AM       Labs Reviewed  CBC - Abnormal; Notable for the following components:      Result Value   RBC 3.58 (*)    Hemoglobin 10.3 (*)    HCT 31.8 (*)    Platelets 90 (*)    All other components within normal limits  COMPREHENSIVE METABOLIC PANEL - Abnormal; Notable for the following components:   Sodium 128 (*)    Glucose, Bld 113 (*)    Calcium 8.1 (*)    Total Protein 5.8 (*)    Albumin 2.5 (*)    Total Bilirubin 2.3 (*)    All other components within normal limits  URINALYSIS, ROUTINE W REFLEX MICROSCOPIC - Abnormal; Notable for the following components:   Color, Urine AMBER (*)    APPearance HAZY (*)    Ketones, ur 5 (*)    Protein, ur 100 (*)    Leukocytes,Ua SMALL (*)    All other components within normal limits  PROTIME-INR - Abnormal; Notable for the following components:   Prothrombin Time 18.1 (*)    INR 1.5 (*)    All other components within normal limits  CULTURE, BLOOD (SINGLE)  LACTIC ACID, PLASMA    DG Chest Port 1 View  Final Result      Medications  heparin lock flush 100 unit/mL (has no administration in time range)  lidocaine-prilocaine (EMLA) cream (1 Application Topical Given 09/23/22 0105)  baclofen (LIORESAL) tablet 10 mg (10 mg Oral Given 09/23/22 0326)      Procedures  /  Critical Care Procedures  ED Course and Medical Decision Making  Initial Impression and Ddx Differential diagnosis includes electrolyte or metabolic disturbance (history of frequent hyponatremia), UTI, sepsis considered but felt to be less likely given the lack of infectious symptoms, no fever on arrival.  Past medical/surgical history that increases complexity of ED encounter: Hepatocellular carcinoma  Interpretation of Diagnostics I personally reviewed the laboratory assessment and my interpretation is as follows: No significant blood count or electrolyte disturbance, baseline hyponatremia, baseline severe hypoalbuminemia and low protein    Patient Reassessment and Ultimate Disposition/Management     Patient is more calm after home baclofen, vital signs remain at her baseline, no fever, urinalysis without evidence of infection, no indication for further testing or admission, appropriate for discharge.  Patient management required discussion with the following services or consulting groups:  None  Complexity of Problems Addressed Acute illness or injury that poses threat of life of bodily function  Additional Data Reviewed and Analyzed Further history obtained from: Further history from spouse/family member  Additional Factors Impacting ED Encounter Risk None  Barth Kirks. Sedonia Small, MD [place] [email or web address]  Final Clinical Impressions(s) / ED Diagnoses     ICD-10-CM   1. Weakness  R53.1     2. Tremulousness  R25.1       ED Discharge Orders     None        Discharge Instructions Discussed with and Provided to Patient:     Discharge Instructions      You were evaluated in the Emergency Department and after careful evaluation, we did not find any emergent condition requiring admission or further testing in the hospital.  Your exam/testing today was overall reassuring.  Recommend continued  follow-up with your regular doctors to discuss your symptoms.  Please return to the Emergency Department if you experience any worsening of your condition.  Thank you for allowing [place] to be a part of your care.        Maudie Flakes, MD 09/23/22 [phone]

## 2022-09-23 NOTE — ED Triage Notes (Addendum)
Pt c/o weakness and panic attacks since yesterday. Pt is taking oral chemo for liver cancer.

## 2022-09-24 ENCOUNTER — Other Ambulatory Visit: Payer: Self-pay

## 2022-09-24 DIAGNOSIS — R188 Other ascites: Secondary | ICD-10-CM | POA: Diagnosis not present

## 2022-09-24 DIAGNOSIS — Z452 Encounter for adjustment and management of vascular access device: Secondary | ICD-10-CM | POA: Diagnosis not present

## 2022-09-24 DIAGNOSIS — E43 Unspecified severe protein-calorie malnutrition: Secondary | ICD-10-CM | POA: Diagnosis not present

## 2022-09-24 DIAGNOSIS — J90 Pleural effusion, not elsewhere classified: Secondary | ICD-10-CM | POA: Diagnosis not present

## 2022-09-24 DIAGNOSIS — Z681 Body mass index (BMI) 19 or less, adult: Secondary | ICD-10-CM | POA: Diagnosis not present

## 2022-09-24 DIAGNOSIS — Z8619 Personal history of other infectious and parasitic diseases: Secondary | ICD-10-CM | POA: Diagnosis not present

## 2022-09-24 DIAGNOSIS — Z8639 Personal history of other endocrine, nutritional and metabolic disease: Secondary | ICD-10-CM | POA: Diagnosis not present

## 2022-09-24 DIAGNOSIS — C782 Secondary malignant neoplasm of pleura: Secondary | ICD-10-CM | POA: Diagnosis not present

## 2022-09-24 DIAGNOSIS — D696 Thrombocytopenia, unspecified: Secondary | ICD-10-CM | POA: Diagnosis not present

## 2022-09-24 DIAGNOSIS — Z438 Encounter for attention to other artificial openings: Secondary | ICD-10-CM | POA: Diagnosis not present

## 2022-09-24 DIAGNOSIS — R161 Splenomegaly, not elsewhere classified: Secondary | ICD-10-CM | POA: Diagnosis not present

## 2022-09-24 DIAGNOSIS — D61818 Other pancytopenia: Secondary | ICD-10-CM | POA: Diagnosis not present

## 2022-09-24 DIAGNOSIS — C220 Liver cell carcinoma: Secondary | ICD-10-CM | POA: Diagnosis not present

## 2022-09-24 DIAGNOSIS — K766 Portal hypertension: Secondary | ICD-10-CM | POA: Diagnosis not present

## 2022-09-24 DIAGNOSIS — I700 Atherosclerosis of aorta: Secondary | ICD-10-CM | POA: Diagnosis not present

## 2022-09-24 DIAGNOSIS — C50111 Malignant neoplasm of central portion of right female breast: Secondary | ICD-10-CM | POA: Diagnosis not present

## 2022-09-25 ENCOUNTER — Ambulatory Visit (HOSPITAL_COMMUNITY): Payer: BC Managed Care – PPO

## 2022-09-25 DIAGNOSIS — R188 Other ascites: Secondary | ICD-10-CM | POA: Diagnosis not present

## 2022-09-25 DIAGNOSIS — R161 Splenomegaly, not elsewhere classified: Secondary | ICD-10-CM | POA: Diagnosis not present

## 2022-09-25 DIAGNOSIS — Z438 Encounter for attention to other artificial openings: Secondary | ICD-10-CM | POA: Diagnosis not present

## 2022-09-25 DIAGNOSIS — K766 Portal hypertension: Secondary | ICD-10-CM | POA: Diagnosis not present

## 2022-09-25 DIAGNOSIS — D61818 Other pancytopenia: Secondary | ICD-10-CM | POA: Diagnosis not present

## 2022-09-25 DIAGNOSIS — C782 Secondary malignant neoplasm of pleura: Secondary | ICD-10-CM | POA: Diagnosis not present

## 2022-09-25 DIAGNOSIS — Z8639 Personal history of other endocrine, nutritional and metabolic disease: Secondary | ICD-10-CM | POA: Diagnosis not present

## 2022-09-25 DIAGNOSIS — J90 Pleural effusion, not elsewhere classified: Secondary | ICD-10-CM | POA: Diagnosis not present

## 2022-09-25 DIAGNOSIS — C50111 Malignant neoplasm of central portion of right female breast: Secondary | ICD-10-CM | POA: Diagnosis not present

## 2022-09-25 DIAGNOSIS — Z8619 Personal history of other infectious and parasitic diseases: Secondary | ICD-10-CM | POA: Diagnosis not present

## 2022-09-25 DIAGNOSIS — C220 Liver cell carcinoma: Secondary | ICD-10-CM | POA: Diagnosis not present

## 2022-09-25 DIAGNOSIS — Z452 Encounter for adjustment and management of vascular access device: Secondary | ICD-10-CM | POA: Diagnosis not present

## 2022-09-25 DIAGNOSIS — E43 Unspecified severe protein-calorie malnutrition: Secondary | ICD-10-CM | POA: Diagnosis not present

## 2022-09-25 DIAGNOSIS — D696 Thrombocytopenia, unspecified: Secondary | ICD-10-CM | POA: Diagnosis not present

## 2022-09-25 DIAGNOSIS — Z681 Body mass index (BMI) 19 or less, adult: Secondary | ICD-10-CM | POA: Diagnosis not present

## 2022-09-25 DIAGNOSIS — I700 Atherosclerosis of aorta: Secondary | ICD-10-CM | POA: Diagnosis not present

## 2022-09-26 DIAGNOSIS — K766 Portal hypertension: Secondary | ICD-10-CM | POA: Diagnosis not present

## 2022-09-26 DIAGNOSIS — Z438 Encounter for attention to other artificial openings: Secondary | ICD-10-CM | POA: Diagnosis not present

## 2022-09-26 DIAGNOSIS — I700 Atherosclerosis of aorta: Secondary | ICD-10-CM | POA: Diagnosis not present

## 2022-09-26 DIAGNOSIS — R188 Other ascites: Secondary | ICD-10-CM | POA: Diagnosis not present

## 2022-09-26 DIAGNOSIS — C220 Liver cell carcinoma: Secondary | ICD-10-CM | POA: Diagnosis not present

## 2022-09-26 DIAGNOSIS — D61818 Other pancytopenia: Secondary | ICD-10-CM | POA: Diagnosis not present

## 2022-09-26 DIAGNOSIS — Z452 Encounter for adjustment and management of vascular access device: Secondary | ICD-10-CM | POA: Diagnosis not present

## 2022-09-26 DIAGNOSIS — Z8639 Personal history of other endocrine, nutritional and metabolic disease: Secondary | ICD-10-CM | POA: Diagnosis not present

## 2022-09-26 DIAGNOSIS — J90 Pleural effusion, not elsewhere classified: Secondary | ICD-10-CM | POA: Diagnosis not present

## 2022-09-26 DIAGNOSIS — R161 Splenomegaly, not elsewhere classified: Secondary | ICD-10-CM | POA: Diagnosis not present

## 2022-09-26 DIAGNOSIS — Z681 Body mass index (BMI) 19 or less, adult: Secondary | ICD-10-CM | POA: Diagnosis not present

## 2022-09-26 DIAGNOSIS — C50111 Malignant neoplasm of central portion of right female breast: Secondary | ICD-10-CM | POA: Diagnosis not present

## 2022-09-26 DIAGNOSIS — E43 Unspecified severe protein-calorie malnutrition: Secondary | ICD-10-CM | POA: Diagnosis not present

## 2022-09-26 DIAGNOSIS — C782 Secondary malignant neoplasm of pleura: Secondary | ICD-10-CM | POA: Diagnosis not present

## 2022-09-26 DIAGNOSIS — Z8619 Personal history of other infectious and parasitic diseases: Secondary | ICD-10-CM | POA: Diagnosis not present

## 2022-09-26 DIAGNOSIS — D696 Thrombocytopenia, unspecified: Secondary | ICD-10-CM | POA: Diagnosis not present

## 2022-09-27 DIAGNOSIS — J90 Pleural effusion, not elsewhere classified: Secondary | ICD-10-CM | POA: Diagnosis not present

## 2022-09-27 DIAGNOSIS — C782 Secondary malignant neoplasm of pleura: Secondary | ICD-10-CM | POA: Diagnosis not present

## 2022-09-27 DIAGNOSIS — C50111 Malignant neoplasm of central portion of right female breast: Secondary | ICD-10-CM | POA: Diagnosis not present

## 2022-09-27 DIAGNOSIS — I700 Atherosclerosis of aorta: Secondary | ICD-10-CM | POA: Diagnosis not present

## 2022-09-27 DIAGNOSIS — R188 Other ascites: Secondary | ICD-10-CM | POA: Diagnosis not present

## 2022-09-27 DIAGNOSIS — Z681 Body mass index (BMI) 19 or less, adult: Secondary | ICD-10-CM | POA: Diagnosis not present

## 2022-09-27 DIAGNOSIS — Z8639 Personal history of other endocrine, nutritional and metabolic disease: Secondary | ICD-10-CM | POA: Diagnosis not present

## 2022-09-27 DIAGNOSIS — E43 Unspecified severe protein-calorie malnutrition: Secondary | ICD-10-CM | POA: Diagnosis not present

## 2022-09-27 DIAGNOSIS — Z438 Encounter for attention to other artificial openings: Secondary | ICD-10-CM | POA: Diagnosis not present

## 2022-09-27 DIAGNOSIS — D61818 Other pancytopenia: Secondary | ICD-10-CM | POA: Diagnosis not present

## 2022-09-27 DIAGNOSIS — K766 Portal hypertension: Secondary | ICD-10-CM | POA: Diagnosis not present

## 2022-09-27 DIAGNOSIS — C220 Liver cell carcinoma: Secondary | ICD-10-CM | POA: Diagnosis not present

## 2022-09-27 DIAGNOSIS — Z452 Encounter for adjustment and management of vascular access device: Secondary | ICD-10-CM | POA: Diagnosis not present

## 2022-09-27 DIAGNOSIS — Z8619 Personal history of other infectious and parasitic diseases: Secondary | ICD-10-CM | POA: Diagnosis not present

## 2022-09-27 DIAGNOSIS — D696 Thrombocytopenia, unspecified: Secondary | ICD-10-CM | POA: Diagnosis not present

## 2022-09-27 DIAGNOSIS — R161 Splenomegaly, not elsewhere classified: Secondary | ICD-10-CM | POA: Diagnosis not present

## 2022-09-28 DIAGNOSIS — Z438 Encounter for attention to other artificial openings: Secondary | ICD-10-CM | POA: Diagnosis not present

## 2022-09-28 DIAGNOSIS — Z8619 Personal history of other infectious and parasitic diseases: Secondary | ICD-10-CM | POA: Diagnosis not present

## 2022-09-28 DIAGNOSIS — J90 Pleural effusion, not elsewhere classified: Secondary | ICD-10-CM | POA: Diagnosis not present

## 2022-09-28 DIAGNOSIS — C782 Secondary malignant neoplasm of pleura: Secondary | ICD-10-CM | POA: Diagnosis not present

## 2022-09-28 DIAGNOSIS — R161 Splenomegaly, not elsewhere classified: Secondary | ICD-10-CM | POA: Diagnosis not present

## 2022-09-28 DIAGNOSIS — I700 Atherosclerosis of aorta: Secondary | ICD-10-CM | POA: Diagnosis not present

## 2022-09-28 DIAGNOSIS — C220 Liver cell carcinoma: Secondary | ICD-10-CM | POA: Diagnosis not present

## 2022-09-28 DIAGNOSIS — Z452 Encounter for adjustment and management of vascular access device: Secondary | ICD-10-CM | POA: Diagnosis not present

## 2022-09-28 DIAGNOSIS — R188 Other ascites: Secondary | ICD-10-CM | POA: Diagnosis not present

## 2022-09-28 DIAGNOSIS — C50111 Malignant neoplasm of central portion of right female breast: Secondary | ICD-10-CM | POA: Diagnosis not present

## 2022-09-28 DIAGNOSIS — Z8639 Personal history of other endocrine, nutritional and metabolic disease: Secondary | ICD-10-CM | POA: Diagnosis not present

## 2022-09-28 DIAGNOSIS — E43 Unspecified severe protein-calorie malnutrition: Secondary | ICD-10-CM | POA: Diagnosis not present

## 2022-09-28 DIAGNOSIS — K766 Portal hypertension: Secondary | ICD-10-CM | POA: Diagnosis not present

## 2022-09-28 DIAGNOSIS — D696 Thrombocytopenia, unspecified: Secondary | ICD-10-CM | POA: Diagnosis not present

## 2022-09-28 DIAGNOSIS — Z681 Body mass index (BMI) 19 or less, adult: Secondary | ICD-10-CM | POA: Diagnosis not present

## 2022-09-28 DIAGNOSIS — D61818 Other pancytopenia: Secondary | ICD-10-CM | POA: Diagnosis not present

## 2022-09-28 LAB — CULTURE, BLOOD (SINGLE)
Culture: NO GROWTH
Special Requests: ADEQUATE

## 2022-09-29 ENCOUNTER — Other Ambulatory Visit: Payer: BC Managed Care – PPO

## 2022-09-29 ENCOUNTER — Ambulatory Visit: Payer: BC Managed Care – PPO | Admitting: Hematology

## 2022-09-29 DIAGNOSIS — Z452 Encounter for adjustment and management of vascular access device: Secondary | ICD-10-CM | POA: Diagnosis not present

## 2022-09-29 DIAGNOSIS — I700 Atherosclerosis of aorta: Secondary | ICD-10-CM | POA: Diagnosis not present

## 2022-09-29 DIAGNOSIS — R188 Other ascites: Secondary | ICD-10-CM | POA: Diagnosis not present

## 2022-09-29 DIAGNOSIS — D696 Thrombocytopenia, unspecified: Secondary | ICD-10-CM | POA: Diagnosis not present

## 2022-09-29 DIAGNOSIS — Z681 Body mass index (BMI) 19 or less, adult: Secondary | ICD-10-CM | POA: Diagnosis not present

## 2022-09-29 DIAGNOSIS — C50111 Malignant neoplasm of central portion of right female breast: Secondary | ICD-10-CM | POA: Diagnosis not present

## 2022-09-29 DIAGNOSIS — E43 Unspecified severe protein-calorie malnutrition: Secondary | ICD-10-CM | POA: Diagnosis not present

## 2022-09-29 DIAGNOSIS — Z8619 Personal history of other infectious and parasitic diseases: Secondary | ICD-10-CM | POA: Diagnosis not present

## 2022-09-29 DIAGNOSIS — D61818 Other pancytopenia: Secondary | ICD-10-CM | POA: Diagnosis not present

## 2022-09-29 DIAGNOSIS — Z438 Encounter for attention to other artificial openings: Secondary | ICD-10-CM | POA: Diagnosis not present

## 2022-09-29 DIAGNOSIS — J90 Pleural effusion, not elsewhere classified: Secondary | ICD-10-CM | POA: Diagnosis not present

## 2022-09-29 DIAGNOSIS — K766 Portal hypertension: Secondary | ICD-10-CM | POA: Diagnosis not present

## 2022-09-29 DIAGNOSIS — C782 Secondary malignant neoplasm of pleura: Secondary | ICD-10-CM | POA: Diagnosis not present

## 2022-09-29 DIAGNOSIS — Z8639 Personal history of other endocrine, nutritional and metabolic disease: Secondary | ICD-10-CM | POA: Diagnosis not present

## 2022-09-29 DIAGNOSIS — C220 Liver cell carcinoma: Secondary | ICD-10-CM | POA: Diagnosis not present

## 2022-09-29 DIAGNOSIS — R161 Splenomegaly, not elsewhere classified: Secondary | ICD-10-CM | POA: Diagnosis not present

## 2022-09-30 DIAGNOSIS — Z452 Encounter for adjustment and management of vascular access device: Secondary | ICD-10-CM | POA: Diagnosis not present

## 2022-09-30 DIAGNOSIS — Z681 Body mass index (BMI) 19 or less, adult: Secondary | ICD-10-CM | POA: Diagnosis not present

## 2022-09-30 DIAGNOSIS — I700 Atherosclerosis of aorta: Secondary | ICD-10-CM | POA: Diagnosis not present

## 2022-09-30 DIAGNOSIS — C782 Secondary malignant neoplasm of pleura: Secondary | ICD-10-CM | POA: Diagnosis not present

## 2022-09-30 DIAGNOSIS — C220 Liver cell carcinoma: Secondary | ICD-10-CM | POA: Diagnosis not present

## 2022-09-30 DIAGNOSIS — Z8619 Personal history of other infectious and parasitic diseases: Secondary | ICD-10-CM | POA: Diagnosis not present

## 2022-09-30 DIAGNOSIS — Z8639 Personal history of other endocrine, nutritional and metabolic disease: Secondary | ICD-10-CM | POA: Diagnosis not present

## 2022-09-30 DIAGNOSIS — D61818 Other pancytopenia: Secondary | ICD-10-CM | POA: Diagnosis not present

## 2022-09-30 DIAGNOSIS — R188 Other ascites: Secondary | ICD-10-CM | POA: Diagnosis not present

## 2022-09-30 DIAGNOSIS — R161 Splenomegaly, not elsewhere classified: Secondary | ICD-10-CM | POA: Diagnosis not present

## 2022-09-30 DIAGNOSIS — E43 Unspecified severe protein-calorie malnutrition: Secondary | ICD-10-CM | POA: Diagnosis not present

## 2022-09-30 DIAGNOSIS — Z438 Encounter for attention to other artificial openings: Secondary | ICD-10-CM | POA: Diagnosis not present

## 2022-09-30 DIAGNOSIS — C50111 Malignant neoplasm of central portion of right female breast: Secondary | ICD-10-CM | POA: Diagnosis not present

## 2022-09-30 DIAGNOSIS — D696 Thrombocytopenia, unspecified: Secondary | ICD-10-CM | POA: Diagnosis not present

## 2022-09-30 DIAGNOSIS — J90 Pleural effusion, not elsewhere classified: Secondary | ICD-10-CM | POA: Diagnosis not present

## 2022-09-30 DIAGNOSIS — K766 Portal hypertension: Secondary | ICD-10-CM | POA: Diagnosis not present

## 2022-10-01 DIAGNOSIS — C220 Liver cell carcinoma: Secondary | ICD-10-CM | POA: Diagnosis not present

## 2022-10-01 DIAGNOSIS — R188 Other ascites: Secondary | ICD-10-CM | POA: Diagnosis not present

## 2022-10-01 DIAGNOSIS — Z8639 Personal history of other endocrine, nutritional and metabolic disease: Secondary | ICD-10-CM | POA: Diagnosis not present

## 2022-10-01 DIAGNOSIS — D61818 Other pancytopenia: Secondary | ICD-10-CM | POA: Diagnosis not present

## 2022-10-01 DIAGNOSIS — K766 Portal hypertension: Secondary | ICD-10-CM | POA: Diagnosis not present

## 2022-10-01 DIAGNOSIS — I700 Atherosclerosis of aorta: Secondary | ICD-10-CM | POA: Diagnosis not present

## 2022-10-01 DIAGNOSIS — C50111 Malignant neoplasm of central portion of right female breast: Secondary | ICD-10-CM | POA: Diagnosis not present

## 2022-10-01 DIAGNOSIS — R161 Splenomegaly, not elsewhere classified: Secondary | ICD-10-CM | POA: Diagnosis not present

## 2022-10-01 DIAGNOSIS — Z681 Body mass index (BMI) 19 or less, adult: Secondary | ICD-10-CM | POA: Diagnosis not present

## 2022-10-01 DIAGNOSIS — Z8619 Personal history of other infectious and parasitic diseases: Secondary | ICD-10-CM | POA: Diagnosis not present

## 2022-10-01 DIAGNOSIS — Z438 Encounter for attention to other artificial openings: Secondary | ICD-10-CM | POA: Diagnosis not present

## 2022-10-01 DIAGNOSIS — J90 Pleural effusion, not elsewhere classified: Secondary | ICD-10-CM | POA: Diagnosis not present

## 2022-10-01 DIAGNOSIS — D696 Thrombocytopenia, unspecified: Secondary | ICD-10-CM | POA: Diagnosis not present

## 2022-10-01 DIAGNOSIS — E43 Unspecified severe protein-calorie malnutrition: Secondary | ICD-10-CM | POA: Diagnosis not present

## 2022-10-01 DIAGNOSIS — Z452 Encounter for adjustment and management of vascular access device: Secondary | ICD-10-CM | POA: Diagnosis not present

## 2022-10-01 DIAGNOSIS — C782 Secondary malignant neoplasm of pleura: Secondary | ICD-10-CM | POA: Diagnosis not present

## 2022-10-02 DIAGNOSIS — C782 Secondary malignant neoplasm of pleura: Secondary | ICD-10-CM | POA: Diagnosis not present

## 2022-10-02 DIAGNOSIS — R161 Splenomegaly, not elsewhere classified: Secondary | ICD-10-CM | POA: Diagnosis not present

## 2022-10-02 DIAGNOSIS — Z8639 Personal history of other endocrine, nutritional and metabolic disease: Secondary | ICD-10-CM | POA: Diagnosis not present

## 2022-10-02 DIAGNOSIS — J90 Pleural effusion, not elsewhere classified: Secondary | ICD-10-CM | POA: Diagnosis not present

## 2022-10-02 DIAGNOSIS — C220 Liver cell carcinoma: Secondary | ICD-10-CM | POA: Diagnosis not present

## 2022-10-02 DIAGNOSIS — Z681 Body mass index (BMI) 19 or less, adult: Secondary | ICD-10-CM | POA: Diagnosis not present

## 2022-10-02 DIAGNOSIS — K766 Portal hypertension: Secondary | ICD-10-CM | POA: Diagnosis not present

## 2022-10-02 DIAGNOSIS — R188 Other ascites: Secondary | ICD-10-CM | POA: Diagnosis not present

## 2022-10-02 DIAGNOSIS — E43 Unspecified severe protein-calorie malnutrition: Secondary | ICD-10-CM | POA: Diagnosis not present

## 2022-10-02 DIAGNOSIS — D61818 Other pancytopenia: Secondary | ICD-10-CM | POA: Diagnosis not present

## 2022-10-02 DIAGNOSIS — Z438 Encounter for attention to other artificial openings: Secondary | ICD-10-CM | POA: Diagnosis not present

## 2022-10-02 DIAGNOSIS — Z452 Encounter for adjustment and management of vascular access device: Secondary | ICD-10-CM | POA: Diagnosis not present

## 2022-10-02 DIAGNOSIS — I700 Atherosclerosis of aorta: Secondary | ICD-10-CM | POA: Diagnosis not present

## 2022-10-02 DIAGNOSIS — Z8619 Personal history of other infectious and parasitic diseases: Secondary | ICD-10-CM | POA: Diagnosis not present

## 2022-10-02 DIAGNOSIS — D696 Thrombocytopenia, unspecified: Secondary | ICD-10-CM | POA: Diagnosis not present

## 2022-10-02 DIAGNOSIS — C50111 Malignant neoplasm of central portion of right female breast: Secondary | ICD-10-CM | POA: Diagnosis not present

## 2022-10-03 DIAGNOSIS — Z452 Encounter for adjustment and management of vascular access device: Secondary | ICD-10-CM | POA: Diagnosis not present

## 2022-10-03 DIAGNOSIS — R161 Splenomegaly, not elsewhere classified: Secondary | ICD-10-CM | POA: Diagnosis not present

## 2022-10-03 DIAGNOSIS — D61818 Other pancytopenia: Secondary | ICD-10-CM | POA: Diagnosis not present

## 2022-10-03 DIAGNOSIS — Z8639 Personal history of other endocrine, nutritional and metabolic disease: Secondary | ICD-10-CM | POA: Diagnosis not present

## 2022-10-03 DIAGNOSIS — Z8619 Personal history of other infectious and parasitic diseases: Secondary | ICD-10-CM | POA: Diagnosis not present

## 2022-10-03 DIAGNOSIS — C782 Secondary malignant neoplasm of pleura: Secondary | ICD-10-CM | POA: Diagnosis not present

## 2022-10-03 DIAGNOSIS — J90 Pleural effusion, not elsewhere classified: Secondary | ICD-10-CM | POA: Diagnosis not present

## 2022-10-03 DIAGNOSIS — I700 Atherosclerosis of aorta: Secondary | ICD-10-CM | POA: Diagnosis not present

## 2022-10-03 DIAGNOSIS — Z681 Body mass index (BMI) 19 or less, adult: Secondary | ICD-10-CM | POA: Diagnosis not present

## 2022-10-03 DIAGNOSIS — K766 Portal hypertension: Secondary | ICD-10-CM | POA: Diagnosis not present

## 2022-10-03 DIAGNOSIS — E43 Unspecified severe protein-calorie malnutrition: Secondary | ICD-10-CM | POA: Diagnosis not present

## 2022-10-03 DIAGNOSIS — C50111 Malignant neoplasm of central portion of right female breast: Secondary | ICD-10-CM | POA: Diagnosis not present

## 2022-10-03 DIAGNOSIS — R188 Other ascites: Secondary | ICD-10-CM | POA: Diagnosis not present

## 2022-10-03 DIAGNOSIS — C220 Liver cell carcinoma: Secondary | ICD-10-CM | POA: Diagnosis not present

## 2022-10-03 DIAGNOSIS — Z438 Encounter for attention to other artificial openings: Secondary | ICD-10-CM | POA: Diagnosis not present

## 2022-10-03 DIAGNOSIS — D696 Thrombocytopenia, unspecified: Secondary | ICD-10-CM | POA: Diagnosis not present

## 2022-10-04 DIAGNOSIS — J90 Pleural effusion, not elsewhere classified: Secondary | ICD-10-CM | POA: Diagnosis not present

## 2022-10-04 DIAGNOSIS — D61818 Other pancytopenia: Secondary | ICD-10-CM | POA: Diagnosis not present

## 2022-10-04 DIAGNOSIS — C50111 Malignant neoplasm of central portion of right female breast: Secondary | ICD-10-CM | POA: Diagnosis not present

## 2022-10-04 DIAGNOSIS — E43 Unspecified severe protein-calorie malnutrition: Secondary | ICD-10-CM | POA: Diagnosis not present

## 2022-10-04 DIAGNOSIS — I700 Atherosclerosis of aorta: Secondary | ICD-10-CM | POA: Diagnosis not present

## 2022-10-04 DIAGNOSIS — Z681 Body mass index (BMI) 19 or less, adult: Secondary | ICD-10-CM | POA: Diagnosis not present

## 2022-10-04 DIAGNOSIS — Z8639 Personal history of other endocrine, nutritional and metabolic disease: Secondary | ICD-10-CM | POA: Diagnosis not present

## 2022-10-04 DIAGNOSIS — Z8619 Personal history of other infectious and parasitic diseases: Secondary | ICD-10-CM | POA: Diagnosis not present

## 2022-10-04 DIAGNOSIS — Z452 Encounter for adjustment and management of vascular access device: Secondary | ICD-10-CM | POA: Diagnosis not present

## 2022-10-04 DIAGNOSIS — D696 Thrombocytopenia, unspecified: Secondary | ICD-10-CM | POA: Diagnosis not present

## 2022-10-04 DIAGNOSIS — C782 Secondary malignant neoplasm of pleura: Secondary | ICD-10-CM | POA: Diagnosis not present

## 2022-10-04 DIAGNOSIS — Z438 Encounter for attention to other artificial openings: Secondary | ICD-10-CM | POA: Diagnosis not present

## 2022-10-04 DIAGNOSIS — R188 Other ascites: Secondary | ICD-10-CM | POA: Diagnosis not present

## 2022-10-04 DIAGNOSIS — K766 Portal hypertension: Secondary | ICD-10-CM | POA: Diagnosis not present

## 2022-10-04 DIAGNOSIS — R161 Splenomegaly, not elsewhere classified: Secondary | ICD-10-CM | POA: Diagnosis not present

## 2022-10-04 DIAGNOSIS — C220 Liver cell carcinoma: Secondary | ICD-10-CM | POA: Diagnosis not present

## 2022-10-05 DIAGNOSIS — D61818 Other pancytopenia: Secondary | ICD-10-CM | POA: Diagnosis not present

## 2022-10-05 DIAGNOSIS — C220 Liver cell carcinoma: Secondary | ICD-10-CM | POA: Diagnosis not present

## 2022-10-05 DIAGNOSIS — Z438 Encounter for attention to other artificial openings: Secondary | ICD-10-CM | POA: Diagnosis not present

## 2022-10-05 DIAGNOSIS — C50111 Malignant neoplasm of central portion of right female breast: Secondary | ICD-10-CM | POA: Diagnosis not present

## 2022-10-05 DIAGNOSIS — R161 Splenomegaly, not elsewhere classified: Secondary | ICD-10-CM | POA: Diagnosis not present

## 2022-10-05 DIAGNOSIS — C782 Secondary malignant neoplasm of pleura: Secondary | ICD-10-CM | POA: Diagnosis not present

## 2022-10-05 DIAGNOSIS — J90 Pleural effusion, not elsewhere classified: Secondary | ICD-10-CM | POA: Diagnosis not present

## 2022-10-05 DIAGNOSIS — I700 Atherosclerosis of aorta: Secondary | ICD-10-CM | POA: Diagnosis not present

## 2022-10-05 DIAGNOSIS — Z8619 Personal history of other infectious and parasitic diseases: Secondary | ICD-10-CM | POA: Diagnosis not present

## 2022-10-05 DIAGNOSIS — Z8639 Personal history of other endocrine, nutritional and metabolic disease: Secondary | ICD-10-CM | POA: Diagnosis not present

## 2022-10-05 DIAGNOSIS — K766 Portal hypertension: Secondary | ICD-10-CM | POA: Diagnosis not present

## 2022-10-05 DIAGNOSIS — E43 Unspecified severe protein-calorie malnutrition: Secondary | ICD-10-CM | POA: Diagnosis not present

## 2022-10-05 DIAGNOSIS — Z452 Encounter for adjustment and management of vascular access device: Secondary | ICD-10-CM | POA: Diagnosis not present

## 2022-10-05 DIAGNOSIS — D696 Thrombocytopenia, unspecified: Secondary | ICD-10-CM | POA: Diagnosis not present

## 2022-10-05 DIAGNOSIS — Z681 Body mass index (BMI) 19 or less, adult: Secondary | ICD-10-CM | POA: Diagnosis not present

## 2022-10-05 DIAGNOSIS — R188 Other ascites: Secondary | ICD-10-CM | POA: Diagnosis not present

## 2022-10-06 DIAGNOSIS — Z8619 Personal history of other infectious and parasitic diseases: Secondary | ICD-10-CM | POA: Diagnosis not present

## 2022-10-06 DIAGNOSIS — Z452 Encounter for adjustment and management of vascular access device: Secondary | ICD-10-CM | POA: Diagnosis not present

## 2022-10-06 DIAGNOSIS — D696 Thrombocytopenia, unspecified: Secondary | ICD-10-CM | POA: Diagnosis not present

## 2022-10-06 DIAGNOSIS — C50111 Malignant neoplasm of central portion of right female breast: Secondary | ICD-10-CM | POA: Diagnosis not present

## 2022-10-06 DIAGNOSIS — Z681 Body mass index (BMI) 19 or less, adult: Secondary | ICD-10-CM | POA: Diagnosis not present

## 2022-10-06 DIAGNOSIS — C220 Liver cell carcinoma: Secondary | ICD-10-CM | POA: Diagnosis not present

## 2022-10-06 DIAGNOSIS — C782 Secondary malignant neoplasm of pleura: Secondary | ICD-10-CM | POA: Diagnosis not present

## 2022-10-06 DIAGNOSIS — J90 Pleural effusion, not elsewhere classified: Secondary | ICD-10-CM | POA: Diagnosis not present

## 2022-10-06 DIAGNOSIS — R188 Other ascites: Secondary | ICD-10-CM | POA: Diagnosis not present

## 2022-10-06 DIAGNOSIS — K766 Portal hypertension: Secondary | ICD-10-CM | POA: Diagnosis not present

## 2022-10-06 DIAGNOSIS — Z8639 Personal history of other endocrine, nutritional and metabolic disease: Secondary | ICD-10-CM | POA: Diagnosis not present

## 2022-10-06 DIAGNOSIS — C50919 Malignant neoplasm of unspecified site of unspecified female breast: Secondary | ICD-10-CM | POA: Diagnosis not present

## 2022-10-06 DIAGNOSIS — E43 Unspecified severe protein-calorie malnutrition: Secondary | ICD-10-CM | POA: Diagnosis not present

## 2022-10-06 DIAGNOSIS — R180 Malignant ascites: Secondary | ICD-10-CM | POA: Diagnosis not present

## 2022-10-06 DIAGNOSIS — R161 Splenomegaly, not elsewhere classified: Secondary | ICD-10-CM | POA: Diagnosis not present

## 2022-10-06 DIAGNOSIS — D61818 Other pancytopenia: Secondary | ICD-10-CM | POA: Diagnosis not present

## 2022-10-06 DIAGNOSIS — Z438 Encounter for attention to other artificial openings: Secondary | ICD-10-CM | POA: Diagnosis not present

## 2022-10-06 DIAGNOSIS — I700 Atherosclerosis of aorta: Secondary | ICD-10-CM | POA: Diagnosis not present

## 2022-10-07 DIAGNOSIS — I700 Atherosclerosis of aorta: Secondary | ICD-10-CM | POA: Diagnosis not present

## 2022-10-07 DIAGNOSIS — Z452 Encounter for adjustment and management of vascular access device: Secondary | ICD-10-CM | POA: Diagnosis not present

## 2022-10-07 DIAGNOSIS — Z681 Body mass index (BMI) 19 or less, adult: Secondary | ICD-10-CM | POA: Diagnosis not present

## 2022-10-07 DIAGNOSIS — R188 Other ascites: Secondary | ICD-10-CM | POA: Diagnosis not present

## 2022-10-07 DIAGNOSIS — D696 Thrombocytopenia, unspecified: Secondary | ICD-10-CM | POA: Diagnosis not present

## 2022-10-07 DIAGNOSIS — Z438 Encounter for attention to other artificial openings: Secondary | ICD-10-CM | POA: Diagnosis not present

## 2022-10-07 DIAGNOSIS — K766 Portal hypertension: Secondary | ICD-10-CM | POA: Diagnosis not present

## 2022-10-07 DIAGNOSIS — C782 Secondary malignant neoplasm of pleura: Secondary | ICD-10-CM | POA: Diagnosis not present

## 2022-10-07 DIAGNOSIS — D61818 Other pancytopenia: Secondary | ICD-10-CM | POA: Diagnosis not present

## 2022-10-07 DIAGNOSIS — R161 Splenomegaly, not elsewhere classified: Secondary | ICD-10-CM | POA: Diagnosis not present

## 2022-10-07 DIAGNOSIS — C220 Liver cell carcinoma: Secondary | ICD-10-CM | POA: Diagnosis not present

## 2022-10-07 DIAGNOSIS — J90 Pleural effusion, not elsewhere classified: Secondary | ICD-10-CM | POA: Diagnosis not present

## 2022-10-07 DIAGNOSIS — E43 Unspecified severe protein-calorie malnutrition: Secondary | ICD-10-CM | POA: Diagnosis not present

## 2022-10-07 DIAGNOSIS — Z8639 Personal history of other endocrine, nutritional and metabolic disease: Secondary | ICD-10-CM | POA: Diagnosis not present

## 2022-10-07 DIAGNOSIS — Z8619 Personal history of other infectious and parasitic diseases: Secondary | ICD-10-CM | POA: Diagnosis not present

## 2022-10-07 DIAGNOSIS — C50111 Malignant neoplasm of central portion of right female breast: Secondary | ICD-10-CM | POA: Diagnosis not present

## 2022-10-08 ENCOUNTER — Ambulatory Visit: Payer: BC Managed Care – PPO | Admitting: Hematology

## 2022-10-08 ENCOUNTER — Other Ambulatory Visit: Payer: BC Managed Care – PPO

## 2022-10-08 DIAGNOSIS — Z8619 Personal history of other infectious and parasitic diseases: Secondary | ICD-10-CM | POA: Diagnosis not present

## 2022-10-08 DIAGNOSIS — J90 Pleural effusion, not elsewhere classified: Secondary | ICD-10-CM | POA: Diagnosis not present

## 2022-10-08 DIAGNOSIS — D61818 Other pancytopenia: Secondary | ICD-10-CM | POA: Diagnosis not present

## 2022-10-08 DIAGNOSIS — I700 Atherosclerosis of aorta: Secondary | ICD-10-CM | POA: Diagnosis not present

## 2022-10-08 DIAGNOSIS — C50111 Malignant neoplasm of central portion of right female breast: Secondary | ICD-10-CM | POA: Diagnosis not present

## 2022-10-08 DIAGNOSIS — Z438 Encounter for attention to other artificial openings: Secondary | ICD-10-CM | POA: Diagnosis not present

## 2022-10-08 DIAGNOSIS — R188 Other ascites: Secondary | ICD-10-CM | POA: Diagnosis not present

## 2022-10-08 DIAGNOSIS — R161 Splenomegaly, not elsewhere classified: Secondary | ICD-10-CM | POA: Diagnosis not present

## 2022-10-08 DIAGNOSIS — D696 Thrombocytopenia, unspecified: Secondary | ICD-10-CM | POA: Diagnosis not present

## 2022-10-08 DIAGNOSIS — E43 Unspecified severe protein-calorie malnutrition: Secondary | ICD-10-CM | POA: Diagnosis not present

## 2022-10-08 DIAGNOSIS — Z8639 Personal history of other endocrine, nutritional and metabolic disease: Secondary | ICD-10-CM | POA: Diagnosis not present

## 2022-10-08 DIAGNOSIS — Z452 Encounter for adjustment and management of vascular access device: Secondary | ICD-10-CM | POA: Diagnosis not present

## 2022-10-08 DIAGNOSIS — K766 Portal hypertension: Secondary | ICD-10-CM | POA: Diagnosis not present

## 2022-10-08 DIAGNOSIS — Z681 Body mass index (BMI) 19 or less, adult: Secondary | ICD-10-CM | POA: Diagnosis not present

## 2022-10-08 DIAGNOSIS — C782 Secondary malignant neoplasm of pleura: Secondary | ICD-10-CM | POA: Diagnosis not present

## 2022-10-08 DIAGNOSIS — C220 Liver cell carcinoma: Secondary | ICD-10-CM | POA: Diagnosis not present

## 2022-10-09 DIAGNOSIS — Z438 Encounter for attention to other artificial openings: Secondary | ICD-10-CM | POA: Diagnosis not present

## 2022-10-09 DIAGNOSIS — C220 Liver cell carcinoma: Secondary | ICD-10-CM | POA: Diagnosis not present

## 2022-10-09 DIAGNOSIS — Z8639 Personal history of other endocrine, nutritional and metabolic disease: Secondary | ICD-10-CM | POA: Diagnosis not present

## 2022-10-09 DIAGNOSIS — R188 Other ascites: Secondary | ICD-10-CM | POA: Diagnosis not present

## 2022-10-09 DIAGNOSIS — J90 Pleural effusion, not elsewhere classified: Secondary | ICD-10-CM | POA: Diagnosis not present

## 2022-10-09 DIAGNOSIS — E43 Unspecified severe protein-calorie malnutrition: Secondary | ICD-10-CM | POA: Diagnosis not present

## 2022-10-09 DIAGNOSIS — C782 Secondary malignant neoplasm of pleura: Secondary | ICD-10-CM | POA: Diagnosis not present

## 2022-10-09 DIAGNOSIS — Z681 Body mass index (BMI) 19 or less, adult: Secondary | ICD-10-CM | POA: Diagnosis not present

## 2022-10-09 DIAGNOSIS — C50111 Malignant neoplasm of central portion of right female breast: Secondary | ICD-10-CM | POA: Diagnosis not present

## 2022-10-09 DIAGNOSIS — K766 Portal hypertension: Secondary | ICD-10-CM | POA: Diagnosis not present

## 2022-10-09 DIAGNOSIS — Z452 Encounter for adjustment and management of vascular access device: Secondary | ICD-10-CM | POA: Diagnosis not present

## 2022-10-09 DIAGNOSIS — Z8619 Personal history of other infectious and parasitic diseases: Secondary | ICD-10-CM | POA: Diagnosis not present

## 2022-10-09 DIAGNOSIS — R161 Splenomegaly, not elsewhere classified: Secondary | ICD-10-CM | POA: Diagnosis not present

## 2022-10-09 DIAGNOSIS — D61818 Other pancytopenia: Secondary | ICD-10-CM | POA: Diagnosis not present

## 2022-10-09 DIAGNOSIS — I700 Atherosclerosis of aorta: Secondary | ICD-10-CM | POA: Diagnosis not present

## 2022-10-09 DIAGNOSIS — D696 Thrombocytopenia, unspecified: Secondary | ICD-10-CM | POA: Diagnosis not present

## 2022-10-10 DIAGNOSIS — Z681 Body mass index (BMI) 19 or less, adult: Secondary | ICD-10-CM | POA: Diagnosis not present

## 2022-10-10 DIAGNOSIS — C782 Secondary malignant neoplasm of pleura: Secondary | ICD-10-CM | POA: Diagnosis not present

## 2022-10-10 DIAGNOSIS — Z8619 Personal history of other infectious and parasitic diseases: Secondary | ICD-10-CM | POA: Diagnosis not present

## 2022-10-10 DIAGNOSIS — Z452 Encounter for adjustment and management of vascular access device: Secondary | ICD-10-CM | POA: Diagnosis not present

## 2022-10-10 DIAGNOSIS — J90 Pleural effusion, not elsewhere classified: Secondary | ICD-10-CM | POA: Diagnosis not present

## 2022-10-10 DIAGNOSIS — K766 Portal hypertension: Secondary | ICD-10-CM | POA: Diagnosis not present

## 2022-10-10 DIAGNOSIS — E43 Unspecified severe protein-calorie malnutrition: Secondary | ICD-10-CM | POA: Diagnosis not present

## 2022-10-10 DIAGNOSIS — C50111 Malignant neoplasm of central portion of right female breast: Secondary | ICD-10-CM | POA: Diagnosis not present

## 2022-10-10 DIAGNOSIS — R161 Splenomegaly, not elsewhere classified: Secondary | ICD-10-CM | POA: Diagnosis not present

## 2022-10-10 DIAGNOSIS — D696 Thrombocytopenia, unspecified: Secondary | ICD-10-CM | POA: Diagnosis not present

## 2022-10-10 DIAGNOSIS — D61818 Other pancytopenia: Secondary | ICD-10-CM | POA: Diagnosis not present

## 2022-10-10 DIAGNOSIS — I700 Atherosclerosis of aorta: Secondary | ICD-10-CM | POA: Diagnosis not present

## 2022-10-10 DIAGNOSIS — Z8639 Personal history of other endocrine, nutritional and metabolic disease: Secondary | ICD-10-CM | POA: Diagnosis not present

## 2022-10-10 DIAGNOSIS — C220 Liver cell carcinoma: Secondary | ICD-10-CM | POA: Diagnosis not present

## 2022-10-10 DIAGNOSIS — R188 Other ascites: Secondary | ICD-10-CM | POA: Diagnosis not present

## 2022-10-10 DIAGNOSIS — Z438 Encounter for attention to other artificial openings: Secondary | ICD-10-CM | POA: Diagnosis not present

## 2022-10-11 ENCOUNTER — Ambulatory Visit: Payer: BC Managed Care – PPO | Admitting: Nurse Practitioner

## 2022-10-11 DIAGNOSIS — K766 Portal hypertension: Secondary | ICD-10-CM | POA: Diagnosis not present

## 2022-10-11 DIAGNOSIS — I700 Atherosclerosis of aorta: Secondary | ICD-10-CM | POA: Diagnosis not present

## 2022-10-11 DIAGNOSIS — R188 Other ascites: Secondary | ICD-10-CM | POA: Diagnosis not present

## 2022-10-11 DIAGNOSIS — R180 Malignant ascites: Secondary | ICD-10-CM | POA: Diagnosis not present

## 2022-10-11 DIAGNOSIS — C782 Secondary malignant neoplasm of pleura: Secondary | ICD-10-CM | POA: Diagnosis not present

## 2022-10-11 DIAGNOSIS — J90 Pleural effusion, not elsewhere classified: Secondary | ICD-10-CM | POA: Diagnosis not present

## 2022-10-11 DIAGNOSIS — Z681 Body mass index (BMI) 19 or less, adult: Secondary | ICD-10-CM | POA: Diagnosis not present

## 2022-10-11 DIAGNOSIS — C50919 Malignant neoplasm of unspecified site of unspecified female breast: Secondary | ICD-10-CM | POA: Diagnosis not present

## 2022-10-11 DIAGNOSIS — Z438 Encounter for attention to other artificial openings: Secondary | ICD-10-CM | POA: Diagnosis not present

## 2022-10-11 DIAGNOSIS — C220 Liver cell carcinoma: Secondary | ICD-10-CM | POA: Diagnosis not present

## 2022-10-11 DIAGNOSIS — R161 Splenomegaly, not elsewhere classified: Secondary | ICD-10-CM | POA: Diagnosis not present

## 2022-10-11 DIAGNOSIS — D61818 Other pancytopenia: Secondary | ICD-10-CM | POA: Diagnosis not present

## 2022-10-11 DIAGNOSIS — D696 Thrombocytopenia, unspecified: Secondary | ICD-10-CM | POA: Diagnosis not present

## 2022-10-11 DIAGNOSIS — E43 Unspecified severe protein-calorie malnutrition: Secondary | ICD-10-CM | POA: Diagnosis not present

## 2022-10-11 DIAGNOSIS — Z8639 Personal history of other endocrine, nutritional and metabolic disease: Secondary | ICD-10-CM | POA: Diagnosis not present

## 2022-10-11 DIAGNOSIS — Z8619 Personal history of other infectious and parasitic diseases: Secondary | ICD-10-CM | POA: Diagnosis not present

## 2022-10-11 DIAGNOSIS — Z452 Encounter for adjustment and management of vascular access device: Secondary | ICD-10-CM | POA: Diagnosis not present

## 2022-10-11 DIAGNOSIS — C50111 Malignant neoplasm of central portion of right female breast: Secondary | ICD-10-CM | POA: Diagnosis not present

## 2022-10-12 DIAGNOSIS — Z452 Encounter for adjustment and management of vascular access device: Secondary | ICD-10-CM | POA: Diagnosis not present

## 2022-10-12 DIAGNOSIS — K766 Portal hypertension: Secondary | ICD-10-CM | POA: Diagnosis not present

## 2022-10-12 DIAGNOSIS — I700 Atherosclerosis of aorta: Secondary | ICD-10-CM | POA: Diagnosis not present

## 2022-10-12 DIAGNOSIS — C220 Liver cell carcinoma: Secondary | ICD-10-CM | POA: Diagnosis not present

## 2022-10-12 DIAGNOSIS — R161 Splenomegaly, not elsewhere classified: Secondary | ICD-10-CM | POA: Diagnosis not present

## 2022-10-12 DIAGNOSIS — Z8639 Personal history of other endocrine, nutritional and metabolic disease: Secondary | ICD-10-CM | POA: Diagnosis not present

## 2022-10-12 DIAGNOSIS — Z681 Body mass index (BMI) 19 or less, adult: Secondary | ICD-10-CM | POA: Diagnosis not present

## 2022-10-12 DIAGNOSIS — Z438 Encounter for attention to other artificial openings: Secondary | ICD-10-CM | POA: Diagnosis not present

## 2022-10-12 DIAGNOSIS — C50111 Malignant neoplasm of central portion of right female breast: Secondary | ICD-10-CM | POA: Diagnosis not present

## 2022-10-12 DIAGNOSIS — Z8619 Personal history of other infectious and parasitic diseases: Secondary | ICD-10-CM | POA: Diagnosis not present

## 2022-10-12 DIAGNOSIS — D61818 Other pancytopenia: Secondary | ICD-10-CM | POA: Diagnosis not present

## 2022-10-12 DIAGNOSIS — E43 Unspecified severe protein-calorie malnutrition: Secondary | ICD-10-CM | POA: Diagnosis not present

## 2022-10-12 DIAGNOSIS — D696 Thrombocytopenia, unspecified: Secondary | ICD-10-CM | POA: Diagnosis not present

## 2022-10-12 DIAGNOSIS — C782 Secondary malignant neoplasm of pleura: Secondary | ICD-10-CM | POA: Diagnosis not present

## 2022-10-12 DIAGNOSIS — R188 Other ascites: Secondary | ICD-10-CM | POA: Diagnosis not present

## 2022-10-12 DIAGNOSIS — J90 Pleural effusion, not elsewhere classified: Secondary | ICD-10-CM | POA: Diagnosis not present

## 2022-10-13 DIAGNOSIS — D61818 Other pancytopenia: Secondary | ICD-10-CM | POA: Diagnosis not present

## 2022-10-13 DIAGNOSIS — R188 Other ascites: Secondary | ICD-10-CM | POA: Diagnosis not present

## 2022-10-13 DIAGNOSIS — C220 Liver cell carcinoma: Secondary | ICD-10-CM | POA: Diagnosis not present

## 2022-10-13 DIAGNOSIS — Z681 Body mass index (BMI) 19 or less, adult: Secondary | ICD-10-CM | POA: Diagnosis not present

## 2022-10-13 DIAGNOSIS — K766 Portal hypertension: Secondary | ICD-10-CM | POA: Diagnosis not present

## 2022-10-13 DIAGNOSIS — C50111 Malignant neoplasm of central portion of right female breast: Secondary | ICD-10-CM | POA: Diagnosis not present

## 2022-10-13 DIAGNOSIS — E43 Unspecified severe protein-calorie malnutrition: Secondary | ICD-10-CM | POA: Diagnosis not present

## 2022-10-13 DIAGNOSIS — J90 Pleural effusion, not elsewhere classified: Secondary | ICD-10-CM | POA: Diagnosis not present

## 2022-10-13 DIAGNOSIS — C782 Secondary malignant neoplasm of pleura: Secondary | ICD-10-CM | POA: Diagnosis not present

## 2022-10-13 DIAGNOSIS — R161 Splenomegaly, not elsewhere classified: Secondary | ICD-10-CM | POA: Diagnosis not present

## 2022-10-13 DIAGNOSIS — Z438 Encounter for attention to other artificial openings: Secondary | ICD-10-CM | POA: Diagnosis not present

## 2022-10-13 DIAGNOSIS — Z8619 Personal history of other infectious and parasitic diseases: Secondary | ICD-10-CM | POA: Diagnosis not present

## 2022-10-13 DIAGNOSIS — I700 Atherosclerosis of aorta: Secondary | ICD-10-CM | POA: Diagnosis not present

## 2022-10-13 DIAGNOSIS — D696 Thrombocytopenia, unspecified: Secondary | ICD-10-CM | POA: Diagnosis not present

## 2022-10-13 DIAGNOSIS — Z8639 Personal history of other endocrine, nutritional and metabolic disease: Secondary | ICD-10-CM | POA: Diagnosis not present

## 2022-10-13 DIAGNOSIS — Z452 Encounter for adjustment and management of vascular access device: Secondary | ICD-10-CM | POA: Diagnosis not present

## 2022-10-14 DIAGNOSIS — Z8639 Personal history of other endocrine, nutritional and metabolic disease: Secondary | ICD-10-CM | POA: Diagnosis not present

## 2022-10-14 DIAGNOSIS — Z8619 Personal history of other infectious and parasitic diseases: Secondary | ICD-10-CM | POA: Diagnosis not present

## 2022-10-14 DIAGNOSIS — Z681 Body mass index (BMI) 19 or less, adult: Secondary | ICD-10-CM | POA: Diagnosis not present

## 2022-10-14 DIAGNOSIS — C50111 Malignant neoplasm of central portion of right female breast: Secondary | ICD-10-CM | POA: Diagnosis not present

## 2022-10-14 DIAGNOSIS — D61818 Other pancytopenia: Secondary | ICD-10-CM | POA: Diagnosis not present

## 2022-10-14 DIAGNOSIS — R188 Other ascites: Secondary | ICD-10-CM | POA: Diagnosis not present

## 2022-10-14 DIAGNOSIS — C782 Secondary malignant neoplasm of pleura: Secondary | ICD-10-CM | POA: Diagnosis not present

## 2022-10-14 DIAGNOSIS — D696 Thrombocytopenia, unspecified: Secondary | ICD-10-CM | POA: Diagnosis not present

## 2022-10-14 DIAGNOSIS — Z452 Encounter for adjustment and management of vascular access device: Secondary | ICD-10-CM | POA: Diagnosis not present

## 2022-10-14 DIAGNOSIS — Z438 Encounter for attention to other artificial openings: Secondary | ICD-10-CM | POA: Diagnosis not present

## 2022-10-14 DIAGNOSIS — R161 Splenomegaly, not elsewhere classified: Secondary | ICD-10-CM | POA: Diagnosis not present

## 2022-10-14 DIAGNOSIS — I700 Atherosclerosis of aorta: Secondary | ICD-10-CM | POA: Diagnosis not present

## 2022-10-14 DIAGNOSIS — K766 Portal hypertension: Secondary | ICD-10-CM | POA: Diagnosis not present

## 2022-10-14 DIAGNOSIS — E43 Unspecified severe protein-calorie malnutrition: Secondary | ICD-10-CM | POA: Diagnosis not present

## 2022-10-14 DIAGNOSIS — C220 Liver cell carcinoma: Secondary | ICD-10-CM | POA: Diagnosis not present

## 2022-10-14 DIAGNOSIS — J90 Pleural effusion, not elsewhere classified: Secondary | ICD-10-CM | POA: Diagnosis not present

## 2022-10-15 DIAGNOSIS — R188 Other ascites: Secondary | ICD-10-CM | POA: Diagnosis not present

## 2022-10-15 DIAGNOSIS — K766 Portal hypertension: Secondary | ICD-10-CM | POA: Diagnosis not present

## 2022-10-15 DIAGNOSIS — E43 Unspecified severe protein-calorie malnutrition: Secondary | ICD-10-CM | POA: Diagnosis not present

## 2022-10-15 DIAGNOSIS — J90 Pleural effusion, not elsewhere classified: Secondary | ICD-10-CM | POA: Diagnosis not present

## 2022-10-15 DIAGNOSIS — Z8619 Personal history of other infectious and parasitic diseases: Secondary | ICD-10-CM | POA: Diagnosis not present

## 2022-10-15 DIAGNOSIS — D696 Thrombocytopenia, unspecified: Secondary | ICD-10-CM | POA: Diagnosis not present

## 2022-10-15 DIAGNOSIS — D61818 Other pancytopenia: Secondary | ICD-10-CM | POA: Diagnosis not present

## 2022-10-15 DIAGNOSIS — C50111 Malignant neoplasm of central portion of right female breast: Secondary | ICD-10-CM | POA: Diagnosis not present

## 2022-10-15 DIAGNOSIS — C782 Secondary malignant neoplasm of pleura: Secondary | ICD-10-CM | POA: Diagnosis not present

## 2022-10-15 DIAGNOSIS — Z438 Encounter for attention to other artificial openings: Secondary | ICD-10-CM | POA: Diagnosis not present

## 2022-10-15 DIAGNOSIS — Z681 Body mass index (BMI) 19 or less, adult: Secondary | ICD-10-CM | POA: Diagnosis not present

## 2022-10-15 DIAGNOSIS — I700 Atherosclerosis of aorta: Secondary | ICD-10-CM | POA: Diagnosis not present

## 2022-10-15 DIAGNOSIS — Z452 Encounter for adjustment and management of vascular access device: Secondary | ICD-10-CM | POA: Diagnosis not present

## 2022-10-15 DIAGNOSIS — R161 Splenomegaly, not elsewhere classified: Secondary | ICD-10-CM | POA: Diagnosis not present

## 2022-10-15 DIAGNOSIS — C220 Liver cell carcinoma: Secondary | ICD-10-CM | POA: Diagnosis not present

## 2022-10-15 DIAGNOSIS — Z8639 Personal history of other endocrine, nutritional and metabolic disease: Secondary | ICD-10-CM | POA: Diagnosis not present

## 2022-10-15 NOTE — Addendum Note (Signed)
Encounter addended by: Janine Limbo, RT on: 10/15/2022 10:48 AM  Actions taken: Imaging Exam ended

## 2022-10-16 DIAGNOSIS — I700 Atherosclerosis of aorta: Secondary | ICD-10-CM | POA: Diagnosis not present

## 2022-10-16 DIAGNOSIS — C220 Liver cell carcinoma: Secondary | ICD-10-CM | POA: Diagnosis not present

## 2022-10-16 DIAGNOSIS — Z438 Encounter for attention to other artificial openings: Secondary | ICD-10-CM | POA: Diagnosis not present

## 2022-10-16 DIAGNOSIS — J90 Pleural effusion, not elsewhere classified: Secondary | ICD-10-CM | POA: Diagnosis not present

## 2022-10-16 DIAGNOSIS — Z8619 Personal history of other infectious and parasitic diseases: Secondary | ICD-10-CM | POA: Diagnosis not present

## 2022-10-16 DIAGNOSIS — C50111 Malignant neoplasm of central portion of right female breast: Secondary | ICD-10-CM | POA: Diagnosis not present

## 2022-10-16 DIAGNOSIS — C782 Secondary malignant neoplasm of pleura: Secondary | ICD-10-CM | POA: Diagnosis not present

## 2022-10-16 DIAGNOSIS — R161 Splenomegaly, not elsewhere classified: Secondary | ICD-10-CM | POA: Diagnosis not present

## 2022-10-16 DIAGNOSIS — Z452 Encounter for adjustment and management of vascular access device: Secondary | ICD-10-CM | POA: Diagnosis not present

## 2022-10-16 DIAGNOSIS — E43 Unspecified severe protein-calorie malnutrition: Secondary | ICD-10-CM | POA: Diagnosis not present

## 2022-10-16 DIAGNOSIS — Z8639 Personal history of other endocrine, nutritional and metabolic disease: Secondary | ICD-10-CM | POA: Diagnosis not present

## 2022-10-16 DIAGNOSIS — K766 Portal hypertension: Secondary | ICD-10-CM | POA: Diagnosis not present

## 2022-10-16 DIAGNOSIS — D696 Thrombocytopenia, unspecified: Secondary | ICD-10-CM | POA: Diagnosis not present

## 2022-10-16 DIAGNOSIS — Z681 Body mass index (BMI) 19 or less, adult: Secondary | ICD-10-CM | POA: Diagnosis not present

## 2022-10-16 DIAGNOSIS — R188 Other ascites: Secondary | ICD-10-CM | POA: Diagnosis not present

## 2022-10-16 DIAGNOSIS — D61818 Other pancytopenia: Secondary | ICD-10-CM | POA: Diagnosis not present

## 2022-10-17 DIAGNOSIS — D696 Thrombocytopenia, unspecified: Secondary | ICD-10-CM | POA: Diagnosis not present

## 2022-10-17 DIAGNOSIS — R188 Other ascites: Secondary | ICD-10-CM | POA: Diagnosis not present

## 2022-10-17 DIAGNOSIS — C782 Secondary malignant neoplasm of pleura: Secondary | ICD-10-CM | POA: Diagnosis not present

## 2022-10-17 DIAGNOSIS — Z8619 Personal history of other infectious and parasitic diseases: Secondary | ICD-10-CM | POA: Diagnosis not present

## 2022-10-17 DIAGNOSIS — Z438 Encounter for attention to other artificial openings: Secondary | ICD-10-CM | POA: Diagnosis not present

## 2022-10-17 DIAGNOSIS — R161 Splenomegaly, not elsewhere classified: Secondary | ICD-10-CM | POA: Diagnosis not present

## 2022-10-17 DIAGNOSIS — E43 Unspecified severe protein-calorie malnutrition: Secondary | ICD-10-CM | POA: Diagnosis not present

## 2022-10-17 DIAGNOSIS — I700 Atherosclerosis of aorta: Secondary | ICD-10-CM | POA: Diagnosis not present

## 2022-10-17 DIAGNOSIS — Z8639 Personal history of other endocrine, nutritional and metabolic disease: Secondary | ICD-10-CM | POA: Diagnosis not present

## 2022-10-17 DIAGNOSIS — C50111 Malignant neoplasm of central portion of right female breast: Secondary | ICD-10-CM | POA: Diagnosis not present

## 2022-10-17 DIAGNOSIS — Z681 Body mass index (BMI) 19 or less, adult: Secondary | ICD-10-CM | POA: Diagnosis not present

## 2022-10-17 DIAGNOSIS — D61818 Other pancytopenia: Secondary | ICD-10-CM | POA: Diagnosis not present

## 2022-10-17 DIAGNOSIS — Z452 Encounter for adjustment and management of vascular access device: Secondary | ICD-10-CM | POA: Diagnosis not present

## 2022-10-17 DIAGNOSIS — K766 Portal hypertension: Secondary | ICD-10-CM | POA: Diagnosis not present

## 2022-10-17 DIAGNOSIS — C220 Liver cell carcinoma: Secondary | ICD-10-CM | POA: Diagnosis not present

## 2022-10-17 DIAGNOSIS — J90 Pleural effusion, not elsewhere classified: Secondary | ICD-10-CM | POA: Diagnosis not present

## 2022-10-18 DIAGNOSIS — Z452 Encounter for adjustment and management of vascular access device: Secondary | ICD-10-CM | POA: Diagnosis not present

## 2022-10-18 DIAGNOSIS — C782 Secondary malignant neoplasm of pleura: Secondary | ICD-10-CM | POA: Diagnosis not present

## 2022-10-18 DIAGNOSIS — Z438 Encounter for attention to other artificial openings: Secondary | ICD-10-CM | POA: Diagnosis not present

## 2022-10-18 DIAGNOSIS — Z8619 Personal history of other infectious and parasitic diseases: Secondary | ICD-10-CM | POA: Diagnosis not present

## 2022-10-18 DIAGNOSIS — J90 Pleural effusion, not elsewhere classified: Secondary | ICD-10-CM | POA: Diagnosis not present

## 2022-10-18 DIAGNOSIS — C50111 Malignant neoplasm of central portion of right female breast: Secondary | ICD-10-CM | POA: Diagnosis not present

## 2022-10-18 DIAGNOSIS — D696 Thrombocytopenia, unspecified: Secondary | ICD-10-CM | POA: Diagnosis not present

## 2022-10-18 DIAGNOSIS — K766 Portal hypertension: Secondary | ICD-10-CM | POA: Diagnosis not present

## 2022-10-18 DIAGNOSIS — I700 Atherosclerosis of aorta: Secondary | ICD-10-CM | POA: Diagnosis not present

## 2022-10-18 DIAGNOSIS — D61818 Other pancytopenia: Secondary | ICD-10-CM | POA: Diagnosis not present

## 2022-10-18 DIAGNOSIS — Z681 Body mass index (BMI) 19 or less, adult: Secondary | ICD-10-CM | POA: Diagnosis not present

## 2022-10-18 DIAGNOSIS — R161 Splenomegaly, not elsewhere classified: Secondary | ICD-10-CM | POA: Diagnosis not present

## 2022-10-18 DIAGNOSIS — R188 Other ascites: Secondary | ICD-10-CM | POA: Diagnosis not present

## 2022-10-18 DIAGNOSIS — E43 Unspecified severe protein-calorie malnutrition: Secondary | ICD-10-CM | POA: Diagnosis not present

## 2022-10-18 DIAGNOSIS — C220 Liver cell carcinoma: Secondary | ICD-10-CM | POA: Diagnosis not present

## 2022-10-18 DIAGNOSIS — Z8639 Personal history of other endocrine, nutritional and metabolic disease: Secondary | ICD-10-CM | POA: Diagnosis not present

## 2022-10-19 ENCOUNTER — Other Ambulatory Visit (HOSPITAL_COMMUNITY): Payer: Self-pay | Admitting: Hospice and Palliative Medicine

## 2022-10-19 DIAGNOSIS — Z8619 Personal history of other infectious and parasitic diseases: Secondary | ICD-10-CM | POA: Diagnosis not present

## 2022-10-19 DIAGNOSIS — R161 Splenomegaly, not elsewhere classified: Secondary | ICD-10-CM | POA: Diagnosis not present

## 2022-10-19 DIAGNOSIS — J90 Pleural effusion, not elsewhere classified: Secondary | ICD-10-CM

## 2022-10-19 DIAGNOSIS — C220 Liver cell carcinoma: Secondary | ICD-10-CM | POA: Diagnosis not present

## 2022-10-19 DIAGNOSIS — K766 Portal hypertension: Secondary | ICD-10-CM | POA: Diagnosis not present

## 2022-10-19 DIAGNOSIS — I700 Atherosclerosis of aorta: Secondary | ICD-10-CM | POA: Diagnosis not present

## 2022-10-19 DIAGNOSIS — Z438 Encounter for attention to other artificial openings: Secondary | ICD-10-CM | POA: Diagnosis not present

## 2022-10-19 DIAGNOSIS — D696 Thrombocytopenia, unspecified: Secondary | ICD-10-CM | POA: Diagnosis not present

## 2022-10-19 DIAGNOSIS — C782 Secondary malignant neoplasm of pleura: Secondary | ICD-10-CM | POA: Diagnosis not present

## 2022-10-19 DIAGNOSIS — Z8639 Personal history of other endocrine, nutritional and metabolic disease: Secondary | ICD-10-CM | POA: Diagnosis not present

## 2022-10-19 DIAGNOSIS — Z452 Encounter for adjustment and management of vascular access device: Secondary | ICD-10-CM | POA: Diagnosis not present

## 2022-10-19 DIAGNOSIS — D61818 Other pancytopenia: Secondary | ICD-10-CM | POA: Diagnosis not present

## 2022-10-19 DIAGNOSIS — R188 Other ascites: Secondary | ICD-10-CM | POA: Diagnosis not present

## 2022-10-19 DIAGNOSIS — Z681 Body mass index (BMI) 19 or less, adult: Secondary | ICD-10-CM | POA: Diagnosis not present

## 2022-10-19 DIAGNOSIS — C50111 Malignant neoplasm of central portion of right female breast: Secondary | ICD-10-CM | POA: Diagnosis not present

## 2022-10-19 DIAGNOSIS — E43 Unspecified severe protein-calorie malnutrition: Secondary | ICD-10-CM | POA: Diagnosis not present

## 2022-10-20 ENCOUNTER — Ambulatory Visit (HOSPITAL_COMMUNITY)
Admission: RE | Admit: 2022-10-20 | Discharge: 2022-10-20 | Disposition: A | Discharge status: Home / Self Care | Payer: BC Managed Care – PPO | Source: Ambulatory Visit | Attending: Internal Medicine | Admitting: Internal Medicine

## 2022-10-20 ENCOUNTER — Ambulatory Visit (HOSPITAL_COMMUNITY)
Admission: RE | Admit: 2022-10-20 | Discharge: 2022-10-20 | Disposition: A | Discharge status: Home / Self Care | Payer: BC Managed Care – PPO | Source: Ambulatory Visit | Attending: Hospice and Palliative Medicine | Admitting: Hospice and Palliative Medicine

## 2022-10-20 VITALS — BP 101/62

## 2022-10-20 DIAGNOSIS — J439 Emphysema, unspecified: Secondary | ICD-10-CM | POA: Diagnosis not present

## 2022-10-20 DIAGNOSIS — Z853 Personal history of malignant neoplasm of breast: Secondary | ICD-10-CM | POA: Insufficient documentation

## 2022-10-20 DIAGNOSIS — E43 Unspecified severe protein-calorie malnutrition: Secondary | ICD-10-CM | POA: Diagnosis not present

## 2022-10-20 DIAGNOSIS — R161 Splenomegaly, not elsewhere classified: Secondary | ICD-10-CM | POA: Diagnosis not present

## 2022-10-20 DIAGNOSIS — Z8505 Personal history of malignant neoplasm of liver: Secondary | ICD-10-CM | POA: Diagnosis not present

## 2022-10-20 DIAGNOSIS — J90 Pleural effusion, not elsewhere classified: Secondary | ICD-10-CM | POA: Diagnosis not present

## 2022-10-20 DIAGNOSIS — R188 Other ascites: Secondary | ICD-10-CM | POA: Diagnosis not present

## 2022-10-20 DIAGNOSIS — D696 Thrombocytopenia, unspecified: Secondary | ICD-10-CM | POA: Diagnosis not present

## 2022-10-20 DIAGNOSIS — C50111 Malignant neoplasm of central portion of right female breast: Secondary | ICD-10-CM | POA: Diagnosis not present

## 2022-10-20 DIAGNOSIS — Z8639 Personal history of other endocrine, nutritional and metabolic disease: Secondary | ICD-10-CM | POA: Diagnosis not present

## 2022-10-20 DIAGNOSIS — C220 Liver cell carcinoma: Secondary | ICD-10-CM | POA: Diagnosis not present

## 2022-10-20 DIAGNOSIS — R846 Abnormal cytological findings in specimens from respiratory organs and thorax: Secondary | ICD-10-CM | POA: Diagnosis not present

## 2022-10-20 DIAGNOSIS — Z438 Encounter for attention to other artificial openings: Secondary | ICD-10-CM | POA: Diagnosis not present

## 2022-10-20 DIAGNOSIS — Z681 Body mass index (BMI) 19 or less, adult: Secondary | ICD-10-CM | POA: Diagnosis not present

## 2022-10-20 DIAGNOSIS — C782 Secondary malignant neoplasm of pleura: Secondary | ICD-10-CM | POA: Diagnosis not present

## 2022-10-20 DIAGNOSIS — I700 Atherosclerosis of aorta: Secondary | ICD-10-CM | POA: Diagnosis not present

## 2022-10-20 DIAGNOSIS — Z8619 Personal history of other infectious and parasitic diseases: Secondary | ICD-10-CM | POA: Diagnosis not present

## 2022-10-20 DIAGNOSIS — Z9889 Other specified postprocedural states: Secondary | ICD-10-CM

## 2022-10-20 DIAGNOSIS — D61818 Other pancytopenia: Secondary | ICD-10-CM | POA: Diagnosis not present

## 2022-10-20 DIAGNOSIS — J9811 Atelectasis: Secondary | ICD-10-CM | POA: Diagnosis not present

## 2022-10-20 DIAGNOSIS — Z452 Encounter for adjustment and management of vascular access device: Secondary | ICD-10-CM | POA: Diagnosis not present

## 2022-10-20 DIAGNOSIS — K766 Portal hypertension: Secondary | ICD-10-CM | POA: Diagnosis not present

## 2022-10-20 MED ORDER — LIDOCAINE HCL 1 % IJ SOLN
INTRAMUSCULAR | Status: AC
  Administered 2022-10-20: 15 mL
  Filled 2022-10-20: qty 20

## 2022-10-20 NOTE — Procedures (Signed)
PROCEDURE SUMMARY:  Successful US guided diagnostic and therapeutic right thoracentesis. Yielded 1.5 L of clear, yellow fluid. Pt tolerated procedure well. No immediate complications.  Specimen was sent for labs. CXR ordered.  EBL < 1 mL  Tyson Alias, AGNP 10/20/2022 3:20 PM

## 2022-10-21 DIAGNOSIS — R161 Splenomegaly, not elsewhere classified: Secondary | ICD-10-CM | POA: Diagnosis not present

## 2022-10-21 DIAGNOSIS — Z452 Encounter for adjustment and management of vascular access device: Secondary | ICD-10-CM | POA: Diagnosis not present

## 2022-10-21 DIAGNOSIS — C220 Liver cell carcinoma: Secondary | ICD-10-CM | POA: Diagnosis not present

## 2022-10-21 DIAGNOSIS — K766 Portal hypertension: Secondary | ICD-10-CM | POA: Diagnosis not present

## 2022-10-21 DIAGNOSIS — Z8639 Personal history of other endocrine, nutritional and metabolic disease: Secondary | ICD-10-CM | POA: Diagnosis not present

## 2022-10-21 DIAGNOSIS — C50111 Malignant neoplasm of central portion of right female breast: Secondary | ICD-10-CM | POA: Diagnosis not present

## 2022-10-21 DIAGNOSIS — E43 Unspecified severe protein-calorie malnutrition: Secondary | ICD-10-CM | POA: Diagnosis not present

## 2022-10-21 DIAGNOSIS — R188 Other ascites: Secondary | ICD-10-CM | POA: Diagnosis not present

## 2022-10-21 DIAGNOSIS — Z8619 Personal history of other infectious and parasitic diseases: Secondary | ICD-10-CM | POA: Diagnosis not present

## 2022-10-21 DIAGNOSIS — C782 Secondary malignant neoplasm of pleura: Secondary | ICD-10-CM | POA: Diagnosis not present

## 2022-10-21 DIAGNOSIS — D696 Thrombocytopenia, unspecified: Secondary | ICD-10-CM | POA: Diagnosis not present

## 2022-10-21 DIAGNOSIS — I700 Atherosclerosis of aorta: Secondary | ICD-10-CM | POA: Diagnosis not present

## 2022-10-21 DIAGNOSIS — J90 Pleural effusion, not elsewhere classified: Secondary | ICD-10-CM | POA: Diagnosis not present

## 2022-10-21 DIAGNOSIS — Z438 Encounter for attention to other artificial openings: Secondary | ICD-10-CM | POA: Diagnosis not present

## 2022-10-21 DIAGNOSIS — D61818 Other pancytopenia: Secondary | ICD-10-CM | POA: Diagnosis not present

## 2022-10-21 DIAGNOSIS — Z681 Body mass index (BMI) 19 or less, adult: Secondary | ICD-10-CM | POA: Diagnosis not present

## 2022-10-22 DIAGNOSIS — Z438 Encounter for attention to other artificial openings: Secondary | ICD-10-CM | POA: Diagnosis not present

## 2022-10-22 DIAGNOSIS — C782 Secondary malignant neoplasm of pleura: Secondary | ICD-10-CM | POA: Diagnosis not present

## 2022-10-22 DIAGNOSIS — J90 Pleural effusion, not elsewhere classified: Secondary | ICD-10-CM | POA: Diagnosis not present

## 2022-10-22 DIAGNOSIS — D61818 Other pancytopenia: Secondary | ICD-10-CM | POA: Diagnosis not present

## 2022-10-22 DIAGNOSIS — I700 Atherosclerosis of aorta: Secondary | ICD-10-CM | POA: Diagnosis not present

## 2022-10-22 DIAGNOSIS — R188 Other ascites: Secondary | ICD-10-CM | POA: Diagnosis not present

## 2022-10-22 DIAGNOSIS — E43 Unspecified severe protein-calorie malnutrition: Secondary | ICD-10-CM | POA: Diagnosis not present

## 2022-10-22 DIAGNOSIS — D696 Thrombocytopenia, unspecified: Secondary | ICD-10-CM | POA: Diagnosis not present

## 2022-10-22 DIAGNOSIS — Z452 Encounter for adjustment and management of vascular access device: Secondary | ICD-10-CM | POA: Diagnosis not present

## 2022-10-22 DIAGNOSIS — R161 Splenomegaly, not elsewhere classified: Secondary | ICD-10-CM | POA: Diagnosis not present

## 2022-10-22 DIAGNOSIS — Z8619 Personal history of other infectious and parasitic diseases: Secondary | ICD-10-CM | POA: Diagnosis not present

## 2022-10-22 DIAGNOSIS — Z681 Body mass index (BMI) 19 or less, adult: Secondary | ICD-10-CM | POA: Diagnosis not present

## 2022-10-22 DIAGNOSIS — Z8639 Personal history of other endocrine, nutritional and metabolic disease: Secondary | ICD-10-CM | POA: Diagnosis not present

## 2022-10-22 DIAGNOSIS — C50111 Malignant neoplasm of central portion of right female breast: Secondary | ICD-10-CM | POA: Diagnosis not present

## 2022-10-22 DIAGNOSIS — C220 Liver cell carcinoma: Secondary | ICD-10-CM | POA: Diagnosis not present

## 2022-10-22 DIAGNOSIS — K766 Portal hypertension: Secondary | ICD-10-CM | POA: Diagnosis not present

## 2022-10-23 DIAGNOSIS — D61818 Other pancytopenia: Secondary | ICD-10-CM | POA: Diagnosis not present

## 2022-10-23 DIAGNOSIS — J90 Pleural effusion, not elsewhere classified: Secondary | ICD-10-CM | POA: Diagnosis not present

## 2022-10-23 DIAGNOSIS — C50111 Malignant neoplasm of central portion of right female breast: Secondary | ICD-10-CM | POA: Diagnosis not present

## 2022-10-23 DIAGNOSIS — C782 Secondary malignant neoplasm of pleura: Secondary | ICD-10-CM | POA: Diagnosis not present

## 2022-10-23 DIAGNOSIS — Z8619 Personal history of other infectious and parasitic diseases: Secondary | ICD-10-CM | POA: Diagnosis not present

## 2022-10-23 DIAGNOSIS — Z8639 Personal history of other endocrine, nutritional and metabolic disease: Secondary | ICD-10-CM | POA: Diagnosis not present

## 2022-10-23 DIAGNOSIS — Z681 Body mass index (BMI) 19 or less, adult: Secondary | ICD-10-CM | POA: Diagnosis not present

## 2022-10-23 DIAGNOSIS — K766 Portal hypertension: Secondary | ICD-10-CM | POA: Diagnosis not present

## 2022-10-23 DIAGNOSIS — I700 Atherosclerosis of aorta: Secondary | ICD-10-CM | POA: Diagnosis not present

## 2022-10-23 DIAGNOSIS — E43 Unspecified severe protein-calorie malnutrition: Secondary | ICD-10-CM | POA: Diagnosis not present

## 2022-10-23 DIAGNOSIS — Z452 Encounter for adjustment and management of vascular access device: Secondary | ICD-10-CM | POA: Diagnosis not present

## 2022-10-23 DIAGNOSIS — R161 Splenomegaly, not elsewhere classified: Secondary | ICD-10-CM | POA: Diagnosis not present

## 2022-10-23 DIAGNOSIS — D696 Thrombocytopenia, unspecified: Secondary | ICD-10-CM | POA: Diagnosis not present

## 2022-10-23 DIAGNOSIS — Z438 Encounter for attention to other artificial openings: Secondary | ICD-10-CM | POA: Diagnosis not present

## 2022-10-23 DIAGNOSIS — C220 Liver cell carcinoma: Secondary | ICD-10-CM | POA: Diagnosis not present

## 2022-10-23 DIAGNOSIS — R188 Other ascites: Secondary | ICD-10-CM | POA: Diagnosis not present

## 2022-10-24 DIAGNOSIS — I700 Atherosclerosis of aorta: Secondary | ICD-10-CM | POA: Diagnosis not present

## 2022-10-24 DIAGNOSIS — C50111 Malignant neoplasm of central portion of right female breast: Secondary | ICD-10-CM | POA: Diagnosis not present

## 2022-10-24 DIAGNOSIS — D696 Thrombocytopenia, unspecified: Secondary | ICD-10-CM | POA: Diagnosis not present

## 2022-10-24 DIAGNOSIS — Z452 Encounter for adjustment and management of vascular access device: Secondary | ICD-10-CM | POA: Diagnosis not present

## 2022-10-24 DIAGNOSIS — Z438 Encounter for attention to other artificial openings: Secondary | ICD-10-CM | POA: Diagnosis not present

## 2022-10-24 DIAGNOSIS — E43 Unspecified severe protein-calorie malnutrition: Secondary | ICD-10-CM | POA: Diagnosis not present

## 2022-10-24 DIAGNOSIS — J90 Pleural effusion, not elsewhere classified: Secondary | ICD-10-CM | POA: Diagnosis not present

## 2022-10-24 DIAGNOSIS — Z8639 Personal history of other endocrine, nutritional and metabolic disease: Secondary | ICD-10-CM | POA: Diagnosis not present

## 2022-10-24 DIAGNOSIS — C782 Secondary malignant neoplasm of pleura: Secondary | ICD-10-CM | POA: Diagnosis not present

## 2022-10-24 DIAGNOSIS — C220 Liver cell carcinoma: Secondary | ICD-10-CM | POA: Diagnosis not present

## 2022-10-24 DIAGNOSIS — R188 Other ascites: Secondary | ICD-10-CM | POA: Diagnosis not present

## 2022-10-24 DIAGNOSIS — K766 Portal hypertension: Secondary | ICD-10-CM | POA: Diagnosis not present

## 2022-10-24 DIAGNOSIS — Z681 Body mass index (BMI) 19 or less, adult: Secondary | ICD-10-CM | POA: Diagnosis not present

## 2022-10-24 DIAGNOSIS — D61818 Other pancytopenia: Secondary | ICD-10-CM | POA: Diagnosis not present

## 2022-10-24 DIAGNOSIS — R161 Splenomegaly, not elsewhere classified: Secondary | ICD-10-CM | POA: Diagnosis not present

## 2022-10-24 DIAGNOSIS — Z8619 Personal history of other infectious and parasitic diseases: Secondary | ICD-10-CM | POA: Diagnosis not present

## 2022-10-25 DIAGNOSIS — R161 Splenomegaly, not elsewhere classified: Secondary | ICD-10-CM | POA: Diagnosis not present

## 2022-10-25 DIAGNOSIS — C50919 Malignant neoplasm of unspecified site of unspecified female breast: Secondary | ICD-10-CM | POA: Diagnosis not present

## 2022-10-25 DIAGNOSIS — Z8639 Personal history of other endocrine, nutritional and metabolic disease: Secondary | ICD-10-CM | POA: Diagnosis not present

## 2022-10-25 DIAGNOSIS — C220 Liver cell carcinoma: Secondary | ICD-10-CM | POA: Diagnosis not present

## 2022-10-25 DIAGNOSIS — C50111 Malignant neoplasm of central portion of right female breast: Secondary | ICD-10-CM | POA: Diagnosis not present

## 2022-10-25 DIAGNOSIS — D696 Thrombocytopenia, unspecified: Secondary | ICD-10-CM | POA: Diagnosis not present

## 2022-10-25 DIAGNOSIS — Z681 Body mass index (BMI) 19 or less, adult: Secondary | ICD-10-CM | POA: Diagnosis not present

## 2022-10-25 DIAGNOSIS — D61818 Other pancytopenia: Secondary | ICD-10-CM | POA: Diagnosis not present

## 2022-10-25 DIAGNOSIS — E43 Unspecified severe protein-calorie malnutrition: Secondary | ICD-10-CM | POA: Diagnosis not present

## 2022-10-25 DIAGNOSIS — Z8619 Personal history of other infectious and parasitic diseases: Secondary | ICD-10-CM | POA: Diagnosis not present

## 2022-10-25 DIAGNOSIS — Z438 Encounter for attention to other artificial openings: Secondary | ICD-10-CM | POA: Diagnosis not present

## 2022-10-25 DIAGNOSIS — J90 Pleural effusion, not elsewhere classified: Secondary | ICD-10-CM | POA: Diagnosis not present

## 2022-10-25 DIAGNOSIS — K766 Portal hypertension: Secondary | ICD-10-CM | POA: Diagnosis not present

## 2022-10-25 DIAGNOSIS — Z452 Encounter for adjustment and management of vascular access device: Secondary | ICD-10-CM | POA: Diagnosis not present

## 2022-10-25 DIAGNOSIS — I700 Atherosclerosis of aorta: Secondary | ICD-10-CM | POA: Diagnosis not present

## 2022-10-25 DIAGNOSIS — R188 Other ascites: Secondary | ICD-10-CM | POA: Diagnosis not present

## 2022-10-25 DIAGNOSIS — C782 Secondary malignant neoplasm of pleura: Secondary | ICD-10-CM | POA: Diagnosis not present

## 2022-10-25 DIAGNOSIS — R180 Malignant ascites: Secondary | ICD-10-CM | POA: Diagnosis not present

## 2022-10-26 DIAGNOSIS — C220 Liver cell carcinoma: Secondary | ICD-10-CM | POA: Diagnosis not present

## 2022-10-26 DIAGNOSIS — Z681 Body mass index (BMI) 19 or less, adult: Secondary | ICD-10-CM | POA: Diagnosis not present

## 2022-10-26 DIAGNOSIS — R188 Other ascites: Secondary | ICD-10-CM | POA: Diagnosis not present

## 2022-10-26 DIAGNOSIS — J90 Pleural effusion, not elsewhere classified: Secondary | ICD-10-CM | POA: Diagnosis not present

## 2022-10-26 DIAGNOSIS — D696 Thrombocytopenia, unspecified: Secondary | ICD-10-CM | POA: Diagnosis not present

## 2022-10-26 DIAGNOSIS — Z8619 Personal history of other infectious and parasitic diseases: Secondary | ICD-10-CM | POA: Diagnosis not present

## 2022-10-26 DIAGNOSIS — R161 Splenomegaly, not elsewhere classified: Secondary | ICD-10-CM | POA: Diagnosis not present

## 2022-10-26 DIAGNOSIS — D61818 Other pancytopenia: Secondary | ICD-10-CM | POA: Diagnosis not present

## 2022-10-26 DIAGNOSIS — Z8639 Personal history of other endocrine, nutritional and metabolic disease: Secondary | ICD-10-CM | POA: Diagnosis not present

## 2022-10-26 DIAGNOSIS — E43 Unspecified severe protein-calorie malnutrition: Secondary | ICD-10-CM | POA: Diagnosis not present

## 2022-10-26 DIAGNOSIS — I700 Atherosclerosis of aorta: Secondary | ICD-10-CM | POA: Diagnosis not present

## 2022-10-26 DIAGNOSIS — K766 Portal hypertension: Secondary | ICD-10-CM | POA: Diagnosis not present

## 2022-10-26 DIAGNOSIS — Z438 Encounter for attention to other artificial openings: Secondary | ICD-10-CM | POA: Diagnosis not present

## 2022-10-26 DIAGNOSIS — Z452 Encounter for adjustment and management of vascular access device: Secondary | ICD-10-CM | POA: Diagnosis not present

## 2022-10-26 DIAGNOSIS — C782 Secondary malignant neoplasm of pleura: Secondary | ICD-10-CM | POA: Diagnosis not present

## 2022-10-26 DIAGNOSIS — C50111 Malignant neoplasm of central portion of right female breast: Secondary | ICD-10-CM | POA: Diagnosis not present

## 2022-10-26 LAB — CYTOLOGY - NON PAP

## 2022-10-27 DIAGNOSIS — D696 Thrombocytopenia, unspecified: Secondary | ICD-10-CM | POA: Diagnosis not present

## 2022-10-27 DIAGNOSIS — I700 Atherosclerosis of aorta: Secondary | ICD-10-CM | POA: Diagnosis not present

## 2022-10-27 DIAGNOSIS — Z681 Body mass index (BMI) 19 or less, adult: Secondary | ICD-10-CM | POA: Diagnosis not present

## 2022-10-27 DIAGNOSIS — R161 Splenomegaly, not elsewhere classified: Secondary | ICD-10-CM | POA: Diagnosis not present

## 2022-10-27 DIAGNOSIS — D61818 Other pancytopenia: Secondary | ICD-10-CM | POA: Diagnosis not present

## 2022-10-27 DIAGNOSIS — C782 Secondary malignant neoplasm of pleura: Secondary | ICD-10-CM | POA: Diagnosis not present

## 2022-10-27 DIAGNOSIS — J90 Pleural effusion, not elsewhere classified: Secondary | ICD-10-CM | POA: Diagnosis not present

## 2022-10-27 DIAGNOSIS — R188 Other ascites: Secondary | ICD-10-CM | POA: Diagnosis not present

## 2022-10-27 DIAGNOSIS — E43 Unspecified severe protein-calorie malnutrition: Secondary | ICD-10-CM | POA: Diagnosis not present

## 2022-10-27 DIAGNOSIS — C220 Liver cell carcinoma: Secondary | ICD-10-CM | POA: Diagnosis not present

## 2022-10-27 DIAGNOSIS — C50111 Malignant neoplasm of central portion of right female breast: Secondary | ICD-10-CM | POA: Diagnosis not present

## 2022-10-27 DIAGNOSIS — Z438 Encounter for attention to other artificial openings: Secondary | ICD-10-CM | POA: Diagnosis not present

## 2022-10-27 DIAGNOSIS — K766 Portal hypertension: Secondary | ICD-10-CM | POA: Diagnosis not present

## 2022-10-27 DIAGNOSIS — Z452 Encounter for adjustment and management of vascular access device: Secondary | ICD-10-CM | POA: Diagnosis not present

## 2022-10-27 DIAGNOSIS — Z8619 Personal history of other infectious and parasitic diseases: Secondary | ICD-10-CM | POA: Diagnosis not present

## 2022-10-27 DIAGNOSIS — Z8639 Personal history of other endocrine, nutritional and metabolic disease: Secondary | ICD-10-CM | POA: Diagnosis not present

## 2022-10-28 DIAGNOSIS — C782 Secondary malignant neoplasm of pleura: Secondary | ICD-10-CM | POA: Diagnosis not present

## 2022-10-28 DIAGNOSIS — Z438 Encounter for attention to other artificial openings: Secondary | ICD-10-CM | POA: Diagnosis not present

## 2022-10-28 DIAGNOSIS — R161 Splenomegaly, not elsewhere classified: Secondary | ICD-10-CM | POA: Diagnosis not present

## 2022-10-28 DIAGNOSIS — Z8639 Personal history of other endocrine, nutritional and metabolic disease: Secondary | ICD-10-CM | POA: Diagnosis not present

## 2022-10-28 DIAGNOSIS — Z452 Encounter for adjustment and management of vascular access device: Secondary | ICD-10-CM | POA: Diagnosis not present

## 2022-10-28 DIAGNOSIS — E43 Unspecified severe protein-calorie malnutrition: Secondary | ICD-10-CM | POA: Diagnosis not present

## 2022-10-28 DIAGNOSIS — C50111 Malignant neoplasm of central portion of right female breast: Secondary | ICD-10-CM | POA: Diagnosis not present

## 2022-10-28 DIAGNOSIS — Z8619 Personal history of other infectious and parasitic diseases: Secondary | ICD-10-CM | POA: Diagnosis not present

## 2022-10-28 DIAGNOSIS — D696 Thrombocytopenia, unspecified: Secondary | ICD-10-CM | POA: Diagnosis not present

## 2022-10-28 DIAGNOSIS — C220 Liver cell carcinoma: Secondary | ICD-10-CM | POA: Diagnosis not present

## 2022-10-28 DIAGNOSIS — D61818 Other pancytopenia: Secondary | ICD-10-CM | POA: Diagnosis not present

## 2022-10-28 DIAGNOSIS — K766 Portal hypertension: Secondary | ICD-10-CM | POA: Diagnosis not present

## 2022-10-28 DIAGNOSIS — Z681 Body mass index (BMI) 19 or less, adult: Secondary | ICD-10-CM | POA: Diagnosis not present

## 2022-10-28 DIAGNOSIS — R188 Other ascites: Secondary | ICD-10-CM | POA: Diagnosis not present

## 2022-10-28 DIAGNOSIS — I700 Atherosclerosis of aorta: Secondary | ICD-10-CM | POA: Diagnosis not present

## 2022-10-28 DIAGNOSIS — J90 Pleural effusion, not elsewhere classified: Secondary | ICD-10-CM | POA: Diagnosis not present

## 2022-10-29 DIAGNOSIS — C782 Secondary malignant neoplasm of pleura: Secondary | ICD-10-CM | POA: Diagnosis not present

## 2022-10-29 DIAGNOSIS — D61818 Other pancytopenia: Secondary | ICD-10-CM | POA: Diagnosis not present

## 2022-10-29 DIAGNOSIS — R161 Splenomegaly, not elsewhere classified: Secondary | ICD-10-CM | POA: Diagnosis not present

## 2022-10-29 DIAGNOSIS — J90 Pleural effusion, not elsewhere classified: Secondary | ICD-10-CM | POA: Diagnosis not present

## 2022-10-29 DIAGNOSIS — C50111 Malignant neoplasm of central portion of right female breast: Secondary | ICD-10-CM | POA: Diagnosis not present

## 2022-10-29 DIAGNOSIS — Z438 Encounter for attention to other artificial openings: Secondary | ICD-10-CM | POA: Diagnosis not present

## 2022-10-29 DIAGNOSIS — D696 Thrombocytopenia, unspecified: Secondary | ICD-10-CM | POA: Diagnosis not present

## 2022-10-29 DIAGNOSIS — K766 Portal hypertension: Secondary | ICD-10-CM | POA: Diagnosis not present

## 2022-10-29 DIAGNOSIS — Z8619 Personal history of other infectious and parasitic diseases: Secondary | ICD-10-CM | POA: Diagnosis not present

## 2022-10-29 DIAGNOSIS — C220 Liver cell carcinoma: Secondary | ICD-10-CM | POA: Diagnosis not present

## 2022-10-29 DIAGNOSIS — Z681 Body mass index (BMI) 19 or less, adult: Secondary | ICD-10-CM | POA: Diagnosis not present

## 2022-10-29 DIAGNOSIS — R188 Other ascites: Secondary | ICD-10-CM | POA: Diagnosis not present

## 2022-10-29 DIAGNOSIS — Z8639 Personal history of other endocrine, nutritional and metabolic disease: Secondary | ICD-10-CM | POA: Diagnosis not present

## 2022-10-29 DIAGNOSIS — I700 Atherosclerosis of aorta: Secondary | ICD-10-CM | POA: Diagnosis not present

## 2022-10-29 DIAGNOSIS — E43 Unspecified severe protein-calorie malnutrition: Secondary | ICD-10-CM | POA: Diagnosis not present

## 2022-10-29 DIAGNOSIS — Z452 Encounter for adjustment and management of vascular access device: Secondary | ICD-10-CM | POA: Diagnosis not present

## 2022-10-30 DIAGNOSIS — E43 Unspecified severe protein-calorie malnutrition: Secondary | ICD-10-CM | POA: Diagnosis not present

## 2022-10-30 DIAGNOSIS — Z8619 Personal history of other infectious and parasitic diseases: Secondary | ICD-10-CM | POA: Diagnosis not present

## 2022-10-30 DIAGNOSIS — Z681 Body mass index (BMI) 19 or less, adult: Secondary | ICD-10-CM | POA: Diagnosis not present

## 2022-10-30 DIAGNOSIS — R161 Splenomegaly, not elsewhere classified: Secondary | ICD-10-CM | POA: Diagnosis not present

## 2022-10-30 DIAGNOSIS — K766 Portal hypertension: Secondary | ICD-10-CM | POA: Diagnosis not present

## 2022-10-30 DIAGNOSIS — D696 Thrombocytopenia, unspecified: Secondary | ICD-10-CM | POA: Diagnosis not present

## 2022-10-30 DIAGNOSIS — R188 Other ascites: Secondary | ICD-10-CM | POA: Diagnosis not present

## 2022-10-30 DIAGNOSIS — I700 Atherosclerosis of aorta: Secondary | ICD-10-CM | POA: Diagnosis not present

## 2022-10-30 DIAGNOSIS — J90 Pleural effusion, not elsewhere classified: Secondary | ICD-10-CM | POA: Diagnosis not present

## 2022-10-30 DIAGNOSIS — C220 Liver cell carcinoma: Secondary | ICD-10-CM | POA: Diagnosis not present

## 2022-10-30 DIAGNOSIS — D61818 Other pancytopenia: Secondary | ICD-10-CM | POA: Diagnosis not present

## 2022-10-30 DIAGNOSIS — Z452 Encounter for adjustment and management of vascular access device: Secondary | ICD-10-CM | POA: Diagnosis not present

## 2022-10-30 DIAGNOSIS — C50111 Malignant neoplasm of central portion of right female breast: Secondary | ICD-10-CM | POA: Diagnosis not present

## 2022-10-30 DIAGNOSIS — Z8639 Personal history of other endocrine, nutritional and metabolic disease: Secondary | ICD-10-CM | POA: Diagnosis not present

## 2022-10-30 DIAGNOSIS — C782 Secondary malignant neoplasm of pleura: Secondary | ICD-10-CM | POA: Diagnosis not present

## 2022-10-30 DIAGNOSIS — Z438 Encounter for attention to other artificial openings: Secondary | ICD-10-CM | POA: Diagnosis not present

## 2022-10-31 DIAGNOSIS — Z681 Body mass index (BMI) 19 or less, adult: Secondary | ICD-10-CM | POA: Diagnosis not present

## 2022-10-31 DIAGNOSIS — I700 Atherosclerosis of aorta: Secondary | ICD-10-CM | POA: Diagnosis not present

## 2022-10-31 DIAGNOSIS — C782 Secondary malignant neoplasm of pleura: Secondary | ICD-10-CM | POA: Diagnosis not present

## 2022-10-31 DIAGNOSIS — Z452 Encounter for adjustment and management of vascular access device: Secondary | ICD-10-CM | POA: Diagnosis not present

## 2022-10-31 DIAGNOSIS — C220 Liver cell carcinoma: Secondary | ICD-10-CM | POA: Diagnosis not present

## 2022-10-31 DIAGNOSIS — R161 Splenomegaly, not elsewhere classified: Secondary | ICD-10-CM | POA: Diagnosis not present

## 2022-10-31 DIAGNOSIS — C50111 Malignant neoplasm of central portion of right female breast: Secondary | ICD-10-CM | POA: Diagnosis not present

## 2022-10-31 DIAGNOSIS — E43 Unspecified severe protein-calorie malnutrition: Secondary | ICD-10-CM | POA: Diagnosis not present

## 2022-10-31 DIAGNOSIS — Z438 Encounter for attention to other artificial openings: Secondary | ICD-10-CM | POA: Diagnosis not present

## 2022-10-31 DIAGNOSIS — Z8639 Personal history of other endocrine, nutritional and metabolic disease: Secondary | ICD-10-CM | POA: Diagnosis not present

## 2022-10-31 DIAGNOSIS — D696 Thrombocytopenia, unspecified: Secondary | ICD-10-CM | POA: Diagnosis not present

## 2022-10-31 DIAGNOSIS — Z8619 Personal history of other infectious and parasitic diseases: Secondary | ICD-10-CM | POA: Diagnosis not present

## 2022-10-31 DIAGNOSIS — R188 Other ascites: Secondary | ICD-10-CM | POA: Diagnosis not present

## 2022-10-31 DIAGNOSIS — D61818 Other pancytopenia: Secondary | ICD-10-CM | POA: Diagnosis not present

## 2022-10-31 DIAGNOSIS — K766 Portal hypertension: Secondary | ICD-10-CM | POA: Diagnosis not present

## 2022-10-31 DIAGNOSIS — J90 Pleural effusion, not elsewhere classified: Secondary | ICD-10-CM | POA: Diagnosis not present

## 2022-11-01 DIAGNOSIS — I700 Atherosclerosis of aorta: Secondary | ICD-10-CM | POA: Diagnosis not present

## 2022-11-01 DIAGNOSIS — Z8619 Personal history of other infectious and parasitic diseases: Secondary | ICD-10-CM | POA: Diagnosis not present

## 2022-11-01 DIAGNOSIS — Z452 Encounter for adjustment and management of vascular access device: Secondary | ICD-10-CM | POA: Diagnosis not present

## 2022-11-01 DIAGNOSIS — Z8639 Personal history of other endocrine, nutritional and metabolic disease: Secondary | ICD-10-CM | POA: Diagnosis not present

## 2022-11-01 DIAGNOSIS — K766 Portal hypertension: Secondary | ICD-10-CM | POA: Diagnosis not present

## 2022-11-01 DIAGNOSIS — E43 Unspecified severe protein-calorie malnutrition: Secondary | ICD-10-CM | POA: Diagnosis not present

## 2022-11-01 DIAGNOSIS — C50111 Malignant neoplasm of central portion of right female breast: Secondary | ICD-10-CM | POA: Diagnosis not present

## 2022-11-01 DIAGNOSIS — C782 Secondary malignant neoplasm of pleura: Secondary | ICD-10-CM | POA: Diagnosis not present

## 2022-11-01 DIAGNOSIS — J90 Pleural effusion, not elsewhere classified: Secondary | ICD-10-CM | POA: Diagnosis not present

## 2022-11-01 DIAGNOSIS — Z681 Body mass index (BMI) 19 or less, adult: Secondary | ICD-10-CM | POA: Diagnosis not present

## 2022-11-01 DIAGNOSIS — Z438 Encounter for attention to other artificial openings: Secondary | ICD-10-CM | POA: Diagnosis not present

## 2022-11-01 DIAGNOSIS — R161 Splenomegaly, not elsewhere classified: Secondary | ICD-10-CM | POA: Diagnosis not present

## 2022-11-01 DIAGNOSIS — R188 Other ascites: Secondary | ICD-10-CM | POA: Diagnosis not present

## 2022-11-01 DIAGNOSIS — D696 Thrombocytopenia, unspecified: Secondary | ICD-10-CM | POA: Diagnosis not present

## 2022-11-01 DIAGNOSIS — C220 Liver cell carcinoma: Secondary | ICD-10-CM | POA: Diagnosis not present

## 2022-11-01 DIAGNOSIS — D61818 Other pancytopenia: Secondary | ICD-10-CM | POA: Diagnosis not present

## 2022-11-02 DIAGNOSIS — K766 Portal hypertension: Secondary | ICD-10-CM | POA: Diagnosis not present

## 2022-11-02 DIAGNOSIS — R161 Splenomegaly, not elsewhere classified: Secondary | ICD-10-CM | POA: Diagnosis not present

## 2022-11-02 DIAGNOSIS — C782 Secondary malignant neoplasm of pleura: Secondary | ICD-10-CM | POA: Diagnosis not present

## 2022-11-02 DIAGNOSIS — I700 Atherosclerosis of aorta: Secondary | ICD-10-CM | POA: Diagnosis not present

## 2022-11-02 DIAGNOSIS — J90 Pleural effusion, not elsewhere classified: Secondary | ICD-10-CM | POA: Diagnosis not present

## 2022-11-02 DIAGNOSIS — Z452 Encounter for adjustment and management of vascular access device: Secondary | ICD-10-CM | POA: Diagnosis not present

## 2022-11-02 DIAGNOSIS — Z438 Encounter for attention to other artificial openings: Secondary | ICD-10-CM | POA: Diagnosis not present

## 2022-11-02 DIAGNOSIS — Z681 Body mass index (BMI) 19 or less, adult: Secondary | ICD-10-CM | POA: Diagnosis not present

## 2022-11-02 DIAGNOSIS — Z8619 Personal history of other infectious and parasitic diseases: Secondary | ICD-10-CM | POA: Diagnosis not present

## 2022-11-02 DIAGNOSIS — E43 Unspecified severe protein-calorie malnutrition: Secondary | ICD-10-CM | POA: Diagnosis not present

## 2022-11-02 DIAGNOSIS — D696 Thrombocytopenia, unspecified: Secondary | ICD-10-CM | POA: Diagnosis not present

## 2022-11-02 DIAGNOSIS — D61818 Other pancytopenia: Secondary | ICD-10-CM | POA: Diagnosis not present

## 2022-11-02 DIAGNOSIS — Z8639 Personal history of other endocrine, nutritional and metabolic disease: Secondary | ICD-10-CM | POA: Diagnosis not present

## 2022-11-02 DIAGNOSIS — C220 Liver cell carcinoma: Secondary | ICD-10-CM | POA: Diagnosis not present

## 2022-11-02 DIAGNOSIS — R188 Other ascites: Secondary | ICD-10-CM | POA: Diagnosis not present

## 2022-11-02 DIAGNOSIS — C50111 Malignant neoplasm of central portion of right female breast: Secondary | ICD-10-CM | POA: Diagnosis not present

## 2022-11-03 DIAGNOSIS — E43 Unspecified severe protein-calorie malnutrition: Secondary | ICD-10-CM | POA: Diagnosis not present

## 2022-11-03 DIAGNOSIS — Z8619 Personal history of other infectious and parasitic diseases: Secondary | ICD-10-CM | POA: Diagnosis not present

## 2022-11-03 DIAGNOSIS — Z8639 Personal history of other endocrine, nutritional and metabolic disease: Secondary | ICD-10-CM | POA: Diagnosis not present

## 2022-11-03 DIAGNOSIS — R188 Other ascites: Secondary | ICD-10-CM | POA: Diagnosis not present

## 2022-11-03 DIAGNOSIS — Z438 Encounter for attention to other artificial openings: Secondary | ICD-10-CM | POA: Diagnosis not present

## 2022-11-03 DIAGNOSIS — C220 Liver cell carcinoma: Secondary | ICD-10-CM | POA: Diagnosis not present

## 2022-11-03 DIAGNOSIS — Z681 Body mass index (BMI) 19 or less, adult: Secondary | ICD-10-CM | POA: Diagnosis not present

## 2022-11-03 DIAGNOSIS — C782 Secondary malignant neoplasm of pleura: Secondary | ICD-10-CM | POA: Diagnosis not present

## 2022-11-03 DIAGNOSIS — C50111 Malignant neoplasm of central portion of right female breast: Secondary | ICD-10-CM | POA: Diagnosis not present

## 2022-11-03 DIAGNOSIS — D696 Thrombocytopenia, unspecified: Secondary | ICD-10-CM | POA: Diagnosis not present

## 2022-11-03 DIAGNOSIS — Z452 Encounter for adjustment and management of vascular access device: Secondary | ICD-10-CM | POA: Diagnosis not present

## 2022-11-03 DIAGNOSIS — K766 Portal hypertension: Secondary | ICD-10-CM | POA: Diagnosis not present

## 2022-11-03 DIAGNOSIS — D61818 Other pancytopenia: Secondary | ICD-10-CM | POA: Diagnosis not present

## 2022-11-03 DIAGNOSIS — I700 Atherosclerosis of aorta: Secondary | ICD-10-CM | POA: Diagnosis not present

## 2022-11-03 DIAGNOSIS — R161 Splenomegaly, not elsewhere classified: Secondary | ICD-10-CM | POA: Diagnosis not present

## 2022-11-03 DIAGNOSIS — J90 Pleural effusion, not elsewhere classified: Secondary | ICD-10-CM | POA: Diagnosis not present

## 2022-11-04 ENCOUNTER — Other Ambulatory Visit: Payer: Self-pay

## 2022-11-04 ENCOUNTER — Other Ambulatory Visit (HOSPITAL_COMMUNITY): Payer: Self-pay | Admitting: Hospice and Palliative Medicine

## 2022-11-04 DIAGNOSIS — J90 Pleural effusion, not elsewhere classified: Secondary | ICD-10-CM

## 2022-11-04 DIAGNOSIS — Z8639 Personal history of other endocrine, nutritional and metabolic disease: Secondary | ICD-10-CM | POA: Diagnosis not present

## 2022-11-04 DIAGNOSIS — C220 Liver cell carcinoma: Secondary | ICD-10-CM

## 2022-11-04 DIAGNOSIS — Z438 Encounter for attention to other artificial openings: Secondary | ICD-10-CM | POA: Diagnosis not present

## 2022-11-04 DIAGNOSIS — C782 Secondary malignant neoplasm of pleura: Secondary | ICD-10-CM | POA: Diagnosis not present

## 2022-11-04 DIAGNOSIS — C50111 Malignant neoplasm of central portion of right female breast: Secondary | ICD-10-CM | POA: Diagnosis not present

## 2022-11-04 DIAGNOSIS — K766 Portal hypertension: Secondary | ICD-10-CM | POA: Diagnosis not present

## 2022-11-04 DIAGNOSIS — I700 Atherosclerosis of aorta: Secondary | ICD-10-CM | POA: Diagnosis not present

## 2022-11-04 DIAGNOSIS — Z452 Encounter for adjustment and management of vascular access device: Secondary | ICD-10-CM | POA: Diagnosis not present

## 2022-11-04 DIAGNOSIS — D696 Thrombocytopenia, unspecified: Secondary | ICD-10-CM | POA: Diagnosis not present

## 2022-11-04 DIAGNOSIS — R188 Other ascites: Secondary | ICD-10-CM | POA: Diagnosis not present

## 2022-11-04 DIAGNOSIS — Z8619 Personal history of other infectious and parasitic diseases: Secondary | ICD-10-CM | POA: Diagnosis not present

## 2022-11-04 DIAGNOSIS — R161 Splenomegaly, not elsewhere classified: Secondary | ICD-10-CM | POA: Diagnosis not present

## 2022-11-04 DIAGNOSIS — Z681 Body mass index (BMI) 19 or less, adult: Secondary | ICD-10-CM | POA: Diagnosis not present

## 2022-11-04 DIAGNOSIS — E43 Unspecified severe protein-calorie malnutrition: Secondary | ICD-10-CM | POA: Diagnosis not present

## 2022-11-04 DIAGNOSIS — J910 Malignant pleural effusion: Secondary | ICD-10-CM

## 2022-11-04 DIAGNOSIS — D61818 Other pancytopenia: Secondary | ICD-10-CM | POA: Diagnosis not present

## 2022-11-05 DIAGNOSIS — R161 Splenomegaly, not elsewhere classified: Secondary | ICD-10-CM | POA: Diagnosis not present

## 2022-11-05 DIAGNOSIS — J90 Pleural effusion, not elsewhere classified: Secondary | ICD-10-CM | POA: Diagnosis not present

## 2022-11-05 DIAGNOSIS — Z452 Encounter for adjustment and management of vascular access device: Secondary | ICD-10-CM | POA: Diagnosis not present

## 2022-11-05 DIAGNOSIS — R188 Other ascites: Secondary | ICD-10-CM | POA: Diagnosis not present

## 2022-11-05 DIAGNOSIS — Z8619 Personal history of other infectious and parasitic diseases: Secondary | ICD-10-CM | POA: Diagnosis not present

## 2022-11-05 DIAGNOSIS — Z8639 Personal history of other endocrine, nutritional and metabolic disease: Secondary | ICD-10-CM | POA: Diagnosis not present

## 2022-11-05 DIAGNOSIS — I700 Atherosclerosis of aorta: Secondary | ICD-10-CM | POA: Diagnosis not present

## 2022-11-05 DIAGNOSIS — Z681 Body mass index (BMI) 19 or less, adult: Secondary | ICD-10-CM | POA: Diagnosis not present

## 2022-11-05 DIAGNOSIS — Z438 Encounter for attention to other artificial openings: Secondary | ICD-10-CM | POA: Diagnosis not present

## 2022-11-05 DIAGNOSIS — D61818 Other pancytopenia: Secondary | ICD-10-CM | POA: Diagnosis not present

## 2022-11-05 DIAGNOSIS — C220 Liver cell carcinoma: Secondary | ICD-10-CM | POA: Diagnosis not present

## 2022-11-05 DIAGNOSIS — K766 Portal hypertension: Secondary | ICD-10-CM | POA: Diagnosis not present

## 2022-11-05 DIAGNOSIS — C782 Secondary malignant neoplasm of pleura: Secondary | ICD-10-CM | POA: Diagnosis not present

## 2022-11-05 DIAGNOSIS — D696 Thrombocytopenia, unspecified: Secondary | ICD-10-CM | POA: Diagnosis not present

## 2022-11-05 DIAGNOSIS — C50111 Malignant neoplasm of central portion of right female breast: Secondary | ICD-10-CM | POA: Diagnosis not present

## 2022-11-05 DIAGNOSIS — E43 Unspecified severe protein-calorie malnutrition: Secondary | ICD-10-CM | POA: Diagnosis not present

## 2022-11-06 DIAGNOSIS — Z8619 Personal history of other infectious and parasitic diseases: Secondary | ICD-10-CM | POA: Diagnosis not present

## 2022-11-06 DIAGNOSIS — R161 Splenomegaly, not elsewhere classified: Secondary | ICD-10-CM | POA: Diagnosis not present

## 2022-11-06 DIAGNOSIS — Z681 Body mass index (BMI) 19 or less, adult: Secondary | ICD-10-CM | POA: Diagnosis not present

## 2022-11-06 DIAGNOSIS — D61818 Other pancytopenia: Secondary | ICD-10-CM | POA: Diagnosis not present

## 2022-11-06 DIAGNOSIS — D696 Thrombocytopenia, unspecified: Secondary | ICD-10-CM | POA: Diagnosis not present

## 2022-11-06 DIAGNOSIS — Z438 Encounter for attention to other artificial openings: Secondary | ICD-10-CM | POA: Diagnosis not present

## 2022-11-06 DIAGNOSIS — J90 Pleural effusion, not elsewhere classified: Secondary | ICD-10-CM | POA: Diagnosis not present

## 2022-11-06 DIAGNOSIS — Z8639 Personal history of other endocrine, nutritional and metabolic disease: Secondary | ICD-10-CM | POA: Diagnosis not present

## 2022-11-06 DIAGNOSIS — R188 Other ascites: Secondary | ICD-10-CM | POA: Diagnosis not present

## 2022-11-06 DIAGNOSIS — Z452 Encounter for adjustment and management of vascular access device: Secondary | ICD-10-CM | POA: Diagnosis not present

## 2022-11-06 DIAGNOSIS — C50111 Malignant neoplasm of central portion of right female breast: Secondary | ICD-10-CM | POA: Diagnosis not present

## 2022-11-06 DIAGNOSIS — K766 Portal hypertension: Secondary | ICD-10-CM | POA: Diagnosis not present

## 2022-11-06 DIAGNOSIS — C782 Secondary malignant neoplasm of pleura: Secondary | ICD-10-CM | POA: Diagnosis not present

## 2022-11-06 DIAGNOSIS — C220 Liver cell carcinoma: Secondary | ICD-10-CM | POA: Diagnosis not present

## 2022-11-06 DIAGNOSIS — E43 Unspecified severe protein-calorie malnutrition: Secondary | ICD-10-CM | POA: Diagnosis not present

## 2022-11-06 DIAGNOSIS — I700 Atherosclerosis of aorta: Secondary | ICD-10-CM | POA: Diagnosis not present

## 2022-11-07 DIAGNOSIS — I700 Atherosclerosis of aorta: Secondary | ICD-10-CM | POA: Diagnosis not present

## 2022-11-07 DIAGNOSIS — J90 Pleural effusion, not elsewhere classified: Secondary | ICD-10-CM | POA: Diagnosis not present

## 2022-11-07 DIAGNOSIS — C50111 Malignant neoplasm of central portion of right female breast: Secondary | ICD-10-CM | POA: Diagnosis not present

## 2022-11-07 DIAGNOSIS — C220 Liver cell carcinoma: Secondary | ICD-10-CM | POA: Diagnosis not present

## 2022-11-07 DIAGNOSIS — D696 Thrombocytopenia, unspecified: Secondary | ICD-10-CM | POA: Diagnosis not present

## 2022-11-07 DIAGNOSIS — K766 Portal hypertension: Secondary | ICD-10-CM | POA: Diagnosis not present

## 2022-11-07 DIAGNOSIS — D61818 Other pancytopenia: Secondary | ICD-10-CM | POA: Diagnosis not present

## 2022-11-07 DIAGNOSIS — Z681 Body mass index (BMI) 19 or less, adult: Secondary | ICD-10-CM | POA: Diagnosis not present

## 2022-11-07 DIAGNOSIS — Z452 Encounter for adjustment and management of vascular access device: Secondary | ICD-10-CM | POA: Diagnosis not present

## 2022-11-07 DIAGNOSIS — E43 Unspecified severe protein-calorie malnutrition: Secondary | ICD-10-CM | POA: Diagnosis not present

## 2022-11-07 DIAGNOSIS — Z8619 Personal history of other infectious and parasitic diseases: Secondary | ICD-10-CM | POA: Diagnosis not present

## 2022-11-07 DIAGNOSIS — R161 Splenomegaly, not elsewhere classified: Secondary | ICD-10-CM | POA: Diagnosis not present

## 2022-11-07 DIAGNOSIS — Z8639 Personal history of other endocrine, nutritional and metabolic disease: Secondary | ICD-10-CM | POA: Diagnosis not present

## 2022-11-07 DIAGNOSIS — R188 Other ascites: Secondary | ICD-10-CM | POA: Diagnosis not present

## 2022-11-07 DIAGNOSIS — C782 Secondary malignant neoplasm of pleura: Secondary | ICD-10-CM | POA: Diagnosis not present

## 2022-11-07 DIAGNOSIS — Z438 Encounter for attention to other artificial openings: Secondary | ICD-10-CM | POA: Diagnosis not present

## 2022-11-08 ENCOUNTER — Inpatient Hospital Stay: Payer: BC Managed Care – PPO | Attending: Hematology

## 2022-11-08 ENCOUNTER — Inpatient Hospital Stay: Payer: BC Managed Care – PPO

## 2022-11-08 ENCOUNTER — Other Ambulatory Visit: Payer: Self-pay

## 2022-11-08 ENCOUNTER — Other Ambulatory Visit: Payer: Self-pay | Admitting: Radiology

## 2022-11-08 ENCOUNTER — Telehealth: Payer: Self-pay

## 2022-11-08 DIAGNOSIS — C220 Liver cell carcinoma: Secondary | ICD-10-CM | POA: Insufficient documentation

## 2022-11-08 DIAGNOSIS — R180 Malignant ascites: Secondary | ICD-10-CM | POA: Diagnosis not present

## 2022-11-08 DIAGNOSIS — D61818 Other pancytopenia: Secondary | ICD-10-CM | POA: Diagnosis not present

## 2022-11-08 DIAGNOSIS — Z438 Encounter for attention to other artificial openings: Secondary | ICD-10-CM | POA: Diagnosis not present

## 2022-11-08 DIAGNOSIS — D649 Anemia, unspecified: Secondary | ICD-10-CM

## 2022-11-08 DIAGNOSIS — C50919 Malignant neoplasm of unspecified site of unspecified female breast: Secondary | ICD-10-CM | POA: Diagnosis not present

## 2022-11-08 DIAGNOSIS — E43 Unspecified severe protein-calorie malnutrition: Secondary | ICD-10-CM | POA: Diagnosis not present

## 2022-11-08 DIAGNOSIS — Z79811 Long term (current) use of aromatase inhibitors: Secondary | ICD-10-CM | POA: Diagnosis not present

## 2022-11-08 DIAGNOSIS — D696 Thrombocytopenia, unspecified: Secondary | ICD-10-CM

## 2022-11-08 DIAGNOSIS — R161 Splenomegaly, not elsewhere classified: Secondary | ICD-10-CM | POA: Diagnosis not present

## 2022-11-08 DIAGNOSIS — C50111 Malignant neoplasm of central portion of right female breast: Secondary | ICD-10-CM | POA: Insufficient documentation

## 2022-11-08 DIAGNOSIS — I700 Atherosclerosis of aorta: Secondary | ICD-10-CM | POA: Diagnosis not present

## 2022-11-08 DIAGNOSIS — R188 Other ascites: Secondary | ICD-10-CM | POA: Diagnosis not present

## 2022-11-08 DIAGNOSIS — K766 Portal hypertension: Secondary | ICD-10-CM | POA: Diagnosis not present

## 2022-11-08 DIAGNOSIS — J90 Pleural effusion, not elsewhere classified: Secondary | ICD-10-CM | POA: Diagnosis not present

## 2022-11-08 DIAGNOSIS — C782 Secondary malignant neoplasm of pleura: Secondary | ICD-10-CM | POA: Diagnosis not present

## 2022-11-08 DIAGNOSIS — Z8639 Personal history of other endocrine, nutritional and metabolic disease: Secondary | ICD-10-CM | POA: Diagnosis not present

## 2022-11-08 DIAGNOSIS — Z170 Estrogen receptor positive status [ER+]: Secondary | ICD-10-CM | POA: Diagnosis not present

## 2022-11-08 DIAGNOSIS — Z452 Encounter for adjustment and management of vascular access device: Secondary | ICD-10-CM | POA: Diagnosis not present

## 2022-11-08 DIAGNOSIS — Z681 Body mass index (BMI) 19 or less, adult: Secondary | ICD-10-CM | POA: Diagnosis not present

## 2022-11-08 DIAGNOSIS — Z8619 Personal history of other infectious and parasitic diseases: Secondary | ICD-10-CM | POA: Diagnosis not present

## 2022-11-08 LAB — CBC WITH DIFFERENTIAL (CANCER CENTER ONLY)
Abs Immature Granulocytes: 0.09 10*3/uL — ABNORMAL HIGH (ref 0.00–0.07)
Basophils Absolute: 0.1 10*3/uL (ref 0.0–0.1)
Basophils Relative: 1 %
Eosinophils Absolute: 0.3 10*3/uL (ref 0.0–0.5)
Eosinophils Relative: 3 %
HCT: 32.2 % — ABNORMAL LOW (ref 36.0–46.0)
Hemoglobin: 10.4 g/dL — ABNORMAL LOW (ref 12.0–15.0)
Immature Granulocytes: 1 %
Lymphocytes Relative: 5 %
Lymphs Abs: 0.5 10*3/uL — ABNORMAL LOW (ref 0.7–4.0)
MCH: 25.7 pg — ABNORMAL LOW (ref 26.0–34.0)
MCHC: 32.3 g/dL (ref 30.0–36.0)
MCV: 79.7 fL — ABNORMAL LOW (ref 80.0–100.0)
Monocytes Absolute: 1.4 10*3/uL — ABNORMAL HIGH (ref 0.1–1.0)
Monocytes Relative: 16 %
Neutro Abs: 6.2 10*3/uL (ref 1.7–7.7)
Neutrophils Relative %: 74 %
Platelet Count: 97 10*3/uL — ABNORMAL LOW (ref 150–400)
RBC: 4.04 MIL/uL (ref 3.87–5.11)
RDW: 25.6 % — ABNORMAL HIGH (ref 11.5–15.5)
WBC Count: 8.4 10*3/uL (ref 4.0–10.5)
nRBC: 0 % (ref 0.0–0.2)

## 2022-11-08 LAB — CMP (CANCER CENTER ONLY)
ALT: 21 U/L (ref 0–44)
AST: 34 U/L (ref 15–41)
Albumin: 2.7 g/dL — ABNORMAL LOW (ref 3.5–5.0)
Alkaline Phosphatase: 77 U/L (ref 38–126)
Anion gap: 5 (ref 5–15)
BUN: 33 mg/dL — ABNORMAL HIGH (ref 6–20)
CO2: 21 mmol/L — ABNORMAL LOW (ref 22–32)
Calcium: 8.4 mg/dL — ABNORMAL LOW (ref 8.9–10.3)
Chloride: 97 mmol/L — ABNORMAL LOW (ref 98–111)
Creatinine: 0.73 mg/dL (ref 0.44–1.00)
GFR, Estimated: >60 mL/min (ref >60)
Glucose, Bld: 119 mg/dL — ABNORMAL HIGH (ref 70–99)
Potassium: 5 mmol/L (ref 3.5–5.1)
Sodium: 123 mmol/L — ABNORMAL LOW (ref 135–145)
Total Bilirubin: 1.3 mg/dL — ABNORMAL HIGH (ref 0.3–1.2)
Total Protein: 6 g/dL — ABNORMAL LOW (ref 6.5–8.1)

## 2022-11-08 LAB — SAMPLE TO BLOOD BANK

## 2022-11-08 LAB — PROTIME-INR
INR: 1.4 — ABNORMAL HIGH (ref 0.8–1.2)
Prothrombin Time: 17.1 seconds — ABNORMAL HIGH (ref 11.4–15.2)

## 2022-11-08 NOTE — H&P (Signed)
Referring Physician(s): Micheline Rough  Supervising Physician: Mir, Sharen Heck  Patient Status:  [facility]  Chief Complaint:  Dyspnea, recurrent right pleural effusion  Subjective: Pt known to IR service from random liver biopsy in 2004, paracenteses in 2020 and 2023, [place] MWA in 2022, multiple thoracenteses(left and right), right hepatic Y-90 treatment 12/22/21, left pleurx cath placement 03/09/22 with replacement on 09/01/22 and port a cath placement on 07/07/22. She has a PMH sig for cirrhosis, COPD, esophageal varices, GERD, IBS, portal HTN, Raynaud's syndrome , anemia/thrombocytopenia, hepatitis C, remote rt breast cancer and multifocal HCC. She continues to have recurrent right pleural effusions and  presents again today for a right pleurx cath placement. She is under hospice care. She denies fever,HA,CP, abd pain, back pain,N/V or bleeding. She does have weight loss, constipation, dyspnea, occ cough.   Past Medical History:  Diagnosis Date   Anemia    Breast cancer ([place]) 08/30/2019   Cancer ([place])    liver- just diagnosed, not started treatment yet   Cervicalgia    COPD (chronic obstructive pulmonary disease) (HCC)    Esophageal varices (HCC)    GERD (gastroesophageal reflux disease)    Headache    Hepatic cirrhosis (HCC)    Hepatitis C    resolved 2018   Hepatocellular carcinoma (HCC)    IBS (irritable bowel syndrome)    Lactose intolerance    Personal history of radiation therapy 11/2019   Right breast   Portal hypertension ([place])    Raynaud's syndrome    Thrombocytopenia ([place])    Past Surgical History:  Procedure Laterality Date   BREAST BIOPSY Right 09/2019   BREAST LUMPECTOMY Right 09/2019   BREAST LUMPECTOMY WITH AXILLARY LYMPH NODE BIOPSY Right 10/16/2019   Procedure: RIGHT BREAST LUMPECTOMY WITH SENTINEL LYMPH NODE BIOPSY;  Surgeon: Stark Klein, MD;  Location: [facility];  Service: General;  Laterality: Right;   COLONOSCOPY     ESOPHAGEAL BANDING  10/27/2021   Procedure:  ESOPHAGEAL BANDING;  Surgeon: Irene Shipper, MD;  [facility];  Service: Endoscopy;;   ESOPHAGOGASTRODUODENOSCOPY (EGD) WITH PROPOFOL N/A 10/27/2021   Procedure: ESOPHAGOGASTRODUODENOSCOPY (EGD) WITH PROPOFOL;  Surgeon: Irene Shipper, MD;  [facility];  Service: Endoscopy;  Laterality: N/A;   ESOPHAGOGASTRODUODENOSCOPY (EGD) WITH PROPOFOL N/A 01/04/2022   Procedure: ESOPHAGOGASTRODUODENOSCOPY (EGD) WITH PROPOFOL;  Surgeon: Irene Shipper, MD;  [facility];  Service: Endoscopy;  Laterality: N/A;   HAMMER TOE SURGERY Left 2012   pins placed in great toe, 2nd,3rd, 4th toes, all pins removed except great toe   IR ANGIOGRAM SELECTIVE EACH ADDITIONAL VESSEL  12/16/2021   IR ANGIOGRAM SELECTIVE EACH ADDITIONAL VESSEL  12/16/2021   IR ANGIOGRAM SELECTIVE EACH ADDITIONAL VESSEL  12/22/2021   IR ANGIOGRAM VISCERAL SELECTIVE  12/16/2021   IR ANGIOGRAM VISCERAL SELECTIVE  12/22/2021   IR EMBO TUMOR ORGAN ISCHEMIA INFARCT INC GUIDE ROADMAPPING  12/22/2021   IR IMAGING GUIDED PORT INSERTION  07/07/2022   IR PARACENTESIS  11/13/2019   IR PARACENTESIS  03/04/2022   IR PATIENT EVAL TECH 0-60 MINS  05/19/2022   IR PERC PLEURAL DRAIN W/INDWELL CATH W/IMG GUIDE  03/09/2022   IR PERC PLEURAL DRAIN W/INDWELL CATH W/IMG GUIDE  09/01/2022   IR RADIOLOGIST EVAL & MGMT  08/16/2019   IR RADIOLOGIST EVAL & MGMT  02/06/2020   IR RADIOLOGIST EVAL & MGMT  06/26/2020   IR RADIOLOGIST EVAL & MGMT  12/31/2020   IR RADIOLOGIST EVAL & MGMT  07/21/2021   IR RADIOLOGIST  EVAL & MGMT  10/08/2021   IR RADIOLOGIST EVAL & MGMT  01/13/2022   IR RADIOLOGIST EVAL & MGMT  03/05/2022   IR RADIOLOGIST EVAL & MGMT  09/06/2022   IR THORACENTESIS ASP PLEURAL SPACE W/IMG GUIDE  10/12/2021   IR THORACENTESIS ASP PLEURAL SPACE W/IMG GUIDE  10/26/2021   IR THORACENTESIS ASP PLEURAL SPACE W/IMG GUIDE  11/19/2021   IR THORACENTESIS ASP PLEURAL SPACE W/IMG GUIDE  12/22/2021   IR US GUIDE VASC ACCESS RIGHT  12/16/2021   IR US GUIDE VASC  ACCESS RIGHT  12/22/2021   PELVIC LAPAROSCOPY Bilateral 1992   RADIOLOGY WITH ANESTHESIA N/A 09/16/2021   Procedure: CT MICROWAVE ABLATION;  Surgeon: Aletta Edouard, MD;  [facility];  Service: Radiology;  Laterality: N/A;   UPPER GASTROINTESTINAL ENDOSCOPY  2002   had esophageal dilitation      Allergies: Hydromorphone hcl and Aspirin  Medications: Prior to Admission medications   Medication Sig Start Date End Date Taking? Authorizing Provider  ALPRAZolam Duanne Moron) 0.5 MG tablet Take 1 tablet (0.5 mg total) by mouth 2 (two) times daily as needed for anxiety. 09/21/22   Truitt Merle, MD  anastrozole (ARIMIDEX) 1 MG tablet Take 1 tablet (1 mg total) by mouth daily. 08/16/22   Truitt Merle, MD  baclofen (LIORESAL) 10 MG tablet Take 5 mg by mouth daily as needed for muscle spasms. 03/12/22   [provider]  dicyclomine (BENTYL) 20 MG tablet Take 0.5 tablets (10 mg total) by mouth every 6 (six) hours as needed for spasms. Take one tablet every 6-8 hours as needed for abdominal cramping Patient taking differently: Take 20 mg by mouth daily as needed (abdominal cramping). 01/07/22   Noralyn Pick, NP  guaiFENesin-codeine 100-10 MG/5ML syrup Take 5 mLs by mouth every 6 (six) hours as needed for cough. 08/25/22   Truitt Merle, MD  IBRANCE 125 MG tablet TAKE 1 TABLET DAILY FOR 21 DAYS ON, THEN 7 DAYS OFF. REPEAT EVERY 28 DAYS Patient not taking: Reported on 09/08/2022 08/30/22   Truitt Merle, MD  lidocaine-prilocaine (EMLA) cream Apply 1 Application topically as needed. Patient taking differently: Apply 1 Application topically as needed (access port). 07/19/22   Alla Feeling, NP  magic mouthwash (nystatin, lidocaine, diphenhydrAMINE, alum & mag hydroxide) suspension Swish and swallow 5 mLs by mouth 4 (four) times daily as needed for mouth pain. 09/21/22   Truitt Merle, MD  midodrine (PROAMATINE) 5 MG tablet Take 15 mg by mouth 3 (three) times daily with meals.    [provider]   nicotine (NICODERM CQ - DOSED IN MG/24 HOURS) 21 mg/24hr patch Place 21 mg onto the skin daily as needed (smoking cessation on work days).    [provider]  oxyCODONE (OXY IR/ROXICODONE) 5 MG immediate release tablet Take 1 tablet (5 mg total) by mouth every 6 (six) hours as needed for severe pain. 09/21/22   Truitt Merle, MD  pantoprazole (PROTONIX) 40 MG tablet TAKE 1 TABLET BY MOUTH EVERY DAY 07/31/22   Noralyn Pick, NP  promethazine (PHENERGAN) 12.5 MG tablet Take 1 tablet (12.5 mg total) by mouth daily as needed for nausea or vomiting. 08/05/22   Truitt Merle, MD  sucralfate (CARAFATE) 1 GM/10ML suspension Take 10 mLs (1 g total) by mouth 2 (two) times daily for 10 days. Do not take within 2 hours of any other medication 09/21/22 10/01/22  Noralyn Pick, NP     Vital Signs: Vitals:   11/09/22 0757  BP: 90/63  Pulse: 94  Resp: 16  Temp: 98.3 F (36.8 C)  SpO2: 94%    LMP 04/20/2013   Physical Exam:  awake/alert; cachetic appearing; chest- dim BS bases bilat; intact rt chest wall port a cath; left chest pleurx in place, dressing dry;heart- RRR; abd- soft,+BS,NT; no LE edema; left arm bandaged secondary to recent skin abrasion  Imaging: No results found.  Labs:  CBC: Recent Labs    09/07/22 1440 09/14/22 1234 09/21/22 0842 09/23/22 0134  WBC 2.2* 1.2* 4.3 4.2  HGB 9.9* 9.4* 10.3* 10.3*  HCT 30.4* 27.8* 30.6* 31.8*  PLT 16* 13* 68* 90*    COAGS: Recent Labs    04/16/22 0457 06/01/22 1032 09/01/22 0900 09/23/22 0140  INR 1.7* 1.5* 1.5* 1.5*    BMP: Recent Labs    09/07/22 1440 09/14/22 1234 09/21/22 0842 09/23/22 0134  NA 135 129* 127* 128*  K 4.9 4.1 4.1 4.2  CL 108 101 98 100  CO2 '23 24 23 22  '$ GLUCOSE 100* 143* 111* 113*  BUN 34* 21* 19 19  CALCIUM 7.7* 7.7* 8.2* 8.1*  CREATININE 1.01* 0.62 0.63 0.65  GFRNONAA >60 >60 >60 >60    LIVER FUNCTION TESTS: Recent Labs    09/07/22 1440 09/14/22 1234 09/21/22 0842  09/23/22 0134  BILITOT 2.0* 2.4* 2.2* 2.3*  AST '28 29 29 31  '$ ALT '15 15 18 19  '$ ALKPHOS 67 66 69 67  PROT 5.7* 5.8* 5.8* 5.8*  ALBUMIN 2.5* 2.5* 2.5* 2.5*    Assessment and Plan: Pt known to IR service from random liver biopsy in 2004, paracenteses in 2020 and 2023, [place] MWA in 2022, multiple thoracenteses(left and right), right hepatic Y-90 treatment 12/22/21, left pleurx cath placement 03/09/22 with replacement on 09/01/22 and port a cath placement on 07/07/22. She has a PMH sig for cirrhosis, COPD, esophageal varices, GERD, IBS, portal HTN, Raynaud's syndrome , anemia/thrombocytopenia, hepatitis C, remote rt breast cancer and multifocal HCC. She continues to have recurrent right pleural effusions and  presents again today for a right pleurx cath placement. She is under hospice care. Risks and benefits of procedure was discussed with the patient/spouse  including, but not limited to bleeding, infection, damage to adjacent structures /pneumothorax, inability to adequately drain pleural effusion.   All of the questions were answered and there is agreement to proceed.  Consent signed and in chart.    Electronically Signed: D. Rowe Robert, PA-C 11/08/2022, 11:43 AM   I spent a total of 25 minutes at the the patient's bedside AND on the patient's hospital floor or unit, greater than 50% of which was counseling/coordinating care for right pleurx catheter placement

## 2022-11-08 NOTE — Telephone Encounter (Signed)
Lauren in lab called with a curtesy call for a hemoglobin on patient of 10.4

## 2022-11-09 ENCOUNTER — Other Ambulatory Visit: Payer: Self-pay

## 2022-11-09 ENCOUNTER — Ambulatory Visit (HOSPITAL_COMMUNITY)
Admission: RE | Admit: 2022-11-09 | Discharge: 2022-11-09 | Disposition: A | Discharge status: Home / Self Care | Payer: BC Managed Care – PPO | Source: Ambulatory Visit | Attending: Hospice and Palliative Medicine | Admitting: Hospice and Palliative Medicine

## 2022-11-09 ENCOUNTER — Encounter (HOSPITAL_COMMUNITY): Payer: Self-pay

## 2022-11-09 DIAGNOSIS — C220 Liver cell carcinoma: Secondary | ICD-10-CM

## 2022-11-09 DIAGNOSIS — J910 Malignant pleural effusion: Secondary | ICD-10-CM | POA: Insufficient documentation

## 2022-11-09 DIAGNOSIS — C50111 Malignant neoplasm of central portion of right female breast: Secondary | ICD-10-CM | POA: Diagnosis not present

## 2022-11-09 DIAGNOSIS — J449 Chronic obstructive pulmonary disease, unspecified: Secondary | ICD-10-CM | POA: Insufficient documentation

## 2022-11-09 DIAGNOSIS — D696 Thrombocytopenia, unspecified: Secondary | ICD-10-CM | POA: Diagnosis not present

## 2022-11-09 DIAGNOSIS — R188 Other ascites: Secondary | ICD-10-CM | POA: Diagnosis not present

## 2022-11-09 DIAGNOSIS — I700 Atherosclerosis of aorta: Secondary | ICD-10-CM | POA: Diagnosis not present

## 2022-11-09 DIAGNOSIS — K766 Portal hypertension: Secondary | ICD-10-CM | POA: Insufficient documentation

## 2022-11-09 DIAGNOSIS — Z8619 Personal history of other infectious and parasitic diseases: Secondary | ICD-10-CM | POA: Diagnosis not present

## 2022-11-09 DIAGNOSIS — R0600 Dyspnea, unspecified: Secondary | ICD-10-CM | POA: Diagnosis not present

## 2022-11-09 DIAGNOSIS — C782 Secondary malignant neoplasm of pleura: Secondary | ICD-10-CM | POA: Diagnosis not present

## 2022-11-09 DIAGNOSIS — K219 Gastro-esophageal reflux disease without esophagitis: Secondary | ICD-10-CM | POA: Diagnosis not present

## 2022-11-09 DIAGNOSIS — Z8639 Personal history of other endocrine, nutritional and metabolic disease: Secondary | ICD-10-CM | POA: Diagnosis not present

## 2022-11-09 DIAGNOSIS — K7460 Unspecified cirrhosis of liver: Secondary | ICD-10-CM | POA: Diagnosis not present

## 2022-11-09 DIAGNOSIS — Z853 Personal history of malignant neoplasm of breast: Secondary | ICD-10-CM | POA: Insufficient documentation

## 2022-11-09 DIAGNOSIS — Z681 Body mass index (BMI) 19 or less, adult: Secondary | ICD-10-CM | POA: Diagnosis not present

## 2022-11-09 DIAGNOSIS — R161 Splenomegaly, not elsewhere classified: Secondary | ICD-10-CM | POA: Diagnosis not present

## 2022-11-09 DIAGNOSIS — Z452 Encounter for adjustment and management of vascular access device: Secondary | ICD-10-CM | POA: Diagnosis not present

## 2022-11-09 DIAGNOSIS — E43 Unspecified severe protein-calorie malnutrition: Secondary | ICD-10-CM | POA: Diagnosis not present

## 2022-11-09 DIAGNOSIS — J90 Pleural effusion, not elsewhere classified: Secondary | ICD-10-CM | POA: Diagnosis not present

## 2022-11-09 DIAGNOSIS — D61818 Other pancytopenia: Secondary | ICD-10-CM | POA: Diagnosis not present

## 2022-11-09 DIAGNOSIS — Z438 Encounter for attention to other artificial openings: Secondary | ICD-10-CM | POA: Diagnosis not present

## 2022-11-09 HISTORY — PX: IR PERC PLEURAL DRAIN W/INDWELL CATH W/IMG GUIDE: IMG5383

## 2022-11-09 LAB — AFP TUMOR MARKER: AFP, Serum, Tumor Marker: 6 ng/mL (ref 0.0–9.2)

## 2022-11-09 LAB — THROMBIN TIME: Thrombin Time: 19.2 s (ref 0.0–23.0)

## 2022-11-09 MED ORDER — MIDAZOLAM HCL 2 MG/2ML IJ SOLN
INTRAMUSCULAR | Status: AC
  Filled 2022-11-09: qty 2

## 2022-11-09 MED ORDER — FENTANYL CITRATE (PF) 100 MCG/2ML IJ SOLN
INTRAMUSCULAR | Status: AC
  Filled 2022-11-09: qty 2

## 2022-11-09 MED ORDER — HEPARIN SOD (PORK) LOCK FLUSH 100 UNIT/ML IV SOLN
500.0000 [IU] | Freq: Once | INTRAVENOUS | Status: AC
  Administered 2022-11-09: 500 [IU] via INTRAVENOUS
  Filled 2022-11-09: qty 5

## 2022-11-09 MED ORDER — CEFAZOLIN SODIUM-DEXTROSE 2-4 GM/100ML-% IV SOLN
INTRAVENOUS | Status: AC
  Administered 2022-11-09: 2 g via INTRAVENOUS
  Filled 2022-11-09: qty 100

## 2022-11-09 MED ORDER — CEFAZOLIN SODIUM-DEXTROSE 2-4 GM/100ML-% IV SOLN: 2.0000 g | INTRAVENOUS | Status: AC

## 2022-11-09 MED ORDER — MIDAZOLAM HCL 2 MG/2ML IJ SOLN
INTRAMUSCULAR | Status: AC | PRN
  Administered 2022-11-09: .5 mg via INTRAVENOUS

## 2022-11-09 MED ORDER — SODIUM CHLORIDE 0.9 % IV SOLN: INTRAVENOUS | Status: DC

## 2022-11-09 MED ORDER — FENTANYL CITRATE (PF) 100 MCG/2ML IJ SOLN
INTRAMUSCULAR | Status: AC | PRN
  Administered 2022-11-09 (×2): 25 ug via INTRAVENOUS

## 2022-11-09 MED ORDER — LIDOCAINE-EPINEPHRINE 1 %-1:100000 IJ SOLN
INTRAMUSCULAR | Status: AC
  Filled 2022-11-09: qty 1

## 2022-11-09 NOTE — Discharge Instructions (Signed)
Please call Interventional Radiology clinic [phone] with any questions or concerns.  You may remove your dressing and shower tomorrow.   Moderate Conscious Sedation, Adult, Care After This sheet gives you information about how to care for yourself after your procedure. Your health care provider may also give you more specific instructions. If you have problems or questions, contact your health careprovider. What can I expect after the procedure? After the procedure, it is common to have: Sleepiness for several hours. Impaired judgment for several hours. Difficulty with balance. Vomiting if you eat too soon. Follow these instructions at home: For the time period you were told by your health care provider: Rest. Do not participate in activities where you could fall or become injured. Do not drive or use machinery. Do not drink alcohol. Do not take sleeping pills or medicines that cause drowsiness. Do not make important decisions or sign legal documents. Do not take care of children on your own. Eating and drinking  Follow the diet recommended by your health care provider. Drink enough fluid to keep your urine pale yellow. If you vomit: Drink water, juice, or soup when you can drink without vomiting. Make sure you have little or no nausea before eating solid foods.  General instructions Take over-the-counter and prescription medicines only as told by your health care provider. Have a responsible adult stay with you for the time you are told. It is important to have someone help care for you until you are awake and alert. Do not smoke. Keep all follow-up visits as told by your health care provider. This is important. Contact a health care provider if: You are still sleepy or having trouble with balance after 24 hours. You feel light-headed. You keep feeling nauseous or you keep vomiting. You develop a rash. You have a fever. You have redness or swelling around the IV  site. Get help right away if: You have trouble breathing. You have new-onset confusion at home. Summary After the procedure, it is common to feel sleepy, have impaired judgment, or feel nauseous if you eat too soon. Rest after you get home. Know the things you should not do after the procedure. Follow the diet recommended by your health care provider and drink enough fluid to keep your urine pale yellow. Get help right away if you have trouble breathing or new-onset confusion at home. This information is not intended to replace advice given to you by your health care provider. Make sure you discuss any questions you have with your healthcare provider. Document Revised: 03/14/2020 Document Reviewed: 10/11/2019 Elsevier Patient Education  2022 [place]. Indwelling Pleural Catheter Home Guide (Pleurex Drain)  An indwelling pleural catheter is a thin, flexible tube that is inserted under your skin and into your chest. The catheter drains excess fluid that collects in the area between the chest wall and the lungs (pleural space). After the catheter is inserted, it can be attached to a bottle that collects fluid. The pleural catheter will allow you to drain fluid from your chest at home on a regular basis (sometimes daily). This will eliminate the need for frequent visits to the hospital or clinic to drain the fluid. The catheter may be removed after the excess fluid problem is resolved, usually after 2-3 months. It is important to follow instructions from your health care provider about how to drain and care for your catheter. What are the risks? Generally, this is a safe procedure. However, problems may occur, including: Infection. Skin damage around the  catheter. Lung damage. Failure of the chest tube to work properly. Spreading of cancer cells along the catheter, if you have cancer. Supplies needed: Vacuum-sealed drainage bottle with attached drainage line. Sterile dressing. Sterile  alcohol pads. Sterile gloves. Valve cap. Sterile gauze pads, 4  4 inch (10 cm  10 cm). Tape. Adhesive dressing. Sterile foam catheter pad. How to care for your catheter and insertion site Wash your hands with soap and warm water before and after touching the catheter or insertion site. If soap and water are not available, use hand sanitizer. Check your bandage (dressing) daily to make sure it is clean and dry. Keep the skin around the catheter clean and dry. Check the catheter regularly for any cracks or kinks in the tubing. Check your catheter insertion site every day for signs of infection. Check for: Skin breakdown. Redness, swelling, or pain. Fluid or blood. Warmth. Pus or a bad smell. How to drain your catheter You may need to drain your catheter every day, or more or less often as told by your health care provider. Follow instructions from your health care provider about how to drain your catheter. You may also refer to instructions that come with the drainage system. To drain the catheter: Wash your hands with soap and warm water. If soap and water are not available, use hand sanitizer. Carefully remove the dressing from around the catheter. Wash your hands again. Put on the gloves provided. Prepare the vacuum-sealed drainage bottle and drainage line. Close the drainage line of the vacuum-sealed drainage bottle by squeezing the pinch clamp or rolling the wheel of the roller clamp toward the bottle. The vacuum in the bottle will be lost if the line is not closed completely. Remove the access tip cover from the drainage line. Do not touch the end. Set it on a sterile surface. Remove the catheter valve cap and throw it away. Use an alcohol pad to clean the end of the catheter. Insert the access tip into the catheter valve. Make sure the valve and access tip are securely connected. Listen for a click to confirm that they are connected. Insert the T plunger to break the vacuum seal  on the drainage bottle. Open the clamp on the drainage line. Allow the catheter to drain. Keep the catheter and the drainage bottle below the level of your chest. There may be a one-way valve on the end of the tubing that will allow liquid and air to flow out of the catheter without letting air inside. Drain the amount of fluid as told by your health care provider. It usually takes 5-15 minutes. Do not drain more than 1000 mL of fluid. You may feel a little discomfort while you are draining. If the pain is severe, stop draining and contact your health care provider. After you finish draining the catheter, remove the drainage bottle tubing from the catheter. Use a clean alcohol pad to wipe the catheter tip. Place a clean cap on the end of the catheter. Use an alcohol pad to clean the skin around the catheter. Allow the skin to air-dry. Put the catheter pad on your skin. Curl the catheter into loops and place it on the pad. Do not place the catheter on your skin. Replace the dressing over the catheter. Discard the drainage bottle as instructed by your health care provider. Do not reuse the drainage bottle. How to change your dressing Change your dressing at least once a week, or more often if needed to keep the  dressing dry. Be sure to change the dressing whenever it becomes moist. Your health care provider will tell you how often to change your dressing. Wash your hands with soap and warm water. If soap and water are not available, use hand sanitizer. Gently remove the old dressing. Avoid using scissors to remove the dressing. Sharp objects may damage the catheter. Wash the skin around the insertion site with mild, fragrance-free soap and warm water. Rinse well, then pat the area dry with a clean cloth. Check the skin around the catheter for signs of infection. Check for: Skin breakdown. Redness, swelling, or pain. Fluid or blood. Warmth. Pus or a bad smell. If your catheter was stitched  (sutured) to your skin, look at the suture to make sure it is still anchored in your skin. Do not apply creams, ointments, or alcohol to the area. Let your skin air-dry completely before you apply a new dressing. Curl the catheter into loops and place it on the sterile catheter pad. Do not place the catheter on your skin. If you do not have a pad, use a clean dressing. Slide the dressing under the disk that holds the drainage catheter in place. Use gauze to cover the catheter and the catheter pad. The catheter should rest on the pad or dressing, not on your skin. Tape the dressing to your skin. You may be instructed to use an adhesive dressing covering instead of gauze and tape. Wash your hands with soap and warm water. If soap and water are not available, use hand sanitizer. General recommendations Always wash your hands with soap and warm water before and after caring for your catheter and drainage bottle. Use a mild, fragrance-free soap. If soap and water are not available, use hand sanitizer. Always make sure there are no leaks in the catheter or drainage bottle. Each time you drain the catheter, note the color and amount of fluid. Do not touch the tip of the catheter or the drainage bottle tubing. Do not reuse drainage bottles. Do not take baths, swim, or use a hot tub until your health care provider approves. Ask your health care provider if you may take showers. You may only be allowed to take sponge baths. Take deep breaths regularly, followed by a cough. Doing this can help to prevent lung infection. Contact a health care provider if: You have any questions about caring for your catheter or drainage bottle. You still have pain at the catheter insertion site more than 2 days after your procedure. You have pain while draining your catheter. Your catheter becomes bent, twisted, or cracked. The connection between the catheter and the collection bottle becomes loose. You have any of these  around your catheter insertion site or coming from it: Skin breakdown. Redness, swelling, or pain. Fluid or blood. Warmth. Pus or a bad smell. Get help right away if: You have a fever or chills. You have chest pain. You have dizziness or shortness of breath. You have severe redness, swelling, or pain at your catheter insertion site. The catheter comes out. The catheter is blocked or clogged. Summary An indwelling pleural catheter is a thin, flexible tube that is inserted under your skin and into your chest. The catheter drains excess fluid that collects in the area between the chest wall and the lungs (pleural space). It is important to follow instructions from your health care provider about how to drain and care for your catheter. Do not touch the tip of the catheter or the drainage bottle  tubing. Always wash your hands with soap and water before and after caring for your catheter and drainage bottle. If soap and water are not available, use hand sanitizer. This information is not intended to replace advice given to you by your health care provider. Make sure you discuss any questions you have with your health care provider. Document Revised: 03/09/2019 Document Reviewed: 03/10/2017 Elsevier Patient Education  [place]. Indwelling Pleural Catheter Home Guide (Pleurex Drain)  An indwelling pleural catheter is a thin, flexible tube that is inserted under your skin and into your chest. The catheter drains excess fluid that collects in the area between the chest wall and the lungs (pleural space). After the catheter is inserted, it can be attached to a bottle that collects fluid. The pleural catheter will allow you to drain fluid from your chest at home on a regular basis (sometimes daily). This will eliminate the need for frequent visits to the hospital or clinic to drain the fluid. The catheter may be removed after the excess fluid problem is resolved, usually after 2-3 months. It  is important to follow instructions from your health care provider about how to drain and care for your catheter. What are the risks? Generally, this is a safe procedure. However, problems may occur, including: Infection. Skin damage around the catheter. Lung damage. Failure of the chest tube to work properly. Spreading of cancer cells along the catheter, if you have cancer. Supplies needed: Vacuum-sealed drainage bottle with attached drainage line. Sterile dressing. Sterile alcohol pads. Sterile gloves. Valve cap. Sterile gauze pads, 4  4 inch (10 cm  10 cm). Tape. Adhesive dressing. Sterile foam catheter pad. How to care for your catheter and insertion site Wash your hands with soap and warm water before and after touching the catheter or insertion site. If soap and water are not available, use hand sanitizer. Check your bandage (dressing) daily to make sure it is clean and dry. Keep the skin around the catheter clean and dry. Check the catheter regularly for any cracks or kinks in the tubing. Check your catheter insertion site every day for signs of infection. Check for: Skin breakdown. Redness, swelling, or pain. Fluid or blood. Warmth. Pus or a bad smell. How to drain your catheter You may need to drain your catheter every day, or more or less often as told by your health care provider. Follow instructions from your health care provider about how to drain your catheter. You may also refer to instructions that come with the drainage system. To drain the catheter: Wash your hands with soap and warm water. If soap and water are not available, use hand sanitizer. Carefully remove the dressing from around the catheter. Wash your hands again. Put on the gloves provided. Prepare the vacuum-sealed drainage bottle and drainage line. Close the drainage line of the vacuum-sealed drainage bottle by squeezing the pinch clamp or rolling the wheel of the roller clamp toward the bottle. The  vacuum in the bottle will be lost if the line is not closed completely. Remove the access tip cover from the drainage line. Do not touch the end. Set it on a sterile surface. Remove the catheter valve cap and throw it away. Use an alcohol pad to clean the end of the catheter. Insert the access tip into the catheter valve. Make sure the valve and access tip are securely connected. Listen for a click to confirm that they are connected. Insert the T plunger to break the vacuum seal on  the drainage bottle. Open the clamp on the drainage line. Allow the catheter to drain. Keep the catheter and the drainage bottle below the level of your chest. There may be a one-way valve on the end of the tubing that will allow liquid and air to flow out of the catheter without letting air inside. Drain the amount of fluid as told by your health care provider. It usually takes 5-15 minutes. Do not drain more than 1000 mL of fluid. You may feel a little discomfort while you are draining. If the pain is severe, stop draining and contact your health care provider. After you finish draining the catheter, remove the drainage bottle tubing from the catheter. Use a clean alcohol pad to wipe the catheter tip. Place a clean cap on the end of the catheter. Use an alcohol pad to clean the skin around the catheter. Allow the skin to air-dry. Put the catheter pad on your skin. Curl the catheter into loops and place it on the pad. Do not place the catheter on your skin. Replace the dressing over the catheter. Discard the drainage bottle as instructed by your health care provider. Do not reuse the drainage bottle. How to change your dressing Change your dressing at least once a week, or more often if needed to keep the dressing dry. Be sure to change the dressing whenever it becomes moist. Your health care provider will tell you how often to change your dressing. Wash your hands with soap and warm water. If soap and water are not  available, use hand sanitizer. Gently remove the old dressing. Avoid using scissors to remove the dressing. Sharp objects may damage the catheter. Wash the skin around the insertion site with mild, fragrance-free soap and warm water. Rinse well, then pat the area dry with a clean cloth. Check the skin around the catheter for signs of infection. Check for: Skin breakdown. Redness, swelling, or pain. Fluid or blood. Warmth. Pus or a bad smell. If your catheter was stitched (sutured) to your skin, look at the suture to make sure it is still anchored in your skin. Do not apply creams, ointments, or alcohol to the area. Let your skin air-dry completely before you apply a new dressing. Curl the catheter into loops and place it on the sterile catheter pad. Do not place the catheter on your skin. If you do not have a pad, use a clean dressing. Slide the dressing under the disk that holds the drainage catheter in place. Use gauze to cover the catheter and the catheter pad. The catheter should rest on the pad or dressing, not on your skin. Tape the dressing to your skin. You may be instructed to use an adhesive dressing covering instead of gauze and tape. Wash your hands with soap and warm water. If soap and water are not available, use hand sanitizer. General recommendations Always wash your hands with soap and warm water before and after caring for your catheter and drainage bottle. Use a mild, fragrance-free soap. If soap and water are not available, use hand sanitizer. Always make sure there are no leaks in the catheter or drainage bottle. Each time you drain the catheter, note the color and amount of fluid. Do not touch the tip of the catheter or the drainage bottle tubing. Do not reuse drainage bottles. Do not take baths, swim, or use a hot tub until your health care provider approves. Ask your health care provider if you may take showers. You may only be  allowed to take sponge baths. Take deep  breaths regularly, followed by a cough. Doing this can help to prevent lung infection. Contact a health care provider if: You have any questions about caring for your catheter or drainage bottle. You still have pain at the catheter insertion site more than 2 days after your procedure. You have pain while draining your catheter. Your catheter becomes bent, twisted, or cracked. The connection between the catheter and the collection bottle becomes loose. You have any of these around your catheter insertion site or coming from it: Skin breakdown. Redness, swelling, or pain. Fluid or blood. Warmth. Pus or a bad smell. Get help right away if: You have a fever or chills. You have chest pain. You have dizziness or shortness of breath. You have severe redness, swelling, or pain at your catheter insertion site. The catheter comes out. The catheter is blocked or clogged. Summary An indwelling pleural catheter is a thin, flexible tube that is inserted under your skin and into your chest. The catheter drains excess fluid that collects in the area between the chest wall and the lungs (pleural space). It is important to follow instructions from your health care provider about how to drain and care for your catheter. Do not touch the tip of the catheter or the drainage bottle tubing. Always wash your hands with soap and water before and after caring for your catheter and drainage bottle. If soap and water are not available, use hand sanitizer. This information is not intended to replace advice given to you by your health care provider. Make sure you discuss any questions you have with your health care provider. Document Revised: 03/09/2019 Document Reviewed: 03/10/2017 Elsevier Patient Education  [place].

## 2022-11-09 NOTE — Procedures (Signed)
Interventional Radiology Procedure Note  Procedure: US guided right  PleurX catheter placement  Indication: Recurrent malignant pleural effusion  Findings: Please refer to procedural dictation for full description.  Complications: None  EBL: < 10 mL  Miachel Roux, MD [phone]

## 2022-11-10 DIAGNOSIS — Z8619 Personal history of other infectious and parasitic diseases: Secondary | ICD-10-CM | POA: Diagnosis not present

## 2022-11-10 DIAGNOSIS — D61818 Other pancytopenia: Secondary | ICD-10-CM | POA: Diagnosis not present

## 2022-11-10 DIAGNOSIS — I700 Atherosclerosis of aorta: Secondary | ICD-10-CM | POA: Diagnosis not present

## 2022-11-10 DIAGNOSIS — D696 Thrombocytopenia, unspecified: Secondary | ICD-10-CM | POA: Diagnosis not present

## 2022-11-10 DIAGNOSIS — C50111 Malignant neoplasm of central portion of right female breast: Secondary | ICD-10-CM | POA: Diagnosis not present

## 2022-11-10 DIAGNOSIS — R188 Other ascites: Secondary | ICD-10-CM | POA: Diagnosis not present

## 2022-11-10 DIAGNOSIS — C782 Secondary malignant neoplasm of pleura: Secondary | ICD-10-CM | POA: Diagnosis not present

## 2022-11-10 DIAGNOSIS — Z8639 Personal history of other endocrine, nutritional and metabolic disease: Secondary | ICD-10-CM | POA: Diagnosis not present

## 2022-11-10 DIAGNOSIS — R161 Splenomegaly, not elsewhere classified: Secondary | ICD-10-CM | POA: Diagnosis not present

## 2022-11-10 DIAGNOSIS — Z438 Encounter for attention to other artificial openings: Secondary | ICD-10-CM | POA: Diagnosis not present

## 2022-11-10 DIAGNOSIS — Z452 Encounter for adjustment and management of vascular access device: Secondary | ICD-10-CM | POA: Diagnosis not present

## 2022-11-10 DIAGNOSIS — J90 Pleural effusion, not elsewhere classified: Secondary | ICD-10-CM | POA: Diagnosis not present

## 2022-11-10 DIAGNOSIS — Z681 Body mass index (BMI) 19 or less, adult: Secondary | ICD-10-CM | POA: Diagnosis not present

## 2022-11-10 DIAGNOSIS — C220 Liver cell carcinoma: Secondary | ICD-10-CM | POA: Diagnosis not present

## 2022-11-10 DIAGNOSIS — E43 Unspecified severe protein-calorie malnutrition: Secondary | ICD-10-CM | POA: Diagnosis not present

## 2022-11-10 DIAGNOSIS — K766 Portal hypertension: Secondary | ICD-10-CM | POA: Diagnosis not present

## 2022-11-10 LAB — PT FACTOR INHIBITOR (MIXING STUDY)
1 HR INCUB PT 1:1NP: 11 s (ref 9.1–12.0)
PT 1:1NP: 10.3 s (ref 9.1–12.0)
PT: 12.9 s — ABNORMAL HIGH (ref 9.1–12.0)

## 2022-11-11 DIAGNOSIS — Z452 Encounter for adjustment and management of vascular access device: Secondary | ICD-10-CM | POA: Diagnosis not present

## 2022-11-11 DIAGNOSIS — D61818 Other pancytopenia: Secondary | ICD-10-CM | POA: Diagnosis not present

## 2022-11-11 DIAGNOSIS — Z681 Body mass index (BMI) 19 or less, adult: Secondary | ICD-10-CM | POA: Diagnosis not present

## 2022-11-11 DIAGNOSIS — C782 Secondary malignant neoplasm of pleura: Secondary | ICD-10-CM | POA: Diagnosis not present

## 2022-11-11 DIAGNOSIS — Z8619 Personal history of other infectious and parasitic diseases: Secondary | ICD-10-CM | POA: Diagnosis not present

## 2022-11-11 DIAGNOSIS — K766 Portal hypertension: Secondary | ICD-10-CM | POA: Diagnosis not present

## 2022-11-11 DIAGNOSIS — Z438 Encounter for attention to other artificial openings: Secondary | ICD-10-CM | POA: Diagnosis not present

## 2022-11-11 DIAGNOSIS — R161 Splenomegaly, not elsewhere classified: Secondary | ICD-10-CM | POA: Diagnosis not present

## 2022-11-11 DIAGNOSIS — J90 Pleural effusion, not elsewhere classified: Secondary | ICD-10-CM | POA: Diagnosis not present

## 2022-11-11 DIAGNOSIS — D696 Thrombocytopenia, unspecified: Secondary | ICD-10-CM | POA: Diagnosis not present

## 2022-11-11 DIAGNOSIS — C220 Liver cell carcinoma: Secondary | ICD-10-CM | POA: Diagnosis not present

## 2022-11-11 DIAGNOSIS — C50111 Malignant neoplasm of central portion of right female breast: Secondary | ICD-10-CM | POA: Diagnosis not present

## 2022-11-11 DIAGNOSIS — R188 Other ascites: Secondary | ICD-10-CM | POA: Diagnosis not present

## 2022-11-11 DIAGNOSIS — Z8639 Personal history of other endocrine, nutritional and metabolic disease: Secondary | ICD-10-CM | POA: Diagnosis not present

## 2022-11-11 DIAGNOSIS — I700 Atherosclerosis of aorta: Secondary | ICD-10-CM | POA: Diagnosis not present

## 2022-11-11 DIAGNOSIS — E43 Unspecified severe protein-calorie malnutrition: Secondary | ICD-10-CM | POA: Diagnosis not present

## 2022-11-12 DIAGNOSIS — R161 Splenomegaly, not elsewhere classified: Secondary | ICD-10-CM | POA: Diagnosis not present

## 2022-11-12 DIAGNOSIS — D61818 Other pancytopenia: Secondary | ICD-10-CM | POA: Diagnosis not present

## 2022-11-12 DIAGNOSIS — C50111 Malignant neoplasm of central portion of right female breast: Secondary | ICD-10-CM | POA: Diagnosis not present

## 2022-11-12 DIAGNOSIS — I700 Atherosclerosis of aorta: Secondary | ICD-10-CM | POA: Diagnosis not present

## 2022-11-12 DIAGNOSIS — Z8619 Personal history of other infectious and parasitic diseases: Secondary | ICD-10-CM | POA: Diagnosis not present

## 2022-11-12 DIAGNOSIS — K766 Portal hypertension: Secondary | ICD-10-CM | POA: Diagnosis not present

## 2022-11-12 DIAGNOSIS — D696 Thrombocytopenia, unspecified: Secondary | ICD-10-CM | POA: Diagnosis not present

## 2022-11-12 DIAGNOSIS — C220 Liver cell carcinoma: Secondary | ICD-10-CM | POA: Diagnosis not present

## 2022-11-12 DIAGNOSIS — Z681 Body mass index (BMI) 19 or less, adult: Secondary | ICD-10-CM | POA: Diagnosis not present

## 2022-11-12 DIAGNOSIS — E43 Unspecified severe protein-calorie malnutrition: Secondary | ICD-10-CM | POA: Diagnosis not present

## 2022-11-12 DIAGNOSIS — Z438 Encounter for attention to other artificial openings: Secondary | ICD-10-CM | POA: Diagnosis not present

## 2022-11-12 DIAGNOSIS — Z8639 Personal history of other endocrine, nutritional and metabolic disease: Secondary | ICD-10-CM | POA: Diagnosis not present

## 2022-11-12 DIAGNOSIS — C782 Secondary malignant neoplasm of pleura: Secondary | ICD-10-CM | POA: Diagnosis not present

## 2022-11-12 DIAGNOSIS — R188 Other ascites: Secondary | ICD-10-CM | POA: Diagnosis not present

## 2022-11-12 DIAGNOSIS — Z452 Encounter for adjustment and management of vascular access device: Secondary | ICD-10-CM | POA: Diagnosis not present

## 2022-11-12 DIAGNOSIS — J90 Pleural effusion, not elsewhere classified: Secondary | ICD-10-CM | POA: Diagnosis not present

## 2022-11-13 DIAGNOSIS — Z8619 Personal history of other infectious and parasitic diseases: Secondary | ICD-10-CM | POA: Diagnosis not present

## 2022-11-13 DIAGNOSIS — Z452 Encounter for adjustment and management of vascular access device: Secondary | ICD-10-CM | POA: Diagnosis not present

## 2022-11-13 DIAGNOSIS — Z8639 Personal history of other endocrine, nutritional and metabolic disease: Secondary | ICD-10-CM | POA: Diagnosis not present

## 2022-11-13 DIAGNOSIS — D61818 Other pancytopenia: Secondary | ICD-10-CM | POA: Diagnosis not present

## 2022-11-13 DIAGNOSIS — K766 Portal hypertension: Secondary | ICD-10-CM | POA: Diagnosis not present

## 2022-11-13 DIAGNOSIS — C50111 Malignant neoplasm of central portion of right female breast: Secondary | ICD-10-CM | POA: Diagnosis not present

## 2022-11-13 DIAGNOSIS — E43 Unspecified severe protein-calorie malnutrition: Secondary | ICD-10-CM | POA: Diagnosis not present

## 2022-11-13 DIAGNOSIS — J90 Pleural effusion, not elsewhere classified: Secondary | ICD-10-CM | POA: Diagnosis not present

## 2022-11-13 DIAGNOSIS — Z438 Encounter for attention to other artificial openings: Secondary | ICD-10-CM | POA: Diagnosis not present

## 2022-11-13 DIAGNOSIS — I700 Atherosclerosis of aorta: Secondary | ICD-10-CM | POA: Diagnosis not present

## 2022-11-13 DIAGNOSIS — C782 Secondary malignant neoplasm of pleura: Secondary | ICD-10-CM | POA: Diagnosis not present

## 2022-11-13 DIAGNOSIS — C220 Liver cell carcinoma: Secondary | ICD-10-CM | POA: Diagnosis not present

## 2022-11-13 DIAGNOSIS — R188 Other ascites: Secondary | ICD-10-CM | POA: Diagnosis not present

## 2022-11-13 DIAGNOSIS — D696 Thrombocytopenia, unspecified: Secondary | ICD-10-CM | POA: Diagnosis not present

## 2022-11-13 DIAGNOSIS — R161 Splenomegaly, not elsewhere classified: Secondary | ICD-10-CM | POA: Diagnosis not present

## 2022-11-13 DIAGNOSIS — Z681 Body mass index (BMI) 19 or less, adult: Secondary | ICD-10-CM | POA: Diagnosis not present

## 2022-11-14 DIAGNOSIS — I700 Atherosclerosis of aorta: Secondary | ICD-10-CM | POA: Diagnosis not present

## 2022-11-14 DIAGNOSIS — C220 Liver cell carcinoma: Secondary | ICD-10-CM | POA: Diagnosis not present

## 2022-11-14 DIAGNOSIS — C50111 Malignant neoplasm of central portion of right female breast: Secondary | ICD-10-CM | POA: Diagnosis not present

## 2022-11-14 DIAGNOSIS — D696 Thrombocytopenia, unspecified: Secondary | ICD-10-CM | POA: Diagnosis not present

## 2022-11-14 DIAGNOSIS — D61818 Other pancytopenia: Secondary | ICD-10-CM | POA: Diagnosis not present

## 2022-11-14 DIAGNOSIS — Z8639 Personal history of other endocrine, nutritional and metabolic disease: Secondary | ICD-10-CM | POA: Diagnosis not present

## 2022-11-14 DIAGNOSIS — Z681 Body mass index (BMI) 19 or less, adult: Secondary | ICD-10-CM | POA: Diagnosis not present

## 2022-11-14 DIAGNOSIS — E43 Unspecified severe protein-calorie malnutrition: Secondary | ICD-10-CM | POA: Diagnosis not present

## 2022-11-14 DIAGNOSIS — Z438 Encounter for attention to other artificial openings: Secondary | ICD-10-CM | POA: Diagnosis not present

## 2022-11-14 DIAGNOSIS — Z8619 Personal history of other infectious and parasitic diseases: Secondary | ICD-10-CM | POA: Diagnosis not present

## 2022-11-14 DIAGNOSIS — R161 Splenomegaly, not elsewhere classified: Secondary | ICD-10-CM | POA: Diagnosis not present

## 2022-11-14 DIAGNOSIS — C782 Secondary malignant neoplasm of pleura: Secondary | ICD-10-CM | POA: Diagnosis not present

## 2022-11-14 DIAGNOSIS — R188 Other ascites: Secondary | ICD-10-CM | POA: Diagnosis not present

## 2022-11-14 DIAGNOSIS — K766 Portal hypertension: Secondary | ICD-10-CM | POA: Diagnosis not present

## 2022-11-14 DIAGNOSIS — J90 Pleural effusion, not elsewhere classified: Secondary | ICD-10-CM | POA: Diagnosis not present

## 2022-11-14 DIAGNOSIS — Z452 Encounter for adjustment and management of vascular access device: Secondary | ICD-10-CM | POA: Diagnosis not present

## 2022-11-15 DIAGNOSIS — Z681 Body mass index (BMI) 19 or less, adult: Secondary | ICD-10-CM | POA: Diagnosis not present

## 2022-11-15 DIAGNOSIS — R161 Splenomegaly, not elsewhere classified: Secondary | ICD-10-CM | POA: Diagnosis not present

## 2022-11-15 DIAGNOSIS — C50111 Malignant neoplasm of central portion of right female breast: Secondary | ICD-10-CM | POA: Diagnosis not present

## 2022-11-15 DIAGNOSIS — D696 Thrombocytopenia, unspecified: Secondary | ICD-10-CM | POA: Diagnosis not present

## 2022-11-15 DIAGNOSIS — C220 Liver cell carcinoma: Secondary | ICD-10-CM | POA: Diagnosis not present

## 2022-11-15 DIAGNOSIS — Z452 Encounter for adjustment and management of vascular access device: Secondary | ICD-10-CM | POA: Diagnosis not present

## 2022-11-15 DIAGNOSIS — Z8619 Personal history of other infectious and parasitic diseases: Secondary | ICD-10-CM | POA: Diagnosis not present

## 2022-11-15 DIAGNOSIS — R188 Other ascites: Secondary | ICD-10-CM | POA: Diagnosis not present

## 2022-11-15 DIAGNOSIS — C782 Secondary malignant neoplasm of pleura: Secondary | ICD-10-CM | POA: Diagnosis not present

## 2022-11-15 DIAGNOSIS — I700 Atherosclerosis of aorta: Secondary | ICD-10-CM | POA: Diagnosis not present

## 2022-11-15 DIAGNOSIS — Z438 Encounter for attention to other artificial openings: Secondary | ICD-10-CM | POA: Diagnosis not present

## 2022-11-15 DIAGNOSIS — D61818 Other pancytopenia: Secondary | ICD-10-CM | POA: Diagnosis not present

## 2022-11-15 DIAGNOSIS — E43 Unspecified severe protein-calorie malnutrition: Secondary | ICD-10-CM | POA: Diagnosis not present

## 2022-11-15 DIAGNOSIS — Z8639 Personal history of other endocrine, nutritional and metabolic disease: Secondary | ICD-10-CM | POA: Diagnosis not present

## 2022-11-15 DIAGNOSIS — J90 Pleural effusion, not elsewhere classified: Secondary | ICD-10-CM | POA: Diagnosis not present

## 2022-11-15 DIAGNOSIS — K766 Portal hypertension: Secondary | ICD-10-CM | POA: Diagnosis not present

## 2022-11-16 DIAGNOSIS — I700 Atherosclerosis of aorta: Secondary | ICD-10-CM | POA: Diagnosis not present

## 2022-11-16 DIAGNOSIS — C220 Liver cell carcinoma: Secondary | ICD-10-CM | POA: Diagnosis not present

## 2022-11-16 DIAGNOSIS — Z438 Encounter for attention to other artificial openings: Secondary | ICD-10-CM | POA: Diagnosis not present

## 2022-11-16 DIAGNOSIS — C782 Secondary malignant neoplasm of pleura: Secondary | ICD-10-CM | POA: Diagnosis not present

## 2022-11-16 DIAGNOSIS — Z681 Body mass index (BMI) 19 or less, adult: Secondary | ICD-10-CM | POA: Diagnosis not present

## 2022-11-16 DIAGNOSIS — Z452 Encounter for adjustment and management of vascular access device: Secondary | ICD-10-CM | POA: Diagnosis not present

## 2022-11-16 DIAGNOSIS — E43 Unspecified severe protein-calorie malnutrition: Secondary | ICD-10-CM | POA: Diagnosis not present

## 2022-11-16 DIAGNOSIS — R161 Splenomegaly, not elsewhere classified: Secondary | ICD-10-CM | POA: Diagnosis not present

## 2022-11-16 DIAGNOSIS — R188 Other ascites: Secondary | ICD-10-CM | POA: Diagnosis not present

## 2022-11-16 DIAGNOSIS — Z8639 Personal history of other endocrine, nutritional and metabolic disease: Secondary | ICD-10-CM | POA: Diagnosis not present

## 2022-11-16 DIAGNOSIS — J90 Pleural effusion, not elsewhere classified: Secondary | ICD-10-CM | POA: Diagnosis not present

## 2022-11-16 DIAGNOSIS — K766 Portal hypertension: Secondary | ICD-10-CM | POA: Diagnosis not present

## 2022-11-16 DIAGNOSIS — D696 Thrombocytopenia, unspecified: Secondary | ICD-10-CM | POA: Diagnosis not present

## 2022-11-16 DIAGNOSIS — Z8619 Personal history of other infectious and parasitic diseases: Secondary | ICD-10-CM | POA: Diagnosis not present

## 2022-11-16 DIAGNOSIS — C50111 Malignant neoplasm of central portion of right female breast: Secondary | ICD-10-CM | POA: Diagnosis not present

## 2022-11-16 DIAGNOSIS — D61818 Other pancytopenia: Secondary | ICD-10-CM | POA: Diagnosis not present

## 2022-11-17 DIAGNOSIS — Z8639 Personal history of other endocrine, nutritional and metabolic disease: Secondary | ICD-10-CM | POA: Diagnosis not present

## 2022-11-17 DIAGNOSIS — R188 Other ascites: Secondary | ICD-10-CM | POA: Diagnosis not present

## 2022-11-17 DIAGNOSIS — C782 Secondary malignant neoplasm of pleura: Secondary | ICD-10-CM | POA: Diagnosis not present

## 2022-11-17 DIAGNOSIS — D696 Thrombocytopenia, unspecified: Secondary | ICD-10-CM | POA: Diagnosis not present

## 2022-11-17 DIAGNOSIS — Z438 Encounter for attention to other artificial openings: Secondary | ICD-10-CM | POA: Diagnosis not present

## 2022-11-17 DIAGNOSIS — Z681 Body mass index (BMI) 19 or less, adult: Secondary | ICD-10-CM | POA: Diagnosis not present

## 2022-11-17 DIAGNOSIS — R161 Splenomegaly, not elsewhere classified: Secondary | ICD-10-CM | POA: Diagnosis not present

## 2022-11-17 DIAGNOSIS — Z452 Encounter for adjustment and management of vascular access device: Secondary | ICD-10-CM | POA: Diagnosis not present

## 2022-11-17 DIAGNOSIS — C50111 Malignant neoplasm of central portion of right female breast: Secondary | ICD-10-CM | POA: Diagnosis not present

## 2022-11-17 DIAGNOSIS — C220 Liver cell carcinoma: Secondary | ICD-10-CM | POA: Diagnosis not present

## 2022-11-17 DIAGNOSIS — E43 Unspecified severe protein-calorie malnutrition: Secondary | ICD-10-CM | POA: Diagnosis not present

## 2022-11-17 DIAGNOSIS — D61818 Other pancytopenia: Secondary | ICD-10-CM | POA: Diagnosis not present

## 2022-11-17 DIAGNOSIS — K766 Portal hypertension: Secondary | ICD-10-CM | POA: Diagnosis not present

## 2022-11-17 DIAGNOSIS — I700 Atherosclerosis of aorta: Secondary | ICD-10-CM | POA: Diagnosis not present

## 2022-11-17 DIAGNOSIS — J90 Pleural effusion, not elsewhere classified: Secondary | ICD-10-CM | POA: Diagnosis not present

## 2022-11-17 DIAGNOSIS — Z8619 Personal history of other infectious and parasitic diseases: Secondary | ICD-10-CM | POA: Diagnosis not present

## 2022-11-18 DIAGNOSIS — Z452 Encounter for adjustment and management of vascular access device: Secondary | ICD-10-CM | POA: Diagnosis not present

## 2022-11-18 DIAGNOSIS — Z8619 Personal history of other infectious and parasitic diseases: Secondary | ICD-10-CM | POA: Diagnosis not present

## 2022-11-18 DIAGNOSIS — E43 Unspecified severe protein-calorie malnutrition: Secondary | ICD-10-CM | POA: Diagnosis not present

## 2022-11-18 DIAGNOSIS — Z681 Body mass index (BMI) 19 or less, adult: Secondary | ICD-10-CM | POA: Diagnosis not present

## 2022-11-18 DIAGNOSIS — J90 Pleural effusion, not elsewhere classified: Secondary | ICD-10-CM | POA: Diagnosis not present

## 2022-11-18 DIAGNOSIS — K766 Portal hypertension: Secondary | ICD-10-CM | POA: Diagnosis not present

## 2022-11-18 DIAGNOSIS — R188 Other ascites: Secondary | ICD-10-CM | POA: Diagnosis not present

## 2022-11-18 DIAGNOSIS — R161 Splenomegaly, not elsewhere classified: Secondary | ICD-10-CM | POA: Diagnosis not present

## 2022-11-18 DIAGNOSIS — C220 Liver cell carcinoma: Secondary | ICD-10-CM | POA: Diagnosis not present

## 2022-11-18 DIAGNOSIS — C782 Secondary malignant neoplasm of pleura: Secondary | ICD-10-CM | POA: Diagnosis not present

## 2022-11-18 DIAGNOSIS — C50111 Malignant neoplasm of central portion of right female breast: Secondary | ICD-10-CM | POA: Diagnosis not present

## 2022-11-18 DIAGNOSIS — Z8639 Personal history of other endocrine, nutritional and metabolic disease: Secondary | ICD-10-CM | POA: Diagnosis not present

## 2022-11-18 DIAGNOSIS — Z438 Encounter for attention to other artificial openings: Secondary | ICD-10-CM | POA: Diagnosis not present

## 2022-11-18 DIAGNOSIS — D61818 Other pancytopenia: Secondary | ICD-10-CM | POA: Diagnosis not present

## 2022-11-18 DIAGNOSIS — I700 Atherosclerosis of aorta: Secondary | ICD-10-CM | POA: Diagnosis not present

## 2022-11-18 DIAGNOSIS — D696 Thrombocytopenia, unspecified: Secondary | ICD-10-CM | POA: Diagnosis not present

## 2022-11-29 DEATH — deceased
